# Patient Record
Sex: Female | Born: 1955
Health system: Southern US, Community
[De-identification: ages and names within clinical notes are randomized; demographics above are authoritative.]

## PROBLEM LIST (undated history)

## (undated) DIAGNOSIS — T4145XA Adverse effect of unspecified anesthetic, initial encounter: Secondary | ICD-10-CM

## (undated) DIAGNOSIS — M549 Dorsalgia, unspecified: Secondary | ICD-10-CM

## (undated) DIAGNOSIS — E669 Obesity, unspecified: Secondary | ICD-10-CM

## (undated) DIAGNOSIS — J40 Bronchitis, not specified as acute or chronic: Secondary | ICD-10-CM

## (undated) DIAGNOSIS — G629 Polyneuropathy, unspecified: Secondary | ICD-10-CM

## (undated) DIAGNOSIS — I10 Essential (primary) hypertension: Secondary | ICD-10-CM

## (undated) DIAGNOSIS — Z8739 Personal history of other diseases of the musculoskeletal system and connective tissue: Secondary | ICD-10-CM

## (undated) DIAGNOSIS — G4733 Obstructive sleep apnea (adult) (pediatric): Secondary | ICD-10-CM

## (undated) DIAGNOSIS — E785 Hyperlipidemia, unspecified: Secondary | ICD-10-CM

## (undated) DIAGNOSIS — F419 Anxiety disorder, unspecified: Secondary | ICD-10-CM

## (undated) DIAGNOSIS — R42 Dizziness and giddiness: Secondary | ICD-10-CM

## (undated) DIAGNOSIS — R011 Cardiac murmur, unspecified: Secondary | ICD-10-CM

## (undated) HISTORY — PX: ABDOMINAL HYSTERECTOMY: SHX81

## (undated) HISTORY — DX: Dizziness and giddiness: R42

## (undated) HISTORY — DX: Essential (primary) hypertension: I10

## (undated) HISTORY — PX: TONSILLECTOMY: SUR1361

## (undated) HISTORY — DX: Obesity, unspecified: E66.9

## (undated) HISTORY — DX: Obstructive sleep apnea (adult) (pediatric): G47.33

## (undated) HISTORY — PX: DILATION AND CURETTAGE OF UTERUS: SHX78

## (undated) HISTORY — DX: Hyperlipidemia, unspecified: E78.5

## (undated) HISTORY — PX: ADENOIDECTOMY: SUR15

## (undated) HISTORY — PX: CATARACT EXTRACTION: SUR2

## (undated) HISTORY — PX: ENDOMETRIAL ABLATION: SHX621

## (undated) HISTORY — DX: Dorsalgia, unspecified: M54.9

---

## 2000-09-14 ENCOUNTER — Encounter: Payer: Self-pay | Admitting: Obstetrics and Gynecology

## 2000-09-14 ENCOUNTER — Ambulatory Visit (HOSPITAL_COMMUNITY): Admission: RE | Admit: 2000-09-14 | Discharge: 2000-09-14 | Payer: Self-pay | Admitting: Obstetrics and Gynecology

## 2000-09-21 ENCOUNTER — Other Ambulatory Visit: Admission: RE | Admit: 2000-09-21 | Discharge: 2000-09-21 | Payer: Self-pay | Admitting: Obstetrics and Gynecology

## 2001-03-02 ENCOUNTER — Emergency Department (HOSPITAL_COMMUNITY): Admission: EM | Admit: 2001-03-02 | Discharge: 2001-03-03 | Payer: Self-pay | Admitting: Internal Medicine

## 2001-05-30 ENCOUNTER — Ambulatory Visit (HOSPITAL_BASED_OUTPATIENT_CLINIC_OR_DEPARTMENT_OTHER): Admission: RE | Admit: 2001-05-30 | Discharge: 2001-05-31 | Payer: Self-pay | Admitting: Otolaryngology

## 2001-07-19 ENCOUNTER — Encounter: Payer: Self-pay | Admitting: Otolaryngology

## 2001-07-21 ENCOUNTER — Ambulatory Visit (HOSPITAL_COMMUNITY): Admission: RE | Admit: 2001-07-21 | Discharge: 2001-07-22 | Payer: Self-pay | Admitting: Otolaryngology

## 2001-07-21 ENCOUNTER — Encounter (INDEPENDENT_AMBULATORY_CARE_PROVIDER_SITE_OTHER): Payer: Self-pay | Admitting: *Deleted

## 2001-10-07 ENCOUNTER — Ambulatory Visit (HOSPITAL_COMMUNITY): Admission: RE | Admit: 2001-10-07 | Discharge: 2001-10-07 | Payer: Self-pay | Admitting: Obstetrics and Gynecology

## 2001-10-07 ENCOUNTER — Encounter: Payer: Self-pay | Admitting: Obstetrics and Gynecology

## 2001-12-13 ENCOUNTER — Encounter: Admission: RE | Admit: 2001-12-13 | Discharge: 2002-03-13 | Payer: Self-pay | Admitting: Obstetrics and Gynecology

## 2002-11-24 ENCOUNTER — Encounter: Payer: Self-pay | Admitting: Obstetrics and Gynecology

## 2002-11-24 ENCOUNTER — Ambulatory Visit (HOSPITAL_COMMUNITY): Admission: RE | Admit: 2002-11-24 | Discharge: 2002-11-24 | Payer: Self-pay | Admitting: Obstetrics and Gynecology

## 2003-12-19 ENCOUNTER — Ambulatory Visit (HOSPITAL_COMMUNITY): Admission: RE | Admit: 2003-12-19 | Discharge: 2003-12-19 | Payer: Self-pay | Admitting: Obstetrics and Gynecology

## 2004-12-08 ENCOUNTER — Ambulatory Visit: Payer: Self-pay | Admitting: Family Medicine

## 2004-12-09 ENCOUNTER — Ambulatory Visit (HOSPITAL_COMMUNITY): Admission: RE | Admit: 2004-12-09 | Discharge: 2004-12-09 | Payer: Self-pay | Admitting: Family Medicine

## 2004-12-09 IMAGING — CR DG CHEST 2V
2 series · 2 of 2 positions shown · non-contrast
Comparison: none

HISTORY: Chronic cough, congestion, recent diagnosis hypertension

CHEST 2 VIEWS:
No prior exam available for comparison
Normal heart size, mediastinal contours, and vascularity.
Lungs clear.
Bones unremarkable.
No pleural effusion or pneumothorax.

[view not recorded (1 of 2)]
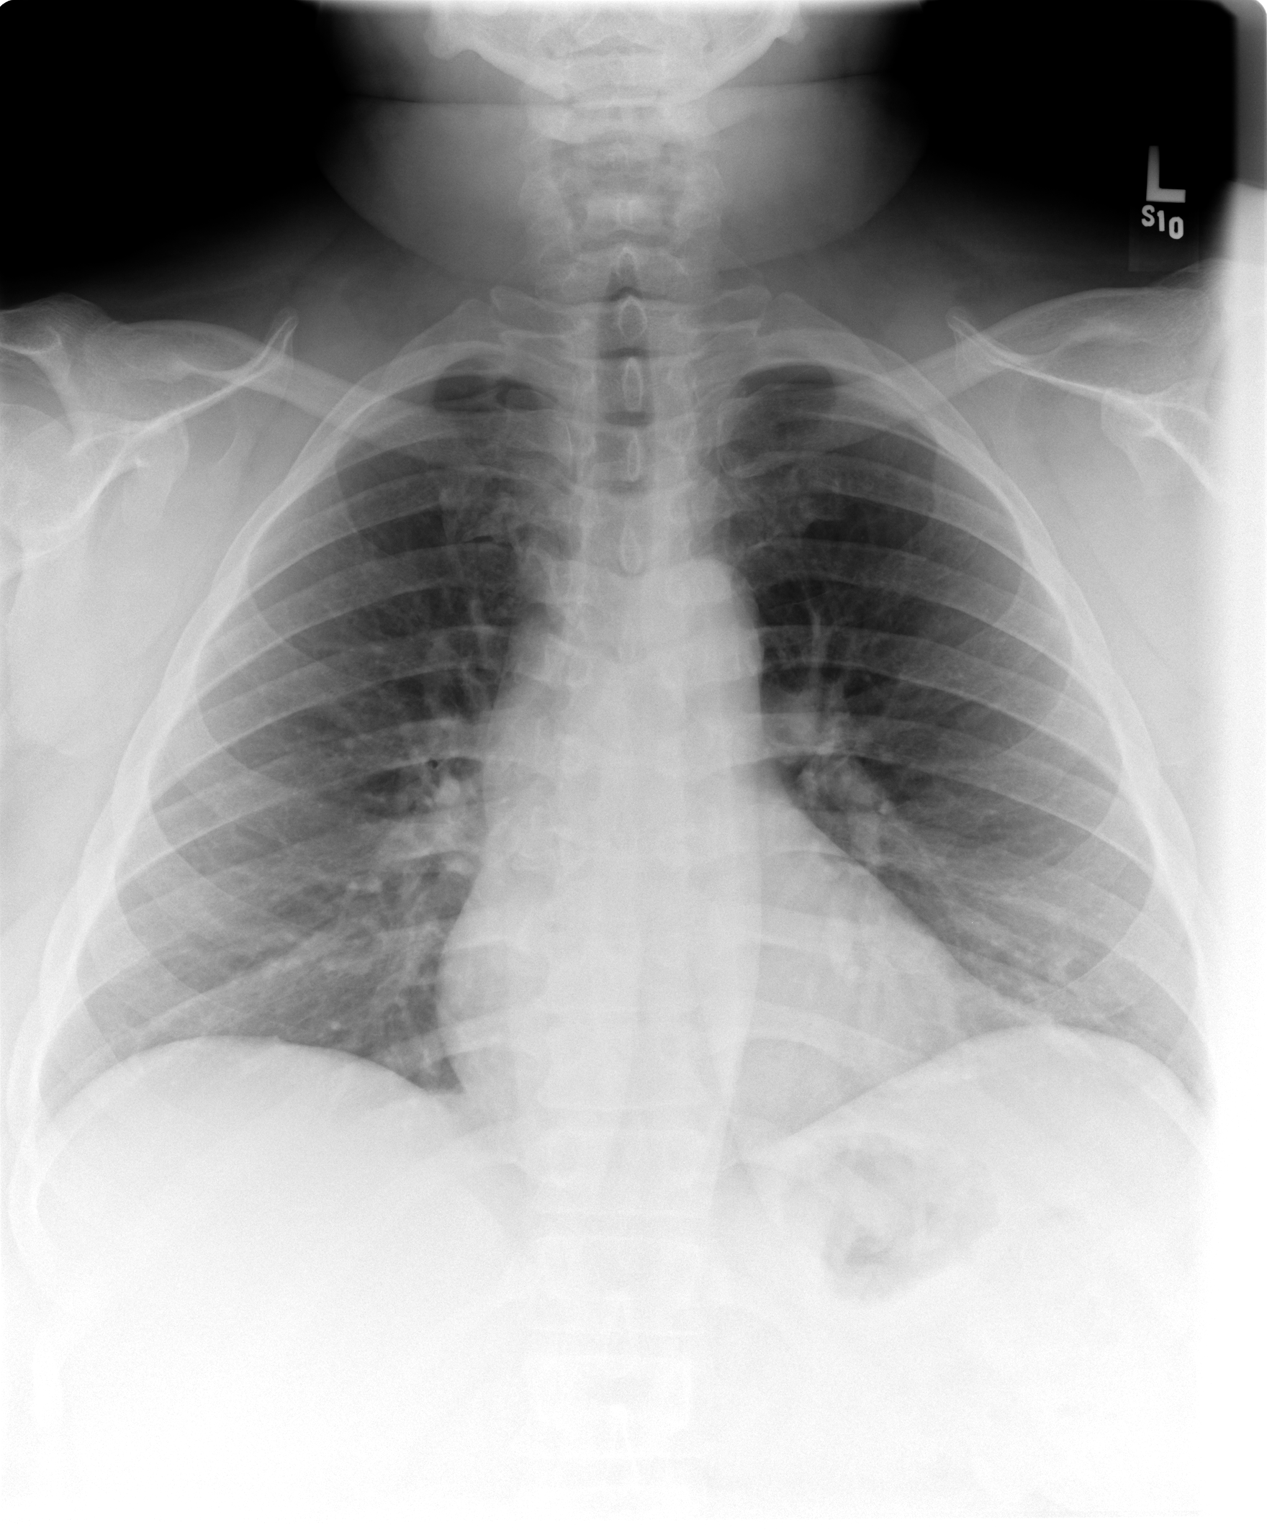

[view not recorded (2 of 2)]
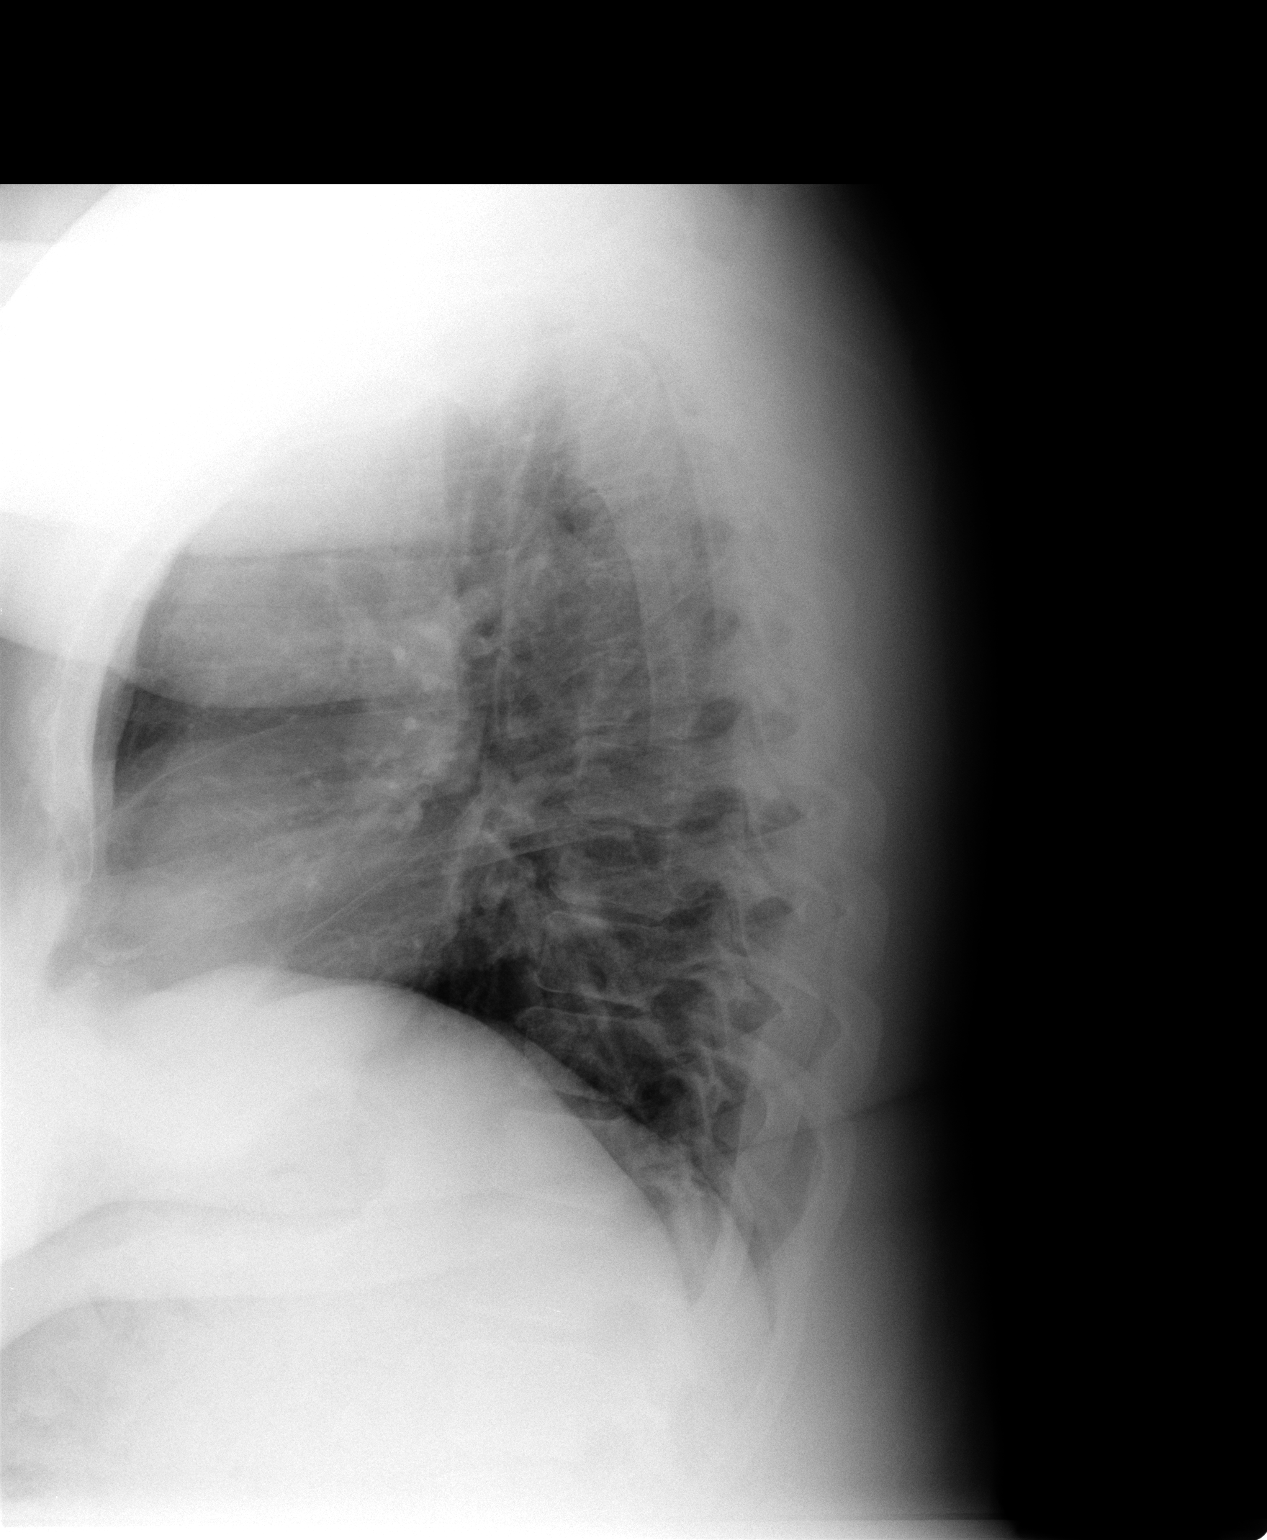

[2 of 2 positions shown; findings below may reference images not displayed]

IMPRESSION: No acute abnormalities.

## 2004-12-17 ENCOUNTER — Ambulatory Visit: Payer: Self-pay | Admitting: *Deleted

## 2004-12-19 ENCOUNTER — Encounter (HOSPITAL_COMMUNITY): Admission: RE | Admit: 2004-12-19 | Discharge: 2005-01-18 | Payer: Self-pay | Admitting: *Deleted

## 2004-12-19 ENCOUNTER — Ambulatory Visit: Payer: Self-pay | Admitting: Cardiology

## 2004-12-19 IMAGING — NM NM MYOCAR PERF WALL MOTION
1 series · 6 of 6 positions shown · non-contrast
Comparison: none

CLINICAL DATA: 49-year-old woman with chest pain, EKG abnormalities, and multiple cardiovascular risk factors. 
 STRESS MYOVIEW STUDY:
 RADIONUCLIDE DATA:  Two day rest/stress protocol performed with 20.0/30.0 mCi [75] Myoview. 
 STRESS DATA:  Treadmill exercise to a workload of 10 mets and a heart rate of 159, 94% of age - predicated maximum.  Exercise discontinued due to dyspnea and fatigue; palpitations and lightheadedness also described; no chest pain.  Blood pressure increased from a resting value of 145/80 to 190/80 during exercise and 220/90 in recovery, a mildly hypertensive response.  No important arrhythmias - few PVCs. 
 EKG:  Normal sinus rhythm; leftward axis; delayed R-wave progression - cannot exclude prior septal myocardial infarction; nonspecific T-wave abnormality.  
 STRESS EKG:  Interpretation somewhat impaired by artifact; no significant ST segment depression identified.  
 SCINTIGRAPHIC DATA:  Acquisition notable for moderate breast attenuation.  Left ventricular size was normal.  On tomographic images reconstructed in standard planes, there was a small to at most moderate size area of mildly to moderately decreased tracer uptake in the anteroseptal region.  Comparison to the resting portion of the exam indicated no reversibility.  The gated reconstruction demonstrated normal regional and global LV systolic function as well as normal systolic accentuation of activity throughout.  Estimated ejection fraction exceeded .65.

[Series 1: cr cardiac tc low dose · 6.52mm/px · 6 of 64 frames shown]
[frame 6/64]
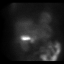
[frame 16/64]
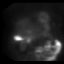
[frame 27/64]
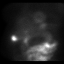
[frame 38/64]
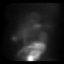
[frame 48/64]
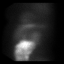
[frame 59/64]
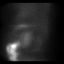

[6 of 6 positions shown; findings below may reference images not displayed]

IMPRESSION: Probably negative stress Myoview study revealing adequate exercise tolerance, no stress - induced angina, no significant stress - induced EKG abnormalities, normal left ventricular size, and normal left ventricular systolic function.  By scintigraphic imaging, there was pronounced breast attenuation, but no convincing evidence for myocardial ischemia or infarction.  Other findings as noted.

## 2004-12-22 ENCOUNTER — Ambulatory Visit: Payer: Self-pay | Admitting: Cardiology

## 2004-12-22 IMAGING — NM NM MYOCAR MULTI W/ SPECT
1 series · 6 of 6 positions shown · non-contrast
Comparison: none

CLINICAL DATA: 49-year-old woman with chest pain, EKG abnormalities, and multiple cardiovascular risk factors. 
 STRESS MYOVIEW STUDY:
 RADIONUCLIDE DATA:  Two day rest/stress protocol performed with 20.0/30.0 mCi [75] Myoview. 
 STRESS DATA:  Treadmill exercise to a workload of 10 mets and a heart rate of 159, 94% of age - predicated maximum.  Exercise discontinued due to dyspnea and fatigue; palpitations and lightheadedness also described; no chest pain.  Blood pressure increased from a resting value of 145/80 to 190/80 during exercise and 220/90 in recovery, a mildly hypertensive response.  No important arrhythmias - few PVCs. 
 EKG:  Normal sinus rhythm; leftward axis; delayed R-wave progression - cannot exclude prior septal myocardial infarction; nonspecific T-wave abnormality.  
 STRESS EKG:  Interpretation somewhat impaired by artifact; no significant ST segment depression identified.  
 SCINTIGRAPHIC DATA:  Acquisition notable for moderate breast attenuation.  Left ventricular size was normal.  On tomographic images reconstructed in standard planes, there was a small to at most moderate size area of mildly to moderately decreased tracer uptake in the anteroseptal region.  Comparison to the resting portion of the exam indicated no reversibility.  The gated reconstruction demonstrated normal regional and global LV systolic function as well as normal systolic accentuation of activity throughout.  Estimated ejection fraction exceeded .65.

[Series 1: cs cardiac tc hi dose · 6.52mm/px · 6 of 497 frames shown]
[frame 42/497  full-range]
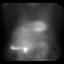
[frame 124/497  full-range]
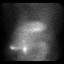
[frame 207/497  full-range]
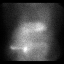
[frame 290/497  full-range]
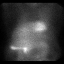
[frame 373/497  full-range]
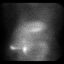
[frame 456/497  full-range]
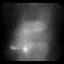

[6 of 6 positions shown; findings below may reference images not displayed]

IMPRESSION: Probably negative stress Myoview study revealing adequate exercise tolerance, no stress - induced angina, no significant stress - induced EKG abnormalities, normal left ventricular size, and normal left ventricular systolic function.  By scintigraphic imaging, there was pronounced breast attenuation, but no convincing evidence for myocardial ischemia or infarction.  Other findings as noted.

## 2004-12-23 ENCOUNTER — Ambulatory Visit: Payer: Self-pay | Admitting: Family Medicine

## 2004-12-23 ENCOUNTER — Ambulatory Visit: Payer: Self-pay | Admitting: *Deleted

## 2004-12-24 ENCOUNTER — Ambulatory Visit (HOSPITAL_COMMUNITY): Admission: RE | Admit: 2004-12-24 | Discharge: 2004-12-24 | Payer: Self-pay | Admitting: Obstetrics and Gynecology

## 2005-01-12 ENCOUNTER — Ambulatory Visit (HOSPITAL_BASED_OUTPATIENT_CLINIC_OR_DEPARTMENT_OTHER): Admission: RE | Admit: 2005-01-12 | Discharge: 2005-01-12 | Payer: Self-pay | Admitting: Internal Medicine

## 2005-01-12 ENCOUNTER — Ambulatory Visit: Payer: Self-pay | Admitting: Internal Medicine

## 2005-01-14 ENCOUNTER — Ambulatory Visit: Payer: Self-pay | Admitting: Family Medicine

## 2005-01-18 ENCOUNTER — Ambulatory Visit: Payer: Self-pay | Admitting: Internal Medicine

## 2005-02-03 ENCOUNTER — Ambulatory Visit: Payer: Self-pay | Admitting: Internal Medicine

## 2005-05-08 ENCOUNTER — Ambulatory Visit: Payer: Self-pay | Admitting: Family Medicine

## 2005-08-05 ENCOUNTER — Ambulatory Visit: Payer: Self-pay | Admitting: Family Medicine

## 2005-09-09 ENCOUNTER — Ambulatory Visit: Payer: Self-pay | Admitting: Family Medicine

## 2005-09-22 ENCOUNTER — Ambulatory Visit: Payer: Self-pay | Admitting: Family Medicine

## 2005-10-26 ENCOUNTER — Ambulatory Visit: Payer: Self-pay | Admitting: Family Medicine

## 2005-12-24 ENCOUNTER — Ambulatory Visit: Payer: Self-pay | Admitting: Family Medicine

## 2005-12-25 ENCOUNTER — Ambulatory Visit (HOSPITAL_COMMUNITY): Admission: RE | Admit: 2005-12-25 | Discharge: 2005-12-25 | Payer: Self-pay | Admitting: Obstetrics and Gynecology

## 2006-02-09 HISTORY — PX: COLONOSCOPY: SHX174

## 2006-03-16 ENCOUNTER — Encounter: Payer: Self-pay | Admitting: Family Medicine

## 2006-03-16 LAB — CONVERTED CEMR LAB
ALT: 11 units/L (ref 0–35)
AST: 14 units/L (ref 0–37)
Albumin: 4.1 g/dL (ref 3.5–5.2)
Alkaline Phosphatase: 71 units/L (ref 39–117)
BUN: 13 mg/dL (ref 6–23)
Bilirubin, Direct: <0.1 mg/dL (ref 0.0–0.3)
CO2: 25 meq/L (ref 19–32)
Calcium: 9.7 mg/dL (ref 8.4–10.5)
Chloride: 104 meq/L (ref 96–112)
Cholesterol: 136 mg/dL (ref 0–200)
Creatinine, Ser: 0.82 mg/dL (ref 0.40–1.20)
Glucose, Bld: 86 mg/dL (ref 70–99)
HDL: 49 mg/dL (ref >39)
Hgb A1c MFr Bld: 6.4 % — ABNORMAL HIGH (ref 4.6–6.1)
LDL Cholesterol: 68 mg/dL (ref 0–99)
Potassium: 4.2 meq/L (ref 3.5–5.3)
Sodium: 139 meq/L (ref 135–145)
Total Bilirubin: 0.4 mg/dL (ref 0.3–1.2)
Total CHOL/HDL Ratio: 2.8
Total Protein: 7.6 g/dL (ref 6.0–8.3)
Triglycerides: 93 mg/dL (ref <150)
VLDL: 19 mg/dL (ref 0–40)

## 2006-03-26 ENCOUNTER — Ambulatory Visit: Payer: Self-pay | Admitting: Family Medicine

## 2006-03-26 LAB — CONVERTED CEMR LAB: Pap Smear: NORMAL

## 2006-05-07 ENCOUNTER — Ambulatory Visit: Payer: Self-pay | Admitting: Family Medicine

## 2006-05-08 ENCOUNTER — Encounter: Payer: Self-pay | Admitting: Family Medicine

## 2006-05-08 LAB — CONVERTED CEMR LAB: Microalb, Ur: 0.79 mg/dL (ref 0.00–1.89)

## 2006-06-28 ENCOUNTER — Ambulatory Visit: Payer: Self-pay | Admitting: Family Medicine

## 2006-09-27 ENCOUNTER — Ambulatory Visit (HOSPITAL_COMMUNITY): Admission: RE | Admit: 2006-09-27 | Discharge: 2006-09-27 | Payer: Self-pay | Admitting: Internal Medicine

## 2006-09-27 ENCOUNTER — Encounter: Payer: Self-pay | Admitting: Internal Medicine

## 2006-09-27 ENCOUNTER — Ambulatory Visit: Payer: Self-pay | Admitting: Internal Medicine

## 2006-10-07 ENCOUNTER — Encounter: Payer: Self-pay | Admitting: Family Medicine

## 2006-10-07 LAB — CONVERTED CEMR LAB
ALT: 16 units/L (ref 0–35)
AST: 16 units/L (ref 0–37)
Albumin: 4.2 g/dL (ref 3.5–5.2)
Alkaline Phosphatase: 75 units/L (ref 39–117)
BUN: 12 mg/dL (ref 6–23)
Basophils Absolute: 0.1 10*3/uL (ref 0.0–0.1)
Basophils Relative: 1 % (ref 0–1)
Bilirubin, Direct: <0.1 mg/dL (ref 0.0–0.3)
CO2: 26 meq/L (ref 19–32)
Calcium: 10.4 mg/dL (ref 8.4–10.5)
Chloride: 104 meq/L (ref 96–112)
Cholesterol: 177 mg/dL (ref 0–200)
Creatinine, Ser: 0.87 mg/dL (ref 0.40–1.20)
Eosinophils Absolute: 0.2 10*3/uL (ref 0.0–0.7)
Eosinophils Relative: 2 % (ref 0–5)
Glucose, Bld: 93 mg/dL (ref 70–99)
HCT: 40.1 % (ref 36.0–46.0)
HDL: 51 mg/dL (ref >39)
Hemoglobin: 12.4 g/dL (ref 12.0–15.0)
LDL Cholesterol: 108 mg/dL — ABNORMAL HIGH (ref 0–99)
Lymphocytes Relative: 36 % (ref 12–46)
Lymphs Abs: 3.9 10*3/uL — ABNORMAL HIGH (ref 0.7–3.3)
MCHC: 30.9 g/dL (ref 30.0–36.0)
MCV: 67.9 fL — ABNORMAL LOW (ref 78.0–100.0)
Monocytes Absolute: 0.8 10*3/uL — ABNORMAL HIGH (ref 0.2–0.7)
Monocytes Relative: 7 % (ref 3–11)
Neutro Abs: 5.8 10*3/uL (ref 1.7–7.7)
Neutrophils Relative %: 54 % (ref 43–77)
Platelets: 347 10*3/uL (ref 150–400)
Potassium: 4.4 meq/L (ref 3.5–5.3)
RBC: 5.91 M/uL — ABNORMAL HIGH (ref 3.87–5.11)
RDW: 17.3 % — ABNORMAL HIGH (ref 11.5–14.0)
Sodium: 141 meq/L (ref 135–145)
Total Bilirubin: 0.3 mg/dL (ref 0.3–1.2)
Total CHOL/HDL Ratio: 3.5
Total Protein: 7.8 g/dL (ref 6.0–8.3)
Triglycerides: 10 mg/dL (ref <150)
VLDL: 18 mg/dL (ref 0–40)
WBC: 10.8 10*3/uL — ABNORMAL HIGH (ref 4.0–10.5)

## 2006-10-12 ENCOUNTER — Ambulatory Visit: Payer: Self-pay | Admitting: Family Medicine

## 2006-11-18 ENCOUNTER — Ambulatory Visit: Payer: Self-pay | Admitting: Family Medicine

## 2006-12-27 ENCOUNTER — Ambulatory Visit (HOSPITAL_COMMUNITY): Admission: RE | Admit: 2006-12-27 | Discharge: 2006-12-27 | Payer: Self-pay | Admitting: Obstetrics and Gynecology

## 2007-01-12 ENCOUNTER — Emergency Department (HOSPITAL_COMMUNITY): Admission: EM | Admit: 2007-01-12 | Discharge: 2007-01-12 | Payer: Self-pay | Admitting: Emergency Medicine
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
## 2007-01-18 ENCOUNTER — Ambulatory Visit (HOSPITAL_COMMUNITY): Admission: RE | Admit: 2007-01-18 | Discharge: 2007-01-18 | Payer: Self-pay | Admitting: Family Medicine

## 2007-01-18 ENCOUNTER — Ambulatory Visit: Payer: Self-pay | Admitting: Family Medicine
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
## 2007-02-07 ENCOUNTER — Other Ambulatory Visit: Admission: RE | Admit: 2007-02-07 | Discharge: 2007-02-07 | Payer: Self-pay | Admitting: Obstetrics and Gynecology

## 2007-02-07 ENCOUNTER — Encounter: Payer: Self-pay | Admitting: Family Medicine

## 2007-02-07 LAB — CONVERTED CEMR LAB: Pap Smear: NORMAL

## 2007-02-10 ENCOUNTER — Encounter: Payer: Self-pay | Admitting: Family Medicine

## 2007-02-22 ENCOUNTER — Ambulatory Visit: Payer: Self-pay | Admitting: Family Medicine

## 2007-04-20 DIAGNOSIS — G4733 Obstructive sleep apnea (adult) (pediatric): Secondary | ICD-10-CM | POA: Insufficient documentation

## 2007-04-21 ENCOUNTER — Ambulatory Visit: Payer: Self-pay | Admitting: Internal Medicine

## 2007-05-04 ENCOUNTER — Telehealth (INDEPENDENT_AMBULATORY_CARE_PROVIDER_SITE_OTHER): Payer: Self-pay | Admitting: *Deleted

## 2007-05-20 ENCOUNTER — Encounter: Payer: Self-pay | Admitting: Family Medicine

## 2007-05-20 LAB — CONVERTED CEMR LAB
ALT: 15 units/L (ref 0–35)
AST: 15 units/L (ref 0–37)
Albumin: 4.3 g/dL (ref 3.5–5.2)
Alkaline Phosphatase: 70 units/L (ref 39–117)
BUN: 16 mg/dL (ref 6–23)
Basophils Absolute: 0 10*3/uL (ref 0.0–0.1)
Basophils Relative: 0 % (ref 0–1)
Bilirubin, Direct: 0.1 mg/dL (ref 0.0–0.3)
CO2: 26 meq/L (ref 19–32)
Calcium: 10.3 mg/dL (ref 8.4–10.5)
Chloride: 103 meq/L (ref 96–112)
Cholesterol: 161 mg/dL (ref 0–200)
Creatinine, Ser: 0.94 mg/dL (ref 0.40–1.20)
Eosinophils Absolute: 0.2 10*3/uL (ref 0.0–0.7)
Eosinophils Relative: 2 % (ref 0–5)
Glucose, Bld: 92 mg/dL (ref 70–99)
HCT: 36.7 % (ref 36.0–46.0)
HDL: 52 mg/dL (ref >39)
Hemoglobin: 11.5 g/dL — ABNORMAL LOW (ref 12.0–15.0)
Indirect Bilirubin: 0.4 mg/dL (ref 0.0–0.9)
LDL Cholesterol: 95 mg/dL (ref 0–99)
Lymphocytes Relative: 31 % (ref 12–46)
Lymphs Abs: 2.9 10*3/uL (ref 0.7–4.0)
MCHC: 31.3 g/dL (ref 30.0–36.0)
MCV: 69.6 fL — ABNORMAL LOW (ref 78.0–100.0)
Monocytes Absolute: 0.8 10*3/uL (ref 0.1–1.0)
Monocytes Relative: 8 % (ref 3–12)
Neutro Abs: 5.6 10*3/uL (ref 1.7–7.7)
Neutrophils Relative %: 59 % (ref 43–77)
Platelets: 326 10*3/uL (ref 150–400)
Potassium: 4.6 meq/L (ref 3.5–5.3)
RBC: 5.27 M/uL — ABNORMAL HIGH (ref 3.87–5.11)
RDW: 17.4 % — ABNORMAL HIGH (ref 11.5–15.5)
Sodium: 141 meq/L (ref 135–145)
TSH: 1.158 microintl units/mL (ref 0.350–5.50)
Total Bilirubin: 0.5 mg/dL (ref 0.3–1.2)
Total CHOL/HDL Ratio: 3.1
Total Protein: 8.1 g/dL (ref 6.0–8.3)
Triglycerides: 68 mg/dL (ref <150)
VLDL: 14 mg/dL (ref 0–40)
WBC: 9.5 10*3/uL (ref 4.0–10.5)

## 2007-05-26 ENCOUNTER — Ambulatory Visit: Payer: Self-pay | Admitting: Family Medicine

## 2007-05-27 ENCOUNTER — Encounter: Payer: Self-pay | Admitting: Family Medicine

## 2007-05-27 ENCOUNTER — Encounter: Payer: Self-pay | Admitting: Internal Medicine

## 2007-05-27 LAB — CONVERTED CEMR LAB: Microalb, Ur: 0.33 mg/dL (ref 0.00–1.89)

## 2007-05-30 ENCOUNTER — Encounter: Payer: Self-pay | Admitting: Internal Medicine

## 2007-06-03 ENCOUNTER — Ambulatory Visit: Payer: Self-pay | Admitting: Internal Medicine

## 2007-07-18 ENCOUNTER — Encounter: Payer: Self-pay | Admitting: Internal Medicine

## 2007-08-26 ENCOUNTER — Encounter: Payer: Self-pay | Admitting: Family Medicine

## 2007-08-26 DIAGNOSIS — E785 Hyperlipidemia, unspecified: Secondary | ICD-10-CM | POA: Insufficient documentation

## 2007-08-26 DIAGNOSIS — I10 Essential (primary) hypertension: Secondary | ICD-10-CM | POA: Insufficient documentation

## 2007-09-22 ENCOUNTER — Encounter: Payer: Self-pay | Admitting: Family Medicine

## 2007-09-22 LAB — CONVERTED CEMR LAB: Retic Ct Pct: 1.2 % (ref 0.4–3.1)

## 2007-09-26 ENCOUNTER — Ambulatory Visit: Payer: Self-pay | Admitting: Family Medicine

## 2007-09-26 LAB — CONVERTED CEMR LAB
Glucose, Bld: 109 mg/dL
Hgb A1c MFr Bld: 6.4 %

## 2007-09-27 ENCOUNTER — Telehealth: Payer: Self-pay | Admitting: Family Medicine

## 2007-10-04 ENCOUNTER — Encounter: Payer: Self-pay | Admitting: Family Medicine

## 2007-11-08 ENCOUNTER — Telehealth: Payer: Self-pay | Admitting: Family Medicine

## 2007-11-09 ENCOUNTER — Ambulatory Visit: Payer: Self-pay | Admitting: Family Medicine

## 2007-11-09 DIAGNOSIS — IMO0002 Reserved for concepts with insufficient information to code with codable children: Secondary | ICD-10-CM | POA: Insufficient documentation

## 2007-11-09 LAB — CONVERTED CEMR LAB: Blood Glucose, Fasting: 132 mg/dL

## 2008-01-04 ENCOUNTER — Ambulatory Visit (HOSPITAL_COMMUNITY): Admission: RE | Admit: 2008-01-04 | Discharge: 2008-01-04 | Payer: Self-pay | Admitting: Obstetrics and Gynecology

## 2008-01-25 ENCOUNTER — Ambulatory Visit: Payer: Self-pay | Admitting: Family Medicine

## 2008-01-25 DIAGNOSIS — E119 Type 2 diabetes mellitus without complications: Secondary | ICD-10-CM | POA: Insufficient documentation

## 2008-01-25 LAB — CONVERTED CEMR LAB
ALT: 17 units/L (ref 0–35)
AST: 15 units/L (ref 0–37)
Albumin: 4.5 g/dL (ref 3.5–5.2)
Alkaline Phosphatase: 79 units/L (ref 39–117)
BUN: 16 mg/dL (ref 6–23)
Bilirubin, Direct: 0.1 mg/dL (ref 0.0–0.3)
Blood Glucose, Fasting: 114 mg/dL
CO2: 24 meq/L (ref 19–32)
Calcium: 10.3 mg/dL (ref 8.4–10.5)
Chloride: 101 meq/L (ref 96–112)
Cholesterol: 177 mg/dL (ref 0–200)
Creatinine, Ser: 0.93 mg/dL (ref 0.40–1.20)
Glucose, Bld: 103 mg/dL — ABNORMAL HIGH (ref 70–99)
HDL: 49 mg/dL (ref >39)
Indirect Bilirubin: 0.2 mg/dL (ref 0.0–0.9)
LDL Cholesterol: 103 mg/dL — ABNORMAL HIGH (ref 0–99)
Potassium: 4.5 meq/L (ref 3.5–5.3)
Sodium: 140 meq/L (ref 135–145)
Total Bilirubin: 0.3 mg/dL (ref 0.3–1.2)
Total CHOL/HDL Ratio: 3.6
Total Protein: 8 g/dL (ref 6.0–8.3)
Triglycerides: 125 mg/dL (ref <150)
VLDL: 25 mg/dL (ref 0–40)

## 2008-01-26 ENCOUNTER — Ambulatory Visit (HOSPITAL_COMMUNITY): Admission: RE | Admit: 2008-01-26 | Discharge: 2008-01-26 | Payer: Self-pay | Admitting: Family Medicine

## 2008-01-26 ENCOUNTER — Encounter: Payer: Self-pay | Admitting: Family Medicine

## 2008-02-06 ENCOUNTER — Encounter: Payer: Self-pay | Admitting: Family Medicine

## 2008-05-14 ENCOUNTER — Other Ambulatory Visit: Admission: RE | Admit: 2008-05-14 | Discharge: 2008-05-14 | Payer: Self-pay | Admitting: Obstetrics and Gynecology

## 2008-05-14 ENCOUNTER — Encounter: Payer: Self-pay | Admitting: Family Medicine

## 2008-06-11 ENCOUNTER — Ambulatory Visit: Payer: Self-pay | Admitting: Family Medicine

## 2008-06-11 LAB — CONVERTED CEMR LAB
Glucose, Bld: 167 mg/dL
Hgb A1c MFr Bld: 6.5 %

## 2008-06-12 ENCOUNTER — Encounter: Payer: Self-pay | Admitting: Family Medicine

## 2008-06-12 LAB — CONVERTED CEMR LAB
Creatinine, Urine: 184.6 mg/dL
Microalb Creat Ratio: 4.6 mg/g (ref 0.0–30.0)
Microalb, Ur: 0.84 mg/dL (ref 0.00–1.89)

## 2008-08-09 ENCOUNTER — Telehealth: Payer: Self-pay | Admitting: Family Medicine

## 2008-08-10 ENCOUNTER — Telehealth: Payer: Self-pay | Admitting: Family Medicine

## 2008-08-14 ENCOUNTER — Encounter: Payer: Self-pay | Admitting: Family Medicine

## 2008-08-24 ENCOUNTER — Ambulatory Visit: Payer: Self-pay | Admitting: Family Medicine

## 2008-09-13 ENCOUNTER — Telehealth: Payer: Self-pay | Admitting: Family Medicine

## 2008-10-22 ENCOUNTER — Encounter: Payer: Self-pay | Admitting: Family Medicine

## 2008-10-23 ENCOUNTER — Ambulatory Visit: Payer: Self-pay | Admitting: Family Medicine

## 2008-10-23 ENCOUNTER — Ambulatory Visit (HOSPITAL_COMMUNITY): Admission: RE | Admit: 2008-10-23 | Discharge: 2008-10-23 | Payer: Self-pay | Admitting: Family Medicine

## 2008-10-23 LAB — CONVERTED CEMR LAB
ALT: 14 units/L (ref 0–35)
AST: 13 units/L (ref 0–37)
Albumin: 4.2 g/dL (ref 3.5–5.2)
Alkaline Phosphatase: 74 units/L (ref 39–117)
BUN: 22 mg/dL (ref 6–23)
Bilirubin, Direct: 0.1 mg/dL (ref 0.0–0.3)
Blood Glucose, Fasting: 111 mg/dL
CO2: 25 meq/L (ref 19–32)
Calcium: 10.2 mg/dL (ref 8.4–10.5)
Chloride: 102 meq/L (ref 96–112)
Cholesterol: 234 mg/dL — ABNORMAL HIGH (ref 0–200)
Creatinine, Ser: 0.95 mg/dL (ref 0.40–1.20)
Glucose, Bld: 99 mg/dL (ref 70–99)
HDL: 51 mg/dL (ref >39)
Indirect Bilirubin: 0.2 mg/dL (ref 0.0–0.9)
LDL Cholesterol: 147 mg/dL — ABNORMAL HIGH (ref 0–99)
Potassium: 4.5 meq/L (ref 3.5–5.3)
Sodium: 140 meq/L (ref 135–145)
Total Bilirubin: 0.3 mg/dL (ref 0.3–1.2)
Total CHOL/HDL Ratio: 4.6
Total Protein: 7.8 g/dL (ref 6.0–8.3)
Triglycerides: 181 mg/dL — ABNORMAL HIGH (ref <150)
VLDL: 36 mg/dL (ref 0–40)

## 2008-10-23 IMAGING — CR DG CHEST 2V
2 series · 2 of 2 positions shown · non-contrast
Comparison: [DATE]

CLINICAL DATA: Chronic bronchitis

CHEST - 2 VIEW

[view not recorded (1 of 2)]
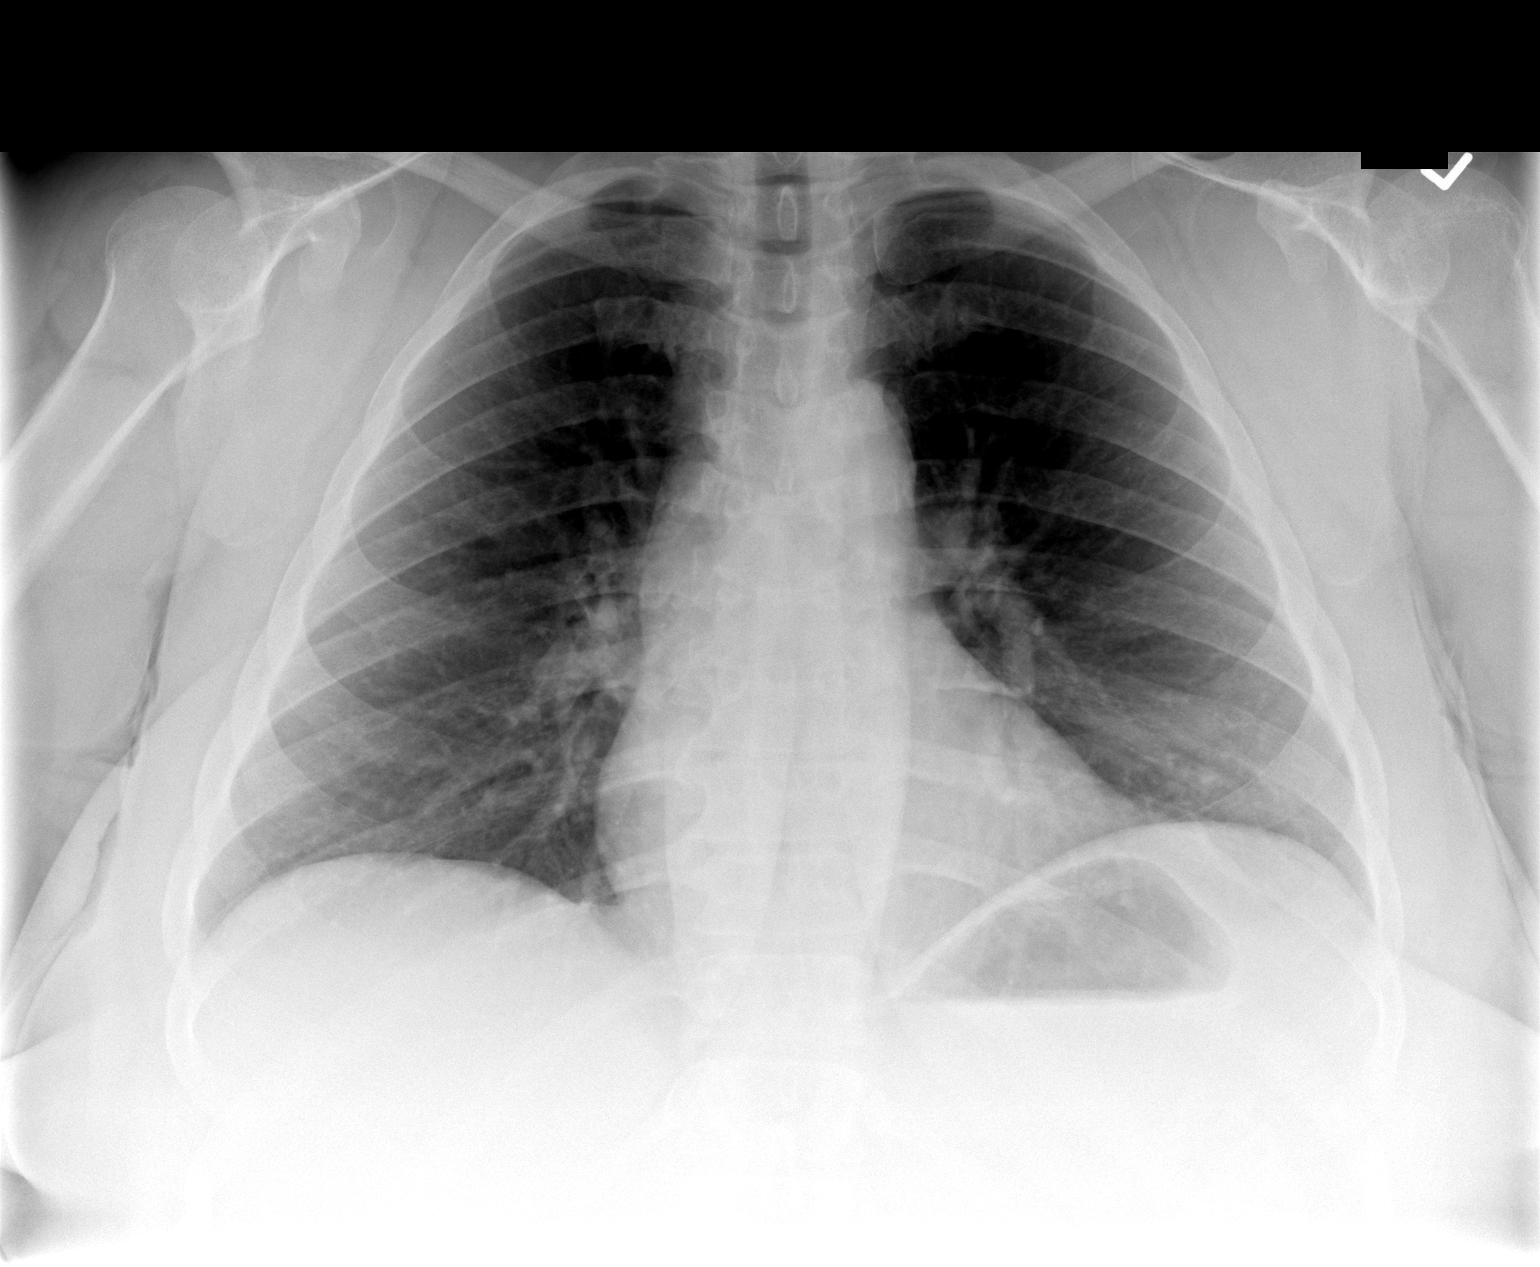

[view not recorded (2 of 2)]
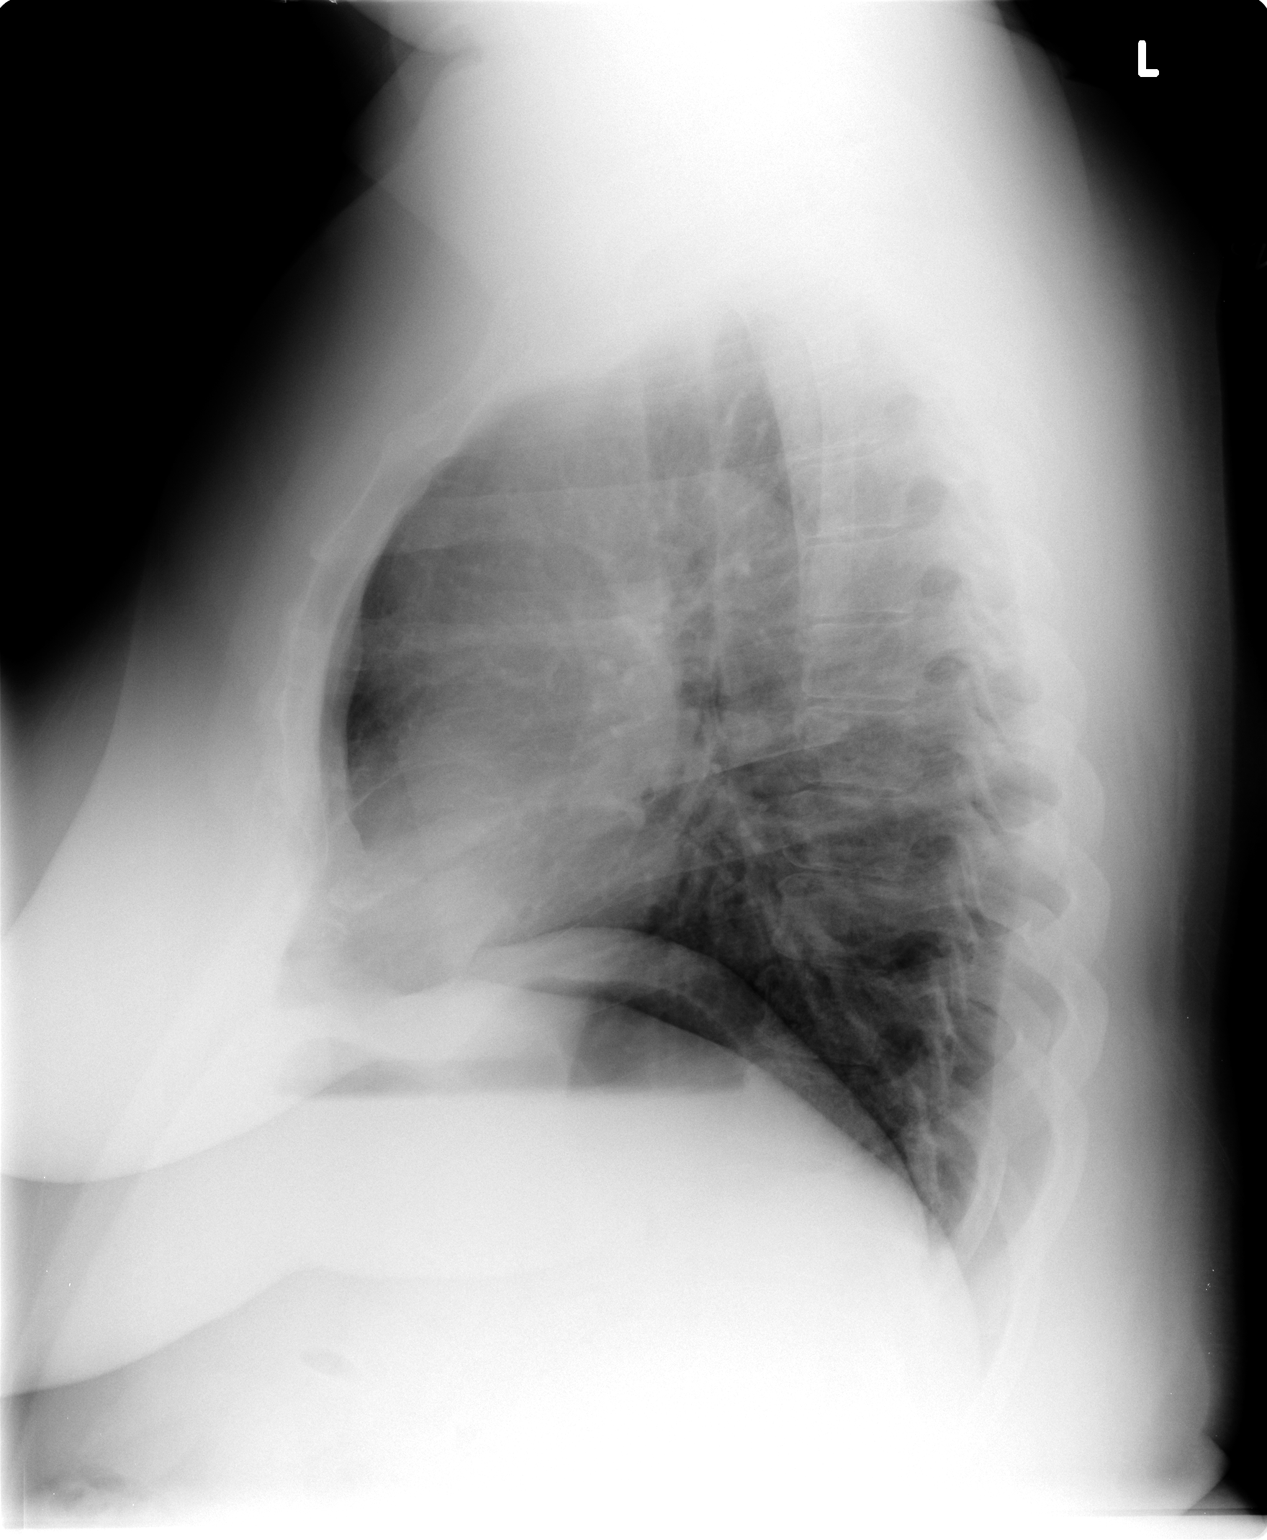

[2 of 2 positions shown; findings below may reference images not displayed]

FINDINGS: Normal heart size and clear lungs.
IMPRESSION: No active cardiopulmonary disease.

## 2008-10-25 ENCOUNTER — Encounter: Payer: Self-pay | Admitting: Family Medicine

## 2008-10-26 ENCOUNTER — Ambulatory Visit (HOSPITAL_COMMUNITY): Admission: RE | Admit: 2008-10-26 | Discharge: 2008-10-26 | Payer: Self-pay | Admitting: Family Medicine
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
## 2008-10-30 ENCOUNTER — Ambulatory Visit (HOSPITAL_COMMUNITY): Admission: RE | Admit: 2008-10-30 | Discharge: 2008-10-30 | Payer: Self-pay | Admitting: Family Medicine

## 2008-10-30 ENCOUNTER — Encounter: Payer: Self-pay | Admitting: Family Medicine

## 2008-11-05 ENCOUNTER — Telehealth: Payer: Self-pay | Admitting: Family Medicine

## 2008-11-22 ENCOUNTER — Ambulatory Visit: Payer: Self-pay | Admitting: Family Medicine

## 2008-11-22 DIAGNOSIS — R5381 Other malaise: Secondary | ICD-10-CM | POA: Insufficient documentation

## 2008-11-22 DIAGNOSIS — R5383 Other fatigue: Secondary | ICD-10-CM

## 2008-11-22 LAB — CONVERTED CEMR LAB
Glucose, Bld: 104 mg/dL
Hgb A1c MFr Bld: 6.6 %

## 2009-01-07 ENCOUNTER — Telehealth: Payer: Self-pay | Admitting: Family Medicine

## 2009-01-25 ENCOUNTER — Ambulatory Visit (HOSPITAL_COMMUNITY): Admission: RE | Admit: 2009-01-25 | Discharge: 2009-01-25 | Payer: Self-pay | Admitting: Family Medicine

## 2009-01-25 IMAGING — MG MM DIGITAL SCREENING
6 series · 6 of 6 positions shown · non-contrast
Comparison: none

DG SCREEN MAMMOGRAM BILATERAL
Bilateral CC and MLO view(s) were taken.
Technologist: [REDACTED]

DIGITAL SCREENING MAMMOGRAM WITH CAD:
There are scattered fibroglandular densities.  No masses or malignant type calcifications are 
identified.  Compared with prior studies.
Images were processed with CAD.

[L CC (1 of 2)]
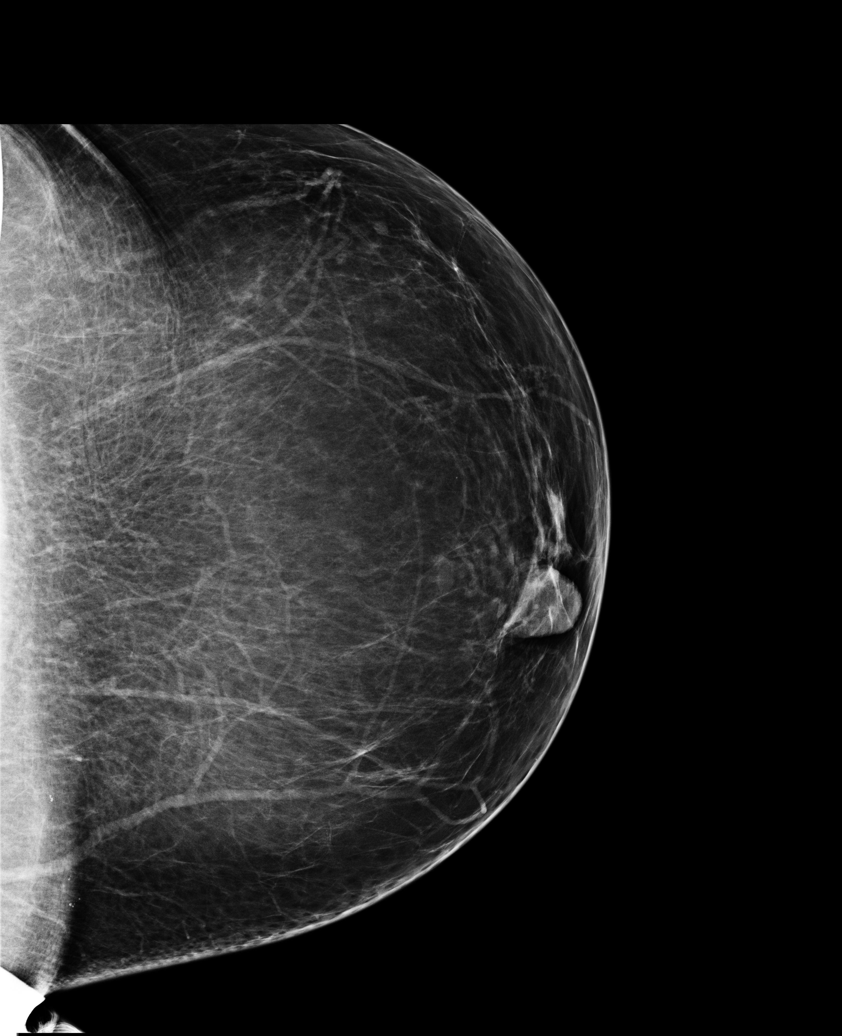

[L MLO (1 of 2)]
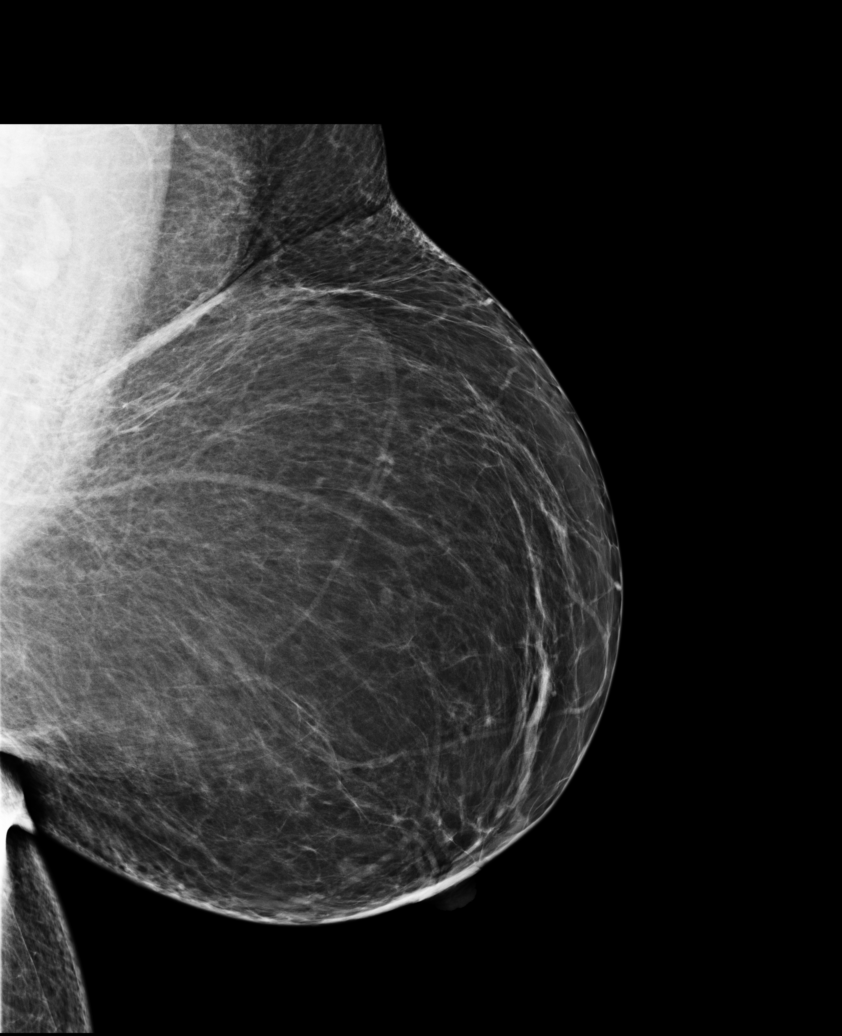

[R CC]
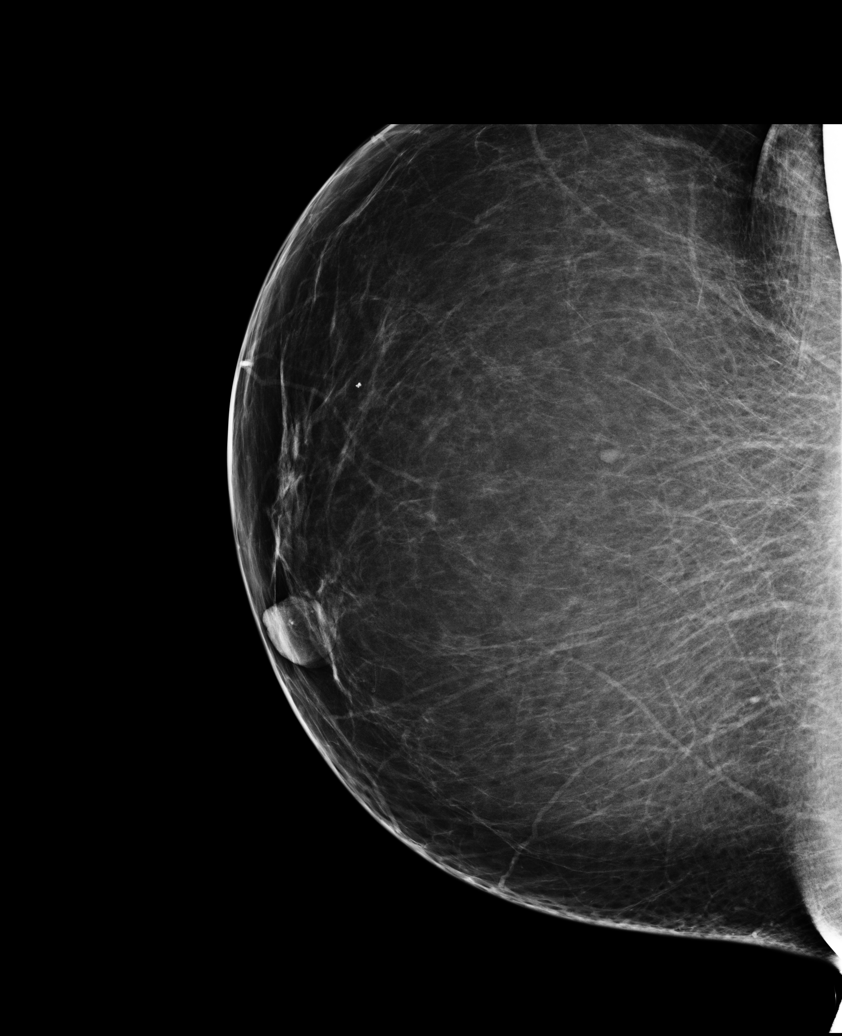

[R MLO]
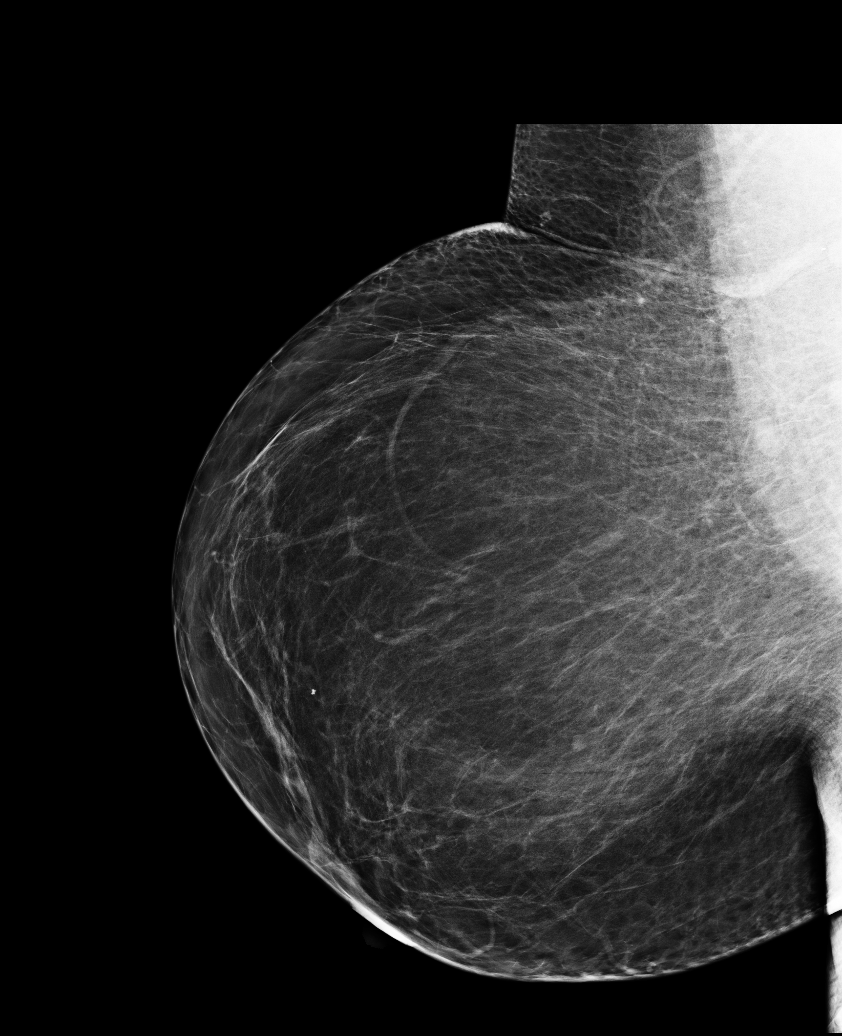

[L CC (2 of 2)]
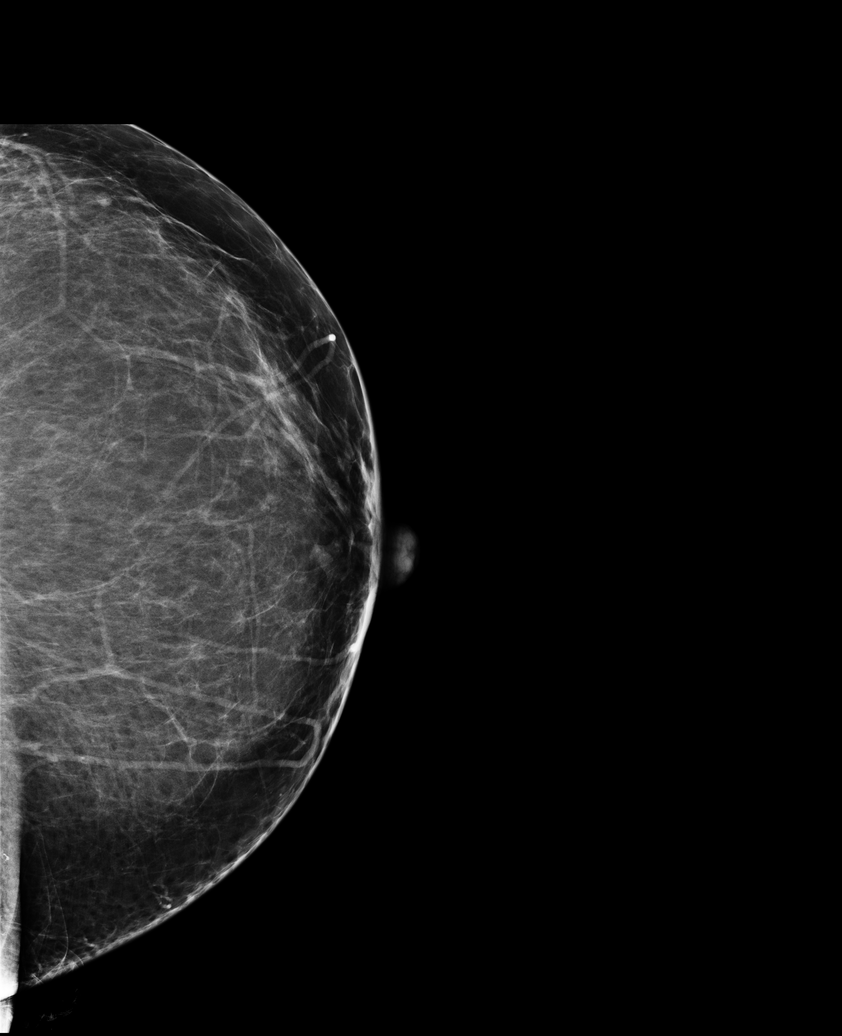

[L MLO (2 of 2)]
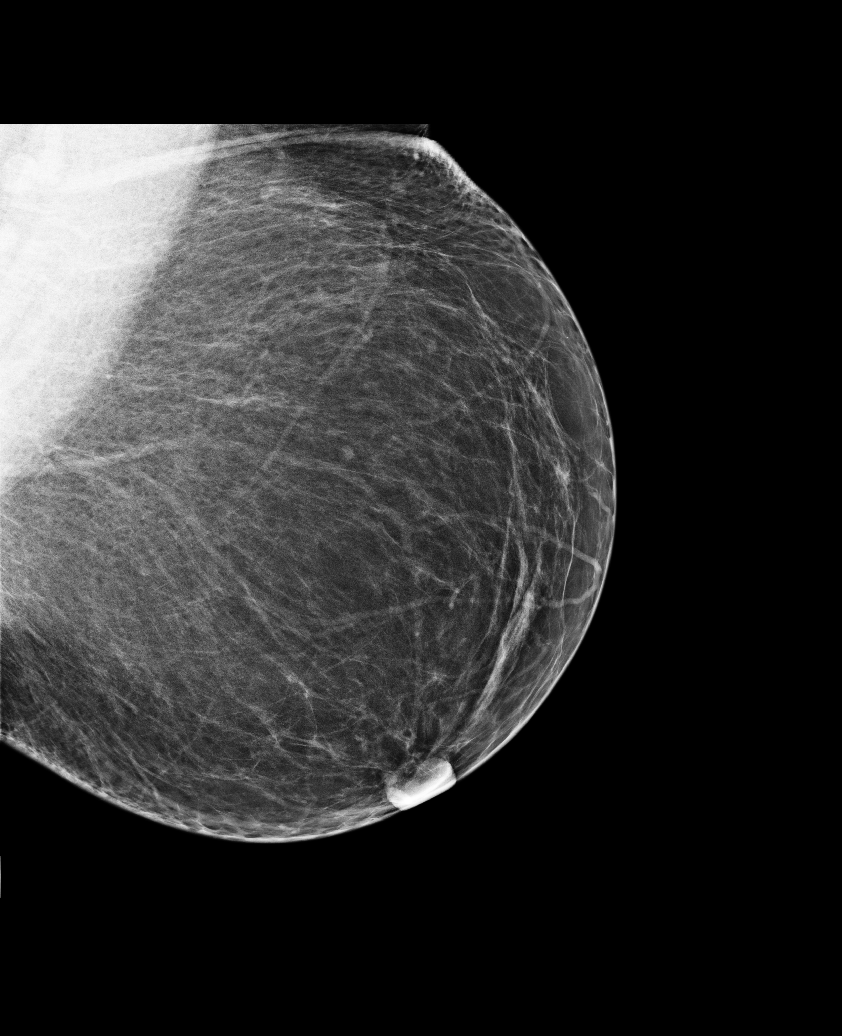

[6 of 6 positions shown; findings below may reference images not displayed]

IMPRESSION: No specific mammographic evidence of malignancy.  Next screening mammogram is recommended in one 
year.

A result letter of this screening mammogram will be mailed directly to the patient.

ASSESSMENT: Negative - BI-RADS 1

Screening mammogram in 1 year.
,

## 2009-02-22 ENCOUNTER — Ambulatory Visit: Payer: Self-pay | Admitting: Family Medicine

## 2009-02-22 DIAGNOSIS — B351 Tinea unguium: Secondary | ICD-10-CM | POA: Insufficient documentation

## 2009-02-22 DIAGNOSIS — D239 Other benign neoplasm of skin, unspecified: Secondary | ICD-10-CM | POA: Insufficient documentation

## 2009-02-22 LAB — CONVERTED CEMR LAB
Glucose, Bld: 116 mg/dL
Hgb A1c MFr Bld: 6.5 %

## 2009-02-25 ENCOUNTER — Telehealth: Payer: Self-pay | Admitting: Family Medicine

## 2009-03-08 LAB — CONVERTED CEMR LAB
BUN: 16 mg/dL (ref 6–23)
CO2: 24 meq/L (ref 19–32)
Calcium: 10.5 mg/dL (ref 8.4–10.5)
Chloride: 102 meq/L (ref 96–112)
Cholesterol: 169 mg/dL (ref 0–200)
Creatinine, Ser: 1.01 mg/dL (ref 0.40–1.20)
EBV NA IgG: 2.74 — ABNORMAL HIGH
EBV VCA IgG: 7.43 — ABNORMAL HIGH
EBV VCA IgM: 0.08
Glucose, Bld: 96 mg/dL (ref 70–99)
HCT: 35.7 % — ABNORMAL LOW (ref 36.0–46.0)
HDL: 46 mg/dL (ref >39)
Hemoglobin: 11.5 g/dL — ABNORMAL LOW (ref 12.0–15.0)
LDL Cholesterol: 97 mg/dL (ref 0–99)
MCHC: 32.2 g/dL (ref 30.0–36.0)
MCV: 67 fL — ABNORMAL LOW (ref 78.0–100.0)
Platelets: 358 10*3/uL (ref 150–400)
Potassium: 4.7 meq/L (ref 3.5–5.3)
RBC: 5.33 M/uL — ABNORMAL HIGH (ref 3.87–5.11)
RDW: 16.6 % — ABNORMAL HIGH (ref 11.5–15.5)
Sodium: 139 meq/L (ref 135–145)
TSH: 2.081 microintl units/mL (ref 0.350–4.500)
Total CHOL/HDL Ratio: 3.7
Triglycerides: 131 mg/dL (ref <150)
VLDL: 26 mg/dL (ref 0–40)
WBC: 10.9 10*3/uL — ABNORMAL HIGH (ref 4.0–10.5)

## 2009-07-02 ENCOUNTER — Ambulatory Visit: Payer: Self-pay | Admitting: Family Medicine

## 2009-07-10 ENCOUNTER — Encounter: Payer: Self-pay | Admitting: Family Medicine

## 2009-07-12 ENCOUNTER — Telehealth: Payer: Self-pay | Admitting: Physician Assistant

## 2009-07-24 ENCOUNTER — Telehealth: Payer: Self-pay | Admitting: Family Medicine

## 2009-07-24 LAB — CONVERTED CEMR LAB
ALT: 13 units/L (ref 0–35)
AST: 15 units/L (ref 0–37)
Albumin: 4.2 g/dL (ref 3.5–5.2)
Alkaline Phosphatase: 78 units/L (ref 39–117)
BUN: 17 mg/dL (ref 6–23)
Bilirubin, Direct: 0.1 mg/dL (ref 0.0–0.3)
CO2: 26 meq/L (ref 19–32)
Calcium: 10.5 mg/dL (ref 8.4–10.5)
Chloride: 103 meq/L (ref 96–112)
Cholesterol: 190 mg/dL (ref 0–200)
Creatinine, Ser: 0.99 mg/dL (ref 0.40–1.20)
Glucose, Bld: 93 mg/dL (ref 70–99)
HDL: 47 mg/dL (ref >39)
Hgb A1c MFr Bld: 6.9 % — ABNORMAL HIGH (ref <5.7)
Indirect Bilirubin: 0.3 mg/dL (ref 0.0–0.9)
LDL Cholesterol: 115 mg/dL — ABNORMAL HIGH (ref 0–99)
Potassium: 4.5 meq/L (ref 3.5–5.3)
Sodium: 140 meq/L (ref 135–145)
Total Bilirubin: 0.4 mg/dL (ref 0.3–1.2)
Total CHOL/HDL Ratio: 4
Total Protein: 7.7 g/dL (ref 6.0–8.3)
Triglycerides: 140 mg/dL (ref <150)
VLDL: 28 mg/dL (ref 0–40)
Vit D, 25-Hydroxy: 23 ng/mL — ABNORMAL LOW (ref 30–89)

## 2009-09-17 ENCOUNTER — Encounter: Payer: Self-pay | Admitting: Family Medicine

## 2009-09-17 ENCOUNTER — Other Ambulatory Visit
Admission: RE | Admit: 2009-09-17 | Discharge: 2009-09-17 | Payer: Self-pay | Source: Home / Self Care | Admitting: Obstetrics and Gynecology

## 2009-10-23 ENCOUNTER — Telehealth: Payer: Self-pay | Admitting: Family Medicine

## 2009-11-15 ENCOUNTER — Ambulatory Visit: Payer: Self-pay | Admitting: Family Medicine

## 2009-11-19 DIAGNOSIS — J209 Acute bronchitis, unspecified: Secondary | ICD-10-CM | POA: Insufficient documentation

## 2009-11-20 LAB — CONVERTED CEMR LAB
ALT: 16 units/L (ref 0–35)
AST: 17 units/L (ref 0–37)
Albumin: 4.4 g/dL (ref 3.5–5.2)
Alkaline Phosphatase: 79 units/L (ref 39–117)
BUN: 22 mg/dL (ref 6–23)
Bilirubin, Direct: 0.1 mg/dL (ref 0.0–0.3)
CO2: 26 meq/L (ref 19–32)
Calcium: 10.6 mg/dL — ABNORMAL HIGH (ref 8.4–10.5)
Chloride: 103 meq/L (ref 96–112)
Cholesterol: 174 mg/dL (ref 0–200)
Creatinine, Ser: 1.19 mg/dL (ref 0.40–1.20)
Glucose, Bld: 106 mg/dL — ABNORMAL HIGH (ref 70–99)
HDL: 42 mg/dL (ref >39)
Hgb A1c MFr Bld: 6.8 % — ABNORMAL HIGH (ref <5.7)
Indirect Bilirubin: 0.2 mg/dL (ref 0.0–0.9)
LDL Cholesterol: 89 mg/dL (ref 0–99)
Potassium: 4.7 meq/L (ref 3.5–5.3)
Sodium: 139 meq/L (ref 135–145)
Total Bilirubin: 0.3 mg/dL (ref 0.3–1.2)
Total CHOL/HDL Ratio: 4.1
Total Protein: 7.8 g/dL (ref 6.0–8.3)
Triglycerides: 216 mg/dL — ABNORMAL HIGH (ref <150)
VLDL: 43 mg/dL — ABNORMAL HIGH (ref 0–40)

## 2009-12-19 ENCOUNTER — Telehealth (INDEPENDENT_AMBULATORY_CARE_PROVIDER_SITE_OTHER): Payer: Self-pay | Admitting: *Deleted

## 2009-12-20 ENCOUNTER — Telehealth: Payer: Self-pay | Admitting: Family Medicine

## 2010-02-24 ENCOUNTER — Ambulatory Visit (HOSPITAL_COMMUNITY)
Admission: RE | Admit: 2010-02-24 | Discharge: 2010-02-24 | Payer: Self-pay | Source: Home / Self Care | Attending: Family Medicine | Admitting: Family Medicine

## 2010-02-24 IMAGING — MG MM DIGITAL SCREENING
4 series · 4 of 4 positions shown · non-contrast
Comparison: none

DG SCREEN MAMMOGRAM BILATERAL
Bilateral CC and MLO view(s) were taken.

DIGITAL SCREENING MAMMOGRAM WITH CAD:
The breast tissue is almost entirely fatty.  No masses or malignant type calcifications are 
identified.  Compared with prior studies.
Images were processed with CAD.

[L CC]
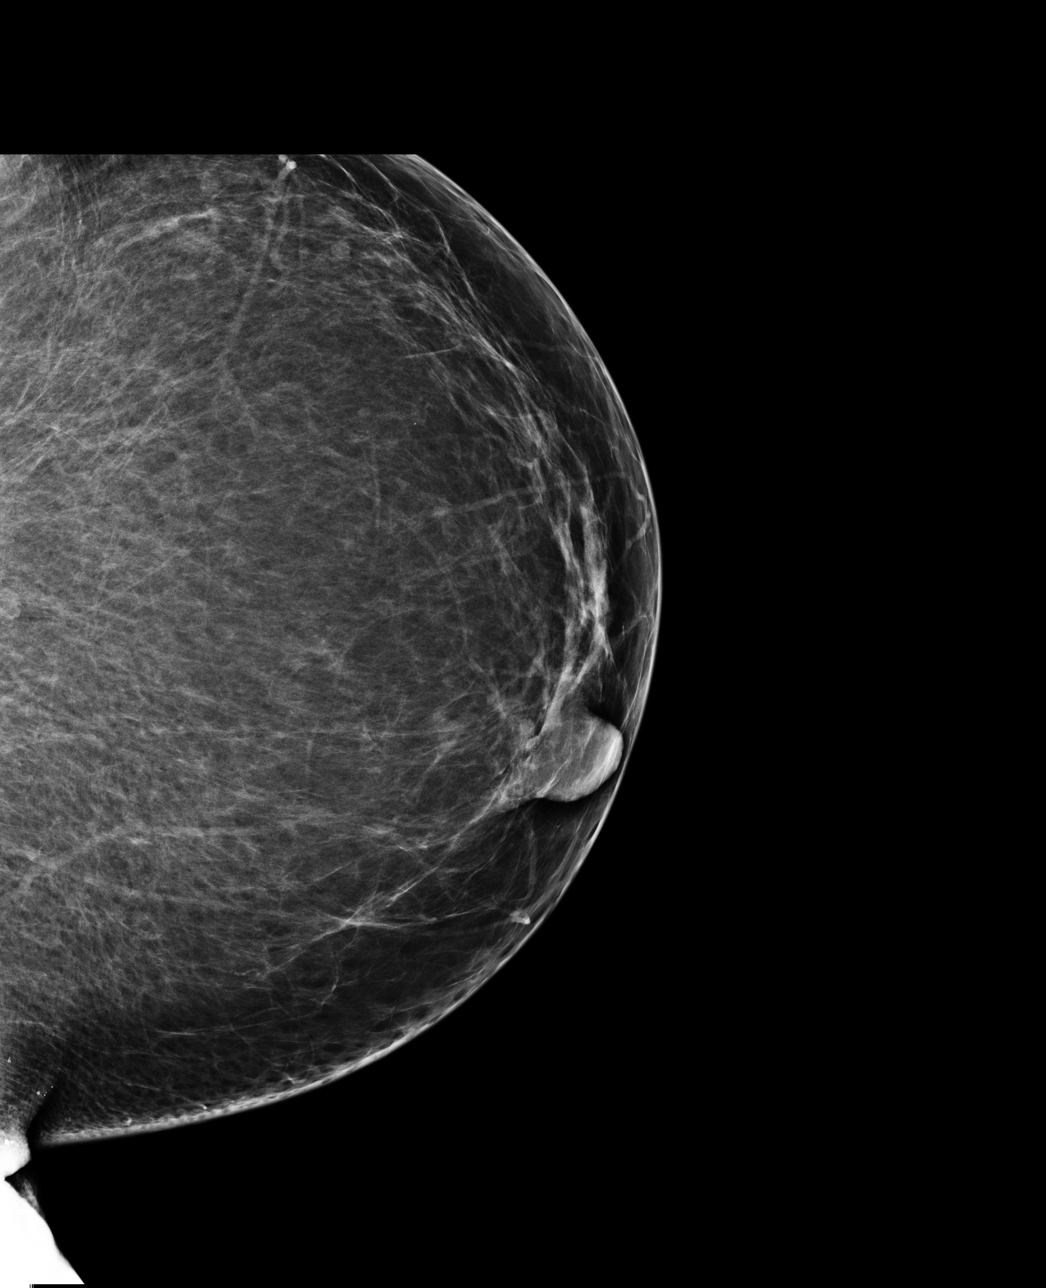

[L MLO]
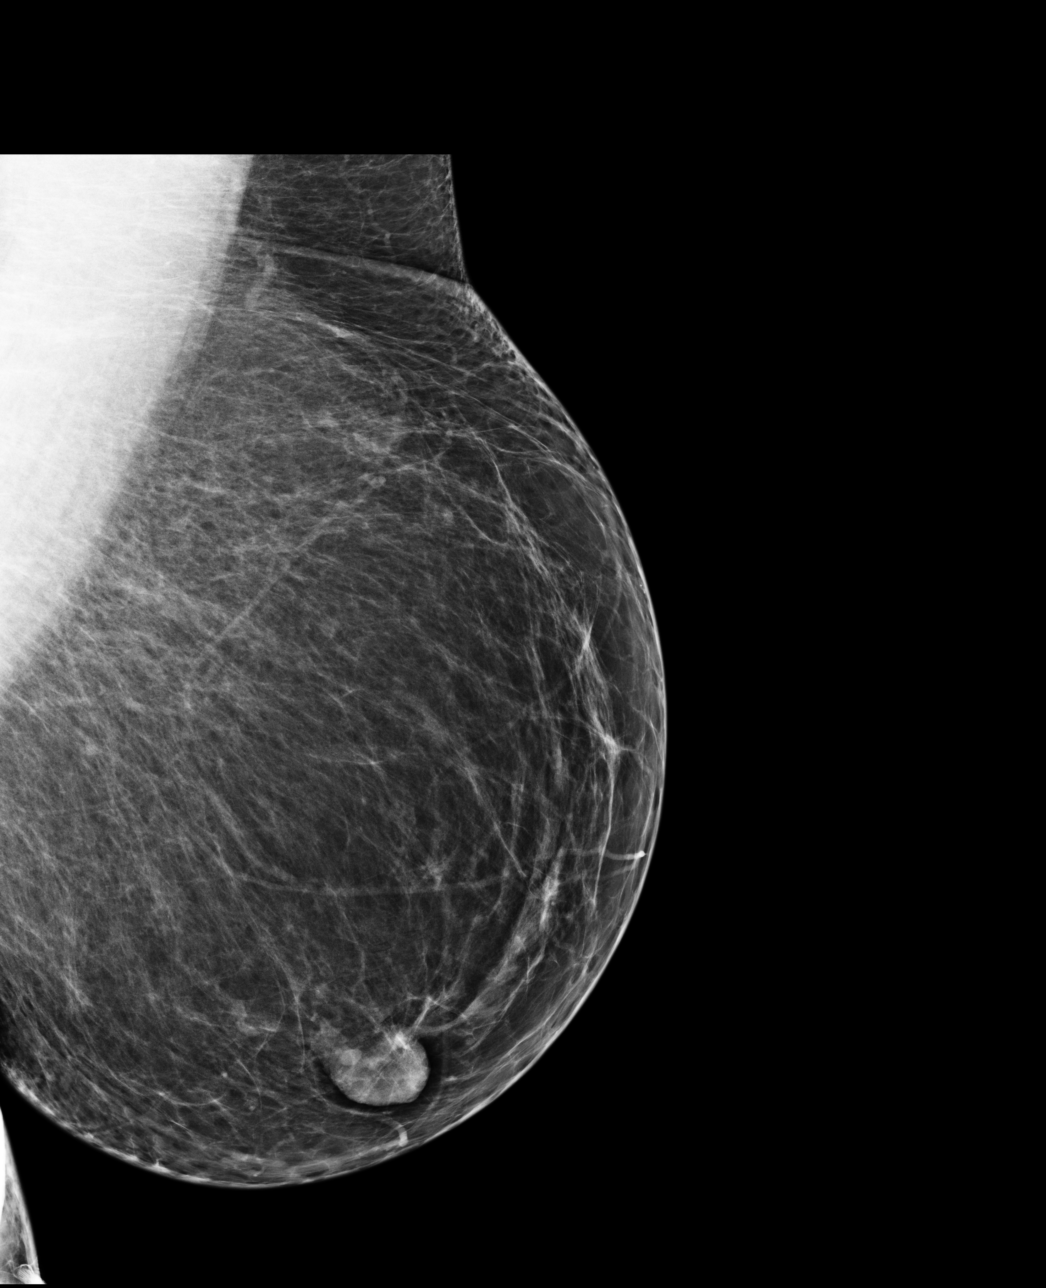

[R CC]
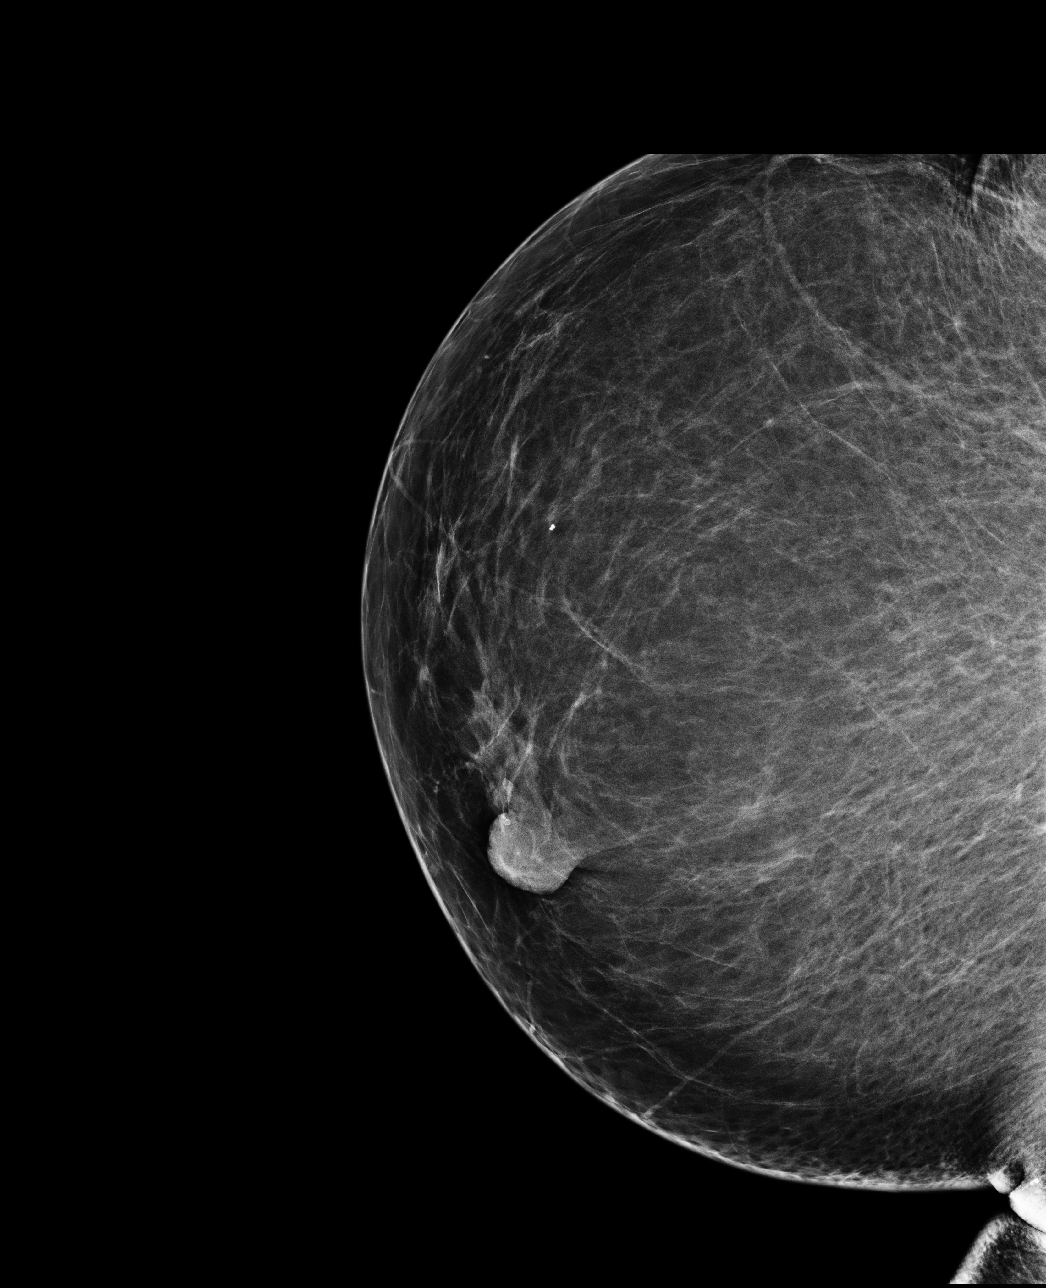

[R MLO]
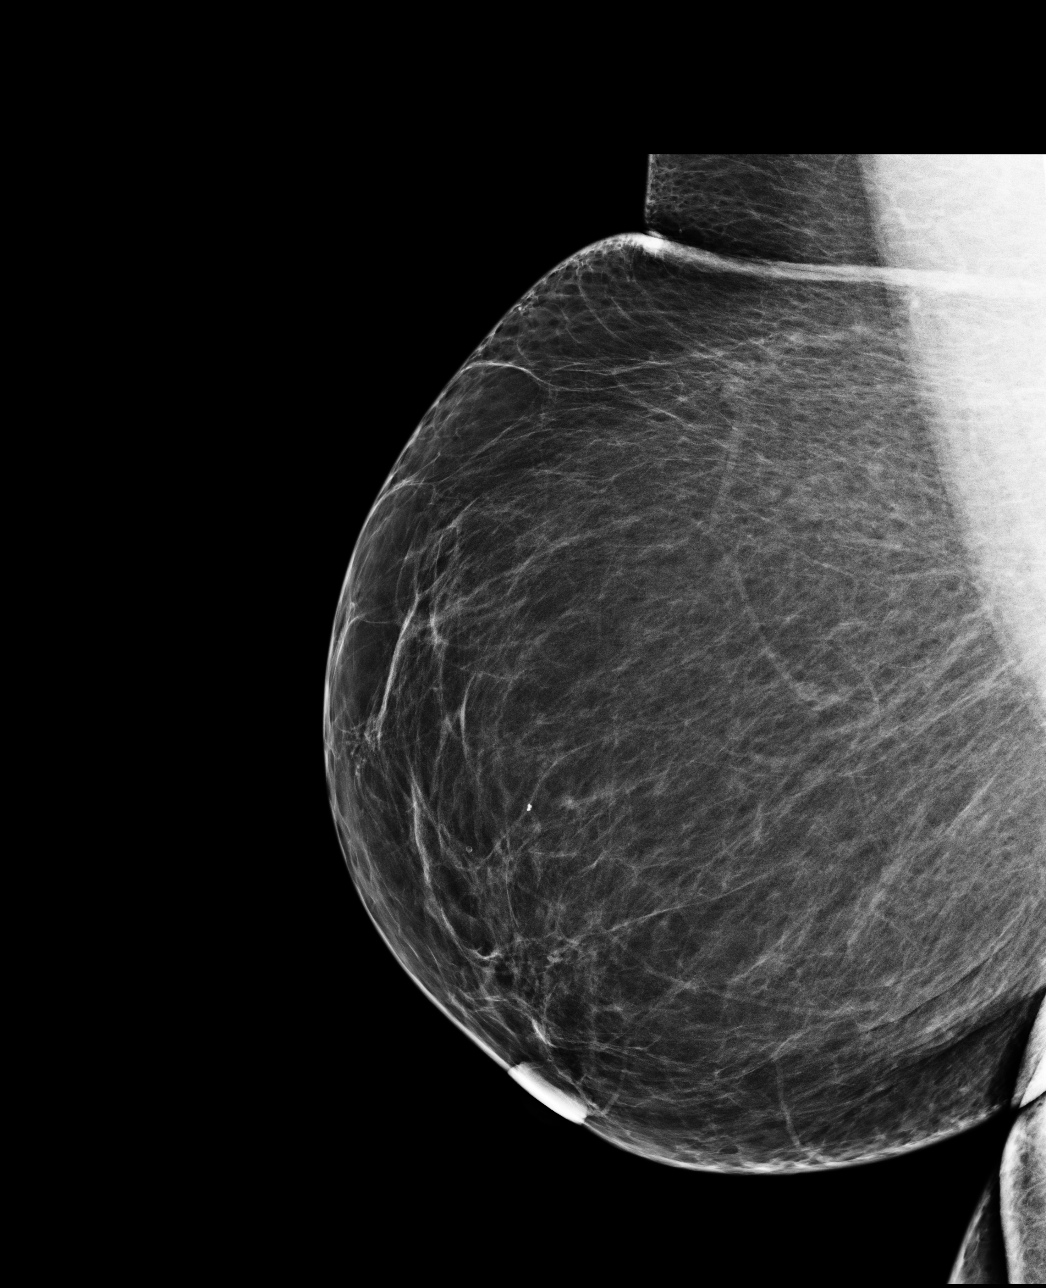

[4 of 4 positions shown; findings below may reference images not displayed]

IMPRESSION: No specific mammographic evidence of malignancy.  Next screening mammogram is recommended in one 
year.

A result letter of this screening mammogram will be mailed directly to the patient.

ASSESSMENT: Negative - BI-RADS 1

Screening mammogram in 1 year.
,

## 2010-02-28 ENCOUNTER — Ambulatory Visit: Admit: 2010-02-28 | Payer: Self-pay | Admitting: Family Medicine

## 2010-03-01 ENCOUNTER — Encounter: Payer: Self-pay | Admitting: Obstetrics and Gynecology

## 2010-03-11 NOTE — Progress Notes (Signed)
Summary: difulcan  Phone Note Call from Patient   Summary of Call: [facility] pharmacy, patient states she needs a diflucan called in she was recently on antibiotic, she will be in a funeral between 2-4 if you have questions. Initial call taken by: Curtis Sites,  December 19, 2009 10:47 AM  Follow-up for Phone Call        pls let her know med is sent to [facility] Follow-up by: Syliva Overman MD,  December 19, 2009 1:07 PM  Additional Follow-up for Phone Call Additional follow up Details #1::        left msg. Additional Follow-up by: Curtis Sites,  December 19, 2009 4:05 PM    New/Updated Medications: FLUCONAZOLE 150 MG TABS (FLUCONAZOLE) Take 1 tablet by mouth once a day , as needed for vaginal itch Prescriptions: FLUCONAZOLE 150 MG TABS (FLUCONAZOLE) Take 1 tablet by mouth once a day , as needed for vaginal itch  #3 x 0   Entered and Authorized by:   Syliva Overman MD   Signed by:   Syliva Overman MD on 12/19/2009   Method used:   Electronically to        [facility] [phone]* (retail)       [address]       [place], [place]  [address]       Ph: [phone] or [phone]       Fax: [phone]   RxID:   [id number]

## 2010-03-11 NOTE — Progress Notes (Signed)
Summary: [facility]   Imported By: Lind Guest 09/20/2009 15:50:42  _____________________________________________________________________  External Attachment:    Type:   Image     Comment:   External Document

## 2010-03-11 NOTE — Letter (Signed)
Summary: phone notes  phone notes   Imported By: Curtis Sites 07/31/2009 12:03:58  _____________________________________________________________________  External Attachment:    Type:   Image     Comment:   External Document

## 2010-03-11 NOTE — Progress Notes (Signed)
  Phone Note Call from Patient   Caller: Patient Summary of Call: states she has taken all meds and still feels bad, congestion and cough advised otc mucinex and delsym Initial call taken by: Adella Hare LPN,  December 20, 2009 12:04 PM

## 2010-03-11 NOTE — Progress Notes (Signed)
Summary: nurse  Phone Note Call from Patient   Summary of Call: pt has brochitis and needs antibotic called in. [phone]  Initial call taken by: Rudene Anda,  October 23, 2009 11:51 AM  Follow-up for Phone Call        needs to schedule appt Follow-up by: Adella Hare LPN,  October 23, 2009 2:19 PM  Additional Follow-up for Phone Call Additional follow up Details #1::        left message for pt to make appt. Additional Follow-up by: Rudene Anda,  October 23, 2009 3:59 PM

## 2010-03-11 NOTE — Letter (Signed)
Summary: xray  xray   Imported By: Curtis Sites 07/31/2009 12:04:27  _____________________________________________________________________  External Attachment:    Type:   Image     Comment:   External Document

## 2010-03-11 NOTE — Assessment & Plan Note (Signed)
Summary: office visit   Vital Signs:  Patient profile:   55 year old female Menstrual status:  hysterectomy Height:      62.5 inches Weight:      225.75 pounds BMI:     40.78 O2 Sat:      97 % Pulse rate:   83 / minute Pulse rhythm:   regular Resp:     16 per minute BP sitting:   108 / 70  (left arm)  Vitals Entered By: Everitt Amber (February 22, 2009 11:17 AM)  Nutrition Counseling: Patient's BMI is greater than 25 and therefore counseled on weight management options. CC: Follow up chronic problems   Primary Care Provider:  Syliva Overman MD  CC:  Follow up chronic problems.  History of Present Illness: Reports  thatshe has been doing well. Denies recent fever or chills. Denies sinus pressure, nasal congestion , ear pain or sore throat. Denies chest congestion, or cough productive of sputum. Denies chest pain, palpitations, PND, orthopnea or leg swelling. Denies abdominal pain, nausea, vomitting, diarrhea or constipation. Denies change in bowel movements or bloody stool. Denies dysuria , frequency, incontinence or hesitancy. Denies  joint pain, swelling, or reduced mobility. Denies headaches, vertigo, seizures. Denies depression, anxiety or insomnia. Denies  rash,  or itch.She is concerned about thickening of the nails of her great toes and discoloration. she is also concerned about the moles on her face and wants to be referred to have this addressed. Tests blood sugars fasting daily, and they are seldom over 115. Still not exercising as planned, but plans to start when the weather improves.     Current Medications (verified): 1)  Adult Aspirin Low Strength 81 Mg  Tbdp (Aspirin) .... Take 1 By Mouth Once Daily 2)  Multivitamins   Tabs (Multiple Vitamin) .... Take 1 By Mouth Once Daily 3)  Metformin Hcl 1000 Mg Tabs (Metformin Hcl) .... One Tab By Mouth Bid 4)  Accu-Chek Aviva  Strp (Glucose Blood) .... Uad 5)  Simvastatin 80 Mg Tabs (Simvastatin) .... Take 1 Tab By  Mouth At Bedtime 6)  Maxzide-25 37.5-25 Mg Tabs (Triamterene-Hctz) .... Take 1 Tablet By Mouth Once A Day 7)  Oscal 500/200 D-3 500-200 Mg-Unit Tabs (Calcium-Vitamin D) .... One Tab By Mouth Tid 8)  Accu-Chek Softclix Lancets  Misc (Lancets) .... Uad  Allergies (verified): No Known Drug Allergies  Review of Systems      See HPI Eyes:  Denies blurring and discharge; recently had eye exam which showed no diabetic reinopathy. Neuro:  Denies headaches, poor balance, and seizures. Endo:  Denies cold intolerance, excessive hunger, excessive thirst, excessive urination, heat intolerance, polyuria, and weight change. Heme:  Denies abnormal bruising and bleeding. Allergy:  Denies hives or rash and sneezing.  Physical Exam  General:  Well-developed,obese,in no acute distress; alert,appropriate and cooperative throughout examination HEENT: No facial asymmetry,  EOMI, No sinus tenderness, TM's Clear, oropharynx  pink and moist.   Chest: Clear to auscultation bilaterally.  CVS: S1, S2, No murmurs, No S3.   Abd: Soft, Nontender.  MS: Adequate ROM spine, hips, shoulders and knees.  Ext: No edema.   CNS: CN 2-12 intact, power tone and sensation normal throughout.   Skin: Intact, no visible lesions or rashes.Positive onychomycosis  Psych: Good eye contact, normal affect.  Memory intact, not anxious or depressed appearing.   Diabetes Management Exam:    Foot Exam (with socks and/or shoes not present):       Sensory-Monofilament:  Left foot: normal          Right foot: normal       Inspection:          Left foot: abnormal             Comments: large bunion          Right foot: normal       Nails:          Left foot: fungal infection          Right foot: fungal infection    Eye Exam:       Eye Exam done elsewhere          Results: normal          Done by: doctor's vision   Impression & Recommendations:  Problem # 1:  DIABETES MELLITUS, TYPE II (ICD-250.00) Assessment  Unchanged  Her updated medication list for this problem includes:    Adult Aspirin Low Strength 81 Mg Tbdp (Aspirin) .Marland Kitchen... Take 1 by mouth once daily    Metformin Hcl 1000 Mg Tabs (Metformin hcl) ..... One tab by mouth bid  Orders: T- Hemoglobin A1C ([id number]) Glucose, (CBG) [phone])  Labs Reviewed: Creat: 0.95 (10/22/2008)    Reviewed HgBA1c results: 6.5 (02/22/2009)  6.6 (11/22/2008)  Problem # 2:  OBESITY (ICD-278.00) Assessment: Improved  Ht: 62.5 (02/22/2009)   Wt: 225.75 (02/22/2009)   BMI: 40.78 (02/22/2009)  Problem # 3:  HYPERTENSION (ICD-401.9) Assessment: Unchanged  Her updated medication list for this problem includes:    Maxzide-25 37.5-25 Mg Tabs (Triamterene-hctz) .Marland Kitchen... Take 1 tablet by mouth once a day  Prior BP: 100/62 (11/22/2008) Current BP: 108/70 Labs Reviewed: K+: 4.5 (10/22/2008) Creat: : 0.95 (10/22/2008)   Chol: 234 (10/22/2008)   HDL: 51 (10/22/2008)   LDL: 147 (10/22/2008)   TG: 181 (10/22/2008)  Problem # 4:  OBSTRUCTIVE SLEEP APNEA (ICD-327.23) Assessment: Improved pt using cPAP and feels better  Problem # 5:  HYPERLIPIDEMIA (ICD-272.4) Assessment: Comment Only  Her updated medication list for this problem includes:    Simvastatin 80 Mg Tabs (Simvastatin) .Marland Kitchen... Take 1 tab by mouth at bedtime  Orders: T-Hepatic Function [phone])  Labs Reviewed: SGOT: 13 (10/22/2008)   SGPT: 14 (10/22/2008)   HDL:51 (10/22/2008), 49 (01/25/2008)  LDL:147 (10/22/2008), 103 (01/25/2008)  Chol:234 (10/22/2008), 177 (01/25/2008)  Trig:181 (10/22/2008), 125 (01/25/2008)  Problem # 6:  NEVI, MULTIPLE (ICD-216.9) Assessment: Deteriorated  Orders: Surgical Referral (Surgery)  Problem # 7:  ONYCHOMYCOSIS, TOENAILS (ICD-110.1) Assessment: Deteriorated  Her updated medication list for this problem includes:    Terbinafine Hcl 250 Mg Tabs (Terbinafine hcl) .Marland Kitchen... Take 1 tablet by mouth once a day  Complete Medication List: 1)  Adult Aspirin Low  Strength 81 Mg Tbdp (Aspirin) .... Take 1 by mouth once daily 2)  Multivitamins Tabs (Multiple vitamin) .... Take 1 by mouth once daily 3)  Metformin Hcl 1000 Mg Tabs (Metformin hcl) .... One tab by mouth bid 4)  Accu-chek Aviva Strp (Glucose blood) .... Uad 5)  Simvastatin 80 Mg Tabs (Simvastatin) .... Take 1 tab by mouth at bedtime 6)  Maxzide-25 37.5-25 Mg Tabs (Triamterene-hctz) .... Take 1 tablet by mouth once a day 7)  Oscal 500/200 D-3 500-200 Mg-unit Tabs (Calcium-vitamin d) .... One tab by mouth tid 8)  Accu-chek Softclix Lancets Misc (Lancets) .... Uad 9)  Terbinafine Hcl 250 Mg Tabs (Terbinafine hcl) .... Take 1 tablet by mouth once a day 10)  Vitamin C 500 Mg Tabs (Ascorbic acid) .Marland KitchenMarland KitchenMarland Kitchen  Take 1 tablet by mouth once a day  Patient Instructions: 1)  Please schedule a follow-up appointment in 4 months. 2)  It is important that you exercise regularly at least 20 minutes 5 times a week. If you develop chest pain, have severe difficulty breathing, or feel very tired , stop exercising immediately and seek medical attention. 3)  You need to lose weight. Consider a lower calorie diet and regular exercise.  4)  your bP and labs are great, no med changes Prescriptions: VITAMIN C 500 MG TABS (ASCORBIC ACID) Take 1 tablet by mouth once a day  #90 x 3   Entered by:   Everitt Amber   Authorized by:   Syliva Overman MD   Signed by:   Syliva Overman MD on 02/22/2009   Method used:   Handwritten   RxID:   [id number] MULTIVITAMINS   TABS (MULTIPLE VITAMIN) take 1 by mouth once daily  #90 x 3   Entered by:   Everitt Amber   Authorized by:   Syliva Overman MD   Signed by:   Syliva Overman MD on 02/22/2009   Method used:   Handwritten   RxID:   [id number] TERBINAFINE HCL 250 MG TABS (TERBINAFINE HCL) Take 1 tablet by mouth once a day  #30 x 1   Entered and Authorized by:   Syliva Overman MD   Signed by:   Syliva Overman MD on 02/22/2009   Method used:   Electronically to         [facility] [phone]* (retail)       [address]       [place], [place]  [address]       Ph: [phone] or [phone]       Fax: [phone]   RxID:   [id number]   Laboratory Results   Blood Tests   Date/Time Received: February 22, 2009  Date/Time Reported: February 22, 2009   Glucose (random): 116 mg/dL   (Normal Range: 54-627) HGBA1C: 6.5%   (Normal Range: Non-Diabetic - 3-6%   Control Diabetic - 6-8%)

## 2010-03-11 NOTE — Progress Notes (Signed)
Summary: LAB RESULTS  Phone Note Call from Patient   Summary of Call: WANTS RESULTS ON LAB Initial call taken by: Lind Guest,  July 24, 2009 4:02 PM  Follow-up for Phone Call        returned call, left message Follow-up by: Adella Hare LPN,  July 24, 2009 4:25 PM  Additional Follow-up for Phone Call Additional follow up Details #1::        patient aware Additional Follow-up by: Adella Hare LPN,  July 24, 2009 4:28 PM

## 2010-03-11 NOTE — Letter (Signed)
Summary: Letter  Letter   Imported By: Lind Guest 07/10/2009 15:30:55  _____________________________________________________________________  External Attachment:    Type:   Image     Comment:   External Document

## 2010-03-11 NOTE — Letter (Signed)
Summary: misc  misc   Imported By: Curtis Sites 07/31/2009 12:03:42  _____________________________________________________________________  External Attachment:    Type:   Image     Comment:   External Document

## 2010-03-11 NOTE — Progress Notes (Signed)
  Phone Note From Pharmacy   Caller: [facility]. [phone]* Summary of Call: terbinafine 250mg  requires pa would you like to substitute with somthing else? Initial call taken by: Worthy Keeler LPN,  February 25, 2009 2:11 PM  Follow-up for Phone Call        this is on the $4 med list, advise ptto fill without using her ins card, all the oral will need pa if no fungal scraping s done Follow-up by: Syliva Overman MD,  February 25, 2009 5:18 PM  Additional Follow-up for Phone Call Additional follow up Details #1::        called patient, left message Additional Follow-up by: Worthy Keeler LPN,  February 26, 2009 11:06 AM    Additional Follow-up for Phone Call Additional follow up Details #2::    patient aware Follow-up by: Worthy Keeler LPN,  February 26, 2009 1:13 PM

## 2010-03-11 NOTE — Assessment & Plan Note (Signed)
Summary: office visit   Vital Signs:  Patient profile:   55 year old female Menstrual status:  hysterectomy Height:      62.5 inches Weight:      223 pounds BMI:     40.28 O2 Sat:      93 % on Room air Pulse rate:   72 / minute Pulse rhythm:   regular Resp:     16 per minute BP sitting:   126 / 76  (left arm)  Vitals Entered By: Adella Hare LPN (November 15, 2009 11:27 AM)  Nutrition Counseling: Patient's BMI is greater than 25 and therefore counseled on weight management options.  O2 Flow:  Room air CC: FOLLOW UP Is Patient Diabetic? Yes Did you bring your meter with you today? No Pain Assessment Patient in pain? no      Comments DID NOT BRING MEDS TO OV   Primary Care Loretha Ure:  Syliva Overman MD  CC:  FOLLOW UP.  History of Present Illness: 5 week h/o chest congestion and cough sometimes productive of thick grey spiutum, she has had no fever or chills. she denies depresssion or anxiety. she denies polyuria,polydypsia or blurrred vision, tests daily blood sugars, which are seldom over 120. she reporrts a healthy apetite , wioth no change inbowel movements or visible rectal bleeding. he is not commited to regular exercise and has lost no weight.  Allergies (verified): No Known Drug Allergies  Review of Systems      See HPI General:  Complains of chills and fatigue; denies fever. Eyes:  Denies blurring and discharge. ENT:  Denies earache, hoarseness, nasal congestion, sinus pressure, and sore throat. CV:  Denies chest pain or discomfort, palpitations, and swelling of feet. Resp:  Complains of cough and sputum productive. GI:  Denies abdominal pain, constipation, dark tarry stools, nausea, and vomiting. GU:  Denies dysuria and urinary frequency. MS:  Denies joint pain and stiffness. Derm:  Complains of lesion(s); denies itching and rash; multiple moles on face, toenail infection improved. Neuro:  Denies headaches, poor balance, seizures, and sensation of room  spinning. Psych:  Denies anxiety and depression; stress which is job related, handling this well. Endo:  Denies excessive thirst and excessive urination. Heme:  Denies abnormal bruising and bleeding. Allergy:  Complains of seasonal allergies.  Physical Exam  General:  Well-developed,obese,in no acute distress; alert,appropriate and cooperative throughout examination HEENT: No facial asymmetry,  EOMI, No sinus tenderness, TM's Clear, oropharynx  pink and moist.   Chest: decreaed air entry, bilateral crackles, no wheezes.  CVS: S1, S2, No murmurs, No S3.   Abd: Soft, Nontender.  MS: Adequate ROM spine, hips, shoulders and knees.  Ext: No edema.   CNS: CN 2-12 intact, power tone and sensation normal throughout.   Skin: Intact, multi nevi on face, onychomycosis of toenails iomproved  Psych: Good eye contact, normal affect.  Memory intact, not anxious or depressed appearing.   Diabetes Management Exam:    Foot Exam (with socks and/or shoes not present):       Sensory-Monofilament:          Left foot: normal          Right foot: normal       Inspection:          Left foot: normal          Right foot: normal       Nails:          Left foot: fungal infection  Right foot: fungal infection   Impression & Recommendations:  Problem # 1:  ONYCHOMYCOSIS, TOENAILS (ICD-110.1) Assessment Improved  The following medications were removed from the medication list:    Terbinafine Hcl 250 Mg Tabs (Terbinafine hcl) .Marland Kitchen... Take 1 tablet by mouth once a day  Problem # 2:  NEVI, MULTIPLE (ICD-216.9) Assessment: Unchanged  Problem # 3:  ACUTE BRONCHITIS (ICD-466.0) Assessment: Comment Only  Her updated medication list for this problem includes:    Penicillin V Potassium 500 Mg Tabs (Penicillin v potassium) .Marland Kitchen... Take 1 tablet by mouth three times a day    Tussionex Pennkinetic Er 10-8 Mg/20ml Lqcr (Hydrocod polst-chlorphen polst) ..... One teaspoon twice daily as needed  Problem # 4:   DIABETES MELLITUS, TYPE II (ICD-250.00) Assessment: Comment Only  Her updated medication list for this problem includes:    Adult Aspirin Low Strength 81 Mg Tbdp (Aspirin) .Marland Kitchen... Take 1 by mouth once daily    Metformin Hcl 1000 Mg Tabs (Metformin hcl) ..... One tab by mouth bid  Orders: T- Hemoglobin A1C ([id number]) T-Urine Microalbumin w/creat. ratio [phone]) Patient advised to reduce carbs and sweets, commit to regular physical activity, take meds as prescribed, test blood sugars as directed, and attempt to lose weight , to improve blood sugar control.  Labs Reviewed: Creat: 0.99 (07/02/2009)    Reviewed HgBA1c results: 6.9 (07/02/2009)  6.5 (02/22/2009)  Problem # 5:  HYPERLIPIDEMIA (ICD-272.4) Assessment: Comment Only  Labs Reviewed: SGOT: 15 (07/02/2009)   SGPT: 13 (07/02/2009)   HDL:47 (07/02/2009), 46 (02/20/2009)  LDL:115 (07/02/2009), 97 (78/93/8101)  Chol:190 (07/02/2009), 169 (02/20/2009)  Trig:140 (07/02/2009), 131 (02/20/2009) Low fat dietdiscussed and encouraged new is simvastatin 40mg  Take 1 tab by mouth at bedtime (dose reduction)  Problem # 6:  OBESITY (ICD-278.00) Assessment: Unchanged  Ht: 62.5 (11/15/2009)   Wt: 223 (11/15/2009)   BMI: 40.28 (11/15/2009) therapeutic lifestyle change discussed and encouraged  Complete Medication List: 1)  Adult Aspirin Low Strength 81 Mg Tbdp (Aspirin) .... Take 1 by mouth once daily 2)  Multivitamins Tabs (Multiple vitamin) .... Take 1 by mouth once daily 3)  Metformin Hcl 1000 Mg Tabs (Metformin hcl) .... One tab by mouth bid 4)  Accu-chek Aviva Strp (Glucose blood) .... Uad 5)  Maxzide-25 37.5-25 Mg Tabs (Triamterene-hctz) .... Take 1 tablet by mouth once a day 6)  Oscal 500/200 D-3 500-200 Mg-unit Tabs (Calcium-vitamin d) .... One tab by mouth tid 7)  Accu-chek Softclix Lancets Misc (Lancets) .... Uad 8)  Vitamin C 500 Mg Tabs (Ascorbic acid) .... Take 1 tablet by mouth once a day 9)  Simvastatin 40 Mg Tabs  (Simvastatin) .... Take 1 tab by mouth at bedtime 10)  Penicillin V Potassium 500 Mg Tabs (Penicillin v potassium) .... Take 1 tablet by mouth three times a day 11)  Tussionex Pennkinetic Er 10-8 Mg/55ml Lqcr (Hydrocod polst-chlorphen polst) .... One teaspoon twice daily as needed  Other Orders: T-Basic Metabolic Panel [phone]) T-Lipid Profile [id number]) T-Hepatic Function [phone])  Patient Instructions: 1)  Please schedule a follow-up appointment in 3 months. 2)  Return in 3 weeks for flu vac. 3)    You are being treated for bronchitis 4)  Samples of delsyn 1 tsp 3 times daily are being given for 1 week 5)  Meds a re sent in. 6)  Fasting labs asap. 7)  Urine Microalbumin prior to visit, ICD-9:  today from office. 8)  Stop simvastatin 80mg  , new is 40mg   Prescriptions: TUSSIONEX PENNKINETIC ER 10-8 MG/5ML LQCR (HYDROCOD  POLST-CHLORPHEN POLST) one teaspoon twice daily as needed  #300cc x 1   Entered and Authorized by:   Syliva Overman MD   Signed by:   Syliva Overman MD on 11/15/2009   Method used:   Printed then faxed to ...       [address] [phone]* (retail)       [address]       [place], [place]  [address]       Ph: [phone] or [phone]       Fax: [phone]   RxID:   [id number] PENICILLIN V POTASSIUM 500 MG TABS (PENICILLIN V POTASSIUM) Take 1 tablet by mouth three times a day  #30 x 0   Entered and Authorized by:   Syliva Overman MD   Signed by:   Syliva Overman MD on 11/15/2009   Method used:   Electronically to        [facility] [phone]* (retail)       [address]       [place], [place]  [address]       Ph: [phone] or [phone]       Fax: [phone]   RxID:   [id number] SIMVASTATIN 40 MG TABS (SIMVASTATIN) Take 1 tab by mouth at bedtime  #90 x 1   Entered and Authorized by:   Syliva Overman MD   Signed by:   Syliva Overman MD on 11/15/2009   Method used:   Printed then faxed  to ...       [address] [phone]* (retail)       [address]       [place], [place]  [address]       Ph: [phone] or [phone]       Fax: [phone]   RxID:   [id number]

## 2010-03-11 NOTE — Progress Notes (Signed)
Summary: bp meds  Phone Note Call from Patient   Summary of Call: pt needs to talk about her blood pressure medicine. [phone] cell [phone] Initial call taken by: Rudene Anda,  August 09, 2008 1:35 PM  Follow-up for Phone Call        msg left for her to lv a detailed msg with the nurse in the qm, it can be left on nurse's machine before 12md, i will be able to respond , if later, no response before next week likely Follow-up by: Syliva Overman MD,  August 09, 2008 6:09 PM  Additional Follow-up for Phone Call Additional follow up Details #1::        SHE HAS CALLED AND SENT HER TO [facility] Additional Follow-up by: Lind Guest,  August 10, 2008 10:22 AM

## 2010-03-11 NOTE — Letter (Signed)
Summary: lab  lab   Imported By: Curtis Sites 07/31/2009 12:03:25  _____________________________________________________________________  External Attachment:    Type:   Image     Comment:   External Document

## 2010-03-11 NOTE — Letter (Signed)
Summary: consult  consult   Imported By: Curtis Sites 07/31/2009 12:02:31  _____________________________________________________________________  External Attachment:    Type:   Image     Comment:   External Document

## 2010-03-11 NOTE — Letter (Signed)
Summary: history and physical  history and physical   Imported By: Curtis Sites 07/31/2009 12:03:08  _____________________________________________________________________  External Attachment:    Type:   Image     Comment:   External Document

## 2010-03-11 NOTE — Letter (Signed)
Summary: progress notes  progress notes   Imported By: Curtis Sites 07/31/2009 12:04:13  _____________________________________________________________________  External Attachment:    Type:   Image     Comment:   External Document

## 2010-03-11 NOTE — Letter (Signed)
Summary: demographic  demographic   Imported By: Curtis Sites 07/31/2009 12:02:50  _____________________________________________________________________  External Attachment:    Type:   Image     Comment:   External Document

## 2010-03-11 NOTE — Progress Notes (Signed)
Summary: pill  Phone Note Call from Patient   Summary of Call: needs a diflucan called into [facility]. [phone] Initial call taken by: Rudene Anda,  July 12, 2009 8:52 AM  Follow-up for Phone Call        Rx Called In Follow-up by: Esperanza Sheets PA,  July 12, 2009 11:39 AM    New/Updated Medications: FLUCONAZOLE 150 MG TABS (FLUCONAZOLE) take 1 by mouth Prescriptions: FLUCONAZOLE 150 MG TABS (FLUCONAZOLE) take 1 by mouth  #1 x 0   Entered and Authorized by:   Esperanza Sheets PA   Signed by:   Esperanza Sheets PA on 07/12/2009   Method used:   Electronically to        [facility] [phone]* (retail)       [address]       [place], [place]  [address]       Ph: [phone] or [phone]       Fax: [phone]   RxID:   [id number]

## 2010-03-11 NOTE — Assessment & Plan Note (Signed)
Summary: office visit   Vital Signs:  Patient profile:   55 year old female Menstrual status:  hysterectomy Height:      62.5 inches Weight:      223 pounds BMI:     40.28 O2 Sat:      98 % Pulse rate:   83 / minute Pulse rhythm:   regular Resp:     16 per minute BP sitting:   122 / 78  (left arm) Cuff size:   large  Vitals Entered By: Everitt Amber LPN (Jul 02, 2009 11:21 AM)  Nutrition Counseling: Patient's BMI is greater than 25 and therefore counseled on weight management options. CC: wants Vytorin and last BP med because she has side effects with the maxzide and simvastatin. Also wants a fluconazole for a yeast infection   Primary Care Provider:  Syliva Overman MD  CC:  wants Vytorin and last BP med because she has side effects with the maxzide and simvastatin. Also wants a fluconazole for a yeast infection.  History of Present Illness: Reports  that she has been  doing well.Her onlyconcern is regarding changes in skin color particularly along her hairline, which her hairdresser has told her is due to medication, she wants to change from simvastatin which medically makes little sense and will liokely noy be covered , she will look into itfurther. Denies recent fever or chills. Denies sinus pressure, nasal congestion , ear pain or sore throat. Denies chest congestion, or cough productive of sputum. Denies chest pain, palpitations, PND, orthopnea or leg swelling. Denies abdominal pain, nausea, vomitting, diarrhea or constipation. Denies change in bowel movements or bloody stool. Denies dysuria , frequency, incontinence or hesitancy. Denies  joint pain, swelling, or reduced mobility. Denies headaches, vertigo, seizures. Denies depression, anxiety or insomnia. Denies  rash, lesions, or itch.     Allergies: No Known Drug Allergies  Review of Systems      See HPI Eyes:  Denies blurring, discharge, eye pain, and red eye. Endo:  Denies cold intolerance, excessive hunger,  excessive thirst, excessive urination, heat intolerance, polyuria, and weight change; tswests once daily, fastings range from 80 to 110. Heme:  Denies abnormal bruising and bleeding. Allergy:  Denies hives or rash and itching eyes.  Physical Exam  General:  Well-developed,obese,in no acute distress; alert,appropriate and cooperative throughout examination HEENT: No facial asymmetry,  EOMI, No sinus tenderness, TM's Clear, oropharynx  pink and moist.   Chest: Clear to auscultation bilaterally.  CVS: S1, S2, No murmurs, No S3.   Abd: Soft, Nontender.  MS: Adequate ROM spine, hips, shoulders and knees.  Ext: No edema.   CNS: CN 2-12 intact, power tone and sensation normal throughout.   Skin: Intact, no visible lesions or rashes.Positive onychomycosis  Psych: Good eye contact, normal affect.  Memory intact, not anxious or depressed appearing.   Diabetes Management Exam:    Foot Exam (with socks and/or shoes not present):       Sensory-Monofilament:          Left foot: normal          Right foot: normal       Inspection:          Left foot: normal          Right foot: normal       Nails:          Left foot: fungal infection          Right foot: fungal infection   Impression &  Recommendations:  Problem # 1:  ONYCHOMYCOSIS, TOENAILS (ICD-110.1) Assessment Unchanged  Her updated medication list for this problem includes:    Terbinafine Hcl 250 Mg Tabs (Terbinafine hcl) .Marland Kitchen... Take 1 tablet by mouth once a day, pt has not filled script  Problem # 2:  DIABETES MELLITUS, TYPE II (ICD-250.00) Assessment: Comment Only  Her updated medication list for this problem includes:    Adult Aspirin Low Strength 81 Mg Tbdp (Aspirin) .Marland Kitchen... Take 1 by mouth once daily    Metformin Hcl 1000 Mg Tabs (Metformin hcl) ..... One tab by mouth bid  Orders: T- Hemoglobin A1C ([id number])  Labs Reviewed: Creat: 1.01 (02/20/2009)    Reviewed HgBA1c results: 6.5 (02/22/2009)  6.6 (11/22/2008)  Her  updated medication list for this problem includes:    Adult Aspirin Low Strength 81 Mg Tbdp (Aspirin) .Marland Kitchen... Take 1 by mouth once daily    Metformin Hcl 1000 Mg Tabs (Metformin hcl) ..... One tab by mouth bid  Problem # 3:  HYPERTENSION (ICD-401.9) Assessment: Unchanged  Her updated medication list for this problem includes:    Maxzide-25 37.5-25 Mg Tabs (Triamterene-hctz) .Marland Kitchen... Take 1 tablet by mouth once a day  Orders: T-Basic Metabolic Panel [phone])  BP today: 122/78 Prior BP: 108/70 (02/22/2009)  Labs Reviewed: K+: 4.7 (02/20/2009) Creat: : 1.01 (02/20/2009)   Chol: 169 (02/20/2009)   HDL: 46 (02/20/2009)   LDL: 97 (02/20/2009)   TG: 131 (02/20/2009)  Her updated medication list for this problem includes:    Maxzide-25 37.5-25 Mg Tabs (Triamterene-hctz) .Marland Kitchen... Take 1 tablet by mouth once a day  Problem # 4:  HYPERLIPIDEMIA (ICD-272.4) Assessment: Comment Only  Her updated medication list for this problem includes:    Simvastatin 80 Mg Tabs (Simvastatin) .Marland Kitchen... Take 1 tab by mouth at bedtime  Orders: T-Hepatic Function [id number]) T-Lipid Profile [phone])  Labs Reviewed: SGOT: 13 (10/22/2008)   SGPT: 14 (10/22/2008)   HDL:46 (02/20/2009), 51 (10/22/2008)  LDL:97 (02/20/2009), 147 (28/41/3244)  Chol:169 (02/20/2009), 234 (10/22/2008)  Trig:131 (02/20/2009), 181 (10/22/2008)  Problem # 5:  OBESITY (ICD-278.00) Assessment: Unchanged  Ht: 62.5 (07/02/2009)   Wt: 223 (07/02/2009)   BMI: 40.28 (07/02/2009)  Complete Medication List: 1)  Adult Aspirin Low Strength 81 Mg Tbdp (Aspirin) .... Take 1 by mouth once daily 2)  Multivitamins Tabs (Multiple vitamin) .... Take 1 by mouth once daily 3)  Metformin Hcl 1000 Mg Tabs (Metformin hcl) .... One tab by mouth bid 4)  Accu-chek Aviva Strp (Glucose blood) .... Uad 5)  Simvastatin 80 Mg Tabs (Simvastatin) .... Take 1 tab by mouth at bedtime 6)  Maxzide-25 37.5-25 Mg Tabs (Triamterene-hctz) .... Take 1 tablet by mouth  once a day 7)  Oscal 500/200 D-3 500-200 Mg-unit Tabs (Calcium-vitamin d) .... One tab by mouth tid 8)  Accu-chek Softclix Lancets Misc (Lancets) .... Uad 9)  Terbinafine Hcl 250 Mg Tabs (Terbinafine hcl) .... Take 1 tablet by mouth once a day 10)  Vitamin C 500 Mg Tabs (Ascorbic acid) .... Take 1 tablet by mouth once a day  Other Orders: T-Vitamin D (25-Hydroxy) ([id number])  Patient Instructions: 1)  Please schedule a follow-up appointment in 4 months. 2)  It is important that you exercise regularly at least 20 minutes 5 times a week. If you develop chest pain, have severe difficulty breathing, or feel very tired , stop exercising immediately and seek medical attention. 3)  You need to lose weight. Consider a lower calorie diet and regular exercise.  4)  Check your blood  sugars regularly. If your readings are usually above : or below 70 you should contact our office. 5)  BMP prior to visit, ICD-9: 6)  Hepatic Panel prior to visit, ICD-9: 7)  Lipid Panel prior to visit, ICD-9: 8)  HbgA1C prior to visit, ICD-9:   fasting today 9)  Vit d 10)  Urine Microalbumin prior to visit, ICD-9:

## 2010-03-31 ENCOUNTER — Ambulatory Visit: Payer: Self-pay | Admitting: Family Medicine

## 2010-04-24 ENCOUNTER — Ambulatory Visit: Payer: Self-pay | Admitting: Family Medicine

## 2010-05-16 LAB — BLOOD GAS, ARTERIAL
Bicarbonate: 25.8 mEq/L — ABNORMAL HIGH (ref 20.0–24.0)
pCO2 arterial: 43.5 mmHg (ref 35.0–45.0)
pH, Arterial: 7.391 (ref 7.350–7.400)
pO2, Arterial: 71.6 mmHg — ABNORMAL LOW (ref 80.0–100.0)

## 2010-06-24 NOTE — Op Note (Signed)
Pamela Stein, Pamela Stein            ACCOUNT NO.:  [id number]   MEDICAL RECORD NO.:  [id number]          PATIENT TYPE:  AMB   LOCATION:  DAY                           FACILITY:  [facility]   PHYSICIAN:  R. Roetta Sessions, M.D. DATE OF BIRTH:  April 01, 1955   DATE OF PROCEDURE:  09/27/2006  DATE OF DISCHARGE:  09/27/2006                               OPERATIVE REPORT   INDICATIONS FOR PROCEDURE:  This patient is a 55 year old lady sent over  by courtesy of [facility] for colorectal cancer screening.  She  has no lower GI tract symptoms.  She has never had lower GI tract  imaged.  There is no family history colorectal neoplasia.  Colonoscopy  is now being done.  This approach has been discussed with the patient at  length.  The potential risks, benefits and alternative have been  reviewed, questions answered.  Please see the documentation in the  medical record.   PROCEDURE NOTE:  O2 saturation, blood pressure, pulse and respirations  were monitored throughout the entire procedure.  Conscious sedation was  with Versed 4 mg IV, Demerol 50 mg IV in divided doses.  Instrumentation  was Pentax video chip system.   FINDINGS:  Digital rectal exam revealed no abnormalities.  The prep was  adequate.  COLON:  Colonic mucosa was surveyed from the rectosigmoid junction  through to the left transverse right colon.  The appendiceal orifice,  ileocecal valve and cecum appeared.  These structures were well seen and  photographed for the record.  From this level the scope was slowly  withdrawn.  All previous mentioned mucosal surfaces were again seen.  The colonic mucosa appeared normal.  The scope was pulled down into the  rectum.  Thorough examination of the rectal mucosa in retroflexion of  the anal verge demonstrated a tiny, diminutive polyp; this was cold  biopsied.  The remainder of the rectal mucosa appeared normal.  The  patient tolerated the procedure well and was reacted in endoscopy.   IMPRESSION:  Diminutive rectal polyp, status post removal as described  above.  Otherwise, normal rectum and , normal colon.   RECOMMENDATIONS:  1. Follow-up on pathology.  2. Further recommendations to follow.      Jonathon Bellows, M.D.  Electronically Signed    RMR/MEDQ  D:  10/01/2006  T:  10/03/2006  Job:  [id number]   cc:   [facility]. Lodema Hong, M.D.  Fax: [phone]

## 2010-06-27 ENCOUNTER — Other Ambulatory Visit: Payer: Self-pay | Admitting: Family Medicine

## 2010-06-27 NOTE — H&P (Signed)
[place]. [facility]  Patient:    Pamela Stein, Pamela Stein Visit Number: [id number] MRN: [id number]          Service Type: DSU Location: 3300 [phone] Attending Physician:  Waldon Merl Dictated by:   Keturah Barre, M.D. Admit Date:  07/21/2001 Discharge Date: 07/22/2001                           History and Physical  HISTORY OF PRESENT ILLNESS:  This patient is a 55 year old female who has had sleep apnea problems.  She has been on CPAP since 1992.  She has done reasonably well with this but she just is having difficulties with it because she has a septal deviation and she just feels that it is not working very well for her.  She wishes to see if she can get the problem solved so she can avoid CPAP.  She got a sleep study done.  She is 5 feet 2 inches and weighs 243 and her sleep study showed a stage 3 and 4 and REM in sleep were only at 13% of the time.  She was also awake 31 times, awakening more than 2 minutes 9 times, and she had an respiratory disturbance index of 45.  Her lowest O2 was 81% saturation.  She had no cardiac arrhythmias and she had considerable apnea and snoring.  She now enters for a septal reconstruction, turbinate reduction, palatopharyngoplasty and tonsillectomy.  PAST MEDICAL HISTORY:  Her past history is one where she has had Menieres and has been on Hygroton and a potassium.  She has also had a partial hysterectomy in 1998, endometrial ablation in 1992 and D&Cs in 1975, 1977, 1978 and 1981. She has had an irregular heart beat in the past and as stated, she had a history of murmur, but I heard none today.  She has had a history of bronchitis and of course the sleep apnea.  She has had a carpal tunnel syndrome history and has had migraine history in the past.  She has been in the emergency room for vertigo at times and I think this is when she did not take her diuretic and was given meclizine.  A CAT scan was obtained  which showed no intracranial abnormalities; she did have a bony change in the frontal sinus, left.  PHYSICAL EXAMINATION:  GENERAL:  She is a well-nourished, 243-pounds, 5-foot 2-inch female with a blood pressure of 130/90 and a pulse of 80.  HEENT:  Her ears are clear.  Tympanic membranes are clear of any abnormalities.  Her nose shows a septal deviation, primarily high ethmoid, and she has got turbinate hypertrophy of a considerable amount blocking the nose. Her oral cavity is quite small.  Her tonsils are large and obstructive in nature and her palate is also redundant, as well as the uvula is approximately three times the normal size.  NECK:  Her neck is full and free of any thyromegaly, cervical adenopathy or mass.  CHEST:  Clear.  No rales, rhonchi or wheezes.  CARDIOVASCULAR:  No opening snaps, murmurs or gallops.  ABDOMEN:  Obese.  EXTREMITIES:  Within normal limits.  INITIAL DIAGNOSES: 1. Sleep apnea, septal deviation, turbinate hypertrophy, tonsillar hypertrophy    with a small oral cavity, 10-year history of continuous positive airway    pressure with partial success. 2. History of partial hysterectomy. 3. History of dilatations and curettages x4 and endometrial ablation, maybe  two. 4. History of bronchitis. 5. Irregular heart beat. 6. Carpal tunnel. 7. History of Menieres. Dictated by:   Keturah Barre, M.D. Attending Physician:  Waldon Merl DD:  07/21/01 TD:  07/23/01 Job: [id number]

## 2010-06-27 NOTE — Procedures (Signed)
NAMEDUCHESS, ARMENDAREZ            ACCOUNT NO.:  [id number]   MEDICAL RECORD NO.:  [id number]          PATIENT TYPE:  REC   LOCATION:  RAD                           FACILITY:  [facility]   PHYSICIAN:  Meade Bing, M.D. [facility] OF BIRTH:  [birth year]   DATE OF PROCEDURE:  12/19/2004  DATE OF DISCHARGE:                                  ECHOCARDIOGRAM   CLINICAL DATA:  A 55 year old woman with chest pain and EKG abnormalities.   M-MODE TRACINGS:  Aorta 2.6, left atrium 4.6, septum 1.6, posterior wall  1.3, LV diastole 4.1, LV systole 2.1.   FINDINGS:  1.  Technically adequate echocardiographic study.  2.  Left atrial size at the upper limit of normal to slightly increased;      normal right atrium and right ventricle.  3.  Mild to moderate aortic sclerosis of a trileaflet valve; mild      calcification of the annulus.  4.  Normal mitral valve; mild annular calcification.  5.  Normal tricuspid valve.  6.  Pulmonic valve and proximal pulmonary artery not ideally imaged, appear      normal.  7.  Normal left ventricular size; mild to moderate hypertrophy with      disproportionate thickening of the upper septum.  Normal regional and      global function.  8.  Mild dilatation of the inferior vena cava.      [place] Bing, M.D. [facility]  Electronically Signed     RR/MEDQ  D:  12/19/2004  T:  12/19/2004  Job:  [id number]

## 2010-06-27 NOTE — Op Note (Signed)
[place]. [facility]  Patient:    Pamela, Stein Visit Number: [id number] MRN: [id number]          Service Type: DSU Location: 3300 [phone] Attending Physician:  Waldon Merl Dictated by:   Keturah Barre, M.D. Proc. Date: 07/21/01 Admit Date:  07/21/2001 Discharge Date: 07/22/2001   CC:         [facility]   Operative Report  PREOPERATIVE DIAGNOSIS:  Sleep apnea with septal deviation, turbinate hypertrophy, with failure of CPAP, history of CPAP use for 10 years.  POSTOPERATIVE DIAGNOSIS:  Sleep apnea with septal deviation, turbinate hypertrophy, with failure of CPAP, history of CPAP use for 10 years.  OPERATION:  Septal reconstruction, turbinate reduction, palatopharyngoplasty, uvulopalatopharyngoplasty, and tonsillectomy.  SURGEON:  Keturah Barre, M.D.  ANESTHESIA:  General orotracheal with Guadalupe Maple, M.D.  DESCRIPTION OF PROCEDURE:  The patient was placed in the supine position and under general orotracheal anesthesia, the patient was prepped and draped in the appropriate using Hibiclens x3 and the usual head drape.  The nose was first approached and after 1% Xylocaine with epinephrine, 3-4 cc were used, and topical cocaine 200 mg was used.  The inferior turbinates were addressed with the outfracture.  No mucous membrane was removed.  The bony structure was lateralized considerably and we were able to achieve that quite well.  A hemitransfixion incision was made on the right side of the septum and carried back along the quadrilateral cartilage where the more posterior aspect of a strip of cartilage was taken as was an ethmoid deviation, and the ethmoid was also corrected using the open and closed Jansen-Middletons to bring the septum back to the midline.  The vomerine septum was not as severe.  There was a small area we removed on the inferior aspect of the high ethmoid which was the problem area.  It was  responsible for failure of the CPAP.  Once this was completed, then closure was with 5-0 plain catgut and through-and-through simple sutures of 4-0 plain catgut as a hemostatic suture. Then, Telfa was placed in the nose, and attention was then turned to the tonsillar pharyngeal region.  The tonsillar gag was placed.  The oral cavity was really quite small as well as the oropharynx and the tonsils were protuberant.  The tonsils were removed using Bovie electrocoagulation blunt resection.  All hemostasis was established with Bovie electrocoagulation and the tonsils gained removal which also gained [place] considerable space.  The uvula and palate was then trimmed appropriately, and the uvula was approximately three times its normal size, and this was trimmed through a much more amount of size.  Closure of that mucous membrane, anterior to posterior was with 5-0 plain catgut.  Once this was achieved and all hemostasis was established, the stomach was suctioned.  The Telfa was then removed from the nose and anesthesia trumpets were placed and the patients airway was stable.  The patient was awakened without difficulty and did very well during and postoperatively in his recovery room.  Followup will be in the intensive step-down unit at 3300 and then it will be in five days, then ten days, two weeks, six weeks, six months, and then a year. Dictated by:   Keturah Barre, M.D. Attending Physician:  Waldon Merl DD:  07/21/01 TD:  07/23/01 Job: [id number]

## 2010-06-27 NOTE — Procedures (Signed)
Pamela Stein, Pamela Stein            ACCOUNT NO.:  [id number]   MEDICAL RECORD NO.:  [id number]          PATIENT TYPE:  OUT   LOCATION:  SLEEP CENTER                 FACILITY:  [facility]   PHYSICIAN:  Clinton D. Maple Hudson, M.D. DATE OF BIRTH:  [birth year]   DATE OF STUDY:  01/12/2005                              NOCTURNAL POLYSOMNOGRAM   REFERRING PHYSICIAN:  Dr. Jetty Duhamel.   DATE OF STUDY:  January 12, 2005.   INDICATION FOR STUDY:  Hypersomnia with sleep apnea. Epworth sleepiness  score 13/24, BMI 43, weight 247 pounds.   SLEEP ARCHITECTURE:  Total sleep time 411 minutes with sleep efficiency 92%.  Stage I was 2%, stage II 85%, stages III and IV 10%, REM 2% of total sleep  time. Sleep latency 10 minutes, REM latency 35 minutes, awake after sleep  onset 26 minutes, arousal index 5.7. Bedtime medications included  hydrochlorothiazide and baby aspirin.   RESPIRATORY DATA:  Apnea hypopnea index (AHI, RDI) 12.8 obstructive events  per hour indicating mild obstructive sleep apnea/hypopnea syndrome. There  were 6 obstructive apneas and 82 hypopneas. Events were equally frequent  supine and on left side. REM AHI 73 per hour. There were insufficient early  events to permit CPAP titration by split protocol on this study night.   OXYGEN DATA:  Loud snoring with oxygen desaturation to a nadir of 80%. Mean  oxygen saturation through the study was 93% on room air.   CARDIAC DATA:  Normal sinus rhythm.   MOVEMENT/PARASOMNIA:  Occasional leg jerk with little effect on sleep.   IMPRESSION/RECOMMENDATION:  1.  Mild obstructive sleep apnea/hypopnea syndrome, apnea hypopnea index      12.8 per hour. Events were not positional but more common in rapid eye      movement. There was loud snoring with oxygen desaturation to 80%.  2.  If more conservative therapies including weight loss and treatment for      any nasal congestion are insufficient and there is evidence of daytime      sleepiness,  hypertension or other disruptive effects of sleep apnea      common      then a trial of continuous positive airway pressure would be reasonable      and the patient can return for continuous positive airway pressure      titration if appropriate.      Clinton D. Maple Hudson, M.D.  Diplomate, Biomedical engineer of Sleep Medicine  Electronically Signed     CDY/MEDQ  D:  01/18/2005 09:57:23  T:  01/18/2005 22:30:20  Job:  [id number]

## 2010-08-25 ENCOUNTER — Encounter: Payer: Self-pay | Admitting: Family Medicine

## 2010-08-26 ENCOUNTER — Other Ambulatory Visit: Payer: Self-pay | Admitting: Family Medicine

## 2010-09-01 ENCOUNTER — Encounter: Payer: Self-pay | Admitting: Family Medicine

## 2010-09-01 ENCOUNTER — Ambulatory Visit (INDEPENDENT_AMBULATORY_CARE_PROVIDER_SITE_OTHER): Payer: BC Managed Care – PPO | Admitting: Family Medicine

## 2010-09-01 VITALS — BP 120/80 | HR 78 | Resp 16 | Ht 62.25 in | Wt 221.0 lb

## 2010-09-01 DIAGNOSIS — B351 Tinea unguium: Secondary | ICD-10-CM

## 2010-09-01 DIAGNOSIS — R5383 Other fatigue: Secondary | ICD-10-CM

## 2010-09-01 DIAGNOSIS — E669 Obesity, unspecified: Secondary | ICD-10-CM

## 2010-09-01 DIAGNOSIS — R5381 Other malaise: Secondary | ICD-10-CM

## 2010-09-01 DIAGNOSIS — E119 Type 2 diabetes mellitus without complications: Secondary | ICD-10-CM

## 2010-09-01 DIAGNOSIS — I10 Essential (primary) hypertension: Secondary | ICD-10-CM

## 2010-09-01 DIAGNOSIS — Z13820 Encounter for screening for osteoporosis: Secondary | ICD-10-CM

## 2010-09-01 DIAGNOSIS — E785 Hyperlipidemia, unspecified: Secondary | ICD-10-CM

## 2010-09-01 NOTE — Progress Notes (Signed)
  Subjective:    Patient ID: Pamela Stein, female    DOB: Jul 05, 1955, 55 y.o.   MRN: [id number]  HPI Left trigger thumb x 1 year, declines ortho evaL at this time HYPERTENSION  Disease Monitoring  Blood pressure range-120 to 140 /80 Chest pain- no      Dyspnea- no  Compliance- good Lightheadedness- no   Edema- no  DIABETES  Disease Monitoring  Blood Sugar ranges-90 to 120 fasting Polyuria- no New Visual problems- no  Compliance- good Hypoglycemic symptoms- no  Last eye exam: past due       Podiatry visit: no  HYPERLIPIDEMIA  Compliance- good RUQ pain- no  Muscle aches- no   REGULAR EXERCISE-no       Review of Systems See HPI Denies recent fever or chills. Denies sinus pressure, nasal congestion, ear pain or sore throat. Denies chest congestion, productive cough or wheezing. Denies chest pains, palpitations and leg swelling Denies abdominal pain, nausea, vomiting,diarrhea or constipation.   Denies dysuria, frequency, hesitancy or incontinence. Denies joint pain, swelling and limitation in mobility. Denies headaches, seizures, numbness, or tingling. Denies depression, anxiety or insomnia. Denies skin break down or rash.        Objective:   Physical Exam Patient alert and oriented and in no cardiopulmonary distress.  HEENT: No facial asymmetry, EOMI, no sinus tenderness,  oropharynx pink and moist.  Neck supple no adenopathy.  Chest: Clear to auscultation bilaterally.  CVS: S1, S2 no murmurs, no S3.  ABD: Soft non tender. Bowel sounds normal.  Ext: No edema  MS: Adequate ROM spine, shoulders, hips and knees.  Skin: Intact, no ulcerations or rash noted.  Psych: Good eye contact, normal affect. Memory intact not anxious or depressed appearing.  CNS: CN 2-12 intact, power, tone and sensation normal throughout. Diabetic Foot Check:  Appearance - no ulcers,  Calluses present Skin - no unusual pallor or redness Sensation - grossly intact to  light touch Monofilament testing -  Right - Great toe, medial, central, lateral ball and posterior foot diminished Left - Great toe, medial, central, lateral ball and posterior foot diminished Pulses Left - Dorsalis Pedis and Posterior Tibia normal Right - Dorsalis Pedis and Posterior Tibia normal        Assessment & Plan:

## 2010-09-01 NOTE — Patient Instructions (Addendum)
F/u in 4 months.  CBC fasting labs this week  microalbumin from thee office today    hBA1c  Non fasting  pls sched eye exam  It is important that you exercise regularly at least 30 minutes 5 times a week. If you develop chest pain, have severe difficulty breathing, or feel very tired, stop exercising immediately and seek medical attention    A healthy diet is rich in fruit, vegetables and whole grains. Poultry fish, nuts and beans are a healthy choice for protein rather then red meat. A low sodium diet and drinking 64 ounces of water daily is generally recommended. Oils and sweet should be limited. Carbohydrates especially for those who are diabetic or overweight, should be limited to 34-45 gram per meal. It is important to eat on a regular schedule, at least 3 times daily. Snacks should be primarily fruits, vegetables or nuts.

## 2010-09-02 ENCOUNTER — Other Ambulatory Visit: Payer: Self-pay | Admitting: Family Medicine

## 2010-09-03 LAB — MICROALBUMIN / CREATININE URINE RATIO
Creatinine, Urine: 155.2 mg/dL
Microalb Creat Ratio: 3.9 mg/g (ref 0.0–30.0)

## 2010-09-06 LAB — CBC WITH DIFFERENTIAL/PLATELET
Basophils Relative: 1 % (ref 0–1)
Eosinophils Absolute: 0.2 10*3/uL (ref 0.0–0.7)
HCT: 37.3 % (ref 36.0–46.0)
Hemoglobin: 11.6 g/dL — ABNORMAL LOW (ref 12.0–15.0)
MCH: 21.2 pg — ABNORMAL LOW (ref 26.0–34.0)
MCHC: 31.1 g/dL (ref 30.0–36.0)
Monocytes Absolute: 0.7 10*3/uL (ref 0.1–1.0)
Monocytes Relative: 6 % (ref 3–12)
Neutrophils Relative %: 58 % (ref 43–77)
RDW: 17.6 % — ABNORMAL HIGH (ref 11.5–15.5)

## 2010-09-06 LAB — LIPID PANEL: Total CHOL/HDL Ratio: 4.1 Ratio

## 2010-09-07 LAB — COMPLETE METABOLIC PANEL WITH GFR
Albumin: 4.3 g/dL (ref 3.5–5.2)
Alkaline Phosphatase: 78 U/L (ref 39–117)
CO2: 25 mEq/L (ref 19–32)
GFR, Est Non African American: 55 mL/min — ABNORMAL LOW (ref >60)
Glucose, Bld: 93 mg/dL (ref 70–99)
Potassium: 4 mEq/L (ref 3.5–5.3)
Sodium: 139 mEq/L (ref 135–145)
Total Protein: 7.8 g/dL (ref 6.0–8.3)

## 2010-09-07 NOTE — Assessment & Plan Note (Signed)
Controlled, no change in medication  

## 2010-09-07 NOTE — Assessment & Plan Note (Signed)
Unchanged, needs to work on this

## 2010-09-07 NOTE — Assessment & Plan Note (Signed)
Improved , encourage daily vicks topically

## 2010-09-07 NOTE — Assessment & Plan Note (Signed)
rept labs needed, pt to current medication aND encouraged to follow low fat diet

## 2010-09-10 ENCOUNTER — Telehealth: Payer: Self-pay | Admitting: Family Medicine

## 2010-09-10 LAB — VITAMIN D 1,25 DIHYDROXY: Vitamin D3 1, 25 (OH)2: 38 pg/mL

## 2010-09-10 NOTE — Telephone Encounter (Signed)
Spoke with patient and gave lab results 

## 2010-09-10 NOTE — Progress Notes (Signed)
Addended by: Adella Hare B on: 09/10/2010 10:30 AM   Modules accepted: Orders

## 2010-09-16 ENCOUNTER — Telehealth: Payer: Self-pay | Admitting: Family Medicine

## 2010-09-16 MED ORDER — GLUCOSE BLOOD VI STRP: ORAL_STRIP | Status: DC

## 2010-09-16 NOTE — Telephone Encounter (Signed)
Sent to the pharmacy

## 2010-09-23 ENCOUNTER — Other Ambulatory Visit (HOSPITAL_COMMUNITY)
Admission: RE | Admit: 2010-09-23 | Discharge: 2010-09-23 | Disposition: A | Discharge status: Home / Self Care | Payer: BC Managed Care – PPO | Source: Ambulatory Visit | Attending: Obstetrics and Gynecology | Admitting: Obstetrics and Gynecology

## 2010-09-23 ENCOUNTER — Other Ambulatory Visit: Payer: Self-pay | Admitting: Adult Health

## 2010-09-23 DIAGNOSIS — Z01419 Encounter for gynecological examination (general) (routine) without abnormal findings: Secondary | ICD-10-CM | POA: Insufficient documentation

## 2010-12-01 ENCOUNTER — Other Ambulatory Visit: Payer: Self-pay | Admitting: Family Medicine

## 2010-12-01 ENCOUNTER — Telehealth: Payer: Self-pay | Admitting: Family Medicine

## 2010-12-01 DIAGNOSIS — M79643 Pain in unspecified hand: Secondary | ICD-10-CM

## 2010-12-01 NOTE — Telephone Encounter (Signed)
pls refer pt to hand specialist in [place] asdap, eg dr Merlyn Lot, gramig , check names in directory, let her know

## 2010-12-02 NOTE — Telephone Encounter (Signed)
Pt has appt with dr. Amanda Pea for 01/19/2011 9:30. Pt is aware

## 2010-12-05 ENCOUNTER — Other Ambulatory Visit: Payer: Self-pay | Admitting: Family Medicine

## 2010-12-08 ENCOUNTER — Encounter: Payer: Self-pay | Admitting: Family Medicine

## 2010-12-08 ENCOUNTER — Emergency Department (HOSPITAL_COMMUNITY): Payer: Federal, State, Local not specified - PPO

## 2010-12-08 ENCOUNTER — Emergency Department (HOSPITAL_COMMUNITY)
Admission: EM | Admit: 2010-12-08 | Discharge: 2010-12-08 | Disposition: A | Discharge status: Home / Self Care | Payer: Federal, State, Local not specified - PPO | Attending: Emergency Medicine | Admitting: Emergency Medicine

## 2010-12-08 ENCOUNTER — Encounter (HOSPITAL_COMMUNITY): Payer: Self-pay | Admitting: *Deleted

## 2010-12-08 ENCOUNTER — Ambulatory Visit (INDEPENDENT_AMBULATORY_CARE_PROVIDER_SITE_OTHER): Payer: Federal, State, Local not specified - PPO | Admitting: Family Medicine

## 2010-12-08 DIAGNOSIS — E785 Hyperlipidemia, unspecified: Secondary | ICD-10-CM | POA: Insufficient documentation

## 2010-12-08 DIAGNOSIS — Z6839 Body mass index (BMI) 39.0-39.9, adult: Secondary | ICD-10-CM | POA: Insufficient documentation

## 2010-12-08 DIAGNOSIS — E669 Obesity, unspecified: Secondary | ICD-10-CM | POA: Insufficient documentation

## 2010-12-08 DIAGNOSIS — M79609 Pain in unspecified limb: Secondary | ICD-10-CM

## 2010-12-08 DIAGNOSIS — X500XXA Overexertion from strenuous movement or load, initial encounter: Secondary | ICD-10-CM | POA: Insufficient documentation

## 2010-12-08 DIAGNOSIS — R05 Cough: Secondary | ICD-10-CM

## 2010-12-08 DIAGNOSIS — R059 Cough, unspecified: Secondary | ICD-10-CM

## 2010-12-08 DIAGNOSIS — M79671 Pain in right foot: Secondary | ICD-10-CM

## 2010-12-08 DIAGNOSIS — I10 Essential (primary) hypertension: Secondary | ICD-10-CM | POA: Insufficient documentation

## 2010-12-08 DIAGNOSIS — E119 Type 2 diabetes mellitus without complications: Secondary | ICD-10-CM | POA: Insufficient documentation

## 2010-12-08 DIAGNOSIS — G4733 Obstructive sleep apnea (adult) (pediatric): Secondary | ICD-10-CM | POA: Insufficient documentation

## 2010-12-08 DIAGNOSIS — Z7982 Long term (current) use of aspirin: Secondary | ICD-10-CM | POA: Insufficient documentation

## 2010-12-08 IMAGING — CR DG FOOT COMPLETE 3+V*R*
3 series · 3 of 3 positions shown · non-contrast
Comparison: None.

CLINICAL DATA: Pain after twisting foot

RIGHT FOOT COMPLETE - 3+ VIEW

[view not recorded (1 of 3)]
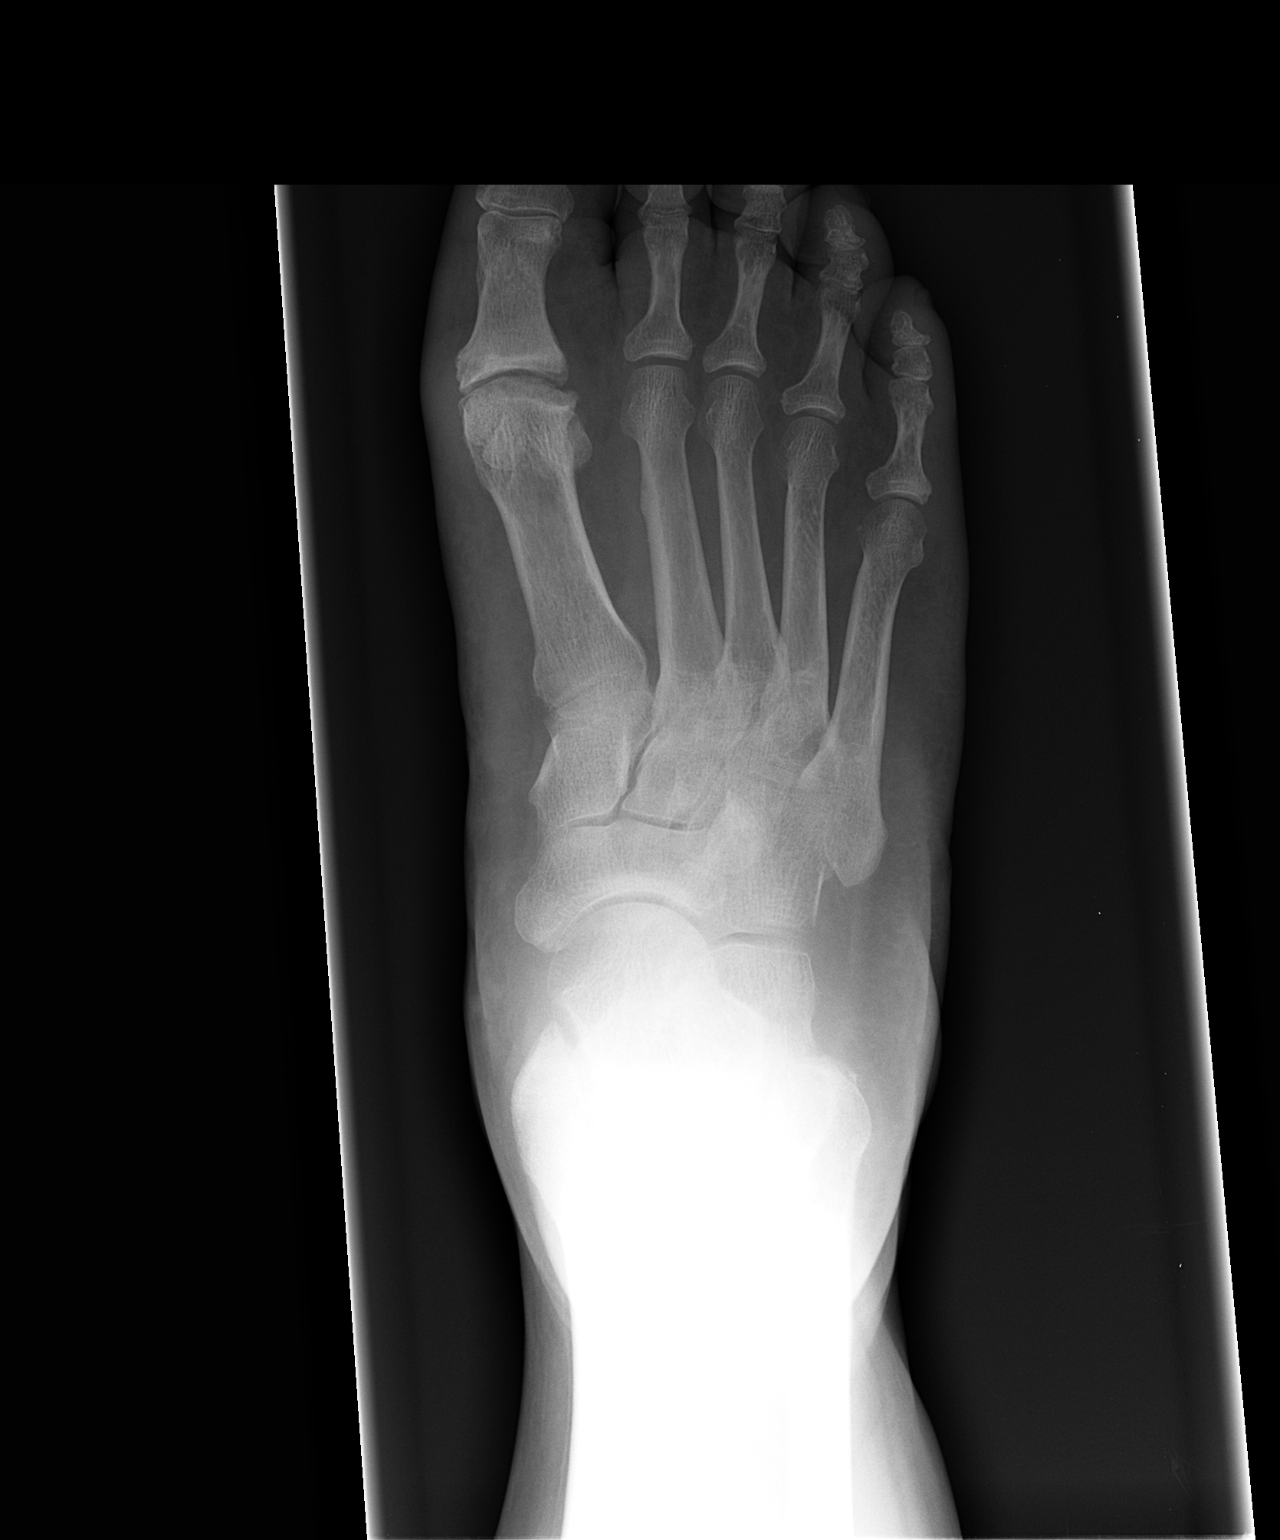

[view not recorded (2 of 3)]
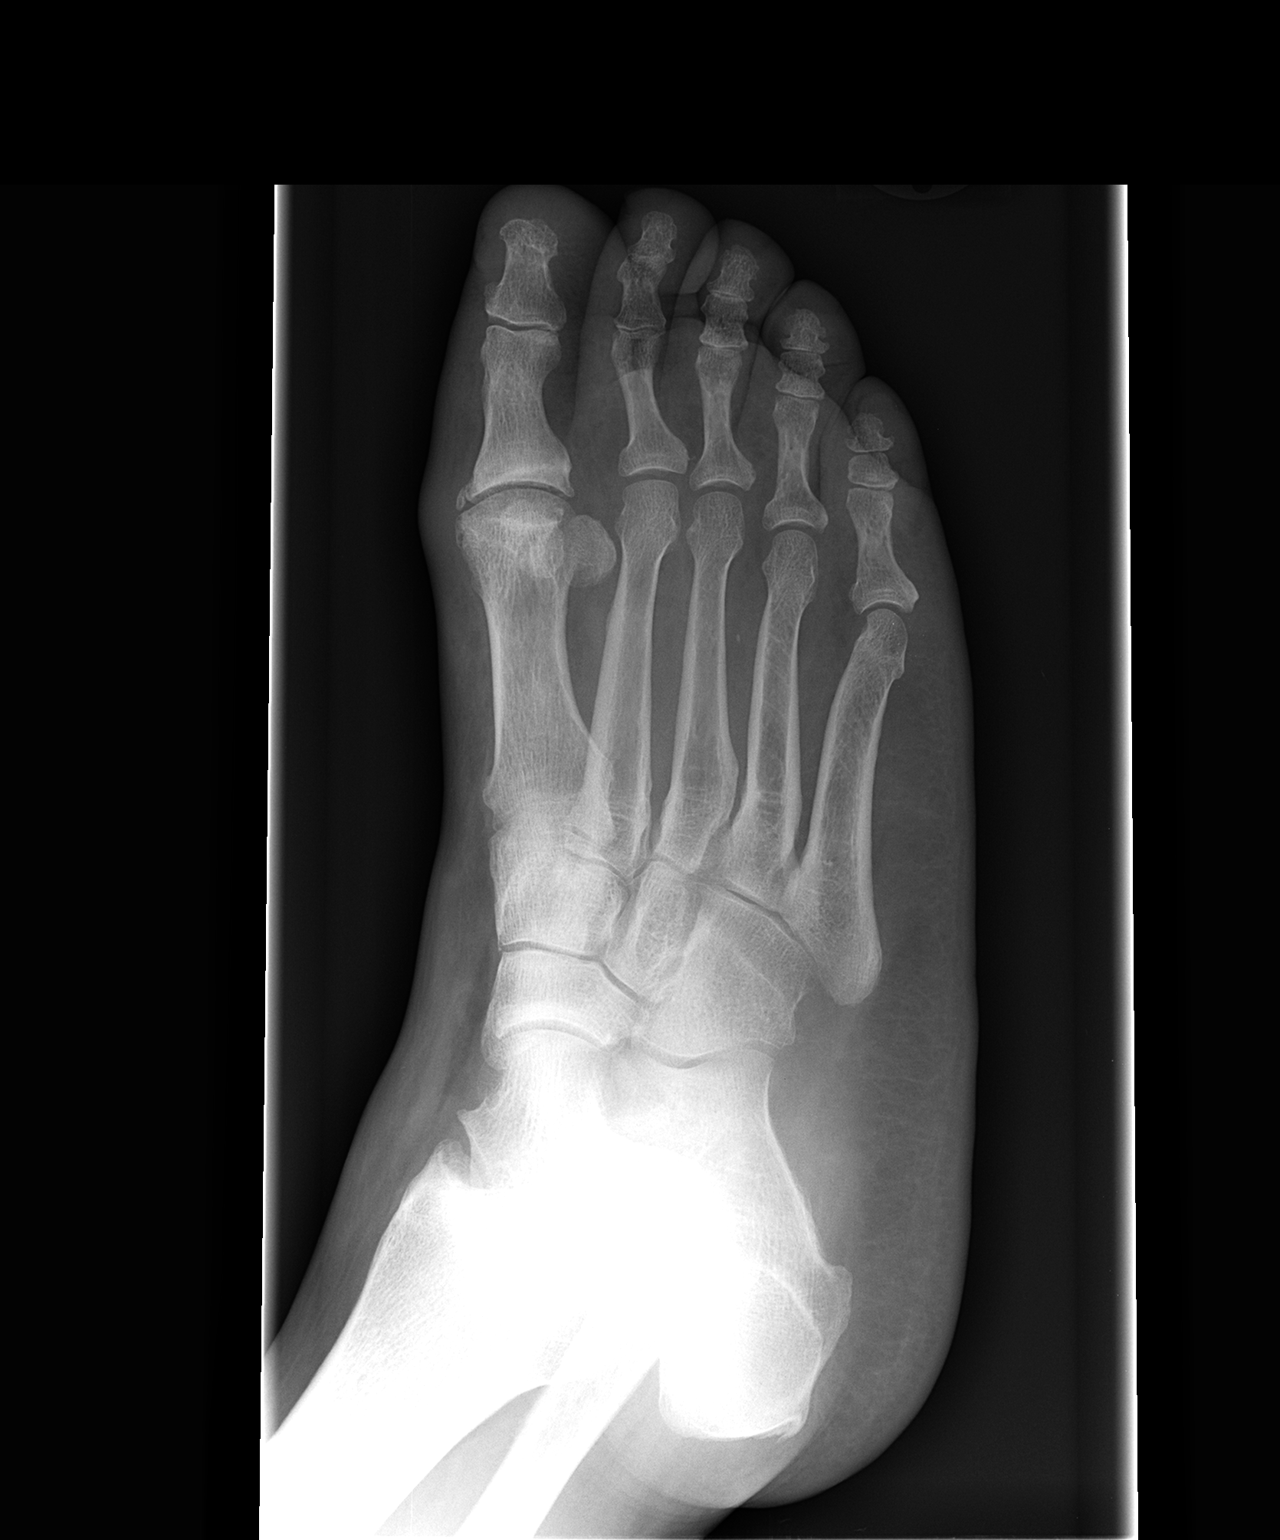

[view not recorded (3 of 3)]
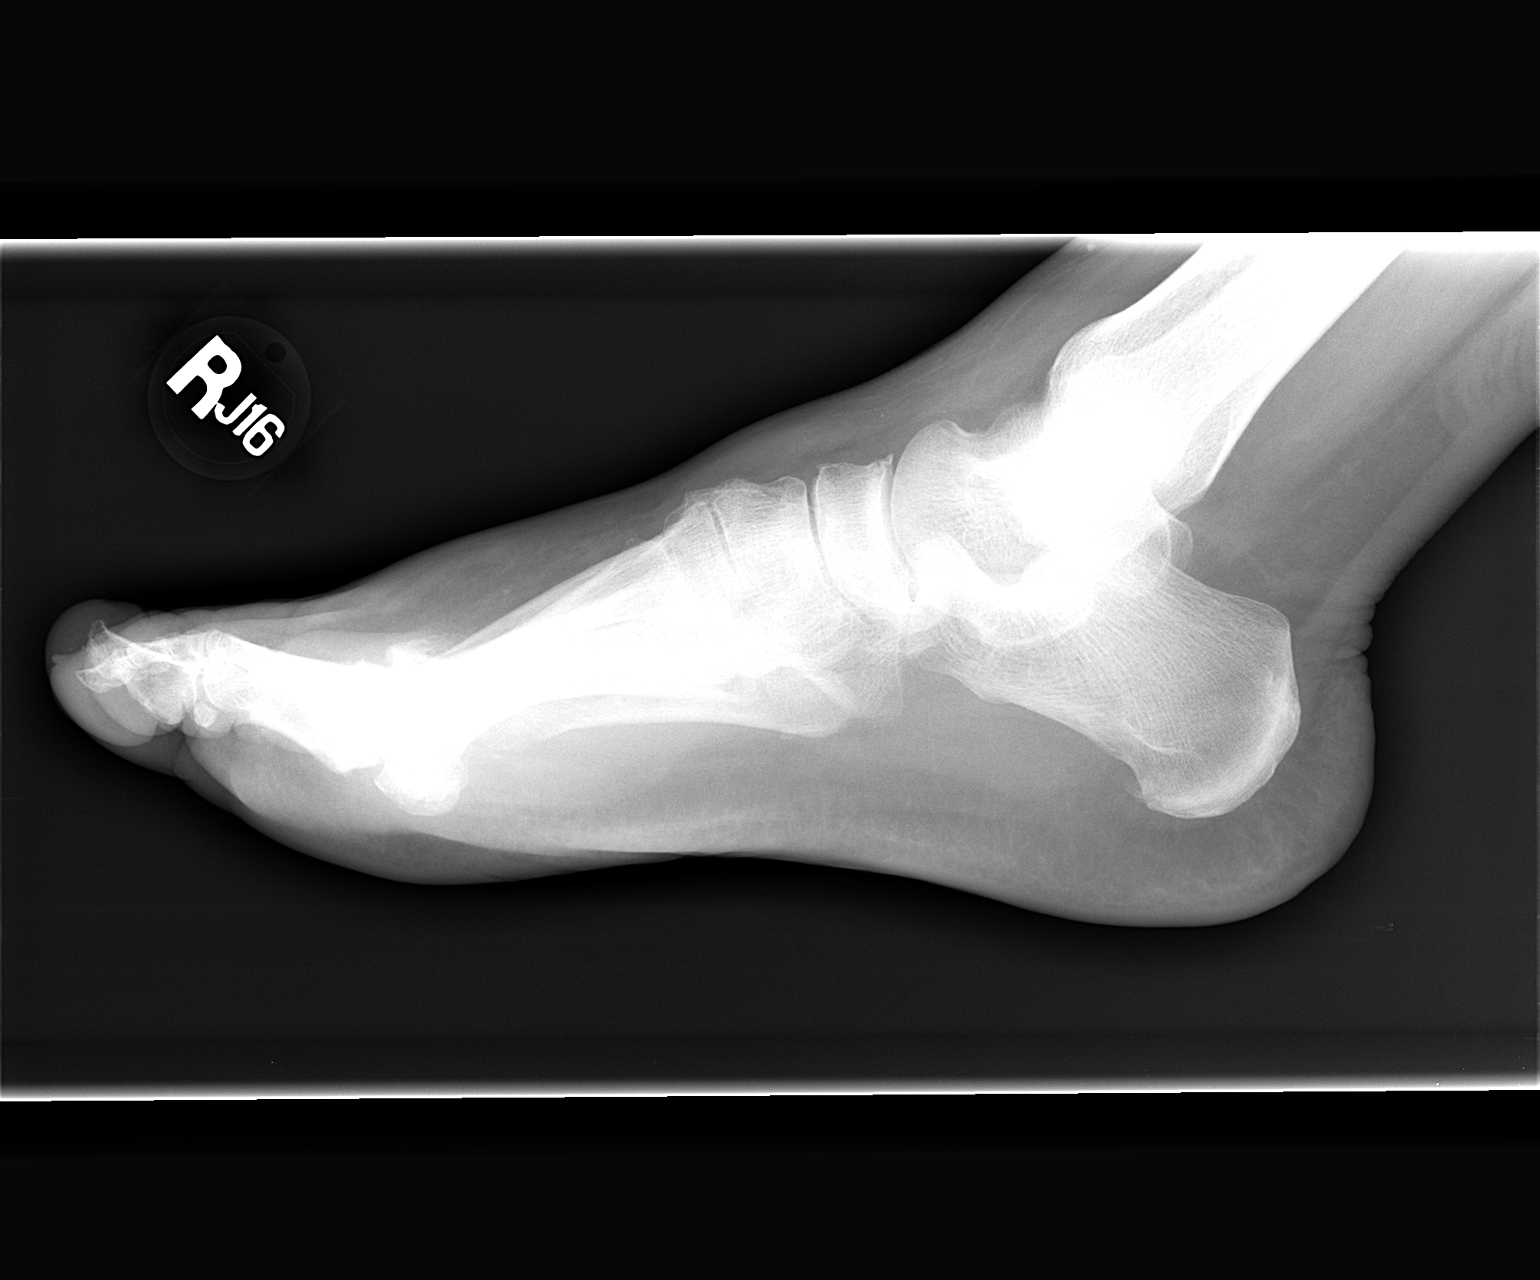

[3 of 3 positions shown; findings below may reference images not displayed]

FINDINGS: No evidence of fracture or dislocation of the right foot.
There is some swelling over the forefoot.  No joint effusion.
IMPRESSION: No evidence of fracture.

## 2010-12-08 MED ORDER — OXYCODONE-ACETAMINOPHEN 5-325 MG PO TABS: 1.0000 | ORAL_TABLET | Freq: Four times a day (QID) | ORAL | Status: AC | PRN

## 2010-12-08 MED ORDER — IBUPROFEN 400 MG PO TABS
600.0000 mg | ORAL_TABLET | Freq: Once | ORAL | Status: AC
  Administered 2010-12-08: 08:00:00 via ORAL
  Filled 2010-12-08: qty 2

## 2010-12-08 MED ORDER — IBUPROFEN 800 MG PO TABS: 800.0000 mg | ORAL_TABLET | Freq: Three times a day (TID) | ORAL | Status: AC | PRN

## 2010-12-08 MED ORDER — KETOROLAC TROMETHAMINE 60 MG/2ML IM SOLN
60.0000 mg | Freq: Once | INTRAMUSCULAR | Status: AC
  Administered 2010-12-08: 60 mg via INTRAMUSCULAR

## 2010-12-08 NOTE — Progress Notes (Signed)
  Subjective:    Patient ID: Pamela Stein, female    DOB: [birth year], 55 y.o.   MRN: [id number]  HPI 5 days ago pt reports twisting the ball of her foot espescialy under the right great toe. She has worked since then, which involves a lot of walking, however, today states that the pain and swelling under her right great toe and the ball of her foot across the metatarsals was so severe, she had to be seen in the Ed and is  Now being seen here at their recommendation.\Tearful in pain, states the ibuprofen administered in the ED helped she has a script for percocet. C/o pain radiating anteriorly up the foot to just below knee, feels tight in the foot and notes veins look swollen/. Prior to this reports she had been doing well. Fasting blood sugars are reportedly between 115 to 130   Review of Systems See HPI Denies recent fever or chills. Denies sinus pressure, nasal congestion, ear pain or sore throat. Denies chest congestion, productive cough or wheezing. Denies chest pains, palpitations and leg swelling Denies abdominal pain, nausea, vomiting,diarrhea or constipation.   Denies dysuria, frequency, hesitancy or incontinence.  Denies headaches, seizures, numbness, or tingling. Denies depression, anxiety or insomnia. Denies skin break down or rash.        Objective:   Physical Exam  Patient alert and oriented and in no cardiopulmonary distress.tearful  HEENT: No facial asymmetry, EOMI, no sinus tenderness,  oropharynx pink and moist.  Neck supple no adenopathy.  Chest: Clear to auscultation bilaterally.  CVS: S1, S2 no murmurs, no S3.  ABD: Soft non tender. Bowel sounds normal.  Ext: No edema  MS: Adequate ROM spine, shoulders, hips and knees.Decreased ROM wit swelling and tenderness of right foot, no bruising, erythema or warmth noted  Skin: Intact, no ulcerations or rash noted.  Psych: Good eye contact, normal affect. Memory intact anxious and  depressed  appearing.  CNS: CN 2-12 intact, power, tone and sensation normal throughout.      Assessment & Plan:

## 2010-12-08 NOTE — ED Notes (Signed)
Pt c/o pain to her right foot since Thursday. States that she stepped on it wrong and twisted it. Pt has swelling to right foot. States that it hurts to put pressure on it.

## 2010-12-08 NOTE — ED Notes (Signed)
Placed a large post op shoe on pt's rt foot.

## 2010-12-08 NOTE — Patient Instructions (Addendum)
F/u in 5 weeks.  LABWORK  NEEDS TO BE DONE BETWEEN 3 TO 7 DAYS BEFORE YOUR NEXT SCEDULED  VISIT.  THIS WILL IMPROVE THE QUALITY OF YOUR CARE.   Fasting lipid, cmp and eGFR and hBA1C in 5 weeks.  You will get toradol injection in the office for the pain.  Take only one (1) additional ibuprofen tablet tonight around 10pm. A script is being sent in for ibuprofen. You are referred to orthopedics for f/u of the injury  Work excuse today until evaluated by ortho in am

## 2010-12-08 NOTE — ED Provider Notes (Signed)
History   This chart was scribed for Ethelda Chick, MD by Clarita Crane. The patient was seen in room APA03/APA03 and the patient's care was started at 7:41PM.   CSN: [id number] Arrival date & time: 12/08/2010  7:30 AM   First MD Initiated Contact with Patient 12/08/10 [phone]      Chief Complaint  Patient presents with  . Foot Injury   HPI Pamela Stein is a 55 y.o. female who presents to the Emergency Department complaining of constant moderate to severe right foot pain which patient localizes to dorsal aspect of distal right foot radiating to right midfoot onset 4 days ago and persistent since with associated mild swelling of right foot. Patient reports she twisted her right foot while walking 4 days ago with onset of pain described as burning following the incident but denies right ankle pain. Patient notes pain is aggravated by walking and placing pressure on right foot and relieved by application of a cold compress. Denies fever, chills, numbness, tingling.   Past Medical History  Diagnosis Date  . Diabetes mellitus   . Hyperlipidemia   . Hypertension   . Obesity   . Obstructive sleep apnea   . Back pain     Past Surgical History  Procedure Date  . Abdominal hysterectomy   . Dilation and curettage of uterus   . Endometrial ablation   . Tonsillectomy     Family History  Problem Relation Age of Onset  . Cancer Father     throat    History  Substance Use Topics  . Smoking status: Never Smoker   . Smokeless tobacco: Not on file  . Alcohol Use: No    OB History    Grav Para Term Preterm Abortions TAB SAB Ect Mult Living                  Review of Systems 10 Systems reviewed and are negative for acute change except as noted in the HPI.  Allergies  Review of patient's allergies indicates no known allergies.  Home Medications   Current Outpatient Rx  Name Route Sig Dispense Refill  . ASPIRIN 81 MG PO TABS Oral Take 81 mg by mouth daily.      Marland Kitchen GLUCOSE  BLOOD VI STRP  Use as directed for twice daily testing 100 each 10  . METFORMIN HCL 1000 MG PO TABS  TAKE ONE TABLET BY MOUTH TWICE DAILY WITH FOOD FOR BLOOD SUGAR 60 tablet 2  . SIMVASTATIN 40 MG PO TABS  TAKE ONE TABLET BY MOUTH AT BEDTIME 90 tablet 0  . TRIAMTERENE-HCTZ 37.5-25 MG PO TABS Oral Take 1 tablet by mouth daily.      Marland Kitchen CALCIUM CARBONATE-VITAMIN D 500-200 MG-UNIT PO TABS Oral Take 1 tablet by mouth 3 (three) times daily.      . MULTIVITAMINS PO TABS Oral Take 1 tablet by mouth daily.      . OXYCODONE-ACETAMINOPHEN 5-325 MG PO TABS Oral Take 1-2 tablets by mouth every 6 (six) hours as needed for pain. 15 tablet 0    BP 165/85  Pulse 74  Temp(Src) 98.5 F (36.9 C) (Oral)  Resp 20  Ht 5' 2.5" (1.588 m)  Wt 220 lb (99.791 kg)  BMI 39.60 kg/m2  SpO2 100%  Physical Exam  Nursing note and vitals reviewed. Constitutional: She is oriented to person, place, and time. She appears well-developed and well-nourished. No distress.       Uncomfortable appearing.   HENT:  Head:  Normocephalic and atraumatic.  Eyes: EOM are normal. Pupils are equal, round, and reactive to light.  Neck: Neck supple. No tracheal deviation present.  Cardiovascular: Normal rate and regular rhythm.   No murmur heard. Pulmonary/Chest: Effort normal and breath sounds normal. No respiratory distress. She has no wheezes.  Abdominal: She exhibits no distension.  Musculoskeletal: Normal range of motion.       Tender to palpation over dorsum of right foot most notably over 1st metatarsal. Pain with ROM of toes of right foot. No tenderness to palpation about right ankle.  2+ DP pulse within right foot.   Neurological: She is alert and oriented to person, place, and time. No sensory deficit.       Sensation intact within right foot.  Skin: Skin is warm and dry.  Psychiatric: She has a normal mood and affect. Her behavior is normal.    ED Course  Procedures (including critical care time)  DIAGNOSTIC  STUDIES: Oxygen Saturation is 100% on room air, normal by my interpretation.    COORDINATION OF CARE:    Labs Reviewed - No data to display Dg Foot Complete Right  12/08/2010  *RADIOLOGY REPORT*  Clinical Data: Pain after twisting foot  RIGHT FOOT COMPLETE - 3+ VIEW  Comparison: None.  Findings: No evidence of fracture or dislocation of the right foot. There is some swelling over the forefoot.  No joint effusion.  IMPRESSION: No evidence of fracture.  Original Report Authenticated By: Genevive Bi, M.D.     1. Foot pain, right       MDM  Pt with pain over top of her right foot and 1st metatarsal- states pain began after twisting her foot while walking several days ago.  Has been using ice packs which seem to help.  No pain or ttp of ankle or knee.  Xray reveals no acute findings.  Will give postop shoe for comfort and pain control.  Pt encouraged to follow up with her PMD, and given ortho contact information as well if pain continues.  Discharged with strict return precautions.  Pt agreeable with plan.      I personally performed the services described in this documentation, which was scribed in my presence. The recorded information has been reviewed and considered.    Ethelda Chick, MD 12/08/10 [phone]

## 2010-12-11 ENCOUNTER — Telehealth: Payer: Self-pay | Admitting: Family Medicine

## 2010-12-11 NOTE — Telephone Encounter (Signed)
Needles were sent this morning, can she have refill on the Tussinex?

## 2010-12-11 NOTE — Telephone Encounter (Signed)
Refill tussinex x 2 please

## 2010-12-12 DIAGNOSIS — R059 Cough, unspecified: Secondary | ICD-10-CM | POA: Insufficient documentation

## 2010-12-12 DIAGNOSIS — R05 Cough: Secondary | ICD-10-CM | POA: Insufficient documentation

## 2010-12-12 MED ORDER — CHLORPHENIRAMINE-HYDROCODONE 8-10 MG/5ML PO LQCR: 5.0000 mL | Freq: Two times a day (BID) | ORAL | Status: DC | PRN

## 2010-12-12 NOTE — Telephone Encounter (Signed)
Not on med list past or present, need directions

## 2010-12-12 NOTE — Telephone Encounter (Signed)
Med sent.

## 2010-12-12 NOTE — Telephone Encounter (Signed)
I entered hitorically, pls fax and let her know, cannot be e scribed

## 2010-12-14 NOTE — Assessment & Plan Note (Signed)
Controlled , no med change °

## 2010-12-14 NOTE — Assessment & Plan Note (Signed)
Controlled, no change in medication  

## 2010-12-14 NOTE — Assessment & Plan Note (Signed)
Acute pain and swelling following recent non contact trauma, twisted ankle, likely a sprain. Will refer to ortho for further management, er ability to work is compromised

## 2010-12-14 NOTE — Assessment & Plan Note (Signed)
LDL elevated , ;low fat diet discussed and encouraged, no med change at this time

## 2010-12-16 ENCOUNTER — Telehealth: Payer: Self-pay | Admitting: Family Medicine

## 2010-12-17 MED ORDER — CHLORPHENIRAMINE-HYDROCODONE 8-10 MG/5ML PO LQCR: 5.0000 mL | Freq: Two times a day (BID) | ORAL | Status: DC | PRN

## 2010-12-17 NOTE — Telephone Encounter (Signed)
Resent in the tussionex

## 2010-12-29 ENCOUNTER — Encounter: Payer: Self-pay | Admitting: Family Medicine

## 2010-12-31 ENCOUNTER — Other Ambulatory Visit: Payer: Self-pay | Admitting: Family Medicine

## 2010-12-31 DIAGNOSIS — Z139 Encounter for screening, unspecified: Secondary | ICD-10-CM

## 2011-01-01 LAB — COMPLETE METABOLIC PANEL WITH GFR
ALT: 16 U/L (ref 0–35)
AST: 21 U/L (ref 0–37)
Albumin: 4.6 g/dL (ref 3.5–5.2)
Calcium: 11.1 mg/dL — ABNORMAL HIGH (ref 8.4–10.5)
Chloride: 99 mEq/L (ref 96–112)
Potassium: 4.4 mEq/L (ref 3.5–5.3)

## 2011-01-01 LAB — LIPID PANEL
Cholesterol: 226 mg/dL — ABNORMAL HIGH (ref 0–200)
Total CHOL/HDL Ratio: 5.4 Ratio
VLDL: 40 mg/dL (ref 0–40)

## 2011-01-01 LAB — HEMOGLOBIN A1C
Hgb A1c MFr Bld: 6.7 % — ABNORMAL HIGH (ref <5.7)
Mean Plasma Glucose: 146 mg/dL — ABNORMAL HIGH (ref <117)

## 2011-01-05 ENCOUNTER — Encounter: Payer: Self-pay | Admitting: Family Medicine

## 2011-01-05 ENCOUNTER — Ambulatory Visit (INDEPENDENT_AMBULATORY_CARE_PROVIDER_SITE_OTHER): Payer: Federal, State, Local not specified - PPO | Admitting: Family Medicine

## 2011-01-05 ENCOUNTER — Telehealth: Payer: Self-pay | Admitting: Family Medicine

## 2011-01-05 VITALS — BP 130/80 | HR 72 | Resp 14 | Ht 62.0 in | Wt 223.8 lb

## 2011-01-05 DIAGNOSIS — E669 Obesity, unspecified: Secondary | ICD-10-CM

## 2011-01-05 DIAGNOSIS — I10 Essential (primary) hypertension: Secondary | ICD-10-CM

## 2011-01-05 DIAGNOSIS — M79671 Pain in right foot: Secondary | ICD-10-CM

## 2011-01-05 DIAGNOSIS — E119 Type 2 diabetes mellitus without complications: Secondary | ICD-10-CM

## 2011-01-05 DIAGNOSIS — E785 Hyperlipidemia, unspecified: Secondary | ICD-10-CM

## 2011-01-05 DIAGNOSIS — M79609 Pain in unspecified limb: Secondary | ICD-10-CM

## 2011-01-05 DIAGNOSIS — G4733 Obstructive sleep apnea (adult) (pediatric): Secondary | ICD-10-CM

## 2011-01-05 MED ORDER — TRIAMTERENE-HCTZ 37.5-25 MG PO TABS: ORAL_TABLET | ORAL | Status: DC

## 2011-01-05 MED ORDER — SIMVASTATIN 40 MG PO TABS: ORAL_TABLET | ORAL | Status: DC

## 2011-01-05 NOTE — Patient Instructions (Addendum)
F/U in 4  months  Your cholesterol and blood sugars  Are elevated and uncontrolled. Please change your eating , cut back on fried and fatty foods and sweets, stop using food as comfort. Cultivate hobbies and plan occasional fun times with friends  Weight loss is needed.    pls call and attend group session on eating habits. Please commit to 30 mins of exercise daily.   Stop ibuprofen, instead use tylenol /tylenol arthritis one daily as needed for right foot discomfort  Weight loss goal of 2 to 3 pounds per month  Fasting lipid, cmp, eGFR  hBA1C in 4 month

## 2011-01-05 NOTE — Progress Notes (Signed)
  Subjective:    Patient ID: Pamela Stein, female    DOB: 29-Jan-1956, 55 y.o.   MRN: [id number]  HPI The PT is here for follow up and re-evaluation of chronic medical conditions, medication management and review of any available recent lab and radiology data.  Preventive health is updated, specifically  Cancer screening and Immunization.   Questions or concerns regarding consultations or procedures which the PT has had in the interim are  addressed. The PT denies any adverse reactions to current medications since the last visit.  Still c/o right foot discomfort, not comited to regular exercise, and her stress level on the job remains high.Not much fun social interaction. Not testing sugars regularly    Review of Systems See HPI Denies recent fever or chills. Denies sinus pressure, nasal congestion, ear pain or sore throat. Denies chest congestion, productive cough or wheezing. Denies chest pains, palpitations and leg swelling Denies abdominal pain, nausea, vomiting,diarrhea or constipation.   Denies dysuria, frequency, hesitancy or incontinence.  Denies headaches, seizures, numbness, or tingling.  Denies skin break down or rash.        Objective:   Physical Exam Patient alert and oriented and in no cardiopulmonary distress.  HEENT: No facial asymmetry, EOMI, no sinus tenderness,  oropharynx pink and moist.  Neck supple no adenopathy.  Chest: Clear to auscultation bilaterally.  CVS: S1, S2 no murmurs, no S3.  ABD: Soft non tender. Bowel sounds normal.  Ext: No edema  MS: Adequate ROM spine, shoulders, hips and knees.  Skin: Intact, no ulcerations or rash noted.  Psych: Good eye contact, normal affect. Memory intact not anxious , mildly  depressed appearing.  CNS: CN 2-12 intact, power, tone and sensation normal throughout.        Assessment & Plan:

## 2011-01-06 ENCOUNTER — Telehealth: Payer: Self-pay | Admitting: Family Medicine

## 2011-01-06 NOTE — Assessment & Plan Note (Signed)
Deteriorated. Patient re-educated about  the importance of commitment to a  minimum of 150 minutes of exercise per week. The importance of healthy food choices with portion control discussed. Encouraged to start a food diary, count calories and to consider  joining a support group. Sample diet sheets offered. Goals set by the patient for the next several months.    

## 2011-01-06 NOTE — Assessment & Plan Note (Signed)
Controlled, no change in medication  

## 2011-01-06 NOTE — Assessment & Plan Note (Signed)
Uncontrolled and deteriorated, dietary change, no med change

## 2011-01-06 NOTE — Assessment & Plan Note (Signed)
Pt encouraged to use CPAP

## 2011-01-06 NOTE — Assessment & Plan Note (Signed)
Uncontrolled, closer attention to diet and increased physical activity, no med change. Pt to attend diabetic class

## 2011-01-07 ENCOUNTER — Other Ambulatory Visit: Payer: Self-pay | Admitting: Family Medicine

## 2011-01-07 DIAGNOSIS — M79671 Pain in right foot: Secondary | ICD-10-CM

## 2011-01-07 NOTE — Telephone Encounter (Signed)
Left message for patient

## 2011-01-07 NOTE — Telephone Encounter (Signed)
pls Forrestine Him this is fine and I will enter referral, pls do

## 2011-01-07 NOTE — Telephone Encounter (Signed)
pls let her know since I did not do a specific foot exam in past 6 months, when she sees the podiatrist ask him to document her need for special diabetic shoes and send me the info, I will sign for the shoes at that time.

## 2011-01-09 ENCOUNTER — Encounter: Payer: Self-pay | Admitting: Family Medicine

## 2011-02-27 ENCOUNTER — Ambulatory Visit (HOSPITAL_COMMUNITY): Payer: Federal, State, Local not specified - PPO

## 2011-02-27 ENCOUNTER — Ambulatory Visit (HOSPITAL_COMMUNITY)
Admission: RE | Admit: 2011-02-27 | Discharge: 2011-02-27 | Disposition: A | Discharge status: Home / Self Care | Payer: Federal, State, Local not specified - PPO | Source: Ambulatory Visit | Attending: Family Medicine | Admitting: Family Medicine

## 2011-02-27 DIAGNOSIS — Z139 Encounter for screening, unspecified: Secondary | ICD-10-CM

## 2011-02-27 DIAGNOSIS — Z1231 Encounter for screening mammogram for malignant neoplasm of breast: Secondary | ICD-10-CM | POA: Insufficient documentation

## 2011-02-27 IMAGING — MG MM DIGITAL SCREENING BILAT
5 series · 5 of 5 positions shown · non-contrast
Comparison: Prior studies.

DG SCREEN MAMMOGRAM BILATERAL
Bilateral CC and MLO view(s) were taken.
Technologist: [REDACTED]

DIGITAL SCREENING MAMMOGRAM WITH CAD:

[L CC (1 of 2)]
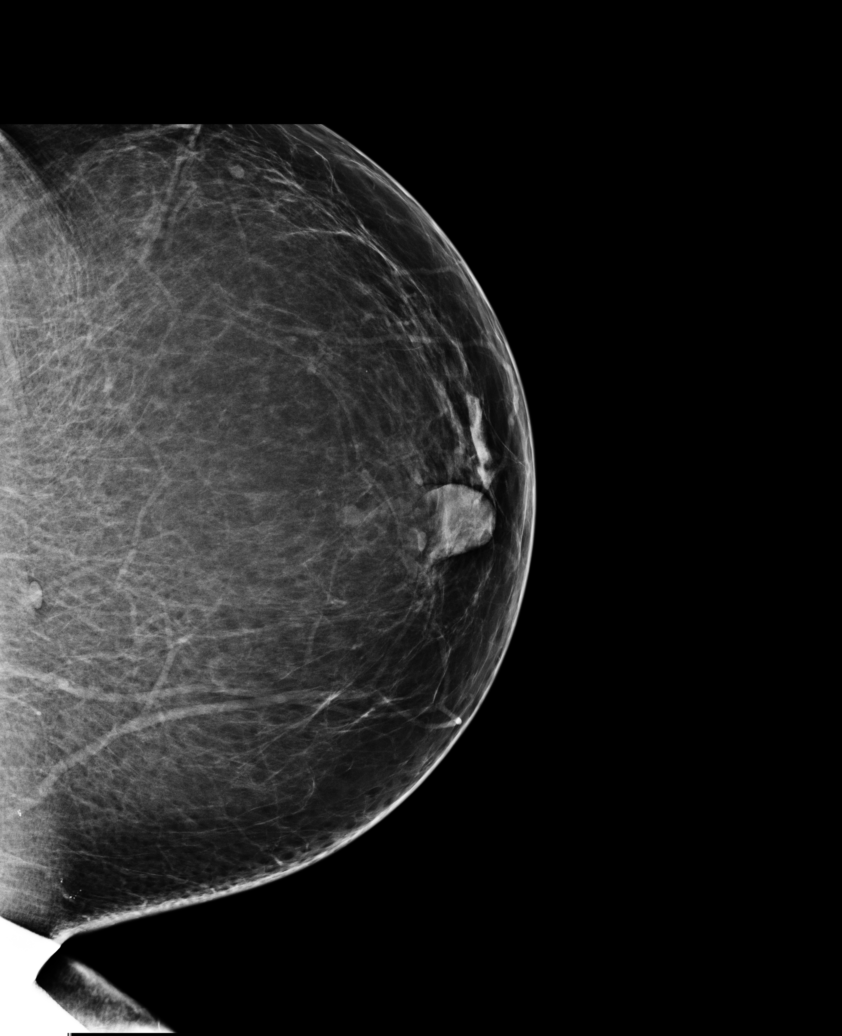

[L MLO]
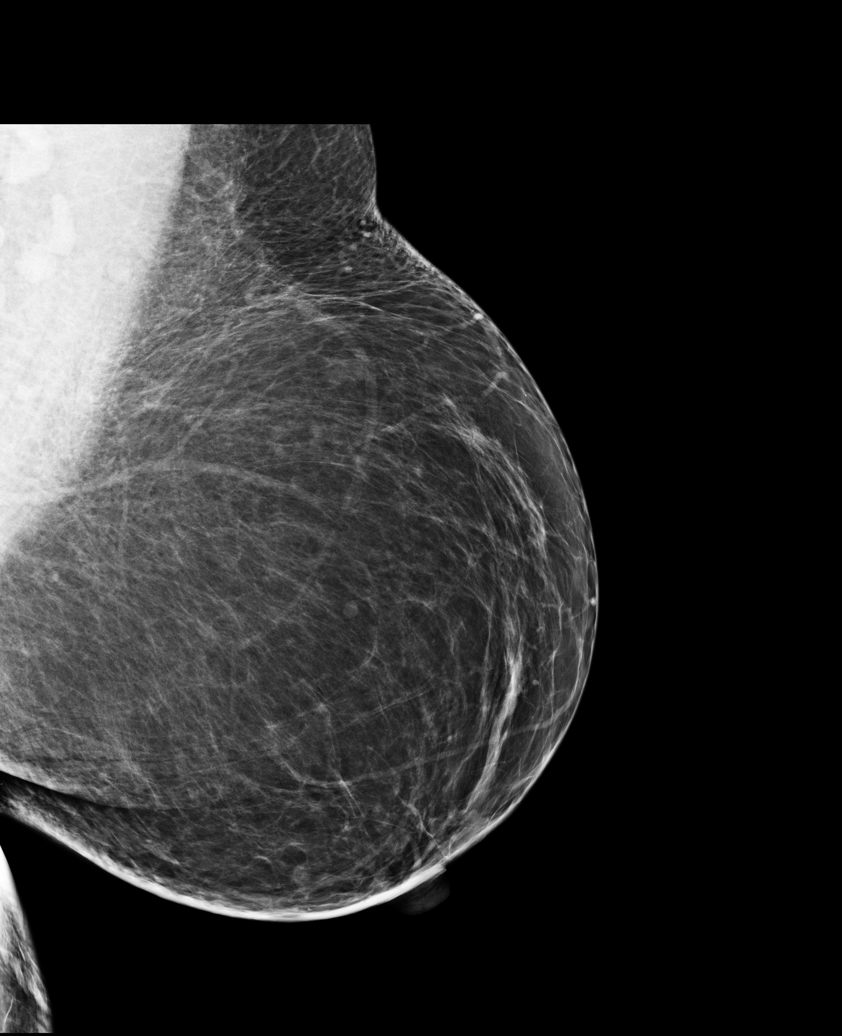

[R CC]
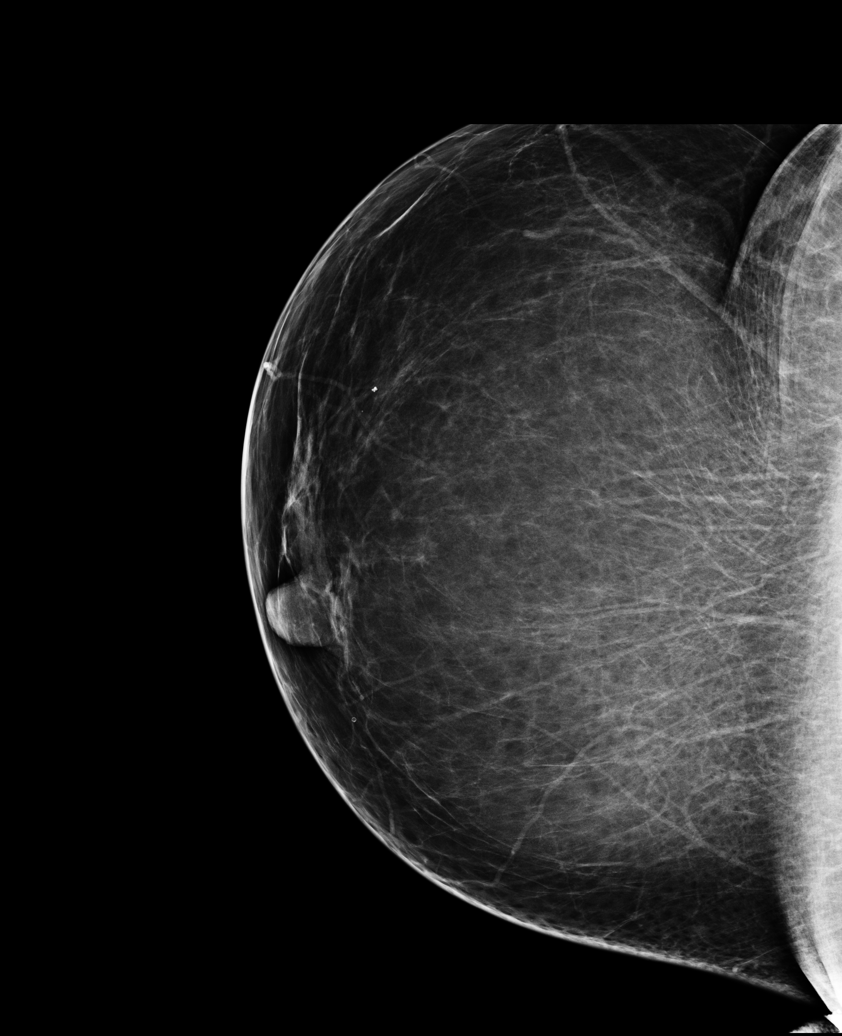

[R MLO]
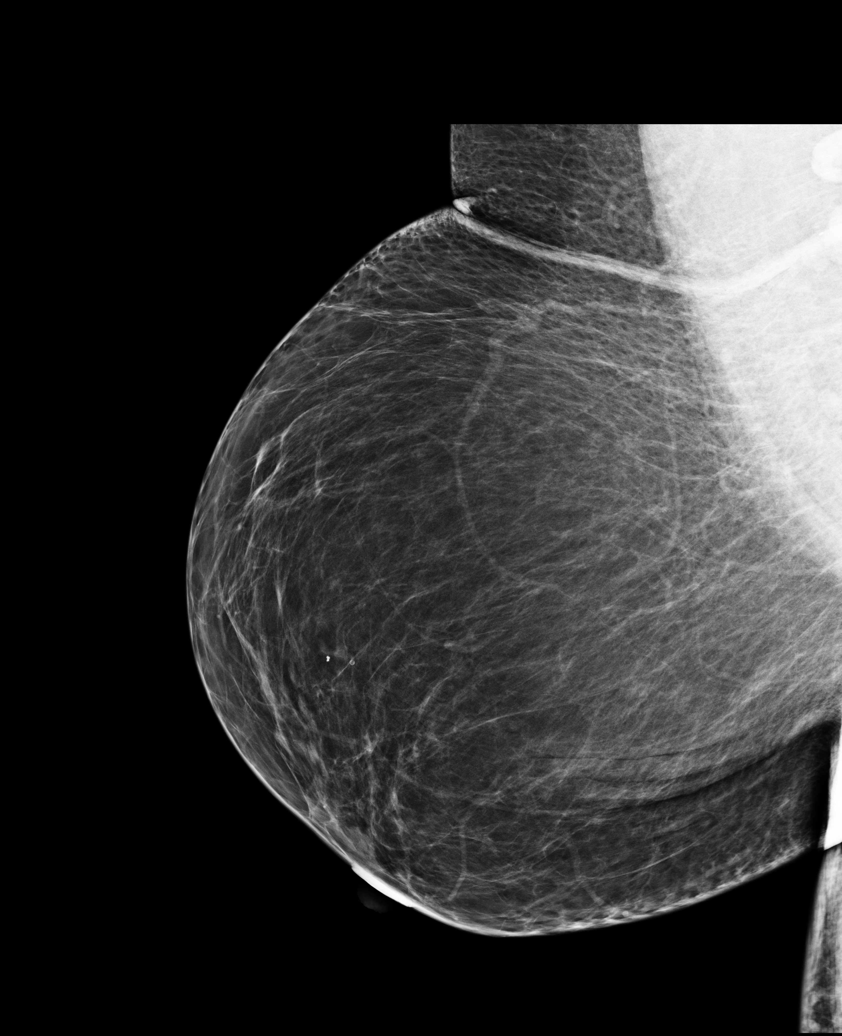

[L CC (2 of 2)]
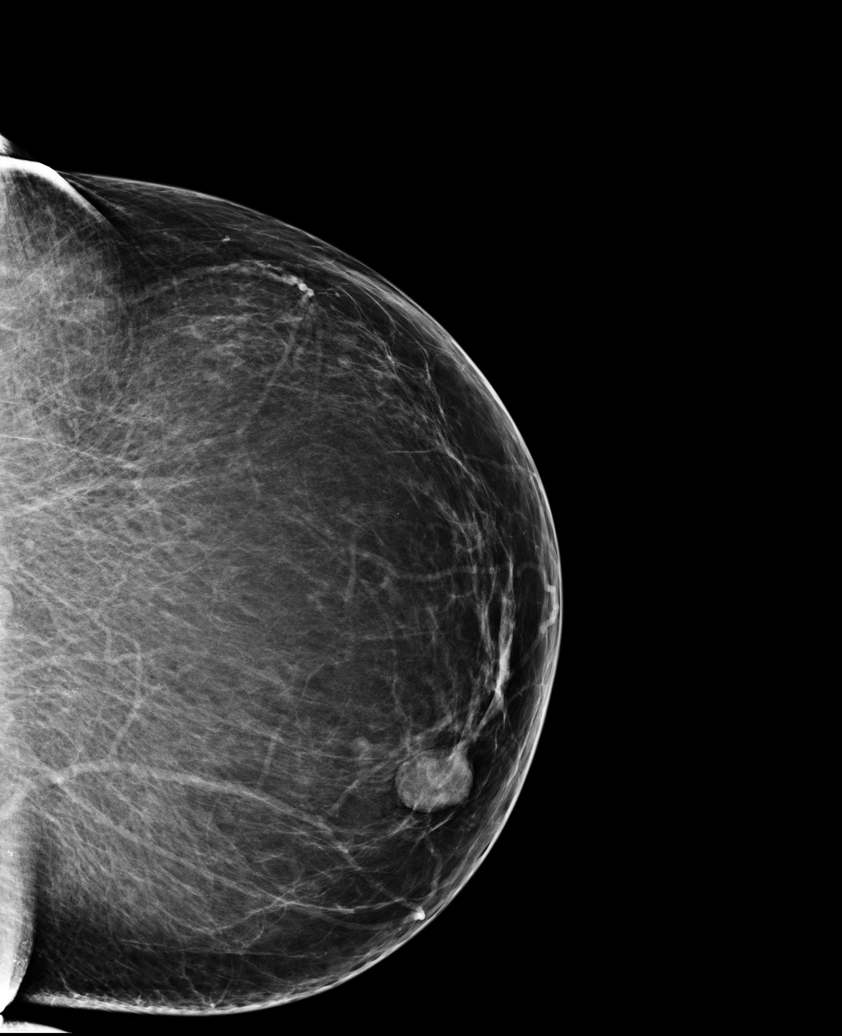

[5 of 5 positions shown; findings below may reference images not displayed]

The breast tissue is almost entirely fatty.  There is no dominant mass, architectural distortion or
calcification to suggest malignancy.

Images were processed with CAD.
IMPRESSION: No mammographic evidence of malignancy.  Suggest yearly screening mammography.

A result letter of this screening mammogram will be mailed directly to the patient.

ASSESSMENT: Negative - BI-RADS 1

Screening mammogram in 1 year.
,

## 2011-04-08 ENCOUNTER — Other Ambulatory Visit: Payer: Self-pay | Admitting: Family Medicine

## 2011-05-05 ENCOUNTER — Ambulatory Visit: Payer: Federal, State, Local not specified - PPO | Admitting: Family Medicine

## 2011-05-14 ENCOUNTER — Encounter: Payer: Self-pay | Admitting: Family Medicine

## 2011-05-14 ENCOUNTER — Ambulatory Visit (INDEPENDENT_AMBULATORY_CARE_PROVIDER_SITE_OTHER): Payer: Federal, State, Local not specified - PPO | Admitting: Family Medicine

## 2011-05-14 VITALS — BP 120/72 | HR 75 | Resp 18 | Ht 62.0 in | Wt 214.1 lb

## 2011-05-14 DIAGNOSIS — I10 Essential (primary) hypertension: Secondary | ICD-10-CM

## 2011-05-14 DIAGNOSIS — R5381 Other malaise: Secondary | ICD-10-CM

## 2011-05-14 DIAGNOSIS — E669 Obesity, unspecified: Secondary | ICD-10-CM

## 2011-05-14 DIAGNOSIS — R5383 Other fatigue: Secondary | ICD-10-CM

## 2011-05-14 DIAGNOSIS — E785 Hyperlipidemia, unspecified: Secondary | ICD-10-CM

## 2011-05-14 DIAGNOSIS — E119 Type 2 diabetes mellitus without complications: Secondary | ICD-10-CM

## 2011-05-14 MED ORDER — RAMIPRIL 2.5 MG PO CAPS: 2.5000 mg | ORAL_CAPSULE | Freq: Every day | ORAL | Status: DC

## 2011-05-14 NOTE — Progress Notes (Signed)
  Subjective:    Patient ID: Pamela Stein, female    DOB: 1955/04/15, 57 y.o.   MRN: [id number]  HPI The PT is here for follow up and re-evaluation of chronic medical conditions, medication management and review of any available recent lab and radiology data.  Preventive health is updated, specifically  Cancer screening and Immunization.   Questions or concerns regarding consultations or procedures which the PT has had in the interim are  addressed. The PT denies any adverse reactions to current medications since the last visit.  There are no new concerns.  There are no specific complaints  Fasting blood sugars are between 90 to 110 generally     Review of Systems See HPI Denies recent fever or chills. Denies sinus pressure, nasal congestion, ear pain or sore throat. Denies chest congestion, productive cough or wheezing. Denies chest pains, palpitations and leg swelling Denies abdominal pain, nausea, vomiting,diarrhea or constipation.   Denies dysuria, frequency, hesitancy or incontinence. Denies joint pain, swelling and limitation in mobility. Denies headaches, seizures, numbness, or tingling. Denies depression, anxiety or insomnia. Denies skin break down or rash.        Objective:   Physical Exam Patient alert and oriented and in no cardiopulmonary distress.  HEENT: No facial asymmetry, EOMI, no sinus tenderness,  oropharynx pink and moist.  Neck supple no adenopathy.  Chest: Clear to auscultation bilaterally.  CVS: S1, S2 no murmurs, no S3.  ABD: Soft non tender. Bowel sounds normal.  Ext: No edema  MS: Adequate ROM spine, shoulders, hips and knees.  Skin: Intact, no ulcerations or rash noted.  Psych: Good eye contact, normal affect. Memory intact not anxious or depressed appearing.  CNS: CN 2-12 intact, power, tone and sensation normal throughout.\ Diabetic Foot Check:  Appearance - no lesions, ulcers or calluses Skin - no unusual pallor or  redness Sensation - grossly intact to light touch Monofilament testing -  Right - Great toe, medial, central, lateral ball and posterior foot diminished Left - Great toe, medial, central, lateral ball and posterior foot diminished Pulses Left - Dorsalis Pedis and Posterior Tibia normal Right - Dorsalis Pedis and Posterior Tibia normal       Assessment & Plan:

## 2011-05-14 NOTE — Patient Instructions (Addendum)
F/u in 4 month  Please call if you need me before  CONGRATULATIONS on weight loss, keep it up.  Please commit to 30 minutes of exercise at least 5 days per week   New additional medication for kidney protection.  Fasting lipid, cmp and EGFR this week please  CBC, tSH, HBA1C and chem 7 non fasting in 4 months and microalb

## 2011-05-15 MED ORDER — METFORMIN HCL 1000 MG PO TABS: 500.0000 mg | ORAL_TABLET | Freq: Two times a day (BID) | ORAL | Status: DC

## 2011-05-16 LAB — COMPLETE METABOLIC PANEL WITH GFR
ALT: 14 U/L (ref 0–35)
Albumin: 4.2 g/dL (ref 3.5–5.2)
CO2: 28 mEq/L (ref 19–32)
Chloride: 101 mEq/L (ref 96–112)
GFR, Est African American: 56 mL/min — ABNORMAL LOW
Potassium: 4.3 mEq/L (ref 3.5–5.3)
Sodium: 138 mEq/L (ref 135–145)
Total Bilirubin: 0.4 mg/dL (ref 0.3–1.2)
Total Protein: 7.6 g/dL (ref 6.0–8.3)

## 2011-05-16 LAB — LIPID PANEL: LDL Cholesterol: 122 mg/dL — ABNORMAL HIGH (ref 0–99)

## 2011-05-18 NOTE — Assessment & Plan Note (Signed)
Hyperlipidemia:Low fat diet discussed and encouraged.  Improved though still not at goal, no med change at this time

## 2011-05-18 NOTE — Assessment & Plan Note (Signed)
Controlled, no change in medication  

## 2011-05-18 NOTE — Assessment & Plan Note (Signed)
Improved. Pt applauded on succesful weight loss through lifestyle change, and encouraged to continue same. Weight loss goal set for the next several months.  

## 2011-05-19 NOTE — Progress Notes (Signed)
Addended by: Abner Greenspan on: 05/19/2011 01:07 PM   Modules accepted: Orders

## 2011-08-11 ENCOUNTER — Ambulatory Visit: Payer: Federal, State, Local not specified - PPO | Admitting: Family Medicine

## 2011-09-10 ENCOUNTER — Telehealth: Payer: Self-pay | Admitting: Family Medicine

## 2011-09-10 ENCOUNTER — Ambulatory Visit (INDEPENDENT_AMBULATORY_CARE_PROVIDER_SITE_OTHER): Payer: Federal, State, Local not specified - PPO | Admitting: Family Medicine

## 2011-09-10 ENCOUNTER — Encounter: Payer: Self-pay | Admitting: Family Medicine

## 2011-09-10 VITALS — BP 124/80 | HR 73 | Resp 16 | Ht 62.0 in | Wt 209.0 lb

## 2011-09-10 DIAGNOSIS — E669 Obesity, unspecified: Secondary | ICD-10-CM

## 2011-09-10 DIAGNOSIS — J42 Unspecified chronic bronchitis: Secondary | ICD-10-CM

## 2011-09-10 DIAGNOSIS — R5381 Other malaise: Secondary | ICD-10-CM

## 2011-09-10 DIAGNOSIS — M949 Disorder of cartilage, unspecified: Secondary | ICD-10-CM

## 2011-09-10 DIAGNOSIS — E785 Hyperlipidemia, unspecified: Secondary | ICD-10-CM

## 2011-09-10 DIAGNOSIS — I10 Essential (primary) hypertension: Secondary | ICD-10-CM

## 2011-09-10 DIAGNOSIS — M899 Disorder of bone, unspecified: Secondary | ICD-10-CM

## 2011-09-10 DIAGNOSIS — E119 Type 2 diabetes mellitus without complications: Secondary | ICD-10-CM

## 2011-09-10 MED ORDER — BENZONATATE 100 MG PO CAPS: 100.0000 mg | ORAL_CAPSULE | Freq: Four times a day (QID) | ORAL | Status: DC | PRN

## 2011-09-10 MED ORDER — SIMVASTATIN 40 MG PO TABS: ORAL_TABLET | ORAL | Status: DC

## 2011-09-10 MED ORDER — ALBUTEROL SULFATE (2.5 MG/3ML) 0.083% IN NEBU
2.5000 mg | INHALATION_SOLUTION | Freq: Once | RESPIRATORY_TRACT | Status: AC
  Administered 2011-09-10: 2.5 mg via RESPIRATORY_TRACT

## 2011-09-10 MED ORDER — CEFTRIAXONE SODIUM 500 MG IJ SOLR
500.0000 mg | Freq: Once | INTRAMUSCULAR | Status: AC
  Administered 2011-09-10: 500 mg via INTRAMUSCULAR

## 2011-09-10 MED ORDER — TRIAMTERENE-HCTZ 37.5-25 MG PO TABS: ORAL_TABLET | ORAL | Status: DC

## 2011-09-10 MED ORDER — SULFAMETHOXAZOLE-TRIMETHOPRIM 800-160 MG PO TABS: 1.0000 | ORAL_TABLET | Freq: Two times a day (BID) | ORAL | Status: AC

## 2011-09-10 MED ORDER — HYDROCOD POLST-CHLORPHEN POLST 10-8 MG/5ML PO LQCR: 5.0000 mL | Freq: Two times a day (BID) | ORAL | Status: DC | PRN

## 2011-09-10 MED ORDER — IPRATROPIUM BROMIDE 0.02 % IN SOLN
0.5000 mg | Freq: Once | RESPIRATORY_TRACT | Status: AC
  Administered 2011-09-10: 0.5 mg via RESPIRATORY_TRACT

## 2011-09-10 MED ORDER — METHYLPREDNISOLONE ACETATE 80 MG/ML IJ SUSP
80.0000 mg | Freq: Once | INTRAMUSCULAR | Status: AC
  Administered 2011-09-10: 80 mg via INTRAMUSCULAR

## 2011-09-10 NOTE — Patient Instructions (Addendum)
F/U in 4 month  PLEASE call if you need me before  Labs today.Microalb today  Fasting lipid, cmp and EGFR, HBA1C in 4 months  You will get breathing treatment, depo medrol 80mg  and Rocephin 500mg  iM in office today for chronic bronchitis.  Medication is also sent to your pharmacy.  Refills will be sent   I hope you feel better soon, call if not.

## 2011-09-11 LAB — MICROALBUMIN / CREATININE URINE RATIO: Microalb, Ur: 0.58 mg/dL (ref 0.00–1.89)

## 2011-09-12 LAB — BASIC METABOLIC PANEL
Calcium: 10.8 mg/dL — ABNORMAL HIGH (ref 8.4–10.5)
Creat: 1.38 mg/dL — ABNORMAL HIGH (ref 0.50–1.10)
Glucose, Bld: 90 mg/dL (ref 70–99)
Sodium: 137 mEq/L (ref 135–145)

## 2011-09-12 LAB — TSH: TSH: 2.35 u[IU]/mL (ref 0.350–4.500)

## 2011-09-13 LAB — CBC WITH DIFFERENTIAL/PLATELET
Basophils Relative: 0 % (ref 0–1)
Eosinophils Absolute: 0.2 10*3/uL (ref 0.0–0.7)
Hemoglobin: 12.1 g/dL (ref 12.0–15.0)
MCH: 21.3 pg — ABNORMAL LOW (ref 26.0–34.0)
MCHC: 33.2 g/dL (ref 30.0–36.0)
Monocytes Absolute: 0.8 10*3/uL (ref 0.1–1.0)
Monocytes Relative: 8 % (ref 3–12)
Neutrophils Relative %: 53 % (ref 43–77)
RDW: 16.7 % — ABNORMAL HIGH (ref 11.5–15.5)

## 2011-09-13 NOTE — Progress Notes (Signed)
  Subjective:    Patient ID: Pamela Stein, female    DOB: 04-01-1955, 56 y.o.   MRN: [id number]  HPI The PT is here for follow up and re-evaluation of chronic medical conditions, medication management and review of any available recent lab and radiology data.  Preventive health is updated, specifically  Cancer screening and Immunization.   Questions or concerns regarding consultations or procedures which the PT has had in the interim are  addressed. The PT denies any adverse reactions to current medications since the last visit.  3 week h/o cough and chest congestion, disturbing her sleep , and productive of yellow sputum, denies sinus pressure or post nasal drainage     Review of Systems See HPI Denies recent fever or chills. Denies sinus pressure, nasal congestion, ear pain or sore throat.  Denies chest pains, palpitations and leg swelling Denies abdominal pain, nausea, vomiting,diarrhea or constipation.   Denies dysuria, frequency, hesitancy or incontinence. Denies joint pain, swelling and limitation in mobility. Denies headaches, seizures, numbness, or tingling. Denies depression or  anxiety . Increased difficulty with sleep due to excessive cough amd also recent change to night shift Denies skin break down or rash.        Objective:   Physical Exam Patient alert and oriented and in no cardiopulmonary distress.  HEENT: No facial asymmetry, EOMI, no sinus tenderness,  oropharynx pink and moist.  Neck supple no adenopathy.  Chest: decreased air entry, scattered crackles, few wheezes  CVS: S1, S2 no murmurs, no S3.  ABD: Soft non tender. Bowel sounds normal.  Ext: No edema  MS: Adequate ROM spine, shoulders, hips and knees.  Skin: Intact, no ulcerations or rash noted.  Psych: Good eye contact, normal affect. Memory intact not anxious or depressed appearing.  CNS: CN 2-12 intact, power, tone and sensation normal throughout.  Diabetic Foot Check:  Appearance -  no lesions, ulcers or calluses Skin - no unusual pallor or redness Sensation - grossly intact to light touch Monofilament testing -  Right - Great toe, medial, central, lateral ball and posterior foot  decreased Left - Great toe, medial, central, lateral ball and posterior foot decreased Pulses Left - Dorsalis Pedis and Posterior Tibia normal Right - Dorsalis Pedis and Posterior Tibia normal       Assessment & Plan:

## 2011-09-13 NOTE — Assessment & Plan Note (Signed)
3 week h/o worsening symptoms. Neb treatment administered, Antibiotic administered as well as steroid injection

## 2011-09-13 NOTE — Assessment & Plan Note (Signed)
Controlled, no change in medication  

## 2011-09-13 NOTE — Assessment & Plan Note (Signed)
Improved. Pt applauded on succesful weight loss through lifestyle change, and encouraged to continue same. Weight loss goal set for the next several months.  

## 2011-09-13 NOTE — Assessment & Plan Note (Signed)
Uncontrolled,  Hyperlipidemia:Low fat diet discussed and encouraged.  No med changes at this time

## 2011-09-14 ENCOUNTER — Other Ambulatory Visit: Payer: Self-pay

## 2011-09-14 ENCOUNTER — Other Ambulatory Visit: Payer: Self-pay | Admitting: Family Medicine

## 2011-09-14 LAB — VITAMIN D 25 HYDROXY (VIT D DEFICIENCY, FRACTURES): Vit D, 25-Hydroxy: 25 ng/mL — ABNORMAL LOW (ref 30–89)

## 2011-09-14 NOTE — Telephone Encounter (Signed)
States it was already taken care of

## 2011-09-15 LAB — PARATHYROID HORMONE, INTACT (NO CA): PTH: 137.3 pg/mL — ABNORMAL HIGH (ref 14.0–72.0)

## 2011-09-16 ENCOUNTER — Other Ambulatory Visit: Payer: Self-pay | Admitting: Family Medicine

## 2011-09-16 DIAGNOSIS — R7989 Other specified abnormal findings of blood chemistry: Secondary | ICD-10-CM

## 2011-09-17 ENCOUNTER — Telehealth: Payer: Self-pay | Admitting: Family Medicine

## 2011-09-18 NOTE — Telephone Encounter (Signed)
Called and left message for pt to return call.  

## 2011-09-28 ENCOUNTER — Ambulatory Visit: Payer: Federal, State, Local not specified - PPO | Admitting: Family Medicine

## 2011-09-29 ENCOUNTER — Other Ambulatory Visit: Payer: Self-pay | Admitting: Adult Health

## 2011-09-29 ENCOUNTER — Other Ambulatory Visit (HOSPITAL_COMMUNITY)
Admission: RE | Admit: 2011-09-29 | Discharge: 2011-09-29 | Disposition: A | Discharge status: Home / Self Care | Payer: Federal, State, Local not specified - PPO | Source: Ambulatory Visit | Attending: Obstetrics and Gynecology | Admitting: Obstetrics and Gynecology

## 2011-09-29 DIAGNOSIS — Z01419 Encounter for gynecological examination (general) (routine) without abnormal findings: Secondary | ICD-10-CM | POA: Insufficient documentation

## 2011-09-29 DIAGNOSIS — Z1151 Encounter for screening for human papillomavirus (HPV): Secondary | ICD-10-CM | POA: Insufficient documentation

## 2011-10-27 ENCOUNTER — Other Ambulatory Visit: Payer: Self-pay | Admitting: Family Medicine

## 2011-11-02 ENCOUNTER — Encounter: Payer: Self-pay | Admitting: Family Medicine

## 2011-11-02 ENCOUNTER — Other Ambulatory Visit: Payer: Self-pay

## 2011-11-02 ENCOUNTER — Other Ambulatory Visit: Payer: Self-pay | Admitting: Family Medicine

## 2011-11-02 ENCOUNTER — Ambulatory Visit (HOSPITAL_COMMUNITY)
Admission: RE | Admit: 2011-11-02 | Discharge: 2011-11-02 | Disposition: A | Discharge status: Home / Self Care | Payer: Federal, State, Local not specified - PPO | Source: Ambulatory Visit | Attending: Family Medicine | Admitting: Family Medicine

## 2011-11-02 ENCOUNTER — Ambulatory Visit (INDEPENDENT_AMBULATORY_CARE_PROVIDER_SITE_OTHER): Payer: Federal, State, Local not specified - PPO | Admitting: Family Medicine

## 2011-11-02 VITALS — BP 138/84 | HR 73 | Resp 16 | Ht 62.0 in | Wt 214.0 lb

## 2011-11-02 DIAGNOSIS — R5381 Other malaise: Secondary | ICD-10-CM

## 2011-11-02 DIAGNOSIS — E785 Hyperlipidemia, unspecified: Secondary | ICD-10-CM

## 2011-11-02 DIAGNOSIS — E669 Obesity, unspecified: Secondary | ICD-10-CM

## 2011-11-02 DIAGNOSIS — I10 Essential (primary) hypertension: Secondary | ICD-10-CM

## 2011-11-02 DIAGNOSIS — R937 Abnormal findings on diagnostic imaging of other parts of musculoskeletal system: Secondary | ICD-10-CM | POA: Insufficient documentation

## 2011-11-02 DIAGNOSIS — M79609 Pain in unspecified limb: Secondary | ICD-10-CM | POA: Insufficient documentation

## 2011-11-02 DIAGNOSIS — E119 Type 2 diabetes mellitus without complications: Secondary | ICD-10-CM

## 2011-11-02 DIAGNOSIS — M7989 Other specified soft tissue disorders: Secondary | ICD-10-CM

## 2011-11-02 LAB — BASIC METABOLIC PANEL
BUN: 15 mg/dL (ref 6–23)
CO2: 27 mEq/L (ref 19–32)
Calcium: 10.3 mg/dL (ref 8.4–10.5)
Glucose, Bld: 92 mg/dL (ref 70–99)

## 2011-11-02 LAB — URIC ACID: Uric Acid, Serum: 6.8 mg/dL (ref 2.4–7.0)

## 2011-11-02 LAB — CBC WITH DIFFERENTIAL/PLATELET
Basophils Relative: 1 % (ref 0–1)
Eosinophils Absolute: 0.2 10*3/uL (ref 0.0–0.7)
HCT: 33.9 % — ABNORMAL LOW (ref 36.0–46.0)
Hemoglobin: 11.4 g/dL — ABNORMAL LOW (ref 12.0–15.0)
Lymphs Abs: 3 10*3/uL (ref 0.7–4.0)
MCH: 22 pg — ABNORMAL LOW (ref 26.0–34.0)
MCHC: 33.6 g/dL (ref 30.0–36.0)
MCV: 65.4 fL — ABNORMAL LOW (ref 78.0–100.0)
Monocytes Absolute: 0.9 10*3/uL (ref 0.1–1.0)
Monocytes Relative: 9 % (ref 3–12)

## 2011-11-02 IMAGING — CR DG TOE GREAT 2+V*L*
3 series · 3 of 3 positions shown · non-contrast
Comparison: None.

CLINICAL DATA: Pain and swelling

LEFT GREAT TOE

[view not recorded (1 of 3)]
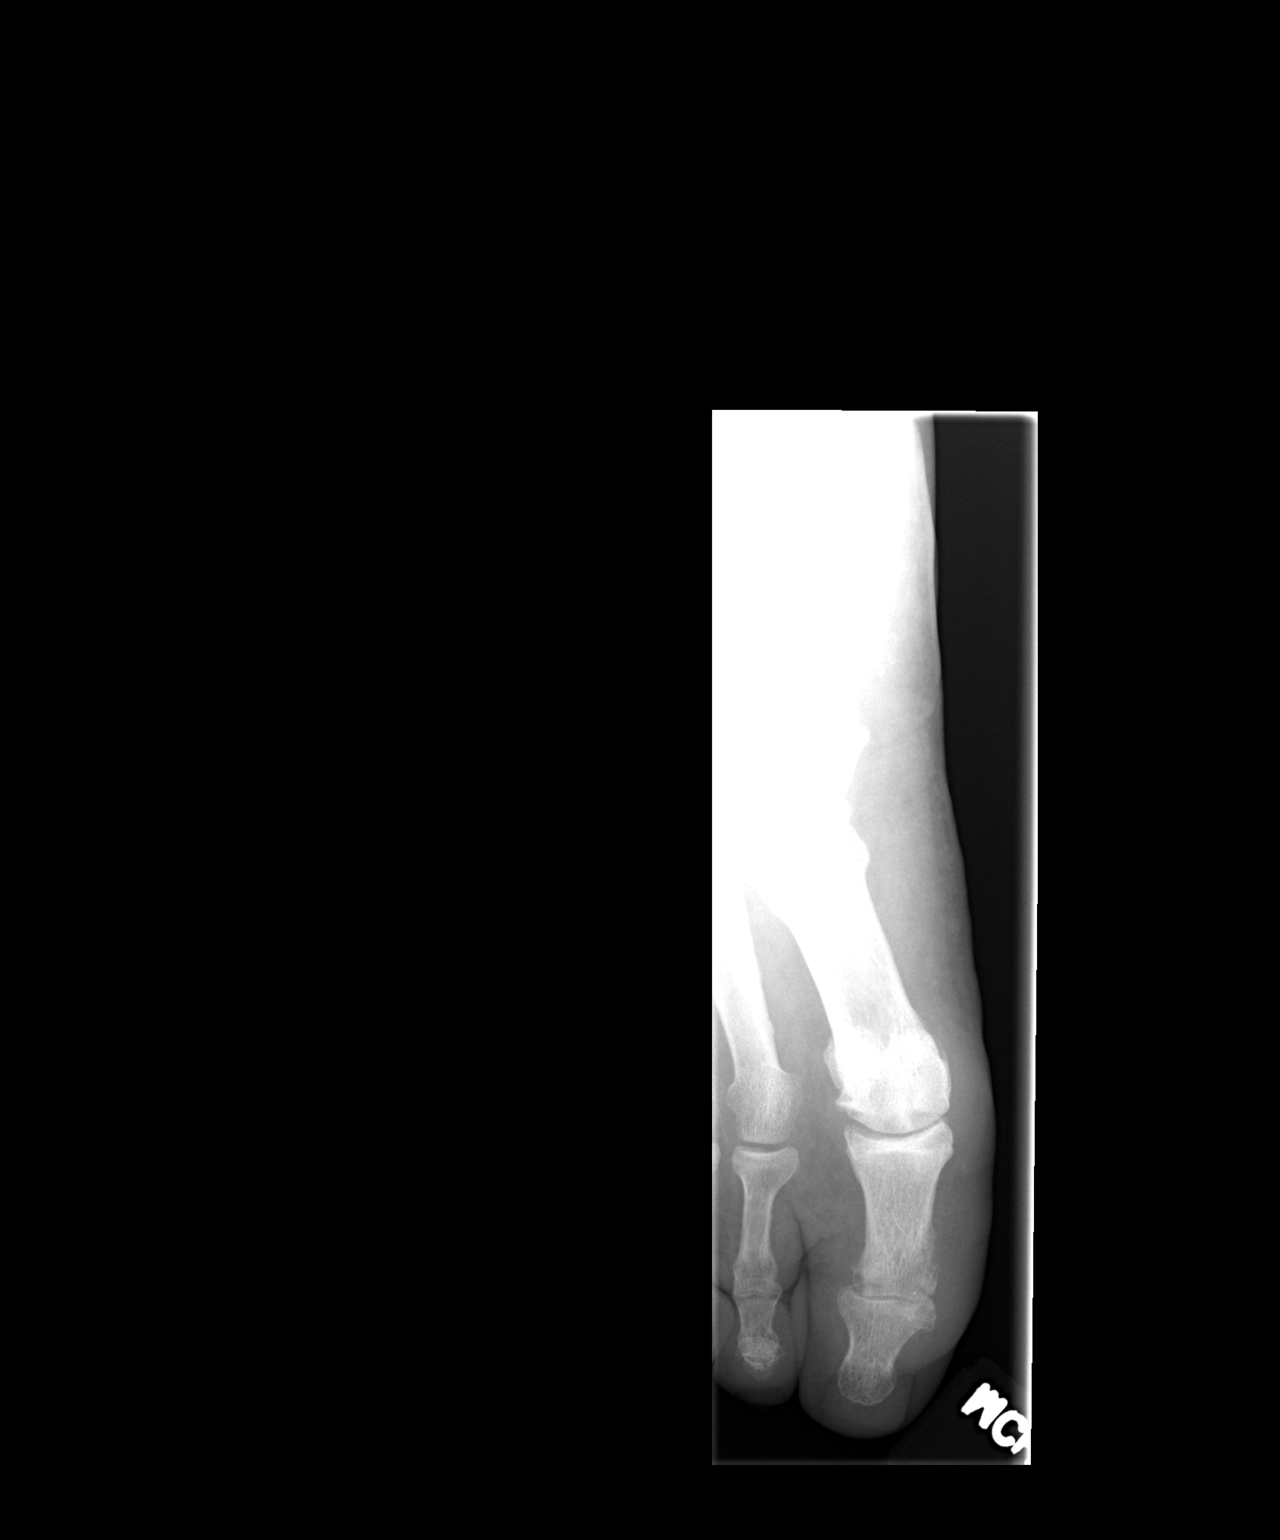

[view not recorded (2 of 3)]
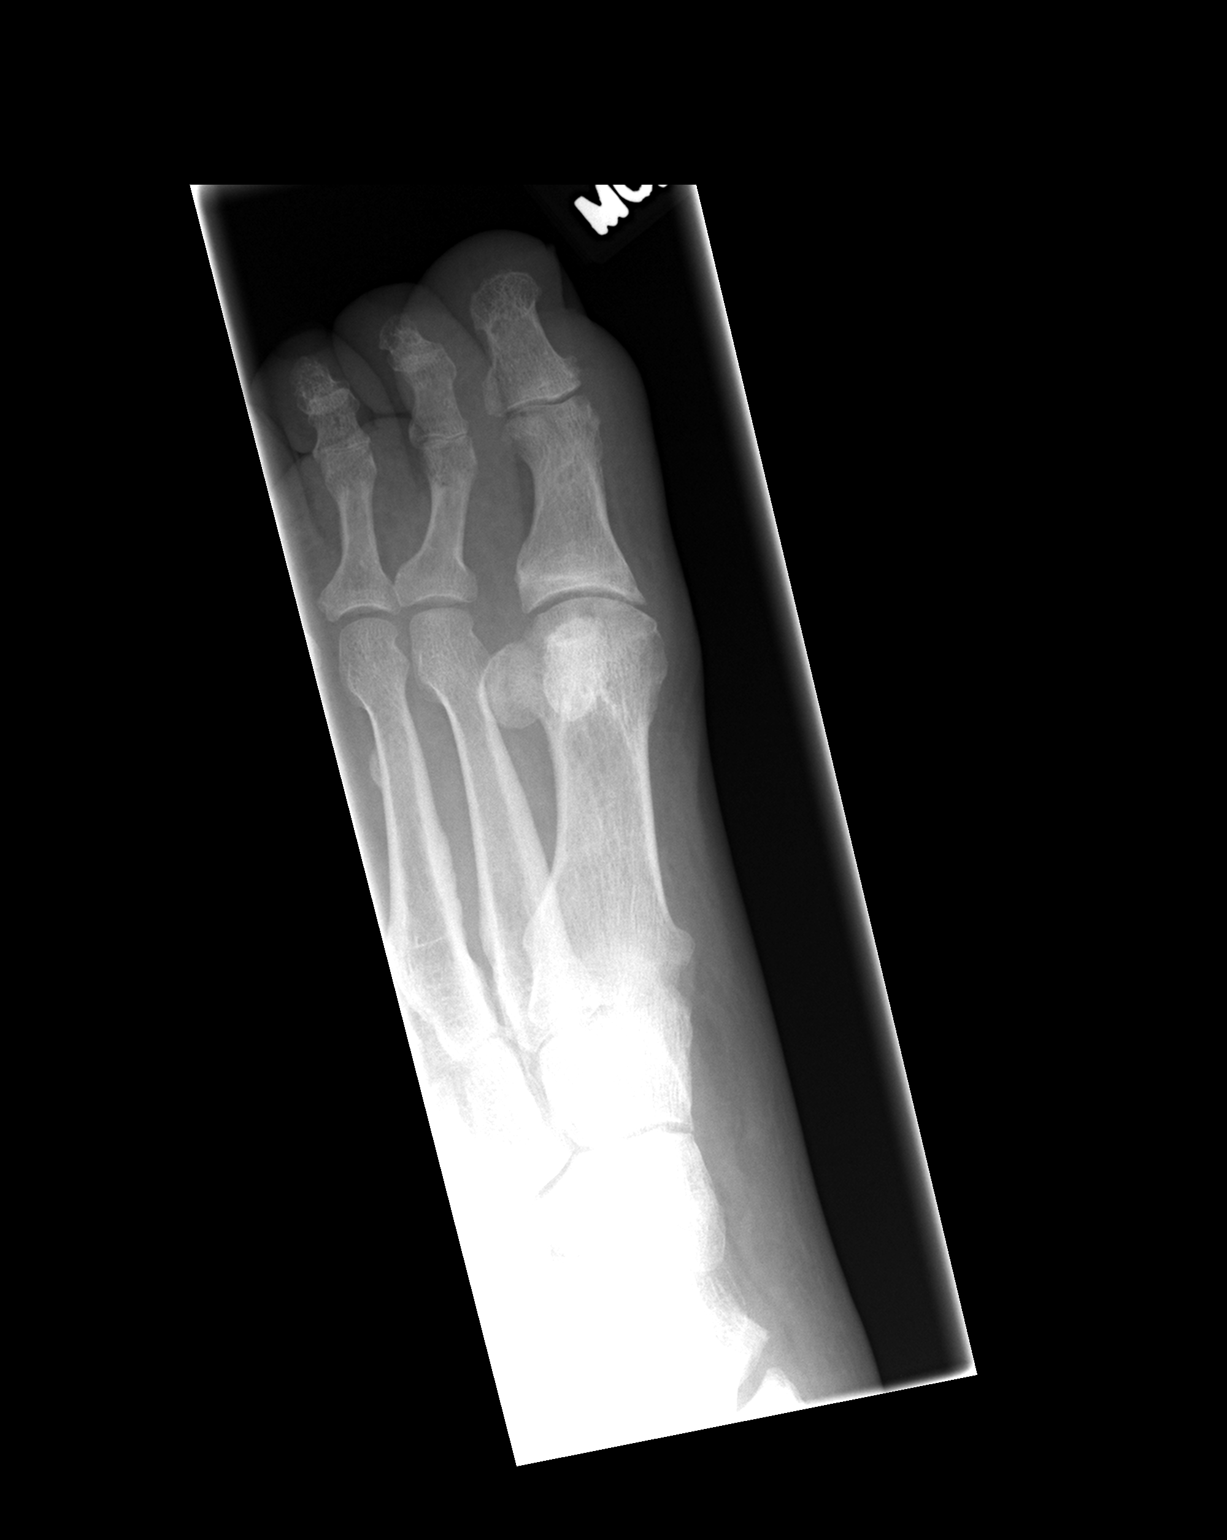

[view not recorded (3 of 3)]
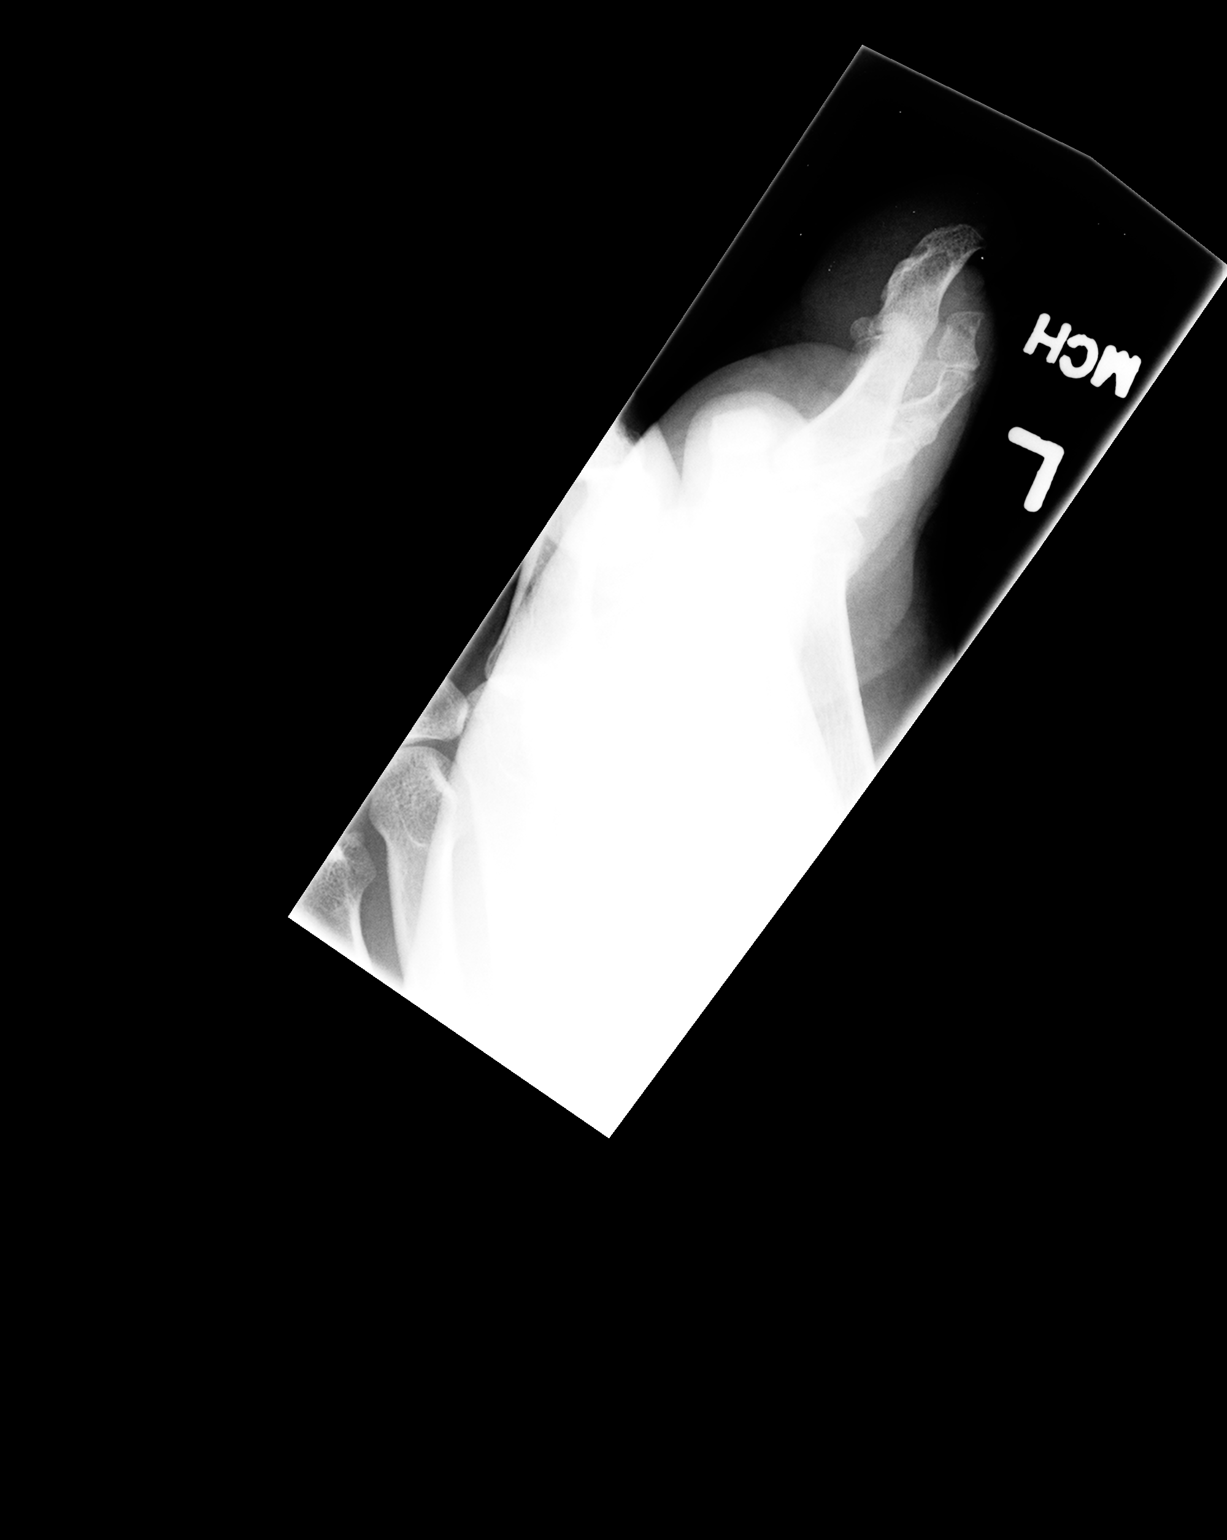

[3 of 3 positions shown; findings below may reference images not displayed]

FINDINGS: No fracture or dislocation is seen.

Degenerative changes at the first IP and MTP joints.

Possible marginal erosions along the medial and lateral aspects of
the IP joint.

Cortical irregularity along the medial aspect of the proximal
phalanx.

Associated soft tissue swelling.
IMPRESSION: Degenerative changes with marginal erosions at the IP joint,
possibly reflecting inflammatory arthropathy.

Additional cortical irregularity along the medial aspect of the
proximal phalanx, with overlying soft tissue swelling.  Correlate
clinically to exclude osteomyelitis/septic joint.

These results will be called to the ordering clinician or
representative by the Radiologist Assistant, and communication
documented in the PACS Dashboard.

## 2011-11-02 MED ORDER — ESOMEPRAZOLE MAGNESIUM 20 MG PO CPDR: 20.0000 mg | DELAYED_RELEASE_CAPSULE | Freq: Every day | ORAL | Status: DC

## 2011-11-02 MED ORDER — TRIAMTERENE-HCTZ 37.5-25 MG PO TABS: ORAL_TABLET | ORAL | Status: DC

## 2011-11-02 MED ORDER — PREDNISONE (PAK) 5 MG PO TABS: 5.0000 mg | ORAL_TABLET | ORAL | Status: DC

## 2011-11-02 MED ORDER — INDOMETHACIN 25 MG PO CAPS: ORAL_CAPSULE | ORAL | Status: DC

## 2011-11-02 NOTE — Patient Instructions (Addendum)
F/u as before.  Please call in 2 days if no better.  Toradol 60mg  and depo medrol 80mg  Im in office today, and idocin and prednisone to be started today also  Nexium for 7 days to protect stomach  Xray of toe today, and cbc, chem 7 and uric acid level today also

## 2011-11-02 NOTE — Assessment & Plan Note (Signed)
Hyperlipidemia:Low fat diet discussed and encouraged.  Elevated LDL, no med change, dietary management only

## 2011-11-02 NOTE — Assessment & Plan Note (Signed)
Sub optimal control, no change in medication at this time  DASH diet and commitment to daily physical activity for a minimum of 30 minutes discussed and encouraged, as a part of hypertension management. The importance of attaining a healthy weight is also discussed.

## 2011-11-02 NOTE — Assessment & Plan Note (Signed)
Acute red swollen great toe, likely gout. No sign of bacterial infection. Anti inflammatories, lab and xray today, call in 2 days if no better

## 2011-11-02 NOTE — Progress Notes (Signed)
  Subjective:    Patient ID: Pamela Stein, female    DOB: December 20, 1955, 56 y.o.   MRN: [id number]  HPI Last Wednesday pt developed acute redness, and swelling of left great toe, no known trauma, first episode. Has had  a change in bP medication, off ACE and no longer coughing Upcoming re eval by endo for elevated PTH Blood sugars fasting are seldom over 110, and eye exam is up to date   Review of Systems See HPI Denies recent fever or chills. Denies sinus pressure, nasal congestion, ear pain or sore throat. Denies chest congestion, productive cough or wheezing. Denies chest pains, palpitations and leg swelling Denies abdominal pain, nausea, vomiting,diarrhea or constipation.   Denies dysuria, frequency, hesitancy or incontinence. Denies headaches, seizures, numbness, or tingling. Denies depression, anxiety or insomnia. Denies skin break down or rash.        Objective:   Physical Exam  Patient alert and oriented and in no cardiopulmonary distress.  HEENT: No facial asymmetry, EOMI, no sinus tenderness,  oropharynx pink and moist.  Neck supple no adenopathy.  Chest: Clear to auscultation bilaterally.  CVS: S1, S2 no murmurs, no S3.  ABD: Soft non tender. Bowel sounds normal.  Ext: No edema  MS: Adequate ROM spine, shoulders, hips and knees.swollen red left great toe, no purulent drainage or excessive warmth  Skin: Intact, no ulcerations or rash noted.  Psych: Good eye contact, normal affect. Memory intact not anxious or depressed appearing.  CNS: CN 2-12 intact, power, tone and sensation normal throughout.       Assessment & Plan:

## 2011-11-02 NOTE — Assessment & Plan Note (Signed)
Controlled, no change in medication Patient advised to reduce carb and sweets, commit to regular physical activity, take meds as prescribed, test blood as directed, and attempt to lose weight, to improve blood sugar control.  

## 2011-11-03 ENCOUNTER — Telehealth: Payer: Self-pay | Admitting: Family Medicine

## 2011-11-03 MED ORDER — METHYLPREDNISOLONE ACETATE 80 MG/ML IJ SUSP
80.0000 mg | Freq: Once | INTRAMUSCULAR | Status: AC
  Administered 2011-11-03: 80 mg via INTRAMUSCULAR

## 2011-11-03 MED ORDER — KETOROLAC TROMETHAMINE 60 MG/2ML IM SOLN
60.0000 mg | Freq: Once | INTRAMUSCULAR | Status: AC
  Administered 2011-11-03: 60 mg via INTRAMUSCULAR

## 2011-11-03 NOTE — Addendum Note (Signed)
Addended by: Abner Greenspan on: 11/03/2011 09:07 AM   Modules accepted: Orders

## 2011-11-03 NOTE — Telephone Encounter (Signed)
Spoke with pt. States toe is much improved, so i will continue current course. She is to "stop in" not for formal visit early Thursday morning so I can see the toe, just put her to the back

## 2011-11-04 LAB — ANEMIA PANEL
Ferritin: 63 ng/mL (ref 10–291)
Folate: 16.7 ng/mL
RBC.: 5.22 MIL/uL — ABNORMAL HIGH (ref 3.87–5.11)
Retic Ct Pct: 1.5 % (ref 0.4–2.3)
Vitamin B-12: 364 pg/mL (ref 211–911)

## 2011-11-11 ENCOUNTER — Other Ambulatory Visit: Payer: Self-pay

## 2011-11-11 MED ORDER — FERROUS SULFATE 325 (65 FE) MG PO TABS: 325.0000 mg | ORAL_TABLET | Freq: Every day | ORAL | Status: DC

## 2011-11-29 ENCOUNTER — Other Ambulatory Visit: Payer: Self-pay | Admitting: Family Medicine

## 2011-12-29 ENCOUNTER — Other Ambulatory Visit: Payer: Self-pay | Admitting: Family Medicine

## 2011-12-29 ENCOUNTER — Telehealth: Payer: Self-pay | Admitting: Family Medicine

## 2011-12-29 DIAGNOSIS — E119 Type 2 diabetes mellitus without complications: Secondary | ICD-10-CM

## 2011-12-29 MED ORDER — ALPRAZOLAM 0.25 MG PO TABS: ORAL_TABLET | ORAL | Status: DC

## 2011-12-29 NOTE — Telephone Encounter (Signed)
Pt aware.

## 2011-12-29 NOTE — Telephone Encounter (Signed)
I entered 20 xanax , let her know I am sorry to hear of the anxiety, she needs to make an appt for early Dec with me her hBa1C is due at that time also, pls send rx after you speak with her

## 2011-12-29 NOTE — Telephone Encounter (Signed)
Pt has been under a lot of stress and really needs something for her nerves. Can something be sent in or does she need OV first?

## 2012-01-09 LAB — HEMOGLOBIN A1C: Mean Plasma Glucose: 137 mg/dL — ABNORMAL HIGH (ref <117)

## 2012-01-12 ENCOUNTER — Ambulatory Visit (INDEPENDENT_AMBULATORY_CARE_PROVIDER_SITE_OTHER): Payer: Federal, State, Local not specified - PPO | Admitting: Family Medicine

## 2012-01-12 ENCOUNTER — Encounter: Payer: Self-pay | Admitting: Family Medicine

## 2012-01-12 VITALS — BP 130/82 | HR 70 | Resp 15 | Ht 62.0 in | Wt 214.0 lb

## 2012-01-12 DIAGNOSIS — R059 Cough, unspecified: Secondary | ICD-10-CM

## 2012-01-12 DIAGNOSIS — E785 Hyperlipidemia, unspecified: Secondary | ICD-10-CM

## 2012-01-12 DIAGNOSIS — R058 Other specified cough: Secondary | ICD-10-CM | POA: Insufficient documentation

## 2012-01-12 DIAGNOSIS — Z23 Encounter for immunization: Secondary | ICD-10-CM

## 2012-01-12 DIAGNOSIS — E669 Obesity, unspecified: Secondary | ICD-10-CM

## 2012-01-12 DIAGNOSIS — B351 Tinea unguium: Secondary | ICD-10-CM

## 2012-01-12 DIAGNOSIS — E119 Type 2 diabetes mellitus without complications: Secondary | ICD-10-CM

## 2012-01-12 DIAGNOSIS — I10 Essential (primary) hypertension: Secondary | ICD-10-CM

## 2012-01-12 DIAGNOSIS — R05 Cough: Secondary | ICD-10-CM

## 2012-01-12 DIAGNOSIS — G4733 Obstructive sleep apnea (adult) (pediatric): Secondary | ICD-10-CM

## 2012-01-12 MED ORDER — ALBUTEROL SULFATE HFA 108 (90 BASE) MCG/ACT IN AERS: 2.0000 | INHALATION_SPRAY | Freq: Four times a day (QID) | RESPIRATORY_TRACT | Status: DC | PRN

## 2012-01-12 MED ORDER — IPRATROPIUM BROMIDE 0.02 % IN SOLN
0.5000 mg | Freq: Once | RESPIRATORY_TRACT | Status: AC
  Administered 2012-01-12: 0.5 mg via RESPIRATORY_TRACT

## 2012-01-12 MED ORDER — ALBUTEROL SULFATE (2.5 MG/3ML) 0.083% IN NEBU
2.5000 mg | INHALATION_SOLUTION | Freq: Once | RESPIRATORY_TRACT | Status: AC
  Administered 2012-01-12: 2.5 mg via RESPIRATORY_TRACT

## 2012-01-12 MED ORDER — TERBINAFINE HCL 250 MG PO TABS: 250.0000 mg | ORAL_TABLET | Freq: Every day | ORAL | Status: DC

## 2012-01-12 MED ORDER — PROMETHAZINE-DM 6.25-15 MG/5ML PO SYRP: ORAL_SOLUTION | ORAL | Status: DC

## 2012-01-12 MED ORDER — PRAVASTATIN SODIUM 40 MG PO TABS: 40.0000 mg | ORAL_TABLET | Freq: Every evening | ORAL | Status: DC

## 2012-01-12 NOTE — Assessment & Plan Note (Addendum)
Fasting lipids when next visit  Hyperlipidemia:Low fat diet discussed and encouraged.  Controlled when last checked

## 2012-01-12 NOTE — Assessment & Plan Note (Signed)
Increased allergic cough neb treatment administered in office

## 2012-01-12 NOTE — Assessment & Plan Note (Signed)
Pt to start medication orally for toenail infection

## 2012-01-12 NOTE — Assessment & Plan Note (Signed)
Deteriorated. Patient re-educated about  the importance of commitment to a  minimum of 150 minutes of exercise per week. The importance of healthy food choices with portion control discussed. Encouraged to start a food diary, count calories and to consider  joining a support group. Sample diet sheets offered. Goals set by the patient for the next several months.    

## 2012-01-12 NOTE — Patient Instructions (Addendum)
F/U in 4 month  You are getting a breathing treatment for allergic cough with wheezing, and an albuterol inhaler is prescribed for use at bedtime, then as needed. Cough suppressant syrup is also prescribed  Your sugar has improved, congrats. Blood pressure is good.  Stop simvastatin, when next due , start pravastatin in it's place for your cholesterol.  New medication prescribed for funga toenail infection. Use foot drying powder,between toes, like DrScholl's to keep skin dry and reduce fungal growth  Fasting lipid, cmp and EGFR, hBa1C  In 4 month  It is important that you exercise regularly at least 30 minutes 5 times a week. If you develop chest pain, have severe difficulty breathing, or feel very tired, stop exercising immediately and seek medical attention   Weight loss goal of 6 to 8 pounds please

## 2012-01-12 NOTE — Assessment & Plan Note (Signed)
Controlled, no change in medication DASH diet and commitment to daily physical activity for a minimum of 30 minutes discussed and encouraged, as a part of hypertension management. The importance of attaining a healthy weight is also discussed.  

## 2012-01-12 NOTE — Assessment & Plan Note (Signed)
Compliant with use of CPAP

## 2012-01-12 NOTE — Progress Notes (Signed)
  Subjective:    Patient ID: Pamela Stein, female    DOB: January 05, 1956, 56 y.o.   MRN: [id number]  HPI The PT is here for follow up and re-evaluation of chronic medical conditions, medication management and review of any available recent lab and radiology data.  Preventive health is updated, specifically  Cancer screening and Immunization.   Questions or concerns regarding consultations or procedures which the PT has had in the interim are  addressed. The PT denies any adverse reactions to current medications since the last visit.  There are no new concerns.  There are no specific complaints  C/o increased cough and wheeze in the past 2 weeks, denies fever , chills or sputum, c/o increased allergy symptoms Recently under increased stress on the job, has been using xanax sparingly    Review of Systems See HPI Denies recent fever or chills. Denies sinus pressure, nasal congestion, ear pain or sore throat.  Denies chest pains, palpitations and leg swelling Denies abdominal pain, nausea, vomiting,diarrhea or constipation.   Denies dysuria, frequency, hesitancy or incontinence. Denies joint pain, swelling and limitation in mobility. Denies headaches, seizures, numbness, or tingling. Denies depression, anxiety or insomnia. Denies skin break down or rash.        Objective:   Physical Exam Patient alert and oriented and in no cardiopulmonary distress.  HEENT: No facial asymmetry, EOMI, no sinus tenderness,  oropharynx pink and moist.  Neck supple no adenopathy.  Chest: Clear to auscultation bilaterally decreased air entry bilateral wheezes, no crackles  CVS: S1, S2 no murmurs, no S3.  ABD: Soft non tender. Bowel sounds normal.  Ext: No edema  MS: Adequate ROM spine, shoulders, hips and knees.  Skin: Intact, no ulcerations or rash noted.  Psych: Good eye contact, normal affect. Memory intact not anxious or depressed appearing.  CNS: CN 2-12 intact, power, tone and  sensation normal throughout.  Diabetic Foot Check:  Appearance - no lesions, ulcers or calluses Skin -onychomycosis right great toenail and tinea pedis between right 4th and 5th toes Sensation - grossly intact to light touch Monofilament testing -   Pulses Left - Dorsalis Pedis and Posterior Tibia normal Right - Dorsalis Pedis and Posterior Tibia normal       Assessment & Plan:

## 2012-01-12 NOTE — Assessment & Plan Note (Signed)
Improved Patient educated about the importance of limiting  Carbohydrate intake , the need to commit to daily physical activity for a minimum of 30 minutes , and to commit weight loss. The fact that changes in all these areas will reduce or eliminate all together the development of diabetes is stressed.    

## 2012-01-21 ENCOUNTER — Telehealth: Payer: Self-pay | Admitting: Family Medicine

## 2012-01-25 ENCOUNTER — Other Ambulatory Visit: Payer: Self-pay | Admitting: Obstetrics and Gynecology

## 2012-01-25 DIAGNOSIS — Z139 Encounter for screening, unspecified: Secondary | ICD-10-CM

## 2012-02-12 NOTE — Telephone Encounter (Signed)
Called and left message for patient to return call.  

## 2012-02-20 ENCOUNTER — Other Ambulatory Visit: Payer: Self-pay | Admitting: Family Medicine

## 2012-03-03 ENCOUNTER — Ambulatory Visit (HOSPITAL_COMMUNITY): Payer: Federal, State, Local not specified - PPO

## 2012-03-17 ENCOUNTER — Ambulatory Visit (HOSPITAL_COMMUNITY)
Admission: RE | Admit: 2012-03-17 | Discharge: 2012-03-17 | Disposition: A | Discharge status: Home / Self Care | Payer: Federal, State, Local not specified - PPO | Source: Ambulatory Visit | Attending: Obstetrics and Gynecology | Admitting: Obstetrics and Gynecology

## 2012-03-17 DIAGNOSIS — Z1231 Encounter for screening mammogram for malignant neoplasm of breast: Secondary | ICD-10-CM | POA: Insufficient documentation

## 2012-03-17 DIAGNOSIS — Z139 Encounter for screening, unspecified: Secondary | ICD-10-CM

## 2012-03-17 IMAGING — MG MM DIGITAL SCREENING
4 series · 4 of 4 positions shown · non-contrast
Comparison: Previous exams.

CLINICAL DATA: Screening.

DIGITAL BILATERAL SCREENING MAMMOGRAM WITH CAD

[L CC]
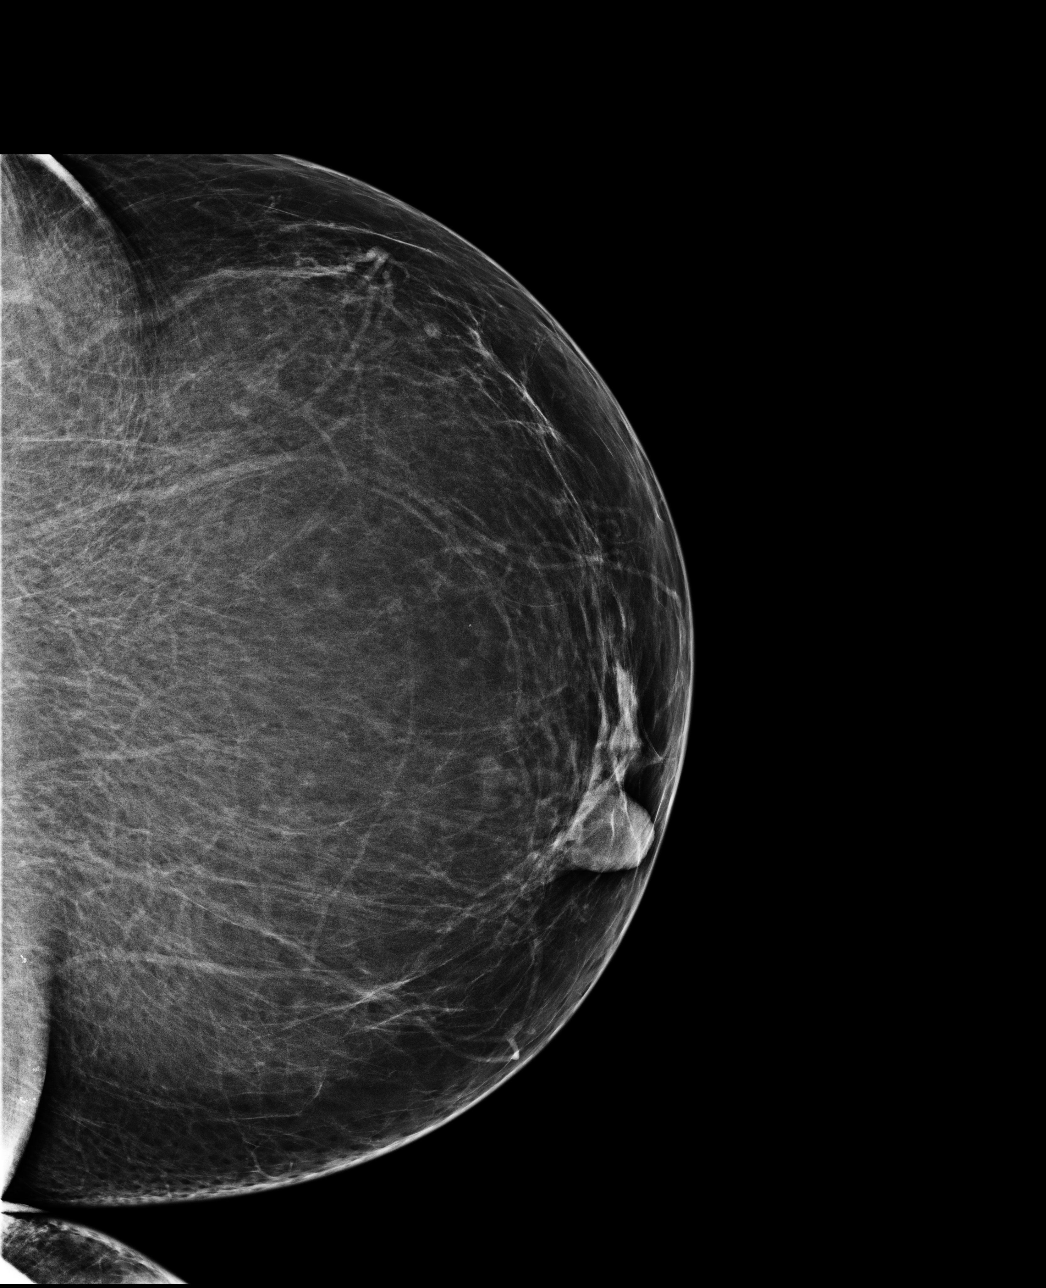

[L MLO]
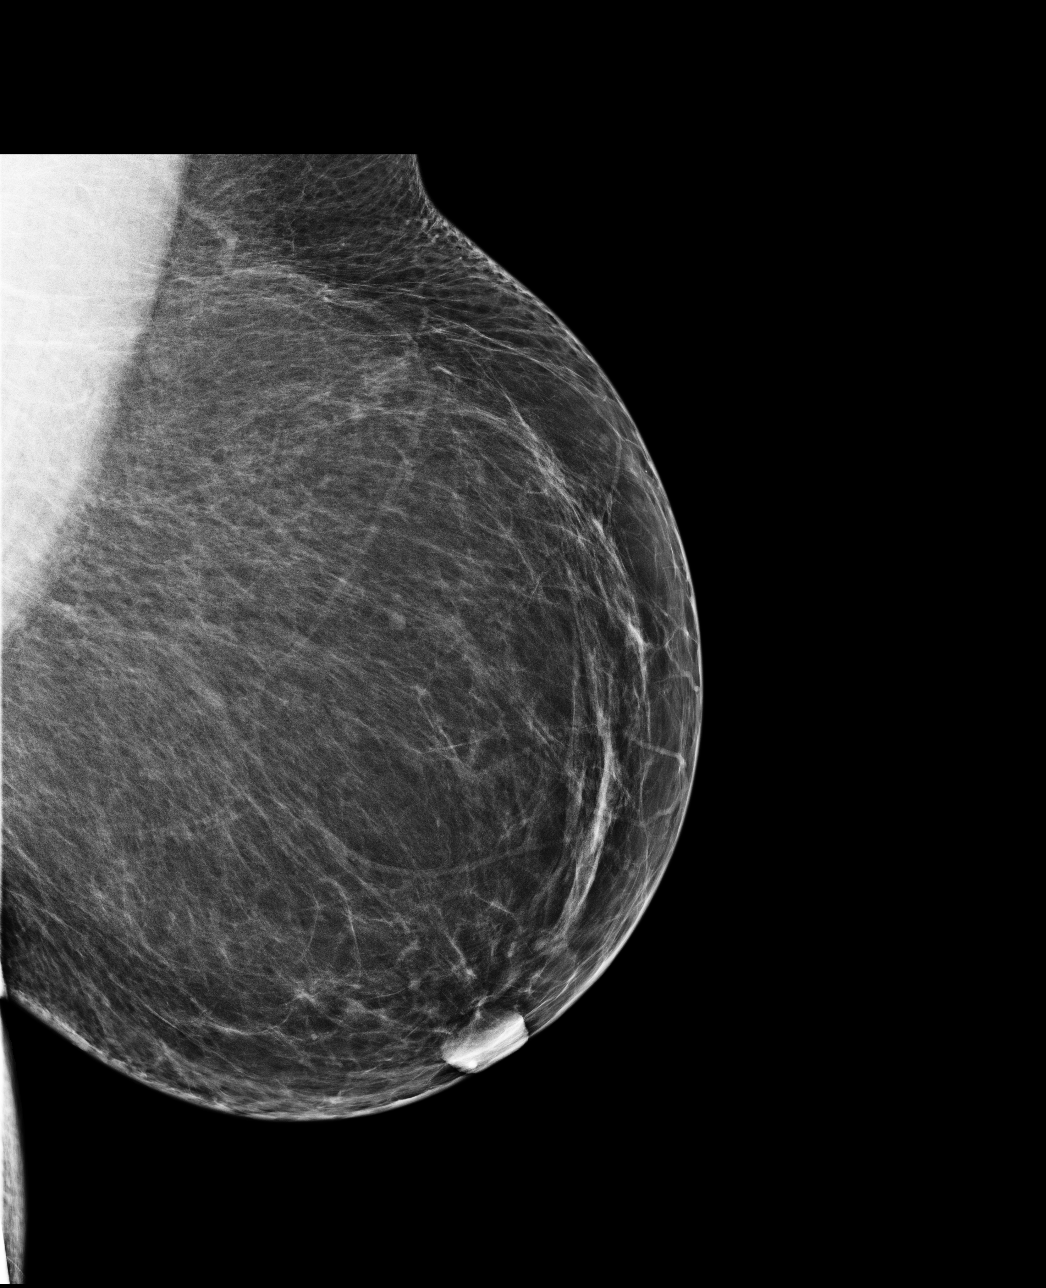

[R CC]
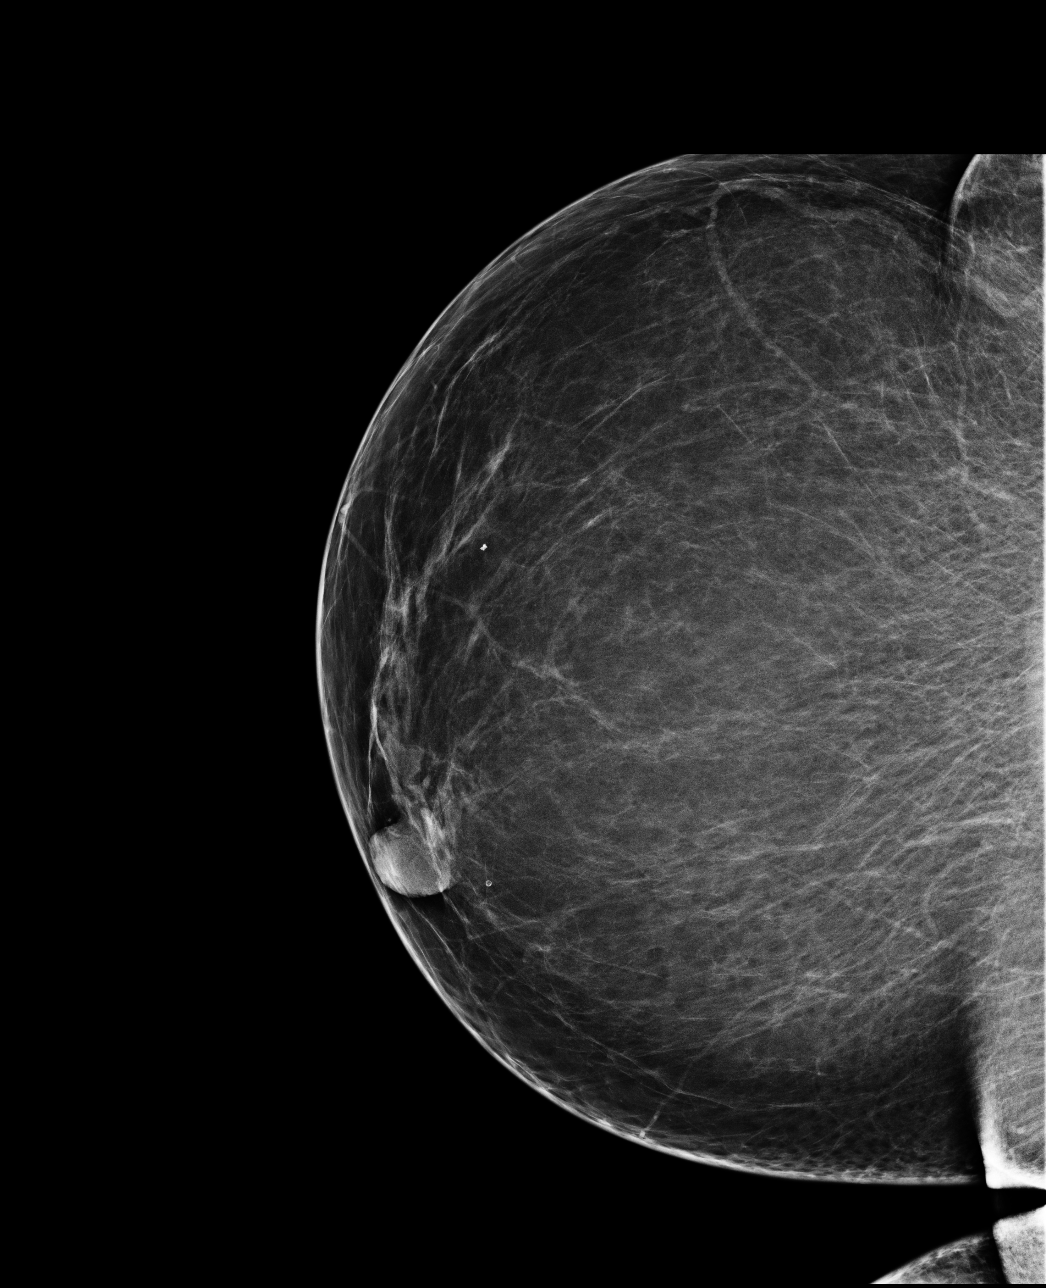

[R MLO]
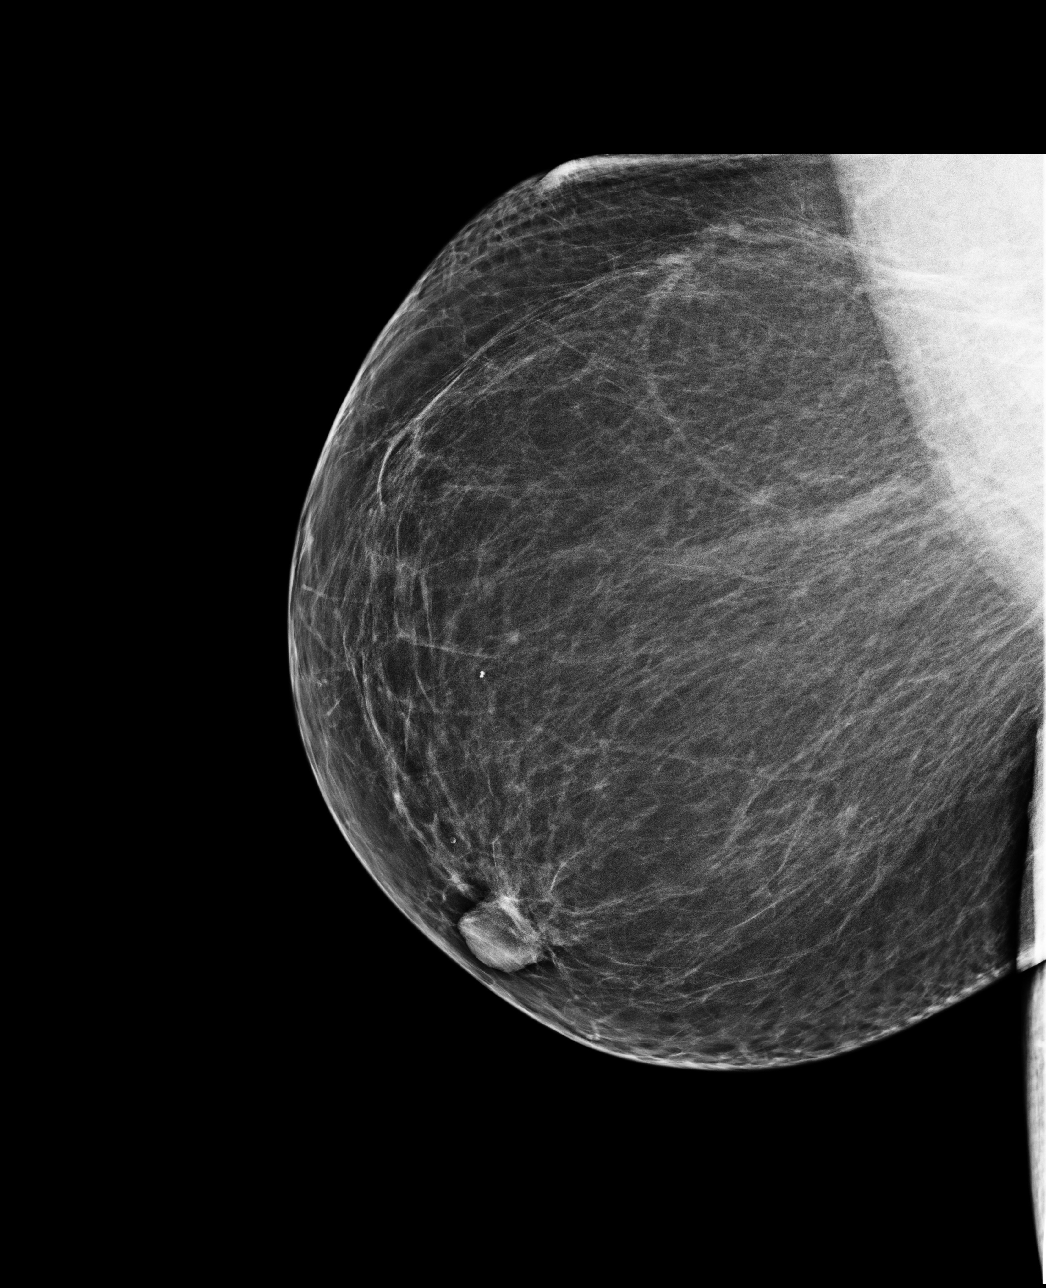

[4 of 4 positions shown; findings below may reference images not displayed]

FINDINGS: ACR Breast Density Category 2: There is a scattered fibroglandular
pattern.

No suspicious masses, architectural distortion, or calcifications
are present.

Images were processed with CAD.
IMPRESSION: No mammographic evidence of malignancy.

A result letter of this screening mammogram will be mailed directly
to the patient.

RECOMMENDATION:
Screening mammogram in one year. (Code:[FI])

BI-RADS CATEGORY 1:  Negative.

## 2012-05-10 ENCOUNTER — Telehealth: Payer: Self-pay | Admitting: Family Medicine

## 2012-05-10 NOTE — Telephone Encounter (Signed)
Left message that order was faxed to lab

## 2012-05-12 ENCOUNTER — Ambulatory Visit: Payer: Federal, State, Local not specified - PPO | Admitting: Family Medicine

## 2012-05-14 LAB — LIPID PANEL
LDL Cholesterol: 168 mg/dL — ABNORMAL HIGH (ref 0–99)
Total CHOL/HDL Ratio: 5.4 Ratio
VLDL: 24 mg/dL (ref 0–40)

## 2012-05-14 LAB — COMPLETE METABOLIC PANEL WITH GFR
ALT: 11 U/L (ref 0–35)
AST: 14 U/L (ref 0–37)
Albumin: 4 g/dL (ref 3.5–5.2)
Alkaline Phosphatase: 90 U/L (ref 39–117)
Calcium: 10.2 mg/dL (ref 8.4–10.5)
Chloride: 107 mEq/L (ref 96–112)
Potassium: 4.8 mEq/L (ref 3.5–5.3)

## 2012-05-14 LAB — HEMOGLOBIN A1C
Hgb A1c MFr Bld: 6.5 % — ABNORMAL HIGH (ref <5.7)
Mean Plasma Glucose: 140 mg/dL — ABNORMAL HIGH (ref <117)

## 2012-05-19 ENCOUNTER — Ambulatory Visit (INDEPENDENT_AMBULATORY_CARE_PROVIDER_SITE_OTHER): Payer: Federal, State, Local not specified - PPO | Admitting: Family Medicine

## 2012-05-19 ENCOUNTER — Encounter: Payer: Self-pay | Admitting: Family Medicine

## 2012-05-19 VITALS — BP 140/82 | HR 74 | Resp 16 | Ht 62.0 in | Wt 214.4 lb

## 2012-05-19 DIAGNOSIS — R058 Other specified cough: Secondary | ICD-10-CM

## 2012-05-19 DIAGNOSIS — E669 Obesity, unspecified: Secondary | ICD-10-CM

## 2012-05-19 DIAGNOSIS — I10 Essential (primary) hypertension: Secondary | ICD-10-CM

## 2012-05-19 DIAGNOSIS — R5381 Other malaise: Secondary | ICD-10-CM

## 2012-05-19 DIAGNOSIS — B351 Tinea unguium: Secondary | ICD-10-CM

## 2012-05-19 DIAGNOSIS — R05 Cough: Secondary | ICD-10-CM

## 2012-05-19 DIAGNOSIS — E785 Hyperlipidemia, unspecified: Secondary | ICD-10-CM

## 2012-05-19 DIAGNOSIS — E119 Type 2 diabetes mellitus without complications: Secondary | ICD-10-CM

## 2012-05-19 DIAGNOSIS — G4733 Obstructive sleep apnea (adult) (pediatric): Secondary | ICD-10-CM

## 2012-05-19 DIAGNOSIS — R059 Cough, unspecified: Secondary | ICD-10-CM

## 2012-05-19 MED ORDER — PRAVASTATIN SODIUM 80 MG PO TABS: 80.0000 mg | ORAL_TABLET | Freq: Every evening | ORAL | Status: DC

## 2012-05-19 MED ORDER — ALBUTEROL SULFATE HFA 108 (90 BASE) MCG/ACT IN AERS: 2.0000 | INHALATION_SPRAY | Freq: Four times a day (QID) | RESPIRATORY_TRACT | Status: DC | PRN

## 2012-05-19 MED ORDER — TRIAMTERENE-HCTZ 37.5-25 MG PO TABS: ORAL_TABLET | ORAL | Status: DC

## 2012-05-19 NOTE — Assessment & Plan Note (Signed)
Controlled, no change in medication Patient advised to reduce carb and sweets, commit to regular physical activity, take meds as prescribed, test blood as directed, and attempt to lose weight, to improve blood sugar control.  

## 2012-05-19 NOTE — Assessment & Plan Note (Signed)
Systolic elevated at visit. DASH diet and commitment to daily physical activity for a minimum of 30 minutes discussed and encouraged, as a part of hypertension management. The importance of attaining a healthy weight is also discussed.

## 2012-05-19 NOTE — Patient Instructions (Addendum)
F/u in 4.5 month  Your cholesterol is still too high, dose increase ion the pravachol take TWO 40mg  tabs till done, new dose is 80mg  daily.  You will be contacted re iron and vit D  Levels when we get the results so you know if you need to continue them   It is important that you exercise regularly at least 30 minutes 5 times a week. If you develop chest pain, have severe difficulty breathing, or feel very tired, stop exercising immediately and seek medical attention   A healthy diet is rich in fruit, vegetables and whole grains. Poultry fish, nuts and beans are a healthy choice for protein rather then red meat. A low sodium diet and drinking 64 ounces of water daily is generally recommended. Oils and sweet should be limited. Carbohydrates especially for those who are diabetic or overweight, should be limited to 30-45 gram per meal. It is important to eat on a regular schedule, at least 3 times daily. Snacks should be primarily fruits, vegetables or nuts.  Please sched eye exam  Pls plan on 8 pound weight loss or else class  Fasting lipid, cmp and rGFR and HBa1C, cbc, microalb in 4.5 month,

## 2012-05-19 NOTE — Assessment & Plan Note (Signed)
Unchanged. Patient re-educated about  the importance of commitment to a  minimum of 150 minutes of exercise per week. The importance of healthy food choices with portion control discussed. Encouraged to start a food diary, count calories and to consider  joining a support group. Sample diet sheets offered. Goals set by the patient for the next several months.    

## 2012-05-19 NOTE — Assessment & Plan Note (Signed)
Currently satble will flare in the Spring and Fall

## 2012-05-19 NOTE — Assessment & Plan Note (Signed)
Uncontrolled, dose increase in pravachol, and dietary change

## 2012-05-19 NOTE — Assessment & Plan Note (Signed)
Persists, has bee on lamisil

## 2012-05-19 NOTE — Progress Notes (Signed)
  Subjective:    Patient ID: Pamela Stein, female    DOB: 23-Sep-1955, 57 y.o.   MRN: [id number]  HPI The PT is here for follow up and re-evaluation of chronic medical conditions, medication management and review of any available recent lab and radiology data.  Preventive health is updated, specifically  Cancer screening and Immunization.   Questions or concerns regarding consultations or procedures which the PT has had in the interim are  addressed. The PT denies any adverse reactions to current medications since the last visit.  There are no new concerns.  There are no specific complaints       Review of Systems See HPI Denies recent fever or chills. Denies sinus pressure, nasal congestion, ear pain or sore throat. Denies chest congestion, productive cough or wheezing. Denies chest pains, palpitations and leg swelling Denies abdominal pain, nausea, vomiting,diarrhea or constipation.   Denies dysuria, frequency, hesitancy or incontinence. Denies joint pain, swelling and limitation in mobility. Denies headaches, seizures, numbness, or tingling. Denies depression, anxiety or insomnia. Denies skin break down or rash.        Objective:   Physical Exam  Patient alert and oriented and in no cardiopulmonary distress.  HEENT: No facial asymmetry, EOMI, no sinus tenderness,  oropharynx pink and moist.  Neck supple no adenopathy.  Chest: Clear to auscultation bilaterally.  CVS: S1, S2 no murmurs, no S3.  ABD: Soft non tender. Bowel sounds normal.  Ext: No edema  MS: Adequate ROM spine, shoulders, hips and knees.  Skin: Intact, no ulcerations or rash noted.  Psych: Good eye contact, normal affect. Memory intact not anxious or depressed appearing.  CNS: CN 2-12 intact, power, tone and sensation normal throughout.       Assessment & Plan:

## 2012-05-19 NOTE — Assessment & Plan Note (Signed)
Importance of regular CPAP use is stressed

## 2012-06-09 ENCOUNTER — Telehealth: Payer: Self-pay | Admitting: Family Medicine

## 2012-06-09 NOTE — Telephone Encounter (Signed)
Script written

## 2012-06-10 NOTE — Telephone Encounter (Signed)
Faxed to [facility] as requested.

## 2012-06-18 ENCOUNTER — Other Ambulatory Visit: Payer: Self-pay | Admitting: Family Medicine

## 2012-08-18 ENCOUNTER — Other Ambulatory Visit: Payer: Self-pay

## 2012-09-27 ENCOUNTER — Ambulatory Visit: Payer: Federal, State, Local not specified - PPO | Admitting: Family Medicine

## 2012-10-26 ENCOUNTER — Other Ambulatory Visit: Payer: Self-pay | Admitting: Family Medicine

## 2012-10-26 LAB — CBC WITH DIFFERENTIAL/PLATELET
Hemoglobin: 12.2 g/dL (ref 12.0–15.0)
Lymphocytes Relative: 33 % (ref 12–46)
Lymphs Abs: 3.5 10*3/uL (ref 0.7–4.0)
Neutro Abs: 6.1 10*3/uL (ref 1.7–7.7)
Neutrophils Relative %: 58 % (ref 43–77)
Platelets: 368 10*3/uL (ref 150–400)
RBC: 5.83 MIL/uL — ABNORMAL HIGH (ref 3.87–5.11)
WBC: 10.5 10*3/uL (ref 4.0–10.5)

## 2012-10-26 LAB — LIPID PANEL
Cholesterol: 219 mg/dL — ABNORMAL HIGH (ref 0–200)
HDL: 50 mg/dL (ref >39)
Total CHOL/HDL Ratio: 4.4 Ratio
Triglycerides: 139 mg/dL (ref <150)

## 2012-10-26 LAB — COMPLETE METABOLIC PANEL WITH GFR
ALT: 16 U/L (ref 0–35)
AST: 15 U/L (ref 0–37)
Alkaline Phosphatase: 97 U/L (ref 39–117)
BUN: 19 mg/dL (ref 6–23)
Creat: 1.06 mg/dL (ref 0.50–1.10)
Potassium: 5.1 mEq/L (ref 3.5–5.3)

## 2012-10-27 ENCOUNTER — Encounter: Payer: Self-pay | Admitting: Family Medicine

## 2012-10-27 ENCOUNTER — Encounter: Payer: Self-pay | Admitting: Adult Health

## 2012-10-27 ENCOUNTER — Ambulatory Visit (INDEPENDENT_AMBULATORY_CARE_PROVIDER_SITE_OTHER): Payer: Federal, State, Local not specified - PPO | Admitting: Adult Health

## 2012-10-27 ENCOUNTER — Ambulatory Visit (INDEPENDENT_AMBULATORY_CARE_PROVIDER_SITE_OTHER): Payer: Federal, State, Local not specified - PPO | Admitting: Family Medicine

## 2012-10-27 VITALS — BP 122/70 | HR 74 | Ht 62.5 in | Wt 214.0 lb

## 2012-10-27 VITALS — BP 126/80 | HR 86 | Resp 18 | Ht 62.0 in | Wt 213.1 lb

## 2012-10-27 DIAGNOSIS — F329 Major depressive disorder, single episode, unspecified: Secondary | ICD-10-CM

## 2012-10-27 DIAGNOSIS — E119 Type 2 diabetes mellitus without complications: Secondary | ICD-10-CM

## 2012-10-27 DIAGNOSIS — Z1212 Encounter for screening for malignant neoplasm of rectum: Secondary | ICD-10-CM

## 2012-10-27 DIAGNOSIS — Z01419 Encounter for gynecological examination (general) (routine) without abnormal findings: Secondary | ICD-10-CM

## 2012-10-27 DIAGNOSIS — E785 Hyperlipidemia, unspecified: Secondary | ICD-10-CM

## 2012-10-27 DIAGNOSIS — F32A Depression, unspecified: Secondary | ICD-10-CM

## 2012-10-27 DIAGNOSIS — E669 Obesity, unspecified: Secondary | ICD-10-CM

## 2012-10-27 DIAGNOSIS — Z23 Encounter for immunization: Secondary | ICD-10-CM

## 2012-10-27 DIAGNOSIS — G4733 Obstructive sleep apnea (adult) (pediatric): Secondary | ICD-10-CM

## 2012-10-27 DIAGNOSIS — I10 Essential (primary) hypertension: Secondary | ICD-10-CM

## 2012-10-27 LAB — HEMOGLOBIN A1C: Mean Plasma Glucose: 148 mg/dL — ABNORMAL HIGH (ref <117)

## 2012-10-27 LAB — MICROALBUMIN / CREATININE URINE RATIO
Creatinine, Urine: 102.1 mg/dL
Microalb, Ur: 0.77 mg/dL (ref 0.00–1.89)

## 2012-10-27 LAB — HEMOCCULT GUIAC POC 1CARD (OFFICE)

## 2012-10-27 MED ORDER — TEMAZEPAM 15 MG PO CAPS: 15.0000 mg | ORAL_CAPSULE | Freq: Every evening | ORAL | Status: DC | PRN

## 2012-10-27 MED ORDER — FLUOXETINE HCL 10 MG PO CAPS: 10.0000 mg | ORAL_CAPSULE | Freq: Every day | ORAL | Status: DC

## 2012-10-27 NOTE — Progress Notes (Signed)
Patient ID: Pamela Stein, female   DOB: Jan 02, 1956, 57 y.o.   MRN: [id number] History of Present Illness: Pamela Stein is a 57 year old black female single in for a physical.Has appt with Dr Lodema Hong today.  Current Medications, Allergies, Past Medical History, Past Surgical History, Family History and Social History were reviewed in [name].     Review of Systems: Patient denies any  blurred vision, shortness of breath, chest pain, abdominal pain, problems with bowel movements. Has some SUI, not having sex.Has been having headaches(had eye exam) and is stressed at work, works 3rd shift at [employer].No joint swelling noted.    Physical Exam:BP 122/70  Pulse 74  Ht 5' 2.5" (1.588 m)  Wt 214 lb (97.07 kg)  BMI 38.49 kg/m2 General:  Well developed, well nourished, no acute distress Skin:  Warm and dry Neck:  Midline trachea, normal thyroid Lungs; Clear to auscultation bilaterally Breast:  No dominant palpable mass, retraction, or nipple discharge Cardiovascular: Regular rate and rhythm Abdomen:  Soft, non tender, no hepatosplenomegaly Pelvic:  External genitalia is normal in appearance.  The vagina is normal in appearance for age has some relaxation. Cervix and uterus are absent. No  adnexal masses or tenderness noted. Rectal: Good sphincter tone, no polyps, or hemorrhoids felt.  Hemoccult negative. Extremities:  No swelling or varicosities noted Psych:  Alert and cooperative    Impression: Yearly gyn exam no pap History of diabetes,hypertension,obesity and OSA    Plan: Physical in 1 year Mammogram yearly Colonoscopy 2018 Labs with PCP Tell Dr Lodema Hong about headaches and stress today

## 2012-10-27 NOTE — Patient Instructions (Addendum)
Physical in 1 year Mammogram yearly Colonoscopy 2018 Labs with PCP

## 2012-10-27 NOTE — Patient Instructions (Addendum)
F/u in 6.5 weeks, call if you need me before  Flu vaccine today  You are starting fluoxetine for depression and are referred to a therapist. PLEASE do keep the appointment, this will be very beneficial, also check into and start water exercise   New for sleep is restoril take at bedtime  Xanax is discontinued    Please reduce fried and fatty foods, cholesterol is improved, but is still high  Please call about your CPAP machine, if you need to see Dr Maple Hudson again and need a referral let me know

## 2012-10-29 NOTE — Assessment & Plan Note (Signed)
Controlled, no change in medication DASH diet and commitment to daily physical activity for a minimum of 30 minutes discussed and encouraged, as a part of hypertension management. The importance of attaining a healthy weight is also discussed.  

## 2012-10-29 NOTE — Assessment & Plan Note (Signed)
Unchanged. Patient re-educated about  the importance of commitment to a  minimum of 150 minutes of exercise per week. The importance of healthy food choices with portion control discussed. Encouraged to start a food diary, count calories and to consider  joining a support group. Sample diet sheets offered. Goals set by the patient for the next several months.    

## 2012-10-29 NOTE — Assessment & Plan Note (Signed)
Uncontrolled and severe, not suicidal or homicidal, no hallucinations Med started and referred to therapy

## 2012-10-29 NOTE — Progress Notes (Signed)
  Subjective:    Patient ID: Pamela Stein, female    DOB: Sep 16, 1955, 57 y.o.   MRN: [id number]  HPI The PT is here for follow up and re-evaluation of chronic medical conditions, medication management and review of any available recent lab and radiology data.  Preventive health is updated, specifically  Cancer screening and Immunization.   Questions or concerns regarding consultations or procedures which the PT has had in the interim are  Addressed.Recently had gyne exam no problems noted The PT denies any adverse reactions to current medications since the last visit.  C/o increased job stress which has her severely depressed, not suicidal, homicidal or hallucinating    Review of Systems See HPI Denies recent fever or chills. Denies sinus pressure, nasal congestion, ear pain or sore throat. Denies chest congestion, productive cough or wheezing. Denies chest pains, palpitations and leg swelling.Chronic foot pain Denies abdominal pain, nausea, vomiting,diarrhea or constipation.   Denies dysuria, frequency, hesitancy or incontinence. Denies joint pain, swelling and limitation in mobility.Chronic bilateral foot pain Denies headaches, seizures, numbness  Denies skin break down or rash.        Objective:   Physical Exam  Patient alert and oriented and in no cardiopulmonary distress.Poor eye contact  HEENT: No facial asymmetry, EOMI, no sinus tenderness,  oropharynx pink and moist.  Neck supple no adenopathy.  Chest: Clear to auscultation bilaterally.  CVS: S1, S2 no murmurs, no S3.  ABD: Soft non tender. Bowel sounds normal.  Ext: No edema  MS: Adequate ROM spine, shoulders, hips and knees.  Skin: Intact, no ulcerations or rash noted.  Psych: Flat  affect. Memory intact not anxious but depressed appearing.Tearful at times  CNS: CN 2-12 intact, power, tone and sensation normal throughout.       Assessment & Plan:

## 2012-10-29 NOTE — Assessment & Plan Note (Signed)
Improved but still uncontrolled Hyperlipidemia:Low fat diet discussed and encouraged.  Pt to take med daily as prescribed, reports unintentional non compliance

## 2012-10-29 NOTE — Assessment & Plan Note (Signed)
sttaes needs new CPAP machine wll obtain through her inscompany

## 2012-10-29 NOTE — Assessment & Plan Note (Signed)
Less well, but adequate control. No med change Patient advised to reduce carb and sweets, commit to regular physical activity, take meds as prescribed, test blood as directed, and attempt to lose weight, to improve blood sugar control.

## 2012-11-03 ENCOUNTER — Telehealth: Payer: Self-pay | Admitting: Family Medicine

## 2012-11-03 NOTE — Telephone Encounter (Signed)
I will send a script  for the CPAP machine, as far as managing the pressure etc I do not sign off on that pls again ensure she understands his , I discussed at OV

## 2012-11-03 NOTE — Telephone Encounter (Signed)
Called patient and left message for them to return call at the office   

## 2012-11-03 NOTE — Telephone Encounter (Signed)
Please advise 

## 2012-11-04 NOTE — Telephone Encounter (Signed)
Patient aware.

## 2012-11-21 ENCOUNTER — Ambulatory Visit (INDEPENDENT_AMBULATORY_CARE_PROVIDER_SITE_OTHER): Payer: Federal, State, Local not specified - PPO | Admitting: Psychiatry

## 2012-11-21 DIAGNOSIS — F4323 Adjustment disorder with mixed anxiety and depressed mood: Secondary | ICD-10-CM

## 2012-11-22 ENCOUNTER — Encounter (HOSPITAL_COMMUNITY): Payer: Self-pay | Admitting: Psychiatry

## 2012-11-22 NOTE — Progress Notes (Signed)
Patient:   Pamela Stein   DOB:   10/29/1955  MR Number:  [id number]  Location:  [address], [place], [place] [address]  Date of Service:   Monday 11/21/2012  Start Time:   3;20 PM End Time:   4:10 PM  Provider/Observer:  Florencia Reasons, MSW, LCSW   Billing Code/Service:  [phone]  Chief Complaint:     Chief Complaint  Patient presents with  . Depression  . Anxiety   Reason for Service:  Patient is referred for services by primary care physician Dr. Syliva Overman due to patient experiencing symptoms of depression. Per patient's report , she is experiencing injustice and prejudice on her job with the [employer] where she has been employed for 20 years. Patient began applying for supervisory jobs in 2008 but has never gotten a promotion. Per patient's report, she has been overlooked for several positions but has been  the one to train several supervisors and fill in for managers in her department.  About 3 or 4 months ago,  another supervisory position was filled. Patient did not apply for the job as she states she knew she would not get it and Conservation officer, nature hired a friend for the position. Since that time, patient states not having any joy in her job, not wanting to go to work, and not being happy. Patient states she normally likes to interact with people and that she normally smiles. Patient states she now feels like she just isn't good enough. Patient has filed an EEO complaint.   Current Status:   Patient reports depressed mood, anxiety, insomnia, memory difficulty, low energy, and poor motivation.   Reliability of Information:  Reliable   Behavioral Observation: SHASHANA FULLINGTON  presents as a 57 y.o.-year-old Right- handed African American Female who appeared younger than her stated age. Her dress was appropriate and she was casual.  Her manners were appropriate to the situation.  There were not any physical disabilities noted.  She displayed an appropriate level of  cooperation and motivation.    Interactions:    Active   Attention:   within normal limits  Memory:   Impaired immediate - recalled 2/3 words  Visuo-spatial:   within normal limits  Speech (Volume):  normal  Speech:   normal pitch and normal volume  Thought Process:  Coherent and Relevant  Though Content:  WNL  Orientation:   person, place, time/date, situation, day of week, month of year and year  Judgment:   Good  Planning:   Good  Affect:    Depressed  Mood:    Anxious and Depressed  Insight:   Good  Intelligence:   normal  Marital Status/Living:  Patient was born and reared in [place]. She is the youngest of 2 siblings. Her parents were never married. Patient's mother died when she was 3 years old. Patient along with her younger sister was reared by her paternal great aunt. She reports she did not find out who her biological father wasn't and she was 5 years old but really never had a relationship with father. She describes her childhood as good as her aunt was very loving. Patient has been married 3 times. Her first marriage ended after 5 years due to to husband's infidelity and drug use. She left her second marriage after 3 years due to to husband's infidelity. She left her third husband after 3 years as husband was not committed to the relationship but just wanted patient's life insurance per  her report. Patient resides alone in [place]. Her sister resides in [place]. Patient spends her recreational time by attending church, singing with her church choir, and taking care of her dogs. She states sometimes being with friends.  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  Past Employment:   Patient reports working at the [employer] office for 11 years.  Substance Use:  No concerns of substance abuse are reported.   Education:   Patient has a two-year degree in Land from [employer].   Medical History:   Past Medical History  Diagnosis Date  . Diabetes mellitus   . Hyperlipidemia   . Hypertension   . Obesity   . Obstructive sleep apnea   . Back pain     Sexual History:   History  Sexual Activity  . Sexual Activity: Not Currently  . 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  Psychiatric History:  Patient denies any psychiatric hospitalizations and reports no psychotropic medications. She participated briefly in marital counseling during her third marriage.  Family Med/Psych History:  Family History  Problem Relation Age of Onset  . Cancer Father     throat  . Diabetes Maternal Grandmother   . Hypertension Maternal Grandfather   . Hypertension Paternal Grandmother   . Diabetes Paternal Grandfather    paternal aunt-depression  Risk of Suicide/Violence: Patient reports no past or current suicidal ideations or homicidal ideations. She reports no self-injurious behaviors, violence, or aggression to   Impression/DX:   The patient presents with symptoms of anxiety and depression that began about 2 or 3 months ago after a supervisory position was filled in patient's department. Patient did not apply for the position but has applied for supervisory positions since 2008 and has not been promoted. Patient reports feeling like a victim of injustice and prejudice as her manager has chosen males, friends, and relatives to fill the positions per her report. Her current symptoms include depressed mood, anxiety, insomnia, memory difficulty, low energy, and poor motivation. Patient reports no previous episodes of depression or mania. Diagnosis: Adjustment disorder with mixed anxiety and depressed mood.  Disposition/Plan:   the patient attends the assessment appointment today. Confidentiality and limits are discussed. The  patient agrees to return for an appointment in one to 2 weeks for continuing assessment and treatment planning. She also agrees to call this practice, call 911, or have someone take her to the emergency room should symptoms worsen.  Diagnosis:    Axis I:  Adjustment disorder with mixed anxiety and depressed mood      Axis II: Deferred       Axis III:  see medical history       Axis IV:  occupational problems          Axis V:  51-60 moderate symptoms

## 2012-11-22 NOTE — Patient Instructions (Signed)
Discussed orally 

## 2012-11-28 ENCOUNTER — Ambulatory Visit (INDEPENDENT_AMBULATORY_CARE_PROVIDER_SITE_OTHER): Payer: Federal, State, Local not specified - PPO | Admitting: Psychiatry

## 2012-11-28 DIAGNOSIS — F4323 Adjustment disorder with mixed anxiety and depressed mood: Secondary | ICD-10-CM

## 2012-11-28 NOTE — Progress Notes (Addendum)
Patient:  Pamela Stein   DOB: Dec 12, 1955  MR Number: [id number]  Location: [facility]:  [address] [place],  [place], [address]  Start: Monday 11/28/2012 11:05 AM End: Monday 10/20.2014 11:50 AM  Provider/Observer:     Florencia Reasons, MSW, LCSW   Chief Complaint:      Chief Complaint  Patient presents with  . Stress  . Depression    Reason For Service:     Patient is referred for services by primary care physician Dr. Syliva Overman due to patient experiencing symptoms of depression. Per patient's report , she is experiencing injustice and prejudice on her job with the [employer] where she has been employed for 20 years. Patient began applying for supervisory jobs in 2008 but has never gotten a promotion. 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 About 3 or 4 months ago, another supervisory position was filled. Patient did not apply for the job as she states she knew she would not get it and Conservation officer, nature hired a friend for the position. Since that time, patient states not having any joy in her job, not wanting to go to work, and not being happy. Patient states she normally likes to interact with people and that she normally smiles. Patient states she now feels like she just isn't good enough. Patient has filed an EEO complaint. Patient is seen for follow up appointment.   Interventions Strategy:  Supportive therapy, psychoeducation  Participation Level:   Active  Participation Quality:  Appropriate      Behavioral Observation:  Casual, Alert, and Appropriate.   Current Psychosocial Factors: Patient's friend was hospitalized yesterday. Patient reports continued job stress.  Content of Session:   Establishing rapport, identifying strengths and interests, identifying support system, identifying ways to improve self-care, practicing  relaxation technique  Current Status:   Patient reports depressed mood, anxiety, memory difficulty, low energy, and poor motivation. She reports some improvement in sleep pattern last night  Patient Progress:   Patient reports little to no change in symptoms since initial session. She still expresses hopelessness regarding her job but does say  she can look forward to retiring in 3-1/2 years. Patient continues to experience poor motivation regarding her job but has been motivated to continue her involvement in singing. Patient has a strong interest in music and singing and has been singing since she was 57 years old. She currently sings in 3-4 choirs. She discusses her spirituality today and is able to identify ways to use her interest in singing and her faith as coping tools. Therapist works with patient to identify ways to improve self-care. Therapist also works with patient to practice a relaxation technique using diaphragmatic breathing to reduce anxiety and stress.  Target Goals:   Establishing therapeutic alliance, reducing anxiety  Last Reviewed:     Goals Addressed Today:    Establishing therapeutic alliance, reducing anxiety  Impression/Diagnosis:   The patient presents with symptoms of anxiety and depression that began about 2 or 3 months ago after a supervisory position was filled in patient's department. Patient did not apply for the position but has applied for supervisory positions since 2008 and has not been promoted. Patient reports feeling like a victim of injustice and prejudice as her manager has chosen males, friends, and relatives to fill the positions per her report. Her current symptoms include depressed mood,  anxiety, insomnia, memory difficulty, low energy, and poor motivation. Patient reports no previous episodes of depression or mania. Diagnosis: Adjustment disorder with mixed anxiety and depressed mood   Diagnosis:  Axis I: Adjustment disorder with mixed anxiety and  depressed mood          Axis II: Deferred

## 2012-11-28 NOTE — Patient Instructions (Signed)
Discussed orally 

## 2012-12-08 ENCOUNTER — Ambulatory Visit (HOSPITAL_COMMUNITY): Payer: Self-pay | Admitting: Psychiatry

## 2012-12-08 ENCOUNTER — Ambulatory Visit: Payer: Federal, State, Local not specified - PPO | Admitting: Family Medicine

## 2012-12-16 ENCOUNTER — Ambulatory Visit (INDEPENDENT_AMBULATORY_CARE_PROVIDER_SITE_OTHER): Payer: Federal, State, Local not specified - PPO | Admitting: Psychiatry

## 2012-12-16 DIAGNOSIS — F4323 Adjustment disorder with mixed anxiety and depressed mood: Secondary | ICD-10-CM

## 2012-12-16 NOTE — Patient Instructions (Signed)
Discussed orally 

## 2012-12-16 NOTE — Progress Notes (Signed)
Patient:  Pamela Stein   DOB: 1955-05-15  MR Number: [id number]  Location: [facility]:  [address] [place],  [place], [address]  Start: Friday 12/16/2012 11:10 AM End: Friday 12/16/2012 11:55 AM  Provider/Observer:     Florencia Reasons, MSW, LCSW   Chief Complaint:      Chief Complaint  Patient presents with  . Anxiety  . Depression    Reason For Service:     Patient is referred for services by primary care physician Dr. Syliva Overman due to patient experiencing symptoms of depression. Per patient's report , she is experiencing injustice and prejudice on her job with the [employer] where she has been employed for 20 years. Patient began applying for supervisory jobs in 2008 but has never gotten a promotion. 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 About 3 or 4 months ago, another supervisory position was filled. Patient did not apply for the job as she states she knew she would not get it and Conservation officer, nature hired a friend for the position. Since that time, patient states not having any joy in her job, not wanting to go to work, and not being happy. Patient states she normally likes to interact with people and that she normally smiles. Patient states she now feels like she just isn't good enough. Patient has filed an EEO complaint. Patient is seen for follow up appointment.   Interventions Strategy:  Supportive therapy, psychoeducation  Participation Level:   Active  Participation Quality:  Appropriate      Behavioral Observation:  Casual, Alert, and Appropriate.   Current Psychosocial Factors: Patient's reports recent conflict with man she has been dating intermittently for 30 years.  Content of Session:   Reviewing symptoms, processing feelings, reinforcing patient's efforts to improve self-care, developing treatment  plan  Current Status:   Patient reports less depressed mood, decreased anxiety, improved sleep pattern, and increased motivation.  Patient Progress:   Patient reports feeling better since last session. She is experiencing improved sleep pattern and reports increased motivation and energy. Patient has improved self-care regarding nutrition and plans to go to the [facility] tomorrow to develop exercise plan. She reports being more assertive at work and taking the initiative to request from her immediate supervisor a temporary change in her job duties. She is feeling better about her job and states deciding  to do her job to the best of her ability. She reports focusing on spirituality, trusting God, and praying as part of coping techniques. She continues to struggle with self acceptance and shares more information about her patterns in relationships. She currently is in a relationship with a man she has been dating intermittently for 30 years and expresses frustration regarding recent conflict. She reports pattern of blaming self and saying something is wrong with her when her relationships ,including her 3 marriages, have failed. She continues to have trust issues.  Therapist works with patient to process feelings and develop treatment plan.  Target Goals:   1. Improve self-care: 1:1 psychotherapy one time every one to 4 weeks (supportive, CBT)    2. Increase self acceptance: 1:1 psychotherapy one time every one to 4 weeks (supportive, CBT)  Last Reviewed:   12/16/2012  Goals Addressed Today:    1  Impression/Diagnosis:   The patient presents with symptoms of anxiety and depression that began about 2 or  3 months ago after a supervisory position was filled in patient's department. Patient did not apply for the position but has applied for supervisory positions since 2008 and has not been promoted. Patient reports feeling like a victim of injustice and prejudice as her manager has chosen males, friends, and  relatives to fill the positions per her report. Her current symptoms include depressed mood, anxiety, insomnia, memory difficulty, low energy, and poor motivation. Patient reports no previous episodes of depression or mania. Diagnosis: Adjustment disorder with mixed anxiety and depressed mood   Diagnosis:  Axis I: Adjustment disorder with mixed anxiety and depressed mood          Axis II: Deferred

## 2012-12-26 ENCOUNTER — Ambulatory Visit (INDEPENDENT_AMBULATORY_CARE_PROVIDER_SITE_OTHER): Payer: Federal, State, Local not specified - PPO | Admitting: Family Medicine

## 2012-12-26 ENCOUNTER — Encounter (INDEPENDENT_AMBULATORY_CARE_PROVIDER_SITE_OTHER): Payer: Self-pay

## 2012-12-26 ENCOUNTER — Encounter: Payer: Self-pay | Admitting: Family Medicine

## 2012-12-26 VITALS — BP 128/80 | HR 72 | Resp 18 | Ht 62.0 in | Wt 214.0 lb

## 2012-12-26 DIAGNOSIS — E785 Hyperlipidemia, unspecified: Secondary | ICD-10-CM

## 2012-12-26 DIAGNOSIS — E119 Type 2 diabetes mellitus without complications: Secondary | ICD-10-CM

## 2012-12-26 DIAGNOSIS — I10 Essential (primary) hypertension: Secondary | ICD-10-CM

## 2012-12-26 DIAGNOSIS — F32A Depression, unspecified: Secondary | ICD-10-CM

## 2012-12-26 DIAGNOSIS — E669 Obesity, unspecified: Secondary | ICD-10-CM

## 2012-12-26 DIAGNOSIS — F329 Major depressive disorder, single episode, unspecified: Secondary | ICD-10-CM

## 2012-12-26 DIAGNOSIS — Z139 Encounter for screening, unspecified: Secondary | ICD-10-CM

## 2012-12-26 NOTE — Patient Instructions (Addendum)
F/u in 2 month, call if you need me before  I am thankful that you are doing better , and are  benefiting from therapy, please continue, no need for medication based on today's score  Fasting lipid, cmp and EGFr, calcium, PTH , HBA1C, vit D in Feb before visit  You need to reduce cheese and fried foods. Bad cholesterol is high, and you are on maximum dose of current cholesterol  Please try to get into regualr exercise  Please cut back portion size to help with weight loss.  Based on today's exam you do not need a handicap sticker thankfully

## 2012-12-26 NOTE — Progress Notes (Signed)
  Subjective:    Patient ID: Pamela Stein, female    DOB: 1955-09-10, 57 y.o.   MRN: [id number]  HPI  The PT is here for follow up and re-evaluation of chronic medical conditions, medication management and review of any available recent lab and radiology data.  Preventive health is updated, specifically  Cancer screening and Immunization.   Questions or concerns regarding consultations or procedures which the PT has had in the interim are  Addressed.Reports great benefit form psychotherapy, and shje intends to continue to follow through on this The PT denies any adverse reactions to current medications since the last visit.  There are no new concerns.  There are no specific complaints       Review of Systems See HPI Denies recent fever or chills. Denies sinus pressure, nasal congestion, ear pain or sore throat. Denies chest congestion, productive cough or wheezing. Denies chest pains, palpitations and leg swelling Denies abdominal pain, nausea, vomiting,diarrhea or constipation.   Denies dysuria, frequency, hesitancy or incontinence. Denies joint pain, swelling and limitation in mobility. Denies headaches, seizures, numbness, or tingling. Denies depression, anxiety or insomnia. Denies skin break down or rash.         Objective:   Physical Exam Patient alert and oriented and in no cardiopulmonary distress.  HEENT: No facial asymmetry, EOMI, no sinus tenderness,  oropharynx pink and moist.  Neck supple no adenopathy.  Chest: Clear to auscultation bilaterally.  CVS: S1, S2 no murmurs, no S3.  ABD: Soft non tender. Bowel sounds normal.  Ext: No edema  MS: Adequate ROM spine, shoulders, hips and knees.  Skin: Intact, no ulcerations or rash noted.  Psych: Good eye contact, normal affect. Memory intact not anxious or depressed appearing.  CNS: CN 2-12 intact, power, tone and sensation normal throughout.        Assessment & Plan:

## 2012-12-28 ENCOUNTER — Ambulatory Visit (INDEPENDENT_AMBULATORY_CARE_PROVIDER_SITE_OTHER): Payer: Federal, State, Local not specified - PPO | Admitting: Psychiatry

## 2012-12-28 DIAGNOSIS — F4323 Adjustment disorder with mixed anxiety and depressed mood: Secondary | ICD-10-CM

## 2012-12-28 NOTE — Progress Notes (Signed)
Patient:  Pamela Stein   DOB: 08/21/1955  MR Number: [id number]  [facility]  [address] [place] [place],  [place], [address]  Start: Wednesday 12/28/2012 10:15 AM End: Wednesday 12/28/2012 10:50 AM  Provider/Observer:     Florencia Reasons, MSW, LCSW   Chief Complaint:      Chief Complaint  Patient presents with  . Anxiety  . Depression    Reason For Service:     Patient is referred for services by primary care physician Dr. Syliva Overman due to patient experiencing symptoms of depression. Per patient's report , she is experiencing injustice and prejudice on her job with the [employer] where she has been employed for 20 years. Patient began applying for supervisory jobs in 2008 but has never gotten a promotion. 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 About 3 or 4 months ago, another supervisory position was filled. Patient did not apply for the job as she states she knew she would not get it and Conservation officer, nature hired a friend for the position. Since that time, patient states not having any joy in her job, not wanting to go to work, and not being happy. Patient states she normally likes to interact with people and that she normally smiles. Patient states she now feels like she just isn't good enough. Patient has filed an EEO complaint. Patient is seen for follow up appointment.   Interventions Strategy:  Supportive therapy, psychoeducation  Participation Level:   Active  Participation Quality:  Appropriate      Behavioral Observation:  Casual, Alert, and Appropriate, pleasant  Current Psychosocial Factors: Patient's reports aunt recently fell and broke her hip  Content of Session:   Reviewing symptoms, processing feelings, reinforcing patient's efforts to set and maintain boundaries  Current Status:   Patient reports improved   mood, improved sleep pattern, and increased motivation. She has experienced worry regarding her aunt.  Patient Progress:   Patient reports her 60 year old aunt who lives in [place] fell and broke her hip. Patient reports she had become worried about her aunt after not being able to contact her and states driving to [place] to her home. She was not able to get into the house when she arrived but called EMS. A paramedic found patient's aunt who had been laying on the floor for over 14 hours unable to get up. Patient is relieved that and has had surgery and is recovering well. She reports having some difficulty with one of her cousins who has power of attorney regarding aunt's pocketbook but was able to be assertive and waited to release pocketbook to aunt as aunt had a previous experience where she was taken advantage of financially. Patient reports she has resumed contact with the man she has been dating intermittently for 30 years but has redefined the way she approaches the relationship. She also recently went on a date with another man and reports enjoying this. Patient verbalizes statements in session today that reflect improved self acceptance and self-worth. Therapist reinforces patient's efforts and use of affirmations.    Target Goals:   1. Improve self-care: 1:1 psychotherapy one time every one to 4 weeks (supportive, CBT)    2. Increase self acceptance: 1:1 psychotherapy one time every one to 4 weeks (supportive, CBT)  Last Reviewed:   12/16/2012  Goals Addressed Today:    1,2  Impression/Diagnosis:  The patient presents with symptoms of anxiety and depression that began about 2 or 3 months ago after a supervisory position was filled in patient's department. Patient did not apply for the position but has applied for supervisory positions since 2008 and has not been promoted. Patient reports feeling like a victim of injustice and prejudice as her manager has chosen males, friends, and  relatives to fill the positions per her report. Her current symptoms include depressed mood, anxiety, insomnia, memory difficulty, low energy, and poor motivation. Patient reports no previous episodes of depression or mania. Diagnosis: Adjustment disorder with mixed anxiety and depressed mood   Diagnosis:  Axis I: Adjustment disorder with mixed anxiety and depressed mood          Axis II: Deferred

## 2012-12-28 NOTE — Patient Instructions (Signed)
Discussed orally 

## 2013-01-01 NOTE — Assessment & Plan Note (Signed)
Mrked improvement , continue current DOSE OF  med and also therpay

## 2013-01-01 NOTE — Assessment & Plan Note (Signed)
Controlled, no change in medication Patient advised to reduce carb and sweets, commit to regular physical activity, take meds as prescribed, test blood as directed, and attempt to lose weight, to improve blood sugar control.  

## 2013-01-01 NOTE — Assessment & Plan Note (Signed)
Controlled, no change in medication DASH diet and commitment to daily physical activity for a minimum of 30 minutes discussed and encouraged, as a part of hypertension management. The importance of attaining a healthy weight is also discussed.  

## 2013-01-01 NOTE — Assessment & Plan Note (Signed)
Uncontrolled. Hyperlipidemia:Low fat diet discussed and encouraged.  Updated lab next visit 

## 2013-01-01 NOTE — Assessment & Plan Note (Signed)
Improved. Pt applauded on succesful weight loss through lifestyle change, and encouraged to continue same. Weight loss goal set for the next several months.  

## 2013-01-11 ENCOUNTER — Ambulatory Visit: Payer: Self-pay | Admitting: Family Medicine

## 2013-01-19 ENCOUNTER — Ambulatory Visit (HOSPITAL_COMMUNITY): Payer: Self-pay | Admitting: Psychiatry

## 2013-01-24 ENCOUNTER — Ambulatory Visit (HOSPITAL_COMMUNITY): Payer: Self-pay | Admitting: Psychiatry

## 2013-01-31 ENCOUNTER — Ambulatory Visit (INDEPENDENT_AMBULATORY_CARE_PROVIDER_SITE_OTHER): Payer: Federal, State, Local not specified - PPO | Admitting: Psychiatry

## 2013-01-31 DIAGNOSIS — F4323 Adjustment disorder with mixed anxiety and depressed mood: Secondary | ICD-10-CM

## 2013-01-31 NOTE — Progress Notes (Signed)
Patient:  Pamela Stein   DOB: Sep 27, 1955  MR Number: [id number]  [facility]:  [address] [place],  [place], [address]  Start: Tuesday 01/31/2013 2:05 PM End: Tuesday 01/31/2013 2:55 PM  Provider/Observer:     Florencia Reasons, MSW, LCSW   Chief Complaint:      Chief Complaint  Patient presents with  . Stress    Reason For Service:     Patient is referred for services by primary care physician Dr. Syliva Overman due to patient experiencing symptoms of depression. Per patient's report , she is experiencing injustice and prejudice on her job with the [employer] where she has been employed for 20 years. Patient began applying for supervisory jobs in 2008 but has never gotten a promotion. 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 About 3 or 4 months ago, another supervisory position was filled. Patient did not apply for the job as she states she knew she would not get it and Conservation officer, nature hired a friend for the position. Since that time, patient states not having any joy in her job, not wanting to go to work, and not being happy. Patient states she normally likes to interact with people and that she normally smiles. Patient states she now feels like she just isn't good enough. Patient has filed an EEO complaint. Patient is seen for follow up appointment.   Interventions Strategy:  Supportive therapy, psychoeducation  Participation Level:   Active  Participation Quality:  Appropriate      Behavioral Observation:  Casual, Alert, and Appropriate, pleasant  Current Psychosocial Factors:   Content of Session:   Reviewing symptoms, discussing progress, termination  Current Status:   Patient reports positive mood, improved sleep pattern, and increased motivation.   Patient Progress:   Patient reports doing well since last session.  She is very pleasant and talkative today. She has been doing well at work and being in supportive to her coworkers. Patient states no longer worrying about her job in the way she has been treated. She states feeling like her self. She has improved efforts regarding self-care. She has resumed normal interest in activities. She continues to sing in 203 musical groups including her church choir. She also reports recently enjoying sponsoring  a family for Christmas. Patient states feeling better about self. Her statements in session reflect increased self acceptance. Patient and therapist agreed to terminate services at this time as patient has accomplished her goals. She is encouraged to call this practice should she need services in the future.   Target Goals:   1. Improve self-care: 1:1 psychotherapy one time every one to 4 weeks (supportive, CBT)    2. Increase self acceptance: 1:1 psychotherapy one time every one to 4 weeks (supportive, CBT)  Last Reviewed:   01/31/2013  Goals Addressed Today:    1,2  Impression/Diagnosis:   The patient presents with symptoms of anxiety and depression that began about 2 or 3 months ago after a supervisory position was filled in patient's department. Patient did not apply for the position but has applied for supervisory positions since 2008 and has not been promoted. Patient reports feeling like a victim of injustice and prejudice as her manager has chosen males, friends, and relatives to fill the positions per her report. Her current symptoms include depressed mood, anxiety, insomnia, memory difficulty, low energy, and  poor motivation. Patient reports no previous episodes of depression or mania. Diagnosis: Adjustment disorder with mixed anxiety and depressed mood   Diagnosis:  Axis I: Adjustment disorder with mixed anxiety and depressed mood          Axis II: Deferred                 Outpatient Therapist Discharge Summary  BERNARDINA CACHO     14-Dec-1955    Admission Date: 11/21/2012  Discharge Date: 01/31/2013  Reason for Discharge:  Treatment completed  Diagnosis:  Axis I:  Adjustment disorder with mixed anxiety and depressed mood    Axis V:  81-90   Comments:    Renesmee Raine LCSW

## 2013-01-31 NOTE — Patient Instructions (Signed)
Discussed orally 

## 2013-03-28 ENCOUNTER — Ambulatory Visit: Payer: Federal, State, Local not specified - PPO | Admitting: Family Medicine

## 2013-04-08 ENCOUNTER — Other Ambulatory Visit: Payer: Self-pay | Admitting: Family Medicine

## 2013-04-12 ENCOUNTER — Ambulatory Visit: Payer: Federal, State, Local not specified - PPO | Admitting: Family Medicine

## 2013-05-05 ENCOUNTER — Other Ambulatory Visit: Payer: Self-pay | Admitting: Family Medicine

## 2013-05-05 LAB — HEMOGLOBIN A1C
Hgb A1c MFr Bld: 6.7 % — ABNORMAL HIGH (ref <5.7)
Mean Plasma Glucose: 146 mg/dL — ABNORMAL HIGH (ref <117)

## 2013-05-06 LAB — COMPLETE METABOLIC PANEL WITH GFR
ALT: 13 U/L (ref 0–35)
AST: 15 U/L (ref 0–37)
Albumin: 4 g/dL (ref 3.5–5.2)
Alkaline Phosphatase: 77 U/L (ref 39–117)
BUN: 17 mg/dL (ref 6–23)
CO2: 29 mEq/L (ref 19–32)
Calcium: 10.4 mg/dL (ref 8.4–10.5)
Chloride: 103 mEq/L (ref 96–112)
Creat: 1.05 mg/dL (ref 0.50–1.10)
GFR, Est African American: 67 mL/min
GFR, Est Non African American: 58 mL/min — ABNORMAL LOW
Glucose, Bld: 94 mg/dL (ref 70–99)
Potassium: 4.4 mEq/L (ref 3.5–5.3)
Sodium: 140 mEq/L (ref 135–145)
Total Bilirubin: 0.3 mg/dL (ref 0.2–1.2)
Total Protein: 7.5 g/dL (ref 6.0–8.3)

## 2013-05-06 LAB — LIPID PANEL
Cholesterol: 182 mg/dL (ref 0–200)
HDL: 40 mg/dL (ref >39)
LDL Cholesterol: 119 mg/dL — ABNORMAL HIGH (ref 0–99)
Total CHOL/HDL Ratio: 4.6 Ratio
Triglycerides: 116 mg/dL (ref <150)
VLDL: 23 mg/dL (ref 0–40)

## 2013-05-06 LAB — VITAMIN D 25 HYDROXY (VIT D DEFICIENCY, FRACTURES): Vit D, 25-Hydroxy: 29 ng/mL — ABNORMAL LOW (ref 30–89)

## 2013-05-08 ENCOUNTER — Encounter: Payer: Self-pay | Admitting: Family Medicine

## 2013-05-08 ENCOUNTER — Ambulatory Visit (INDEPENDENT_AMBULATORY_CARE_PROVIDER_SITE_OTHER): Payer: Federal, State, Local not specified - PPO | Admitting: Family Medicine

## 2013-05-08 ENCOUNTER — Ambulatory Visit: Payer: Federal, State, Local not specified - PPO | Admitting: Family Medicine

## 2013-05-08 ENCOUNTER — Encounter (INDEPENDENT_AMBULATORY_CARE_PROVIDER_SITE_OTHER): Payer: Self-pay

## 2013-05-08 VITALS — BP 134/82 | HR 86 | Resp 18 | Ht 62.0 in | Wt 216.0 lb

## 2013-05-08 DIAGNOSIS — M79609 Pain in unspecified limb: Secondary | ICD-10-CM

## 2013-05-08 DIAGNOSIS — R7989 Other specified abnormal findings of blood chemistry: Secondary | ICD-10-CM

## 2013-05-08 DIAGNOSIS — E790 Hyperuricemia without signs of inflammatory arthritis and tophaceous disease: Secondary | ICD-10-CM | POA: Insufficient documentation

## 2013-05-08 DIAGNOSIS — M79674 Pain in right toe(s): Secondary | ICD-10-CM

## 2013-05-08 DIAGNOSIS — E669 Obesity, unspecified: Secondary | ICD-10-CM

## 2013-05-08 DIAGNOSIS — I10 Essential (primary) hypertension: Secondary | ICD-10-CM

## 2013-05-08 DIAGNOSIS — G4733 Obstructive sleep apnea (adult) (pediatric): Secondary | ICD-10-CM

## 2013-05-08 DIAGNOSIS — E119 Type 2 diabetes mellitus without complications: Secondary | ICD-10-CM

## 2013-05-08 LAB — PTH, INTACT AND CALCIUM
Calcium: 10.4 mg/dL (ref 8.4–10.5)
PTH: 188.7 pg/mL — ABNORMAL HIGH (ref 14.0–72.0)

## 2013-05-08 LAB — URIC ACID: Uric Acid, Serum: 10.4 mg/dL — ABNORMAL HIGH (ref 2.4–7.0)

## 2013-05-08 MED ORDER — INDOMETHACIN 50 MG PO CAPS: ORAL_CAPSULE | ORAL | Status: DC

## 2013-05-08 MED ORDER — PREDNISONE (PAK) 5 MG PO TABS
5.0000 mg | ORAL_TABLET | ORAL | Status: DC
Start: 2013-05-08 — End: 2013-07-25

## 2013-05-08 MED ORDER — KETOROLAC TROMETHAMINE 60 MG/2ML IM SOLN: 60.0000 mg | Freq: Once | INTRAMUSCULAR | Status: DC

## 2013-05-08 MED ORDER — METFORMIN HCL 500 MG PO TABS: 500.0000 mg | ORAL_TABLET | Freq: Two times a day (BID) | ORAL | Status: DC

## 2013-05-08 MED ORDER — METHYLPREDNISOLONE ACETATE 80 MG/ML IJ SUSP: 80.0000 mg | Freq: Once | INTRAMUSCULAR | Status: DC

## 2013-05-08 NOTE — Assessment & Plan Note (Signed)
Pt reminded of the need to use her CPAP machine on a daily basis for maximum benefit

## 2013-05-08 NOTE — Patient Instructions (Addendum)
F/u in 4 month, call if you nedd me befopre  Blood pressure is excellent, blood sugar improved , also cholesterol.  You STILL need to reduce fried and fatty foods, butter, cheeses and egg yolk to get "bad cholesterol " under 100, no med change at this time  Script changed for metformin 568m one twice daily, so you do not have to break the 10020mtablet in half as you have been doing  Injections in the office for toe pain and medication also sent in. PLEASE call next week Monday or Tuesday if persists for referral to podiatrist of your choice. Start the indomethacin and prednisone tomorrow  Non fasting hBA1C, chem 7 and EGFR  Before next visit

## 2013-05-08 NOTE — Assessment & Plan Note (Signed)
Acute pain and swelling recurrent affecting right great toe, previous xray shows arthritis. Toradol and depo medrol i office , followed by short course of indomethacin and prednisone. Uric acid checked and is elevated, she is to start allopurinol next week. May need to d/c maxzide , needs info on dit to reduce gout flares also

## 2013-05-08 NOTE — Assessment & Plan Note (Signed)
Allopurinol to be started in 1 week, rept level i 3 to 4 monht, consider change in BP meds

## 2013-05-08 NOTE — Assessment & Plan Note (Signed)
Improved without medication, therapy very beneficial

## 2013-05-08 NOTE — Assessment & Plan Note (Signed)
Deteriorated. Patient re-educated about  the importance of commitment to a  minimum of 150 minutes of exercise per week. The importance of healthy food choices with portion control discussed. Encouraged to start a food diary, count calories and to consider  joining a support group. Sample diet sheets offered. Goals set by the patient for the next several months.    

## 2013-05-08 NOTE — Progress Notes (Signed)
Subjective:    Patient ID: Pamela Stein, female    DOB: 07/14/1955, 58 y.o.   MRN: [id number]  HPI The PT is here for follow up and re-evaluation of chronic medical conditions, medication management and review of any available recent lab and radiology data.  Preventive health is updated, specifically  Cancer screening and Immunization.    The PT denies any adverse reactions to current medications since the last visit.  2 week h/o acute pain and swelling of right great toe, which she states started after a change in foot wear. No h/o any other direct foot trauma, had similar episodes of acute pain and swelling same joint approx 6 months ago, at that time uric acid level was normal Depression resolved with counseling Denies polyuria, polydipsia or blurred vision      Review of Systems See HPI Denies recent fever or chills. Denies sinus pressure, nasal congestion, ear pain or sore throat. Denies chest congestion, productive cough or wheezing. Denies chest pains, palpitations and leg swelling Denies abdominal pain, nausea, vomiting,diarrhea or constipation.   Denies dysuria, frequency, hesitancy or incontinence. Denies headaches, seizures, numbness, or tingling.  Denies skin break down or rash.        Objective:   Physical Exam  BP 134/82  Pulse 86  Resp 18  Ht 5\' 2"  (1.575 m)  Wt 216 lb (97.977 kg)  BMI 39.50 kg/m2  SpO2 99% Patient alert and oriented and in no cardiopulmonary distress.  HEENT: No facial asymmetry, EOMI, no sinus tenderness,  oropharynx pink and moist.  Neck supple no adenopathy.  Chest: Clear to auscultation bilaterally.  CVS: S1, S2 no murmurs, no S3.  ABD: Soft non tender. Bowel sounds normal.  Ext: No edema  MS: Adequate ROM spine, shoulders, hips and knees.DSwrelling erythema , warmth and tenderness of MP joint of right great toe  Skin: Intact, no ulcerations or rash noted.  Psych: Good eye contact, normal affect. Memory intact not  anxious or depressed appearing.  CNS: CN 2-12 intact, power, tone and sensation normal throughout.       Assessment & Plan:  Depression Improved without medication, therapy very beneficial  Toe pain, right Acute pain and swelling recurrent affecting right great toe, previous xray shows arthritis. Toradol and depo medrol i office , followed by short course of indomethacin and prednisone. Uric acid checked and is elevated, she is to start allopurinol next week. May need to d/c maxzide , needs info on dit to reduce gout flares also  Hyperuricemia Allopurinol to be started in 1 week, rept level i 3 to 4 monht, consider change in BP meds  Diabetes mellitus Improved, pt congratulated on this  Patient advised to reduce carb and sweets, commit to regular physical activity, take meds as prescribed, test blood as directed, and attempt to lose weight, to improve blood sugar control.   HYPERTENSION Controlled, may need to change from maxzide due to hyperuricemia. Increased vegetable and fruit intake and reduced sodium intake encouraged also daily exercise and weight loss  OBSTRUCTIVE SLEEP APNEA Pt reminded of the need to use her CPAP machine on a daily basis for maximum benefit  OBESITY Deteriorated. Patient re-educated about  the importance of commitment to a  minimum of 150 minutes of exercise per week. The importance of healthy food choices with portion control discussed. Encouraged to start a food diary, count calories and to consider  joining a support group. Sample diet sheets offered. Goals set by the patient for the next  several months.

## 2013-05-08 NOTE — Assessment & Plan Note (Signed)
Controlled, may need to change from maxzide due to hyperuricemia. Increased vegetable and fruit intake and reduced sodium intake encouraged also daily exercise and weight loss

## 2013-05-08 NOTE — Assessment & Plan Note (Signed)
Improved, pt congratulated on this  Patient advised to reduce carb and sweets, commit to regular physical activity, take meds as prescribed, test blood as directed, and attempt to lose weight, to improve blood sugar control.

## 2013-05-10 ENCOUNTER — Other Ambulatory Visit: Payer: Self-pay

## 2013-05-10 DIAGNOSIS — E790 Hyperuricemia without signs of inflammatory arthritis and tophaceous disease: Secondary | ICD-10-CM

## 2013-05-10 MED ORDER — ALLOPURINOL 300 MG PO TABS: 300.0000 mg | ORAL_TABLET | Freq: Every day | ORAL | Status: DC

## 2013-05-18 MED ORDER — KETOROLAC TROMETHAMINE 60 MG/2ML IM SOLN
60.0000 mg | Freq: Once | INTRAMUSCULAR | Status: AC
  Administered 2013-05-18: 60 mg via INTRAMUSCULAR

## 2013-05-18 MED ORDER — METHYLPREDNISOLONE ACETATE 80 MG/ML IJ SUSP
80.0000 mg | Freq: Once | INTRAMUSCULAR | Status: AC
  Administered 2013-05-18: 80 mg via INTRAMUSCULAR

## 2013-05-18 NOTE — Addendum Note (Signed)
Addended by: Denman George B on: 05/18/2013 03:53 PM   Modules accepted: Orders

## 2013-06-13 ENCOUNTER — Telehealth: Payer: Self-pay | Admitting: Family Medicine

## 2013-06-13 DIAGNOSIS — M79673 Pain in unspecified foot: Secondary | ICD-10-CM

## 2013-06-14 NOTE — Telephone Encounter (Signed)
Yes, but pls ensure you know what the complaint is when referring, so referral entered acurately, she is a diabetic also

## 2013-06-14 NOTE — Telephone Encounter (Signed)
Ok to refer to Dr Doran Durand in [place]? States you told her to call back for referral.

## 2013-06-16 NOTE — Addendum Note (Signed)
Addended by: Eual Fines on: 06/16/2013 11:33 AM   Modules accepted: Orders

## 2013-07-10 LAB — HM DIABETES EYE EXAM

## 2013-07-11 ENCOUNTER — Other Ambulatory Visit: Payer: Self-pay | Admitting: Family Medicine

## 2013-07-11 DIAGNOSIS — Z1231 Encounter for screening mammogram for malignant neoplasm of breast: Secondary | ICD-10-CM

## 2013-07-13 ENCOUNTER — Ambulatory Visit (HOSPITAL_COMMUNITY)
Admission: RE | Admit: 2013-07-13 | Discharge: 2013-07-13 | Disposition: A | Discharge status: Home / Self Care | Payer: Federal, State, Local not specified - PPO | Source: Ambulatory Visit | Attending: Family Medicine | Admitting: Family Medicine

## 2013-07-13 DIAGNOSIS — Z1231 Encounter for screening mammogram for malignant neoplasm of breast: Secondary | ICD-10-CM

## 2013-07-13 IMAGING — MG MM DIGITAL SCREENING
4 series · 4 of 4 positions shown · non-contrast
Comparison: Previous exam(s)

ACR Breast Density Category a: The breast tissue is almost entirely
fatty.

CLINICAL DATA: Screening.

EXAM:
DIGITAL SCREENING BILATERAL MAMMOGRAM WITH CAD

[L CC]
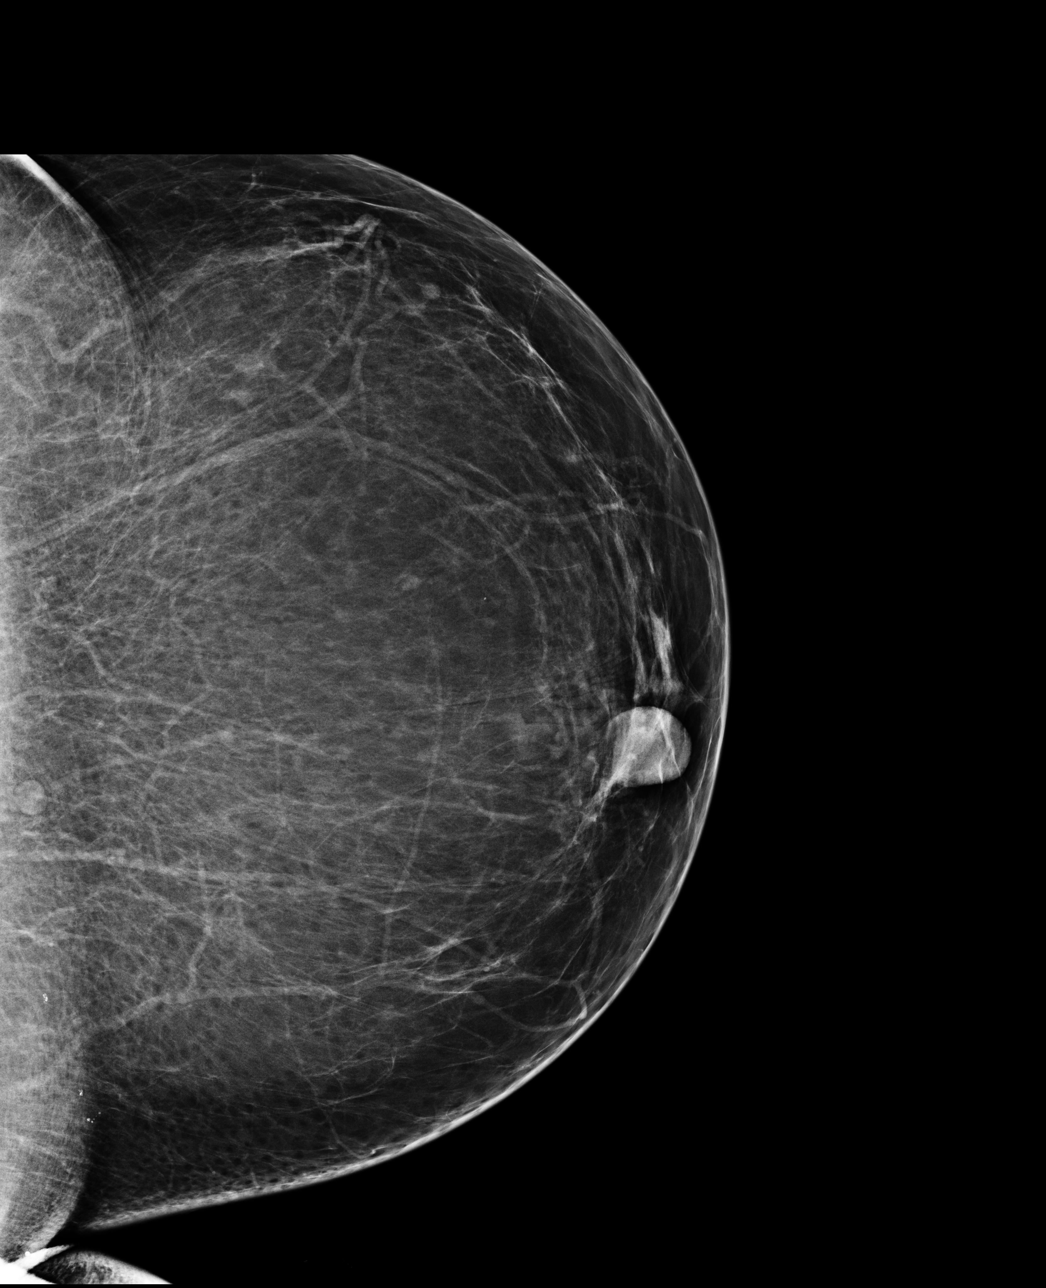

[L MLO]
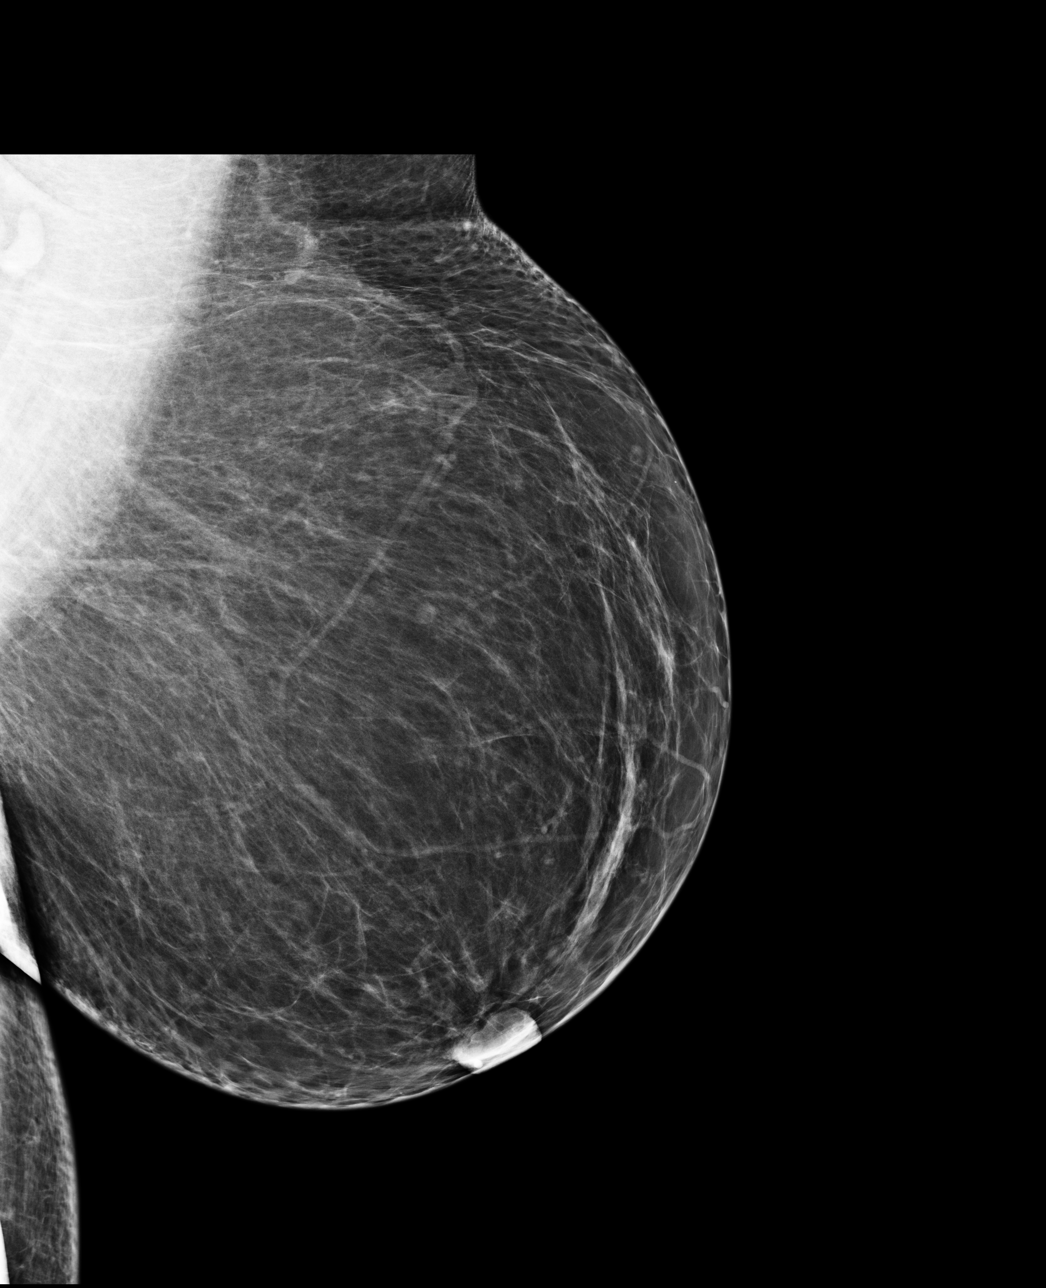

[R CC]
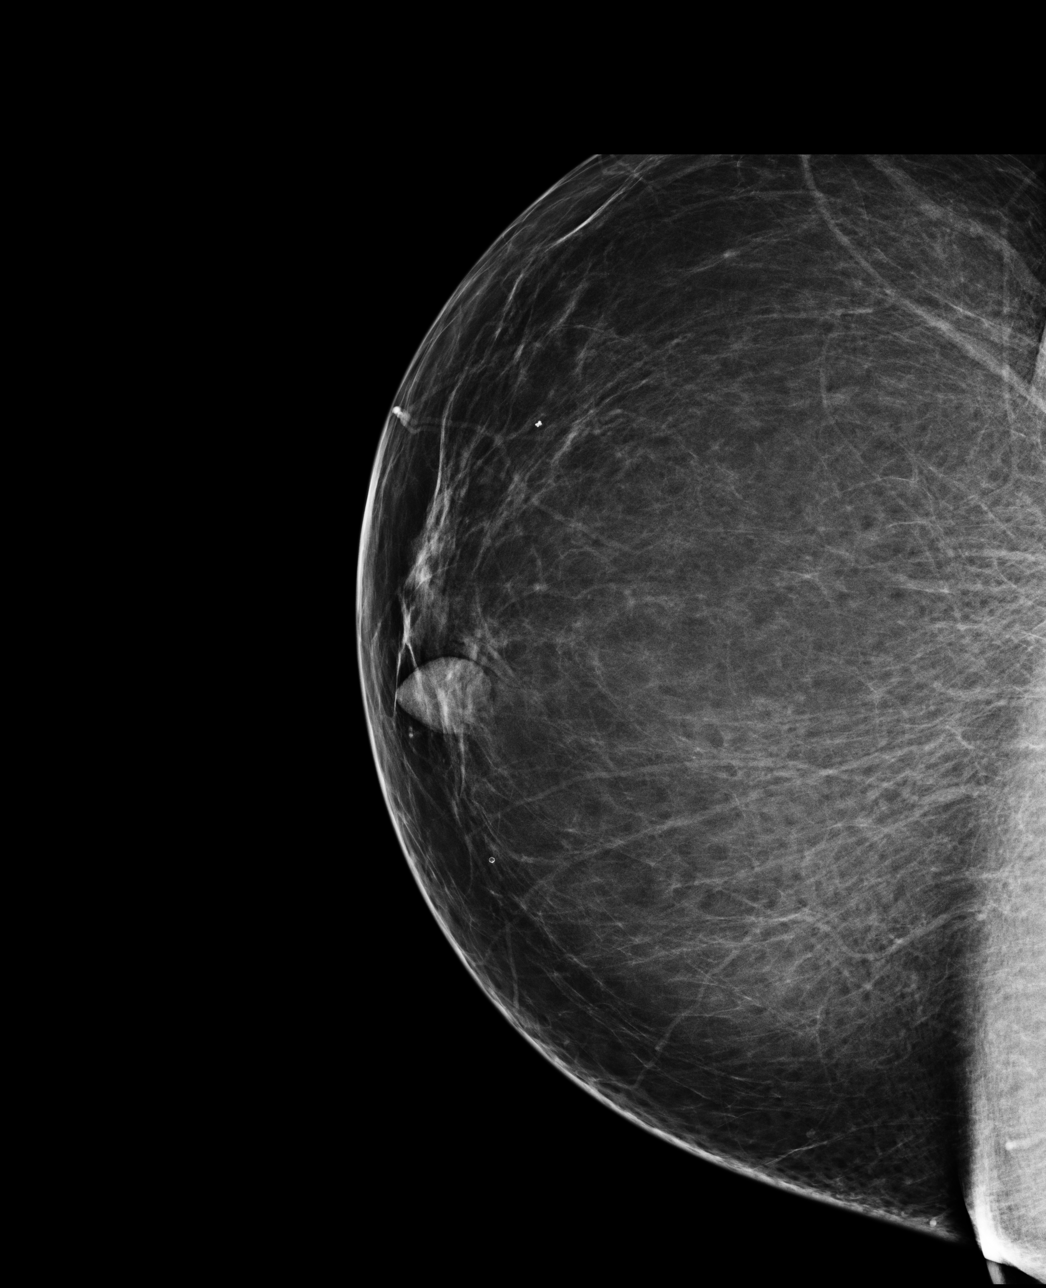

[R MLO]
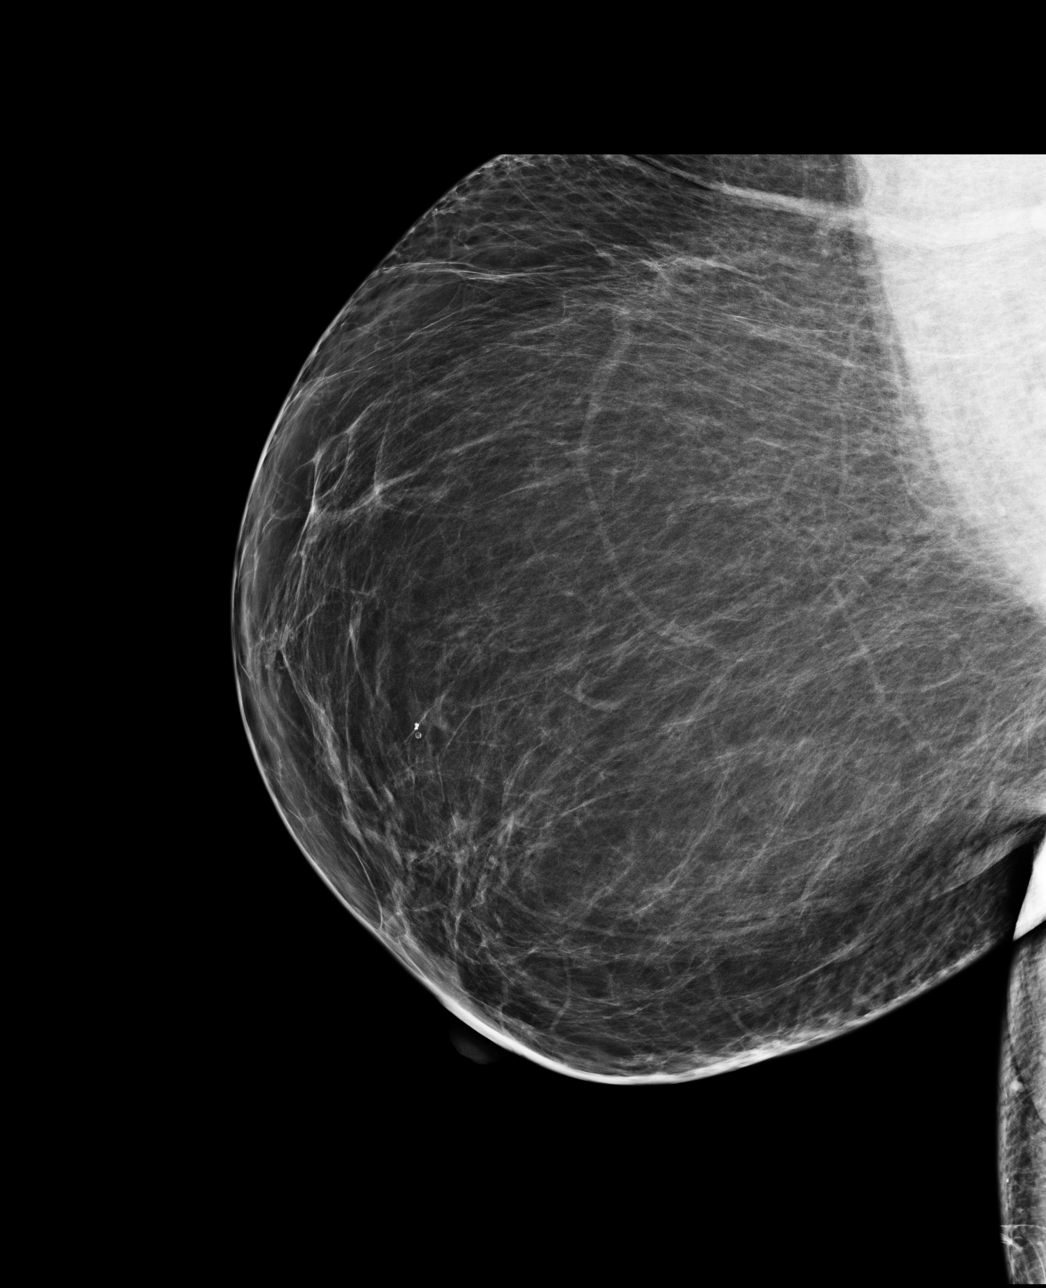

[4 of 4 positions shown; findings below may reference images not displayed]

FINDINGS: There are no findings suspicious for malignancy. Images were
processed with CAD.
IMPRESSION: No mammographic evidence of malignancy. A result letter of this
screening mammogram will be mailed directly to the patient.

RECOMMENDATION:
Screening mammogram in one year. (Code:[0Q])

BI-RADS CATEGORY  1: Negative.

## 2013-07-24 ENCOUNTER — Telehealth: Payer: Self-pay | Admitting: Family Medicine

## 2013-07-24 NOTE — Telephone Encounter (Signed)
Has been coughing x 1.5 weeks. Thick yellow mucus. Cough deep and forceful- she sometimes wets herself. States she gets bronchitis every year

## 2013-07-24 NOTE — Telephone Encounter (Signed)
Pt scheduled for appt tomorrow.  

## 2013-07-25 ENCOUNTER — Ambulatory Visit (INDEPENDENT_AMBULATORY_CARE_PROVIDER_SITE_OTHER): Payer: Federal, State, Local not specified - PPO | Admitting: Family Medicine

## 2013-07-25 ENCOUNTER — Ambulatory Visit (HOSPITAL_COMMUNITY)
Admission: RE | Admit: 2013-07-25 | Discharge: 2013-07-25 | Disposition: A | Discharge status: Home / Self Care | Payer: Federal, State, Local not specified - PPO | Source: Ambulatory Visit | Attending: Family Medicine | Admitting: Family Medicine

## 2013-07-25 ENCOUNTER — Encounter: Payer: Self-pay | Admitting: Family Medicine

## 2013-07-25 VITALS — BP 136/80 | HR 96 | Temp 98.9°F | Resp 20 | Wt 221.0 lb

## 2013-07-25 DIAGNOSIS — J209 Acute bronchitis, unspecified: Secondary | ICD-10-CM

## 2013-07-25 DIAGNOSIS — R509 Fever, unspecified: Secondary | ICD-10-CM | POA: Insufficient documentation

## 2013-07-25 DIAGNOSIS — R05 Cough: Secondary | ICD-10-CM | POA: Insufficient documentation

## 2013-07-25 DIAGNOSIS — E119 Type 2 diabetes mellitus without complications: Secondary | ICD-10-CM

## 2013-07-25 DIAGNOSIS — J0100 Acute maxillary sinusitis, unspecified: Secondary | ICD-10-CM

## 2013-07-25 DIAGNOSIS — R0989 Other specified symptoms and signs involving the circulatory and respiratory systems: Secondary | ICD-10-CM | POA: Insufficient documentation

## 2013-07-25 DIAGNOSIS — R059 Cough, unspecified: Secondary | ICD-10-CM | POA: Insufficient documentation

## 2013-07-25 DIAGNOSIS — I10 Essential (primary) hypertension: Secondary | ICD-10-CM

## 2013-07-25 IMAGING — CR DG CHEST 2V
2 series · 2 of 2 positions shown · non-contrast
Comparison: [DATE]

CLINICAL DATA: Cough and congestion.  Fever for 2 weeks.

EXAM:
CHEST  2 VIEW

[view not recorded (1 of 2)]
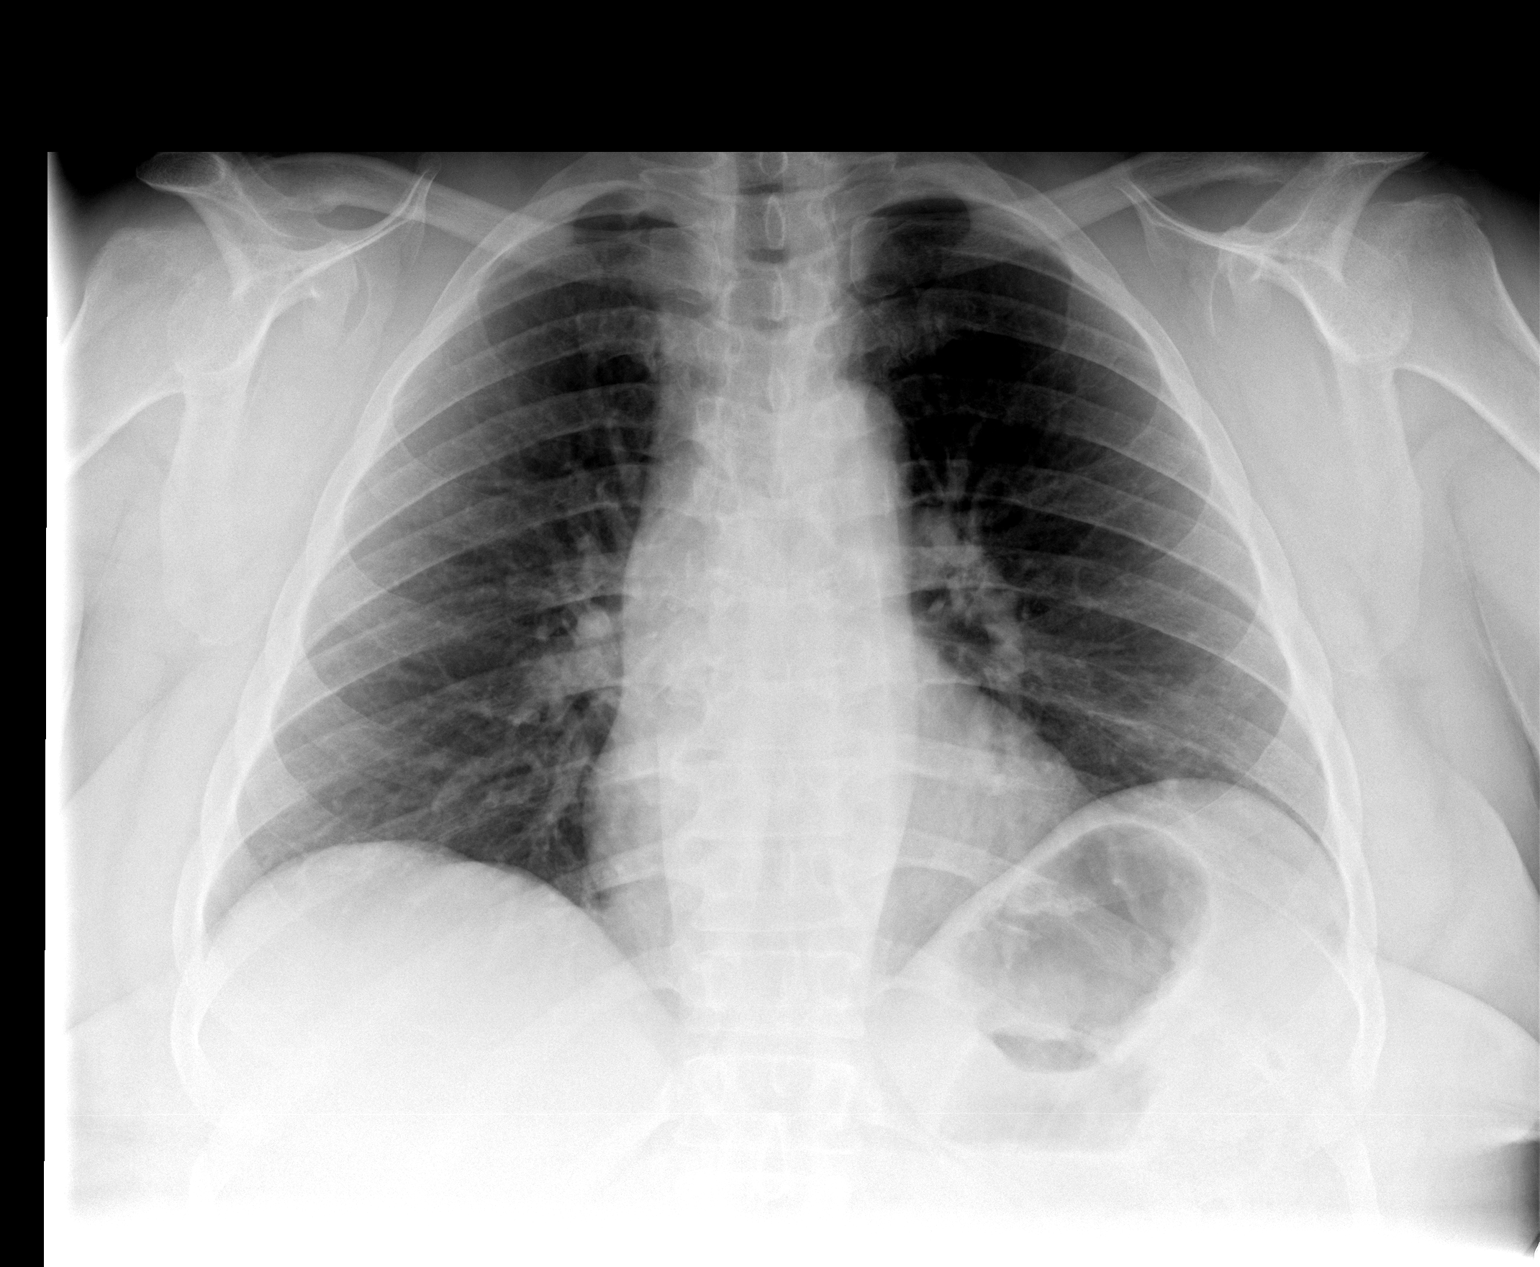

[view not recorded (2 of 2)]
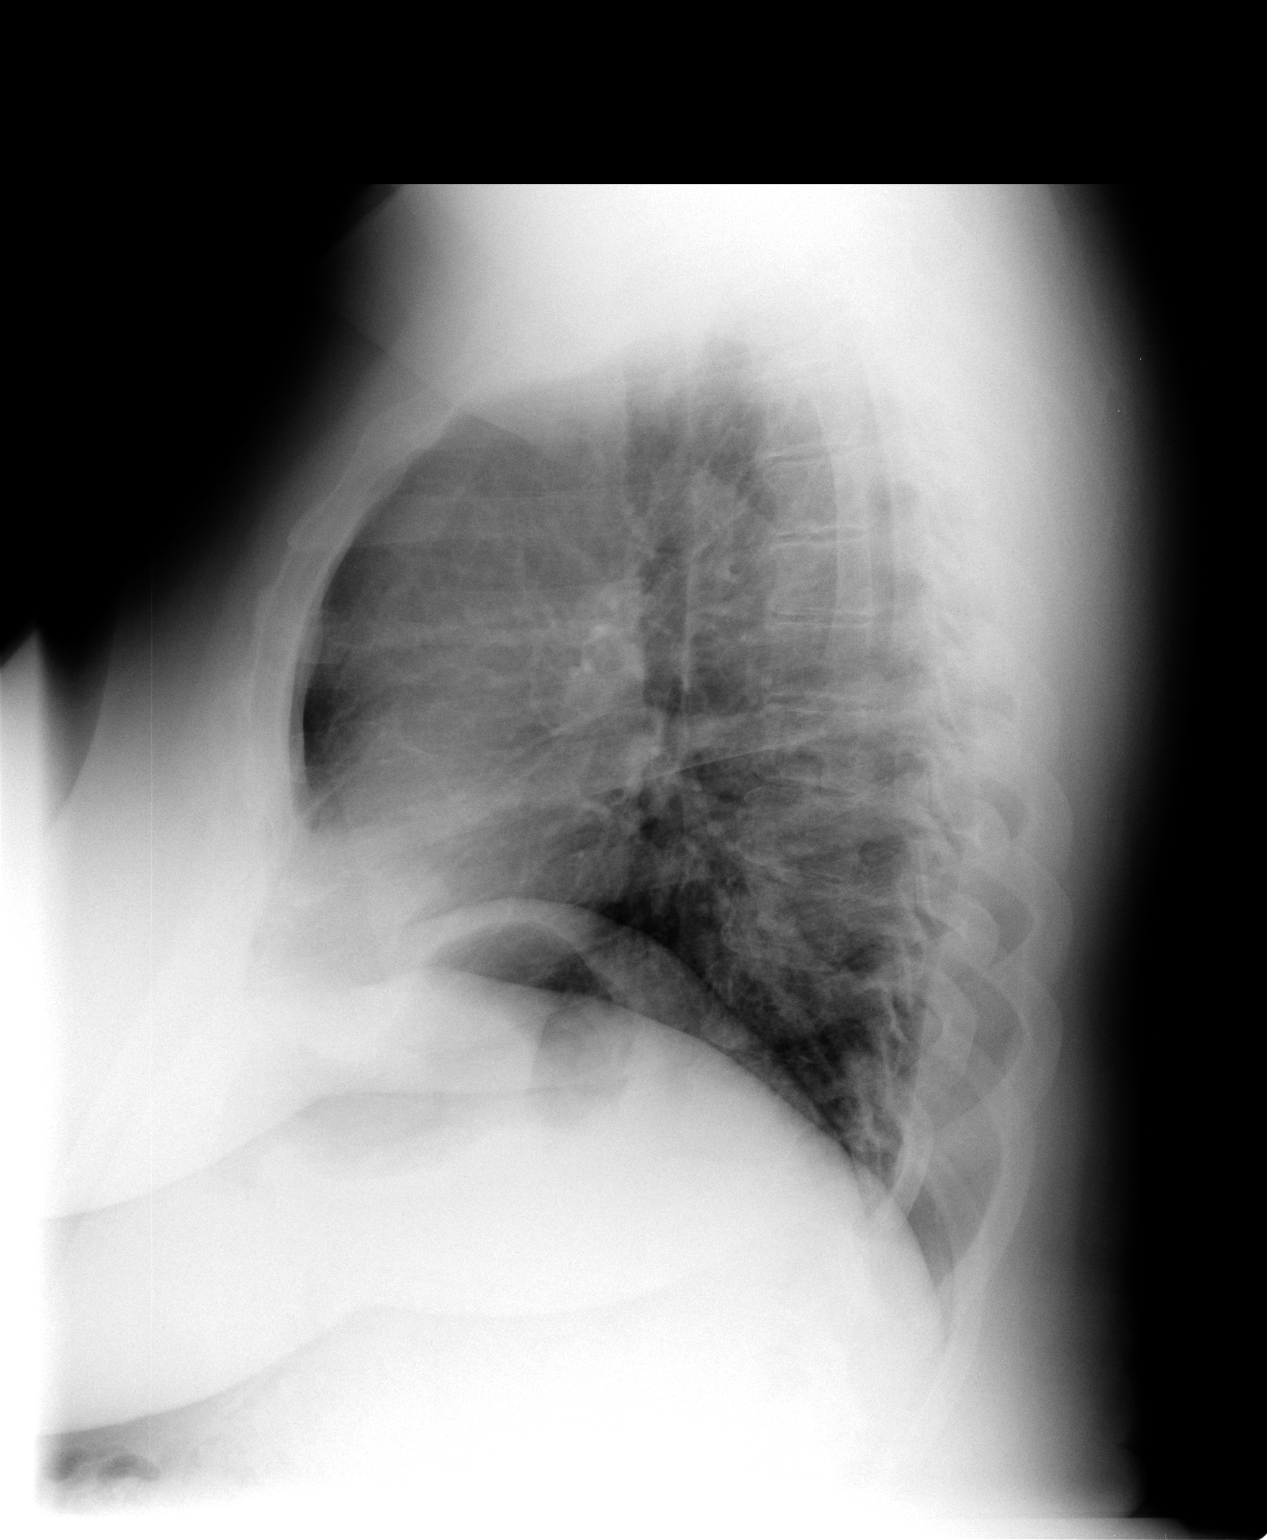

[2 of 2 positions shown; findings below may reference images not displayed]

FINDINGS: The heart size and mediastinal contours are within normal limits.
Both lungs are clear. The visualized skeletal structures are
unremarkable.
IMPRESSION: No active cardiopulmonary disease.

## 2013-07-25 MED ORDER — BENZONATATE 100 MG PO CAPS: 100.0000 mg | ORAL_CAPSULE | Freq: Three times a day (TID) | ORAL | Status: DC | PRN

## 2013-07-25 MED ORDER — IPRATROPIUM BROMIDE 0.02 % IN SOLN
0.5000 mg | Freq: Once | RESPIRATORY_TRACT | Status: AC
  Administered 2013-07-25: 0.5 mg via RESPIRATORY_TRACT

## 2013-07-25 MED ORDER — PREDNISONE 5 MG PO KIT
PACK | ORAL | Status: DC
Start: 2013-07-25 — End: 2013-09-07

## 2013-07-25 MED ORDER — ALBUTEROL SULFATE (2.5 MG/3ML) 0.083% IN NEBU
2.5000 mg | INHALATION_SOLUTION | Freq: Once | RESPIRATORY_TRACT | Status: AC
  Administered 2013-07-25: 2.5 mg via RESPIRATORY_TRACT

## 2013-07-25 MED ORDER — LEVOFLOXACIN 500 MG PO TABS: 500.0000 mg | ORAL_TABLET | Freq: Every day | ORAL | Status: DC

## 2013-07-25 MED ORDER — PROMETHAZINE-DM 6.25-15 MG/5ML PO SYRP: 5.0000 mL | ORAL_SOLUTION | Freq: Every evening | ORAL | Status: DC | PRN

## 2013-07-25 MED ORDER — ALBUTEROL SULFATE HFA 108 (90 BASE) MCG/ACT IN AERS: 2.0000 | INHALATION_SPRAY | Freq: Four times a day (QID) | RESPIRATORY_TRACT | Status: DC | PRN

## 2013-07-25 MED ORDER — CEFTRIAXONE SODIUM 1 G IJ SOLR
500.0000 mg | Freq: Once | INTRAMUSCULAR | Status: AC
  Administered 2013-07-25: 500 mg via INTRAMUSCULAR

## 2013-07-25 NOTE — Patient Instructions (Addendum)
F/u appointment as before  CXR today, I will contact you later if CXR is very abnormal and shows pneumonoia so that you will need to stay out of work then. I do not think this will be the case , so return to work in the morning unless you hear from me or you feel worse  You are treated for acute sinusitis and bronchitis  Rocephin ,  in office and breathing treatment  Medications  (5) are sent to your pharmacy today

## 2013-07-25 NOTE — Progress Notes (Signed)
   Subjective:    Patient ID: Pamela Stein, female    DOB: 1955-09-15, 58 y.o.   MRN: [id number]  HPI  10 day h/o worsening head and chest congestion, associated with fever and chills intermittently. Nasal drainage has thickened , and is yellowish green, and at times bloody. Sputum is thick and yellow. C/o bilateral ear pressure, denies hearing loss and sore throat. Increasing fatigue , poor appetitie and sleep disturbed by cough. No improvement with OTC medication.    Review of Systems See HPI Denies chest pains, palpitations and leg swelling Denies abdominal pain, nausea, vomiting,diarrhea or constipation.   Denies dysuria, frequency, hesitancy or incontinence. Denies joint pain, swelling and limitation in mobility. Denies  seizures, numbness, or tingling.Has headache due to excessive cough Denies depression, anxiety or insomnia. Denies skin break down or rash.        Objective:   Physical Exam BP 136/80  Pulse 96  Temp(Src) 98.9 F (37.2 C)  Resp 20  Wt 221 lb (100.245 kg)  SpO2 96% Patient alert and oriented and in no cardiopulmonary distress.  HEENT: No facial asymmetry, EOMI,   oropharynx pink and moist.  Neck supple no JVD, no mass. Maxillary sinus tenderness, bilateral anterior cervical adenopathy.TM clear bilaterally Chest: decreased though adequate  air entry, bilateral crackles and wheezes  CVS: S1, S2 no murmurs, no S3.  ABD: Soft non tender.   Ext: No edema  MS: Adequate ROM spine, shoulders, hips and knees.  Skin: Intact, no ulcerations or rash noted.  Psych: Good eye contact, normal affect. Memory intact not anxious or depressed appearing.  CNS: CN 2-12 intact, power,  normal throughout.no focal deficits noted.        Assessment & Plan:  Acute bronchitis Neb treatment and Rocephin  in office followed by course of antibiotics, decongestant and cough suppressant also proventil MDI  CXR today  Acute maxillary sinusitis Rocephin  administered to be followed by antibiotic course  HYPERTENSION Controlled, no change in medication   Diabetes mellitus Controlled, no change in medication Updated lab needed at/ before next visit.

## 2013-07-26 ENCOUNTER — Telehealth: Payer: Self-pay

## 2013-07-26 DIAGNOSIS — J0100 Acute maxillary sinusitis, unspecified: Secondary | ICD-10-CM | POA: Insufficient documentation

## 2013-07-26 NOTE — Telephone Encounter (Signed)
Patient aware.

## 2013-07-26 NOTE — Telephone Encounter (Signed)
Patient aware of results.  Work excuse typed and ready for signature.

## 2013-07-26 NOTE — Assessment & Plan Note (Signed)
Controlled, no change in medication  

## 2013-07-26 NOTE — Telephone Encounter (Signed)
Patient requesting that letter be faxed to [facility] [phone]

## 2013-07-26 NOTE — Telephone Encounter (Signed)
That is fine, pls let her know she does not have pneumonia but take all meds as prescribed I will sign note

## 2013-07-26 NOTE — Assessment & Plan Note (Addendum)
Neb treatment and Rocephin  in office followed by course of antibiotics, decongestant and cough suppressant also proventil MDI  CXR today

## 2013-07-26 NOTE — Assessment & Plan Note (Signed)
Rocephin administered to be followed by antibiotic course

## 2013-07-26 NOTE — Assessment & Plan Note (Signed)
Controlled, no change in medication Updated lab needed at/ before next visit.  

## 2013-09-07 ENCOUNTER — Encounter: Payer: Self-pay | Admitting: Family Medicine

## 2013-09-07 ENCOUNTER — Encounter (INDEPENDENT_AMBULATORY_CARE_PROVIDER_SITE_OTHER): Payer: Self-pay

## 2013-09-07 ENCOUNTER — Ambulatory Visit (INDEPENDENT_AMBULATORY_CARE_PROVIDER_SITE_OTHER): Payer: Federal, State, Local not specified - PPO | Admitting: Family Medicine

## 2013-09-07 VITALS — BP 158/90 | HR 90 | Resp 16 | Ht 62.0 in | Wt 229.1 lb

## 2013-09-07 DIAGNOSIS — I10 Essential (primary) hypertension: Secondary | ICD-10-CM

## 2013-09-07 DIAGNOSIS — E119 Type 2 diabetes mellitus without complications: Secondary | ICD-10-CM

## 2013-09-07 DIAGNOSIS — R7989 Other specified abnormal findings of blood chemistry: Secondary | ICD-10-CM

## 2013-09-07 DIAGNOSIS — E790 Hyperuricemia without signs of inflammatory arthritis and tophaceous disease: Secondary | ICD-10-CM

## 2013-09-07 DIAGNOSIS — R05 Cough: Secondary | ICD-10-CM

## 2013-09-07 DIAGNOSIS — R079 Chest pain, unspecified: Secondary | ICD-10-CM

## 2013-09-07 DIAGNOSIS — E785 Hyperlipidemia, unspecified: Secondary | ICD-10-CM

## 2013-09-07 DIAGNOSIS — R059 Cough, unspecified: Secondary | ICD-10-CM

## 2013-09-07 DIAGNOSIS — E669 Obesity, unspecified: Secondary | ICD-10-CM

## 2013-09-07 DIAGNOSIS — R058 Other specified cough: Secondary | ICD-10-CM

## 2013-09-07 MED ORDER — TRIAMTERENE-HCTZ 75-50 MG PO TABS: 1.0000 | ORAL_TABLET | Freq: Every day | ORAL | Status: DC

## 2013-09-07 NOTE — Assessment & Plan Note (Addendum)
Uncontrolled inc maxzide to 50mg  daily DASH diet and commitment to daily physical activity for a minimum of 30 minutes discussed and encouraged, as a part of hypertension management. The importance of attaining a healthy weight is also discussed.

## 2013-09-07 NOTE — Patient Instructions (Addendum)
F/u in 5 weeks, call if you need me befoore  You are referred to cardiology because of new chest heaviness with walking   Tylenol 2 tablets in office today for headache  Commit to daily Claritin and saline flushes of nostrils once or twice daily, this will address the cough, call for sputum cup for testing for infection if not better, pls also seriously consider use of nasal spray to manage allergies daily  Fasting lipid, cmp and egfr and hba1c and uric acid and tsh this week pls

## 2013-09-07 NOTE — Assessment & Plan Note (Addendum)
10 day h/o exertional chest pain / heaviness, relieved with rest, multipl cardiac risk factors. EKG in office and card eval due to multiple cardiac risk factors and new complaint EKG today is negative for acute ischemia, first degree block seen which is new

## 2013-09-10 NOTE — Assessment & Plan Note (Signed)
Uncontrolled Updated lab needed , past due Hyperlipidemia:Low fat diet discussed and encouraged.

## 2013-09-10 NOTE — Assessment & Plan Note (Signed)
Unchnaged. Patient re-educated about  the importance of commitment to a  minimum of 150 minutes of exercise per week. The importance of healthy food choices with portion control discussed. Encouraged to start a food diary, count calories and to consider  joining a support group. Sample diet sheets offered. Goals set by the patient for the next several months.    

## 2013-09-10 NOTE — Assessment & Plan Note (Signed)
Persistent cough due nt uncontrolled sinus allergies.Pt resistant to steroid nasal spray, will continue daily tablet and start saline nasal flushes Sputum, if seems infected to be submitted

## 2013-09-10 NOTE — Assessment & Plan Note (Signed)
Controlled, no change in medication Patient advised to reduce carb and sweets, commit to regular physical activity, take meds as prescribed, test blood as directed, and attempt to lose weight, to improve blood sugar control. Updated lab needed  Foot exam today

## 2013-09-10 NOTE — Progress Notes (Signed)
Subjective:    Patient ID: Pamela Stein, female    DOB: [birth year], 58 y.o.   MRN: [id number]  HPI The PT is here for follow up and re-evaluation of chronic medical conditions, medication management and review of any available recent lab and radiology data.  Preventive health is updated, specifically  Cancer screening and Immunization.    The PT denies any adverse reactions to current medications since the last visit.  1 week h/o new exertional chest pain which limits her activity, and is relieved by rest. Pain is non radiating , there is no accompanying nausea, light headedness or diaphoresis  Still c/o chronic cough, no fever or chills, scant sputum, chronic post nasal drainage  is present, uses tablet only for allergy control   Review of Systems See HPI Denies recent fever or chills. Denies sinus pressure, nasal congestion, ear pain or sore throat. Denies chest congestion, productive cough or wheezing. Denies PND, orthopnea, palpitations and leg swelling Denies abdominal pain, nausea, vomiting,diarrhea or constipation.   Denies dysuria, frequency, hesitancy or incontinence. Chronic joint pain and, swelling and no limitation in mobility. Denies headaches, seizures, numbness, or tingling. Denies depression, anxiety or insomnia. Denies skin break down or rash.        Objective:   Physical Exam  BP 158/90  Pulse 90  Resp 16  Ht 5\' 2"  (1.575 m)  Wt 229 lb 1.9 oz (103.928 kg)  BMI 41.90 kg/m2  SpO2 97% Patient alert and oriented and in no cardiopulmonary distress.  HEENT: No facial asymmetry, EOMI,   oropharynx pink and moist.  Neck supple no JVD, no mass. TM clear, nasal mucosa erythematous and edematous, TM clear Bilaterally Chest: Clear to auscultation bilaterally.no reproducible chest wall pain  CVS: S1, S2 no murmurs, no S3.Regular rate. EKG: Sinus ryhtm with first degree block, no ischemia noted ABD: Soft non tender.   Ext: No edema  MS: Adequate ROM  spine, shoulders, hips and knees.  Skin: Intact, no ulcerations or rash noted.  Psych: Good eye contact, normal affect. Memory intact not anxious or depressed appearing.  CNS: CN 2-12 intact, power,  normal throughout.no focal deficits noted.       Assessment & Plan:  HYPERTENSION Uncontrolled inc maxzide to 50mg  daily DASH diet and commitment to daily physical activity for a minimum of 30 minutes discussed and encouraged, as a part of hypertension management. The importance of attaining a healthy weight is also discussed.   Chest pain on exertion 10 day h/o exertional chest pain / heaviness, relieved with rest, multipl cardiac risk factors. EKG in office and card eval due to multiple cardiac risk factors and new complaint EKG today is negative for acute ischemia, first degree block seen which is new  HYPERLIPIDEMIA Uncontrolled Updated lab needed , past due Hyperlipidemia:Low fat diet discussed and encouraged.    Diabetes mellitus Controlled, no change in medication Patient advised to reduce carb and sweets, commit to regular physical activity, take meds as prescribed, test blood as directed, and attempt to lose weight, to improve blood sugar control. Updated lab needed  Foot exam today   Allergic cough Persistent cough due nt uncontrolled sinus allergies.Pt resistant to steroid nasal spray, will continue daily tablet and start saline nasal flushes Sputum, if seems infected to be submitted  OBESITY Unchnaged Patient re-educated about  the importance of commitment to a  minimum of 150 minutes of exercise per week. The importance of healthy food choices with portion control discussed. Encouraged to start a  food diary, count calories and to consider  joining a support group. Sample diet sheets offered. Goals set by the patient for the next several months.

## 2013-09-22 ENCOUNTER — Encounter: Payer: Self-pay | Admitting: Family Medicine

## 2013-09-23 LAB — COMPLETE METABOLIC PANEL WITH GFR
ALT: 17 U/L (ref 0–35)
AST: 18 U/L (ref 0–37)
Albumin: 4.3 g/dL (ref 3.5–5.2)
Alkaline Phosphatase: 78 U/L (ref 39–117)
BUN: 14 mg/dL (ref 6–23)
CALCIUM: 10.4 mg/dL (ref 8.4–10.5)
CHLORIDE: 104 meq/L (ref 96–112)
CO2: 26 meq/L (ref 19–32)
Creat: 0.99 mg/dL (ref 0.50–1.10)
GFR, EST NON AFRICAN AMERICAN: 62 mL/min
GFR, Est African American: 72 mL/min
Glucose, Bld: 99 mg/dL (ref 70–99)
Potassium: 4.6 mEq/L (ref 3.5–5.3)
SODIUM: 139 meq/L (ref 135–145)
TOTAL PROTEIN: 7.9 g/dL (ref 6.0–8.3)
Total Bilirubin: 0.4 mg/dL (ref 0.2–1.2)

## 2013-09-23 LAB — HEMOGLOBIN A1C
HEMOGLOBIN A1C: 6.7 % — AB (ref <5.7)
Mean Plasma Glucose: 146 mg/dL — ABNORMAL HIGH (ref <117)

## 2013-09-23 LAB — URIC ACID: Uric Acid, Serum: 6.8 mg/dL (ref 2.4–7.0)

## 2013-09-23 LAB — LIPID PANEL
CHOLESTEROL: 265 mg/dL — AB (ref 0–200)
HDL: 45 mg/dL (ref >39)
LDL Cholesterol: 191 mg/dL — ABNORMAL HIGH (ref 0–99)
Total CHOL/HDL Ratio: 5.9 Ratio
Triglycerides: 144 mg/dL (ref <150)
VLDL: 29 mg/dL (ref 0–40)

## 2013-09-23 LAB — TSH: TSH: 2.44 u[IU]/mL (ref 0.350–4.500)

## 2013-10-02 ENCOUNTER — Telehealth: Payer: Self-pay | Admitting: Family Medicine

## 2013-10-02 DIAGNOSIS — E785 Hyperlipidemia, unspecified: Secondary | ICD-10-CM

## 2013-10-02 MED ORDER — PRAVASTATIN SODIUM 80 MG PO TABS: 80.0000 mg | ORAL_TABLET | Freq: Every evening | ORAL | Status: DC

## 2013-10-02 NOTE — Telephone Encounter (Signed)
Med sent.

## 2013-10-06 ENCOUNTER — Encounter: Payer: Self-pay | Admitting: Cardiovascular Disease

## 2013-10-11 ENCOUNTER — Encounter: Payer: Self-pay | Admitting: Cardiovascular Disease

## 2013-10-11 ENCOUNTER — Ambulatory Visit (INDEPENDENT_AMBULATORY_CARE_PROVIDER_SITE_OTHER): Payer: Federal, State, Local not specified - PPO | Admitting: Cardiovascular Disease

## 2013-10-11 VITALS — HR 95 | Ht 62.0 in | Wt 224.0 lb

## 2013-10-11 DIAGNOSIS — G4733 Obstructive sleep apnea (adult) (pediatric): Secondary | ICD-10-CM

## 2013-10-11 DIAGNOSIS — I10 Essential (primary) hypertension: Secondary | ICD-10-CM

## 2013-10-11 DIAGNOSIS — R079 Chest pain, unspecified: Secondary | ICD-10-CM

## 2013-10-11 DIAGNOSIS — E1142 Type 2 diabetes mellitus with diabetic polyneuropathy: Secondary | ICD-10-CM

## 2013-10-11 DIAGNOSIS — E785 Hyperlipidemia, unspecified: Secondary | ICD-10-CM

## 2013-10-11 DIAGNOSIS — R9431 Abnormal electrocardiogram [ECG] [EKG]: Secondary | ICD-10-CM

## 2013-10-11 DIAGNOSIS — E1149 Type 2 diabetes mellitus with other diabetic neurological complication: Secondary | ICD-10-CM

## 2013-10-11 NOTE — Assessment & Plan Note (Signed)
Discussed low carb diet.  Target hemoglobin A1c is 6.5 or less.  Continue current medications.  

## 2013-10-11 NOTE — Progress Notes (Signed)
Patient ID: Pamela Stein, female   DOB: 1955-04-16, 58 y.o.   MRN: [id number]   58 yo obese diabetic with HTN and elevated lipids Referred by Dr Moshe Cipro for chest pain End of July felt like elephant sitting on chest of a day.  Initially with exertion but then lingered.  No associated diaphoresis, dyspnea or palpitations. Pain has not returned. She started working out at [employer] and feels better No further chest pain Has lost 5 lbs.  She is compliant with her meds.  She seems to understand a low carb diet.  Has never seen heart doctor despite being diabetic since mid 64's  No stress test in last 10 years.  Works 3rd shift at [employer].  Pain was central in chest no associated indigestion or GI symptoms     ROS: Denies fever, malais, weight loss, blurry vision, decreased visual acuity, cough, sputum, SOB, hemoptysis, pleuritic pain, palpitaitons, heartburn, abdominal pain, melena, lower extremity edema, claudication, or rash.  All other systems reviewed and negative   General: Affect appropriate Obese black female  HEENT: normal Neck supple with no adenopathy JVP normal no bruits no thyromegaly Lungs clear with no wheezing and good diaphragmatic motion Heart:  S1/S2 no murmur,rub, gallop or click PMI normal Abdomen: benighn, BS positve, no tenderness, no AAA no bruit.  No HSM or HJR Distal pulses intact with no bruits No edema Neuro non-focal Skin warm and dry No muscular weakness  Medications Current Outpatient Prescriptions  Medication Sig Dispense Refill  . ACCU-CHEK AVIVA PLUS test strip USE AS DIRECTED TO TEST TWICE DAILY  100 each  0  . ACCU-CHEK SOFTCLIX LANCETS lancets USE TO DRAW BLOOD FOR GLUCOSE TEST  100 each  4  . albuterol (PROVENTIL HFA;VENTOLIN HFA) 108 (90 BASE) MCG/ACT inhaler Inhale 2 puffs into the lungs every 6 (six) hours as needed for wheezing.  1 Inhaler  2  . albuterol (PROVENTIL HFA;VENTOLIN HFA) 108 (90 BASE) MCG/ACT inhaler Inhale 2 puffs into the lungs  every 6 (six) hours as needed for wheezing or shortness of breath.  2 Inhaler  0  . allopurinol (ZYLOPRIM) 300 MG tablet Take 1 tablet (300 mg total) by mouth daily.  30 tablet  6  . aspirin 81 MG tablet Take 81 mg by mouth daily.        . benzonatate (TESSALON PERLES) 100 MG capsule Take 1 capsule (100 mg total) by mouth 3 (three) times daily as needed for cough.  30 capsule  0  . calcium-vitamin D (OSCAL WITH D) 500-200 MG-UNIT per tablet Take 1 tablet by mouth 3 (three) times daily.        Marland Kitchen gabapentin (NEURONTIN) 300 MG capsule Take 1 capsule by mouth daily.       . metFORMIN (GLUCOPHAGE) 500 MG tablet Take 1 tablet (500 mg total) by mouth 2 (two) times daily with a meal.  180 tablet  3  . multivitamin (THERAGRAN) per tablet Take 1 tablet by mouth daily.        . pravastatin (PRAVACHOL) 80 MG tablet Take 1 tablet (80 mg total) by mouth every evening.  30 tablet  5  . promethazine-dextromethorphan (PROMETHAZINE-DM) 6.25-15 MG/5ML syrup Take 5 mLs by mouth at bedtime as needed for cough.  118 mL  0  . triamterene-hydrochlorothiazide (MAXZIDE) 75-50 MG per tablet Take 1 tablet by mouth daily.  30 tablet  3   No current facility-administered medications for this visit.    Allergies Ace inhibitors  Family History: Family  History  Problem Relation Age of Onset  . Cancer Father     throat  . Diabetes Maternal Grandmother   . Hypertension Maternal Grandfather   . Hypertension Paternal Grandmother   . Diabetes Paternal Grandfather     Social History: History   Social History  . Marital Status: Single    Spouse Name: N/A    Number of Children: N/A  . Years of Education: N/A   Occupational History  . Not on file.   Social History Main Topics  . Smoking status: Never Smoker   . Smokeless tobacco: Never Used  . Alcohol Use: No  . Drug Use: No  . Sexual Activity: Not Currently    Birth Control/ Protection: Surgical   Other Topics Concern  . Not on file   Social History  Narrative  . No narrative on file    Electrocardiogram:  SR rate 95  LAFB LVH   Assessment and Plan

## 2013-10-11 NOTE — Assessment & Plan Note (Signed)
Well controlled.  Continue current medications and low sodium Dash type diet.    

## 2013-10-11 NOTE — Patient Instructions (Signed)
Your physician recommends that you schedule a follow-up appointment in: as needed   Your physician recommends that you continue on your current medications as directed. Please refer to the Current Medication list given to you today. Your physician has requested that you have en exercise stress myoview. For further information please visit [email or web address]. Please follow instruction sheet, as given.

## 2013-10-11 NOTE — Assessment & Plan Note (Signed)
Discussed relationship with weight Very compliant with CPAP  FU with Dr Annamaria Boots

## 2013-10-11 NOTE — Assessment & Plan Note (Signed)
Given abnormal ECG and longstanding DM will order stress myovue

## 2013-10-11 NOTE — Assessment & Plan Note (Signed)
Should switch to crestor or vytorin  Will defer management to Dr Moshe Cipro . Lab Results  Component Value Date   LDLCALC 191* 09/23/2013

## 2013-10-12 ENCOUNTER — Encounter: Payer: Self-pay | Admitting: Family Medicine

## 2013-10-12 ENCOUNTER — Ambulatory Visit (INDEPENDENT_AMBULATORY_CARE_PROVIDER_SITE_OTHER): Payer: Federal, State, Local not specified - PPO | Admitting: Family Medicine

## 2013-10-12 VITALS — BP 120/70 | HR 82 | Resp 16 | Ht 62.0 in | Wt 226.4 lb

## 2013-10-12 DIAGNOSIS — R7989 Other specified abnormal findings of blood chemistry: Secondary | ICD-10-CM

## 2013-10-12 DIAGNOSIS — E790 Hyperuricemia without signs of inflammatory arthritis and tophaceous disease: Secondary | ICD-10-CM

## 2013-10-12 DIAGNOSIS — E785 Hyperlipidemia, unspecified: Secondary | ICD-10-CM

## 2013-10-12 DIAGNOSIS — Z23 Encounter for immunization: Secondary | ICD-10-CM

## 2013-10-12 DIAGNOSIS — I10 Essential (primary) hypertension: Secondary | ICD-10-CM

## 2013-10-12 DIAGNOSIS — G4733 Obstructive sleep apnea (adult) (pediatric): Secondary | ICD-10-CM

## 2013-10-12 DIAGNOSIS — E669 Obesity, unspecified: Secondary | ICD-10-CM

## 2013-10-12 DIAGNOSIS — E119 Type 2 diabetes mellitus without complications: Secondary | ICD-10-CM

## 2013-10-12 NOTE — Patient Instructions (Addendum)
F/u  In 3. month, call if you need me before  Zostavax at that visit   Blood pressure and blood sugar are excellent, continue medication you currently take  Cholesterol is VERY high, -pls stop butter, cheese and fatty foods  Take pravachol every day as prescribed (bedtime)   Fasting lipid, cmp and EGFR, hBA1c in 3 month  CONGRATS on excellent success at [facility], keep doing this.  All the best with  Upcoming stress test  Flu vaccine today

## 2013-10-14 DIAGNOSIS — Z23 Encounter for immunization: Secondary | ICD-10-CM | POA: Insufficient documentation

## 2013-10-14 NOTE — Progress Notes (Signed)
   Subjective:    Patient ID: Pamela Stein, female    DOB: 09-17-1955, 58 y.o.   MRN: [id number]  HPI The PT is here for follow up and re-evaluation of chronic medical conditions, medication management and review of any available recent lab and radiology data.  Preventive health is updated, specifically  Cancer screening and Immunization.   Has recenetly seen gyne. The PT denies any adverse reactions to current medications since the last visit.  There are no new concerns.  There are no specific complaints  Enjoying [facility] and has a report to show improvement in health       Review of Systems See HPI Denies recent fever or chills. Denies sinus pressure, nasal congestion, ear pain or sore throat. Denies chest congestion, productive cough or wheezing. Denies chest pains, palpitations and leg swelling Denies abdominal pain, nausea, vomiting,diarrhea or constipation.   Denies dysuria, frequency, hesitancy or incontinence. Denies joint pain, swelling and limitation in mobility. Denies headaches, seizures, numbness, or tingling. Denies depression, anxiety or insomnia. Denies skin break down or rash.        Objective:   Physical Exam BP 120/70  Pulse 82  Resp 16  Ht 5\' 2"  (1.575 m)  Wt 226 lb 6.4 oz (102.694 kg)  BMI 41.40 kg/m2  SpO2 97% Patient alert and oriented and in no cardiopulmonary distress.  HEENT: No facial asymmetry, EOMI,   oropharynx pink and moist.  Neck supple no JVD, no mass.  Chest: Clear to auscultation bilaterally.  CVS: S1, S2 no murmurs, no S3.Regular rate.  ABD: Soft non tender.   Ext: No edema  MS: Adequate ROM spine, shoulders, hips and knees.  Skin: Intact, no ulcerations or rash noted.  Psych: Good eye contact, normal affect. Memory intact not anxious or depressed appearing.  CNS: CN 2-12 intact, power,  normal throughout.no focal deficits noted.        Assessment & Plan:  HYPERTENSION Controlled, no change in  medication DASH diet and commitment to daily physical activity for a minimum of 30 minutes discussed and encouraged, as a part of hypertension management. The importance of attaining a healthy weight is also discussed.   Diabetes mellitus Controlled, no change in medication Patient advised to reduce carb and sweets, commit to regular physical activity, take meds as prescribed, test blood as directed, and attempt to lose weight, to improve blood sugar control.   HYPERLIPIDEMIA Uncontrolled Dose in pravachol instituted prior to visit Hyperlipidemia:Low fat diet discussed and encouraged.  Updated lab needed at/ before next visit.   OBESITY Improved. Pt applauded on succesful weight loss through lifestyle change, and encouraged to continue same. Weight loss goal set for the next several months.   Hyperuricemia Improved, continue allopurinol  OBSTRUCTIVE SLEEP APNEA Reports consistent use of CPAP with improved wellbeing  Need for prophylactic vaccination and inoculation against influenza Vaccine administered at visit.

## 2013-10-14 NOTE — Assessment & Plan Note (Signed)
Vaccine administered at visit.  

## 2013-10-14 NOTE — Assessment & Plan Note (Signed)
Controlled, no change in medication DASH diet and commitment to daily physical activity for a minimum of 30 minutes discussed and encouraged, as a part of hypertension management. The importance of attaining a healthy weight is also discussed.  

## 2013-10-14 NOTE — Assessment & Plan Note (Signed)
Controlled, no change in medication Patient advised to reduce carb and sweets, commit to regular physical activity, take meds as prescribed, test blood as directed, and attempt to lose weight, to improve blood sugar control.  

## 2013-10-14 NOTE — Assessment & Plan Note (Signed)
Improved. Pt applauded on succesful weight loss through lifestyle change, and encouraged to continue same. Weight loss goal set for the next several months.  

## 2013-10-14 NOTE — Assessment & Plan Note (Signed)
Improved, continue allopurinol

## 2013-10-14 NOTE — Assessment & Plan Note (Signed)
Uncontrolled Dose in pravachol instituted prior to visit Hyperlipidemia:Low fat diet discussed and encouraged.  Updated lab needed at/ before next visit.

## 2013-10-14 NOTE — Assessment & Plan Note (Signed)
Reports consistent use of CPAP with improved wellbeing

## 2013-10-23 ENCOUNTER — Ambulatory Visit (HOSPITAL_COMMUNITY): Payer: Federal, State, Local not specified - PPO | Attending: Cardiovascular Disease | Admitting: Radiology

## 2013-10-23 VITALS — BP 143/94 | Ht 62.5 in | Wt 222.0 lb

## 2013-10-23 DIAGNOSIS — R079 Chest pain, unspecified: Secondary | ICD-10-CM | POA: Diagnosis not present

## 2013-10-23 DIAGNOSIS — I10 Essential (primary) hypertension: Secondary | ICD-10-CM | POA: Diagnosis not present

## 2013-10-23 DIAGNOSIS — I4949 Other premature depolarization: Secondary | ICD-10-CM

## 2013-10-23 DIAGNOSIS — E119 Type 2 diabetes mellitus without complications: Secondary | ICD-10-CM | POA: Insufficient documentation

## 2013-10-23 DIAGNOSIS — R002 Palpitations: Secondary | ICD-10-CM | POA: Insufficient documentation

## 2013-10-23 DIAGNOSIS — I491 Atrial premature depolarization: Secondary | ICD-10-CM

## 2013-10-23 DIAGNOSIS — R9431 Abnormal electrocardiogram [ECG] [EKG]: Secondary | ICD-10-CM

## 2013-10-23 MED ORDER — TECHNETIUM TC 99M SESTAMIBI GENERIC - CARDIOLITE
30.0000 | Freq: Once | INTRAVENOUS | Status: AC | PRN
  Administered 2013-10-23: 30 via INTRAVENOUS

## 2013-10-23 NOTE — Progress Notes (Signed)
[facility] [place], [place] [address] [phone]    Cardiology Nuclear Med Study  Pamela Stein is a 58 y.o. female     MRN : [id number]     DOB: 1955-04-22  Procedure Date: 10/24/2013  Nuclear Med Background Indication for Stress Test:  Evaluation for Ischemia and Abnormal EKG History:  '06 MPI: NL EF: 65% [place] Cardiac Risk Factors: Hypertension and NIDDM  Symptoms:  Chest Pain and Palpitations   Nuclear Pre-Procedure Caffeine/Decaff Intake:  None> 12 hrs NPO After: 10:30am   Lungs:  clear O2 Sat: 96% on room air. IV 0.9% NS with Angio Cath:  22g  IV Site: R Hand x 1, tolerated well IV Started by:  Pamela Baltimore, RN  Chest Size (in):  44 Cup Size: D  Height: 5' 2.5" (1.588 m)  Weight:  222 lb (100.699 kg)  BMI:  Body mass index is 39.93 kg/(m^2). Tech Comments:  Patient took Metformin this am with breakfast. Pamela Baltimore, RN.    Nuclear Med Study 1 or 2 day study: 2 day  Stress Test Type:  Stress  Reading MD: N/A  Order Authorizing Provider:  Jenkins Rouge, MD  Resting Radionuclide: Technetium 47m Sestamibi  Resting Radionuclide Dose: 33.0 mCi on 10/25/13   Stress Radionuclide:  Technetium 49m Sestamibi  Stress Radionuclide Dose: 33.0 mCi on 10/23/13           Stress Protocol Rest HR: 96 Stress HR: 148  Rest BP: 143/94 Stress BP: 216/75  Exercise Time (min): 7:00 METS: 8.50   Predicted Max HR: 160 bpm % Max HR: 92.5 bpm Rate Pressure Product: 31968   Dose of Adenosine (mg):  n/a Dose of Lexiscan: n/a mg  Dose of Atropine (mg): n/a Dose of Dobutamine: n/a mcg/kg/min (at max HR)  Stress Test Technologist: Perrin Maltese, EMT-P  Nuclear Technologist:  Annye Rusk, CNMT     Rest Procedure:  Myocardial perfusion imaging was performed at rest 45 minutes following the intravenous administration of Technetium 34m Sestamibi. Rest ECG: NSR - Normal EKG  Stress Procedure:  The patient exercised on the treadmill  utilizing the Bruce Protocol for 7:00 minutes. The patient stopped due to fatigue, sob, and denied any chest pain.  Technetium 74m Sestamibi was injected at peak exercise and myocardial perfusion imaging was performed after a brief delay. Stress ECG: No significant change from baseline ECG  QPS Raw Data Images:  Normal; no motion artifact; normal heart/lung ratio. Stress Images:  Normal homogeneous uptake in all areas of the myocardium. Rest Images:  Normal homogeneous uptake in all areas of the myocardium. Subtraction (SDS):  There is no evidence of scar or ischemia. Transient Ischemic Dilatation (Normal <1.22):  1.04 Lung/Heart Ratio (Normal <0.45):  0.27  Quantitative Gated Spect Images QGS EDV:  51 ml QGS ESV:  18 ml  Impression Exercise Capacity:  Fair exercise capacity. BP Response:  Hypertensive blood pressure response. Clinical Symptoms:  Dyspnea, fatigue.  ECG Impression:  No significant ST segment change suggestive of ischemia. Comparison with Prior Nuclear Study: No images to compare  Overall Impression:  Normal stress nuclear study.  LV Ejection Fraction: 64%.  LV Wall Motion:  NL LV Function; NL Wall Motion  Loralie Champagne 10/25/2013

## 2013-10-25 ENCOUNTER — Ambulatory Visit (HOSPITAL_COMMUNITY): Payer: Federal, State, Local not specified - PPO | Attending: Cardiovascular Disease

## 2013-10-25 DIAGNOSIS — R0989 Other specified symptoms and signs involving the circulatory and respiratory systems: Secondary | ICD-10-CM

## 2013-10-25 MED ORDER — TECHNETIUM TC 99M SESTAMIBI GENERIC - CARDIOLITE
33.0000 | Freq: Once | INTRAVENOUS | Status: AC | PRN
Start: 2013-10-25 — End: 2013-10-25
  Administered 2013-10-25: 33 via INTRAVENOUS

## 2013-10-30 ENCOUNTER — Telehealth: Payer: Self-pay | Admitting: Cardiovascular Disease

## 2013-10-30 NOTE — Telephone Encounter (Signed)
PT AWARE OF MYOVIEW RESULTS./CY 

## 2013-10-30 NOTE — Telephone Encounter (Signed)
New problem   Pt returning a call concerning test results for stress test.

## 2013-11-20 ENCOUNTER — Encounter: Payer: Self-pay | Admitting: Internal Medicine

## 2013-12-04 HISTORY — PX: EYE SURGERY: SHX253

## 2013-12-11 ENCOUNTER — Encounter: Payer: Self-pay | Admitting: Family Medicine

## 2014-01-05 ENCOUNTER — Other Ambulatory Visit: Payer: Self-pay | Admitting: Family Medicine

## 2014-01-10 ENCOUNTER — Ambulatory Visit (INDEPENDENT_AMBULATORY_CARE_PROVIDER_SITE_OTHER): Payer: Federal, State, Local not specified - PPO | Admitting: Adult Health

## 2014-01-10 ENCOUNTER — Encounter: Payer: Self-pay | Admitting: Adult Health

## 2014-01-10 VITALS — BP 128/78 | HR 80 | Ht 62.5 in | Wt 223.5 lb

## 2014-01-10 DIAGNOSIS — Z01419 Encounter for gynecological examination (general) (routine) without abnormal findings: Secondary | ICD-10-CM

## 2014-01-10 DIAGNOSIS — Z1212 Encounter for screening for malignant neoplasm of rectum: Secondary | ICD-10-CM

## 2014-01-10 LAB — HEMOCCULT GUIAC POC 1CARD (OFFICE): Fecal Occult Blood, POC: NEGATIVE

## 2014-01-10 NOTE — Patient Instructions (Signed)
Physical in 1 year Mammogram yearly Labs PCP Colonoscopy 2018

## 2014-01-10 NOTE — Progress Notes (Signed)
Patient ID: Pamela Stein, female   DOB: [birth year], 58 y.o.   MRN: [id number] History of Present Illness: Pamela Stein is a 58 year old black female,single in for gyn exam.Complains of voiding often.Had cataract surgery in October.   Current Medications, Allergies, Past Medical History, Past Surgical History, Family History and Social History were reviewed in [employer] record.     Review of Systems:  Patient denies any headaches, blurred vision, shortness of breath, chest pain, abdominal pain, problems with bowel movements, urination, or intercourse. Not having sex, no joint pian or mood swings.   Physical Exam:BP 128/78 mmHg  Pulse 80  Ht 5' 2.5" (1.588 m)  Wt 223 lb 8 oz (101.379 kg)  BMI 40.20 kg/m2 General:  Well developed, well nourished, no acute distress Skin:  Warm and dry Neck:  Midline trachea, normal thyroid Lungs; Clear to auscultation bilaterally Breast:  No dominant palpable mass, retraction, or nipple discharge Cardiovascular: Regular rate and rhythm Abdomen:  Soft, non tender, no hepatosplenomegaly Pelvic:  External genitalia is normal in appearance for age, no lesions.  The vagina is normal in appearance. The cervix and uterus are absent. No  adnexal masses or tenderness noted. Rectal: Good sphincter tone, no polyps, or hemorrhoids felt.  Hemoccult negative. Extremities:  No swelling or varicosities noted Psych:  No mood changes,alert and cooperative,seems happy Declines meds for OAB.   Impression: Well woman gyn exam no pap    Plan: Physical in 1 year Mammogram yearly Colonoscopy 2018  Labs with PCP

## 2014-01-18 ENCOUNTER — Ambulatory Visit (INDEPENDENT_AMBULATORY_CARE_PROVIDER_SITE_OTHER): Payer: Federal, State, Local not specified - PPO | Admitting: Family Medicine

## 2014-01-18 ENCOUNTER — Encounter: Payer: Self-pay | Admitting: Family Medicine

## 2014-01-18 VITALS — BP 148/84 | HR 83 | Resp 16 | Ht 63.0 in | Wt 225.0 lb

## 2014-01-18 DIAGNOSIS — I10 Essential (primary) hypertension: Secondary | ICD-10-CM

## 2014-01-18 DIAGNOSIS — E119 Type 2 diabetes mellitus without complications: Secondary | ICD-10-CM

## 2014-01-18 DIAGNOSIS — IMO0001 Reserved for inherently not codable concepts without codable children: Secondary | ICD-10-CM

## 2014-01-18 DIAGNOSIS — E785 Hyperlipidemia, unspecified: Secondary | ICD-10-CM

## 2014-01-18 NOTE — Patient Instructions (Signed)
F/u in 3.5 month, call if you need me before  Blood pressure slightly high today, if it is high the next time , then I will need to increase medication   Fasting lipid, cmp and eGFr, hBA1C and CBC this week please, ensure you drink water the day you are getting blood drawn  Microalb from office today  Zostavax next visit pls as promised  All the best for 2016!  ,It is important that you exercise regularly at least 30 minutes 5 times a week. If you develop chest pain, have severe difficulty breathing, or feel very tired, stop exercising immediately and seek medical attention   Change metformin to one tablet once daily, instead of taking half tablet twice daily

## 2014-01-19 LAB — MICROALBUMIN / CREATININE URINE RATIO
Creatinine, Urine: 97.1 mg/dL
MICROALB/CREAT RATIO: 11.3 mg/g (ref 0.0–30.0)
Microalb, Ur: 1.1 mg/dL (ref <2.0)

## 2014-01-19 NOTE — Assessment & Plan Note (Signed)
Patient advised to reduce carb and sweets, commit to regular physical activity, take meds as prescribed, test blood as directed, and attempt to lose weight, to improve blood sugar control. Updated lab needed

## 2014-01-19 NOTE — Progress Notes (Signed)
   Subjective:    Patient ID: Pamela Stein, female    DOB: 03-10-1955, 58 y.o.   MRN: [id number]  HPI The PT is here for follow up and re-evaluation of chronic medical conditions, medication management and review of any available recent lab and radiology data.  Preventive health is updated, specifically  Cancer screening and Immunization.  Elects to defer needed immunizations today, but states she will get them Questions or concerns regarding consultations or procedures which the PT has had in the interim are  Addressed.Has had gyne eval, no issues The PT denies any adverse reactions to current medications since the last visit.  There are no new concerns.  There are no specific complaints        Review of Systems See HPI Denies recent fever or chills. Denies sinus pressure, nasal congestion, ear pain or sore throat. Denies chest congestion, productive cough or wheezing. Denies chest pains, palpitations and leg swelling Denies abdominal pain, nausea, vomiting,diarrhea or constipation.   Denies dysuria, frequency, hesitancy or incontinence. Denies joint pain, swelling and limitation in mobility. Denies headaches, seizures, numbness, or tingling. Denies depression, anxiety or insomnia. Denies skin break down or rash.        Objective:   Physical Exam  BP 148/84 mmHg  Pulse 83  Resp 16  Ht 5\' 3"  (1.6 m)  Wt 225 lb (102.059 kg)  BMI 39.87 kg/m2  SpO2 96% Patient alert and oriented and in no cardiopulmonary distress.  HEENT: No facial asymmetry, EOMI,   oropharynx pink and moist.  Neck supple no JVD, no mass.  Chest: Clear to auscultation bilaterally.  CVS: S1, S2 no murmurs, no S3.Regular rate.  ABD: Soft non tender.   Ext: No edema  MS: Adequate ROM spine, shoulders, hips and knees.  Skin: Intact, no ulcerations or rash noted.  Psych: Good eye contact, normal affect. Memory intact not anxious or depressed appearing.  CNS: CN 2-12 intact, power,  normal  throughout.no focal deficits noted.       Assessment & Plan:  Essential hypertension Elevated at this visit. Pt sites fatigue as the reason DASH diet and commitment to daily physical activity for a minimum of 30 minutes discussed and encouraged, as a part of hypertension management. The importance of attaining a healthy weight is also discussed. If elevated at next visit , then she will need increase in med dose  Diabetes mellitus Patient advised to reduce carb and sweets, commit to regular physical activity, take meds as prescribed, test blood as directed, and attempt to lose weight, to improve blood sugar control. Updated lab needed    Hyperlipidemia LDL goal <100 Uncontrolled Hyperlipidemia:Low fat diet discussed and encouraged.  Updated lab needed    Obesity, Class II, BMI 35-39.9, with comorbidity Unchanged. Patient re-educated about  the importance of commitment to a  minimum of 150 minutes of exercise per week. The importance of healthy food choices with portion control discussed. Encouraged to start a food diary, count calories and to consider  joining a support group. Sample diet sheets offered. Goals set by the patient for the next several months.

## 2014-01-19 NOTE — Assessment & Plan Note (Signed)
Elevated at this visit. Pt sites fatigue as the reason DASH diet and commitment to daily physical activity for a minimum of 30 minutes discussed and encouraged, as a part of hypertension management. The importance of attaining a healthy weight is also discussed. If elevated at next visit , then she will need increase in med dose

## 2014-01-19 NOTE — Assessment & Plan Note (Signed)
Unchanged. Patient re-educated about  the importance of commitment to a  minimum of 150 minutes of exercise per week. The importance of healthy food choices with portion control discussed. Encouraged to start a food diary, count calories and to consider  joining a support group. Sample diet sheets offered. Goals set by the patient for the next several months.    

## 2014-01-19 NOTE — Assessment & Plan Note (Signed)
Uncontrolled Hyperlipidemia:Low fat diet discussed and encouraged.  Updated lab needed

## 2014-01-24 ENCOUNTER — Other Ambulatory Visit: Payer: Self-pay | Admitting: Family Medicine

## 2014-01-25 LAB — COMPLETE METABOLIC PANEL WITH GFR
ALBUMIN: 4.2 g/dL (ref 3.5–5.2)
ALK PHOS: 79 U/L (ref 39–117)
ALT: 15 U/L (ref 0–35)
AST: 15 U/L (ref 0–37)
BUN: 15 mg/dL (ref 6–23)
CALCIUM: 10.6 mg/dL — AB (ref 8.4–10.5)
CO2: 27 mEq/L (ref 19–32)
Chloride: 103 mEq/L (ref 96–112)
Creat: 1.04 mg/dL (ref 0.50–1.10)
GFR, Est African American: 67 mL/min
GFR, Est Non African American: 59 mL/min — ABNORMAL LOW
Glucose, Bld: 94 mg/dL (ref 70–99)
POTASSIUM: 4.3 meq/L (ref 3.5–5.3)
SODIUM: 137 meq/L (ref 135–145)
TOTAL PROTEIN: 7.7 g/dL (ref 6.0–8.3)
Total Bilirubin: 0.5 mg/dL (ref 0.2–1.2)

## 2014-01-25 LAB — LIPID PANEL
CHOL/HDL RATIO: 4.6 ratio
CHOLESTEROL: 216 mg/dL — AB (ref 0–200)
HDL: 47 mg/dL (ref >39)
LDL Cholesterol: 147 mg/dL — ABNORMAL HIGH (ref 0–99)
Triglycerides: 108 mg/dL (ref <150)
VLDL: 22 mg/dL (ref 0–40)

## 2014-01-25 LAB — CBC
HEMATOCRIT: 36.7 % (ref 36.0–46.0)
HEMOGLOBIN: 11.4 g/dL — AB (ref 12.0–15.0)
MCH: 20.8 pg — ABNORMAL LOW (ref 26.0–34.0)
MCHC: 31.1 g/dL (ref 30.0–36.0)
MCV: 67 fL — AB (ref 78.0–100.0)
Platelets: 368 10*3/uL (ref 150–400)
RBC: 5.48 MIL/uL — AB (ref 3.87–5.11)
RDW: 19.4 % — ABNORMAL HIGH (ref 11.5–15.5)
WBC: 9.8 10*3/uL (ref 4.0–10.5)

## 2014-01-25 LAB — FERRITIN: FERRITIN: 57 ng/mL (ref 10–291)

## 2014-01-25 LAB — HEMOGLOBIN A1C
HEMOGLOBIN A1C: 6.8 % — AB (ref <5.7)
Mean Plasma Glucose: 148 mg/dL — ABNORMAL HIGH (ref <117)

## 2014-01-25 LAB — IRON: Iron: 47 ug/dL (ref 42–145)

## 2014-03-27 ENCOUNTER — Other Ambulatory Visit: Payer: Self-pay | Admitting: Family Medicine

## 2014-04-02 ENCOUNTER — Telehealth: Payer: Self-pay

## 2014-04-02 DIAGNOSIS — G4733 Obstructive sleep apnea (adult) (pediatric): Secondary | ICD-10-CM

## 2014-04-02 NOTE — Telephone Encounter (Signed)
Patient states she broke her CPAP mask and tubing and [facility] told her to get her PCP to write the rx on pad and send it in. Ok to send script? Fax [phone]

## 2014-04-02 NOTE — Telephone Encounter (Signed)
Patient aware and referred

## 2014-04-02 NOTE — Addendum Note (Signed)
Addended by: Eual Fines on: 04/02/2014 03:38 PM   Modules accepted: Orders

## 2014-04-02 NOTE — Telephone Encounter (Signed)
She has not seen the treating MD [facility] since 2009. Needs to be re evaluated by him and he will order the supplies explain to pt and [facility] , enter referral pls I will sign  Pressure setting etc may have changed by now

## 2014-05-02 ENCOUNTER — Other Ambulatory Visit: Payer: Self-pay

## 2014-05-02 DIAGNOSIS — E119 Type 2 diabetes mellitus without complications: Secondary | ICD-10-CM

## 2014-05-02 MED ORDER — METFORMIN HCL 500 MG PO TABS: 500.0000 mg | ORAL_TABLET | Freq: Two times a day (BID) | ORAL | Status: DC

## 2014-05-02 MED ORDER — METFORMIN HCL 500 MG PO TABS
500.0000 mg | ORAL_TABLET | Freq: Two times a day (BID) | ORAL | Status: DC
Start: 2014-05-02 — End: 2015-10-30

## 2014-05-08 ENCOUNTER — Other Ambulatory Visit: Payer: Self-pay | Admitting: Family Medicine

## 2014-05-11 ENCOUNTER — Telehealth: Payer: Self-pay | Admitting: Family Medicine

## 2014-05-11 DIAGNOSIS — E790 Hyperuricemia without signs of inflammatory arthritis and tophaceous disease: Secondary | ICD-10-CM

## 2014-05-11 NOTE — Telephone Encounter (Signed)
Ok to add

## 2014-05-11 NOTE — Telephone Encounter (Signed)
Pt aware order faxed.

## 2014-05-11 NOTE — Telephone Encounter (Signed)
pls add uric acid level dx is  hyperuicemia

## 2014-05-11 NOTE — Addendum Note (Signed)
Addended by: Eual Fines on: 05/11/2014 03:18 PM   Modules accepted: Orders

## 2014-05-15 ENCOUNTER — Other Ambulatory Visit: Payer: Self-pay

## 2014-05-15 DIAGNOSIS — E785 Hyperlipidemia, unspecified: Secondary | ICD-10-CM

## 2014-05-15 DIAGNOSIS — E119 Type 2 diabetes mellitus without complications: Secondary | ICD-10-CM

## 2014-05-16 LAB — COMPREHENSIVE METABOLIC PANEL
ALT: 13 U/L (ref 0–35)
AST: 15 U/L (ref 0–37)
Albumin: 4.2 g/dL (ref 3.5–5.2)
Alkaline Phosphatase: 84 U/L (ref 39–117)
BILIRUBIN TOTAL: 0.5 mg/dL (ref 0.2–1.2)
BUN: 29 mg/dL — ABNORMAL HIGH (ref 6–23)
CO2: 26 mEq/L (ref 19–32)
Calcium: 10.5 mg/dL (ref 8.4–10.5)
Chloride: 100 mEq/L (ref 96–112)
Creat: 1.42 mg/dL — ABNORMAL HIGH (ref 0.50–1.10)
GLUCOSE: 97 mg/dL (ref 70–99)
Potassium: 4.4 mEq/L (ref 3.5–5.3)
SODIUM: 136 meq/L (ref 135–145)
TOTAL PROTEIN: 7.8 g/dL (ref 6.0–8.3)

## 2014-05-16 LAB — HEMOGLOBIN A1C
Hgb A1c MFr Bld: 7.2 % — ABNORMAL HIGH (ref <5.7)
MEAN PLASMA GLUCOSE: 160 mg/dL — AB (ref <117)

## 2014-05-16 LAB — LIPID PANEL
CHOLESTEROL: 184 mg/dL (ref 0–200)
HDL: 34 mg/dL — ABNORMAL LOW (ref >=46)
LDL Cholesterol: 122 mg/dL — ABNORMAL HIGH (ref 0–99)
Total CHOL/HDL Ratio: 5.4 Ratio
Triglycerides: 141 mg/dL (ref <150)
VLDL: 28 mg/dL (ref 0–40)

## 2014-05-16 LAB — URIC ACID: Uric Acid, Serum: 14.2 mg/dL — ABNORMAL HIGH (ref 2.4–7.0)

## 2014-05-16 MED ORDER — AMLODIPINE BESYLATE 10 MG PO TABS: 10.0000 mg | ORAL_TABLET | Freq: Every day | ORAL | Status: DC

## 2014-05-16 MED ORDER — ALLOPURINOL 300 MG PO TABS: 300.0000 mg | ORAL_TABLET | Freq: Every day | ORAL | Status: DC

## 2014-05-16 NOTE — Addendum Note (Signed)
Addended by: Denman George B on: 05/16/2014 04:41 PM   Modules accepted: Orders

## 2014-05-21 LAB — BASIC METABOLIC PANEL
BUN: 23 mg/dL (ref 6–23)
CHLORIDE: 103 meq/L (ref 96–112)
CO2: 28 meq/L (ref 19–32)
Calcium: 10.4 mg/dL (ref 8.4–10.5)
Creat: 1.32 mg/dL — ABNORMAL HIGH (ref 0.50–1.10)
Glucose, Bld: 96 mg/dL (ref 70–99)
Potassium: 3.7 mEq/L (ref 3.5–5.3)
SODIUM: 139 meq/L (ref 135–145)

## 2014-05-23 ENCOUNTER — Ambulatory Visit (INDEPENDENT_AMBULATORY_CARE_PROVIDER_SITE_OTHER): Payer: Federal, State, Local not specified - PPO | Admitting: Family Medicine

## 2014-05-23 ENCOUNTER — Encounter: Payer: Self-pay | Admitting: Family Medicine

## 2014-05-23 VITALS — BP 120/80 | HR 66 | Resp 16 | Ht 63.0 in | Wt 218.1 lb

## 2014-05-23 DIAGNOSIS — E119 Type 2 diabetes mellitus without complications: Secondary | ICD-10-CM | POA: Diagnosis not present

## 2014-05-23 DIAGNOSIS — E785 Hyperlipidemia, unspecified: Secondary | ICD-10-CM

## 2014-05-23 DIAGNOSIS — E790 Hyperuricemia without signs of inflammatory arthritis and tophaceous disease: Secondary | ICD-10-CM | POA: Diagnosis not present

## 2014-05-23 DIAGNOSIS — L309 Dermatitis, unspecified: Secondary | ICD-10-CM

## 2014-05-23 DIAGNOSIS — I10 Essential (primary) hypertension: Secondary | ICD-10-CM

## 2014-05-23 MED ORDER — HYDROXYZINE HCL 25 MG PO TABS: ORAL_TABLET | ORAL | Status: DC

## 2014-05-23 MED ORDER — PREDNISONE (PAK) 5 MG PO TABS: 5.0000 mg | ORAL_TABLET | ORAL | Status: DC

## 2014-05-23 MED ORDER — METHYLPREDNISOLONE ACETATE 80 MG/ML IJ SUSP
80.0000 mg | Freq: Once | INTRAMUSCULAR | Status: AC
  Administered 2014-05-23: 80 mg via INTRAMUSCULAR

## 2014-05-23 NOTE — Patient Instructions (Signed)
F/u with foot exam in 4 month, call if you need me before  PLEASE call next week if the rash on your hands is not much better  Please stop neosporin Injection today and prednisone for 6 days, also pill at night for itching , hydroxyzine Sleep with hands covered, whether gloves or socks so that you don't scratch skin while asleep   BP excellent  Increase vegetable  intake reduce starch, I look forward to you getting back to exercise again soon  HBA1C, fasting lipid, cmp and EGFR, URIC acid level in 4 months  Blood sugar is up a bit and cholesterol is improving

## 2014-05-23 NOTE — Assessment & Plan Note (Signed)
2 week h/o itchy rash on both hands, no fever, redness or purulent drainage Depo medrol in office then steroid dose pack, benadryl at night

## 2014-06-01 ENCOUNTER — Institutional Professional Consult (permissible substitution): Payer: Self-pay | Admitting: Internal Medicine

## 2014-06-08 ENCOUNTER — Telehealth: Payer: Self-pay

## 2014-06-08 ENCOUNTER — Other Ambulatory Visit: Payer: Self-pay | Admitting: Family Medicine

## 2014-06-08 DIAGNOSIS — E119 Type 2 diabetes mellitus without complications: Secondary | ICD-10-CM

## 2014-06-08 DIAGNOSIS — E785 Hyperlipidemia, unspecified: Secondary | ICD-10-CM

## 2014-06-08 DIAGNOSIS — I10 Essential (primary) hypertension: Secondary | ICD-10-CM

## 2014-06-08 DIAGNOSIS — E790 Hyperuricemia without signs of inflammatory arthritis and tophaceous disease: Secondary | ICD-10-CM

## 2014-06-08 LAB — BASIC METABOLIC PANEL
BUN: 16 mg/dL (ref 6–23)
CO2: 23 mEq/L (ref 19–32)
CREATININE: 1.03 mg/dL (ref 0.50–1.10)
Calcium: 10.4 mg/dL (ref 8.4–10.5)
Chloride: 103 mEq/L (ref 96–112)
GLUCOSE: 96 mg/dL (ref 70–99)
POTASSIUM: 4.4 meq/L (ref 3.5–5.3)
Sodium: 138 mEq/L (ref 135–145)

## 2014-06-08 LAB — URIC ACID: URIC ACID, SERUM: 3.6 mg/dL (ref 2.4–7.0)

## 2014-06-08 MED ORDER — INDOMETHACIN 50 MG PO CAPS: ORAL_CAPSULE | ORAL | Status: DC

## 2014-06-08 MED ORDER — PREDNISONE 5 MG (21) PO TBPK: 5.0000 mg | ORAL_TABLET | ORAL | Status: DC

## 2014-06-08 NOTE — Addendum Note (Signed)
Addended by: Eual Fines on: 06/08/2014 09:50 AM   Modules accepted: Orders

## 2014-06-08 NOTE — Telephone Encounter (Signed)
I spoke directly with pt, states she worked last week, but niot this week as she experienced increased pain and swelling of the foot, has been on allopurinol also. I have advised her to stop the allopurioinol She is to get labs today, chem 7 and uric acid level stat this am  I have sent in another prednisone dose pack which she is to take and also indomethacin short course. Pls order labs and fax over and also call her with appt info

## 2014-06-08 NOTE — Telephone Encounter (Signed)
States she still has swelling in her left foot and its very tender. Hasn't been able to work and wants something stronger than the allopurinol. It hasn't gotten better and its been hurting so bad. Please advise [phone]

## 2014-06-11 ENCOUNTER — Ambulatory Visit (INDEPENDENT_AMBULATORY_CARE_PROVIDER_SITE_OTHER): Payer: Federal, State, Local not specified - PPO | Admitting: Family Medicine

## 2014-06-11 ENCOUNTER — Encounter: Payer: Self-pay | Admitting: Family Medicine

## 2014-06-11 VITALS — BP 124/76 | HR 68 | Resp 18 | Ht 63.0 in | Wt 216.0 lb

## 2014-06-11 DIAGNOSIS — I10 Essential (primary) hypertension: Secondary | ICD-10-CM

## 2014-06-11 DIAGNOSIS — M10072 Idiopathic gout, left ankle and foot: Secondary | ICD-10-CM | POA: Diagnosis not present

## 2014-06-11 DIAGNOSIS — E790 Hyperuricemia without signs of inflammatory arthritis and tophaceous disease: Secondary | ICD-10-CM | POA: Diagnosis not present

## 2014-06-11 DIAGNOSIS — E785 Hyperlipidemia, unspecified: Secondary | ICD-10-CM

## 2014-06-11 DIAGNOSIS — M109 Gout, unspecified: Secondary | ICD-10-CM | POA: Insufficient documentation

## 2014-06-11 DIAGNOSIS — E119 Type 2 diabetes mellitus without complications: Secondary | ICD-10-CM

## 2014-06-11 NOTE — Patient Instructions (Signed)
F/U in 3 month with foot exam  Labs are [facility] BETTER, excellent  KEEP up new eating habits you have lost  9 pounds since Christmas  Mammogram due June 5 or after, please schedule  It is important that you exercise regularly at least 30 minutes 5 times a week. If you develop chest pain, have severe difficulty breathing, or feel very tired, stop exercising immediately and seek medical attention     Fasting lipid, cmp and EGFR, hBA1c, uric acid  in 3 month   Resume allopurinol once you finish the indomethacin and prednisone you are now taking  Excellent blood pressure!  Thanks for choosing [facility], we consider it a privelige to serve you.

## 2014-06-11 NOTE — Progress Notes (Signed)
Subjective:    Patient ID: Pamela Stein, female    DOB: 04-23-1955, 59 y.o.   MRN: [id number]  HPI C/o acute pain and swelling of left foot, no trauma, has established diagnosis of gout. Denies fever , chills or generalized aches   Review of Systems See HPI Denies recent fever or chills. Denies sinus pressure, nasal congestion, ear pain or sore throat. Denies chest congestion, productive cough or wheezing. Denies chest pains, palpitations and leg swelling Denies abdominal pain, nausea, vomiting,diarrhDenies joint pain, swelling and limitation in mobility. Denies headaches, seizures, numbness, or tingling. Denies depression, anxiety or insomnia. Denies skin break down or rash.        Objective:   Physical Exam  BP 124/76 mmHg  Pulse 68  Resp 18  Ht 5\' 3"  (1.6 m)  Wt 216 lb (97.977 kg)  BMI 38.27 kg/m2  SpO2 98% Pt in pain, difficulty with weight bearing and ambulation Patient alert and oriented and in no cardiopulmonary distress.  HEENT: No facial asymmetry, EOMI,   oropharynx pink and moist.  Neck supple no JVD, no mass.  Chest: Clear to auscultation bilaterally.  CVS: S1, S2 no murmurs, no S3.Regular rate.  ABD: Soft non tender.   Ext: No edema  MS: Adequate ROM spine, shoulders, hips and knees.Left foot swollen, warm and tender  Skin: Intact, no ulcerations or rash noted.  Psych: Good eye contact, normal affect. Memory intact not anxious or depressed appearing.  CNS: CN 2-12 intact, power,  normal throughout.no focal deficits noted.       Assessment & Plan:  Essential hypertension Controlled, no change in medication DASH diet and commitment to daily physical activity for a minimum of 30 minutes discussed and encouraged, as a part of hypertension management. The importance of attaining a healthy weight is also discussed.  BP/Weight 06/11/2014 05/23/2014 01/18/2014 01/10/2014 10/23/2013 03/18/7822 03/15/5359  Systolic BP 443 154 008 676 195 093 -    Diastolic BP 76 80 84 78 94 70 -  Wt. (Lbs) 216 218.12 225 223.5 222 226.4 224  BMI 38.27 38.65 39.87 40.2 39.93 41.4 40.96         Diabetes mellitus Improved Patient educated about the importance of limiting  Carbohydrate intake , the need to commit to daily physical activity for a minimum of 30 minutes , and to commit weight loss. The fact that changes in all these areas will reduce complications from diabetes is stressed.   Diabetic Labs Latest Ref Rng 06/08/2014 05/21/2014 05/15/2014 01/24/2014 01/18/2014  HbA1c <5.7 % - - 7.2(H) 6.8(H) -  Microalbumin <2.0 mg/dL - - - - 1.1  Micro/Creat Ratio 0.0 - 30.0 mg/g - - - - 11.3  Chol 0 - 200 mg/dL - - 184 216(H) -  HDL >=46 mg/dL - - 34(L) 47 -  Calc LDL 0 - 99 mg/dL - - 122(H) 147(H) -  Triglycerides <150 mg/dL - - 141 108 -  Creatinine 0.50 - 1.10 mg/dL 1.03 1.32(H) 1.42(H) 1.04 -   BP/Weight 06/11/2014 05/23/2014 01/18/2014 01/10/2014 10/23/2013 03/17/7122 06/16/996  Systolic BP 338 250 539 767 341 937 -  Diastolic BP 76 80 84 78 94 70 -  Wt. (Lbs) 216 218.12 225 223.5 222 226.4 224  BMI 38.27 38.65 39.87 40.2 39.93 41.4 40.96   Foot/eye exam completion dates Latest Ref Rng 09/07/2013 07/10/2013  Eye Exam No Retinopathy - No Retinopathy  Foot Form Completion - Done -        Morbid obesity Unchanged Patient re-educated  about  the importance of commitment to a  minimum of 150 minutes of exercise per week.  The importance of healthy food choices with portion control discussed. Encouraged to start a food diary, count calories and to consider  joining a support group. Sample diet sheets offered. Goals set by the patient for the next several months.   Weight /BMI 06/11/2014 05/23/2014 01/18/2014  WEIGHT 216 lb 218 lb 1.9 oz 225 lb  HEIGHT 5\' 3"  5\' 3"  5\' 3"   BMI 38.27 kg/m2 38.65 kg/m2 39.87 kg/m2    Current exercise per week 60 minutes.Currently reduced due to acute goout    Hyperuricemia Corrected,pt to start daily allopurinol  once acute flare completely corrected   Acute gout Acute flare, short copurse of indomethacin, along with prednisone

## 2014-06-14 ENCOUNTER — Other Ambulatory Visit: Payer: Self-pay | Admitting: Family Medicine

## 2014-06-14 DIAGNOSIS — Z1231 Encounter for screening mammogram for malignant neoplasm of breast: Secondary | ICD-10-CM

## 2014-06-29 NOTE — Assessment & Plan Note (Signed)
Controlled, no change in medication DASH diet and commitment to daily physical activity for a minimum of 30 minutes discussed and encouraged, as a part of hypertension management. The importance of attaining a healthy weight is also discussed.  BP/Weight 06/11/2014 05/23/2014 01/18/2014 01/10/2014 10/23/2013 8/0/2233 07/11/2242  Systolic BP 975 300 511 021 117 356 -  Diastolic BP 76 80 84 78 94 70 -  Wt. (Lbs) 216 218.12 225 223.5 222 226.4 224  BMI 38.27 38.65 39.87 40.2 39.93 41.4 40.96

## 2014-06-29 NOTE — Assessment & Plan Note (Signed)
Acute flare, short copurse of indomethacin, along with prednisone

## 2014-06-29 NOTE — Assessment & Plan Note (Signed)
Marked hyperuricemia, pt to resume allopurinol

## 2014-06-29 NOTE — Assessment & Plan Note (Signed)
Controlled, no change in medication DASH diet and commitment to daily physical activity for a minimum of 30 minutes discussed and encouraged, as a part of hypertension management. The importance of attaining a healthy weight is also discussed.  BP/Weight 06/11/2014 05/23/2014 01/18/2014 01/10/2014 10/23/2013 03/20/6379 08/16/1163  Systolic BP 790 383 338 329 191 660 -  Diastolic BP 76 80 84 78 94 70 -  Wt. (Lbs) 216 218.12 225 223.5 222 226.4 224  BMI 38.27 38.65 39.87 40.2 39.93 41.4 40.96

## 2014-06-29 NOTE — Assessment & Plan Note (Signed)
Updated lab needed at/ before next visit. Patient educated about the importance of limiting  Carbohydrate intake , the need to commit to daily physical activity for a minimum of 30 minutes , and to commit weight loss. T Diabetic Labs Latest Ref Rng 06/08/2014 05/21/2014 05/15/2014 01/24/2014 01/18/2014  HbA1c <5.7 % - - 7.2(H) 6.8(H) -  Microalbumin <2.0 mg/dL - - - - 1.1  Micro/Creat Ratio 0.0 - 30.0 mg/g - - - - 11.3  Chol 0 - 200 mg/dL - - 184 216(H) -  HDL >=46 mg/dL - - 34(L) 47 -  Calc LDL 0 - 99 mg/dL - - 122(H) 147(H) -  Triglycerides <150 mg/dL - - 141 108 -  Creatinine 0.50 - 1.10 mg/dL 1.03 1.32(H) 1.42(H) 1.04 -   BP/Weight 06/11/2014 05/23/2014 01/18/2014 01/10/2014 10/23/2013 03/20/209 02/13/5206  Systolic BP 022 336 122 449 753 005 -  Diastolic BP 76 80 84 78 94 70 -  Wt. (Lbs) 216 218.12 225 223.5 222 226.4 224  BMI 38.27 38.65 39.87 40.2 39.93 41.4 40.96   Foot/eye exam completion dates Latest Ref Rng 09/07/2013 07/10/2013  Eye Exam No Retinopathy - No Retinopathy  Foot Form Completion - Done -

## 2014-06-29 NOTE — Assessment & Plan Note (Signed)
Corrected,pt to start daily allopurinol once acute flare completely corrected

## 2014-06-29 NOTE — Assessment & Plan Note (Signed)
Unchanged Patient re-educated about  the importance of commitment to a  minimum of 150 minutes of exercise per week.  The importance of healthy food choices with portion control discussed. Encouraged to start a food diary, count calories and to consider  joining a support group. Sample diet sheets offered. Goals set by the patient for the next several months.   Weight /BMI 06/11/2014 05/23/2014 01/18/2014  WEIGHT 216 lb 218 lb 1.9 oz 225 lb  HEIGHT 5\' 3"  5\' 3"  5\' 3"   BMI 38.27 kg/m2 38.65 kg/m2 39.87 kg/m2    Current exercise per week 60 minutes.Currently reduced due to acute goout

## 2014-06-29 NOTE — Progress Notes (Signed)
Subjective:    Patient ID: Pamela Stein, female    DOB: 1955/08/01, 59 y.o.   MRN: [id number]  HPI 2 week h/o itchy rash on both hands, no known new products, and no known exposure to allergens Has been using neosporin  With no improvement Denies polyuria, polydipsia, blurred vision , or hypoglycemic episodes. Otherwise has  been well   Review of Systems See HPI Denies recent fever or chills. Denies sinus pressure, nasal congestion, ear pain or sore throat. Denies chest congestion, productive cough or wheezing. Denies chest pains, palpitations and leg swelling Denies abdominal pain, nausea, vomiting,diarrhea or constipation.   Denies dysuria, frequency, hesitancy or incontinence. Denies joint pain, swelling and limitation in mobility. Denies headaches, seizures, numbness, or tingling. Denies depression, anxiety or insomnia.        Objective:   Physical Exam  BP 120/80 mmHg  Pulse 66  Resp 16  Ht 5\' 3"  (1.6 m)  Wt 218 lb 1.9 oz (98.939 kg)  BMI 38.65 kg/m2  SpO2 100% Patient alert and oriented and in no cardiopulmonary distress.  HEENT: No facial asymmetry, EOMI,   oropharynx pink and moist.  Neck supple no JVD, no mass.  Chest: Clear to auscultation bilaterally.  CVS: S1, S2 no murmurs, no S3.Regular rate.  ABD: Soft non tender.   Ext: No edema  MS: Adequate ROM spine, shoulders, hips and knees.  Skin: erythema and scaling rash on both hands Psych: Good eye contact, normal affect. Memory intact not anxious or depressed appearing.  CNS: CN 2-12 intact, power,  normal throughout.no focal deficits noted.       Assessment & Plan:  Dermatitis due to unknown cause 2 week h/o itchy rash on both hands, no fever, redness or purulent drainage Depo medrol in office then steroid dose pack, benadryl at night   Essential hypertension Controlled, no change in medication DASH diet and commitment to daily physical activity for a minimum of 30 minutes  discussed and encouraged, as a part of hypertension management. The importance of attaining a healthy weight is also discussed.  BP/Weight 06/11/2014 05/23/2014 01/18/2014 01/10/2014 10/23/2013 4/0/9811 10/10/4780  Systolic BP 956 213 086 578 469 629 -  Diastolic BP 76 80 84 78 94 70 -  Wt. (Lbs) 216 218.12 225 223.5 222 226.4 224  BMI 38.27 38.65 39.87 40.2 39.93 41.4 40.96         Diabetes mellitus Updated lab needed at/ before next visit. Patient educated about the importance of limiting  Carbohydrate intake , the need to commit to daily physical activity for a minimum of 30 minutes , and to commit weight loss. T Diabetic Labs Latest Ref Rng 06/08/2014 05/21/2014 05/15/2014 01/24/2014 01/18/2014  HbA1c <5.7 % - - 7.2(H) 6.8(H) -  Microalbumin <2.0 mg/dL - - - - 1.1  Micro/Creat Ratio 0.0 - 30.0 mg/g - - - - 11.3  Chol 0 - 200 mg/dL - - 184 216(H) -  HDL >=46 mg/dL - - 34(L) 47 -  Calc LDL 0 - 99 mg/dL - - 122(H) 147(H) -  Triglycerides <150 mg/dL - - 141 108 -  Creatinine 0.50 - 1.10 mg/dL 1.03 1.32(H) 1.42(H) 1.04 -   BP/Weight 06/11/2014 05/23/2014 01/18/2014 01/10/2014 10/23/2013 06/11/8411 03/15/4008  Systolic BP 272 536 644 034 742 595 -  Diastolic BP 76 80 84 78 94 70 -  Wt. (Lbs) 216 218.12 225 223.5 222 226.4 224  BMI 38.27 38.65 39.87 40.2 39.93 41.4 40.96   Foot/eye exam completion dates  Latest Ref Rng 09/07/2013 07/10/2013  Eye Exam No Retinopathy - No Retinopathy  Foot Form Completion - Done -        Hyperuricemia Marked hyperuricemia, pt to resume allopurinol

## 2014-06-29 NOTE — Assessment & Plan Note (Addendum)
Improved Patient educated about the importance of limiting  Carbohydrate intake , the need to commit to daily physical activity for a minimum of 30 minutes , and to commit weight loss. The fact that changes in all these areas will reduce complications from diabetes is stressed.   Diabetic Labs Latest Ref Rng 06/08/2014 05/21/2014 05/15/2014 01/24/2014 01/18/2014  HbA1c <5.7 % - - 7.2(H) 6.8(H) -  Microalbumin <2.0 mg/dL - - - - 1.1  Micro/Creat Ratio 0.0 - 30.0 mg/g - - - - 11.3  Chol 0 - 200 mg/dL - - 184 216(H) -  HDL >=46 mg/dL - - 34(L) 47 -  Calc LDL 0 - 99 mg/dL - - 122(H) 147(H) -  Triglycerides <150 mg/dL - - 141 108 -  Creatinine 0.50 - 1.10 mg/dL 1.03 1.32(H) 1.42(H) 1.04 -   BP/Weight 06/11/2014 05/23/2014 01/18/2014 01/10/2014 10/23/2013 08/17/381 04/12/8327  Systolic BP 191 660 600 459 977 414 -  Diastolic BP 76 80 84 78 94 70 -  Wt. (Lbs) 216 218.12 225 223.5 222 226.4 224  BMI 38.27 38.65 39.87 40.2 39.93 41.4 40.96   Foot/eye exam completion dates Latest Ref Rng 09/07/2013 07/10/2013  Eye Exam No Retinopathy - No Retinopathy  Foot Form Completion - Done -

## 2014-07-03 ENCOUNTER — Other Ambulatory Visit: Payer: Self-pay

## 2014-07-03 MED ORDER — PRAVASTATIN SODIUM 80 MG PO TABS: 80.0000 mg | ORAL_TABLET | Freq: Every evening | ORAL | Status: DC

## 2014-07-16 ENCOUNTER — Ambulatory Visit (HOSPITAL_COMMUNITY)
Admission: RE | Admit: 2014-07-16 | Discharge: 2014-07-16 | Disposition: A | Discharge status: Home / Self Care | Payer: Federal, State, Local not specified - PPO | Source: Ambulatory Visit | Attending: Family Medicine | Admitting: Family Medicine

## 2014-07-16 DIAGNOSIS — Z1231 Encounter for screening mammogram for malignant neoplasm of breast: Secondary | ICD-10-CM | POA: Diagnosis present

## 2014-07-16 IMAGING — MG MM DIGITAL SCREENING BILAT W/ CAD
4 series · 4 of 4 positions shown · non-contrast
Comparison: Previous exam(s).

ACR Breast Density Category a: The breast tissue is almost entirely
fatty.

CLINICAL DATA: Screening.

EXAM:
DIGITAL SCREENING BILATERAL MAMMOGRAM WITH CAD

[R CC]
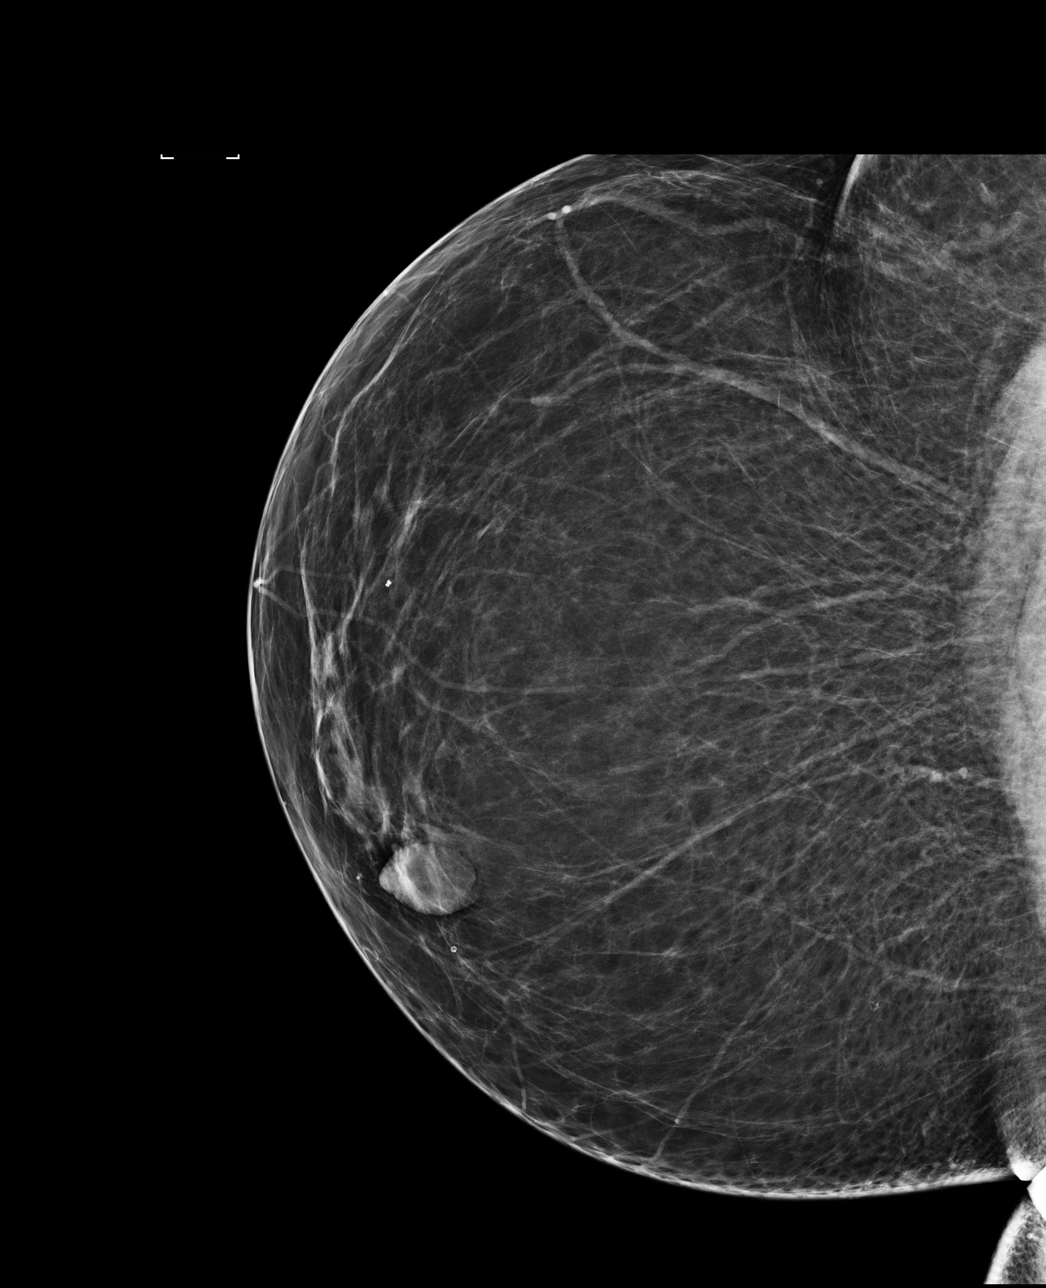

[L MLO]
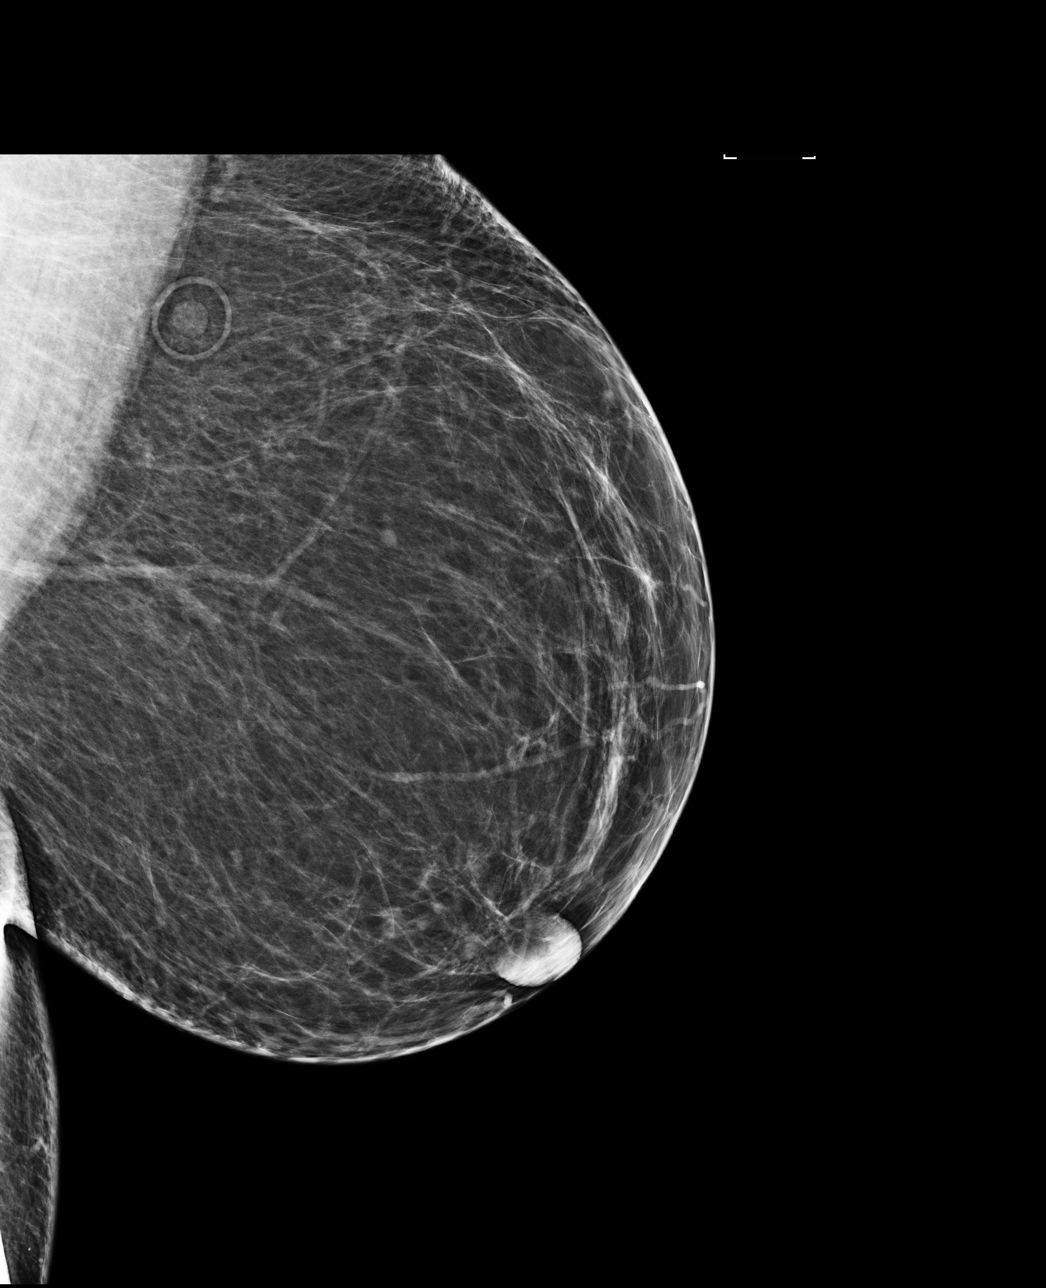

[R MLO]
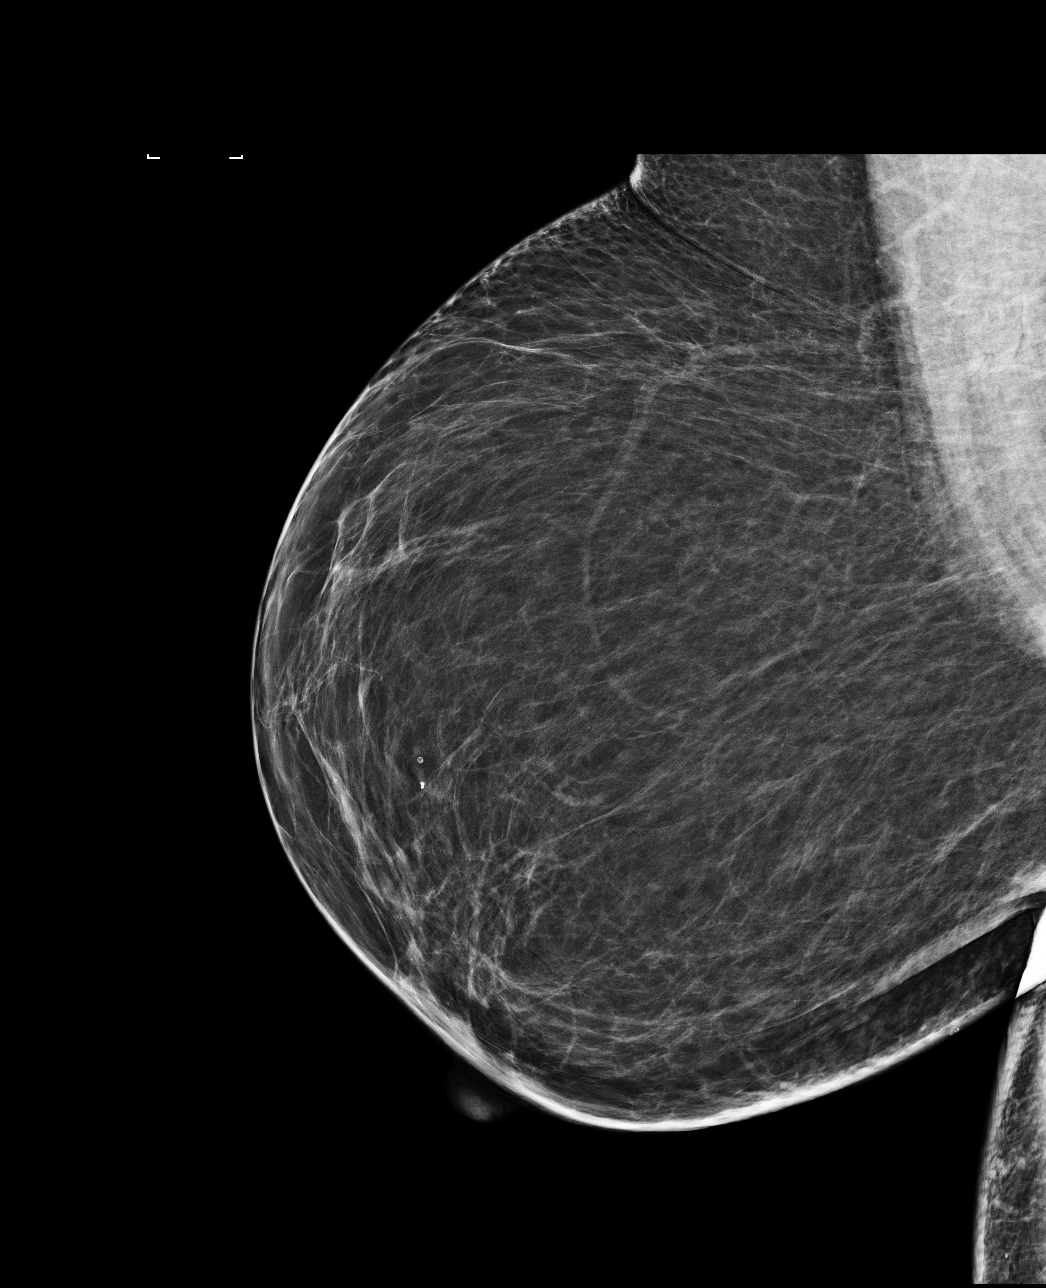

[L CC]
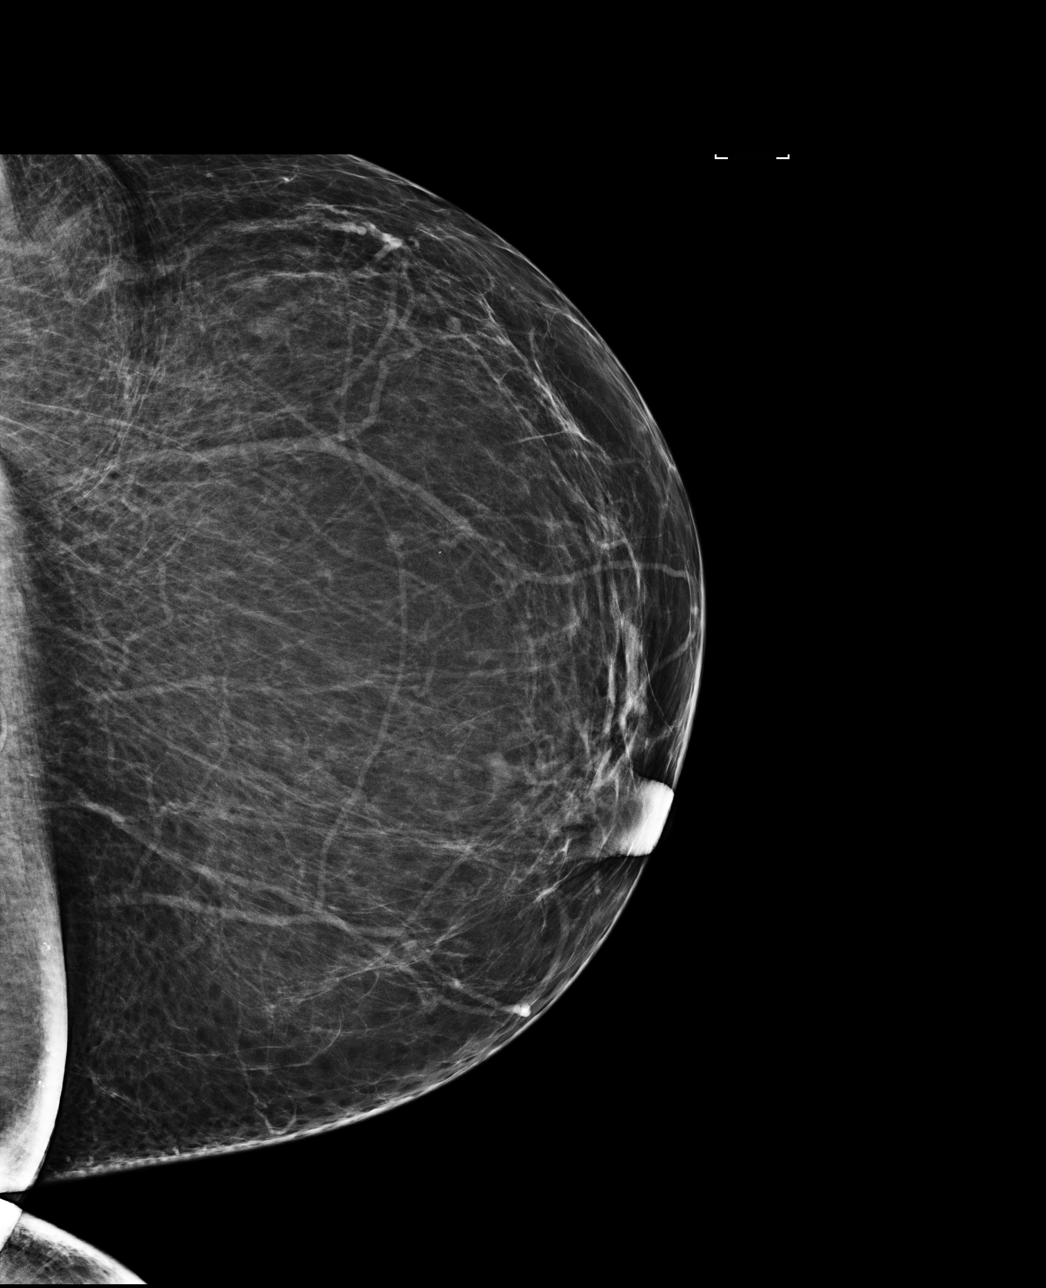

[4 of 4 positions shown; findings below may reference images not displayed]

FINDINGS: There are no findings suspicious for malignancy. Images were
processed with CAD.
IMPRESSION: No mammographic evidence of malignancy. A result letter of this
screening mammogram will be mailed directly to the patient.

RECOMMENDATION:
Screening mammogram in one year. (Code:[0V])

BI-RADS CATEGORY  1: Negative.

## 2014-07-31 ENCOUNTER — Ambulatory Visit (INDEPENDENT_AMBULATORY_CARE_PROVIDER_SITE_OTHER): Payer: Federal, State, Local not specified - PPO | Admitting: Internal Medicine

## 2014-07-31 ENCOUNTER — Encounter: Payer: Self-pay | Admitting: Internal Medicine

## 2014-07-31 VITALS — BP 122/72 | HR 74 | Ht 62.0 in | Wt 208.0 lb

## 2014-07-31 DIAGNOSIS — G4733 Obstructive sleep apnea (adult) (pediatric): Secondary | ICD-10-CM | POA: Diagnosis not present

## 2014-07-31 NOTE — Assessment & Plan Note (Signed)
She had done well with CPAP until she broke her mask. She has also managed to lose weight in the last few years. She is comfortable continuing with CPAP but we will need to reassess pressures. Plan-[facility] replace CPAP, updating equipment, auto titration to start.

## 2014-07-31 NOTE — Progress Notes (Signed)
07/31/14- 59 yo F never smoker Follows For: pt here to discuss getting a new mask for CPAP.  EPworth score: 7 Last here 2009. Had tried UPPP/ Septoplasty, then went back to CPAP.  PCP Dr Jerilynn Mages.Simpson NPSG 01/12/05- mild OSA, AHI 12.8/ hr, weight 247 lbs PFT 10/30/08-  She broke her CPAP mask one year ago and [facility] told her she needs to be seen to update documentation for replacement. Was sleeping well with CPAP and not told of snoring, lives alone.. She has lost weight and is dieting, trying to lose more, down from around 220- 208. Denies heart or lung disease but treated for hypertension, diabetes.  Prior to Admission medications   Medication Sig Start Date End Date Taking? Authorizing Provider  ACCU-CHEK AVIVA PLUS test strip USE AS DIRECTED TO TEST TWICE DAILY   Yes Fayrene Helper, MD  ACCU-CHEK SOFTCLIX LANCETS lancets USE TO DRAW BLOOD FOR GLUCOSE TEST 12/05/10  Yes Fayrene Helper, MD  amLODipine (NORVASC) 10 MG tablet Take 1 tablet (10 mg total) by mouth daily. 05/16/14  Yes Fayrene Helper, MD  aspirin 81 MG tablet Take 81 mg by mouth daily.     Yes Historical Provider, MD  b complex vitamins tablet Take 1 tablet by mouth daily.   Yes Historical Provider, MD  hydrOXYzine (ATARAX/VISTARIL) 25 MG tablet One tablet at bedtime for itching 05/23/14  Yes Fayrene Helper, MD  metFORMIN (GLUCOPHAGE) 500 MG tablet Take 1 tablet (500 mg total) by mouth 2 (two) times daily with a meal. 05/02/14  Yes Fayrene Helper, MD  multivitamin [facility]) per tablet Take 1 tablet by mouth daily.     Yes Historical Provider, MD  pravastatin (PRAVACHOL) 80 MG tablet Take 1 tablet (80 mg total) by mouth every evening. 07/03/14  Yes Fayrene Helper, MD  allopurinol (ZYLOPRIM) 300 MG tablet Take 1 tablet (300 mg total) by mouth daily. Patient not taking: Reported on 07/31/2014 05/16/14   Fayrene Helper, MD   Past Medical History  Diagnosis Date  . Diabetes mellitus   . Hyperlipidemia   . Hypertension    . Obesity   . Obstructive sleep apnea   . Back pain    Past Surgical History  Procedure Laterality Date  . Abdominal hysterectomy    . Dilation and curettage of uterus    . Endometrial ablation    . Tonsillectomy    . Cataract extraction Left   . Eye surgery Left 12/04/2013    cataract   Family History  Problem Relation Age of Onset  . Cancer Father     throat  . Diabetes Maternal Grandmother   . Hypertension Maternal Grandfather   . Hypertension Paternal Grandmother   . Diabetes Paternal Grandfather    History   Social History  . Marital Status: Single    Spouse Name: N/A  . Number of Children: N/A  . Years of Education: N/A   Occupational History  . Parts clerk    Social History Main Topics  . Smoking status: Never Smoker   . Smokeless tobacco: Never Used  . Alcohol Use: No  . Drug Use: No  . Sexual Activity: Not Currently    Birth Control/ Protection: Surgical   Other Topics Concern  . Not on file   Social History Narrative   ROS-see HPI   Negative unless "+" Constitutional:    + Diet weight loss, night sweats, fevers, chills, fatigue, lassitude. HEENT:    headaches, difficulty swallowing, tooth/dental problems,  sore throat,       sneezing, itching, ear ache, nasal congestion, post nasal drip, snoring CV:    chest pain, orthopnea, PND, swelling in lower extremities, anasarca,                                  dizziness, palpitations Resp:   shortness of breath with exertion or at rest.                productive cough,   non-productive cough, coughing up of blood.              change in color of mucus.  wheezing.   Skin:    +rash or lesions. GI:  No-   heartburn, indigestion, abdominal pain, nausea, vomiting, diarrhea,                 change in bowel habits, loss of appetite GU: dysuria, change in color of urine, no urgency or frequency.   flank pain. MS:   joint pain, stiffness, decreased range of motion, back pain. Neuro-     nothing unusual Psych:   change in mood or affect.  depression or anxiety.   memory loss.  OBJ- Physical Exam General- Alert, Oriented, Affect-appropriate, Distress- none acute,                   + overweight Skin- rash-none, lesions- none, excoriation- none Lymphadenopathy- none Head- atraumatic            Eyes- Gross vision intact, PERRLA, conjunctivae and secretions clear            Ears- Hearing, canals-normal            Nose- Clear, no-Septal dev, mucus, polyps, erosion, perforation             Throat- Mallampati III-UPPP , mucosa clear , drainage- none, tonsils- atrophic Neck- flexible , trachea midline, no stridor , thyroid nl, carotid no bruit Chest - symmetrical excursion , unlabored           Heart/CV- RRR , no murmur , no gallop  , no rub, nl s1 s2                           - JVD- none , edema- none, stasis changes- none, varices- none           Lung- clear to P&A, wheeze- none, cough- none , dullness-none, rub- none           Chest wall-  Abd-  Br/ Gen/ Rectal- Not done, not indicated Extrem- cyanosis- none, clubbing, none, atrophy- none, strength- nl Neuro- grossly intact to observation

## 2014-07-31 NOTE — Patient Instructions (Signed)
Order- DME [facility]- replacement for old CPAP autotitration 5-15,  machine, mask of choice, humidifier, supplies, AirView if available, download for pressure compliance Dx  OSA

## 2014-09-12 ENCOUNTER — Other Ambulatory Visit: Payer: Self-pay | Admitting: Family Medicine

## 2014-09-12 ENCOUNTER — Ambulatory Visit (INDEPENDENT_AMBULATORY_CARE_PROVIDER_SITE_OTHER): Payer: Federal, State, Local not specified - PPO | Admitting: Family Medicine

## 2014-09-12 ENCOUNTER — Encounter: Payer: Self-pay | Admitting: Family Medicine

## 2014-09-12 VITALS — BP 120/80 | HR 74 | Resp 18 | Ht 63.0 in | Wt 210.0 lb

## 2014-09-12 DIAGNOSIS — E119 Type 2 diabetes mellitus without complications: Secondary | ICD-10-CM

## 2014-09-12 DIAGNOSIS — Z23 Encounter for immunization: Secondary | ICD-10-CM | POA: Diagnosis not present

## 2014-09-12 DIAGNOSIS — E790 Hyperuricemia without signs of inflammatory arthritis and tophaceous disease: Secondary | ICD-10-CM | POA: Diagnosis not present

## 2014-09-12 DIAGNOSIS — E669 Obesity, unspecified: Secondary | ICD-10-CM

## 2014-09-12 DIAGNOSIS — I10 Essential (primary) hypertension: Secondary | ICD-10-CM | POA: Diagnosis not present

## 2014-09-12 DIAGNOSIS — G4733 Obstructive sleep apnea (adult) (pediatric): Secondary | ICD-10-CM

## 2014-09-12 DIAGNOSIS — E1169 Type 2 diabetes mellitus with other specified complication: Secondary | ICD-10-CM

## 2014-09-12 DIAGNOSIS — E785 Hyperlipidemia, unspecified: Secondary | ICD-10-CM | POA: Diagnosis not present

## 2014-09-12 DIAGNOSIS — E662 Morbid (severe) obesity with alveolar hypoventilation: Secondary | ICD-10-CM

## 2014-09-12 LAB — COMPLETE METABOLIC PANEL WITH GFR
ALBUMIN: 4 g/dL (ref 3.6–5.1)
ALT: 13 U/L (ref 6–29)
AST: 14 U/L (ref 10–35)
Alkaline Phosphatase: 78 U/L (ref 33–130)
BUN: 22 mg/dL (ref 7–25)
CALCIUM: 10.5 mg/dL — AB (ref 8.6–10.4)
CO2: 26 mmol/L (ref 20–31)
Chloride: 101 mmol/L (ref 98–110)
Creat: 1.17 mg/dL — ABNORMAL HIGH (ref 0.50–0.99)
GFR, EST NON AFRICAN AMERICAN: 51 mL/min — AB (ref >=60)
GFR, Est African American: 59 mL/min — ABNORMAL LOW (ref >=60)
Glucose, Bld: 100 mg/dL — ABNORMAL HIGH (ref 65–99)
Potassium: 4.2 mmol/L (ref 3.5–5.3)
Sodium: 138 mmol/L (ref 135–146)
Total Bilirubin: 0.5 mg/dL (ref 0.2–1.2)
Total Protein: 7.6 g/dL (ref 6.1–8.1)

## 2014-09-12 LAB — HEMOGLOBIN A1C
HEMOGLOBIN A1C: 6.9 % — AB (ref <5.7)
Mean Plasma Glucose: 151 mg/dL — ABNORMAL HIGH (ref <117)

## 2014-09-12 LAB — LIPID PANEL
CHOLESTEROL: 254 mg/dL — AB (ref 125–200)
HDL: 42 mg/dL — AB (ref >=46)
LDL Cholesterol: 178 mg/dL — ABNORMAL HIGH (ref <130)
Total CHOL/HDL Ratio: 6 Ratio — ABNORMAL HIGH (ref <=5.0)
Triglycerides: 170 mg/dL — ABNORMAL HIGH (ref <150)
VLDL: 34 mg/dL — ABNORMAL HIGH (ref <30)

## 2014-09-12 LAB — URIC ACID: Uric Acid, Serum: 6 mg/dL (ref 2.4–7.0)

## 2014-09-12 NOTE — Assessment & Plan Note (Signed)
Controlled, no change in medication DASH diet and commitment to daily physical activity for a minimum of 30 minutes discussed and encouraged, as a part of hypertension management. The importance of attaining a healthy weight is also discussed.  BP/Weight 09/12/2014 07/31/2014 06/11/2014 05/23/2014 01/18/2014 01/10/2014 3/34/3568  Systolic BP 616 837 290 211 155 208 022  Diastolic BP 80 72 76 80 84 78 94  Wt. (Lbs) 210.04 208 216 218.12 225 223.5 222  BMI 37.22 38.03 38.27 38.65 39.87 40.2 39.93

## 2014-09-12 NOTE — Patient Instructions (Signed)
F/u in 4 month, call if you need me before  Lipid, cmp and EGFR, HBA1C and uric acid level today   CONGRATS on excellent blood pressure and 10 pound weight loss  Foot exam today  Please work on good  health habits so that your health will improve. 1. Commitment to daily physical activity for 30 to 60  minutes, if you are able to do this.  2. Commitment to wise food choices. Aim for half of your  food intake to be vegetable and fruit, one quarter starchy foods, and one quarter protein. Try to eat on a regular schedule  3 meals per day, snacking between meals should be limited to vegetables or fruits or small portions of nuts. 64 ounces of water per day is generally recommended, unless you have specific health conditions, like heart failure or kidney failure where you will need to limit fluid intake.  3. Commitment to sufficient and a  good quality of physical and mental rest daily, generally between 6 to 8 hours per day.  WITH PERSISTANCE AND PERSEVERANCE, THE IMPOSSIBLE , BECOMES THE NORM! Thanks for choosing [facility], we consider it a privelige to serve you.

## 2014-09-14 LAB — HIV ANTIBODY (ROUTINE TESTING W REFLEX): HIV: NONREACTIVE

## 2014-10-31 ENCOUNTER — Ambulatory Visit: Payer: Self-pay | Admitting: Internal Medicine

## 2014-11-08 ENCOUNTER — Ambulatory Visit: Payer: Self-pay | Admitting: Internal Medicine

## 2014-11-11 NOTE — Assessment & Plan Note (Signed)
Pt reminded of the importance of strict compliance with treatment for improved overall health

## 2014-11-11 NOTE — Progress Notes (Signed)
Pamela Stein     MRN: [id number]      DOB: [birth year]   HPI Pamela Stein is here for follow up and re-evaluation of chronic medical conditions, medication management and review of any available recent lab and radiology data.  Preventive health is updated, specifically  Cancer screening and Immunization.   Questions or concerns regarding consultations or procedures which the PT has had in the interim are  addressed. The PT denies any adverse reactions to current medications since the last visit.  There are no new concerns.  There are no specific complaints   ROS Denies recent fever or chills. Denies sinus pressure, nasal congestion, ear pain or sore throat. Denies chest congestion, productive cough or wheezing. Denies chest pains, palpitations and leg swelling Denies abdominal pain, nausea, vomiting,diarrhea or constipation.   Denies dysuria, frequency, hesitancy or incontinence. Denies joint pain, swelling and limitation in mobility. Denies headaches, seizures, numbness, or tingling. Denies depression, anxiety or insomnia. Denies skin break down or rash.   PE  BP 120/80 mmHg  Pulse 74  Resp 18  Ht 5\' 3"  (1.6 m)  Wt 210 lb 0.6 oz (95.274 kg)  BMI 37.22 kg/m2  SpO2 96%  Patient alert and oriented and in no cardiopulmonary distress.  HEENT: No facial asymmetry, EOMI,   oropharynx pink and moist.  Neck supple no JVD, no mass.  Chest: Clear to auscultation bilaterally.  CVS: S1, S2 no murmurs, no S3.Regular rate.  ABD: Soft non tender.   Ext: No edema  MS: Adequate ROM spine, shoulders, hips and knees.  Skin: Intact, no ulcerations or rash noted.  Psych: Good eye contact, normal affect. Memory intact not anxious or depressed appearing.  CNS: CN 2-12 intact, power,  normal throughout.no focal deficits noted.   Assessment & Plan   Essential hypertension Controlled, no change in medication DASH diet and commitment to daily physical activity for a minimum of  30 minutes discussed and encouraged, as a part of hypertension management. The importance of attaining a healthy weight is also discussed.  BP/Weight 09/12/2014 07/31/2014 06/11/2014 05/23/2014 01/18/2014 01/10/2014 0/27/7412  Systolic BP 878 676 720 947 096 283 662  Diastolic BP 80 72 76 80 84 78 94  Wt. (Lbs) 210.04 208 216 218.12 225 223.5 222  BMI 37.22 38.03 38.27 38.65 39.87 40.2 39.93        Diabetes mellitus Controlled, no change in medication Pamela Stein is reminded of the importance of commitment to daily physical activity for 30 minutes or more, as able and the need to limit carbohydrate intake to 30 to 60 grams per meal to help with blood sugar control.   The need to take medication as prescribed, test blood sugar as directed, and to call between visits if there is a concern that blood sugar is uncontrolled is also discussed.   Pamela Stein is reminded of the importance of daily foot exam, annual eye examination, and good blood sugar, blood pressure and cholesterol control.  Diabetic Labs Latest Ref Rng 09/12/2014 06/08/2014 05/21/2014 05/15/2014 01/24/2014  HbA1c <5.7 % 6.9(H) - - 7.2(H) 6.8(H)  Microalbumin <2.0 mg/dL - - - - -  Micro/Creat Ratio 0.0 - 30.0 mg/g - - - - -  Chol 125 - 200 mg/dL 254(H) - - 184 216(H)  HDL >=46 mg/dL 42(L) - - 34(L) 47  Calc LDL <130 mg/dL 178(H) - - 122(H) 147(H)  Triglycerides <150 mg/dL 170(H) - - 141 108  Creatinine 0.50 - 0.99 mg/dL 1.17(H) 1.03 1.32(H)  1.42(H) 1.04   BP/Weight 09/12/2014 07/31/2014 06/11/2014 05/23/2014 01/18/2014 01/10/2014 9/62/9528  Systolic BP 413 244 010 272 536 644 034  Diastolic BP 80 72 76 80 84 78 94  Wt. (Lbs) 210.04 208 216 218.12 225 223.5 222  BMI 37.22 38.03 38.27 38.65 39.87 40.2 39.93   Foot/eye exam completion dates Latest Ref Rng 09/12/2014 09/07/2013  Eye Exam No Retinopathy - -  Foot Form Completion - Done Done         Morbid obesity Improved Patient re-educated about  the importance of commitment to a   minimum of 150 minutes of exercise per week.  The importance of healthy food choices with portion control discussed. Encouraged to start a food diary, count calories and to consider  joining a support group. Sample diet sheets offered. Goals set by the patient for the next several months.   Weight /BMI 09/12/2014 07/31/2014 06/11/2014  WEIGHT 210 lb 0.6 oz 208 lb 216 lb  HEIGHT 5\' 3"  5\' 2"  5\' 3"   BMI 37.22 kg/m2 38.03 kg/m2 38.27 kg/m2    Current exercise per week 90 minutes.   Hyperlipidemia LDL goal <100 Hyperlipidemia:Low fat diet discussed and encouraged.   Lipid Panel  Lab Results  Component Value Date   CHOL 254* 09/12/2014   HDL 42* 09/12/2014   LDLCALC 178* 09/12/2014   TRIG 170* 09/12/2014   CHOLHDL 6.0* 09/12/2014    Uncontrolled due to non compliance, pt to resume meds and lower fat intake  \  Obstructive sleep apnea Pt reminded of the importance of strict compliance with treatment for improved overall health

## 2014-11-11 NOTE — Assessment & Plan Note (Signed)
Hyperlipidemia:Low fat diet discussed and encouraged.   Lipid Panel  Lab Results  Component Value Date   CHOL 254* 09/12/2014   HDL 42* 09/12/2014   LDLCALC 178* 09/12/2014   TRIG 170* 09/12/2014   CHOLHDL 6.0* 09/12/2014    Uncontrolled due to non compliance, pt to resume meds and lower fat intake  \

## 2014-11-11 NOTE — Assessment & Plan Note (Signed)
Controlled, no change in medication Pamela Stein is reminded of the importance of commitment to daily physical activity for 30 minutes or more, as able and the need to limit carbohydrate intake to 30 to 60 grams per meal to help with blood sugar control.   The need to take medication as prescribed, test blood sugar as directed, and to call between visits if there is a concern that blood sugar is uncontrolled is also discussed.   Pamela Stein is reminded of the importance of daily foot exam, annual eye examination, and good blood sugar, blood pressure and cholesterol control.  Diabetic Labs Latest Ref Rng 09/12/2014 06/08/2014 05/21/2014 05/15/2014 01/24/2014  HbA1c <5.7 % 6.9(H) - - 7.2(H) 6.8(H)  Microalbumin <2.0 mg/dL - - - - -  Micro/Creat Ratio 0.0 - 30.0 mg/g - - - - -  Chol 125 - 200 mg/dL 254(H) - - 184 216(H)  HDL >=46 mg/dL 42(L) - - 34(L) 47  Calc LDL <130 mg/dL 178(H) - - 122(H) 147(H)  Triglycerides <150 mg/dL 170(H) - - 141 108  Creatinine 0.50 - 0.99 mg/dL 1.17(H) 1.03 1.32(H) 1.42(H) 1.04   BP/Weight 09/12/2014 07/31/2014 06/11/2014 05/23/2014 01/18/2014 01/10/2014 5/36/1443  Systolic BP 154 008 676 195 093 267 124  Diastolic BP 80 72 76 80 84 78 94  Wt. (Lbs) 210.04 208 216 218.12 225 223.5 222  BMI 37.22 38.03 38.27 38.65 39.87 40.2 39.93   Foot/eye exam completion dates Latest Ref Rng 09/12/2014 09/07/2013  Eye Exam No Retinopathy - -  Foot Form Completion - Done Done

## 2014-11-11 NOTE — Assessment & Plan Note (Signed)
Improved Patient re-educated about  the importance of commitment to a  minimum of 150 minutes of exercise per week.  The importance of healthy food choices with portion control discussed. Encouraged to start a food diary, count calories and to consider  joining a support group. Sample diet sheets offered. Goals set by the patient for the next several months.   Weight /BMI 09/12/2014 07/31/2014 06/11/2014  WEIGHT 210 lb 0.6 oz 208 lb 216 lb  HEIGHT 5\' 3"  5\' 2"  5\' 3"   BMI 37.22 kg/m2 38.03 kg/m2 38.27 kg/m2    Current exercise per week 90 minutes.

## 2015-01-15 ENCOUNTER — Ambulatory Visit (INDEPENDENT_AMBULATORY_CARE_PROVIDER_SITE_OTHER): Payer: Federal, State, Local not specified - PPO | Admitting: Family Medicine

## 2015-01-15 ENCOUNTER — Encounter: Payer: Self-pay | Admitting: Family Medicine

## 2015-01-15 VITALS — BP 154/80 | HR 82 | Resp 18 | Ht 63.0 in | Wt 221.0 lb

## 2015-01-15 DIAGNOSIS — F329 Major depressive disorder, single episode, unspecified: Secondary | ICD-10-CM

## 2015-01-15 DIAGNOSIS — Z1159 Encounter for screening for other viral diseases: Secondary | ICD-10-CM

## 2015-01-15 DIAGNOSIS — E559 Vitamin D deficiency, unspecified: Secondary | ICD-10-CM

## 2015-01-15 DIAGNOSIS — E785 Hyperlipidemia, unspecified: Secondary | ICD-10-CM | POA: Diagnosis not present

## 2015-01-15 DIAGNOSIS — E790 Hyperuricemia without signs of inflammatory arthritis and tophaceous disease: Secondary | ICD-10-CM

## 2015-01-15 DIAGNOSIS — I10 Essential (primary) hypertension: Secondary | ICD-10-CM

## 2015-01-15 DIAGNOSIS — E1169 Type 2 diabetes mellitus with other specified complication: Secondary | ICD-10-CM | POA: Diagnosis not present

## 2015-01-15 DIAGNOSIS — Z23 Encounter for immunization: Secondary | ICD-10-CM | POA: Diagnosis not present

## 2015-01-15 DIAGNOSIS — F32A Depression, unspecified: Secondary | ICD-10-CM | POA: Insufficient documentation

## 2015-01-15 MED ORDER — FLUOXETINE HCL (PMDD) 10 MG PO TABS: ORAL_TABLET | ORAL | Status: DC

## 2015-01-15 NOTE — Patient Instructions (Addendum)
F/u in 7 weeks, call if you need me before  Labs fasting are past due pls get as soon as possible  New for depression is fluoxetine one daily  Blood pressure is high today, no changes are being made in medication doses at this time  PLEASE STOP EATING ICE CREAM AS COMFORT FOOD  All the best  [facility] you start feeling better soon

## 2015-01-15 NOTE — Progress Notes (Signed)
Subjective:    Patient ID: Pamela Stein, female    DOB: Oct 07, 1955, 59 y.o.   MRN: [id number]  HPI   Pamela Stein     MRN: [id number]      DOB: 1956/01/17   HPI Pamela Stein is here for follow up and re-evaluation of chronic medical conditions, medication management and review of any available recent lab and radiology data.  Preventive health is updated, specifically  Cancer screening and Immunization.   Questions or concerns regarding consultations or procedures which the PT has had in the interim are  addressed. The PT denies any adverse reactions to current medications since the last visit.  Overeating ice cream as a comfort food, with weight gain and deterioration in blood sugar control  ROS Denies recent fever or chills. Denies sinus pressure, nasal congestion, ear pain or sore throat. Denies chest congestion, productive cough or wheezing. Denies chest pains, palpitations and leg swelling Denies abdominal pain, nausea, vomiting,diarrhea or constipation.   Denies dysuria, frequency, hesitancy or incontinence. Denies joint pain, swelling and limitation in mobility. Denies headaches, seizures, numbness, or tingling. C/o increased depression, anxiety and some insomnia.Relates increased stress on the job negatively affecting her mental health Denies skin break down or rash.   PE  BP 154/80 mmHg  Pulse 82  Resp 18  Ht 5\' 3"  (1.6 m)  Wt 221 lb (100.245 kg)  BMI 39.16 kg/m2  SpO2 98%  Patient alert and oriented and in no cardiopulmonary distress.  HEENT: No facial asymmetry, EOMI,   oropharynx pink and moist.  Neck supple no JVD, no mass.  Chest: Clear to auscultation bilaterally.  CVS: S1, S2 no murmurs, no S3.Regular rate.  ABD: Soft non tender.   Ext: No edema  MS: Adequate ROM spine, shoulders, hips and knees.  Skin: Intact, no ulcerations or rash noted.  Psych: Good eye contact,tearful  affect. Memory intact both anxious and  depressed  appearing.  CNS: CN 2-12 intact, power,  normal throughout.no focal deficits noted.   Assessment & Plan  Essential hypertension Uncontrolled at visit. No med change, will hope that change in lifestyle and weight loss will address this, pt wishes to pursue this option first DASH diet and commitment to daily physical activity for a minimum of 30 minutes discussed and encouraged, as a part of hypertension management. The importance of attaining a healthy weight is also discussed.  BP/Weight 01/15/2015 09/12/2014 07/31/2014 06/11/2014 05/23/2014 01/18/2014 99991111  Systolic BP 123456 123456 [id number] 123456 123456 0000000  Diastolic BP 80 80 72 76 80 84 78  Wt. (Lbs) 221 210.04 208 216 218.12 225 223.5  BMI 39.16 37.22 38.03 38.27 38.65 39.87 40.2        Diabetes mellitus Updated lab needed at/ before next visit. Pamela Stein is reminded of the importance of commitment to daily physical activity for 30 minutes or more, as able and the need to limit carbohydrate intake to 30 to 60 grams per meal to help with blood sugar control.   The need to take medication as prescribed, test blood sugar as directed, and to call between visits if there is a concern that blood sugar is uncontrolled is also discussed.   Pamela Stein is reminded of the importance of daily foot exam, annual eye examination, and good blood sugar, blood pressure and cholesterol control. Likley deteriorated as pt has  Gained weight and is eating an excessive number of carbs admittedly  Diabetic Labs Latest Ref Rng 09/12/2014 06/08/2014 05/21/2014 05/15/2014 01/24/2014  HbA1c <5.7 % 6.9(H) - - 7.2(H) 6.8(H)  Microalbumin <2.0 mg/dL - - - - -  Micro/Creat Ratio 0.0 - 30.0 mg/g - - - - -  Chol 125 - 200 mg/dL 254(H) - - 184 216(H)  HDL >=46 mg/dL 42(L) - - 34(L) 47  Calc LDL <130 mg/dL 178(H) - - 122(H) 147(H)  Triglycerides <150 mg/dL 170(H) - - 141 108  Creatinine 0.50 - 0.99 mg/dL 1.17(H) 1.03 1.32(H) 1.42(H) 1.04   BP/Weight 01/15/2015 09/12/2014  07/31/2014 06/11/2014 05/23/2014 01/18/2014 [id number]  Systolic BP 123456 123456 [id number] 123456 123456 0000000  Diastolic BP 80 80 72 76 80 84 78  Wt. (Lbs) 221 210.04 208 216 218.12 225 223.5  BMI 39.16 37.22 38.03 38.27 38.65 39.87 40.2   Foot/eye exam completion dates Latest Ref Rng 09/12/2014 09/07/2013  Eye Exam No Retinopathy - -  Foot Form Completion - Done Done         Depression Not suicidal or homicidal but positive depression screen with tearful affect Start SSRI, , counseling also recommended however she declines at this time Stress is primarily work related, but she also reports concern about an elderly Aunt  Hyperlipidemia LDL goal <100 Uncontrolled Updated lab needed at/ before next visit. Hyperlipidemia:Low fat diet discussed and encouraged.   Lipid Panel  Lab Results  Component Value Date   CHOL 254* 09/12/2014   HDL 42* 09/12/2014   LDLCALC 178* 09/12/2014   TRIG 170* 09/12/2014   CHOLHDL 6.0* 09/12/2014        Morbid obesity Deteriorated. Patient re-educated about  the importance of commitment to a  minimum of 150 minutes of exercise per week.  The importance of healthy food choices with portion control discussed. Encouraged to start a food diary, count calories and to consider  joining a support group. Sample diet sheets offered. Goals set by the patient for the next several months.   Weight /BMI 01/15/2015 09/12/2014 07/31/2014  WEIGHT 221 lb 210 lb 0.6 oz 208 lb  HEIGHT 5\' 3"  5\' 3"  5\' 2"   BMI 39.16 kg/m2 37.22 kg/m2 38.03 kg/m2    Current exercise per week 60 minutes.       Review of Systems     Objective:   Physical Exam        Assessment & Plan:

## 2015-01-16 ENCOUNTER — Ambulatory Visit: Payer: Self-pay | Admitting: Family Medicine

## 2015-02-11 NOTE — Assessment & Plan Note (Signed)
Uncontrolled at visit. No med change, will hope that change in lifestyle and weight loss will address this, pt wishes to pursue this option first DASH diet and commitment to daily physical activity for a minimum of 30 minutes discussed and encouraged, as a part of hypertension management. The importance of attaining a healthy weight is also discussed.  BP/Weight 01/15/2015 09/12/2014 07/31/2014 06/11/2014 05/23/2014 01/18/2014 [id number]  Systolic BP 123456 123456 [id number] 123456 123456 0000000  Diastolic BP 80 80 72 76 80 84 78  Wt. (Lbs) 221 210.04 208 216 218.12 225 223.5  BMI 39.16 37.22 38.03 38.27 38.65 39.87 40.2

## 2015-02-11 NOTE — Assessment & Plan Note (Signed)
Uncontrolled Updated lab needed at/ before next visit. Hyperlipidemia:Low fat diet discussed and encouraged.   Lipid Panel  Lab Results  Component Value Date   CHOL 254* 09/12/2014   HDL 42* 09/12/2014   LDLCALC 178* 09/12/2014   TRIG 170* 09/12/2014   CHOLHDL 6.0* 09/12/2014

## 2015-02-11 NOTE — Assessment & Plan Note (Signed)
Updated lab needed at/ before next visit. Pamela Stein is reminded of the importance of commitment to daily physical activity for 30 minutes or more, as able and the need to limit carbohydrate intake to 30 to 60 grams per meal to help with blood sugar control.   The need to take medication as prescribed, test blood sugar as directed, and to call between visits if there is a concern that blood sugar is uncontrolled is also discussed.   Pamela Stein is reminded of the importance of daily foot exam, annual eye examination, and good blood sugar, blood pressure and cholesterol control. Likley deteriorated as pt has  Gained weight and is eating an excessive number of carbs admittedly  Diabetic Labs Latest Ref Rng 09/12/2014 06/08/2014 05/21/2014 05/15/2014 01/24/2014  HbA1c <5.7 % 6.9(H) - - 7.2(H) 6.8(H)  Microalbumin <2.0 mg/dL - - - - -  Micro/Creat Ratio 0.0 - 30.0 mg/g - - - - -  Chol 125 - 200 mg/dL 254(H) - - 184 216(H)  HDL >=46 mg/dL 42(L) - - 34(L) 47  Calc LDL <130 mg/dL 178(H) - - 122(H) 147(H)  Triglycerides <150 mg/dL 170(H) - - 141 108  Creatinine 0.50 - 0.99 mg/dL 1.17(H) 1.03 1.32(H) 1.42(H) 1.04   BP/Weight 01/15/2015 09/12/2014 07/31/2014 06/11/2014 05/23/2014 01/18/2014 [id number]  Systolic BP 123456 123456 [id number] 123456 123456 0000000  Diastolic BP 80 80 72 76 80 84 78  Wt. (Lbs) 221 210.04 208 216 218.12 225 223.5  BMI 39.16 37.22 38.03 38.27 38.65 39.87 40.2   Foot/eye exam completion dates Latest Ref Rng 09/12/2014 09/07/2013  Eye Exam No Retinopathy - -  Foot Form Completion - Done Done

## 2015-02-11 NOTE — Assessment & Plan Note (Signed)
Deteriorated. Patient re-educated about  the importance of commitment to a  minimum of 150 minutes of exercise per week.  The importance of healthy food choices with portion control discussed. Encouraged to start a food diary, count calories and to consider  joining a support group. Sample diet sheets offered. Goals set by the patient for the next several months.   Weight /BMI 01/15/2015 09/12/2014 07/31/2014  WEIGHT 221 lb 210 lb 0.6 oz 208 lb  HEIGHT 5\' 3"  5\' 3"  5\' 2"   BMI 39.16 kg/m2 37.22 kg/m2 38.03 kg/m2    Current exercise per week 60 minutes.

## 2015-02-11 NOTE — Assessment & Plan Note (Signed)
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

## 2015-03-02 ENCOUNTER — Other Ambulatory Visit: Payer: Self-pay | Admitting: Family Medicine

## 2015-03-14 ENCOUNTER — Ambulatory Visit: Payer: Self-pay | Admitting: Family Medicine

## 2015-03-14 ENCOUNTER — Encounter: Payer: Self-pay | Admitting: Family Medicine

## 2015-03-25 ENCOUNTER — Other Ambulatory Visit: Payer: Self-pay | Admitting: Family Medicine

## 2015-04-28 ENCOUNTER — Other Ambulatory Visit: Payer: Self-pay | Admitting: Family Medicine

## 2015-05-11 ENCOUNTER — Emergency Department (HOSPITAL_COMMUNITY)
Admission: EM | Admit: 2015-05-11 | Discharge: 2015-05-11 | Disposition: A | Discharge status: Home / Self Care | Payer: Federal, State, Local not specified - PPO | Source: Home / Self Care | Attending: Emergency Medicine | Admitting: Emergency Medicine

## 2015-05-11 ENCOUNTER — Encounter (HOSPITAL_COMMUNITY): Payer: Self-pay | Admitting: Emergency Medicine

## 2015-05-11 DIAGNOSIS — J40 Bronchitis, not specified as acute or chronic: Secondary | ICD-10-CM

## 2015-05-11 MED ORDER — PREDNISONE 10 MG PO TABS: 20.0000 mg | ORAL_TABLET | Freq: Every day | ORAL | Status: AC

## 2015-05-11 MED ORDER — ALBUTEROL SULFATE HFA 108 (90 BASE) MCG/ACT IN AERS: 2.0000 | INHALATION_SPRAY | RESPIRATORY_TRACT | Status: DC | PRN

## 2015-05-11 MED ORDER — IPRATROPIUM-ALBUTEROL 0.5-2.5 (3) MG/3ML IN SOLN
3.0000 mL | Freq: Once | RESPIRATORY_TRACT | Status: AC
  Administered 2015-05-11: 3 mL via RESPIRATORY_TRACT

## 2015-05-11 MED ORDER — IPRATROPIUM-ALBUTEROL 0.5-2.5 (3) MG/3ML IN SOLN
RESPIRATORY_TRACT | Status: AC
  Filled 2015-05-11: qty 3

## 2015-05-11 MED ORDER — AZITHROMYCIN 250 MG PO TABS: 250.0000 mg | ORAL_TABLET | Freq: Every day | ORAL | Status: DC

## 2015-05-11 NOTE — ED Notes (Signed)
Patient c/o Sob and congestion x 2-3 weeks. Patient reports frequent hx of bronchitis about once a year. Patient is in NAD.

## 2015-05-11 NOTE — ED Provider Notes (Signed)
CSN: [id number]     Arrival date & time 05/11/15  1514 History   First MD Initiated Contact with Patient 05/11/15 1645     Chief Complaint  Patient presents with  . URI   (Consider location/radiation/quality/duration/timing/severity/associated sxs/prior Treatment) HPI Pt states that for about 2 weeks she has had a cough with clear sinus drainage. Usually treated by PCP with antibx.  Tried to sing today and found that she was not able to get through the song.  No change in normal activities.  No cp, swelling of feet and ankles. No history or cardiac problems  Past Medical History  Diagnosis Date  . Diabetes mellitus   . Hyperlipidemia   . Hypertension   . Obesity   . Obstructive sleep apnea   . Back pain    Past Surgical History  Procedure Laterality Date  . Abdominal hysterectomy    . Dilation and curettage of uterus    . Endometrial ablation    . Tonsillectomy    . Cataract extraction Left   . Eye surgery Left 12/04/2013    cataract   Family History  Problem Relation Age of Onset  . Cancer Father     throat  . Diabetes Maternal Grandmother   . Hypertension Maternal Grandfather   . Hypertension Paternal Grandmother   . Diabetes Paternal Grandfather    Social History  Substance Use Topics  . Smoking status: Never Smoker   . Smokeless tobacco: Never Used  . Alcohol Use: No   OB History    Gravida Para Term Preterm AB TAB SAB Ectopic Multiple Living   1    1  1         Review of Systems Cough, wheezing Allergies  Ace inhibitors  Home Medications   Prior to Admission medications   Medication Sig Start Date End Date Taking? Authorizing Provider  ACCU-CHEK AVIVA PLUS test strip USE AS DIRECTED TO TEST TWICE DAILY   Yes Fayrene Helper, MD  ACCU-CHEK SOFTCLIX LANCETS lancets USE TO DRAW BLOOD FOR GLUCOSE TEST 12/05/10  Yes Fayrene Helper, MD  allopurinol (ZYLOPRIM) 300 MG tablet Take 1 tablet (300 mg total) by mouth daily. 05/16/14  Yes Fayrene Helper, MD   amLODipine (NORVASC) 10 MG tablet TAKE 1 TABLET BY MOUTH DAILY 03/26/15  Yes Fayrene Helper, MD  aspirin 81 MG tablet Take 81 mg by mouth daily.     Yes Historical Provider, MD  b complex vitamins tablet Take 1 tablet by mouth daily.   Yes Historical Provider, MD  Fluoxetine HCl, PMDD, 10 MG TABS One tablet once daily 01/15/15  Yes Fayrene Helper, MD  metFORMIN (GLUCOPHAGE) 500 MG tablet Take 1 tablet (500 mg total) by mouth 2 (two) times daily with a meal. 05/02/14  Yes Fayrene Helper, MD  multivitamin [facility]) per tablet Take 1 tablet by mouth daily.     Yes Historical Provider, MD  pravastatin (PRAVACHOL) 80 MG tablet TAKE 1 TABLET(80 MG) BY MOUTH EVERY EVENING 04/29/15  Yes Fayrene Helper, MD   Meds Ordered and Administered this Visit   Medications  ipratropium-albuterol (DUONEB) 0.5-2.5 (3) MG/3ML nebulizer solution 3 mL (not administered)    BP 145/85 mmHg  Pulse 85  Temp(Src) 99.7 F (37.6 C) (Oral)  SpO2 97% No data found.   Physical Exam NURSES NOTES AND VITAL SIGNS REVIEWED. CONSTITUTIONAL: Well developed, well nourished, no acute distress HEENT: normocephalic, atraumatic EYES: Conjunctiva normal NECK:normal ROM, supple, no adenopathy PULMONARY:No respiratory distress, normal  effort, some wheezing noted without consolidation or rales MUSCULOSKELETAL: Normal ROM of all extremities,  SKIN: warm and dry without rash PSYCHIATRIC: Mood and affect, behavior are normal  ED Course  Procedures (including critical care time)  Labs Review Labs Reviewed - No data to display  Imaging Review No results found.   Visual Acuity Review  Right Eye Distance:   Left Eye Distance:   Bilateral Distance:    Right Eye Near:   Left Eye Near:    Bilateral Near:      Pt is able to sing after neb treatment RX sent to pharmacy   MDM   1. Bronchitis     Patient is reassured that there are no issues that require transfer to higher level of care at this time or  additional tests. Patient is advised to continue home symptomatic treatment. Patient is advised that if there are new or worsening symptoms to attend the emergency department, contact primary care provider, or return to UC. Instructions of care provided discharged home in stable condition.    THIS NOTE WAS GENERATED USING A VOICE RECOGNITION SOFTWARE PROGRAM. ALL REASONABLE EFFORTS  WERE MADE TO PROOFREAD THIS DOCUMENT FOR ACCURACY.  I have verbally reviewed the discharge instructions with the patient. A printed AVS was given to the patient.  All questions were answered prior to discharge.      Konrad Felix, [place] 05/11/15 [phone]

## 2015-05-11 NOTE — Discharge Instructions (Signed)

## 2015-05-17 ENCOUNTER — Telehealth: Payer: Self-pay | Admitting: Family Medicine

## 2015-05-17 ENCOUNTER — Other Ambulatory Visit: Payer: Self-pay

## 2015-05-17 DIAGNOSIS — E1169 Type 2 diabetes mellitus with other specified complication: Secondary | ICD-10-CM

## 2015-05-17 NOTE — Telephone Encounter (Signed)
Pls fax to lab an order for this pt , she is going tomorrow , fasting labs last ordered are good BUT needs hep C and microalb added, thanks, she is aware, just fax over lab order

## 2015-05-17 NOTE — Telephone Encounter (Signed)
Patients lab order was sent in

## 2015-05-19 LAB — LIPID PANEL
CHOL/HDL RATIO: 4.1 ratio (ref <=5.0)
CHOLESTEROL: 183 mg/dL (ref 125–200)
HDL: 45 mg/dL — ABNORMAL LOW (ref >=46)
LDL Cholesterol: 108 mg/dL (ref <130)
TRIGLYCERIDES: 150 mg/dL — AB (ref <150)
VLDL: 30 mg/dL (ref <30)

## 2015-05-19 LAB — COMPLETE METABOLIC PANEL WITH GFR
ALT: 12 U/L (ref 6–29)
AST: 11 U/L (ref 10–35)
Albumin: 3.9 g/dL (ref 3.6–5.1)
Alkaline Phosphatase: 92 U/L (ref 33–130)
BUN: 17 mg/dL (ref 7–25)
CALCIUM: 10.2 mg/dL (ref 8.6–10.4)
CHLORIDE: 104 mmol/L (ref 98–110)
CO2: 28 mmol/L (ref 20–31)
CREATININE: 1.07 mg/dL — AB (ref 0.50–0.99)
GFR, Est African American: 65 mL/min (ref >=60)
GFR, Est Non African American: 56 mL/min — ABNORMAL LOW (ref >=60)
Glucose, Bld: 94 mg/dL (ref 65–99)
POTASSIUM: 4.9 mmol/L (ref 3.5–5.3)
Sodium: 140 mmol/L (ref 135–146)
Total Bilirubin: 0.4 mg/dL (ref 0.2–1.2)
Total Protein: 7.3 g/dL (ref 6.1–8.1)

## 2015-05-19 LAB — CBC
HEMATOCRIT: 42.9 % (ref 35.0–45.0)
Hemoglobin: 12.7 g/dL (ref 11.7–15.5)
MCH: 21.4 pg — AB (ref 27.0–33.0)
MCHC: 29.6 g/dL — ABNORMAL LOW (ref 32.0–36.0)
MCV: 72.2 fL — AB (ref 80.0–100.0)
PLATELETS: 365 10*3/uL (ref 140–400)
RBC: 5.94 MIL/uL — AB (ref 3.80–5.10)
RDW: 18.4 % — AB (ref 11.0–15.0)
WBC: 11.6 10*3/uL — AB (ref 3.8–10.8)

## 2015-05-19 LAB — TSH: TSH: 2.84 mIU/L

## 2015-05-19 LAB — URIC ACID: Uric Acid, Serum: 7.2 mg/dL — ABNORMAL HIGH (ref 2.4–7.0)

## 2015-05-19 LAB — HEMOGLOBIN A1C
HEMOGLOBIN A1C: 6.9 % — AB (ref <5.7)
MEAN PLASMA GLUCOSE: 151 mg/dL

## 2015-05-19 LAB — HEPATITIS C ANTIBODY: HCV Ab: NEGATIVE

## 2015-05-20 LAB — VITAMIN D 25 HYDROXY (VIT D DEFICIENCY, FRACTURES): VIT D 25 HYDROXY: 11 ng/mL — AB (ref 30–100)

## 2015-05-21 LAB — MICROALBUMIN / CREATININE URINE RATIO
CREATININE, URINE: 179 mg/dL (ref 20–320)
MICROALB UR: 15.4 mg/dL
MICROALB/CREAT RATIO: 86 ug/mg{creat} — AB (ref <30)

## 2015-05-22 ENCOUNTER — Encounter: Payer: Self-pay | Admitting: Family Medicine

## 2015-05-22 ENCOUNTER — Other Ambulatory Visit: Payer: Self-pay | Admitting: Family Medicine

## 2015-05-22 ENCOUNTER — Ambulatory Visit (INDEPENDENT_AMBULATORY_CARE_PROVIDER_SITE_OTHER): Payer: Federal, State, Local not specified - PPO | Admitting: Family Medicine

## 2015-05-22 VITALS — BP 130/72 | HR 80 | Resp 18 | Ht 63.0 in | Wt 225.0 lb

## 2015-05-22 DIAGNOSIS — Z23 Encounter for immunization: Secondary | ICD-10-CM | POA: Diagnosis not present

## 2015-05-22 DIAGNOSIS — E785 Hyperlipidemia, unspecified: Secondary | ICD-10-CM | POA: Diagnosis not present

## 2015-05-22 DIAGNOSIS — E559 Vitamin D deficiency, unspecified: Secondary | ICD-10-CM

## 2015-05-22 DIAGNOSIS — E1169 Type 2 diabetes mellitus with other specified complication: Secondary | ICD-10-CM

## 2015-05-22 DIAGNOSIS — M7989 Other specified soft tissue disorders: Secondary | ICD-10-CM | POA: Diagnosis not present

## 2015-05-22 DIAGNOSIS — I10 Essential (primary) hypertension: Secondary | ICD-10-CM

## 2015-05-22 DIAGNOSIS — E790 Hyperuricemia without signs of inflammatory arthritis and tophaceous disease: Secondary | ICD-10-CM

## 2015-05-22 MED ORDER — FUROSEMIDE 20 MG PO TABS: ORAL_TABLET | ORAL | Status: DC

## 2015-05-22 MED ORDER — ALLOPURINOL 300 MG PO TABS: 300.0000 mg | ORAL_TABLET | Freq: Every day | ORAL | Status: DC

## 2015-05-22 MED ORDER — POTASSIUM CHLORIDE CRYS ER 20 MEQ PO TBCR: EXTENDED_RELEASE_TABLET | ORAL | Status: DC

## 2015-05-22 MED ORDER — ERGOCALCIFEROL 1.25 MG (50000 UT) PO CAPS: 50000.0000 [IU] | ORAL_CAPSULE | ORAL | Status: DC

## 2015-05-22 NOTE — Patient Instructions (Signed)
F/u in 4.5 month, call if you need me before  Resume allopurinol daily and new are lasix and potassium as needed  Congrats on improvement  TdAp today  HBa1C, uric acid, vit D and fasting lipid. cmp and EGFR in 4.5 month  Thank you  for choosing [facility]. We consider it a privelige to serve you.  Delivering excellent health care in a caring and  compassionate way is our goal.  Partnering with you,  so that together we can achieve this goal is our strategy.

## 2015-05-22 NOTE — Progress Notes (Signed)
Subjective:    Patient ID: Pamela Stein, female    DOB: 01/24/1956, 60 y.o.   MRN: [id number]  HPI   SHADAE SENSING     MRN: [id number]      DOB: 07/01/1955   HPI Ms. Raymon is here for follow up and re-evaluation of chronic medical conditions, medication management and review of any available recent lab and radiology data.  Preventive health is updated, specifically  Cancer screening and Immunization.   Questions or concerns regarding consultations or procedures which the PT has had in the interim are  addressed. The PT denies any adverse reactions to current medications since the last visit.  There are no new concerns.  There are no specific complaints   ROS Denies recent fever or chills. Denies sinus pressure, nasal congestion, ear pain or sore throat. Denies chest congestion, productive cough or wheezing. Denies chest pains, palpitations , PND or orthopnea, c/o intermittent  leg swelling Denies abdominal pain, nausea, vomiting,diarrhea or constipation.   Denies dysuria, frequency, hesitancy or incontinence. Denies joint pain, swelling and limitation in mobility. Denies headaches, seizures, numbness, or tingling. Denies depression, anxiety or insomnia. Denies skin break down or rash.   PE  BP 130/72 mmHg  Pulse 80  Resp 18  Ht 5\' 3"  (1.6 m)  Wt 225 lb (102.059 kg)  BMI 39.87 kg/m2  SpO2 97%  Patient alert and oriented and in no cardiopulmonary distress.  HEENT: No facial asymmetry, EOMI,   oropharynx pink and moist.  Neck supple no JVD, no mass.  Chest: Clear to auscultation bilaterally.  CVS: S1, S2 no murmurs, no S3.Regular rate.  ABD: Soft non tender.   Ext: Trace  edema  MS: Adequate ROM spine, shoulders, hips and knees.  Skin: Intact, no ulcerations or rash noted.  Psych: Good eye contact, normal affect. Memory intact not anxious or depressed appearing.  CNS: CN 2-12 intact, power,  normal throughout.no focal deficits noted.   Assessment  & Plan   Essential hypertension Controlled, no change in medication DASH diet and commitment to daily physical activity for a minimum of 30 minutes discussed and encouraged, as a part of hypertension management. The importance of attaining a healthy weight is also discussed.  BP/Weight 05/22/2015 05/11/2015 01/15/2015 09/12/2014 07/31/2014 06/11/2014 [id number]  Systolic BP [id number] 123456 123456 [id number]  Diastolic BP 72 85 80 80 72 76 80  Wt. (Lbs) 225 - 221 210.04 208 216 218.12  BMI 39.87 - 39.16 37.22 38.03 38.27 38.65        Diabetes mellitus Controlled, no change in medication Ms. Avey is reminded of the importance of commitment to daily physical activity for 30 minutes or more, as able and the need to limit carbohydrate intake to 30 to 60 grams per meal to help with blood sugar control.   The need to take medication as prescribed, test blood sugar as directed, and to call between visits if there is a concern that blood sugar is uncontrolled is also discussed.   Ms. Inghram is reminded of the importance of daily foot exam, annual eye examination, and good blood sugar, blood pressure and cholesterol control.  Diabetic Labs Latest Ref Rng 05/20/2015 01/15/2015 09/12/2014 06/08/2014 05/21/2014  HbA1c <5.7 % - 6.9(H) 6.9(H) - -  Microalbumin Not estab mg/dL 15.4 - - - -  Micro/Creat Ratio <30 mcg/mg creat 86(H) - - - -  Chol 125 - 200 mg/dL - 183 254(H) - -  HDL >=46 mg/dL - 45(L) 42(L) - -  Calc LDL <130 mg/dL - 108 178(H) - -  Triglycerides <150 mg/dL - 150(H) 170(H) - -  Creatinine 0.50 - 0.99 mg/dL - 1.07(H) 1.17(H) 1.03 1.32(H)   BP/Weight 05/22/2015 05/11/2015 01/15/2015 09/12/2014 07/31/2014 06/11/2014 [id number]  Systolic BP [id number] [id number] 123456 123456 [id number]  Diastolic BP 72 85 80 80 72 76 80  Wt. (Lbs) 225 - 221 210.04 208 216 218.12  BMI 39.87 - 39.16 37.22 38.03 38.27 38.65   Foot/eye exam completion dates Latest Ref Rng 09/12/2014 09/07/2013  Eye Exam No Retinopathy - -  Foot Form Completion -  Done Done         Morbid obesity Deteriorated. Patient re-educated about  the importance of commitment to a  minimum of 150 minutes of exercise per week.  The importance of healthy food choices with portion control discussed. Encouraged to start a food diary, count calories and to consider  joining a support group. Sample diet sheets offered. Goals set by the patient for the next several months.   Weight /BMI 05/22/2015 01/15/2015 09/12/2014  WEIGHT 225 lb 221 lb 210 lb 0.6 oz  HEIGHT 5\' 3"  5\' 3"  5\' 3"   BMI 39.87 kg/m2 39.16 kg/m2 37.22 kg/m2    Current exercise per week 60 minutes.   Hyperlipidemia LDL goal <100 Hyperlipidemia:Low fat diet discussed and encouraged.   Lipid Panel  Lab Results  Component Value Date   CHOL 183 01/15/2015   HDL 45* 01/15/2015   LDLCALC 108 01/15/2015   TRIG 150* 01/15/2015   CHOLHDL 4.1 01/15/2015    Improved and near goal, needs to increase exercise and lower fat intake   Leg swelling Intermittent mild edema, lasix and potassium use max of 3 times weekly Leg elevation and sodium reduction discussed  Hyperuricemia Updated lab needed at/ before next visit.       Review of Systems     Objective:   Physical Exam        Assessment & Plan:

## 2015-05-24 DIAGNOSIS — Z23 Encounter for immunization: Secondary | ICD-10-CM | POA: Insufficient documentation

## 2015-05-24 NOTE — Assessment & Plan Note (Signed)
Hyperlipidemia:Low fat diet discussed and encouraged.   Lipid Panel  Lab Results  Component Value Date   CHOL 183 01/15/2015   HDL 45* 01/15/2015   LDLCALC 108 01/15/2015   TRIG 150* 01/15/2015   CHOLHDL 4.1 01/15/2015    Improved and near goal, needs to increase exercise and lower fat intake

## 2015-05-24 NOTE — Assessment & Plan Note (Signed)
Updated lab needed at/ before next visit.   

## 2015-05-24 NOTE — Assessment & Plan Note (Signed)
Controlled, no change in medication DASH diet and commitment to daily physical activity for a minimum of 30 minutes discussed and encouraged, as a part of hypertension management. The importance of attaining a healthy weight is also discussed.  BP/Weight 05/22/2015 05/11/2015 01/15/2015 09/12/2014 07/31/2014 06/11/2014 [id number]  Systolic BP [id number] Q000111Q 123456 123456 [id number] 123456  Diastolic BP 72 85 80 80 72 76 80  Wt. (Lbs) 225 - 221 210.04 208 216 218.12  BMI 39.87 - 39.16 37.22 38.03 38.27 38.65

## 2015-05-24 NOTE — Assessment & Plan Note (Signed)
After obtaining informed consent, the vaccine is  administered by LPN.  

## 2015-05-24 NOTE — Assessment & Plan Note (Signed)
Controlled, no change in medication Pamela Stein is reminded of the importance of commitment to daily physical activity for 30 minutes or more, as able and the need to limit carbohydrate intake to 30 to 60 grams per meal to help with blood sugar control.   The need to take medication as prescribed, test blood sugar as directed, and to call between visits if there is a concern that blood sugar is uncontrolled is also discussed.   Pamela Stein is reminded of the importance of daily foot exam, annual eye examination, and good blood sugar, blood pressure and cholesterol control.  Diabetic Labs Latest Ref Rng 05/20/2015 01/15/2015 09/12/2014 06/08/2014 05/21/2014  HbA1c <5.7 % - 6.9(H) 6.9(H) - -  Microalbumin Not estab mg/dL 15.4 - - - -  Micro/Creat Ratio <30 mcg/mg creat 86(H) - - - -  Chol 125 - 200 mg/dL - 183 254(H) - -  HDL >=46 mg/dL - 45(L) 42(L) - -  Calc LDL <130 mg/dL - 108 178(H) - -  Triglycerides <150 mg/dL - 150(H) 170(H) - -  Creatinine 0.50 - 0.99 mg/dL - 1.07(H) 1.17(H) 1.03 1.32(H)   BP/Weight 05/22/2015 05/11/2015 01/15/2015 09/12/2014 07/31/2014 06/11/2014 [id number]  Systolic BP [id number] Q000111Q 123456 123456 [id number] 123456  Diastolic BP 72 85 80 80 72 76 80  Wt. (Lbs) 225 - 221 210.04 208 216 218.12  BMI 39.87 - 39.16 37.22 38.03 38.27 38.65   Foot/eye exam completion dates Latest Ref Rng 09/12/2014 09/07/2013  Eye Exam No Retinopathy - -  Foot Form Completion - Done Done

## 2015-05-24 NOTE — Assessment & Plan Note (Signed)
Deteriorated. Patient re-educated about  the importance of commitment to a  minimum of 150 minutes of exercise per week.  The importance of healthy food choices with portion control discussed. Encouraged to start a food diary, count calories and to consider  joining a support group. Sample diet sheets offered. Goals set by the patient for the next several months.   Weight /BMI 05/22/2015 01/15/2015 09/12/2014  WEIGHT 225 lb 221 lb 210 lb 0.6 oz  HEIGHT 5\' 3"  5\' 3"  5\' 3"   BMI 39.87 kg/m2 39.16 kg/m2 37.22 kg/m2    Current exercise per week 60 minutes.

## 2015-05-24 NOTE — Assessment & Plan Note (Signed)
Intermittent mild edema, lasix and potassium use max of 3 times weekly Leg elevation and sodium reduction discussed

## 2015-06-12 ENCOUNTER — Other Ambulatory Visit: Payer: Self-pay

## 2015-06-12 ENCOUNTER — Telehealth: Payer: Self-pay

## 2015-06-12 MED ORDER — ALBUTEROL SULFATE HFA 108 (90 BASE) MCG/ACT IN AERS: 2.0000 | INHALATION_SPRAY | RESPIRATORY_TRACT | Status: DC | PRN

## 2015-06-12 NOTE — Telephone Encounter (Signed)
pls refill x 1 and let her knwo use IF NEEDED

## 2015-06-12 NOTE — Telephone Encounter (Signed)
Called and left voicemail notifying patient.  Refill sent to pharmacy.

## 2015-06-19 ENCOUNTER — Telehealth: Payer: Self-pay | Admitting: Family Medicine

## 2015-06-19 DIAGNOSIS — E1169 Type 2 diabetes mellitus with other specified complication: Secondary | ICD-10-CM

## 2015-06-19 DIAGNOSIS — I10 Essential (primary) hypertension: Secondary | ICD-10-CM

## 2015-06-19 DIAGNOSIS — E785 Hyperlipidemia, unspecified: Secondary | ICD-10-CM

## 2015-06-19 NOTE — Telephone Encounter (Signed)
Patient aware and would like rx for compression hose.  Will have labs as ordered.

## 2015-06-19 NOTE — Telephone Encounter (Addendum)
Pt spoke with me on 5/7 expressed concern about leg swelling.( minimal when I saw it on that day outside of the office) Pls let her knwomI recviewed her record. She has ahd complete heart eval with stress test 10/2013 which was normal I had told her iI would refer her to cardiology but do not think that is needed now. Explain that amlodipine has slight s/e of leg swelling. I recommned the compression hose and offfer a script if she has none  Her kidney function was checked out normal in Dec, she doe need fasting lipid, cmp and EGFR and HBa1C in next 1 to 2 weeks pls advise and order

## 2015-06-19 NOTE — Telephone Encounter (Signed)
Pamela Stein is calling asking for a returned call from [place], please advise?

## 2015-06-19 NOTE — Telephone Encounter (Signed)
Called and left message for patient to return call.  

## 2015-06-19 NOTE — Telephone Encounter (Signed)
Written pls send

## 2015-06-19 NOTE — Addendum Note (Signed)
Addended by: Denman George B on: 06/19/2015 12:03 PM   Modules accepted: Orders

## 2015-06-19 NOTE — Telephone Encounter (Signed)
Message completed in prior msg

## 2015-06-19 NOTE — Telephone Encounter (Signed)
Spoke with patient and she is aware of advice.

## 2015-06-20 NOTE — Telephone Encounter (Signed)
RX faxed

## 2015-07-18 ENCOUNTER — Other Ambulatory Visit: Payer: Self-pay

## 2015-07-18 MED ORDER — ALBUTEROL SULFATE HFA 108 (90 BASE) MCG/ACT IN AERS: 2.0000 | INHALATION_SPRAY | RESPIRATORY_TRACT | Status: DC | PRN

## 2015-08-17 IMAGING — US US SOFT TISSUE HEAD/NECK
1 series · 13 of 25 positions shown · non-contrast
Comparison: [DATE]

CLINICAL DATA: Hyperparathyroidism, hypercalcemia

EXAM:
THYROID ULTRASOUND
TECHNIQUE: Ultrasound examination of the thyroid gland and adjacent soft
tissues was performed.

[Series 1: us soft tissue head/neck · 0.06mm/px · 13 of 50 slices shown]
[im 1/50]
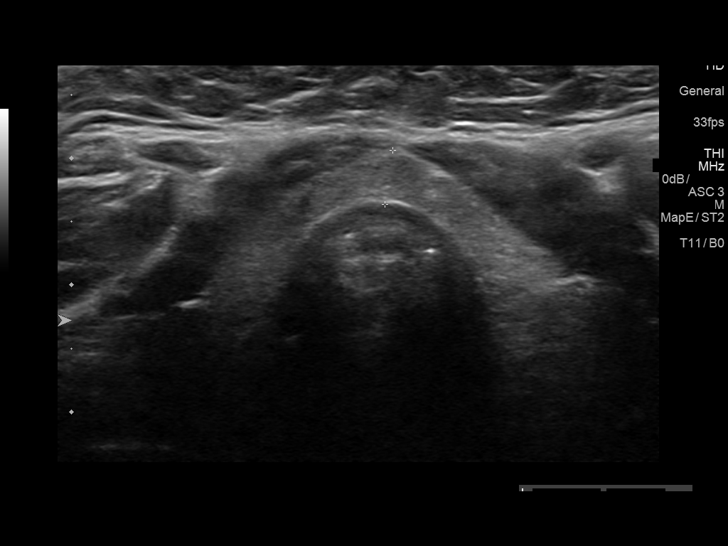
[im 5/50]
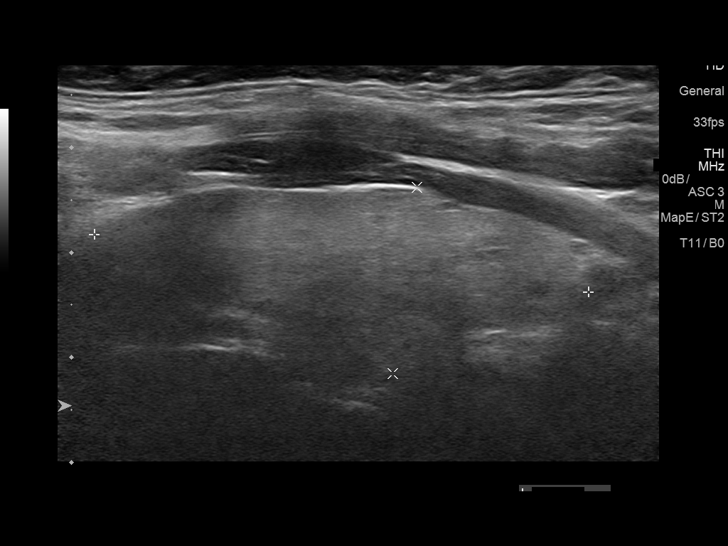
[im 9/50]
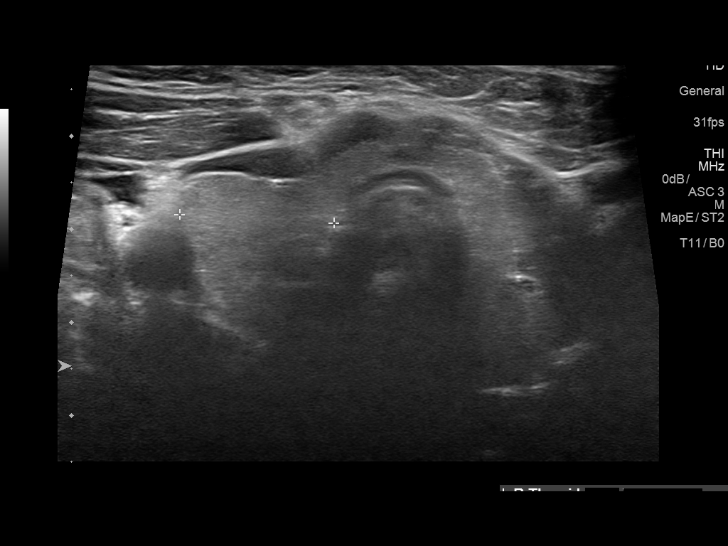
[im 13/50]
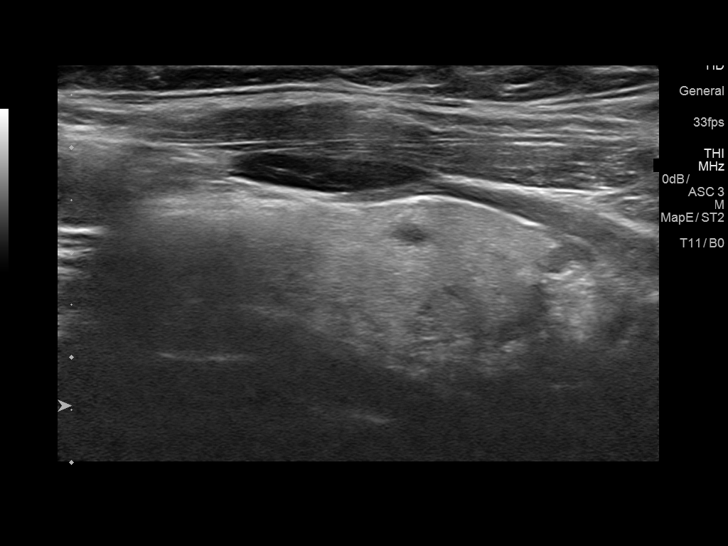
[im 17/50]
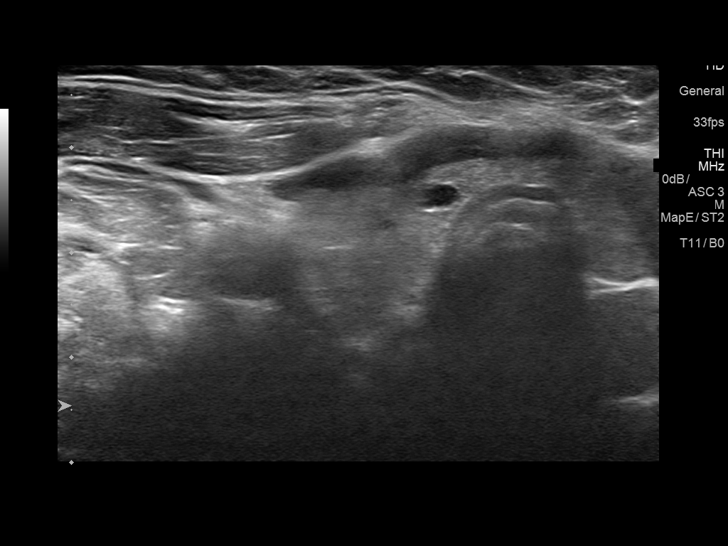
[im 21/50]
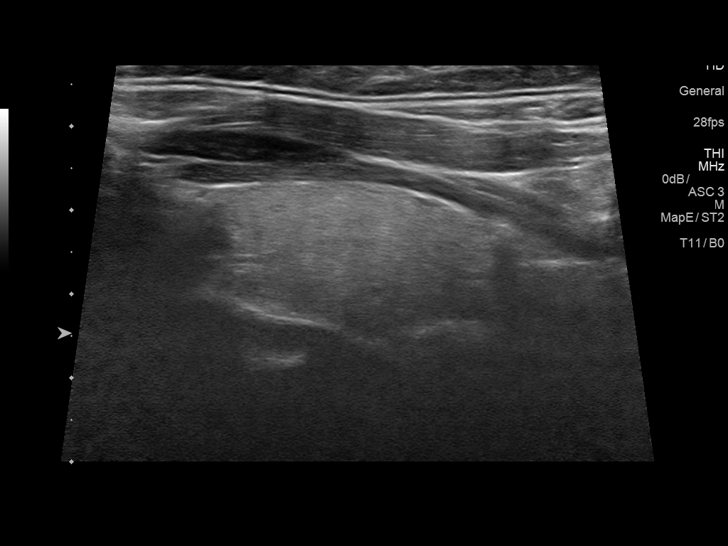
[im 25/50]
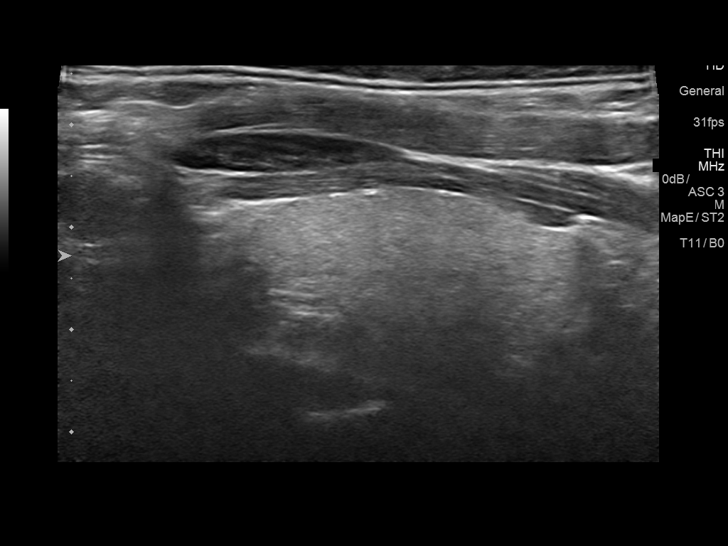
[im 29/50]
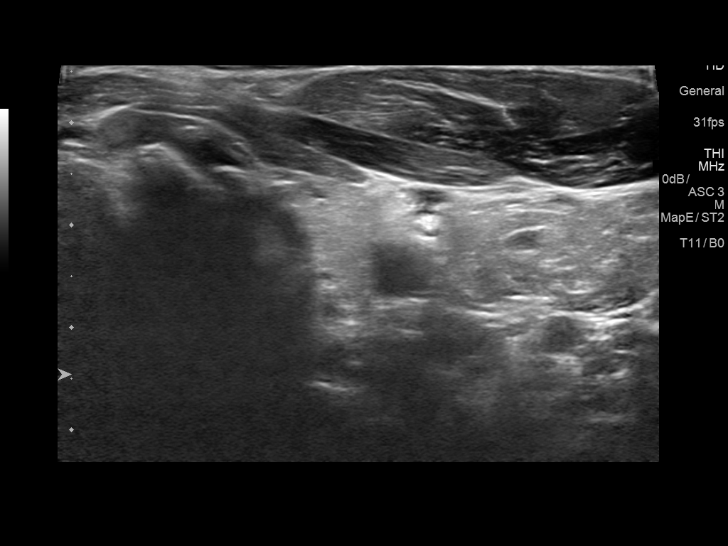
[im 33/50]
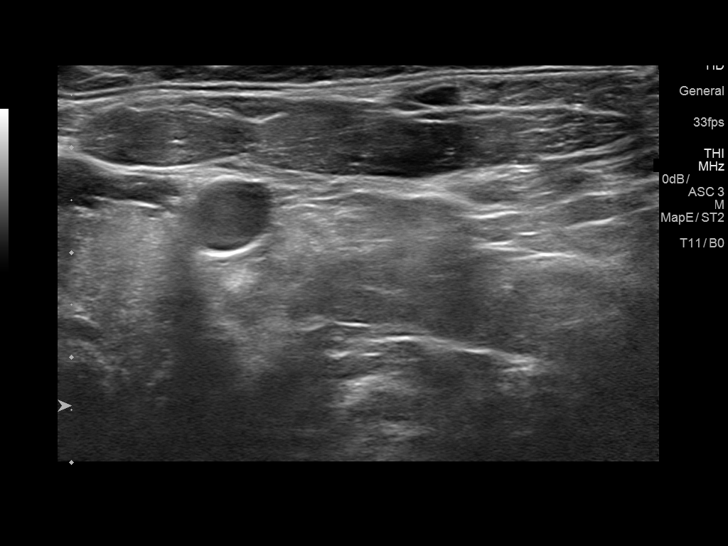
[im 37/50]
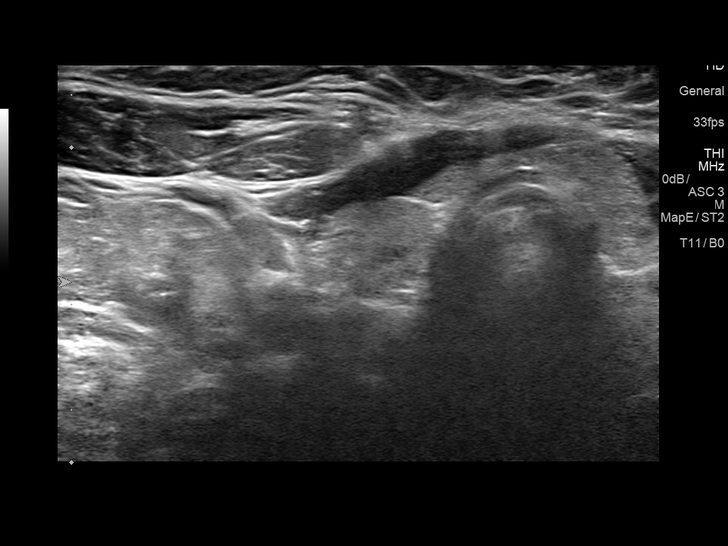
[im 41/50]
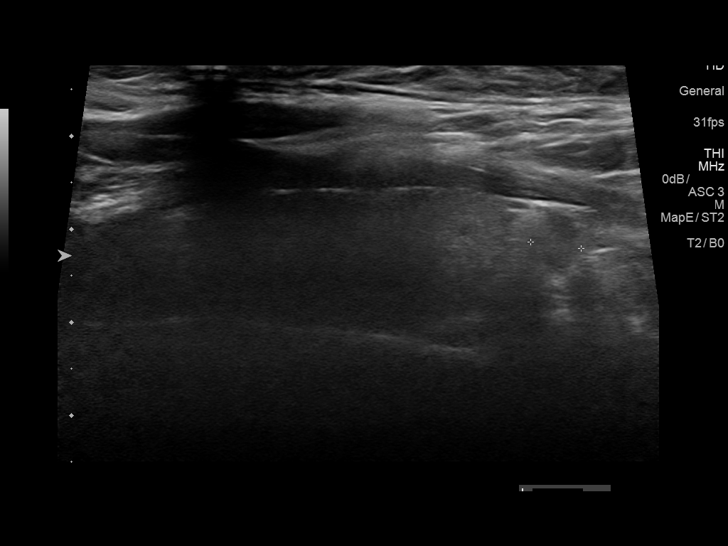
[im 45/50]
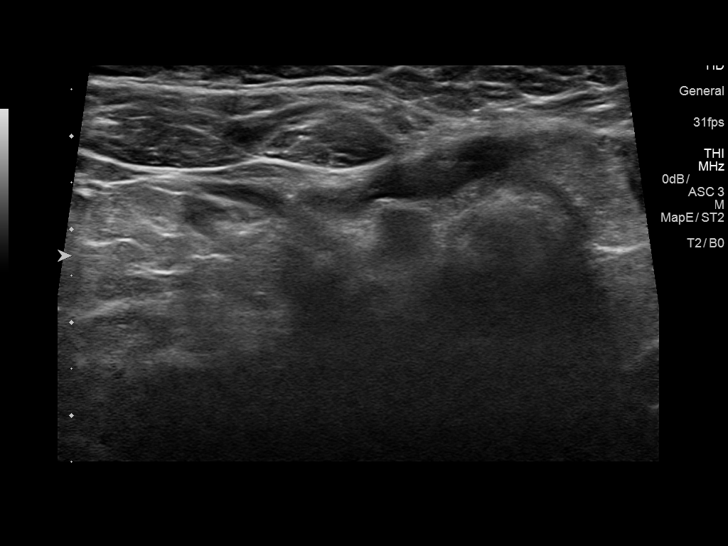
[im 50/50]
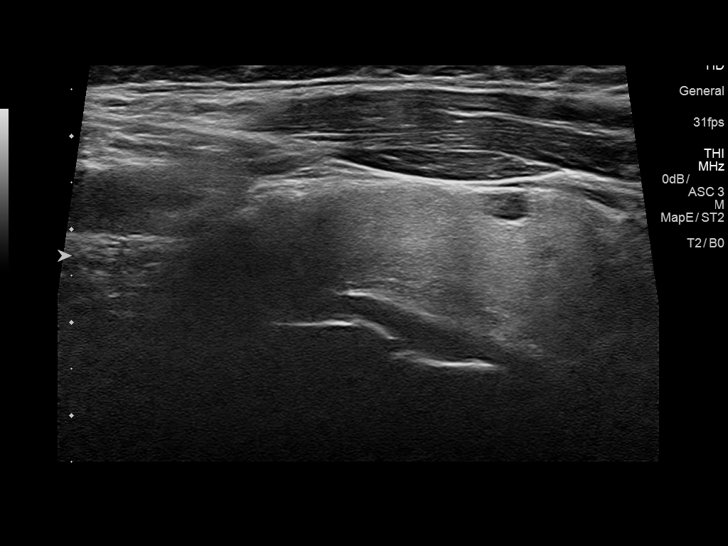

[13 of 25 positions shown; findings below may reference images not displayed]

FINDINGS: Parenchymal Echotexture: Mildly heterogenous

Isthmus: 4 mm

Right lobe: 4.7 x 1.81.7 cm

Left lobe: 4.0 x 1.9 x 1.9 cm

_________________________________________________________

Estimated total number of nodules >/= 1 cm: 0

Number of spongiform nodules >/=  2 cm not described below (TR1): 0

Number of mixed cystic and solid nodules >/= 1.5 cm not described
below (TR2): 0

_________________________________________________________

Small incidental subcentimeter right thyroid cysts measure 4 mm or
less.

Nodule # 1:

Location: Right; Inferior

Maximum size: 0.6 cm; Other 2 dimensions: 0.5 x 0.6 cm

Composition: solid/almost completely solid (2)

Echogenicity: hypoechoic (2)

Shape: not taller-than-wide (0)

Margins: ill-defined (0)

Echogenic foci: none (0)

ACR TI-RADS total points: 4.

ACR TI-RADS risk category: TR4 (4-6 points).

ACR TI-RADS recommendations:

Given size (<0.9 cm) and appearance, this nodule does NOT meet
TI-RADS criteria for biopsy or dedicated follow-up.

_________________________________________________________
IMPRESSION: 0.6 cm right inferior hypoechoic solid TR 4 nodule. This nodule does
not meet criteria for biopsy or additional follow-up.

Scattered incidental subcentimeter thyroid cysts.

No adenopathy or other significant soft tissue abnormality

The above is in keeping with the ACR TI-RADS recommendations - [HOSPITAL] [2Z];[DATE].

## 2015-10-10 ENCOUNTER — Ambulatory Visit: Payer: Self-pay | Admitting: Family Medicine

## 2015-10-29 ENCOUNTER — Ambulatory Visit (INDEPENDENT_AMBULATORY_CARE_PROVIDER_SITE_OTHER): Payer: Federal, State, Local not specified - PPO | Admitting: Family Medicine

## 2015-10-29 ENCOUNTER — Encounter: Payer: Self-pay | Admitting: Family Medicine

## 2015-10-29 VITALS — BP 154/84 | HR 83 | Resp 16 | Ht 63.0 in | Wt 225.0 lb

## 2015-10-29 DIAGNOSIS — E790 Hyperuricemia without signs of inflammatory arthritis and tophaceous disease: Secondary | ICD-10-CM

## 2015-10-29 DIAGNOSIS — E1169 Type 2 diabetes mellitus with other specified complication: Secondary | ICD-10-CM

## 2015-10-29 DIAGNOSIS — I10 Essential (primary) hypertension: Secondary | ICD-10-CM | POA: Diagnosis not present

## 2015-10-29 DIAGNOSIS — Z23 Encounter for immunization: Secondary | ICD-10-CM | POA: Diagnosis not present

## 2015-10-29 DIAGNOSIS — E785 Hyperlipidemia, unspecified: Secondary | ICD-10-CM

## 2015-10-29 DIAGNOSIS — E559 Vitamin D deficiency, unspecified: Secondary | ICD-10-CM

## 2015-10-29 LAB — LIPID PANEL
CHOLESTEROL: 229 mg/dL — AB (ref 125–200)
HDL: 42 mg/dL — ABNORMAL LOW (ref >=46)
LDL Cholesterol: 158 mg/dL — ABNORMAL HIGH (ref <130)
Total CHOL/HDL Ratio: 5.5 Ratio — ABNORMAL HIGH (ref <=5.0)
Triglycerides: 146 mg/dL (ref <150)
VLDL: 29 mg/dL (ref <30)

## 2015-10-29 LAB — COMPLETE METABOLIC PANEL WITH GFR
ALBUMIN: 4 g/dL (ref 3.6–5.1)
ALT: 13 U/L (ref 6–29)
AST: 14 U/L (ref 10–35)
Alkaline Phosphatase: 89 U/L (ref 33–130)
BILIRUBIN TOTAL: 0.3 mg/dL (ref 0.2–1.2)
BUN: 14 mg/dL (ref 7–25)
CALCIUM: 10.2 mg/dL (ref 8.6–10.4)
CHLORIDE: 105 mmol/L (ref 98–110)
CO2: 26 mmol/L (ref 20–31)
CREATININE: 1.03 mg/dL — AB (ref 0.50–0.99)
GFR, Est African American: 67 mL/min (ref >=60)
GFR, Est Non African American: 58 mL/min — ABNORMAL LOW (ref >=60)
Glucose, Bld: 90 mg/dL (ref 65–99)
Potassium: 4.9 mmol/L (ref 3.5–5.3)
Sodium: 139 mmol/L (ref 135–146)
Total Protein: 7.4 g/dL (ref 6.1–8.1)

## 2015-10-29 LAB — URIC ACID: Uric Acid, Serum: 4.2 mg/dL (ref 2.5–7.0)

## 2015-10-29 MED ORDER — GABAPENTIN 300 MG PO CAPS
300.0000 mg | ORAL_CAPSULE | Freq: Three times a day (TID) | ORAL | 3 refills | Status: AC
Start: 2015-10-29 — End: ?

## 2015-10-29 MED ORDER — SPIRONOLACTONE 25 MG PO TABS: 25.0000 mg | ORAL_TABLET | Freq: Every day | ORAL | 3 refills | Status: DC

## 2015-10-29 NOTE — Assessment & Plan Note (Signed)
Uncontrolled , add spoironolactone DASH diet and commitment to daily physical activity for a minimum of 30 minutes discussed and encouraged, as a part of hypertension management. The importance of attaining a healthy weight is also discussed.  BP/Weight 10/29/2015 05/22/2015 05/11/2015 01/15/2015 09/12/2014 [id number]  Systolic BP 123456 [id number] Q000111Q 123456 123456 [id number]  Diastolic BP 84 72 85 80 80 72 76  Wt. (Lbs) 225 225 - 221 210.04 208 216  BMI 39.86 39.87 - 39.16 37.22 38.03 38.27

## 2015-10-29 NOTE — Patient Instructions (Addendum)
F/u in 6 weeks, call if you need me sooner  Labs today  New additional medication for blood pressure today, spironolactone 25 mg one daily  Vit D3 1000 IU once daily is good oTC  Supplement  Flu vaccine and Pneumonia 23 vaccines today\  Please work on good  health habits so that your health will improve. 1. Commitment to daily physical activity for 30 to 60  minutes, if you are able to do this.  2. Commitment to wise food choices. Aim for half of your  food intake to be vegetable and fruit, one quarter starchy foods, and one quarter protein. Try to eat on a regular schedule  3 meals per day, snacking between meals should be limited to vegetables or fruits or small portions of nuts. 64 ounces of water per day is generally recommended, unless you have specific health conditions, like heart failure or kidney failure where you will need to limit fluid intake.  3. Commitment to sufficient and a  good quality of physical and mental rest daily, generally between 6 to 8 hours per day.  WITH PERSISTANCE AND PERSEVERANCE, THE IMPOSSIBLE , BECOMES THE NORM! Thank you  for choosing [facility]. We consider it a privelige to serve you.  Delivering excellent health care in a caring and  compassionate way is our goal.  Partnering with you,  so that together we can achieve this goal is our strategy.

## 2015-10-30 ENCOUNTER — Other Ambulatory Visit: Payer: Self-pay | Admitting: Family Medicine

## 2015-10-30 ENCOUNTER — Ambulatory Visit: Payer: Self-pay | Admitting: Family Medicine

## 2015-10-30 DIAGNOSIS — E119 Type 2 diabetes mellitus without complications: Secondary | ICD-10-CM

## 2015-10-30 LAB — HEMOGLOBIN A1C
Hgb A1c MFr Bld: 6.4 % — ABNORMAL HIGH (ref <5.7)
Mean Plasma Glucose: 137 mg/dL

## 2015-10-30 LAB — VITAMIN D 25 HYDROXY (VIT D DEFICIENCY, FRACTURES): VIT D 25 HYDROXY: 20 ng/mL — AB (ref 30–100)

## 2015-10-31 DIAGNOSIS — E559 Vitamin D deficiency, unspecified: Secondary | ICD-10-CM | POA: Insufficient documentation

## 2015-10-31 NOTE — Progress Notes (Signed)
Pamela Stein     MRN: [id number]      DOB: 08/05/1955   HPI Pamela Stein is here for follow up and re-evaluation of chronic medical conditions, medication management and review of any available recent lab and radiology data.  Preventive health is updated, specifically  Cancer screening and Immunization.   Questions or concerns regarding consultations or procedures which the PT has had in the interim are  addressed. The PT denies any adverse reactions to current medications since the last visit.  There are no new concerns.  There are no specific complaints  Denies polyuria, polydipsia, blurred vision , or hypoglycemic episodes.   ROS Denies recent fever or chills. Denies sinus pressure, nasal congestion, ear pain or sore throat. Denies chest congestion, productive cough or wheezing. Denies chest pains, palpitations and leg swelling Denies abdominal pain, nausea, vomiting,diarrhea or constipation.   Denies dysuria, frequency, hesitancy or incontinence. Denies joint pain, swelling and limitation in mobility. Denies headaches, seizures, numbness, or tingling. Denies depression, anxiety or insomnia. Denies skin break down or rash.   PE  BP (!) 154/84   Pulse 83   Resp 16   Ht 5\' 3"  (1.6 m)   Wt 225 lb (102.1 kg)   SpO2 98%   BMI 39.86 kg/m   Patient alert and oriented and in no cardiopulmonary distress.  HEENT: No facial asymmetry, EOMI,   oropharynx pink and moist.  Neck supple no JVD, no mass.  Chest: Clear to auscultation bilaterally.  CVS: S1, S2 no murmurs, no S3.Regular rate.  ABD: Soft non tender.   Ext: No edema  MS: Adequate ROM spine, shoulders, hips and knees.  Skin: Intact, no ulcerations or rash noted.  Psych: Good eye contact, normal affect. Memory intact not anxious or depressed appearing.  CNS: CN 2-12 intact, power,  normal throughout.no focal deficits noted.   Assessment & Plan  Essential hypertension Uncontrolled , add  spoironolactone DASH diet and commitment to daily physical activity for a minimum of 30 minutes discussed and encouraged, as a part of hypertension management. The importance of attaining a healthy weight is also discussed.  BP/Weight 10/29/2015 05/22/2015 05/11/2015 01/15/2015 09/12/2014 [id number]  Systolic BP 123456 [id number] Q000111Q 123456 123456 [id number]  Diastolic BP 84 72 85 80 80 72 76  Wt. (Lbs) 225 225 - 221 210.04 208 216  BMI 39.86 39.87 - 39.16 37.22 38.03 38.27       Diabetes mellitus Pamela Stein is reminded of the importance of commitment to daily physical activity for 30 minutes or more, as able and the need to limit carbohydrate intake to 30 to 60 grams per meal to help with blood sugar control.   The need to take medication as prescribed, test blood sugar as directed, and to call between visits if there is a concern that blood sugar is uncontrolled is also discussed.   Pamela Stein is reminded of the importance of daily foot exam, annual eye examination, and good blood sugar, blood pressure and cholesterol control. Controlled, no change in medication   Diabetic Labs Latest Ref Rng & Units 10/29/2015 05/20/2015 01/15/2015 09/12/2014 06/08/2014  HbA1c <5.7 % 6.4(H) - 6.9(H) 6.9(H) -  Microalbumin Not estab mg/dL - 15.4 - - -  Micro/Creat Ratio <30 mcg/mg creat - 86(H) - - -  Chol 125 - 200 mg/dL 229(H) - 183 254(H) -  HDL >=46 mg/dL 42(L) - 45(L) 42(L) -  Calc LDL <130 mg/dL 158(H) - 108 178(H) -  Triglycerides <150  mg/dL 146 - 150(H) 170(H) -  Creatinine 0.50 - 0.99 mg/dL 1.03(H) - 1.07(H) 1.17(H) 1.03   BP/Weight 10/29/2015 05/22/2015 05/11/2015 01/15/2015 09/12/2014 [id number]  Systolic BP 123456 [id number] Q000111Q 123456 123456 [id number]  Diastolic BP 84 72 85 80 80 72 76  Wt. (Lbs) 225 225 - 221 210.04 208 216  BMI 39.86 39.87 - 39.16 37.22 38.03 38.27   Foot/eye exam completion dates Latest Ref Rng & Units 09/12/2014 09/07/2013  Eye Exam No Retinopathy - -  Foot Form Completion - Done Done         Morbid obesity Unchanged. Patient re-educated about  the importance of commitment to a  minimum of 150 minutes of exercise per week.  The importance of healthy food choices with portion control discussed. Encouraged to start a food diary, count calories and to consider  joining a support group. Sample diet sheets offered. Goals set by the patient for the next several months.   Weight /BMI 10/29/2015 05/22/2015 01/15/2015  WEIGHT 225 lb 225 lb 221 lb  HEIGHT 5\' 3"  5\' 3"  5\' 3"   BMI 39.86 kg/m2 39.87 kg/m2 39.16 kg/m2      Hyperlipidemia LDL goal <100 Hyperlipidemia:Low fat diet discussed and encouraged.   Lipid Panel  Lab Results  Component Value Date   CHOL 229 (H) 10/29/2015   HDL 42 (L) 10/29/2015   LDLCALC 158 (H) 10/29/2015   TRIG 146 10/29/2015   CHOLHDL 5.5 (H) 10/29/2015   Uncontrolled, needs statin therapy and to change diet    Need for prophylactic vaccination and inoculation against influenza After obtaining informed consent, the vaccine is  administered by LPN.   Need for 23-polyvalent pneumococcal polysaccharide vaccine After obtaining informed consent, the vaccine is  administered by LPN.   Hyperuricemia Controlled, no change in medication   Vitamin D deficiency Uncorrected , recommend supplementing , will be called after visit, lab unable at time of  visit

## 2015-10-31 NOTE — Assessment & Plan Note (Signed)
Hyperlipidemia:Low fat diet discussed and encouraged.   Lipid Panel  Lab Results  Component Value Date   CHOL 229 (H) 10/29/2015   HDL 42 (L) 10/29/2015   LDLCALC 158 (H) 10/29/2015   TRIG 146 10/29/2015   CHOLHDL 5.5 (H) 10/29/2015   Uncontrolled, needs statin therapy and to change diet

## 2015-10-31 NOTE — Assessment & Plan Note (Signed)
After obtaining informed consent, the vaccine is  administered by LPN.  

## 2015-10-31 NOTE — Assessment & Plan Note (Signed)
Uncorrected , recommend supplementing , will be called after visit, lab unable at time of  visit

## 2015-10-31 NOTE — Assessment & Plan Note (Signed)
Controlled, no change in medication  

## 2015-10-31 NOTE — Assessment & Plan Note (Signed)
Unchanged. Patient re-educated about  the importance of commitment to a  minimum of 150 minutes of exercise per week.  The importance of healthy food choices with portion control discussed. Encouraged to start a food diary, count calories and to consider  joining a support group. Sample diet sheets offered. Goals set by the patient for the next several months.   Weight /BMI 10/29/2015 05/22/2015 01/15/2015  WEIGHT 225 lb 225 lb 221 lb  HEIGHT 5\' 3"  5\' 3"  5\' 3"   BMI 39.86 kg/m2 39.87 kg/m2 39.16 kg/m2

## 2015-10-31 NOTE — Assessment & Plan Note (Signed)
Pamela Stein is reminded of the importance of commitment to daily physical activity for 30 minutes or more, as able and the need to limit carbohydrate intake to 30 to 60 grams per meal to help with blood sugar control.   The need to take medication as prescribed, test blood sugar as directed, and to call between visits if there is a concern that blood sugar is uncontrolled is also discussed.   Pamela Stein is reminded of the importance of daily foot exam, annual eye examination, and good blood sugar, blood pressure and cholesterol control. Controlled, no change in medication   Diabetic Labs Latest Ref Rng & Units 10/29/2015 05/20/2015 01/15/2015 09/12/2014 06/08/2014  HbA1c <5.7 % 6.4(H) - 6.9(H) 6.9(H) -  Microalbumin Not estab mg/dL - 15.4 - - -  Micro/Creat Ratio <30 mcg/mg creat - 86(H) - - -  Chol 125 - 200 mg/dL 229(H) - 183 254(H) -  HDL >=46 mg/dL 42(L) - 45(L) 42(L) -  Calc LDL <130 mg/dL 158(H) - 108 178(H) -  Triglycerides <150 mg/dL 146 - 150(H) 170(H) -  Creatinine 0.50 - 0.99 mg/dL 1.03(H) - 1.07(H) 1.17(H) 1.03   BP/Weight 10/29/2015 05/22/2015 05/11/2015 01/15/2015 09/12/2014 [id number]  Systolic BP 123456 [id number] Q000111Q 123456 123456 [id number]  Diastolic BP 84 72 85 80 80 72 76  Wt. (Lbs) 225 225 - 221 210.04 208 216  BMI 39.86 39.87 - 39.16 37.22 38.03 38.27   Foot/eye exam completion dates Latest Ref Rng & Units 09/12/2014 09/07/2013  Eye Exam No Retinopathy - -  Foot Form Completion - Done Done

## 2015-11-01 ENCOUNTER — Telehealth: Payer: Self-pay

## 2015-11-01 DIAGNOSIS — E559 Vitamin D deficiency, unspecified: Secondary | ICD-10-CM

## 2015-11-01 DIAGNOSIS — I10 Essential (primary) hypertension: Secondary | ICD-10-CM

## 2015-11-01 DIAGNOSIS — E785 Hyperlipidemia, unspecified: Secondary | ICD-10-CM

## 2015-11-01 DIAGNOSIS — E1169 Type 2 diabetes mellitus with other specified complication: Secondary | ICD-10-CM

## 2015-11-01 NOTE — Telephone Encounter (Signed)
-----  Message from Fayrene Helper, MD sent at 10/30/2015  4:32 PM EDT ----- pls let her know urica acid, blood sugar, kidney and liver are excellent Cholesterol is high and vit D low. Commit to OTC once daily vit D3 1000 iU If really taking Pravachol 80 mg, change to lipitor 20 mg and lower fat in diet. If was not taking the Pravachol 80 mg every night , she needs to  Needs fasting lipid, cmp and eGFR , vit D and HBA1c in 4 month?? pls ask

## 2015-11-27 DIAGNOSIS — H5340 Unspecified visual field defects: Secondary | ICD-10-CM | POA: Diagnosis not present

## 2015-12-01 ENCOUNTER — Other Ambulatory Visit: Payer: Self-pay | Admitting: Family Medicine

## 2015-12-10 ENCOUNTER — Ambulatory Visit: Payer: Self-pay | Admitting: Family Medicine

## 2015-12-10 DIAGNOSIS — K080 Exfoliation of teeth due to systemic causes: Secondary | ICD-10-CM | POA: Diagnosis not present

## 2015-12-18 ENCOUNTER — Ambulatory Visit (INDEPENDENT_AMBULATORY_CARE_PROVIDER_SITE_OTHER): Payer: Federal, State, Local not specified - PPO | Admitting: Family Medicine

## 2015-12-18 ENCOUNTER — Encounter: Payer: Self-pay | Admitting: Family Medicine

## 2015-12-18 VITALS — BP 150/82 | HR 87 | Resp 16 | Ht 62.5 in | Wt 223.0 lb

## 2015-12-18 DIAGNOSIS — E790 Hyperuricemia without signs of inflammatory arthritis and tophaceous disease: Secondary | ICD-10-CM | POA: Diagnosis not present

## 2015-12-18 DIAGNOSIS — E785 Hyperlipidemia, unspecified: Secondary | ICD-10-CM | POA: Diagnosis not present

## 2015-12-18 DIAGNOSIS — I10 Essential (primary) hypertension: Secondary | ICD-10-CM | POA: Diagnosis not present

## 2015-12-18 DIAGNOSIS — G4733 Obstructive sleep apnea (adult) (pediatric): Secondary | ICD-10-CM

## 2015-12-18 DIAGNOSIS — E1169 Type 2 diabetes mellitus with other specified complication: Secondary | ICD-10-CM | POA: Diagnosis not present

## 2015-12-18 DIAGNOSIS — E559 Vitamin D deficiency, unspecified: Secondary | ICD-10-CM

## 2015-12-18 MED ORDER — LOVASTATIN 20 MG PO TABS
20.0000 mg | ORAL_TABLET | Freq: Every day | ORAL | 0 refills | Status: DC
Start: 2015-12-18 — End: 2016-02-23

## 2015-12-18 NOTE — Patient Instructions (Addendum)
F/u in 2 months, call if you need me before   Please work on weight loss and take time for yourself away from work, need this  Need mammogram Lovastatin for 1 month only, $4 at [facility] without insurance then resume pravstatin, as before  PLEASE DO NOT lose medications  Need gyne exam  Fasting lipid. cmp and EGFR, HBa1C, uric acid, tSH and cBC in 2 months  Please work on good  health habits so that your health will improve. 1. Commitment to daily physical activity for 30 to 60  minutes, if you are able to do this.  2. Commitment to wise food choices. Aim for half of your  food intake to be vegetable and fruit, one quarter starchy foods, and one quarter protein. Try to eat on a regular schedule  3 meals per day, snacking between meals should be limited to vegetables or fruits or small portions of nuts. 64 ounces of water per day is generally recommended, unless you have specific health conditions, like heart failure or kidney failure where you will need to limit fluid intake.  3. Commitment to sufficient and a  good quality of physical and mental rest daily, generally between 6 to 8 hours per day.  WITH PERSISTANCE AND PERSEVERANCE, THE IMPOSSIBLE , BECOMES THE NORM! Thank you  for choosing [facility]. We consider it a privelige to serve you.  Delivering excellent health care in a caring and  compassionate way is our goal.  Partnering with you,  so that together we can achieve this goal is our strategy.

## 2015-12-22 ENCOUNTER — Encounter: Payer: Self-pay | Admitting: Family Medicine

## 2015-12-22 NOTE — Assessment & Plan Note (Signed)
Compliance with CPAP stressed

## 2015-12-22 NOTE — Assessment & Plan Note (Signed)
Uncpontrolled, no med change  DASH diet and commitment to daily physical activity for a minimum of 30 minutes discussed and encouraged, as a part of hypertension management. The importance of attaining a healthy weight is also discussed.  BP/Weight 12/18/2015 10/29/2015 05/22/2015 05/11/2015 01/15/2015 09/12/2014 123XX123  Systolic BP Q000111Q 123456 [id number] Q000111Q 123456 123456 [id number]  Diastolic BP 82 84 72 85 80 80 72  Wt. (Lbs) 223 225 225 - 221 210.04 208  BMI 40.14 39.86 39.87 - 39.16 37.22 38.03

## 2015-12-22 NOTE — Assessment & Plan Note (Signed)
Controlled, no change in medication Ms. Pompeo is reminded of the importance of commitment to daily physical activity for 30 minutes or more, as able and the need to limit carbohydrate intake to 30 to 60 grams per meal to help with blood sugar control.   The need to take medication as prescribed, test blood sugar as directed, and to call between visits if there is a concern that blood sugar is uncontrolled is also discussed.   Ms. Macmahon is reminded of the importance of daily foot exam, annual eye examination, and good blood sugar, blood pressure and cholesterol control.  Diabetic Labs Latest Ref Rng & Units 10/29/2015 05/20/2015 01/15/2015 09/12/2014 06/08/2014  HbA1c <5.7 % 6.4(H) - 6.9(H) 6.9(H) -  Microalbumin Not estab mg/dL - 15.4 - - -  Micro/Creat Ratio <30 mcg/mg creat - 86(H) - - -  Chol 125 - 200 mg/dL 229(H) - 183 254(H) -  HDL >=46 mg/dL 42(L) - 45(L) 42(L) -  Calc LDL <130 mg/dL 158(H) - 108 178(H) -  Triglycerides <150 mg/dL 146 - 150(H) 170(H) -  Creatinine 0.50 - 0.99 mg/dL 1.03(H) - 1.07(H) 1.17(H) 1.03   BP/Weight 12/18/2015 10/29/2015 05/22/2015 05/11/2015 01/15/2015 09/12/2014 [id number]  Systolic BP Q000111Q 123456 [id number] Q000111Q 123456 123456 [id number]  Diastolic BP 82 84 72 85 80 80 72  Wt. (Lbs) 223 225 225 - 221 210.04 208  BMI 40.14 39.86 39.87 - 39.16 37.22 38.03   Foot/eye exam completion dates Latest Ref Rng & Units 12/18/2015 09/12/2014  Eye Exam No Retinopathy - -  Foot Form Completion - Done Done

## 2015-12-22 NOTE — Assessment & Plan Note (Signed)
Deteriorated. Patient re-educated about  the importance of commitment to a  minimum of 150 minutes of exercise per week.  The importance of healthy food choices with portion control discussed. Encouraged to start a food diary, count calories and to consider  joining a support group. Sample diet sheets offered. Goals set by the patient for the next several months.   Weight /BMI 12/18/2015 10/29/2015 05/22/2015  WEIGHT 223 lb 225 lb 225 lb  HEIGHT 5' 2.5" 5\' 3"  5\' 3"   BMI 40.14 kg/m2 39.86 kg/m2 39.87 kg/m2

## 2015-12-22 NOTE — Progress Notes (Signed)
Pamela Stein     MRN: [id number]      DOB: Jan 24, 1956   HPI [name] is here for follow up and re-evaluation of chronic medical conditions, medication management and review of any available recent lab and radiology data.  Preventive health is updated, specifically  Cancer screening and Immunization.  Mammogram and gyne exam both past due Questions or concerns regarding consultations or procedures which the PT has had in the interim are  addressed. The PT denies any adverse reactions to current medications since the last visit.  There are no new concerns.  There are no specific complaints  Denies polyuria, polydipsia, blurred vision , or hypoglycemic episodes.   ROS Denies recent fever or chills. Denies sinus pressure, nasal congestion, ear pain or sore throat. Denies chest congestion, productive cough or wheezing. Denies chest pains, palpitations and leg swelling Denies abdominal pain, nausea, vomiting,diarrhea or constipation.   Denies dysuria, frequency, hesitancy or incontinence. Denies joint pain, swelling and limitation in mobility. Denies headaches, seizures, numbness, or tingling. Denies depression, anxiety or insomnia. Denies skin break down or rash.   PE  BP (!) 150/82   Pulse 87   Resp 16   Ht 5' 2.5" (1.588 m)   Wt 223 lb (101.2 kg)   SpO2 93%   BMI 40.14 kg/m   Patient alert and oriented and in no cardiopulmonary distress.  HEENT: No facial asymmetry, EOMI,   oropharynx pink and moist.  Neck supple no JVD, no mass.  Chest: Clear to auscultation bilaterally.  CVS: S1, S2 no murmurs, no S3.Regular rate.  ABD: Soft non tender.   Ext: No edema  MS: Adequate ROM spine, shoulders, hips and knees.  Skin: Intact, no ulcerations or rash noted.  Psych: Good eye contact, normal affect. Memory intact not anxious or depressed appearing.  CNS: CN 2-12 intact, power,  normal throughout.no focal deficits noted.   Assessment & Plan  Essential  hypertension Uncpontrolled, no med change  DASH diet and commitment to daily physical activity for a minimum of 30 minutes discussed and encouraged, as a part of hypertension management. The importance of attaining a healthy weight is also discussed.  BP/Weight 12/18/2015 10/29/2015 05/22/2015 05/11/2015 01/15/2015 09/12/2014 [id number]  Systolic BP Q000111Q 123456 [id number] Q000111Q 123456 123456 [id number]  Diastolic BP 82 84 72 85 80 80 72  Wt. (Lbs) 223 225 225 - 221 210.04 208  BMI 40.14 39.86 39.87 - 39.16 37.22 38.03       Diabetes mellitus Controlled, no change in medication [name] is reminded of the importance of commitment to daily physical activity for 30 minutes or more, as able and the need to limit carbohydrate intake to 30 to 60 grams per meal to help with blood sugar control.   The need to take medication as prescribed, test blood sugar as directed, and to call between visits if there is a concern that blood sugar is uncontrolled is also discussed.   [name] is reminded of the importance of daily foot exam, annual eye examination, and good blood sugar, blood pressure and cholesterol control.  Diabetic Labs Latest Ref Rng & Units 10/29/2015 05/20/2015 01/15/2015 09/12/2014 06/08/2014  HbA1c <5.7 % 6.4(H) - 6.9(H) 6.9(H) -  Microalbumin Not estab mg/dL - 15.4 - - -  Micro/Creat Ratio <30 mcg/mg creat - 86(H) - - -  Chol 125 - 200 mg/dL 229(H) - 183 254(H) -  HDL >=46 mg/dL 42(L) - 45(L) 42(L) -  Calc LDL <130 mg/dL 158(H) -  108 178(H) -  Triglycerides <150 mg/dL 146 - 150(H) 170(H) -  Creatinine 0.50 - 0.99 mg/dL 1.03(H) - 1.07(H) 1.17(H) 1.03   BP/Weight 12/18/2015 10/29/2015 05/22/2015 05/11/2015 01/15/2015 09/12/2014 [id number]  Systolic BP Q000111Q 123456 [id number] Q000111Q 123456 123456 [id number]  Diastolic BP 82 84 72 85 80 80 72  Wt. (Lbs) 223 225 225 - 221 210.04 208  BMI 40.14 39.86 39.87 - 39.16 37.22 38.03   Foot/eye exam completion dates Latest Ref Rng & Units 12/18/2015 09/12/2014  Eye Exam No Retinopathy - -  Foot Form Completion  - Done Done        Morbid obesity Deteriorated. Patient re-educated about  the importance of commitment to a  minimum of 150 minutes of exercise per week.  The importance of healthy food choices with portion control discussed. Encouraged to start a food diary, count calories and to consider  joining a support group. Sample diet sheets offered. Goals set by the patient for the next several months.   Weight /BMI 12/18/2015 10/29/2015 05/22/2015  WEIGHT 223 lb 225 lb 225 lb  HEIGHT 5' 2.5" 5\' 3"  5\' 3"   BMI 40.14 kg/m2 39.86 kg/m2 39.87 kg/m2      Hyperlipidemia LDL goal <100 Hyperlipidemia:Low fat diet discussed and encouraged.   Lipid Panel  Lab Results  Component Value Date   CHOL 229 (H) 10/29/2015   HDL 42 (L) 10/29/2015   LDLCALC 158 (H) 10/29/2015   TRIG 146 10/29/2015   CHOLHDL 5.5 (H) 10/29/2015   Uncontrolled Updated lab needed at/ before next visit.     Obstructive sleep apnea Compliance with CPAP stressed  Vitamin D deficiency Updated lab needed at/ before next visit.

## 2015-12-22 NOTE — Assessment & Plan Note (Signed)
Hyperlipidemia:Low fat diet discussed and encouraged.   Lipid Panel  Lab Results  Component Value Date   CHOL 229 (H) 10/29/2015   HDL 42 (L) 10/29/2015   LDLCALC 158 (H) 10/29/2015   TRIG 146 10/29/2015   CHOLHDL 5.5 (H) 10/29/2015   Uncontrolled Updated lab needed at/ before next visit.

## 2015-12-22 NOTE — Assessment & Plan Note (Signed)
Updated lab needed at/ before next visit.   

## 2016-02-19 ENCOUNTER — Encounter: Payer: Self-pay | Admitting: Family Medicine

## 2016-02-19 ENCOUNTER — Ambulatory Visit (INDEPENDENT_AMBULATORY_CARE_PROVIDER_SITE_OTHER): Payer: Federal, State, Local not specified - PPO | Admitting: Family Medicine

## 2016-02-19 VITALS — BP 162/84 | HR 87 | Resp 16 | Ht 63.0 in | Wt 220.0 lb

## 2016-02-19 DIAGNOSIS — I10 Essential (primary) hypertension: Secondary | ICD-10-CM

## 2016-02-19 DIAGNOSIS — G4733 Obstructive sleep apnea (adult) (pediatric): Secondary | ICD-10-CM

## 2016-02-19 DIAGNOSIS — E785 Hyperlipidemia, unspecified: Secondary | ICD-10-CM

## 2016-02-19 MED ORDER — SPIRONOLACTONE 50 MG PO TABS: 50.0000 mg | ORAL_TABLET | Freq: Every day | ORAL | 4 refills | Status: DC

## 2016-02-19 NOTE — Patient Instructions (Addendum)
Nurse BP check in 4 weeks  Non fast chem 7 and egFR in 4 weeks   MD follow up in 3.5 months  INCREASE in dose of spironolactone 50 mg one daily, OK to take TWO 32 mg capsules once daily till done  Please commit to spending more time and energy taking care of yourself, you're worth it!  It is important that you exercise regularly at least 30 minutes 5 times a week. If you develop chest pain, have severe difficulty breathing, or feel very tired, stop exercising immediately and seek medical attention   Please continue to work on weight loss  Thank you  for choosing [facility]. We consider it a privelige to serve you.  Delivering excellent health care in a caring and  compassionate way is our goal.  Partnering with you,  so that together we can achieve this goal is our strategy.

## 2016-02-23 ENCOUNTER — Encounter: Payer: Self-pay | Admitting: Family Medicine

## 2016-02-23 NOTE — Assessment & Plan Note (Signed)
Uncontrolled, stressed, noin compliant with treatment . Recheck in 3 weeks prior to any med adjustment DASH diet and commitment to daily physical activity for a minimum of 30 minutes discussed and encouraged, as a part of hypertension management. The importance of attaining a healthy weight is also discussed.  BP/Weight 02/19/2016 12/18/2015 10/29/2015 05/22/2015 05/11/2015 [id number]  Systolic BP 0000000 Q000111Q 123456 [id number] Q000111Q 123456 123456  Diastolic BP 84 82 84 72 85 80 80  Wt. (Lbs) 220 223 225 225 - 221 210.04  BMI 38.97 40.14 39.86 39.87 - 39.16 37.22

## 2016-02-23 NOTE — Assessment & Plan Note (Signed)
Improved. Pt applauded on succesful weight loss through lifestyle change, and encouraged to continue same. Weight loss goal set for the next several months.  

## 2016-02-23 NOTE — Assessment & Plan Note (Signed)
Hyperlipidemia:Low fat diet discussed and encouraged.   Lipid Panel  Lab Results  Component Value Date   CHOL 229 (H) 10/29/2015   HDL 42 (L) 10/29/2015   LDLCALC 158 (H) 10/29/2015   TRIG 146 10/29/2015   CHOLHDL 5.5 (H) 10/29/2015  uncontrolled  Updated lab needed at/ before next visit. Med adherence stressed

## 2016-02-23 NOTE — Assessment & Plan Note (Signed)
Importance of compliance stressed to improve health and lower BP also

## 2016-02-23 NOTE — Progress Notes (Signed)
   Pamela Stein     MRN: [id number]      DOB: [birth year]   HPI Pamela Stein is here for follow up and re-evaluation of chronic medical conditions, medication management and review of any available recent lab and radiology data.  Preventive health is updated, specifically  Cancer screening and Immunization.   Questions or concerns regarding consultations or procedures which the PT has had in the interim are  addressed. The PT denies any adverse reactions to current medications since the last visit.  Still stressed caring for everyone but herself, no regular exercise, will change this Denies polyuria, polydipsia, blurred vision , or hypoglycemic episodes.   ROS Denies recent fever or chills. Denies sinus pressure, nasal congestion, ear pain or sore throat. Denies chest congestion, productive cough or wheezing. Denies chest pains, palpitations and leg swelling Denies abdominal pain, nausea, vomiting,diarrhea or constipation.   Denies dysuria, frequency, hesitancy or incontinence. Denies joint pain, swelling and limitation in mobility. Denies headaches, seizures, numbness, or tingling. Denies depression, anxiety or insomnia. Denies skin break down or rash.   PE  BP (!) 162/84   Pulse 87   Resp 16   Ht 5\' 3"  (1.6 m)   Wt 220 lb (99.8 kg)   SpO2 96%   BMI 38.97 kg/m   Patient alert and oriented and in no cardiopulmonary distress.  HEENT: No facial asymmetry, EOMI,   oropharynx pink and moist.  Neck supple no JVD, no mass.  Chest: Clear to auscultation bilaterally.  CVS: S1, S2 no murmurs, no S3.Regular rate.  ABD: Soft non tender.   Ext: No edema  MS: Adequate ROM spine, shoulders, hips and knees.  Skin: Intact, no ulcerations or rash noted.  Psych: Good eye contact, normal affect. Memory intact not anxious or depressed appearing.  CNS: CN 2-12 intact, power,  normal throughout.no focal deficits noted.   Assessment & Plan  Essential hypertension Uncontrolled,  stressed, noin compliant with treatment . Recheck in 3 weeks prior to any med adjustment DASH diet and commitment to daily physical activity for a minimum of 30 minutes discussed and encouraged, as a part of hypertension management. The importance of attaining a healthy weight is also discussed.  BP/Weight 02/19/2016 12/18/2015 10/29/2015 05/22/2015 05/11/2015 [id number]  Systolic BP 0000000 Q000111Q 123456 [id number] Q000111Q 123456 123456  Diastolic BP 84 82 84 72 85 80 80  Wt. (Lbs) 220 223 225 225 - 221 210.04  BMI 38.97 40.14 39.86 39.87 - 39.16 37.22       Morbid obesity Improved. Pt applauded on succesful weight loss through lifestyle change, and encouraged to continue same. Weight loss goal set for the next several months.   Obstructive sleep apnea Importance of compliance stressed to improve health and lower BP also  Hyperlipidemia LDL goal <100 Hyperlipidemia:Low fat diet discussed and encouraged.   Lipid Panel  Lab Results  Component Value Date   CHOL 229 (H) 10/29/2015   HDL 42 (L) 10/29/2015   LDLCALC 158 (H) 10/29/2015   TRIG 146 10/29/2015   CHOLHDL 5.5 (H) 10/29/2015  uncontrolled  Updated lab needed at/ before next visit. Med adherence stressed

## 2016-03-06 ENCOUNTER — Other Ambulatory Visit: Payer: Self-pay | Admitting: Family Medicine

## 2016-03-09 DIAGNOSIS — I10 Essential (primary) hypertension: Secondary | ICD-10-CM | POA: Diagnosis not present

## 2016-03-10 LAB — BASIC METABOLIC PANEL WITH GFR
BUN: 15 mg/dL (ref 7–25)
CHLORIDE: 106 mmol/L (ref 98–110)
CO2: 28 mmol/L (ref 20–31)
CREATININE: 1.15 mg/dL — AB (ref 0.50–0.99)
Calcium: 10.6 mg/dL — ABNORMAL HIGH (ref 8.6–10.4)
GFR, EST NON AFRICAN AMERICAN: 51 mL/min — AB (ref >=60)
GFR, Est African American: 59 mL/min — ABNORMAL LOW (ref >=60)
GLUCOSE: 95 mg/dL (ref 65–99)
Potassium: 4.4 mmol/L (ref 3.5–5.3)
SODIUM: 139 mmol/L (ref 135–146)

## 2016-03-23 ENCOUNTER — Ambulatory Visit: Payer: Federal, State, Local not specified - PPO

## 2016-03-23 VITALS — BP 124/80

## 2016-03-23 DIAGNOSIS — I10 Essential (primary) hypertension: Secondary | ICD-10-CM

## 2016-03-23 NOTE — Progress Notes (Signed)
Patient advised to continue the same medication and keep her next follow up appt

## 2016-04-14 ENCOUNTER — Other Ambulatory Visit: Payer: Self-pay | Admitting: Family Medicine

## 2016-04-14 DIAGNOSIS — Z1231 Encounter for screening mammogram for malignant neoplasm of breast: Secondary | ICD-10-CM

## 2016-04-24 ENCOUNTER — Ambulatory Visit (HOSPITAL_COMMUNITY): Payer: Self-pay

## 2016-04-28 ENCOUNTER — Other Ambulatory Visit: Payer: Federal, State, Local not specified - PPO | Admitting: Adult Health

## 2016-04-29 ENCOUNTER — Ambulatory Visit (HOSPITAL_COMMUNITY)
Admission: RE | Admit: 2016-04-29 | Discharge: 2016-04-29 | Disposition: A | Discharge status: Home / Self Care | Payer: Federal, State, Local not specified - PPO | Source: Ambulatory Visit | Attending: Family Medicine | Admitting: Family Medicine

## 2016-04-29 DIAGNOSIS — Z1231 Encounter for screening mammogram for malignant neoplasm of breast: Secondary | ICD-10-CM

## 2016-04-29 IMAGING — MG DIGITAL SCREENING BILATERAL MAMMOGRAM WITH CAD
5 series · 5 of 5 positions shown · non-contrast
Comparison: Previous exam(s).

CLINICAL DATA: Screening.

EXAM:
DIGITAL SCREENING BILATERAL MAMMOGRAM WITH CAD

[L MLO]
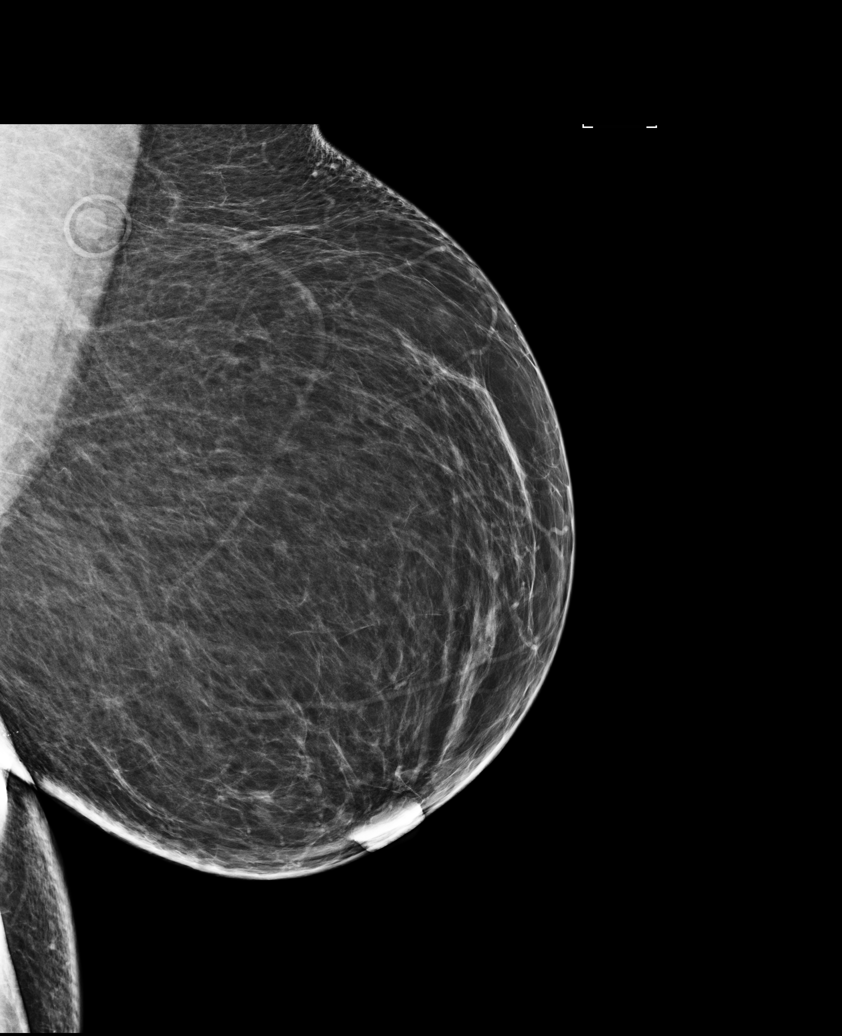

[R CC (1 of 2)]
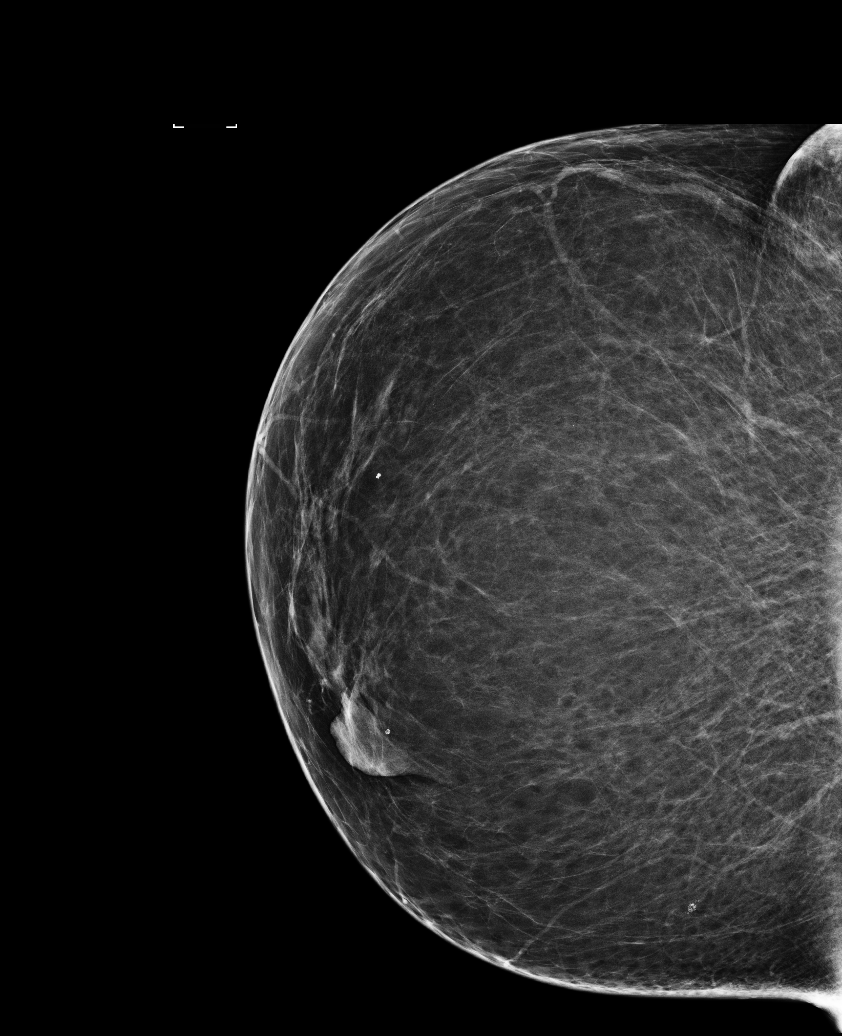

[R MLO]
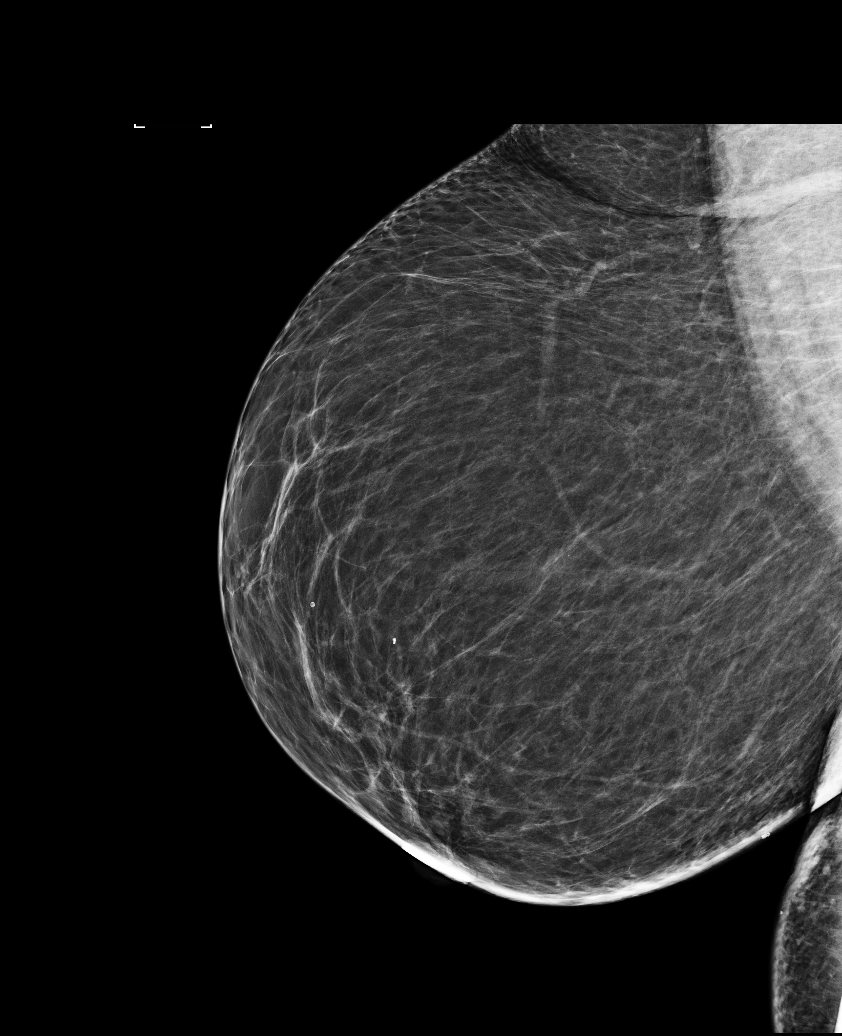

[R CC (2 of 2)]
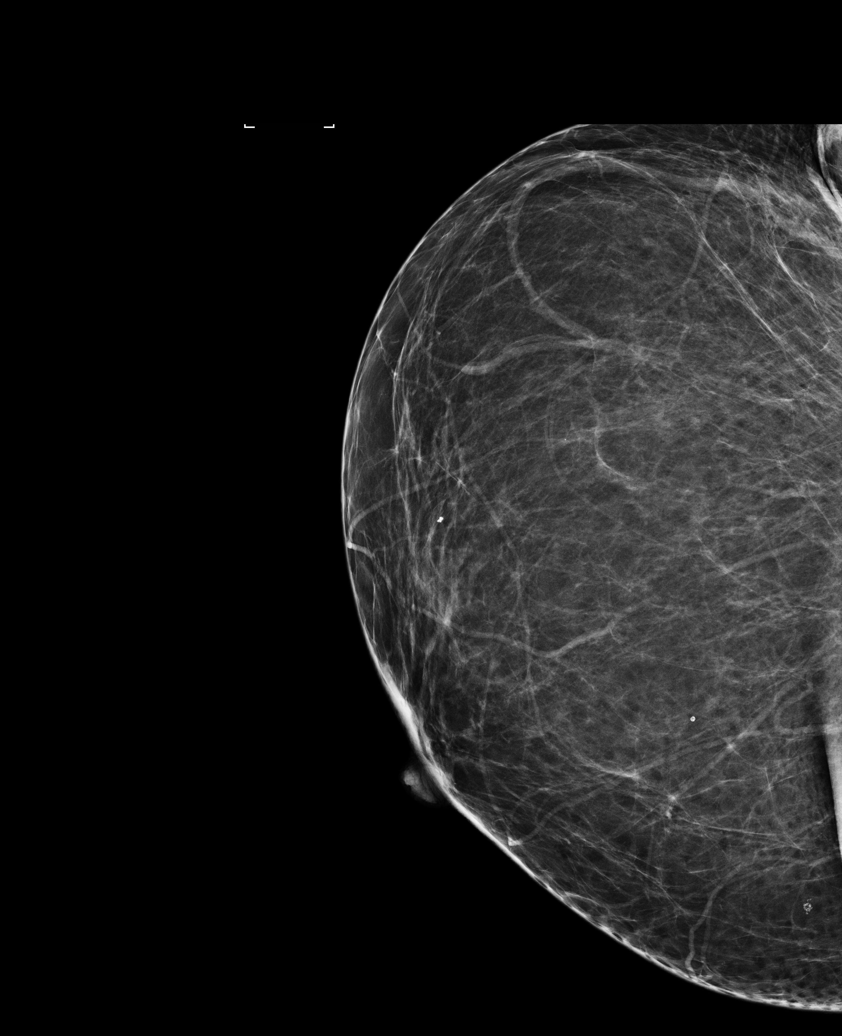

[L CC]
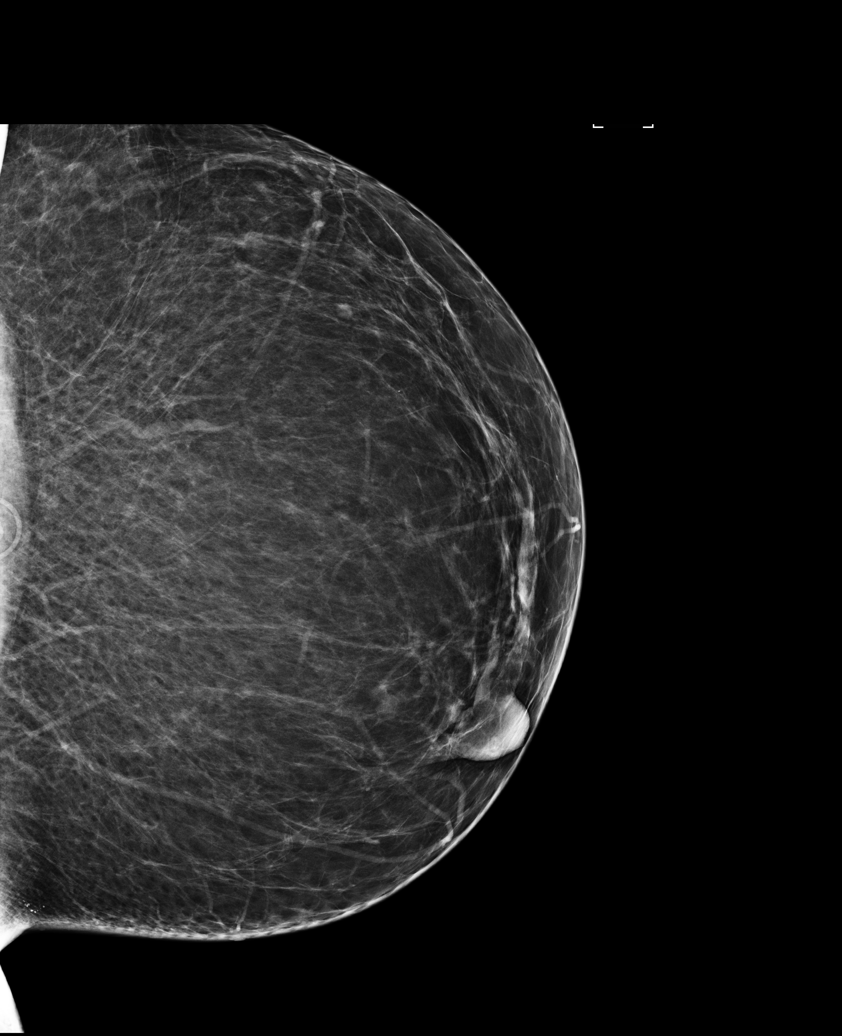

[5 of 5 positions shown; findings below may reference images not displayed]

ACR Breast Density Category b: There are scattered areas of
fibroglandular density.
FINDINGS: There are no findings suspicious for malignancy. Images were
processed with CAD.
IMPRESSION: No mammographic evidence of malignancy. A result letter of this
screening mammogram will be mailed directly to the patient.

RECOMMENDATION:
Screening mammogram in one year. (Code:[US])

BI-RADS CATEGORY  1: Negative.

## 2016-05-06 ENCOUNTER — Other Ambulatory Visit (HOSPITAL_COMMUNITY)
Admission: RE | Admit: 2016-05-06 | Discharge: 2016-05-06 | Disposition: A | Discharge status: Home / Self Care | Payer: Federal, State, Local not specified - PPO | Source: Ambulatory Visit | Attending: Adult Health | Admitting: Adult Health

## 2016-05-06 ENCOUNTER — Encounter: Payer: Self-pay | Admitting: Adult Health

## 2016-05-06 ENCOUNTER — Ambulatory Visit (INDEPENDENT_AMBULATORY_CARE_PROVIDER_SITE_OTHER): Payer: Federal, State, Local not specified - PPO | Admitting: Adult Health

## 2016-05-06 VITALS — BP 160/100 | HR 91 | Ht 61.5 in | Wt 220.0 lb

## 2016-05-06 DIAGNOSIS — Z1211 Encounter for screening for malignant neoplasm of colon: Secondary | ICD-10-CM | POA: Diagnosis not present

## 2016-05-06 DIAGNOSIS — F329 Major depressive disorder, single episode, unspecified: Secondary | ICD-10-CM | POA: Diagnosis not present

## 2016-05-06 DIAGNOSIS — F32A Depression, unspecified: Secondary | ICD-10-CM

## 2016-05-06 DIAGNOSIS — Z01419 Encounter for gynecological examination (general) (routine) without abnormal findings: Secondary | ICD-10-CM | POA: Diagnosis not present

## 2016-05-06 DIAGNOSIS — Z1212 Encounter for screening for malignant neoplasm of rectum: Secondary | ICD-10-CM | POA: Diagnosis not present

## 2016-05-06 DIAGNOSIS — Z1151 Encounter for screening for human papillomavirus (HPV): Secondary | ICD-10-CM | POA: Diagnosis not present

## 2016-05-06 LAB — HEMOCCULT GUIAC POC 1CARD (OFFICE): FECAL OCCULT BLD: NEGATIVE

## 2016-05-06 MED ORDER — ESCITALOPRAM OXALATE 10 MG PO TABS: 10.0000 mg | ORAL_TABLET | Freq: Every day | ORAL | 6 refills | Status: DC

## 2016-05-06 NOTE — Progress Notes (Addendum)
Patient ID: Pamela Stein, female   DOB: 1955-05-17, 61 y.o.   MRN: [id number] History of Present Illness: Pamela Stein is a 61 year old black female in for well woman gyn exam and pap, she is sp supracervical hysterectomy.She says she has not taken care of her self due to looking after 63 yo relative. PCP is Dr Moshe Cipro.    Current Medications, Allergies, Past Medical History, Past Surgical History, Family History and Social History were reviewed in [employer] record.     Review of Systems: Patient denies any headaches, hearing loss, fatigue, blurred vision, shortness of breath, chest pain, abdominal pain, problems with bowel movements, urination, or intercourse(not having sex). No joint pain or mood swings. Stressed over having to care for 63 yo relative, and she works third shift at [employer].   Physical Exam:BP (!) 160/100 (BP Location: Left Arm, Cuff Size: Normal)   Pulse 91   Ht 5' 1.5" (1.562 m)   Wt 220 lb (99.8 kg)   BMI 40.90 kg/m She has not taken BP meds today yet. General:  Well developed, well nourished, no acute distress Skin:  Warm and dry Neck:  Midline trachea, normal thyroid, good ROM, no lymphadenopathy,no carotid bruits heard Lungs; Clear to auscultation bilaterally Breast:  No dominant palpable mass, retraction, or nipple discharge Cardiovascular: Regular rate and rhythm Abdomen:  Soft, non tender, no hepatosplenomegaly Pelvic:  External genitalia is normal in appearance, no lesions.  The vagina is normal in appearance. Urethra has no lesions or masses. The cervix is smooth, pap with HPV performed.  Uterus is absent.  No adnexal masses or tenderness noted.Bladder is non tender, no masses felt. Rectal: Good sphincter tone, no polyps, or hemorrhoids felt.  Hemoccult negative. Extremities/musculoskeletal:  No swelling or varicosities noted, no clubbing or cyanosis Psych:  No mood changes, alert and cooperative,seems happy PHQ 9 score 7, she is  depressed, but denies being suicidal, discussed meds and counseling for care givers and she wants to try both.  Impression: 1. Encounter for gynecological examination with Papanicolaou smear of cervix   2. Screening for colorectal cancer   3. Depression, unspecified depression type       Plan: Rx lexapro 10 mg #30 take 1 daily with 6 refills Counseling encouraged Return in 6 weeks for F/U on lexapro, if good can cancel with me and just F/U with Dr Moshe Cipro Physical in 1 year Pap in 3 if normal Mammogram yearly Labs with Dr Moshe Cipro F/U with Dr Moshe Cipro on BP

## 2016-05-08 LAB — CYTOLOGY - PAP
ADEQUACY: ABSENT
DIAGNOSIS: NEGATIVE
HPV (WINDOPATH): NOT DETECTED

## 2016-05-20 ENCOUNTER — Other Ambulatory Visit: Payer: Self-pay | Admitting: Family Medicine

## 2016-05-20 ENCOUNTER — Ambulatory Visit (HOSPITAL_COMMUNITY)
Admission: EM | Admit: 2016-05-20 | Discharge: 2016-05-20 | Disposition: A | Discharge status: Home / Self Care | Payer: Federal, State, Local not specified - PPO | Attending: Family Medicine | Admitting: Family Medicine

## 2016-05-20 ENCOUNTER — Encounter (HOSPITAL_COMMUNITY): Payer: Self-pay | Admitting: Emergency Medicine

## 2016-05-20 ENCOUNTER — Telehealth: Payer: Self-pay | Admitting: Family Medicine

## 2016-05-20 DIAGNOSIS — B9789 Other viral agents as the cause of diseases classified elsewhere: Secondary | ICD-10-CM

## 2016-05-20 DIAGNOSIS — J069 Acute upper respiratory infection, unspecified: Secondary | ICD-10-CM

## 2016-05-20 DIAGNOSIS — J40 Bronchitis, not specified as acute or chronic: Secondary | ICD-10-CM | POA: Diagnosis not present

## 2016-05-20 MED ORDER — ALBUTEROL SULFATE HFA 108 (90 BASE) MCG/ACT IN AERS: 1.0000 | INHALATION_SPRAY | Freq: Four times a day (QID) | RESPIRATORY_TRACT | 0 refills | Status: DC | PRN

## 2016-05-20 MED ORDER — PREDNISONE 10 MG (21) PO TBPK: ORAL_TABLET | Freq: Every day | ORAL | 0 refills | Status: DC

## 2016-05-20 NOTE — ED Provider Notes (Signed)
CSN: [id number]     Arrival date & time 05/20/16  1856 History   First MD Initiated Contact with Patient 05/20/16 1927     Chief Complaint  Patient presents with  . Cough   (Consider location/radiation/quality/duration/timing/severity/associated sxs/prior Treatment) Pt in for cough since yesterday after being [place] [employer]. Pt states that this is the time every year that he sinuses cause her problems. Denies any sob. Nonproductive phlem with cough. No fever. Has not taken anything pta.        Past Medical History:  Diagnosis Date  . Back pain   . Diabetes mellitus   . Hyperlipidemia   . Hypertension   . Obesity   . Obstructive sleep apnea    Past Surgical History:  Procedure Laterality Date  . ABDOMINAL HYSTERECTOMY    . CATARACT EXTRACTION Left   . DILATION AND CURETTAGE OF UTERUS    . ENDOMETRIAL ABLATION    . EYE SURGERY Left 12/04/2013   cataract  . TONSILLECTOMY     Family History  Problem Relation Age of Onset  . Cancer Father     throat  . Diabetes Maternal Grandmother   . Hypertension Maternal Grandfather   . Hypertension Paternal Grandmother   . Diabetes Paternal Grandfather    Social History  Substance Use Topics  . Smoking status: Never Smoker  . Smokeless tobacco: Never Used  . Alcohol use No   OB History    Gravida Para Term Preterm AB Living   1       1     SAB TAB Ectopic Multiple Live Births   1             Review of Systems  Constitutional: Negative.   HENT: Positive for rhinorrhea.   Eyes: Negative.   Respiratory: Positive for cough.   Cardiovascular: Negative.   Gastrointestinal: Negative.   Genitourinary: Negative.   Musculoskeletal: Negative.   Skin: Negative.   Neurological: Negative.     Allergies  Ace inhibitors  Home Medications   Prior to Admission medications   Medication Sig Start Date End Date Taking? Authorizing Provider  ACCU-CHEK AVIVA PLUS test strip USE AS DIRECTED TO TEST TWICE DAILY    Fayrene Helper, MD  ACCU-CHEK SOFTCLIX LANCETS lancets USE TO DRAW BLOOD FOR GLUCOSE TEST 12/05/10   Fayrene Helper, MD  albuterol (PROVENTIL HFA;VENTOLIN HFA) 108 (90 Base) MCG/ACT inhaler Inhale 1-2 puffs into the lungs every 6 (six) hours as needed for wheezing or shortness of breath. 05/20/16   Melanee Left, NP  allopurinol (ZYLOPRIM) 300 MG tablet TAKE 1 TABLET(300 MG) BY MOUTH DAILY 11/01/15   Fayrene Helper, MD  amLODipine (NORVASC) 10 MG tablet TAKE 1 TABLET BY MOUTH DAILY 11/01/15   Fayrene Helper, MD  aspirin 81 MG tablet Take 81 mg by mouth daily.      Historical Provider, MD  b complex vitamins tablet Take 1 tablet by mouth daily.    Historical Provider, MD  ergocalciferol (VITAMIN D2) 50000 units capsule Take 1 capsule (50,000 Units total) by mouth once a week. One capsule once weekly 05/22/15   Fayrene Helper, MD  escitalopram (LEXAPRO) 10 MG tablet Take 1 tablet (10 mg total) by mouth daily. 05/06/16   Estill Dooms, NP  gabapentin (NEURONTIN) 300 MG capsule Take 1 capsule (300 mg total) by mouth 3 (three) times daily. 10/29/15   Fayrene Helper, MD  metFORMIN (GLUCOPHAGE) 500 MG tablet TAKE 1 TABLET  BY MOUTH TWICE DAILY WITH A MEAL Patient taking differently: daily 11/01/15   Fayrene Helper, MD  multivitamin [facility]) per tablet Take 1 tablet by mouth daily.      Historical Provider, MD  pravastatin (PRAVACHOL) 80 MG tablet TAKE 1 TABLET(80 MG) BY MOUTH EVERY EVENING 12/04/15   Fayrene Helper, MD  predniSONE (STERAPRED UNI-PAK 21 TAB) 10 MG (21) TBPK tablet Take by mouth daily. Take 6 tabs by mouth daily  for 2 days, then 5 tabs for 2 days, then 4 tabs for 2 days, then 3 tabs for 2 days, 2 tabs for 2 days, then 1 tab by mouth daily for 2 days 05/20/16   Melanee Left, NP  spironolactone (ALDACTONE) 50 MG tablet Take 1 tablet (50 mg total) by mouth daily. 02/19/16   Fayrene Helper, MD   Meds Ordered and Administered this Visit  Medications - No data  to display  BP (!) 153/81 (BP Location: Left Arm) Comment (BP Location): large cuff  Pulse 84   Temp 98.2 F (36.8 C) (Oral)   Resp 18   SpO2 96%  No data found.   Physical Exam  Constitutional: She appears well-developed.  HENT:  Head: Normocephalic.  Eyes: Pupils are equal, round, and reactive to light.  Neck: Normal range of motion.  Cardiovascular: Normal rate.   Pulmonary/Chest: Effort normal and breath sounds normal.  Non productive cough   Abdominal: Soft. Bowel sounds are normal.  Musculoskeletal: Normal range of motion.  Neurological: She is alert.  Skin: Skin is warm.    Urgent Care Course     Procedures (including critical care time)  Labs Review Labs Reviewed - No data to display  Imaging Review No results found.           MDM   1. Viral URI with cough   2. Bronchitis    Monitor your sugars the steroid medication can cause elevation If you begin to have sob go to the ER Follow up with your pcp this week Discussed reasons for cause Expressed if a fever appears to go to the er     Melanee Left, NP 05/20/16 1939

## 2016-05-20 NOTE — Discharge Instructions (Signed)
Monitor your sugars the steroid medication can cause elevation If you begin to have sob go to the ER Follow up with your pcp this week

## 2016-05-20 NOTE — Telephone Encounter (Signed)
Noted, aware of the encounter and as pt demnded records and refused appointment given her wish is being respectted

## 2016-05-20 NOTE — Telephone Encounter (Signed)
Pamela Stein called c/o bronchitis and she is and allergies and she asked if Dr. Moshe Cipro would call her in a refill on her inhaler and an antibiotic and I explained that the Doctors no longer call in antibiotics without seeing the patient in the office first and I offered her an appointment with Dr. Meda Coffee on Thursday April 12th and she denied that appointment stated she wanted to see Dr Moshe Cipro only and I explained to her that it would be the 1st week of May before Dr. Moshe Cipro had an opening and she got very mad and raising her voice demanding medical records and I got her to sign a Medical Record Release for [facility] and she signed and that was then given to [place] [facility]) for completion.

## 2016-05-20 NOTE — ED Triage Notes (Signed)
Head and chest congestion.  Denies fever.  Patient has a runny nose.  Onset of symptoms late Saturday night

## 2016-06-02 ENCOUNTER — Ambulatory Visit: Payer: Self-pay | Admitting: Family Medicine

## 2016-06-04 ENCOUNTER — Other Ambulatory Visit: Payer: Self-pay | Admitting: Family Medicine

## 2016-06-30 DIAGNOSIS — I10 Essential (primary) hypertension: Secondary | ICD-10-CM | POA: Diagnosis not present

## 2016-06-30 DIAGNOSIS — J42 Unspecified chronic bronchitis: Secondary | ICD-10-CM | POA: Diagnosis not present

## 2016-06-30 DIAGNOSIS — E119 Type 2 diabetes mellitus without complications: Secondary | ICD-10-CM | POA: Diagnosis not present

## 2016-07-01 DIAGNOSIS — K080 Exfoliation of teeth due to systemic causes: Secondary | ICD-10-CM | POA: Diagnosis not present

## 2016-07-14 DIAGNOSIS — I10 Essential (primary) hypertension: Secondary | ICD-10-CM | POA: Diagnosis not present

## 2016-07-14 DIAGNOSIS — E119 Type 2 diabetes mellitus without complications: Secondary | ICD-10-CM | POA: Diagnosis not present

## 2016-07-14 DIAGNOSIS — E784 Other hyperlipidemia: Secondary | ICD-10-CM | POA: Diagnosis not present

## 2016-07-20 DIAGNOSIS — E119 Type 2 diabetes mellitus without complications: Secondary | ICD-10-CM | POA: Diagnosis not present

## 2016-07-20 DIAGNOSIS — E784 Other hyperlipidemia: Secondary | ICD-10-CM | POA: Diagnosis not present

## 2016-07-20 DIAGNOSIS — I10 Essential (primary) hypertension: Secondary | ICD-10-CM | POA: Diagnosis not present

## 2016-08-05 ENCOUNTER — Other Ambulatory Visit: Payer: Self-pay | Admitting: Family Medicine

## 2016-08-09 DIAGNOSIS — J22 Unspecified acute lower respiratory infection: Secondary | ICD-10-CM | POA: Diagnosis not present

## 2016-08-09 DIAGNOSIS — J019 Acute sinusitis, unspecified: Secondary | ICD-10-CM | POA: Diagnosis not present

## 2016-08-09 DIAGNOSIS — R0602 Shortness of breath: Secondary | ICD-10-CM | POA: Diagnosis not present

## 2016-08-09 DIAGNOSIS — B9689 Other specified bacterial agents as the cause of diseases classified elsewhere: Secondary | ICD-10-CM | POA: Diagnosis not present

## 2016-08-09 DIAGNOSIS — E119 Type 2 diabetes mellitus without complications: Secondary | ICD-10-CM | POA: Diagnosis not present

## 2016-08-13 ENCOUNTER — Telehealth: Payer: Self-pay | Admitting: Internal Medicine

## 2016-08-13 NOTE — Telephone Encounter (Signed)
Letter mailed to pt.  

## 2016-08-13 NOTE — Telephone Encounter (Signed)
RECALL FOR TCS °

## 2016-08-17 DIAGNOSIS — J019 Acute sinusitis, unspecified: Secondary | ICD-10-CM | POA: Diagnosis not present

## 2016-08-17 DIAGNOSIS — J22 Unspecified acute lower respiratory infection: Secondary | ICD-10-CM | POA: Diagnosis not present

## 2016-08-17 DIAGNOSIS — B9689 Other specified bacterial agents as the cause of diseases classified elsewhere: Secondary | ICD-10-CM | POA: Diagnosis not present

## 2016-08-17 DIAGNOSIS — E119 Type 2 diabetes mellitus without complications: Secondary | ICD-10-CM | POA: Diagnosis not present

## 2016-08-18 DIAGNOSIS — E210 Primary hyperparathyroidism: Secondary | ICD-10-CM | POA: Diagnosis not present

## 2016-08-19 ENCOUNTER — Other Ambulatory Visit (HOSPITAL_COMMUNITY): Payer: Self-pay | Admitting: General Surgery

## 2016-08-19 DIAGNOSIS — E210 Primary hyperparathyroidism: Secondary | ICD-10-CM

## 2016-08-31 DIAGNOSIS — H5340 Unspecified visual field defects: Secondary | ICD-10-CM | POA: Diagnosis not present

## 2016-08-31 DIAGNOSIS — H35363 Drusen (degenerative) of macula, bilateral: Secondary | ICD-10-CM | POA: Diagnosis not present

## 2016-09-02 ENCOUNTER — Encounter (HOSPITAL_COMMUNITY)
Admission: RE | Admit: 2016-09-02 | Discharge: 2016-09-02 | Disposition: A | Discharge status: Home / Self Care | Payer: Federal, State, Local not specified - PPO | Source: Ambulatory Visit | Attending: General Surgery | Admitting: General Surgery

## 2016-09-02 ENCOUNTER — Encounter (HOSPITAL_COMMUNITY): Payer: Federal, State, Local not specified - PPO

## 2016-09-02 DIAGNOSIS — E213 Hyperparathyroidism, unspecified: Secondary | ICD-10-CM | POA: Diagnosis not present

## 2016-09-02 DIAGNOSIS — E210 Primary hyperparathyroidism: Secondary | ICD-10-CM | POA: Insufficient documentation

## 2016-09-02 IMAGING — NM NM PARATHYROID W/ SPECT
7 series · 22 of 22 positions shown · non-contrast
Comparison: None

CLINICAL DATA: Hypercalcemia, primary hyperparathyroidism, diabetes
mellitus, hypertension

EXAM:
NM PARATHYROID SCINTIGRAPHY AND SPECT IMAGING
TECHNIQUE: Following intravenous administration of radiopharmaceutical, early
and 2-hour delayed planar images were obtained in the anterior
projection. Delayed triplanar SPECT images were also obtained at 2
hours.
RADIOPHARMACEUTICALS:  20.8 mCi [4O] Sestamibi IV

[Series 1: spect - (id)_(id)_cor · 8.3mm · 8.28mm/px · 6 of 64 frames shown]
[frame 6/64]
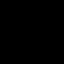
[frame 16/64]
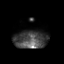
[frame 27/64]
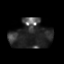
[frame 38/64]
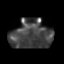
[frame 48/64]
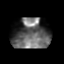
[frame 59/64]
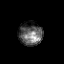

[Series 1: wbr_bone_60 15 min ant · 4.14mm/px · 1 of 1 slices shown]
[im 1/1]
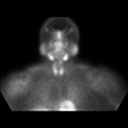

[Series 1: spect - (id)_(id)_tra · 8.3mm · 8.28mm/px · 6 of 64 frames shown]
[frame 6/64]
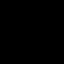
[frame 16/64]
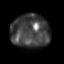
[frame 27/64]
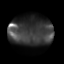
[frame 38/64]
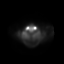
[frame 48/64]
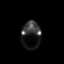
[frame 59/64]
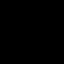

[Series 1: 15 min ant · 4.14mm/px · 1 of 1 slices shown]
[im 1/1]
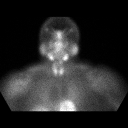

[Series 2: wbr_bone_60 2 hr ant · 4.14mm/px · 1 of 1 slices shown]
[im 1/1]
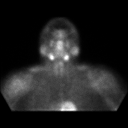

[Series 2: 2 hr ant · 4.14mm/px · 1 of 1 slices shown]
[im 1/1]
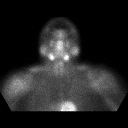

[Series 3: spect parathyroid · 8.28mm/px · 6 of 64 frames shown]
[frame 6/64]
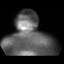
[frame 16/64]
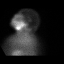
[frame 27/64]
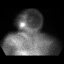
[frame 38/64]
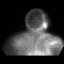
[frame 48/64]
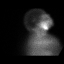
[frame 59/64]
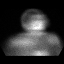

[22 of 22 positions shown; findings below may reference images not displayed]

FINDINGS: Planar imaging: Initial anterior distribution of sestamibi in the
thyroid lobes. Normal washout of tracer from the thyroid lobes
without abnormal sestamibi retention to suggest parathyroid adenoma.

SPECT imaging: No definite abnormal sestamibi retention at the
thyroid lobes to suggest parathyroid adenoma. No ectopic sestamibi
localization in the mediastinum.
IMPRESSION: Negative parathyroid scan.

## 2016-09-02 MED ORDER — TECHNETIUM TC 99M SESTAMIBI GENERIC - CARDIOLITE
20.0000 | Freq: Once | INTRAVENOUS | Status: AC | PRN
  Administered 2016-09-02: 20 via INTRAVENOUS

## 2016-09-17 ENCOUNTER — Other Ambulatory Visit: Payer: Self-pay | Admitting: General Surgery

## 2016-09-17 DIAGNOSIS — E210 Primary hyperparathyroidism: Secondary | ICD-10-CM

## 2016-09-23 ENCOUNTER — Other Ambulatory Visit: Payer: Self-pay

## 2016-09-24 ENCOUNTER — Telehealth: Payer: Self-pay

## 2016-09-24 ENCOUNTER — Other Ambulatory Visit: Payer: Self-pay

## 2016-09-24 NOTE — Telephone Encounter (Signed)
PT was on Recall. Last colonoscopy was 09/27/2006 by Dr. Gala Romney. LMOM for a return call.

## 2016-09-24 NOTE — Telephone Encounter (Signed)
Pt is calling to set up TCS. I told her to call back between 1:30-4:30 pm for Doris.

## 2016-09-28 ENCOUNTER — Ambulatory Visit
Admission: RE | Admit: 2016-09-28 | Discharge: 2016-09-28 | Disposition: A | Discharge status: Home / Self Care | Payer: Federal, State, Local not specified - PPO | Source: Ambulatory Visit | Attending: General Surgery | Admitting: General Surgery

## 2016-09-28 DIAGNOSIS — E213 Hyperparathyroidism, unspecified: Secondary | ICD-10-CM | POA: Diagnosis not present

## 2016-09-28 DIAGNOSIS — E210 Primary hyperparathyroidism: Secondary | ICD-10-CM

## 2016-09-29 NOTE — Telephone Encounter (Signed)
LMOM to call.

## 2016-10-03 DIAGNOSIS — E210 Primary hyperparathyroidism: Secondary | ICD-10-CM | POA: Diagnosis not present

## 2016-10-06 ENCOUNTER — Other Ambulatory Visit: Payer: Self-pay | Admitting: General Surgery

## 2016-10-06 DIAGNOSIS — E210 Primary hyperparathyroidism: Secondary | ICD-10-CM

## 2016-10-06 NOTE — Telephone Encounter (Signed)
Letter mailed for pt to call.  

## 2016-10-09 ENCOUNTER — Ambulatory Visit
Admission: RE | Admit: 2016-10-09 | Discharge: 2016-10-09 | Disposition: A | Discharge status: Home / Self Care | Payer: Federal, State, Local not specified - PPO | Source: Ambulatory Visit | Attending: General Surgery | Admitting: General Surgery

## 2016-10-09 DIAGNOSIS — E210 Primary hyperparathyroidism: Secondary | ICD-10-CM

## 2016-10-09 DIAGNOSIS — E213 Hyperparathyroidism, unspecified: Secondary | ICD-10-CM | POA: Diagnosis not present

## 2016-10-09 IMAGING — CT CT NECK SOFT TISSUE WO/W CM
3 of 10 series · 9 of 27 positions shown, 10 images · IV contrast (75CC ISOVUE 370)
Comparison: Nuclear medicine sestamibi parathyroid scan [DATE].

CLINICAL DATA: 61-year-old female with hyperparathyroidism.
Negative sestamibi parathyroid scan in [REDACTED].

Creatinine was obtained on site at [HOSPITAL] at [REDACTED].Results: Creatinine 1.1 mg/dL.
EXAM:
CT NECK WITH AND WITHOUT CONTRAST
TECHNIQUE: Multidetector CT imaging of the neck was performed without and with
intravenous contrast.
CONTRAST:  75 mL Isovue 370

[Series 4: arterial thin · axial · arterial · 0.52mm/px · z∈[-28,+55]mm · 2 of 401 slices shown, 3 images]
[im 134/401  soft-tissue]
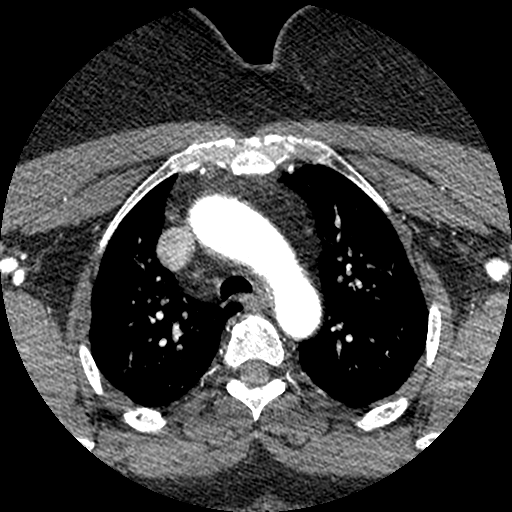
[im 134/401  bone]
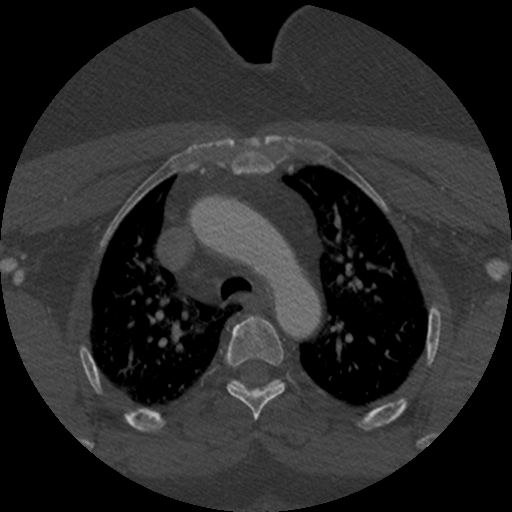
[im 267/401  bone]
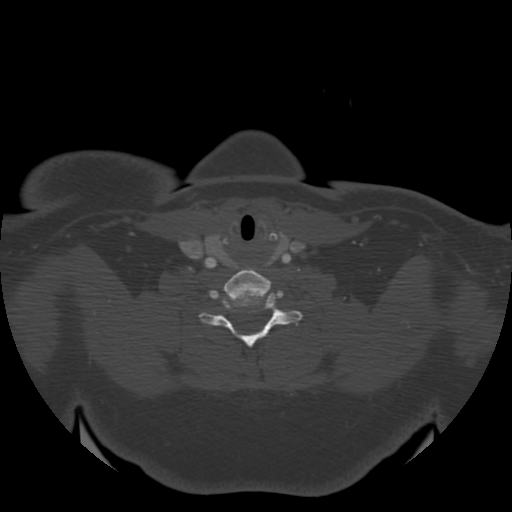

[Series 5: venous thin · axial · portal-venous · 0.52mm/px · z∈[-28,+55]mm · 2 of 401 slices shown]
[im 134/401  bone]
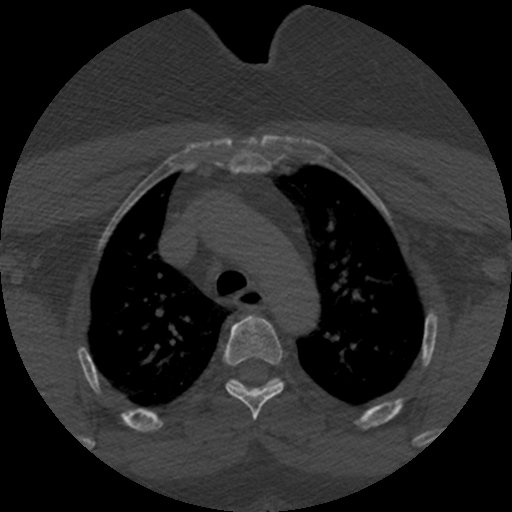
[im 267/401  bone]
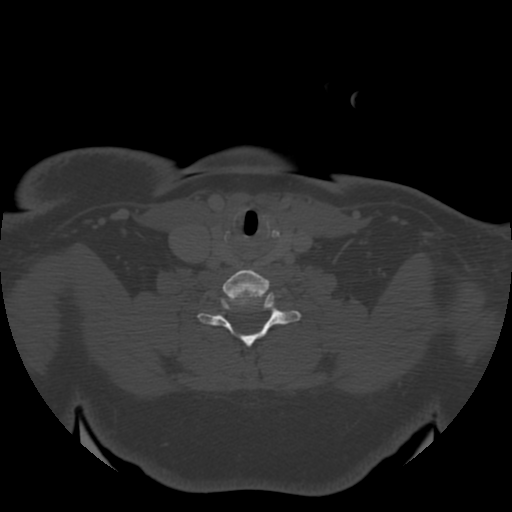

[Series 601: cor soft tissue neck · coronal · 0.52mm/px · 5 of 135 slices shown]
[im 23/135  bone]
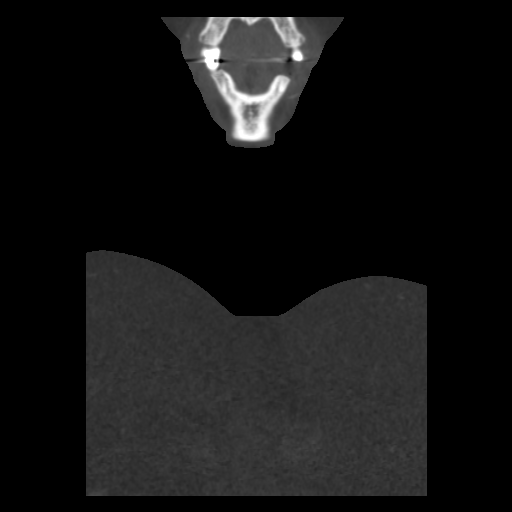
[im 45/135  bone]
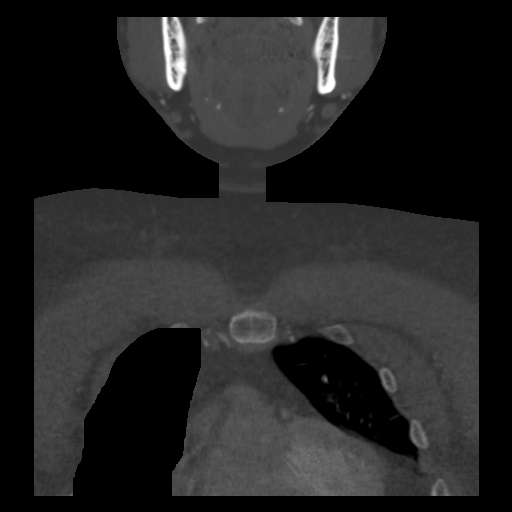
[im 68/135  bone]
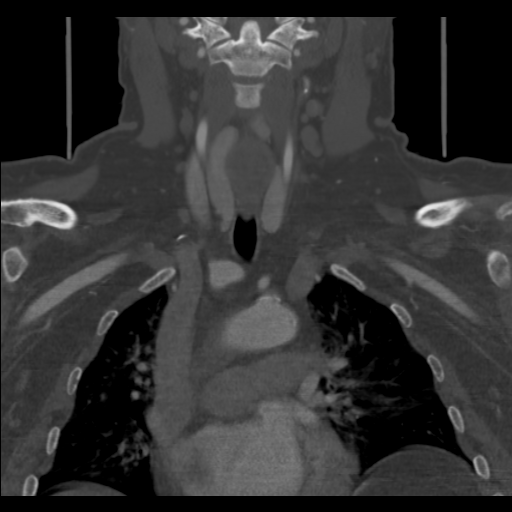
[im 90/135  bone]
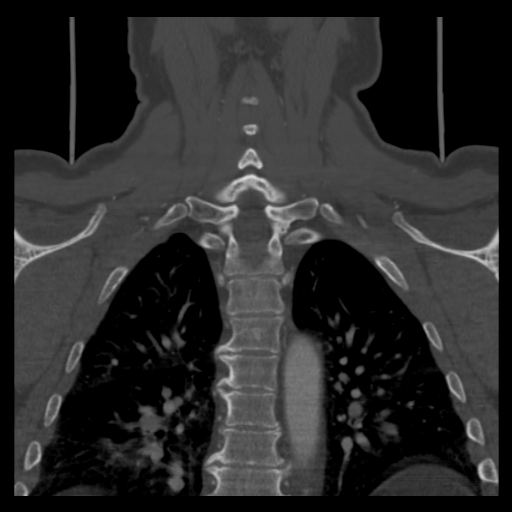
[im 112/135  bone]
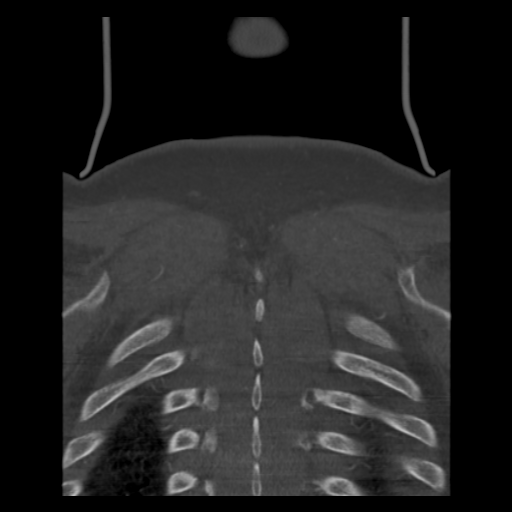

[9 of 27 positions shown; findings below may reference images not displayed]

FINDINGS: Pharynx and larynx: Laryngeal and pharyngeal soft tissue contours
appear normal on the post-contrast images. Negative parapharyngeal
spaces.

hyperenhancing soft tissue nodule located to the left of midline
which has inferior polar vessels. This is located at the level of
the hyoid bone, just cephalad to the superior extent of the right
thyroid lobe, and about a cm above the superior left thyroid lobe.
See series 3, image 11, series 6 image 23, series 601, image 67 and
series 602, image 70. In retrospect this is subtle but evident on
the sestamibi study. There is mild associated contour asymmetry of
the posterior wall of the pharynx here.

Salivary glands: Negative sublingual space, submandibular glands and
visible parotid glands.

Thyroid: Negative. See the left retropharyngeal space finding above.

Lymph nodes: Negative.  No lymphadenopathy.

Vascular: Major arterial structures from the aortic arch to the
visible skullbase are patent. There is mild to moderate left ICA
origin atherosclerosis. Major venous structures in the neck appear
patent.

Skeleton: Probable benign vertebral body hemangioma in the anterior
C7 vertebra. Heterogeneous hypodensity in the sternum and manubrium.
No destructive osseous lesion identified.

Upper chest: Negative visualized mediastinum aside from mild
lipomatosis. No superior mediastinal mass or lymphadenopathy.
Partially visible probable scarring in the lower lingula. Otherwise
negative lung parenchyma. No axillary lymphadenopathy.
IMPRESSION: 1. Positive for a 12 x 16 mm hyperenhancing soft tissue nodule in
the left retropharyngeal space with morphology of a parathyroid
adenoma. This is located at the level of the hyoid, about 1 cm above
the superior pole of the left thyroid lobe.
2. Heterogeneous demineralization in the sternum and manubrium might
be the sequelae of hyperparathyroidism. Probable benign hemangioma
in the anterior C7 vertebral body.

## 2016-10-09 MED ORDER — IOPAMIDOL (ISOVUE-370) INJECTION 76%
75.0000 mL | Freq: Once | INTRAVENOUS | Status: AC | PRN
  Administered 2016-10-09: 75 mL via INTRAVENOUS

## 2016-10-30 ENCOUNTER — Ambulatory Visit: Payer: Self-pay | Admitting: General Surgery

## 2016-10-30 DIAGNOSIS — E210 Primary hyperparathyroidism: Secondary | ICD-10-CM | POA: Diagnosis not present

## 2016-10-30 NOTE — H&P (Signed)
Pamela Stein 10/30/2016 10:28 AM Location: [facility] Patient #: [id number] DOB: December 30, 1955 Single / Language: Cleophus Molt / Race: Black or African American Female  History of Present Illness Odis Hollingshead MD; 10/30/2016 10:49 AM) The patient is a 61 year old female.   Note:She returns for follow-up of her primary hyperparathyroidism. Sestamibi scan was negative for enlarged parathyroid gland. Neck ultrasound was negative for an enlarged parathyroid gland. 4-dimensional CT scan demonstrated findings as below:  1. Positive for a 12 x 16 mm hyperenhancing soft tissue nodule in the left retropharyngeal space with morphology of a parathyroid adenoma. This is located at the level of the hyoid, about 1 cm above the superior pole of the left thyroid lobe.  Of note is that she likes to sing a lot and is aware that there is a chance of permanent hoarseness after the surgery. 24-hour urine calcium level was normal. She was sent for a vitamin D level but I do not have that back yet. Last vitamin D level was 1 year ago and it was 20 with reference range being 30-100.  Allergies (Tanisha A. Owens Shark, [place]; 10/30/2016 10:29 AM) ACE Inhibitors Allergies Reconciled  Medication History (Tanisha A. Owens Shark, RMA; 10/30/2016 10:29 AM) Pravastatin Sodium (80MG  Tablet, Oral) Active. Gabapentin (300MG  Capsule, Oral) Active. Spironolactone (50MG  Tablet, Oral) Active. AmLODIPine Besylate (Oral) Specific strength unknown - Active. MetFORMIN HCl (Oral) Specific strength unknown - Active. Aspirin (81MG  Tablet, Oral) Active. Multivitamin Adult (Oral) Active. Biotin (Oral) Specific strength unknown - Active. Medications Reconciled    Vitals (Tanisha A. Brown RMA; 10/30/2016 10:29 AM) 10/30/2016 10:28 AM Weight: 217.4 lb Height: 62in Body Surface Area: 1.98 m Body Mass Index: 39.76 kg/m  Temp.: 97.10F  Pulse: 102 (Regular)  P.OX: 99% (Room air) BP: 142/84 (Sitting,  Left Arm, Standard)      Physical Exam Odis Hollingshead MD; 10/30/2016 11:20 AM)  The physical exam findings are as follows: Note:GENERAL APPEARANCE: Obese female in NAD. Pleasant and cooperative.  EARS, NOSE, MOUTH THROAT: [place]/AT external ears: no lesions or deformities external nose: no lesions or deformities hearing: grossly normal lips: moist, no deformities EYES external: conjunctiva, lids, sclerae normal pupils: equal, round glasses: no  NECK: Supple, no obvious mass or thyroid mass/enlargement, no trachea deviation  CV ascultation: RRR, no murmur extremity edema: no  RESP/CHEST auscultation: breath sounds equal and clear respiratory effort: normal  MUSCULOSKELETAL station and gait: normal deformities: none instability: none  LYMPHATIC: No palpable cervical, supraclavicular adenopathy.  NEUROLOGIC speech: normal  PSYCHIATRIC alertness and orientation: normal mood/affect/behavior: normal judgement and insight: normal    Assessment & Plan Odis Hollingshead MD; 10/30/2016 11:20 AM)  PRIMARY HYPERPARATHYROIDISM (E21.0) Impression: CT demonstrated findings consistent with an enlarged parathyroid gland on the left side.  Plan: We'll send her for her vitamin D level. The blood was drawn but apparently the test was not run. However, everything points toward primary hyperparathyroidism from a left-sided parathyroid adenoma. I recommended neck exploration and parathyroidectomy. We also talked about medical therapy but this would only be temporary and she understands that. The procedure and risks of thyroid surgery have been discussed. Risks include but not limited to bleeding, infection, wound healing problems, presence of scar, anesthesia, injury to recurrent laryngeal or superior laryngeal nerve and permanent hoarseness, voice changes, dysphagia. She seems to understand and would like to proceed.  Jackolyn Confer, M.D.

## 2016-11-02 DIAGNOSIS — M65312 Trigger thumb, left thumb: Secondary | ICD-10-CM | POA: Diagnosis not present

## 2016-11-02 DIAGNOSIS — E118 Type 2 diabetes mellitus with unspecified complications: Secondary | ICD-10-CM | POA: Diagnosis not present

## 2016-11-02 DIAGNOSIS — M79645 Pain in left finger(s): Secondary | ICD-10-CM | POA: Diagnosis not present

## 2016-11-05 ENCOUNTER — Other Ambulatory Visit: Payer: Self-pay | Admitting: Family Medicine

## 2016-11-16 DIAGNOSIS — E119 Type 2 diabetes mellitus without complications: Secondary | ICD-10-CM | POA: Diagnosis not present

## 2016-11-16 DIAGNOSIS — I10 Essential (primary) hypertension: Secondary | ICD-10-CM | POA: Diagnosis not present

## 2016-11-16 DIAGNOSIS — E210 Primary hyperparathyroidism: Secondary | ICD-10-CM | POA: Diagnosis not present

## 2016-11-16 DIAGNOSIS — Z23 Encounter for immunization: Secondary | ICD-10-CM | POA: Diagnosis not present

## 2016-12-15 NOTE — Patient Instructions (Addendum)
ALESSANDRIA HENKEN  12/15/2016   Your procedure is scheduled on:  Tuesday, Nov. 13, 2018   Report to [facility] Main  Entrance    Take [place]  elevators to 3rd floor to  [place] at 5:30 AM.    Call this number if you have problems the morning of surgery [phone]    Remember: ONLY 1 PERSON MAY GO WITH YOU TO SHORT STAY TO GET  READY MORNING OF [place].   Do not eat food or drink liquids :After Midnight.    Take these medicines the morning of surgery with A SIP OF WATER: None   DO NOT TAKE ANY DIABETIC MEDICATIONS DAY OF YOUR SURGERY   Bring Asthma inhaler day of surgery   Bring CPAP mask and tubing only day of surgery                               You may not have any metal on your body including hair pins, jewelry, and body  piercings             Do not wear  make-up, lotions, powders or perfumes, deodorant             Do not wear nail polish.  Do not shave  48 hours prior to surgery.                Do not bring valuables to the hospital. [place].   Contacts, dentures or bridgework may not be worn into surgery.   Leave suitcase in the car. After surgery it may be brought to your room.              Please read over the following fact sheets you were given: _____________________________________________________________________    [facility] - Preparing for Surgery Before surgery, you can play an important role.  Because skin is not sterile, your skin needs to be as free of germs as possible.  You can reduce the number of germs on your skin by washing with CHG (chlorahexidine gluconate) soap before surgery.  CHG is an antiseptic cleaner which kills germs and bonds with the skin to continue killing germs even after washing. Please DO NOT use if you have an allergy to CHG or antibacterial soaps.  If your skin becomes reddened/irritated stop using the CHG and inform your nurse when you  arrive at Short Stay. Do not shave (including legs and underarms) for at least 48 hours prior to the first CHG shower.  You may shave your face/neck.  Please follow these instructions carefully:  1.  Shower with CHG Soap the night before surgery and the  morning of Surgery.  2.  If you choose to wash your hair, wash your hair first as usual with your  normal  shampoo.  3.  After you shampoo, rinse your hair and body thoroughly to remove the  shampoo.                             4.  Use CHG as you would any other liquid soap.  You can apply chg directly  to the skin and wash  Gently with a scrungie or clean washcloth.  5.  Apply the CHG Soap to your body ONLY FROM THE NECK DOWN.   Do not use on face/ open                           Wound or open sores. Avoid contact with eyes, ears mouth and genitals (private parts).                       Wash face,  Genitals (private parts) with your normal soap.             6.  Wash thoroughly, paying special attention to the area where your surgery  will be performed.  7.  Thoroughly rinse your body with warm water from the neck down.  8.  DO NOT shower/wash with your normal soap after using and rinsing off  the CHG Soap.                9.  Pat yourself dry with a clean towel.            10.  Wear clean pajamas.            11.  Place clean sheets on your bed the night of your first shower and do not  sleep with pets. Day of Surgery : Do not apply any lotions/deodorants the morning of surgery.  Please wear clean clothes to the hospital/surgery center.  FAILURE TO FOLLOW THESE INSTRUCTIONS MAY RESULT IN THE CANCELLATION OF YOUR SURGERY  PATIENT SIGNATURE_________________________________  NURSE SIGNATURE__________________________________  ________________________________________________________________________

## 2016-12-15 NOTE — Pre-Procedure Instructions (Signed)
The following are in epic: Imaging of parathyroid 8/20,28,31/18

## 2016-12-16 ENCOUNTER — Other Ambulatory Visit: Payer: Self-pay

## 2016-12-16 ENCOUNTER — Encounter (HOSPITAL_COMMUNITY)
Admission: RE | Admit: 2016-12-16 | Discharge: 2016-12-16 | Disposition: A | Discharge status: Home / Self Care | Payer: Federal, State, Local not specified - PPO | Source: Ambulatory Visit | Attending: General Surgery | Admitting: General Surgery

## 2016-12-16 ENCOUNTER — Encounter (HOSPITAL_COMMUNITY): Payer: Self-pay

## 2016-12-16 DIAGNOSIS — E210 Primary hyperparathyroidism: Secondary | ICD-10-CM | POA: Diagnosis not present

## 2016-12-16 DIAGNOSIS — I517 Cardiomegaly: Secondary | ICD-10-CM | POA: Insufficient documentation

## 2016-12-16 DIAGNOSIS — Z01810 Encounter for preprocedural cardiovascular examination: Secondary | ICD-10-CM | POA: Insufficient documentation

## 2016-12-16 DIAGNOSIS — I444 Left anterior fascicular block: Secondary | ICD-10-CM | POA: Insufficient documentation

## 2016-12-16 DIAGNOSIS — Z01818 Encounter for other preprocedural examination: Secondary | ICD-10-CM | POA: Diagnosis not present

## 2016-12-16 HISTORY — DX: Polyneuropathy, unspecified: G62.9

## 2016-12-16 HISTORY — DX: Adverse effect of unspecified anesthetic, initial encounter: T41.45XA

## 2016-12-16 HISTORY — DX: Bronchitis, not specified as acute or chronic: J40

## 2016-12-16 HISTORY — DX: Cardiac murmur, unspecified: R01.1

## 2016-12-16 LAB — CBC WITH DIFFERENTIAL/PLATELET
BASOS ABS: 0.1 10*3/uL (ref 0.0–0.1)
BASOS PCT: 1 %
EOS ABS: 0.2 10*3/uL (ref 0.0–0.7)
Eosinophils Relative: 2 %
HCT: 37.2 % (ref 36.0–46.0)
HEMOGLOBIN: 11.9 g/dL — AB (ref 12.0–15.0)
Lymphocytes Relative: 35 %
Lymphs Abs: 3.5 10*3/uL (ref 0.7–4.0)
MCH: 22.7 pg — AB (ref 26.0–34.0)
MCHC: 32 g/dL (ref 30.0–36.0)
MCV: 71 fL — ABNORMAL LOW (ref 78.0–100.0)
Monocytes Absolute: 0.9 10*3/uL (ref 0.1–1.0)
Monocytes Relative: 9 %
NEUTROS PCT: 54 %
Neutro Abs: 5.5 10*3/uL (ref 1.7–7.7)
Platelets: 259 10*3/uL (ref 150–400)
RBC: 5.24 MIL/uL — AB (ref 3.87–5.11)
RDW: 16.5 % — ABNORMAL HIGH (ref 11.5–15.5)
WBC: 10.1 10*3/uL (ref 4.0–10.5)

## 2016-12-16 LAB — COMPREHENSIVE METABOLIC PANEL
ALBUMIN: 3.9 g/dL (ref 3.5–5.0)
ALK PHOS: 86 U/L (ref 38–126)
ALT: 20 U/L (ref 14–54)
AST: 21 U/L (ref 15–41)
Anion gap: 9 (ref 5–15)
BUN: 17 mg/dL (ref 6–20)
CHLORIDE: 107 mmol/L (ref 101–111)
CO2: 26 mmol/L (ref 22–32)
CREATININE: 1.05 mg/dL — AB (ref 0.44–1.00)
Calcium: 10.4 mg/dL — ABNORMAL HIGH (ref 8.9–10.3)
GFR calc Af Amer: >60 mL/min (ref >60)
GFR calc non Af Amer: 55 mL/min — ABNORMAL LOW (ref >60)
GLUCOSE: 113 mg/dL — AB (ref 65–99)
Potassium: 4 mmol/L (ref 3.5–5.1)
SODIUM: 142 mmol/L (ref 135–145)
Total Bilirubin: 0.4 mg/dL (ref 0.3–1.2)
Total Protein: 7.4 g/dL (ref 6.5–8.1)

## 2016-12-16 LAB — HEMOGLOBIN A1C
Hgb A1c MFr Bld: 6.9 % — ABNORMAL HIGH (ref 4.8–5.6)
Mean Plasma Glucose: 151.33 mg/dL

## 2016-12-16 LAB — GLUCOSE, CAPILLARY: Glucose-Capillary: 127 mg/dL — ABNORMAL HIGH (ref 65–99)

## 2016-12-16 NOTE — Pre-Procedure Instructions (Signed)
CBC with diff/CMP/Hgb A 1 C results 12/16/16 faxed to Dr. Zella Richer via epic.

## 2016-12-16 NOTE — Pre-Procedure Instructions (Signed)
No changes on EKG 12/16/16 from EKG 10/11/13 both in epic.

## 2016-12-22 ENCOUNTER — Other Ambulatory Visit: Payer: Self-pay

## 2016-12-22 ENCOUNTER — Ambulatory Visit (HOSPITAL_COMMUNITY): Payer: Federal, State, Local not specified - PPO | Admitting: Certified Registered Nurse Anesthetist

## 2016-12-22 ENCOUNTER — Encounter (HOSPITAL_COMMUNITY)
Admission: RE | Disposition: A | Discharge status: Home / Self Care | Payer: Self-pay | Source: Ambulatory Visit | Attending: General Surgery

## 2016-12-22 ENCOUNTER — Encounter (HOSPITAL_COMMUNITY): Payer: Self-pay | Admitting: Emergency Medicine

## 2016-12-22 ENCOUNTER — Ambulatory Visit (HOSPITAL_COMMUNITY)
Admission: RE | Admit: 2016-12-22 | Discharge: 2016-12-23 | Disposition: A | Discharge status: Home / Self Care | Payer: Federal, State, Local not specified - PPO | Source: Ambulatory Visit | Attending: General Surgery | Admitting: General Surgery

## 2016-12-22 DIAGNOSIS — G4733 Obstructive sleep apnea (adult) (pediatric): Secondary | ICD-10-CM | POA: Diagnosis not present

## 2016-12-22 DIAGNOSIS — Z6841 Body Mass Index (BMI) 40.0 and over, adult: Secondary | ICD-10-CM | POA: Diagnosis not present

## 2016-12-22 DIAGNOSIS — Z7982 Long term (current) use of aspirin: Secondary | ICD-10-CM | POA: Insufficient documentation

## 2016-12-22 DIAGNOSIS — E119 Type 2 diabetes mellitus without complications: Secondary | ICD-10-CM | POA: Diagnosis not present

## 2016-12-22 DIAGNOSIS — E1140 Type 2 diabetes mellitus with diabetic neuropathy, unspecified: Secondary | ICD-10-CM | POA: Insufficient documentation

## 2016-12-22 DIAGNOSIS — M549 Dorsalgia, unspecified: Secondary | ICD-10-CM | POA: Insufficient documentation

## 2016-12-22 DIAGNOSIS — R011 Cardiac murmur, unspecified: Secondary | ICD-10-CM | POA: Insufficient documentation

## 2016-12-22 DIAGNOSIS — Z79899 Other long term (current) drug therapy: Secondary | ICD-10-CM | POA: Insufficient documentation

## 2016-12-22 DIAGNOSIS — D497 Neoplasm of unspecified behavior of endocrine glands and other parts of nervous system: Secondary | ICD-10-CM | POA: Diagnosis not present

## 2016-12-22 DIAGNOSIS — I10 Essential (primary) hypertension: Secondary | ICD-10-CM | POA: Diagnosis not present

## 2016-12-22 DIAGNOSIS — E785 Hyperlipidemia, unspecified: Secondary | ICD-10-CM | POA: Diagnosis not present

## 2016-12-22 DIAGNOSIS — D351 Benign neoplasm of parathyroid gland: Secondary | ICD-10-CM | POA: Insufficient documentation

## 2016-12-22 DIAGNOSIS — E210 Primary hyperparathyroidism: Secondary | ICD-10-CM | POA: Insufficient documentation

## 2016-12-22 DIAGNOSIS — Z7984 Long term (current) use of oral hypoglycemic drugs: Secondary | ICD-10-CM | POA: Insufficient documentation

## 2016-12-22 HISTORY — PX: PARATHYROIDECTOMY: SHX19

## 2016-12-22 LAB — GLUCOSE, CAPILLARY
Glucose-Capillary: 119 mg/dL — ABNORMAL HIGH (ref 65–99)
Glucose-Capillary: 142 mg/dL — ABNORMAL HIGH (ref 65–99)
Glucose-Capillary: 147 mg/dL — ABNORMAL HIGH (ref 65–99)
Glucose-Capillary: 186 mg/dL — ABNORMAL HIGH (ref 65–99)
Glucose-Capillary: 198 mg/dL — ABNORMAL HIGH (ref 65–99)

## 2016-12-22 SURGERY — PARATHYROIDECTOMY
Anesthesia: General | Site: Neck

## 2016-12-22 MED ORDER — MEPERIDINE HCL 50 MG/ML IJ SOLN: 6.2500 mg | INTRAMUSCULAR | Status: DC | PRN

## 2016-12-22 MED ORDER — LACTATED RINGERS IV SOLN
INTRAVENOUS | Status: DC | PRN
  Administered 2016-12-22: 1000 mL
  Administered 2016-12-22: 07:00:00 via INTRAVENOUS

## 2016-12-22 MED ORDER — MIDAZOLAM HCL 5 MG/5ML IJ SOLN
INTRAMUSCULAR | Status: DC | PRN
  Administered 2016-12-22: 2 mg via INTRAVENOUS

## 2016-12-22 MED ORDER — CHLORHEXIDINE GLUCONATE CLOTH 2 % EX PADS: 6.0000 | MEDICATED_PAD | Freq: Once | CUTANEOUS | Status: DC

## 2016-12-22 MED ORDER — DEXAMETHASONE SODIUM PHOSPHATE 10 MG/ML IJ SOLN
INTRAMUSCULAR | Status: DC | PRN
  Administered 2016-12-22: 5 mg via INTRAVENOUS

## 2016-12-22 MED ORDER — ALLOPURINOL 300 MG PO TABS
300.0000 mg | ORAL_TABLET | Freq: Every day | ORAL | Status: DC
  Administered 2016-12-22: 300 mg via ORAL
  Filled 2016-12-22: qty 1

## 2016-12-22 MED ORDER — HYDROMORPHONE HCL 1 MG/ML IJ SOLN
INTRAMUSCULAR | Status: AC
  Filled 2016-12-22: qty 1

## 2016-12-22 MED ORDER — HYDROCODONE-ACETAMINOPHEN 5-325 MG PO TABS: 1.0000 | ORAL_TABLET | ORAL | Status: DC | PRN

## 2016-12-22 MED ORDER — EPHEDRINE SULFATE 50 MG/ML IJ SOLN
INTRAMUSCULAR | Status: DC | PRN
  Administered 2016-12-22: 10 mg via INTRAVENOUS
  Administered 2016-12-22: 5 mg via INTRAVENOUS
  Administered 2016-12-22 (×2): 10 mg via INTRAVENOUS
  Administered 2016-12-22: 5 mg via INTRAVENOUS

## 2016-12-22 MED ORDER — HYDROMORPHONE HCL 1 MG/ML IJ SOLN
0.2500 mg | INTRAMUSCULAR | Status: DC | PRN
  Administered 2016-12-22 (×2): 0.5 mg via INTRAVENOUS

## 2016-12-22 MED ORDER — AMLODIPINE BESYLATE 10 MG PO TABS
10.0000 mg | ORAL_TABLET | Freq: Every day | ORAL | Status: DC
  Administered 2016-12-22: 10 mg via ORAL
  Filled 2016-12-22: qty 1

## 2016-12-22 MED ORDER — ONDANSETRON HCL 4 MG/2ML IJ SOLN
INTRAMUSCULAR | Status: AC
  Filled 2016-12-22: qty 2

## 2016-12-22 MED ORDER — LIDOCAINE HCL 4 % MT SOLN
OROMUCOSAL | Status: DC | PRN
  Administered 2016-12-22: 4 mL via TOPICAL

## 2016-12-22 MED ORDER — ONDANSETRON HCL 4 MG/2ML IJ SOLN: 4.0000 mg | INTRAMUSCULAR | Status: DC | PRN

## 2016-12-22 MED ORDER — PHENYLEPHRINE HCL 10 MG/ML IJ SOLN
INTRAMUSCULAR | Status: DC | PRN
  Administered 2016-12-22 (×4): 80 ug via INTRAVENOUS

## 2016-12-22 MED ORDER — MORPHINE SULFATE (PF) 2 MG/ML IV SOLN: 2.0000 mg | INTRAVENOUS | Status: DC | PRN

## 2016-12-22 MED ORDER — LIDOCAINE 2% (20 MG/ML) 5 ML SYRINGE
INTRAMUSCULAR | Status: AC
  Filled 2016-12-22: qty 5

## 2016-12-22 MED ORDER — SUGAMMADEX SODIUM 500 MG/5ML IV SOLN
INTRAVENOUS | Status: AC
  Filled 2016-12-22: qty 5

## 2016-12-22 MED ORDER — CEFAZOLIN SODIUM-DEXTROSE 2-4 GM/100ML-% IV SOLN
2.0000 g | INTRAVENOUS | Status: AC
  Administered 2016-12-22: 2 g via INTRAVENOUS

## 2016-12-22 MED ORDER — DEXAMETHASONE SODIUM PHOSPHATE 10 MG/ML IJ SOLN
INTRAMUSCULAR | Status: AC
  Filled 2016-12-22: qty 1

## 2016-12-22 MED ORDER — LIP MEDEX EX OINT
TOPICAL_OINTMENT | CUTANEOUS | Status: AC
  Filled 2016-12-22: qty 7

## 2016-12-22 MED ORDER — MIDAZOLAM HCL 2 MG/2ML IJ SOLN
INTRAMUSCULAR | Status: AC
  Filled 2016-12-22: qty 2

## 2016-12-22 MED ORDER — PROPOFOL 10 MG/ML IV BOLUS
INTRAVENOUS | Status: AC
  Filled 2016-12-22: qty 20

## 2016-12-22 MED ORDER — GABAPENTIN 300 MG PO CAPS
300.0000 mg | ORAL_CAPSULE | Freq: Every day | ORAL | Status: DC
  Administered 2016-12-22: 300 mg via ORAL
  Filled 2016-12-22: qty 1

## 2016-12-22 MED ORDER — PHENYLEPHRINE 40 MCG/ML (10ML) SYRINGE FOR IV PUSH (FOR BLOOD PRESSURE SUPPORT)
PREFILLED_SYRINGE | INTRAVENOUS | Status: AC
  Filled 2016-12-22: qty 10

## 2016-12-22 MED ORDER — SUCCINYLCHOLINE CHLORIDE 200 MG/10ML IV SOSY
PREFILLED_SYRINGE | INTRAVENOUS | Status: AC
  Filled 2016-12-22: qty 10

## 2016-12-22 MED ORDER — FENTANYL CITRATE (PF) 250 MCG/5ML IJ SOLN
INTRAMUSCULAR | Status: AC
  Filled 2016-12-22: qty 5

## 2016-12-22 MED ORDER — ROCURONIUM BROMIDE 50 MG/5ML IV SOSY
PREFILLED_SYRINGE | INTRAVENOUS | Status: DC | PRN
  Administered 2016-12-22: 50 mg via INTRAVENOUS

## 2016-12-22 MED ORDER — FENTANYL CITRATE (PF) 100 MCG/2ML IJ SOLN
INTRAMUSCULAR | Status: DC | PRN
  Administered 2016-12-22: 50 ug via INTRAVENOUS
  Administered 2016-12-22: 100 ug via INTRAVENOUS
  Administered 2016-12-22: 50 ug via INTRAVENOUS

## 2016-12-22 MED ORDER — EPHEDRINE 5 MG/ML INJ
INTRAVENOUS | Status: AC
  Filled 2016-12-22: qty 10

## 2016-12-22 MED ORDER — CEFAZOLIN SODIUM-DEXTROSE 2-4 GM/100ML-% IV SOLN
INTRAVENOUS | Status: AC
Start: 2016-12-22 — End: 2016-12-22
  Filled 2016-12-22: qty 100

## 2016-12-22 MED ORDER — INSULIN ASPART 100 UNIT/ML ~~LOC~~ SOLN
0.0000 [IU] | Freq: Three times a day (TID) | SUBCUTANEOUS | Status: DC
  Administered 2016-12-22: 2 [IU] via SUBCUTANEOUS
  Administered 2016-12-22: 3 [IU] via SUBCUTANEOUS

## 2016-12-22 MED ORDER — ALBUTEROL SULFATE (2.5 MG/3ML) 0.083% IN NEBU: 3.0000 mL | INHALATION_SOLUTION | Freq: Four times a day (QID) | RESPIRATORY_TRACT | Status: DC | PRN

## 2016-12-22 MED ORDER — 0.9 % SODIUM CHLORIDE (POUR BTL) OPTIME
TOPICAL | Status: DC | PRN
  Administered 2016-12-22: 1000 mL

## 2016-12-22 MED ORDER — ROCURONIUM BROMIDE 50 MG/5ML IV SOSY
PREFILLED_SYRINGE | INTRAVENOUS | Status: AC
  Filled 2016-12-22: qty 5

## 2016-12-22 MED ORDER — KCL IN DEXTROSE-NACL 20-5-0.45 MEQ/L-%-% IV SOLN
INTRAVENOUS | Status: DC
  Administered 2016-12-22: 11:00:00 via INTRAVENOUS
  Filled 2016-12-22 (×3): qty 1000

## 2016-12-22 MED ORDER — ONDANSETRON 4 MG PO TBDP: 4.0000 mg | ORAL_TABLET | Freq: Four times a day (QID) | ORAL | Status: DC | PRN

## 2016-12-22 MED ORDER — SUGAMMADEX SODIUM 200 MG/2ML IV SOLN
INTRAVENOUS | Status: DC | PRN
  Administered 2016-12-22: 250 mg via INTRAVENOUS

## 2016-12-22 MED ORDER — PROPOFOL 10 MG/ML IV BOLUS
INTRAVENOUS | Status: DC | PRN
  Administered 2016-12-22: 200 mg via INTRAVENOUS

## 2016-12-22 MED ORDER — ONDANSETRON HCL 4 MG/2ML IJ SOLN
INTRAMUSCULAR | Status: DC | PRN
  Administered 2016-12-22: 4 mg via INTRAVENOUS

## 2016-12-22 MED ORDER — ONDANSETRON HCL 4 MG/2ML IJ SOLN: 4.0000 mg | Freq: Once | INTRAMUSCULAR | Status: DC | PRN

## 2016-12-22 SURGICAL SUPPLY — 43 items
APL SKNCLS STERI-STRIP NONHPOA (GAUZE/BANDAGES/DRESSINGS) ×1
ATTRACTOMAT 16X20 MAGNETIC DRP (DRAPES) ×2 IMPLANT
BENZOIN TINCTURE PRP APPL 2/3 (GAUZE/BANDAGES/DRESSINGS) ×2 IMPLANT
BLADE HEX COATED 2.75 (ELECTRODE) IMPLANT
BLADE SURG 15 STRL LF DISP TIS (BLADE) ×1 IMPLANT
BLADE SURG 15 STRL SS (BLADE) ×1
CHLORAPREP W/TINT 26ML (MISCELLANEOUS) ×2 IMPLANT
CLIP VESOCCLUDE MED 6/CT (CLIP) ×4 IMPLANT
CLIP VESOCCLUDE SM WIDE 6/CT (CLIP) ×4 IMPLANT
DISSECTOR ROUND CHERRY 3/8 STR (MISCELLANEOUS) ×2 IMPLANT
DRAIN PENROSE 18X1/4 LTX STRL (WOUND CARE) IMPLANT
DRAPE LAPAROTOMY T 98X78 PEDS (DRAPES) ×2 IMPLANT
ELECT PENCIL ROCKER SW 15FT (MISCELLANEOUS) ×2 IMPLANT
ELECT REM PT RETURN 15FT ADLT (MISCELLANEOUS) ×2 IMPLANT
GAUZE SPONGE 4X4 12PLY STRL (GAUZE/BANDAGES/DRESSINGS) ×2 IMPLANT
GAUZE SPONGE 4X4 16PLY XRAY LF (GAUZE/BANDAGES/DRESSINGS) ×2 IMPLANT
GLOVE BIOGEL PI IND STRL 7.0 (GLOVE) ×1 IMPLANT
GLOVE BIOGEL PI INDICATOR 7.0 (GLOVE) ×1
GLOVE ECLIPSE 8.0 STRL XLNG CF (GLOVE) ×2 IMPLANT
GLOVE INDICATOR 8.0 STRL GRN (GLOVE) ×4 IMPLANT
GOWN STRL REUS W/TWL LRG LVL3 (GOWN DISPOSABLE) ×2 IMPLANT
GOWN STRL REUS W/TWL XL LVL3 (GOWN DISPOSABLE) ×6 IMPLANT
HEMOSTAT SURGICEL 2X14 (HEMOSTASIS) IMPLANT
HEMOSTAT SURGICEL 2X4 FIBR (HEMOSTASIS) ×2 IMPLANT
ILLUMINATOR WAVEGUIDE N/F (MISCELLANEOUS) ×2 IMPLANT
KIT BASIN OR (CUSTOM PROCEDURE TRAY) ×2 IMPLANT
NS IRRIG 1000ML POUR BTL (IV SOLUTION) ×2 IMPLANT
PACK BASIC VI WITH GOWN DISP (CUSTOM PROCEDURE TRAY) ×2 IMPLANT
SHEARS HARMONIC 9CM CVD (BLADE) ×2 IMPLANT
STAPLER VISISTAT 35W (STAPLE) IMPLANT
STRIP CLOSURE SKIN 1/2X4 (GAUZE/BANDAGES/DRESSINGS) ×2 IMPLANT
SUT MNCRL AB 4-0 PS2 18 (SUTURE) ×2 IMPLANT
SUT SILK 2 0 (SUTURE) ×2
SUT SILK 2-0 18XBRD TIE 12 (SUTURE) ×1 IMPLANT
SUT SILK 3 0 (SUTURE)
SUT SILK 3-0 18XBRD TIE 12 (SUTURE) IMPLANT
SUT VIC AB 3-0 SH 18 (SUTURE) ×6 IMPLANT
SUT VICRYL 3 0 BR 18  UND (SUTURE)
SUT VICRYL 3 0 BR 18 UND (SUTURE) IMPLANT
SYR BULB IRRIGATION 50ML (SYRINGE) ×2 IMPLANT
TOWEL OR 17X26 10 PK STRL BLUE (TOWEL DISPOSABLE) ×2 IMPLANT
TOWEL OR NON WOVEN STRL DISP B (DISPOSABLE) ×2 IMPLANT
YANKAUER SUCT BULB TIP 10FT TU (MISCELLANEOUS) ×2 IMPLANT

## 2016-12-22 NOTE — Op Note (Signed)
OPERATIVE REPORT - PARATHYROIDECTOMY  Preoperative diagnosis: Primary hyperparathyroidism  Postop diagnosis: Same  Procedure: Neck exploration and left superior parathyroidectomy  Surgeon:  Jackolyn Confer, M.D.  Asst:  Armandina Gemma, M.D.  Anesthesia: General  Estimated blood loss: Less than 50 ml  Indications: This is a 61 year old female with primary hyperparathyroidism. I don't enlarged parathyroid gland has been localized by 4-dimensional CT to be in the left superior area. She now presents for elective neck exploration and parathyroidectomy.  Procedure: She was seen in the holding area. She was brought to operating room and placed in a supine position on the operating room table. Following administration of general anesthesia, she was positioned and then prepped and draped in the usual strict aseptic fashion. A timeout was performed.  After ascertaining that an adequate level of anesthesia been achieved, a transverse lneck incision was made sharply, beginning at the midline and extending to the left.. Dissection was carried through subcutaneous tissues and platysma. Hemostasis was obtained with the electrocautery. Skin flaps were developed circumferentially and a Weitlander retractor was placed for exposure.  Left strap muscles were incised in the midline. Strap muscles were reflected exposing the left thyroid lobe. With gentle blunt dissection the superior aspect of the left thyroid lobe was mobilized.  Dissection was carried through tissue on the thyroid capsule and an enlarged parathyroid gland was identified posteriorly that was concordant with the findings on the CT scan. It was gently mobilized. Vascular structures to the gland were divided between small ligaclips. Care was taken to avoid the recurrent laryngeal nerve and the esophagus. The parathyroid gland was completely excised. It was submitted to pathology where frozen section confirmed parathyroid tissue consistent with  adenoma.  The neck was irrigated with warm saline and good hemostasis was noted. Fibrillar was placed in the operative field. Strap muscles were reapproximated in the midline with interrupted 3-0 Vicryl sutures. Platysma was closed with interrupted 3-0 Vicryl sutures. Skin was closed with a running 4-0 Monocryl subcuticular suture. The wound was washed and dried and benzoin and Steri-Strips were applied. Sterile gauze dressings were applied. She  patient tolerated the procedure well without any apparent complications and was taken to the PACU in satisfactory condition.  Jackolyn Confer, M.D.

## 2016-12-22 NOTE — Transfer of Care (Signed)
Immediate Anesthesia Transfer of Care Note  Patient: Pamela Stein  Procedure(s) Performed: PARATHYROIDECTOMY, NECK EXPLORATION (N/A Neck)  Patient Location: PACU  Anesthesia Type:General  Level of Consciousness: awake, alert  and oriented  Airway & Oxygen Therapy: Patient Spontanous Breathing and Patient connected to face mask oxygen  Post-op Assessment: Report given to RN and Post -op Vital signs reviewed and stable  Post vital signs: Reviewed and stable  Last Vitals:  Vitals:   12/22/16 0542 12/22/16 0900  BP: (!) 152/85 (!) 160/78  Pulse: 92 93  Resp: 18 19  Temp: 37.1 C (!) 36.4 C  SpO2: 98% 96%    Last Pain:  Vitals:   12/22/16 0605  TempSrc:   PainSc: 2       Patients Stated Pain Goal: 4 (41/03/01 3143)  Complications: No apparent anesthesia complications

## 2016-12-22 NOTE — H&P (Signed)
Pamela Stein is an 61 y.o. female.   Chief Complaint:  Here for elective surgery HPI: She has primary hyperparathyroidism. Sestamibi scan was negative for enlarged parathyroid gland. Neck ultrasound was negative for an enlarged parathyroid gland. 4-dimensional CT scan demonstrated findings as below:  1. Positive for a 12 x 16 mm hyperenhancing soft tissue nodule in the left retropharyngeal space with morphology of a parathyroid adenoma. This is located at the level of the hyoid, about 1 cm above the superior pole of the left thyroid lobe.  Of note is that she likes to sing a lot and is aware that there is a chance of permanent hoarseness after the surgery. 24-hour urine calcium level was normal.    Past Medical History:  Diagnosis Date  . Back pain   . Bronchitis   . Complication of anesthesia    stopped breathing after endometrial ablation procedure prior to her being diagn. with OSA  . Diabetes mellitus   . Heart murmur   . Hyperlipidemia   . Hypertension   . Neuropathy   . Obesity   . Obstructive sleep apnea     Past Surgical History:  Procedure Laterality Date  . ABDOMINAL HYSTERECTOMY    . ADENOIDECTOMY    . CATARACT EXTRACTION Left   . COLONOSCOPY    . DILATION AND CURETTAGE OF UTERUS    . ENDOMETRIAL ABLATION    . EYE SURGERY Left 12/04/2013   cataract  . TONSILLECTOMY      Family History  Problem Relation Age of Onset  . Cancer Father        throat  . Diabetes Maternal Grandmother   . Hypertension Maternal Grandfather   . Hypertension Paternal Grandmother   . Diabetes Paternal Grandfather    Social History:  reports that  has never smoked. she has never used smokeless tobacco. She reports that she does not drink alcohol or use drugs.  Allergies:  Allergies  Allergen Reactions  . Ace Inhibitors Cough    Medications Prior to Admission  Medication Sig Dispense Refill  . ACCU-CHEK AVIVA PLUS test strip USE AS DIRECTED TO TEST TWICE DAILY 100  each 0  . ACCU-CHEK SOFTCLIX LANCETS lancets USE TO DRAW BLOOD FOR GLUCOSE TEST 100 each 4  . allopurinol (ZYLOPRIM) 300 MG tablet TAKE 1 TABLET BY MOUTH DAILY 90 tablet 1  . amLODipine (NORVASC) 10 MG tablet TAKE 1 TABLET BY MOUTH DAILY 30 tablet 4  . aspirin 81 MG tablet Take 81 mg by mouth daily.      Marland Kitchen BIOTIN PO Take 1 tablet by mouth daily.    Marland Kitchen gabapentin (NEURONTIN) 300 MG capsule Take 1 capsule (300 mg total) by mouth 3 (three) times daily. (Patient taking differently: Take 300 mg by mouth at bedtime. ) 90 capsule 3  . metFORMIN (GLUCOPHAGE) 500 MG tablet TAKE 1 TABLET BY MOUTH TWICE DAILY WITH A MEAL (Patient taking differently: TAKE 1 TABLET BY MOUTH DAILY) 180 tablet 1  . pravastatin (PRAVACHOL) 80 MG tablet TAKE 1 TABLET(80 MG) BY MOUTH EVERY EVENING 90 tablet 0  . albuterol (PROVENTIL HFA;VENTOLIN HFA) 108 (90 Base) MCG/ACT inhaler Inhale 1-2 puffs into the lungs every 6 (six) hours as needed for wheezing or shortness of breath. 1 Inhaler 0  . ergocalciferol (VITAMIN D2) 50000 units capsule Take 1 capsule (50,000 Units total) by mouth once a week. One capsule once weekly (Patient not taking: Reported on 12/09/2016) 12 capsule 1  . escitalopram (LEXAPRO) 10 MG tablet Take 1  tablet (10 mg total) by mouth daily. (Patient not taking: Reported on 12/09/2016) 30 tablet 6  . predniSONE (STERAPRED UNI-PAK 21 TAB) 10 MG (21) TBPK tablet Take by mouth daily. Take 6 tabs by mouth daily  for 2 days, then 5 tabs for 2 days, then 4 tabs for 2 days, then 3 tabs for 2 days, 2 tabs for 2 days, then 1 tab by mouth daily for 2 days (Patient not taking: Reported on 12/09/2016) 42 tablet 0  . spironolactone (ALDACTONE) 50 MG tablet Take 1 tablet (50 mg total) by mouth daily. (Patient not taking: Reported on 12/09/2016) 30 tablet 4    Results for orders placed or performed during the hospital encounter of 12/22/16 (from the past 48 hour(s))  Glucose, capillary     Status: Abnormal   Collection Time:  12/22/16  5:39 AM  Result Value Ref Range   Glucose-Capillary 119 (H) 65 - 99 mg/dL   No results found.  Review of Systems  Constitutional: Negative for chills and fever.  Gastrointestinal: Negative for diarrhea, nausea and vomiting.    Blood pressure (!) 152/85, pulse 92, temperature 98.7 F (37.1 C), temperature source Oral, resp. rate 18, height 5' 2.5" (1.588 m), weight 102.5 kg (226 lb), SpO2 98 %. Physical Exam  Constitutional:  Obese female  HENT:  Head: Normocephalic and atraumatic.  Eyes: EOM are normal. No scleral icterus.  Cardiovascular: Normal rate and regular rhythm.  Respiratory: Effort normal and breath sounds normal.  GI: Soft. There is no tenderness.  Neurological: She is alert.  Skin: Skin is warm and dry.  Psychiatric: She has a normal mood and affect. Her behavior is normal.     Assessment/Plan Primary hyperparathyroidism- 4D CT suggests a left superior adenoma  Plan:  Neck exploration and parathyroidectomy.  Procedure and risks have been discussed with her previously.  Odis Hollingshead, MD 12/22/2016, 7:20 AM

## 2016-12-22 NOTE — Discharge Instructions (Addendum)
[facility], [place] [phone]  THYROID/ PARATHYROID SURGERY: POST OP INSTRUCTIONS  Always review your discharge instruction sheet given to you by the facility where your surgery was performed.  IF YOU HAVE DISABILITY OR FAMILY LEAVE FORMS, YOU MUST BRING THEM TO THE OFFICE FOR PROCESSING.  PLEASE DO NOT GIVE THEM TO YOUR DOCTOR.  1. A prescription for pain medication may be given to you upon discharge.  Take your pain medication as prescribed, if needed.  If narcotic pain medicine is not needed, then you may take acetaminophen (Tylenol) or ibuprofen (Advil) as needed. 2. Take your usually prescribed medications unless otherwise directed. 3. If you need a refill on your pain medication, please contact your pharmacy. They will contact our office to request authorization.  Prescriptions will not be filled after 5pm or on week-ends. 4. You should follow a light diet the first 24 hours after arrival home, such as soup and crackers, etc.  Be sure to include lots of fluids daily.  Resume your normal diet the day after surgery. 5. Most patients will experience some swelling and bruising on the chest and neck area.  Ice packs will help.  Swelling and bruising can take several days to resolve.  6. It is common to experience some constipation if taking pain medication after surgery.  Increasing fluid intake and taking a stool softener will usually help or prevent this problem from occurring.  A mild laxative (Milk of Magnesia or Miralax) should be taken according to package directions if there are no bowel movements after 48 hours. 7. Unless discharge instructions indicate otherwise, you may remove your bandages 48 hours after surgery, and you may shower at that time.  You may have steri-strips (small skin tapes) in place directly over the incision.  These strips should be left on the skin for 14 days.  If your surgeon used skin glue on the incision, you may shower in 24 hours.  The glue  will flake off over the next 2-3 weeks.  Any sutures or staples will be removed at the office during your follow-up visit. 8. ACTIVITIES:  You may resume regular (light) daily activities beginning the next day--such as daily self-care, walking, climbing stairs--gradually increasing activities as tolerated.  You may have sexual intercourse when it is comfortable.  Refrain from any heavy lifting or straining for one week. a. You may drive when you no longer are taking prescription pain medication, you can comfortably wear a seatbelt, and you can safely maneuver your car and apply brakes b. RETURN TO WORK:  __________________________________________________________ 9. You should see your doctor in the office for a follow-up appointment 01/06/17 at 9:15 am. Please arrive 15 minutes early.  10. OTHER INSTRUCTIONS: _Restart Aspirin 11/16/18__________________________________________________________________________ __Please have blood drawn on 01/04/17.  Office will call you with instructions for the blood draw._______________________________________________________________________________________________________________ _________________________________________________________________________________________________________________   WHEN TO CALL YOUR DOCTOR: 1. Fever over 101.0 2. Inability to urinate 3. Nausea and/or vomiting 4. Extreme swelling or bruising 5. Continued bleeding from incision. 6. Increased pain, redness, or drainage from the incision. 7. Difficulty swallowing or breathing 8. Muscle cramping or spasms. 9. Numbness or tingling in hands or feet or around lips.  The clinic staff is available to answer your questions during regular business hours.  Please dont hesitate to call and ask to speak to one of the nurses if you have concerns.  For further questions, please visit [email or web address]

## 2016-12-22 NOTE — Anesthesia Procedure Notes (Signed)
Procedure Name: Intubation Date/Time: 12/22/2016 7:40 AM Performed by: Maxwell Caul, CRNA Pre-anesthesia Checklist: Patient identified, Emergency Drugs available, Suction available and Patient being monitored Patient Re-evaluated:Patient Re-evaluated prior to induction Oxygen Delivery Method: Circle system utilized Preoxygenation: Pre-oxygenation with 100% oxygen Induction Type: IV induction Ventilation: Mask ventilation without difficulty and Oral airway inserted - appropriate to patient size Laryngoscope Size: Mac and 4 Grade View: Grade II Tube type: Oral Tube size: 7.5 mm Number of attempts: 1 Airway Equipment and Method: Stylet,  Oral airway and LTA kit utilized Placement Confirmation: ETT inserted through vocal cords under direct vision,  positive ETCO2 and breath sounds checked- equal and bilateral Secured at: 21 cm Tube secured with: Tape Dental Injury: Teeth and Oropharynx as per pre-operative assessment

## 2016-12-22 NOTE — Anesthesia Preprocedure Evaluation (Addendum)
Anesthesia Evaluation  Patient identified by MRN, date of birth, ID band Patient awake    Reviewed: Allergy & Precautions, NPO status , Patient's Chart, lab work & pertinent test results  Airway Mallampati: II  TM Distance: >3 FB Neck ROM: Full    Dental   Pulmonary sleep apnea ,    Pulmonary exam normal        Cardiovascular hypertension, Pt. on medications Normal cardiovascular exam     Neuro/Psych    GI/Hepatic   Endo/Other  diabetes, Type 2, Oral Hypoglycemic Agents  Renal/GU      Musculoskeletal   Abdominal   Peds  Hematology   Anesthesia Other Findings   Reproductive/Obstetrics                            Anesthesia Physical Anesthesia Plan  ASA: III  Anesthesia Plan: General   Post-op Pain Management:    Induction: Intravenous  PONV Risk Score and Plan: 3 and Midazolam, Dexamethasone, Ondansetron and Treatment may vary due to age or medical condition  Airway Management Planned: Oral ETT  Additional Equipment:   Intra-op Plan:   Post-operative Plan: Extubation in OR  Informed Consent: I have reviewed the patients History and Physical, chart, labs and discussed the procedure including the risks, benefits and alternatives for the proposed anesthesia with the patient or authorized representative who has indicated his/her understanding and acceptance.     Plan Discussed with: CRNA and Surgeon  Anesthesia Plan Comments:         Anesthesia Quick Evaluation

## 2016-12-22 NOTE — Interval H&P Note (Signed)
History and Physical Interval Note:  12/22/2016 7:26 AM  Pamela Stein  has presented today for surgery, with the diagnosis of primary hyperparathyroidism  The various methods of treatment have been discussed with the patient and family. After consideration of risks, benefits and other options for treatment, the patient has consented to  Procedure(s): PARATHYROIDECTOMY, NECK EXPLORATION (N/A) as a surgical intervention .  The patient's history has been reviewed, patient examined, no change in status, stable for surgery.  I have reviewed the patient's chart and labs.  Questions were answered to the patient's satisfaction.     Regenia Erck Lenna Sciara

## 2016-12-23 DIAGNOSIS — Z6841 Body Mass Index (BMI) 40.0 and over, adult: Secondary | ICD-10-CM | POA: Diagnosis not present

## 2016-12-23 DIAGNOSIS — E785 Hyperlipidemia, unspecified: Secondary | ICD-10-CM | POA: Diagnosis not present

## 2016-12-23 DIAGNOSIS — D351 Benign neoplasm of parathyroid gland: Secondary | ICD-10-CM | POA: Diagnosis not present

## 2016-12-23 DIAGNOSIS — Z79899 Other long term (current) drug therapy: Secondary | ICD-10-CM | POA: Diagnosis not present

## 2016-12-23 DIAGNOSIS — Z7984 Long term (current) use of oral hypoglycemic drugs: Secondary | ICD-10-CM | POA: Diagnosis not present

## 2016-12-23 DIAGNOSIS — E1140 Type 2 diabetes mellitus with diabetic neuropathy, unspecified: Secondary | ICD-10-CM | POA: Diagnosis not present

## 2016-12-23 DIAGNOSIS — E210 Primary hyperparathyroidism: Secondary | ICD-10-CM | POA: Diagnosis not present

## 2016-12-23 DIAGNOSIS — R011 Cardiac murmur, unspecified: Secondary | ICD-10-CM | POA: Diagnosis not present

## 2016-12-23 DIAGNOSIS — G4733 Obstructive sleep apnea (adult) (pediatric): Secondary | ICD-10-CM | POA: Diagnosis not present

## 2016-12-23 DIAGNOSIS — I10 Essential (primary) hypertension: Secondary | ICD-10-CM | POA: Diagnosis not present

## 2016-12-23 DIAGNOSIS — Z7982 Long term (current) use of aspirin: Secondary | ICD-10-CM | POA: Diagnosis not present

## 2016-12-23 DIAGNOSIS — M549 Dorsalgia, unspecified: Secondary | ICD-10-CM | POA: Diagnosis not present

## 2016-12-23 LAB — BASIC METABOLIC PANEL
ANION GAP: 5 (ref 5–15)
BUN: 16 mg/dL (ref 6–20)
CHLORIDE: 107 mmol/L (ref 101–111)
CO2: 26 mmol/L (ref 22–32)
Calcium: 10 mg/dL (ref 8.9–10.3)
Creatinine, Ser: 1 mg/dL (ref 0.44–1.00)
GFR calc non Af Amer: 59 mL/min — ABNORMAL LOW (ref >60)
GLUCOSE: 135 mg/dL — AB (ref 65–99)
POTASSIUM: 4.3 mmol/L (ref 3.5–5.1)
Sodium: 138 mmol/L (ref 135–145)

## 2016-12-23 LAB — GLUCOSE, CAPILLARY: Glucose-Capillary: 122 mg/dL — ABNORMAL HIGH (ref 65–99)

## 2016-12-23 MED ORDER — HYDROCODONE-ACETAMINOPHEN 5-325 MG PO TABS: 1.0000 | ORAL_TABLET | Freq: Four times a day (QID) | ORAL | 0 refills | Status: DC | PRN

## 2016-12-23 NOTE — Progress Notes (Signed)
Assessment Active Problems:   Hyperparathyroidism, primary ([place]) s/p neck exploration and left superior parathyroidectomy 12/22/16-doing well; Calcium done from 10.4 to 10.   Plan:  Discharge to home.  Instructions given to her.   LOS: 0 days     1 Day Post-Op  Chief Complaint/Subjective: Throat a little sore.  Has a headache.  Voice good. We discussed findings at operation.  Objective: Vital signs in last 24 hours: Temp:  [97.5 F (36.4 C)-99 F (37.2 C)] 98.2 F (36.8 C) (11/14 0530) Pulse Rate:  [69-96] 69 (11/14 0530) Resp:  [13-19] 18 (11/14 0530) BP: (113-166)/(51-85) 147/82 (11/14 0530) SpO2:  [91 %-100 %] 96 % (11/14 0530) Last BM Date: 12/21/16  Intake/Output from previous day: 11/13 0701 - 11/14 0700 In: 2818.3 [P.O.:840; I.V.:1878.3; IV Piggyback:100] Out: 560 [Urine:550; Blood:10] Intake/Output this shift: No intake/output data recorded.  PE: General- In NAD.  Awake and alert. Voice good. Neck incision is clean and intact with minimal swelling.  Lab Results:  No results for input(s): WBC, HGB, HCT, PLT in the last 72 hours. BMET Recent Labs    12/23/16 0542  NA 138  K 4.3  CL 107  CO2 26  GLUCOSE 135*  BUN 16  CREATININE 1.00  CALCIUM 10.0   PT/INR No results for input(s): LABPROT, INR in the last 72 hours. Comprehensive Metabolic Panel:    Component Value Date/Time   NA 138 12/23/2016 0542   NA 142 12/16/2016 0942   K 4.3 12/23/2016 0542   K 4.0 12/16/2016 0942   CL 107 12/23/2016 0542   CL 107 12/16/2016 0942   CO2 26 12/23/2016 0542   CO2 26 12/16/2016 0942   BUN 16 12/23/2016 0542   BUN 17 12/16/2016 0942   CREATININE 1.00 12/23/2016 0542   CREATININE 1.05 (H) 12/16/2016 0942   CREATININE 1.15 (H) 03/09/2016 1415   CREATININE 1.03 (H) 10/29/2015 1147   GLUCOSE 135 (H) 12/23/2016 0542   GLUCOSE 113 (H) 12/16/2016 0942   CALCIUM 10.0 12/23/2016 0542   CALCIUM 10.4 (H) 12/16/2016 0942   AST 21 12/16/2016 0942   AST 14 10/29/2015  1147   ALT 20 12/16/2016 0942   ALT 13 10/29/2015 1147   ALKPHOS 86 12/16/2016 0942   ALKPHOS 89 10/29/2015 1147   BILITOT 0.4 12/16/2016 0942   BILITOT 0.3 10/29/2015 1147   PROT 7.4 12/16/2016 0942   PROT 7.4 10/29/2015 1147   ALBUMIN 3.9 12/16/2016 0942   ALBUMIN 4.0 10/29/2015 1147     Studies/Results: No results found.  Anti-infectives: Anti-infectives (From admission, onward)   Start     Dose/Rate Route Frequency Ordered Stop   12/22/16 0658  ceFAZolin (ANCEF) 2-4 GM/100ML-% IVPB    Comments:  Virgia [place]   : cabinet override      12/22/16 0658 12/22/16 0745   12/22/16 0550  ceFAZolin (ANCEF) IVPB 2g/100 mL premix     2 g 200 mL/hr over 30 Minutes Intravenous On call to O.R. 12/22/16 0550 12/22/16 0805       Gordon Vandunk J 12/23/2016

## 2016-12-23 NOTE — Discharge Summary (Signed)
Physician Discharge Summary  Patient ID: Pamela Stein MRN: [id number] DOB/AGE: 63-Dec-1957 63 y.o.  Admit date: 12/22/2016 Discharge date: 12/23/2016  Admission Diagnoses:  Primary hyperparathyroidism  Discharge Diagnoses:  Active Problems:   Hyperparathyroidism, primary ([place])  DM Type II  OSA  Morbid Obesity  HTN  Discharged Condition: good  Hospital Course: She underwent parathyroidectomy 12/22/16 and tolerated it well.  Calcium decreased to 10 from 10.4 on postop day 1.  She was able to be discharged on postop day 1.  Instructions were given to her.   Treatments: Neck exploration, left superior parathyroidectomy 12/22/16  Discharge Exam: Blood pressure (!) 147/82, pulse 69, temperature 98.2 F (36.8 C), temperature source Oral, resp. rate 18, height 5' 2.5" (1.588 m), weight 102.5 kg (226 lb), SpO2 96 %.   Disposition: 01-Home or Self Care   Allergies as of 12/23/2016      Reactions   Ace Inhibitors Cough      Medication List    STOP taking these medications   aspirin 81 MG tablet   predniSONE 10 MG (21) Tbpk tablet Commonly known as:  STERAPRED UNI-PAK 21 TAB   spironolactone 50 MG tablet Commonly known as:  ALDACTONE     TAKE these medications   ACCU-CHEK AVIVA PLUS test strip Generic drug:  glucose blood USE AS DIRECTED TO TEST TWICE DAILY   ACCU-CHEK SOFTCLIX LANCETS lancets USE TO DRAW BLOOD FOR GLUCOSE TEST   albuterol 108 (90 Base) MCG/ACT inhaler Commonly known as:  PROVENTIL HFA;VENTOLIN HFA Inhale 1-2 puffs into the lungs every 6 (six) hours as needed for wheezing or shortness of breath.   allopurinol 300 MG tablet Commonly known as:  ZYLOPRIM TAKE 1 TABLET BY MOUTH DAILY   amLODipine 10 MG tablet Commonly known as:  NORVASC TAKE 1 TABLET BY MOUTH DAILY   BIOTIN PO Take 1 tablet by mouth daily.   ergocalciferol 50000 units capsule Commonly known as:  VITAMIN D2 Take 1 capsule (50,000 Units total) by mouth once a week. One  capsule once weekly   escitalopram 10 MG tablet Commonly known as:  LEXAPRO Take 1 tablet (10 mg total) by mouth daily.   gabapentin 300 MG capsule Commonly known as:  NEURONTIN Take 1 capsule (300 mg total) by mouth 3 (three) times daily. What changed:  when to take this   HYDROcodone-acetaminophen 5-325 MG tablet Commonly known as:  NORCO Take 1 tablet every 6 (six) hours as needed by mouth.   metFORMIN 500 MG tablet Commonly known as:  GLUCOPHAGE TAKE 1 TABLET BY MOUTH TWICE DAILY WITH A MEAL What changed:  See the new instructions.   pravastatin 80 MG tablet Commonly known as:  PRAVACHOL TAKE 1 TABLET(80 MG) BY MOUTH EVERY EVENING        Signed: Lilyanah Celestin J 12/23/2016, 7:31 AM

## 2016-12-23 NOTE — Anesthesia Postprocedure Evaluation (Signed)
Anesthesia Post Note  Patient: Pamela Stein  Procedure(s) Performed: PARATHYROIDECTOMY, NECK EXPLORATION (N/A Neck)     Patient location during evaluation: PACU Anesthesia Type: General Level of consciousness: awake and alert Pain management: pain level controlled Vital Signs Assessment: post-procedure vital signs reviewed and stable Respiratory status: spontaneous breathing, nonlabored ventilation, respiratory function stable and patient connected to nasal cannula oxygen Cardiovascular status: blood pressure returned to baseline and stable Postop Assessment: no apparent nausea or vomiting Anesthetic complications: no    Last Vitals:  Vitals:   12/23/16 0530 12/23/16 0928  BP: (!) 147/82 (!) 143/73  Pulse: 69 85  Resp: 18 17  Temp: 36.8 C 36.6 C  SpO2: 96% 96%    Last Pain:  Vitals:   12/23/16 0928  TempSrc: Oral  PainSc:                  [name] DAVID

## 2017-01-04 DIAGNOSIS — E210 Primary hyperparathyroidism: Secondary | ICD-10-CM | POA: Diagnosis not present

## 2017-02-11 ENCOUNTER — Other Ambulatory Visit: Payer: Self-pay | Admitting: Family Medicine

## 2017-02-17 DIAGNOSIS — I10 Essential (primary) hypertension: Secondary | ICD-10-CM | POA: Diagnosis not present

## 2017-02-17 DIAGNOSIS — E119 Type 2 diabetes mellitus without complications: Secondary | ICD-10-CM | POA: Diagnosis not present

## 2017-02-17 DIAGNOSIS — E210 Primary hyperparathyroidism: Secondary | ICD-10-CM | POA: Diagnosis not present

## 2017-02-18 DIAGNOSIS — I10 Essential (primary) hypertension: Secondary | ICD-10-CM | POA: Diagnosis not present

## 2017-02-18 DIAGNOSIS — J42 Unspecified chronic bronchitis: Secondary | ICD-10-CM | POA: Diagnosis not present

## 2017-02-18 DIAGNOSIS — E119 Type 2 diabetes mellitus without complications: Secondary | ICD-10-CM | POA: Diagnosis not present

## 2017-03-02 DIAGNOSIS — E210 Primary hyperparathyroidism: Secondary | ICD-10-CM | POA: Diagnosis not present

## 2017-03-18 DIAGNOSIS — J111 Influenza due to unidentified influenza virus with other respiratory manifestations: Secondary | ICD-10-CM | POA: Diagnosis not present

## 2017-03-18 DIAGNOSIS — R52 Pain, unspecified: Secondary | ICD-10-CM | POA: Diagnosis not present

## 2017-03-18 DIAGNOSIS — E7800 Pure hypercholesterolemia, unspecified: Secondary | ICD-10-CM | POA: Diagnosis not present

## 2017-03-18 DIAGNOSIS — E119 Type 2 diabetes mellitus without complications: Secondary | ICD-10-CM | POA: Diagnosis not present

## 2017-04-24 DIAGNOSIS — R05 Cough: Secondary | ICD-10-CM | POA: Diagnosis not present

## 2017-04-24 DIAGNOSIS — J988 Other specified respiratory disorders: Secondary | ICD-10-CM | POA: Diagnosis not present

## 2017-04-24 DIAGNOSIS — Z0000 Encounter for general adult medical examination without abnormal findings: Secondary | ICD-10-CM | POA: Diagnosis not present

## 2017-04-24 DIAGNOSIS — E119 Type 2 diabetes mellitus without complications: Secondary | ICD-10-CM | POA: Diagnosis not present

## 2017-04-24 DIAGNOSIS — R509 Fever, unspecified: Secondary | ICD-10-CM | POA: Diagnosis not present

## 2017-05-08 DIAGNOSIS — R7309 Other abnormal glucose: Secondary | ICD-10-CM | POA: Diagnosis not present

## 2017-05-08 DIAGNOSIS — J988 Other specified respiratory disorders: Secondary | ICD-10-CM | POA: Diagnosis not present

## 2017-05-08 DIAGNOSIS — R05 Cough: Secondary | ICD-10-CM | POA: Diagnosis not present

## 2017-05-24 DIAGNOSIS — E119 Type 2 diabetes mellitus without complications: Secondary | ICD-10-CM | POA: Diagnosis not present

## 2017-05-24 DIAGNOSIS — Z0001 Encounter for general adult medical examination with abnormal findings: Secondary | ICD-10-CM | POA: Diagnosis not present

## 2017-05-24 DIAGNOSIS — I10 Essential (primary) hypertension: Secondary | ICD-10-CM | POA: Diagnosis not present

## 2017-05-25 DIAGNOSIS — J42 Unspecified chronic bronchitis: Secondary | ICD-10-CM | POA: Diagnosis not present

## 2017-05-25 DIAGNOSIS — E119 Type 2 diabetes mellitus without complications: Secondary | ICD-10-CM | POA: Diagnosis not present

## 2017-05-25 DIAGNOSIS — I10 Essential (primary) hypertension: Secondary | ICD-10-CM | POA: Diagnosis not present

## 2017-05-26 ENCOUNTER — Encounter: Payer: Self-pay | Admitting: Internal Medicine

## 2017-08-25 ENCOUNTER — Encounter: Payer: Self-pay | Admitting: Gastroenterology

## 2017-08-25 ENCOUNTER — Ambulatory Visit: Payer: Federal, State, Local not specified - PPO | Admitting: Gastroenterology

## 2017-10-27 DIAGNOSIS — E119 Type 2 diabetes mellitus without complications: Secondary | ICD-10-CM | POA: Diagnosis not present

## 2017-11-18 DIAGNOSIS — I10 Essential (primary) hypertension: Secondary | ICD-10-CM | POA: Diagnosis not present

## 2017-11-18 DIAGNOSIS — H6983 Other specified disorders of Eustachian tube, bilateral: Secondary | ICD-10-CM | POA: Diagnosis not present

## 2017-11-18 DIAGNOSIS — E119 Type 2 diabetes mellitus without complications: Secondary | ICD-10-CM | POA: Diagnosis not present

## 2017-11-18 DIAGNOSIS — H6690 Otitis media, unspecified, unspecified ear: Secondary | ICD-10-CM | POA: Diagnosis not present

## 2017-12-13 ENCOUNTER — Other Ambulatory Visit: Payer: Self-pay

## 2017-12-13 ENCOUNTER — Encounter: Payer: Self-pay | Admitting: Gastroenterology

## 2017-12-13 ENCOUNTER — Ambulatory Visit: Payer: Federal, State, Local not specified - PPO | Admitting: Gastroenterology

## 2017-12-13 DIAGNOSIS — Z1211 Encounter for screening for malignant neoplasm of colon: Secondary | ICD-10-CM | POA: Insufficient documentation

## 2017-12-13 MED ORDER — PEG 3350-KCL-NA BICARB-NACL 420 G PO SOLR: 4000.0000 mL | ORAL | 0 refills | Status: DC

## 2017-12-13 NOTE — Progress Notes (Signed)
Primary Care Physician:  Rosita Fire, MD Primary Gastroenterologist:  Dr. Gala Romney   Chief Complaint  Patient presents with  . Colonoscopy    last tcs 2008    HPI:   Pamela Stein is a 62 y.o. female presenting today at the request of Dr. Legrand Rams for routine screening colonoscopy. Last in 2008 with diminutive rectal polyp (polypoid mucosa on path).   No abdominal pain, N/V. No changes in bowel habits. No rectal bleeding. No upper GI symptoms. No dysphagia. No unexplained weight loss or lack of appetite.    Past Medical History:  Diagnosis Date  . Back pain   . Bronchitis   . Complication of anesthesia    stopped breathing after endometrial ablation procedure prior to her being diagn. with OSA  . Diabetes mellitus   . Heart murmur   . Hyperlipidemia   . Hypertension   . Neuropathy   . Obesity   . Obstructive sleep apnea    CPAP    Past Surgical History:  Procedure Laterality Date  . ABDOMINAL HYSTERECTOMY    . ADENOIDECTOMY    . CATARACT EXTRACTION Left   . COLONOSCOPY  2008   diminutive rectal polyp, s/p removal. polypoid mucosa  . DILATION AND CURETTAGE OF UTERUS    . ENDOMETRIAL ABLATION    . EYE SURGERY Left 12/04/2013   cataract  . PARATHYROIDECTOMY N/A 12/22/2016   Procedure: PARATHYROIDECTOMY, NECK EXPLORATION;  Surgeon: Jackolyn Confer, MD;  Location: [facility];  Service: General;  Laterality: N/A;  . TONSILLECTOMY      Current Outpatient Medications  Medication Sig Dispense Refill  . ACCU-CHEK AVIVA PLUS test strip USE AS DIRECTED TO TEST TWICE DAILY 100 each 0  . ACCU-CHEK SOFTCLIX LANCETS lancets USE TO DRAW BLOOD FOR GLUCOSE TEST 100 each 4  . albuterol (PROVENTIL HFA;VENTOLIN HFA) 108 (90 Base) MCG/ACT inhaler Inhale 1-2 puffs into the lungs every 6 (six) hours as needed for wheezing or shortness of breath. 1 Inhaler 0  . allopurinol (ZYLOPRIM) 300 MG tablet TAKE 1 TABLET BY MOUTH DAILY 90 tablet 0  . amLODipine (NORVASC) 10 MG tablet TAKE 1  TABLET BY MOUTH DAILY 30 tablet 4  . aspirin EC 81 MG tablet Take 81 mg by mouth daily.    Marland Kitchen BIOTIN PO Take 1 tablet by mouth daily.    Marland Kitchen escitalopram (LEXAPRO) 10 MG tablet Take 1 tablet (10 mg total) by mouth daily. 30 tablet 6  . furosemide (LASIX) 20 MG tablet Take 1 tablet by mouth daily.  3  . gabapentin (NEURONTIN) 300 MG capsule Take 1 capsule (300 mg total) by mouth 3 (three) times daily. (Patient taking differently: Take 300 mg by mouth at bedtime. ) 90 capsule 3  . losartan (COZAAR) 25 MG tablet Take 1 tablet by mouth daily.  3  . metFORMIN (GLUCOPHAGE) 500 MG tablet TAKE 1 TABLET BY MOUTH TWICE DAILY WITH A MEAL (Patient taking differently: Take 500 mg by mouth daily. ) 180 tablet 1  . pravastatin (PRAVACHOL) 80 MG tablet TAKE 1 TABLET(80 MG) BY MOUTH EVERY EVENING 90 tablet 0  . spironolactone (ALDACTONE) 25 MG tablet Take 1 tablet by mouth daily.  3   No current facility-administered medications for this visit.     Allergies as of 12/13/2017 - Review Complete 12/13/2017  Allergen Reaction Noted  . Ace inhibitors Cough 10/27/2011    Family History  Problem Relation Age of Onset  . Cancer Father  throat  . Throat cancer Father   . Diabetes Maternal Grandmother   . Hypertension Maternal Grandfather   . Hypertension Paternal Grandmother   . Diabetes Paternal Grandfather   . Colon cancer Neg Hx   . Colon polyps Neg Hx     Social History   Socioeconomic History  . Marital status: Single    Spouse name: Not on file  . Number of children: Not on file  . Years of education: Not on file  . Highest education level: Not on file  Occupational History  . Occupation: Primary school teacher  Social Needs  . Financial resource strain: Not on file  . Food insecurity:    Worry: Not on file    Inability: Not on file  . Transportation needs:    Medical: Not on file    Non-medical: Not on file  Tobacco Use  . Smoking status: Never Smoker  . Smokeless tobacco: Never Used    Substance and Sexual Activity  . Alcohol use: No    Alcohol/week: 0.0 standard drinks  . Drug use: No  . Sexual activity: Not Currently    Birth control/protection: Surgical  Lifestyle  . Physical activity:    Days per week: Not on file    Minutes per session: Not on file  . Stress: Not on file  Relationships  . Social connections:    Talks on phone: Not on file    Gets together: Not on file    Attends religious service: Not on file    Active member of club or organization: Not on file    Attends meetings of clubs or organizations: Not on file    Relationship status: Not on file  . Intimate partner violence:    Fear of current or ex partner: Not on file    Emotionally abused: Not on file    Physically abused: Not on file    Forced sexual activity: Not on file  Other Topics Concern  . Not on file  Social History Narrative  . Not on file    Review of Systems: Gen: Denies any fever, chills, fatigue, weight loss, lack of appetite.  CV: Denies chest pain, heart palpitations, peripheral edema, syncope.  Resp: Denies shortness of breath at rest or with exertion. Denies wheezing or cough.  GI: see HPI  GU : Denies urinary burning, urinary frequency, urinary hesitancy MS: Denies joint pain, muscle weakness, cramps, or limitation of movement.  Derm: Denies rash, itching, dry skin Psych: Denies depression, anxiety, memory loss, and confusion Heme: Denies bruising, bleeding, and enlarged lymph nodes.  Physical Exam: BP 133/74   Pulse 98   Temp 98.8 F (37.1 C) (Oral)   Ht 5' 2.5" (1.588 m)   Wt 223 lb 3.2 oz (101.2 kg)   BMI 40.17 kg/m  General:   Alert and oriented. Pleasant and cooperative. Well-nourished and well-developed.  Head:  Normocephalic and atraumatic. Eyes:  Without icterus, sclera clear and conjunctiva pink.  Ears:  Normal auditory acuity. Nose:  No deformity, discharge,  or lesions. Mouth:  No deformity or lesions, oral mucosa pink.  Lungs:  Clear to  auscultation bilaterally. No wheezes, rales, or rhonchi. No distress.  Heart:  S1, S2 present with systolic murmur  Abdomen:  +BS, soft, non-tender and non-distended. No HSM noted. No guarding or rebound. No masses appreciated.  Rectal:  Deferred  Msk:  Symmetrical without gross deformities. Normal posture. Extremities:  Without edema. Neurologic:  Alert and  oriented x4 Psych:  Alert and  cooperative. Normal mood and affect.

## 2017-12-13 NOTE — Assessment & Plan Note (Signed)
62 year old female due for routine screening, last colonoscopy in 2008. No family history of colorectal cancer or polyps. No concerning upper or lower GI signs/symptoms.  Proceed with TCS with Dr. Gala Romney in near future: the risks, benefits, and alternatives have been discussed with the patient in detail. The patient states understanding and desires to proceed. Propofol due to polypharmacy Discussed following up with PCP due to incidental murmur heard on exam

## 2017-12-13 NOTE — H&P (View-Only) (Signed)
Primary Care Physician:  Rosita Fire, MD Primary Gastroenterologist:  Dr. Gala Romney   Chief Complaint  Patient presents with  . Colonoscopy    last tcs 2008    HPI:   Pamela Stein is a 62 y.o. female presenting today at the request of Dr. Legrand Rams for routine screening colonoscopy. Last in 2008 with diminutive rectal polyp (polypoid mucosa on path).   No abdominal pain, N/V. No changes in bowel habits. No rectal bleeding. No upper GI symptoms. No dysphagia. No unexplained weight loss or lack of appetite.    Past Medical History:  Diagnosis Date  . Back pain   . Bronchitis   . Complication of anesthesia    stopped breathing after endometrial ablation procedure prior to her being diagn. with OSA  . Diabetes mellitus   . Heart murmur   . Hyperlipidemia   . Hypertension   . Neuropathy   . Obesity   . Obstructive sleep apnea    CPAP    Past Surgical History:  Procedure Laterality Date  . ABDOMINAL HYSTERECTOMY    . ADENOIDECTOMY    . CATARACT EXTRACTION Left   . COLONOSCOPY  2008   diminutive rectal polyp, s/p removal. polypoid mucosa  . DILATION AND CURETTAGE OF UTERUS    . ENDOMETRIAL ABLATION    . EYE SURGERY Left 12/04/2013   cataract  . PARATHYROIDECTOMY N/A 12/22/2016   Procedure: PARATHYROIDECTOMY, NECK EXPLORATION;  Surgeon: Jackolyn Confer, MD;  Location: [facility];  Service: General;  Laterality: N/A;  . TONSILLECTOMY      Current Outpatient Medications  Medication Sig Dispense Refill  . ACCU-CHEK AVIVA PLUS test strip USE AS DIRECTED TO TEST TWICE DAILY 100 each 0  . ACCU-CHEK SOFTCLIX LANCETS lancets USE TO DRAW BLOOD FOR GLUCOSE TEST 100 each 4  . albuterol (PROVENTIL HFA;VENTOLIN HFA) 108 (90 Base) MCG/ACT inhaler Inhale 1-2 puffs into the lungs every 6 (six) hours as needed for wheezing or shortness of breath. 1 Inhaler 0  . allopurinol (ZYLOPRIM) 300 MG tablet TAKE 1 TABLET BY MOUTH DAILY 90 tablet 0  . amLODipine (NORVASC) 10 MG tablet TAKE 1  TABLET BY MOUTH DAILY 30 tablet 4  . aspirin EC 81 MG tablet Take 81 mg by mouth daily.    Marland Kitchen BIOTIN PO Take 1 tablet by mouth daily.    Marland Kitchen escitalopram (LEXAPRO) 10 MG tablet Take 1 tablet (10 mg total) by mouth daily. 30 tablet 6  . furosemide (LASIX) 20 MG tablet Take 1 tablet by mouth daily.  3  . gabapentin (NEURONTIN) 300 MG capsule Take 1 capsule (300 mg total) by mouth 3 (three) times daily. (Patient taking differently: Take 300 mg by mouth at bedtime. ) 90 capsule 3  . losartan (COZAAR) 25 MG tablet Take 1 tablet by mouth daily.  3  . metFORMIN (GLUCOPHAGE) 500 MG tablet TAKE 1 TABLET BY MOUTH TWICE DAILY WITH A MEAL (Patient taking differently: Take 500 mg by mouth daily. ) 180 tablet 1  . pravastatin (PRAVACHOL) 80 MG tablet TAKE 1 TABLET(80 MG) BY MOUTH EVERY EVENING 90 tablet 0  . spironolactone (ALDACTONE) 25 MG tablet Take 1 tablet by mouth daily.  3   No current facility-administered medications for this visit.     Allergies as of 12/13/2017 - Review Complete 12/13/2017  Allergen Reaction Noted  . Ace inhibitors Cough 10/27/2011    Family History  Problem Relation Age of Onset  . Cancer Father  throat  . Throat cancer Father   . Diabetes Maternal Grandmother   . Hypertension Maternal Grandfather   . Hypertension Paternal Grandmother   . Diabetes Paternal Grandfather   . Colon cancer Neg Hx   . Colon polyps Neg Hx     Social History   Socioeconomic History  . Marital status: Single    Spouse name: Not on file  . Number of children: Not on file  . Years of education: Not on file  . Highest education level: Not on file  Occupational History  . Occupation: Primary school teacher  Social Needs  . Financial resource strain: Not on file  . Food insecurity:    Worry: Not on file    Inability: Not on file  . Transportation needs:    Medical: Not on file    Non-medical: Not on file  Tobacco Use  . Smoking status: Never Smoker  . Smokeless tobacco: Never Used    Substance and Sexual Activity  . Alcohol use: No    Alcohol/week: 0.0 standard drinks  . Drug use: No  . Sexual activity: Not Currently    Birth control/protection: Surgical  Lifestyle  . Physical activity:    Days per week: Not on file    Minutes per session: Not on file  . Stress: Not on file  Relationships  . Social connections:    Talks on phone: Not on file    Gets together: Not on file    Attends religious service: Not on file    Active member of club or organization: Not on file    Attends meetings of clubs or organizations: Not on file    Relationship status: Not on file  . Intimate partner violence:    Fear of current or ex partner: Not on file    Emotionally abused: Not on file    Physically abused: Not on file    Forced sexual activity: Not on file  Other Topics Concern  . Not on file  Social History Narrative  . Not on file    Review of Systems: Gen: Denies any fever, chills, fatigue, weight loss, lack of appetite.  CV: Denies chest pain, heart palpitations, peripheral edema, syncope.  Resp: Denies shortness of breath at rest or with exertion. Denies wheezing or cough.  GI: see HPI  GU : Denies urinary burning, urinary frequency, urinary hesitancy MS: Denies joint pain, muscle weakness, cramps, or limitation of movement.  Derm: Denies rash, itching, dry skin Psych: Denies depression, anxiety, memory loss, and confusion Heme: Denies bruising, bleeding, and enlarged lymph nodes.  Physical Exam: BP 133/74   Pulse 98   Temp 98.8 F (37.1 C) (Oral)   Ht 5' 2.5" (1.588 m)   Wt 223 lb 3.2 oz (101.2 kg)   BMI 40.17 kg/m  General:   Alert and oriented. Pleasant and cooperative. Well-nourished and well-developed.  Head:  Normocephalic and atraumatic. Eyes:  Without icterus, sclera clear and conjunctiva pink.  Ears:  Normal auditory acuity. Nose:  No deformity, discharge,  or lesions. Mouth:  No deformity or lesions, oral mucosa pink.  Lungs:  Clear to  auscultation bilaterally. No wheezes, rales, or rhonchi. No distress.  Heart:  S1, S2 present with systolic murmur  Abdomen:  +BS, soft, non-tender and non-distended. No HSM noted. No guarding or rebound. No masses appreciated.  Rectal:  Deferred  Msk:  Symmetrical without gross deformities. Normal posture. Extremities:  Without edema. Neurologic:  Alert and  oriented x4 Psych:  Alert and  cooperative. Normal mood and affect.

## 2017-12-13 NOTE — Patient Instructions (Addendum)
We have scheduled you for a colonoscopy with Dr. Gala Romney in the near future.  Do not take metformin the day of the procedure.   Please talk to Dr. Legrand Rams when you see him at your next appointment regarding possible heart murmur appreciated on today's exam. He will likely order an echocardiogram.   It was a pleasure to see you today. I strive to create trusting relationships with patients to provide genuine, compassionate, and quality care. I value your feedback. If you receive a survey regarding your visit,  I greatly appreciate you taking time to fill this out.   Annitta Needs, PhD, ANP-BC [facility]

## 2017-12-14 ENCOUNTER — Telehealth: Payer: Self-pay

## 2017-12-14 NOTE — Telephone Encounter (Signed)
Tried to call pt to inform of pre-op appt 12/28/17 at 1:45pm. Rang once and stopped. Appt letter mailed.

## 2017-12-14 NOTE — Progress Notes (Signed)
cc'ed to pcp °

## 2017-12-20 DIAGNOSIS — H35363 Drusen (degenerative) of macula, bilateral: Secondary | ICD-10-CM | POA: Diagnosis not present

## 2017-12-24 NOTE — Patient Instructions (Signed)
Pamela Stein  12/24/2017     @PREFPERIOPPHARMACY @   Your procedure is scheduled on  01/03/2018 .  Report to [facility] at  [place]   A.M.  Call this number if you have problems the morning of surgery:  [phone]   Remember:  Follow the diet and prep instructions given to you by Dr Roseanne Kaufman office.                      Take these medicines the morning of surgery with A SIP OF WATER  Alloopurinol, amlodipine, lexapro, gabapentin, losartan. Use your inhaler before you come.    Do not wear jewelry, make-up or nail polish.  Do not wear lotions, powders, or perfumes, or deodorant.  Do not shave 48 hours prior to surgery.  Men may shave face and neck.  Do not bring valuables to the hospital.  [facility] is not responsible for any belongings or valuables.  Contacts, dentures or bridgework may not be worn into surgery.  Leave your suitcase in the car.  After surgery it may be brought to your room.  For patients admitted to the hospital, discharge time will be determined by your treatment team.  Patients discharged the day of surgery will not be allowed to drive home.   Name and phone number of your driver:   family Special instructions:  DO NOT take any medications for diabetes the morning of your procedure.  Please read over the following fact sheets that you were given. Anesthesia Post-op Instructions and Care and Recovery After Surgery       Colonoscopy, Adult A colonoscopy is an exam to look at the large intestine. It is done to check for problems, such as:  Lumps (tumors).  Growths (polyps).  Swelling (inflammation).  Bleeding.  What happens before the procedure? Eating and drinking Follow instructions from your doctor about eating and drinking. These instructions may include:  A few days before the procedure - follow a low-fiber diet. ? Avoid nuts. ? Avoid seeds. ? Avoid dried fruit. ? Avoid raw fruits. ? Avoid vegetables.  1-3 days  before the procedure - follow a clear liquid diet. Avoid liquids that have red or purple dye. Drink only clear liquids, such as: ? Clear broth or bouillon. ? Black coffee or tea. ? Clear juice. ? Clear soft drinks or sports drinks. ? Gelatin dessert. ? Popsicles.  On the day of the procedure - do not eat or drink anything during the 2 hours before the procedure.  Bowel prep If you were prescribed an oral bowel prep:  Take it as told by your doctor. Starting the day before your procedure, you will need to drink a lot of liquid. The liquid will cause you to poop (have bowel movements) until your poop is almost clear or light green.  If your skin or butt gets irritated from diarrhea, you may: ? Wipe the area with wipes that have medicine in them, such as adult wet wipes with aloe and vitamin E. ? Put something on your skin that soothes the area, such as petroleum jelly.  If you throw up (vomit) while drinking the bowel prep, take a break for up to 60 minutes. Then begin the bowel prep again. If you keep throwing up and you cannot take the bowel prep without throwing up, call your doctor.  General instructions  Ask your doctor about changing or stopping your normal medicines. This  is important if you take diabetes medicines or blood thinners.  Plan to have someone take you home from the hospital or clinic. What happens during the procedure?  An IV tube may be put into one of your veins.  You will be given medicine to help you relax (sedative).  To reduce your risk of infection: ? Your doctors will wash their hands. ? Your anal area will be washed with soap.  You will be asked to lie on your side with your knees bent.  Your doctor will get a long, thin, flexible tube ready. The tube will have a camera and a light on the end.  The tube will be put into your anus.  The tube will be gently put into your large intestine.  Air will be delivered into your large intestine to keep it  open. You may feel some pressure or cramping.  The camera will be used to take photos.  A small tissue sample may be removed from your body to be looked at under a microscope (biopsy). If any possible problems are found, the tissue will be sent to a lab for testing.  If small growths are found, your doctor may remove them and have them checked for cancer.  The tube that was put into your anus will be slowly removed. The procedure may vary among doctors and hospitals. What happens after the procedure?  Your doctor will check on you often until the medicines you were given have worn off.  Do not drive for 24 hours after the procedure.  You may have a small amount of blood in your poop.  You may pass gas.  You may have mild cramps or bloating in your belly (abdomen).  It is up to you to get the results of your procedure. Ask your doctor, or the department performing the procedure, when your results will be ready. This information is not intended to replace advice given to you by your health care provider. Make sure you discuss any questions you have with your health care provider. Document Released: 02/28/2010 Document Revised: 11/27/2015 Document Reviewed: 04/09/2015 Elsevier Interactive Patient Education  2017 [employer]  Colonoscopy, Adult, Care After This sheet gives you information about how to care for yourself after your procedure. Your health care provider may also give you more specific instructions. If you have problems or questions, contact your health care provider. What can I expect after the procedure? After the procedure, it is common to have:  A small amount of blood in your stool for 24 hours after the procedure.  Some gas.  Mild abdominal cramping or bloating.  Follow these instructions at home: General instructions   For the first 24 hours after the procedure: ? Do not drive or use machinery. ? Do not sign important documents. ? Do not drink  alcohol. ? Do your regular daily activities at a slower pace than normal. ? Eat soft, easy-to-digest foods. ? Rest often.  Take over-the-counter or prescription medicines only as told by your health care provider.  It is up to you to get the results of your procedure. Ask your health care provider, or the department performing the procedure, when your results will be ready. Relieving cramping and bloating  Try walking around when you have cramps or feel bloated.  Apply heat to your abdomen as told by your health care provider. Use a heat source that your health care provider recommends, such as a moist heat pack or a heating pad. ? Place  a towel between your skin and the heat source. ? Leave the heat on for 20-30 minutes. ? Remove the heat if your skin turns bright red. This is especially important if you are unable to feel pain, heat, or cold. You may have a greater risk of getting burned. Eating and drinking  Drink enough fluid to keep your urine clear or pale yellow.  Resume your normal diet as instructed by your health care provider. Avoid heavy or fried foods that are hard to digest.  Avoid drinking alcohol for as long as instructed by your health care provider. Contact a health care provider if:  You have blood in your stool 2-3 days after the procedure. Get help right away if:  You have more than a small spotting of blood in your stool.  You pass large blood clots in your stool.  Your abdomen is swollen.  You have nausea or vomiting.  You have a fever.  You have increasing abdominal pain that is not relieved with medicine. This information is not intended to replace advice given to you by your health care provider. Make sure you discuss any questions you have with your health care provider. Document Released: 09/10/2003 Document Revised: 10/21/2015 Document Reviewed: 04/09/2015 Elsevier Interactive Patient Education  2018 [place] Anesthesia is a term that refers to techniques, procedures, and medicines that help a person stay safe and comfortable during a medical procedure. Monitored anesthesia care, or sedation, is one type of anesthesia. Your anesthesia specialist may recommend sedation if you will be having a procedure that does not require you to be unconscious, such as:  Cataract surgery.  A dental procedure.  A biopsy.  A colonoscopy.  During the procedure, you may receive a medicine to help you relax (sedative). There are three levels of sedation:  Mild sedation. At this level, you may feel awake and relaxed. You will be able to follow directions.  Moderate sedation. At this level, you will be sleepy. You may not remember the procedure.  Deep sedation. At this level, you will be asleep. You will not remember the procedure.  The more medicine you are given, the deeper your level of sedation will be. Depending on how you respond to the procedure, the anesthesia specialist may change your level of sedation or the type of anesthesia to fit your needs. An anesthesia specialist will monitor you closely during the procedure. Let your health care provider know about:  Any allergies you have.  All medicines you are taking, including vitamins, herbs, eye drops, creams, and over-the-counter medicines.  Any use of steroids (by mouth or as a cream).  Any problems you or family members have had with sedatives and anesthetic medicines.  Any blood disorders you have.  Any surgeries you have had.  Any medical conditions you have, such as sleep apnea.  Whether you are pregnant or may be pregnant.  Any use of cigarettes, alcohol, or street drugs. What are the risks? Generally, this is a safe procedure. However, problems may occur, including:  Getting too much medicine (oversedation).  Nausea.  Allergic reaction to medicines.  Trouble breathing. If this happens, a breathing tube may be used to help  with breathing. It will be removed when you are awake and breathing on your own.  Heart trouble.  Lung trouble.  Before the procedure Staying hydrated Follow instructions from your health care provider about hydration, which may include:  Up to 2 hours before the procedure - you  may continue to drink clear liquids, such as water, clear fruit juice, black coffee, and plain tea.  Eating and drinking restrictions Follow instructions from your health care provider about eating and drinking, which may include:  8 hours before the procedure - stop eating heavy meals or foods such as meat, fried foods, or fatty foods.  6 hours before the procedure - stop eating light meals or foods, such as toast or cereal.  6 hours before the procedure - stop drinking milk or drinks that contain milk.  2 hours before the procedure - stop drinking clear liquids.  Medicines Ask your health care provider about:  Changing or stopping your regular medicines. This is especially important if you are taking diabetes medicines or blood thinners.  Taking medicines such as aspirin and ibuprofen. These medicines can thin your blood. Do not take these medicines before your procedure if your health care provider instructs you not to.  Tests and exams  You will have a physical exam.  You may have blood tests done to show: ? How well your kidneys and liver are working. ? How well your blood can clot.  General instructions  Plan to have someone take you home from the hospital or clinic.  If you will be going home right after the procedure, plan to have someone with you for 24 hours.  What happens during the procedure?  Your blood pressure, heart rate, breathing, level of pain and overall condition will be monitored.  An IV tube will be inserted into one of your veins.  Your anesthesia specialist will give you medicines as needed to keep you comfortable during the procedure. This may mean changing the level  of sedation.  The procedure will be performed. After the procedure  Your blood pressure, heart rate, breathing rate, and blood oxygen level will be monitored until the medicines you were given have worn off.  Do not drive for 24 hours if you received a sedative.  You may: ? Feel sleepy, clumsy, or nauseous. ? Feel forgetful about what happened after the procedure. ? Have a sore throat if you had a breathing tube during the procedure. ? Vomit. This information is not intended to replace advice given to you by your health care provider. Make sure you discuss any questions you have with your health care provider. Document Released: 10/22/2004 Document Revised: 07/05/2015 Document Reviewed: 05/19/2015 Elsevier Interactive Patient Education  2018 [place], Care After These instructions provide you with information about caring for yourself after your procedure. Your health care provider may also give you more specific instructions. Your treatment has been planned according to current medical practices, but problems sometimes occur. Call your health care provider if you have any problems or questions after your procedure. What can I expect after the procedure? After your procedure, it is common to:  Feel sleepy for several hours.  Feel clumsy and have poor balance for several hours.  Feel forgetful about what happened after the procedure.  Have poor judgment for several hours.  Feel nauseous or vomit.  Have a sore throat if you had a breathing tube during the procedure.  Follow these instructions at home: For at least 24 hours after the procedure:   Do not: ? Participate in activities in which you could fall or become injured. ? Drive. ? Use heavy machinery. ? Drink alcohol. ? Take sleeping pills or medicines that cause drowsiness. ? Make important decisions or sign legal documents. ? Take care of  children on your own.  Rest. Eating and  drinking  Follow the diet that is recommended by your health care provider.  If you vomit, drink water, juice, or soup when you can drink without vomiting.  Make sure you have little or no nausea before eating solid foods. General instructions  Have a responsible adult stay with you until you are awake and alert.  Take over-the-counter and prescription medicines only as told by your health care provider.  If you smoke, do not smoke without supervision.  Keep all follow-up visits as told by your health care provider. This is important. Contact a health care provider if:  You keep feeling nauseous or you keep vomiting.  You feel light-headed.  You develop a rash.  You have a fever. Get help right away if:  You have trouble breathing. This information is not intended to replace advice given to you by your health care provider. Make sure you discuss any questions you have with your health care provider. Document Released: 05/19/2015 Document Revised: 09/18/2015 Document Reviewed: 05/19/2015 Elsevier Interactive Patient Education  [employer].

## 2017-12-28 ENCOUNTER — Other Ambulatory Visit: Payer: Self-pay

## 2017-12-28 ENCOUNTER — Encounter (HOSPITAL_COMMUNITY): Payer: Self-pay

## 2017-12-28 ENCOUNTER — Encounter (HOSPITAL_COMMUNITY)
Admission: RE | Admit: 2017-12-28 | Discharge: 2017-12-28 | Disposition: A | Discharge status: Home / Self Care | Payer: Federal, State, Local not specified - PPO | Source: Ambulatory Visit | Attending: Internal Medicine | Admitting: Internal Medicine

## 2017-12-28 DIAGNOSIS — D509 Iron deficiency anemia, unspecified: Secondary | ICD-10-CM | POA: Insufficient documentation

## 2017-12-28 DIAGNOSIS — Z01818 Encounter for other preprocedural examination: Secondary | ICD-10-CM | POA: Diagnosis not present

## 2017-12-28 HISTORY — DX: Anxiety disorder, unspecified: F41.9

## 2017-12-28 HISTORY — DX: Personal history of other diseases of the musculoskeletal system and connective tissue: Z87.39

## 2017-12-28 LAB — CBC WITH DIFFERENTIAL/PLATELET
ABS IMMATURE GRANULOCYTES: 0.04 10*3/uL (ref 0.00–0.07)
BASOS ABS: 0.1 10*3/uL (ref 0.0–0.1)
BASOS PCT: 1 %
Eosinophils Absolute: 0.2 10*3/uL (ref 0.0–0.5)
Eosinophils Relative: 2 %
HCT: 40 % (ref 36.0–46.0)
Hemoglobin: 11.9 g/dL — ABNORMAL LOW (ref 12.0–15.0)
Immature Granulocytes: 0 %
Lymphocytes Relative: 34 %
Lymphs Abs: 3.6 10*3/uL (ref 0.7–4.0)
MCH: 21.2 pg — ABNORMAL LOW (ref 26.0–34.0)
MCHC: 29.8 g/dL — ABNORMAL LOW (ref 30.0–36.0)
MCV: 71.2 fL — AB (ref 80.0–100.0)
MONOS PCT: 8 %
Monocytes Absolute: 0.9 10*3/uL (ref 0.1–1.0)
NEUTROS ABS: 5.7 10*3/uL (ref 1.7–7.7)
Neutrophils Relative %: 55 %
PLATELETS: 349 10*3/uL (ref 150–400)
RBC: 5.62 MIL/uL — AB (ref 3.87–5.11)
RDW: 18.4 % — AB (ref 11.5–15.5)
WBC: 10.5 10*3/uL (ref 4.0–10.5)
nRBC: 0 % (ref 0.0–0.2)

## 2017-12-28 LAB — BASIC METABOLIC PANEL
ANION GAP: 7 (ref 5–15)
BUN: 19 mg/dL (ref 8–23)
CO2: 25 mmol/L (ref 22–32)
Calcium: 10.3 mg/dL (ref 8.9–10.3)
Chloride: 105 mmol/L (ref 98–111)
Creatinine, Ser: 1.1 mg/dL — ABNORMAL HIGH (ref 0.44–1.00)
GFR calc Af Amer: >60 mL/min (ref >60)
GFR, EST NON AFRICAN AMERICAN: 52 mL/min — AB (ref >60)
GLUCOSE: 114 mg/dL — AB (ref 70–99)
POTASSIUM: 3.8 mmol/L (ref 3.5–5.1)
SODIUM: 137 mmol/L (ref 135–145)

## 2017-12-29 ENCOUNTER — Other Ambulatory Visit: Payer: Self-pay

## 2017-12-29 DIAGNOSIS — D649 Anemia, unspecified: Secondary | ICD-10-CM

## 2017-12-29 NOTE — Progress Notes (Signed)
Microcytic anemia noted, chronic. Let's add iron, ferritin, TIBCs. History of chronic renal insufficiency. Creatinine stable.

## 2017-12-30 DIAGNOSIS — D649 Anemia, unspecified: Secondary | ICD-10-CM | POA: Diagnosis not present

## 2017-12-30 LAB — IRON,TIBC AND FERRITIN PANEL
%SAT: 16 % (calc) (ref 16–45)
Ferritin: 51 ng/mL (ref 16–288)
IRON: 61 ug/dL (ref 45–160)
TIBC: 392 mcg/dL (calc) (ref 250–450)

## 2017-12-30 NOTE — Progress Notes (Signed)
CC'D TO PCP °

## 2018-01-03 ENCOUNTER — Encounter (HOSPITAL_COMMUNITY)
Admission: RE | Disposition: A | Discharge status: Home / Self Care | Payer: Self-pay | Source: Ambulatory Visit | Attending: Internal Medicine

## 2018-01-03 ENCOUNTER — Ambulatory Visit (HOSPITAL_COMMUNITY)
Admission: RE | Admit: 2018-01-03 | Discharge: 2018-01-03 | Disposition: A | Discharge status: Home / Self Care | Payer: Federal, State, Local not specified - PPO | Source: Ambulatory Visit | Attending: Internal Medicine | Admitting: Internal Medicine

## 2018-01-03 ENCOUNTER — Ambulatory Visit (HOSPITAL_COMMUNITY): Payer: Federal, State, Local not specified - PPO | Admitting: Anesthesiology

## 2018-01-03 ENCOUNTER — Encounter (HOSPITAL_COMMUNITY): Payer: Self-pay

## 2018-01-03 DIAGNOSIS — E1140 Type 2 diabetes mellitus with diabetic neuropathy, unspecified: Secondary | ICD-10-CM | POA: Diagnosis not present

## 2018-01-03 DIAGNOSIS — Z7982 Long term (current) use of aspirin: Secondary | ICD-10-CM | POA: Insufficient documentation

## 2018-01-03 DIAGNOSIS — Z6841 Body Mass Index (BMI) 40.0 and over, adult: Secondary | ICD-10-CM | POA: Diagnosis not present

## 2018-01-03 DIAGNOSIS — Z79899 Other long term (current) drug therapy: Secondary | ICD-10-CM | POA: Diagnosis not present

## 2018-01-03 DIAGNOSIS — F419 Anxiety disorder, unspecified: Secondary | ICD-10-CM | POA: Insufficient documentation

## 2018-01-03 DIAGNOSIS — I10 Essential (primary) hypertension: Secondary | ICD-10-CM | POA: Insufficient documentation

## 2018-01-03 DIAGNOSIS — E785 Hyperlipidemia, unspecified: Secondary | ICD-10-CM | POA: Insufficient documentation

## 2018-01-03 DIAGNOSIS — Z7984 Long term (current) use of oral hypoglycemic drugs: Secondary | ICD-10-CM | POA: Diagnosis not present

## 2018-01-03 DIAGNOSIS — G4733 Obstructive sleep apnea (adult) (pediatric): Secondary | ICD-10-CM | POA: Diagnosis not present

## 2018-01-03 DIAGNOSIS — Z1211 Encounter for screening for malignant neoplasm of colon: Secondary | ICD-10-CM | POA: Diagnosis not present

## 2018-01-03 HISTORY — PX: COLONOSCOPY WITH PROPOFOL: SHX5780

## 2018-01-03 LAB — GLUCOSE, CAPILLARY
GLUCOSE-CAPILLARY: 94 mg/dL (ref 70–99)
Glucose-Capillary: 88 mg/dL (ref 70–99)

## 2018-01-03 SURGERY — COLONOSCOPY WITH PROPOFOL
Anesthesia: Monitor Anesthesia Care

## 2018-01-03 MED ORDER — LIDOCAINE HCL (PF) 1 % IJ SOLN
INTRAMUSCULAR | Status: AC
  Filled 2018-01-03: qty 5

## 2018-01-03 MED ORDER — PROPOFOL 500 MG/50ML IV EMUL
INTRAVENOUS | Status: DC | PRN
  Administered 2018-01-03: 150 ug/kg/min via INTRAVENOUS

## 2018-01-03 MED ORDER — PROPOFOL 10 MG/ML IV BOLUS
INTRAVENOUS | Status: AC
  Filled 2018-01-03: qty 60

## 2018-01-03 MED ORDER — CHLORHEXIDINE GLUCONATE CLOTH 2 % EX PADS: 6.0000 | MEDICATED_PAD | Freq: Once | CUTANEOUS | Status: DC

## 2018-01-03 MED ORDER — GLYCOPYRROLATE 0.2 MG/ML IJ SOLN
INTRAMUSCULAR | Status: AC
  Filled 2018-01-03: qty 1

## 2018-01-03 MED ORDER — PROPOFOL 10 MG/ML IV BOLUS
INTRAVENOUS | Status: DC | PRN
  Administered 2018-01-03: 20 mg via INTRAVENOUS
  Administered 2018-01-03: 40 mg via INTRAVENOUS

## 2018-01-03 MED ORDER — STERILE WATER FOR IRRIGATION IR SOLN
Status: DC | PRN
  Administered 2018-01-03: 1.5 mL

## 2018-01-03 MED ORDER — LACTATED RINGERS IV SOLN
INTRAVENOUS | Status: DC
  Administered 2018-01-03: 11:00:00 via INTRAVENOUS

## 2018-01-03 NOTE — Anesthesia Procedure Notes (Signed)
Procedure Name: MAC Performed by: Zen Felling A, CRNA Pre-anesthesia Checklist: Patient identified, Emergency Drugs available, Suction available, Timeout performed and Patient being monitored Patient Re-evaluated:Patient Re-evaluated prior to induction Oxygen Delivery Method: Non-rebreather mask       

## 2018-01-03 NOTE — Anesthesia Postprocedure Evaluation (Signed)
Anesthesia Post Note  Patient: Pamela Stein  Procedure(s) Performed: COLONOSCOPY WITH PROPOFOL (N/A )  Patient location during evaluation: PACU Anesthesia Type: MAC Level of consciousness: awake and alert and oriented Pain management: pain level controlled Vital Signs Assessment: post-procedure vital signs reviewed and stable Respiratory status: spontaneous breathing Cardiovascular status: stable Postop Assessment: no apparent nausea or vomiting Anesthetic complications: no     Last Vitals:  Vitals:   01/03/18 1032  BP: 134/60  Pulse: 63  Resp: 18  Temp: 37.1 C  SpO2: 98%    Last Pain:  Vitals:   01/03/18 1058  TempSrc:   PainSc: 0-No pain                 ADAMS, AMY A

## 2018-01-03 NOTE — Discharge Instructions (Signed)
Monitored Anesthesia Care, Care After These instructions provide you with information about caring for yourself after your procedure. Your health care provider may also give you more specific instructions. Your treatment has been planned according to current medical practices, but problems sometimes occur. Call your health care provider if you have any problems or questions after your procedure. What can I expect after the procedure? After your procedure, it is common to:  Feel sleepy for several hours.  Feel clumsy and have poor balance for several hours.  Feel forgetful about what happened after the procedure.  Have poor judgment for several hours.  Feel nauseous or vomit.  Have a sore throat if you had a breathing tube during the procedure.  Follow these instructions at home: For at least 24 hours after the procedure:   Do not: ? Participate in activities in which you could fall or become injured. ? Drive. ? Use heavy machinery. ? Drink alcohol. ? Take sleeping pills or medicines that cause drowsiness. ? Make important decisions or sign legal documents. ? Take care of children on your own.  Rest. Eating and drinking  Follow the diet that is recommended by your health care provider.  If you vomit, drink water, juice, or soup when you can drink without vomiting.  Make sure you have little or no nausea before eating solid foods. General instructions  Have a responsible adult stay with you until you are awake and alert.  Take over-the-counter and prescription medicines only as told by your health care provider.  If you smoke, do not smoke without supervision.  Keep all follow-up visits as told by your health care provider. This is important. Contact a health care provider if:  You keep feeling nauseous or you keep vomiting.  You feel light-headed.  You develop a rash.  You have a fever. Get help right away if:  You have trouble breathing. This information is  not intended to replace advice given to you by your health care provider. Make sure you discuss any questions you have with your health care provider. Document Released: 05/19/2015 Document Revised: 09/18/2015 Document Reviewed: 05/19/2015 Elsevier Interactive Patient Education  2018 [employer].  Colonoscopy Discharge Instructions  Read the instructions outlined below and refer to this sheet in the next few weeks. These discharge instructions provide you with general information on caring for yourself after you leave the hospital. Your doctor may also give you specific instructions. While your treatment has been planned according to the most current medical practices available, unavoidable complications occasionally occur. If you have any problems or questions after discharge, call Dr. Gala Romney at [phone]. ACTIVITY  You may resume your regular activity, but move at a slower pace for the next 24 hours.   Take frequent rest periods for the next 24 hours.   Walking will help get rid of the air and reduce the bloated feeling in your belly (abdomen).   No driving for 24 hours (because of the medicine (anesthesia) used during the test).    Do not sign any important legal documents or operate any machinery for 24 hours (because of the anesthesia used during the test).  NUTRITION  Drink plenty of fluids.   You may resume your normal diet as instructed by your doctor.   Begin with a light meal and progress to your normal diet. Heavy or fried foods are harder to digest and may make you feel sick to your stomach (nauseated).   Avoid alcoholic beverages for 24 hours or as  instructed.  MEDICATIONS  You may resume your normal medications unless your doctor tells you otherwise.  WHAT YOU CAN EXPECT TODAY  Some feelings of bloating in the abdomen.   Passage of more gas than usual.   Spotting of blood in your stool or on the toilet paper.  IF YOU HAD POLYPS REMOVED DURING THE COLONOSCOPY:  No  aspirin products for 7 days or as instructed.   No alcohol for 7 days or as instructed.   Eat a soft diet for the next 24 hours.  FINDING OUT THE RESULTS OF YOUR TEST Not all test results are available during your visit. If your test results are not back during the visit, make an appointment with your caregiver to find out the results. Do not assume everything is normal if you have not heard from your caregiver or the medical facility. It is important for you to follow up on all of your test results.  SEEK IMMEDIATE MEDICAL ATTENTION IF:  You have more than a spotting of blood in your stool.   Your belly is swollen (abdominal distention).   You are nauseated or vomiting.   You have a temperature over 101.   You have abdominal pain or discomfort that is severe or gets worse throughout the day.    Repeat colonoscopy in 10 years

## 2018-01-03 NOTE — Anesthesia Preprocedure Evaluation (Signed)
Anesthesia Evaluation  Patient identified by MRN, date of birth, ID band Patient awake    Reviewed: Allergy & Precautions, H&P , NPO status , Patient's Chart, lab work & pertinent test results, reviewed documented beta blocker date and time   History of Anesthesia Complications (+) history of anesthetic complications  Airway Mallampati: I  TM Distance: >3 FB Neck ROM: full    Dental no notable dental hx.    Pulmonary sleep apnea ,    Pulmonary exam normal breath sounds clear to auscultation       Cardiovascular Exercise Tolerance: Good hypertension, + Valvular Problems/Murmurs  Rhythm:regular Rate:Normal     Neuro/Psych PSYCHIATRIC DISORDERS Anxiety negative neurological ROS     GI/Hepatic negative GI ROS, Neg liver ROS,   Endo/Other  diabetesMorbid obesity  Renal/GU negative Renal ROS  negative genitourinary   Musculoskeletal   Abdominal   Peds  Hematology negative hematology ROS (+)   Anesthesia Other Findings   Reproductive/Obstetrics negative OB ROS                             Anesthesia Physical Anesthesia Plan  ASA: III  Anesthesia Plan: MAC   Post-op Pain Management:    Induction:   PONV Risk Score and Plan:   Airway Management Planned:   Additional Equipment:   Intra-op Plan:   Post-operative Plan:   Informed Consent: I have reviewed the patients History and Physical, chart, labs and discussed the procedure including the risks, benefits and alternatives for the proposed anesthesia with the patient or authorized representative who has indicated his/her understanding and acceptance.     Plan Discussed with: CRNA  Anesthesia Plan Comments:         Anesthesia Quick Evaluation

## 2018-01-03 NOTE — Op Note (Signed)
[facility] Patient Name: Pamela Stein Procedure Date: 01/03/2018 10:35 AM MRN: [id number] Date of Birth: Sep 20, 1955 Attending MD: Norvel Richards , MD CSN: [id number] Age: 62 Admit Type: Outpatient Procedure:                Colonoscopy Indications:              Screening for colorectal malignant neoplasm Providers:                Norvel Richards, MD, Gerome Sam, RN,                            Aram Candela Referring MD:             Rosita Fire MD, MD Medicines:                Propofol per Anesthesia Complications:            No immediate complications. Estimated Blood Loss:     Estimated blood loss: none. Procedure:                Pre-Anesthesia Assessment:                           - Prior to the procedure, a History and Physical                            was performed, and patient medications and                            allergies were reviewed. The patient's tolerance of                            previous anesthesia was also reviewed. The risks                            and benefits of the procedure and the sedation                            options and risks were discussed with the patient.                            All questions were answered, and informed consent                            was obtained. Prior Anticoagulants: The patient has                            taken no previous anticoagulant or antiplatelet                            agents. ASA Grade Assessment: II - A patient with                            mild systemic disease. After reviewing the risks  and benefits, the patient was deemed in                            satisfactory condition to undergo the procedure.                           After obtaining informed consent, the colonoscope                            was passed under direct vision. Throughout the                            procedure, the patient's blood pressure, pulse, and              oxygen saturations were monitored continuously. The                            [id number]-[id number] ([id number]) scope was introduced through                            the and advanced to the the cecum, identified by                            appendiceal orifice and ileocecal valve. The                            colonoscopy was performed without difficulty. The                            patient tolerated the procedure well. The quality                            of the bowel preparation was adequate. The                            ileocecal valve, appendiceal orifice, and rectum                            were photographed. The entire colon was well                            visualized. Scope In: 11:02:52 AM Scope Out: 11:12:20 AM Scope Withdrawal Time: 0 hours 6 minutes 58 seconds  Total Procedure Duration: 0 hours 9 minutes 28 seconds  Findings:      The perianal and digital rectal examinations were normal.      The colon (entire examined portion) appeared normal. Impression:               - The entire examined colon is normal.                           - No specimens collected. Moderate Sedation:      Moderate (conscious) sedation was personally administered by an       anesthesia professional. The following parameters were monitored: oxygen       saturation, heart  rate, blood pressure, respiratory rate, EKG, adequacy       of pulmonary ventilation, and response to care. Recommendation:           - Patient has a contact number available for                            emergencies. The signs and symptoms of potential                            delayed complications were discussed with the                            patient. Return to normal activities tomorrow.                            Written discharge instructions were provided to the                            patient.                           - Resume previous diet.                           - Continue present medications.                            - Repeat colonoscopy in 10 years for screening                            purposes.                           - Return to GI office PRN. Procedure Code(s):        --- Professional ---                           [phone], Colonoscopy, flexible; diagnostic, including                            collection of specimen(s) by brushing or washing,                            when performed (separate procedure) Diagnosis Code(s):        --- Professional ---                           Z12.11, Encounter for screening for malignant                            neoplasm of colon CPT copyright 2018 American Medical Association. All rights reserved. The codes documented in this report are preliminary and upon coder review may  be revised to meet current compliance requirements. Cristopher Estimable. Rourk, MD Norvel Richards, MD 01/03/2018 11:17:22 AM This report has been signed electronically. Number of Addenda: 0

## 2018-01-03 NOTE — Interval H&P Note (Signed)
History and Physical Interval Note:  01/03/2018 10:51 AM  Pamela Stein  has presented today for surgery, with the diagnosis of screening colonoscopy  The various methods of treatment have been discussed with the patient and family. After consideration of risks, benefits and other options for treatment, the patient has consented to  Procedure(s) with comments: COLONOSCOPY WITH PROPOFOL (N/A) - 12:00pm as a surgical intervention .  The patient's history has been reviewed, patient examined, no change in status, stable for surgery.  I have reviewed the patient's chart and labs.  Questions were answered to the patient's satisfaction.     Pamela Stein  No change.  Patient here for screening colonoscopy per plan.  The risks, benefits, limitations, alternatives and imponderables have been reviewed with the patient. Questions have been answered. All parties are agreeable.

## 2018-01-03 NOTE — Transfer of Care (Signed)
Immediate Anesthesia Transfer of Care Note  Patient: Pamela Stein  Procedure(s) Performed: COLONOSCOPY WITH PROPOFOL (N/A )  Patient Location: PACU  Anesthesia Type:MAC  Level of Consciousness: awake, alert , oriented and patient cooperative  Airway & Oxygen Therapy: Patient Spontanous Breathing  Post-op Assessment: Report given to RN and Post -op Vital signs reviewed and stable  Post vital signs: Reviewed and stable  Last Vitals:  Vitals Value Taken Time  BP    Temp    Pulse 65 01/03/2018 11:19 AM  Resp 11 01/03/2018 11:19 AM  SpO2 91 % 01/03/2018 11:19 AM  Vitals shown include unvalidated device data.  Last Pain:  Vitals:   01/03/18 1058  TempSrc:   PainSc: 0-No pain         Complications: No apparent anesthesia complications

## 2018-01-04 ENCOUNTER — Telehealth: Payer: Self-pay | Admitting: Internal Medicine

## 2018-01-04 NOTE — Telephone Encounter (Signed)
Letter is ready for pickup. Pt is aware.

## 2018-01-04 NOTE — Telephone Encounter (Signed)
lmom for pt. Waiting on a return call.

## 2018-01-04 NOTE — Telephone Encounter (Signed)
Colonoscopy was yesterday.  Should be doing well by tomorrow.  However, go ahead and give her a work excuse due to "recent medical procedure" through Thursday.  May return to work /previous activities on Friday.

## 2018-01-04 NOTE — Telephone Encounter (Signed)
RMR, pt would like a work note to be out through Thursday night. Please advise if this is ok to do.

## 2018-01-04 NOTE — Telephone Encounter (Signed)
[place] PATIENT, SHE TOLD HER EMPLOYER THAT SHE COULD NOT COME TO WORK THE DAY AFTER HER PROCEDURE BECAUSE HER "SYSTEM IS FUNNY" AND WANTS A WORK NOTE FOR THROUGH Thursday.

## 2018-01-04 NOTE — Progress Notes (Signed)
Does not appear to have IDA. Likely anemia secondary to chronic disease.

## 2018-01-10 ENCOUNTER — Encounter (HOSPITAL_COMMUNITY): Payer: Self-pay | Admitting: Internal Medicine

## 2018-02-11 DIAGNOSIS — H2511 Age-related nuclear cataract, right eye: Secondary | ICD-10-CM | POA: Diagnosis not present

## 2018-04-06 DIAGNOSIS — J029 Acute pharyngitis, unspecified: Secondary | ICD-10-CM | POA: Diagnosis not present

## 2018-04-06 DIAGNOSIS — J22 Unspecified acute lower respiratory infection: Secondary | ICD-10-CM | POA: Diagnosis not present

## 2018-04-06 DIAGNOSIS — R05 Cough: Secondary | ICD-10-CM | POA: Diagnosis not present

## 2018-04-06 DIAGNOSIS — R6883 Chills (without fever): Secondary | ICD-10-CM | POA: Diagnosis not present

## 2018-04-15 DIAGNOSIS — R918 Other nonspecific abnormal finding of lung field: Secondary | ICD-10-CM | POA: Diagnosis not present

## 2018-04-15 DIAGNOSIS — J22 Unspecified acute lower respiratory infection: Secondary | ICD-10-CM | POA: Diagnosis not present

## 2018-05-31 DIAGNOSIS — E119 Type 2 diabetes mellitus without complications: Secondary | ICD-10-CM | POA: Diagnosis not present

## 2018-05-31 DIAGNOSIS — I10 Essential (primary) hypertension: Secondary | ICD-10-CM | POA: Diagnosis not present

## 2018-06-15 DIAGNOSIS — M25511 Pain in right shoulder: Secondary | ICD-10-CM | POA: Diagnosis not present

## 2018-06-15 DIAGNOSIS — M542 Cervicalgia: Secondary | ICD-10-CM | POA: Diagnosis not present

## 2018-06-15 DIAGNOSIS — M545 Low back pain: Secondary | ICD-10-CM | POA: Diagnosis not present

## 2018-06-15 DIAGNOSIS — E119 Type 2 diabetes mellitus without complications: Secondary | ICD-10-CM | POA: Diagnosis not present

## 2018-06-17 DIAGNOSIS — R51 Headache: Secondary | ICD-10-CM | POA: Diagnosis not present

## 2018-06-20 DIAGNOSIS — M542 Cervicalgia: Secondary | ICD-10-CM | POA: Diagnosis not present

## 2018-06-20 DIAGNOSIS — E119 Type 2 diabetes mellitus without complications: Secondary | ICD-10-CM | POA: Diagnosis not present

## 2018-06-20 DIAGNOSIS — R51 Headache: Secondary | ICD-10-CM | POA: Diagnosis not present

## 2018-06-20 DIAGNOSIS — M545 Low back pain: Secondary | ICD-10-CM | POA: Diagnosis not present

## 2018-06-22 ENCOUNTER — Telehealth: Payer: Self-pay | Admitting: *Deleted

## 2018-06-22 ENCOUNTER — Encounter: Payer: Self-pay | Admitting: Diagnostic Neuroimaging

## 2018-06-22 ENCOUNTER — Encounter: Payer: Self-pay | Admitting: *Deleted

## 2018-06-22 NOTE — Addendum Note (Signed)
Addended by: Florian Buff C on: 06/22/2018 12:07 PM   Modules accepted: Orders

## 2018-06-22 NOTE — Telephone Encounter (Signed)
LVM requesting call back to update EMR.  

## 2018-06-22 NOTE — Telephone Encounter (Signed)
Spoke with patient and updated EMR. 

## 2018-06-23 ENCOUNTER — Other Ambulatory Visit: Payer: Self-pay

## 2018-06-23 ENCOUNTER — Encounter: Payer: Self-pay | Admitting: Diagnostic Neuroimaging

## 2018-06-23 ENCOUNTER — Ambulatory Visit (INDEPENDENT_AMBULATORY_CARE_PROVIDER_SITE_OTHER): Payer: Federal, State, Local not specified - PPO | Admitting: Diagnostic Neuroimaging

## 2018-06-23 DIAGNOSIS — M542 Cervicalgia: Secondary | ICD-10-CM | POA: Diagnosis not present

## 2018-06-23 DIAGNOSIS — M545 Low back pain, unspecified: Secondary | ICD-10-CM

## 2018-06-23 DIAGNOSIS — G44309 Post-traumatic headache, unspecified, not intractable: Secondary | ICD-10-CM

## 2018-06-23 NOTE — Progress Notes (Signed)
[facility]  PATIENT: Pamela Stein DOB: 1955-02-15  REFERRING CLINICIAN: Bujanowski HISTORY FROM: patient  REASON FOR VISIT: new consult    HISTORICAL  CHIEF COMPLAINT:  Chief Complaint  Patient presents with  . Fall  . Muscle Pain  . Pain    HISTORY OF PRESENT ILLNESS:    63 year old female here for evaluation of neck pain, low back pain, headaches, after falling down on 06/13/18.  Patient was at a friend's house climbing up steps when she lost her balance and fell backwards.  She landed on her back and hit her head.  She had headache, neck pain and low back pain.  She was about 5 steps up when she fell down.  The steps were made of concrete.  Patient did not lose consciousness.  Patient went to urgent care for evaluation.  She had CT scan of the head and x-rays of her neck and low back which apparently were unremarkable.  Patient referred here for further evaluation.  Patient has been using over-the-counter medications with mild relief.  She still feels a little bit off balance.  Still has mild headaches.   REVIEW OF SYSTEMS: Full 14 system review of systems performed and negative with exception of: As per HPI.  ALLERGIES: Allergies  Allergen Reactions  . Ace Inhibitors Cough    HOME MEDICATIONS: Outpatient Medications Prior to Visit  Medication Sig Dispense Refill  . albuterol (PROVENTIL HFA;VENTOLIN HFA) 108 (90 Base) MCG/ACT inhaler Inhale 1-2 puffs into the lungs every 6 (six) hours as needed for wheezing or shortness of breath. 1 Inhaler 0  . amLODipine (NORVASC) 10 MG tablet TAKE 1 TABLET BY MOUTH DAILY (Patient taking differently: Take 10 mg by mouth daily. ) 30 tablet 4  . aspirin EC 81 MG tablet Take 81 mg by mouth daily.    Marland Kitchen escitalopram (LEXAPRO) 10 MG tablet Take 1 tablet (10 mg total) by mouth daily. 30 tablet 6  . furosemide (LASIX) 20 MG tablet Take 20 mg by mouth daily.   3  . gabapentin (NEURONTIN) 300 MG capsule Take 1 capsule  (300 mg total) by mouth 3 (three) times daily. (Patient taking differently: Take 300 mg by mouth daily. ) 90 capsule 3  . losartan (COZAAR) 25 MG tablet Take 25 mg by mouth daily.   3  . metFORMIN (GLUCOPHAGE) 500 MG tablet TAKE 1 TABLET BY MOUTH TWICE DAILY WITH A MEAL (Patient taking differently: Take 500 mg by mouth daily with lunch. ) 180 tablet 1  . pravastatin (PRAVACHOL) 80 MG tablet TAKE 1 TABLET(80 MG) BY MOUTH EVERY EVENING (Patient taking differently: Take 80 mg by mouth every evening. ) 90 tablet 0  . spironolactone (ALDACTONE) 25 MG tablet Take 25 mg by mouth daily.   3   No facility-administered medications prior to visit.     PAST MEDICAL HISTORY: Past Medical History:  Diagnosis Date  . Anxiety   . Back pain   . Bronchitis   . Complication of anesthesia    stopped breathing after endometrial ablation procedure prior to her being diagn. with OSA  . Diabetes mellitus   . Dizziness   . Heart murmur   . History of gout   . Hyperlipidemia   . Hypertension   . Neuropathy   . Obesity   . Obstructive sleep apnea    CPAP    PAST SURGICAL HISTORY: Past Surgical History:  Procedure Laterality Date  . ABDOMINAL HYSTERECTOMY    . ADENOIDECTOMY    .  CATARACT EXTRACTION Left    right 02/2018  . COLONOSCOPY  2008   diminutive rectal polyp, s/p removal. polypoid mucosa  . COLONOSCOPY WITH PROPOFOL N/A 01/03/2018   Procedure: COLONOSCOPY WITH PROPOFOL;  Surgeon: Daneil Dolin, MD;  Location: [facility];  Service: Endoscopy;  Laterality: N/A;  12:00pm  . DILATION AND CURETTAGE OF UTERUS    . ENDOMETRIAL ABLATION    . EYE SURGERY Left 12/04/2013   cataract  . PARATHYROIDECTOMY N/A 12/22/2016   Procedure: PARATHYROIDECTOMY, NECK EXPLORATION;  Surgeon: Jackolyn Confer, MD;  Location: [facility];  Service: General;  Laterality: N/A;  . TONSILLECTOMY      FAMILY HISTORY: Family History  Problem Relation Age of Onset  . Cancer Father        throat  . Throat cancer Father    . Hypertension Father   . Diabetes Father   . Diabetes Maternal Grandmother   . Hypertension Maternal Grandfather   . Hypertension Paternal Grandmother   . Diabetes Paternal Grandfather   . Hypertension Mother   . Diabetes Mother   . Cancer Paternal Aunt   . Colon cancer Neg Hx   . Colon polyps Neg Hx     SOCIAL HISTORY: Social History   Socioeconomic History  . Marital status: Single    Spouse name: Not on file  . Number of children: Not on file  . Years of education: Not on file  . Highest education level: Associate degree: occupational, Hotel manager, or vocational program  Occupational History  . Occupation: Primary school teacher  Social Needs  . Financial resource strain: Not on file  . Food insecurity:    Worry: Not on file    Inability: Not on file  . Transportation needs:    Medical: Not on file    Non-medical: Not on file  Tobacco Use  . Smoking status: Never Smoker  . Smokeless tobacco: Never Used  Substance and Sexual Activity  . Alcohol use: No    Alcohol/week: 0.0 standard drinks  . Drug use: No  . Sexual activity: Not Currently    Birth control/protection: Surgical  Lifestyle  . Physical activity:    Days per week: Not on file    Minutes per session: Not on file  . Stress: Not on file  Relationships  . Social connections:    Talks on phone: Not on file    Gets together: Not on file    Attends religious service: Not on file    Active member of club or organization: Not on file    Attends meetings of clubs or organizations: Not on file    Relationship status: Not on file  . Intimate partner violence:    Fear of current or ex partner: Not on file    Emotionally abused: Not on file    Physically abused: Not on file    Forced sexual activity: Not on file  Other Topics Concern  . Not on file  Social History Narrative   Lives alone   caffeine - occas tea     PHYSICAL EXAM   VIDEO EXAM  GENERAL EXAM/CONSTITUTIONAL:  Vitals: There were no vitals  filed for this visit.  There is no height or weight on file to calculate BMI. Wt Readings from Last 3 Encounters:  12/28/17 223 lb 3.2 oz (101.2 kg)  12/13/17 223 lb 3.2 oz (101.2 kg)  12/22/16 226 lb (102.5 kg)     Patient is in no distress; well developed, nourished and groomed; neck is  supple   NEUROLOGIC: MENTAL STATUS:  No flowsheet data found.  awake, alert, oriented to person, place and time  recent and remote memory intact  normal attention and concentration  language fluent, comprehension intact, naming intact  fund of knowledge appropriate  CRANIAL NERVE:   2nd, 3rd, 4th, 6th - visual fields full to confrontation, extraocular muscles intact, no nystagmus  5th - facial sensation symmetric  7th - facial strength symmetric  8th - hearing intact  11th - shoulder shrug symmetric  12th - tongue protrusion midline  MOTOR:   NO TREMOR; NO DRIFT IN BUE  SENSORY:   normal and symmetric to light touch  COORDINATION:   fine finger movements normal     DIAGNOSTIC DATA (LABS, IMAGING, TESTING) - I reviewed patient records, labs, notes, testing and imaging myself where available.  Lab Results  Component Value Date   WBC 10.5 12/28/2017   HGB 11.9 (L) 12/28/2017   HCT 40.0 12/28/2017   MCV 71.2 (L) 12/28/2017   PLT 349 12/28/2017      Component Value Date/Time   NA 137 12/28/2017 1400   K 3.8 12/28/2017 1400   CL 105 12/28/2017 1400   CO2 25 12/28/2017 1400   GLUCOSE 114 (H) 12/28/2017 1400   BUN 19 12/28/2017 1400   CREATININE 1.10 (H) 12/28/2017 1400   CREATININE 1.15 (H) 03/09/2016 1415   CALCIUM 10.3 12/28/2017 1400   PROT 7.4 12/16/2016 0942   ALBUMIN 3.9 12/16/2016 0942   AST 21 12/16/2016 0942   ALT 20 12/16/2016 0942   ALKPHOS 86 12/16/2016 0942   BILITOT 0.4 12/16/2016 0942   GFRNONAA 52 (L) 12/28/2017 1400   GFRNONAA 51 (L) 03/09/2016 1415   GFRAA >60 12/28/2017 1400   GFRAA 59 (L) 03/09/2016 1415   Lab Results  Component  Value Date   CHOL 229 (H) 10/29/2015   HDL 42 (L) 10/29/2015   LDLCALC 158 (H) 10/29/2015   TRIG 146 10/29/2015   CHOLHDL 5.5 (H) 10/29/2015   Lab Results  Component Value Date   HGBA1C 6.9 (H) 12/16/2016   Lab Results  Component Value Date   VITAMINB12 364 11/02/2011   Lab Results  Component Value Date   TSH 2.84 01/15/2015      ASSESSMENT AND PLAN  63 y.o. year old female here with:  Dx:  1. Post-traumatic headache, not intractable, unspecified chronicity pattern   2. Neck pain   3. Low back pain, unspecified back pain laterality, unspecified chronicity, unspecified whether sciatica present     Virtual Visit via Video Note  I connected with Pamela Stein on 06/23/18 at 11:00 AM EDT by a video enabled telemedicine application and verified that I am speaking with the correct person using two identifiers.  Location: Patient: home Provider: office   I discussed the limitations of evaluation and management by telemedicine and the availability of in person appointments. The patient expressed understanding and agreed to proceed.  I discussed the assessment and treatment plan with the patient. The patient was provided an opportunity to ask questions and all were answered. The patient agreed with the plan and demonstrated an understanding of the instructions.   The patient was advised to call back or seek an in-person evaluation if the symptoms worsen or if the condition fails to improve as anticipated.  I provided 25 minutes of non-face-to-face time during this encounter.    PLAN:  POST TRAUMATIC HEADACHE / NECK PAIN / BACK PAIN - continue supportive care - ibuprofen, tylenol,  ice as needed - may consider PT evaluation (for balance evaluation)  Return for return to PCP.    Penni Bombard, MD 6/84/0335, 33:17 AM Certified in Neurology, Neurophysiology and Neuroimaging  [facility] [address] [place], [place] [address]  [phone] [place] my

## 2018-06-27 DIAGNOSIS — E119 Type 2 diabetes mellitus without complications: Secondary | ICD-10-CM | POA: Diagnosis not present

## 2018-06-27 DIAGNOSIS — R82998 Other abnormal findings in urine: Secondary | ICD-10-CM | POA: Diagnosis not present

## 2018-06-27 DIAGNOSIS — R51 Headache: Secondary | ICD-10-CM | POA: Diagnosis not present

## 2018-07-25 DIAGNOSIS — J42 Unspecified chronic bronchitis: Secondary | ICD-10-CM | POA: Diagnosis not present

## 2018-07-25 DIAGNOSIS — W19XXXD Unspecified fall, subsequent encounter: Secondary | ICD-10-CM | POA: Diagnosis not present

## 2018-07-25 DIAGNOSIS — E119 Type 2 diabetes mellitus without complications: Secondary | ICD-10-CM | POA: Diagnosis not present

## 2018-09-16 ENCOUNTER — Other Ambulatory Visit (HOSPITAL_COMMUNITY): Payer: Self-pay | Admitting: Internal Medicine

## 2018-09-16 DIAGNOSIS — Z1231 Encounter for screening mammogram for malignant neoplasm of breast: Secondary | ICD-10-CM

## 2018-09-26 ENCOUNTER — Ambulatory Visit (HOSPITAL_COMMUNITY)
Admission: RE | Admit: 2018-09-26 | Discharge: 2018-09-26 | Disposition: A | Discharge status: Home / Self Care | Payer: Federal, State, Local not specified - PPO | Source: Ambulatory Visit | Attending: Internal Medicine | Admitting: Internal Medicine

## 2018-09-26 ENCOUNTER — Other Ambulatory Visit: Payer: Self-pay

## 2018-09-26 ENCOUNTER — Encounter (HOSPITAL_COMMUNITY): Payer: Self-pay

## 2018-09-26 DIAGNOSIS — Z1231 Encounter for screening mammogram for malignant neoplasm of breast: Secondary | ICD-10-CM | POA: Insufficient documentation

## 2018-09-26 IMAGING — MG DIGITAL SCREENING BILATERAL MAMMOGRAM WITH TOMO AND CAD
6 of 12 series · 6 of 36 positions shown · non-contrast
Comparison: Previous exam(s).

CLINICAL DATA: Screening.

EXAM:
DIGITAL SCREENING BILATERAL MAMMOGRAM WITH TOMO AND CAD

[R MLO synth-2D]
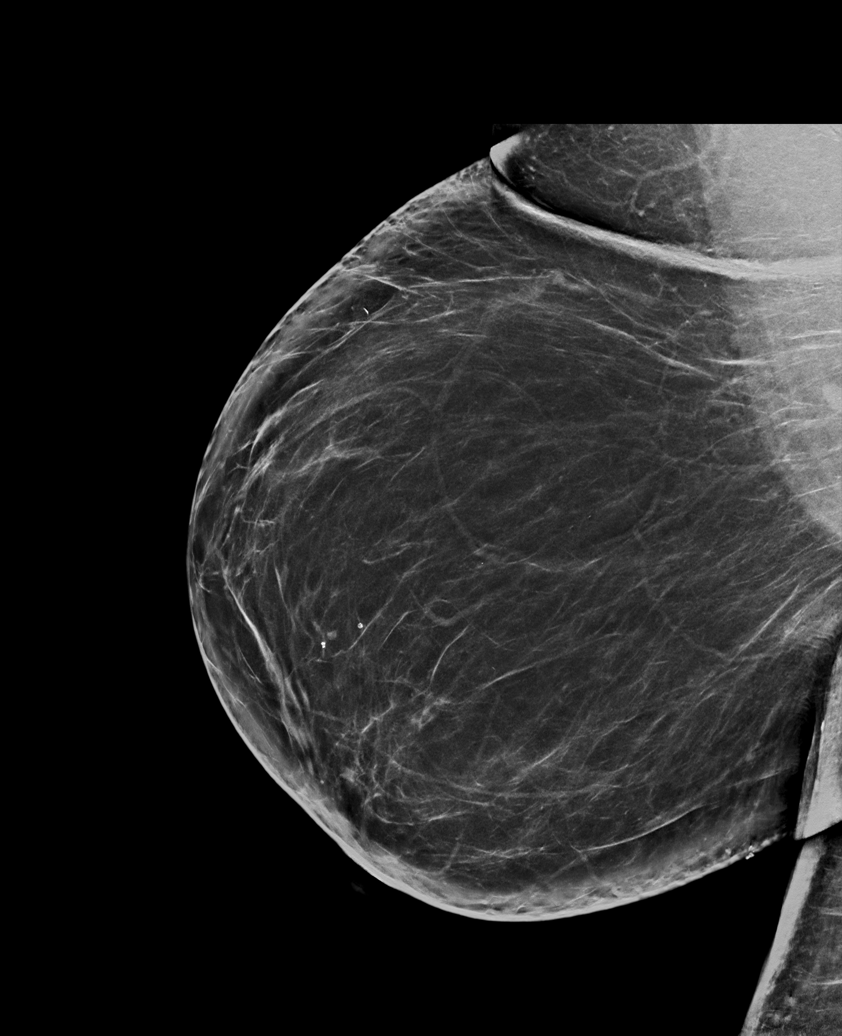

[R CC synth-2D (1 of 2)]
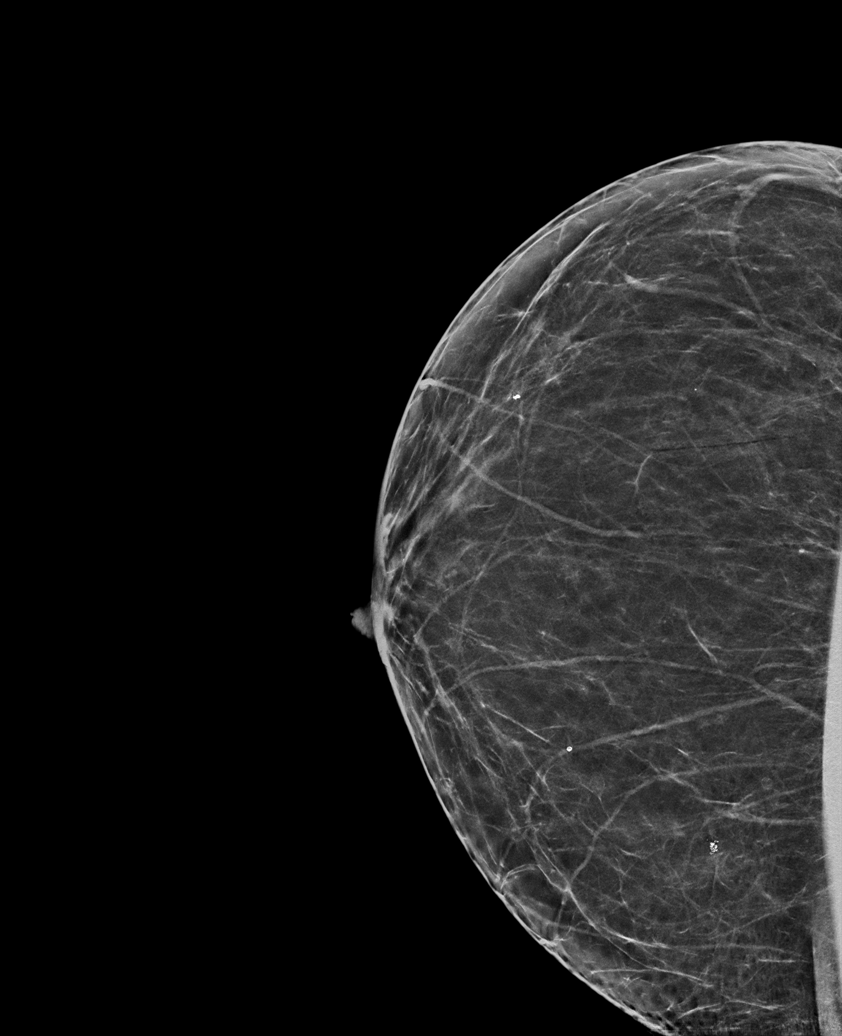

[L CC synth-2D (1 of 2)]
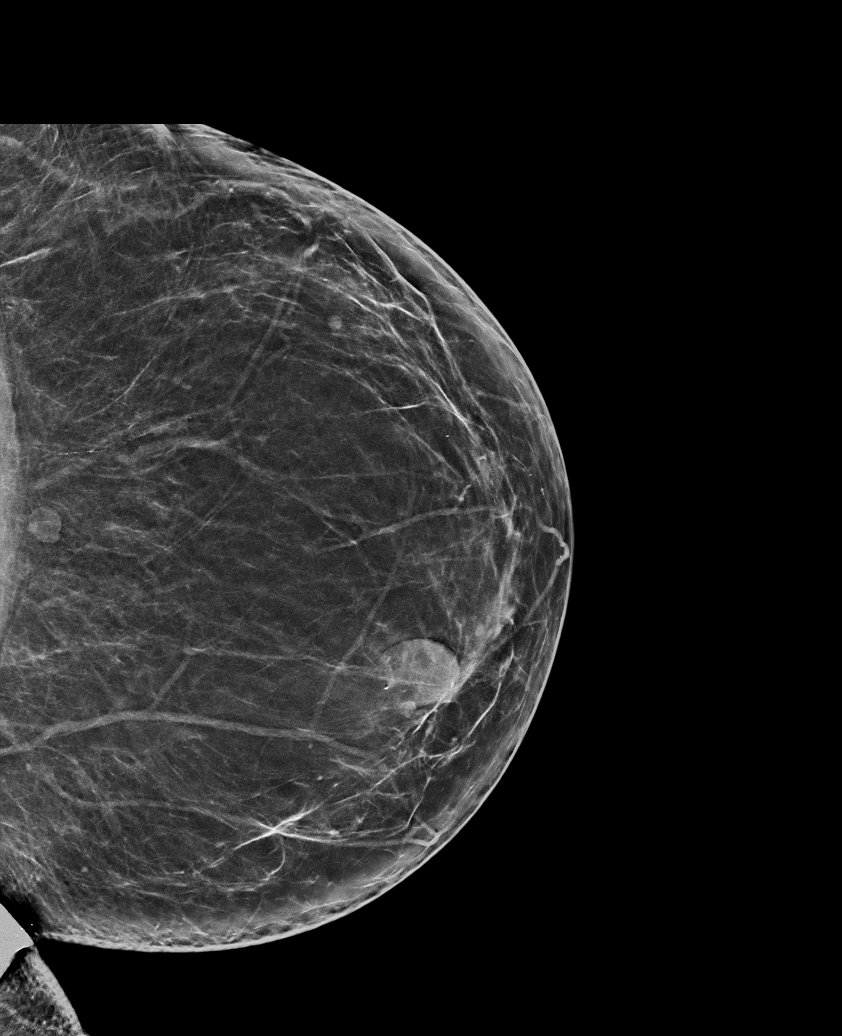

[R CC synth-2D (2 of 2)]
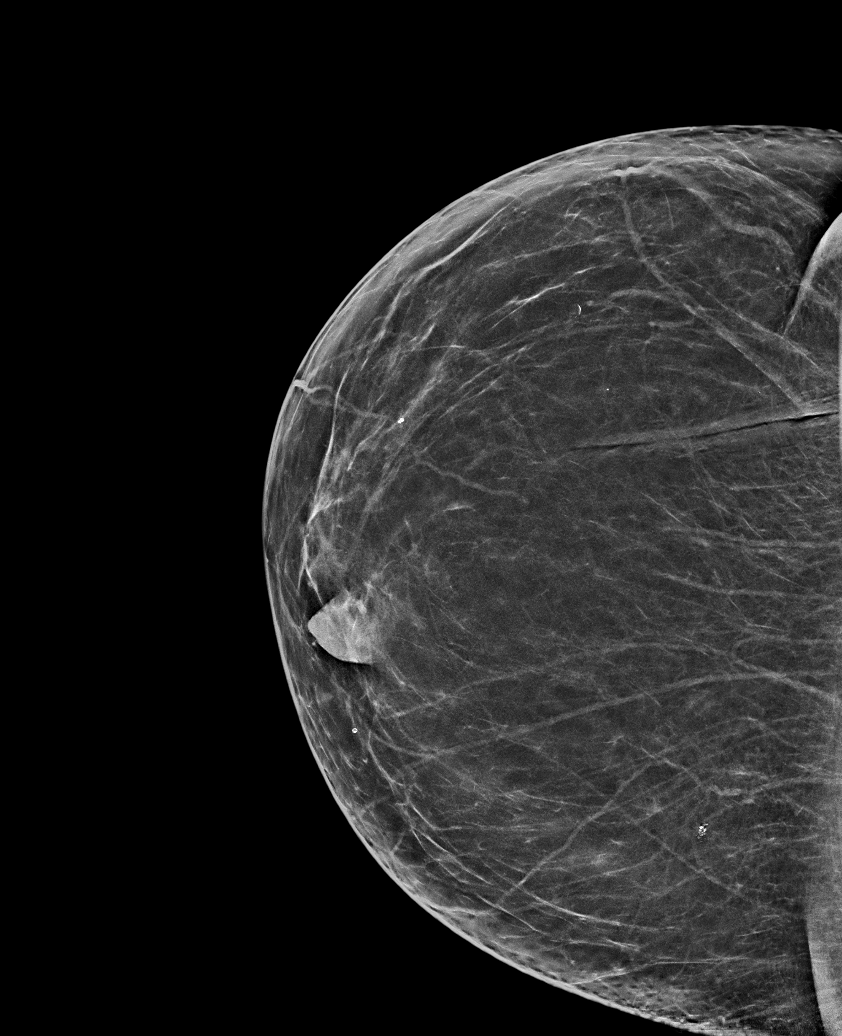

[L MLO synth-2D]
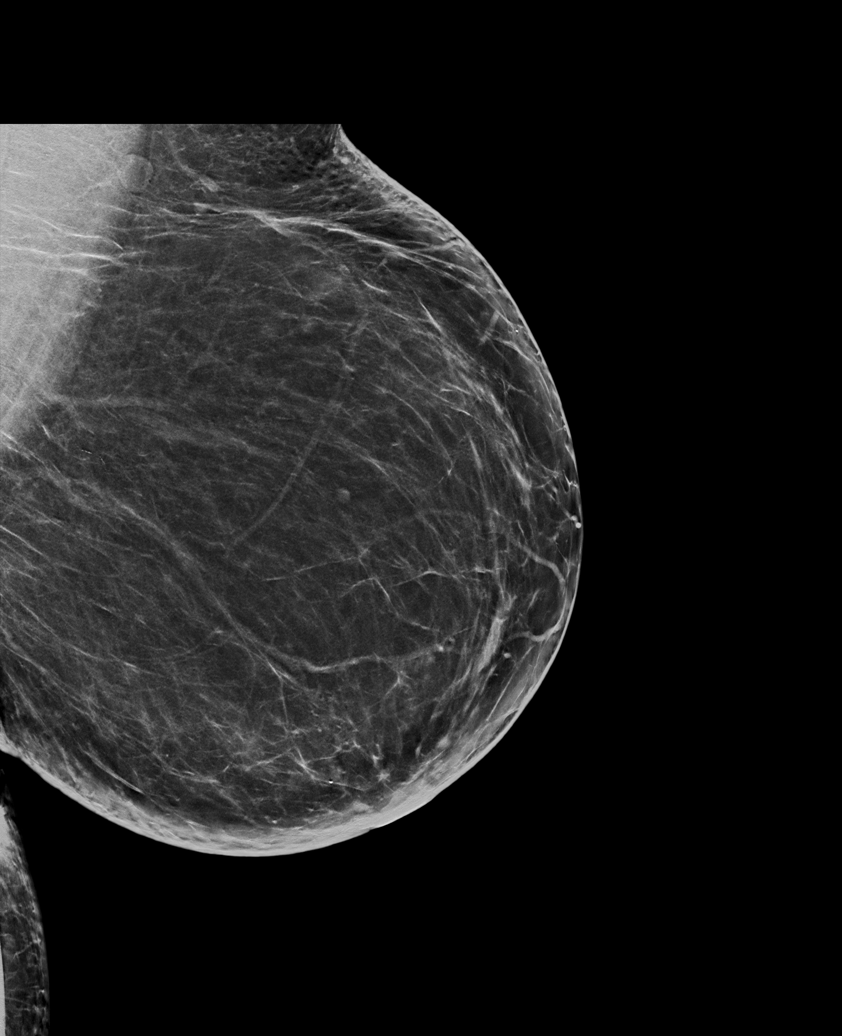

[L CC synth-2D (2 of 2)]
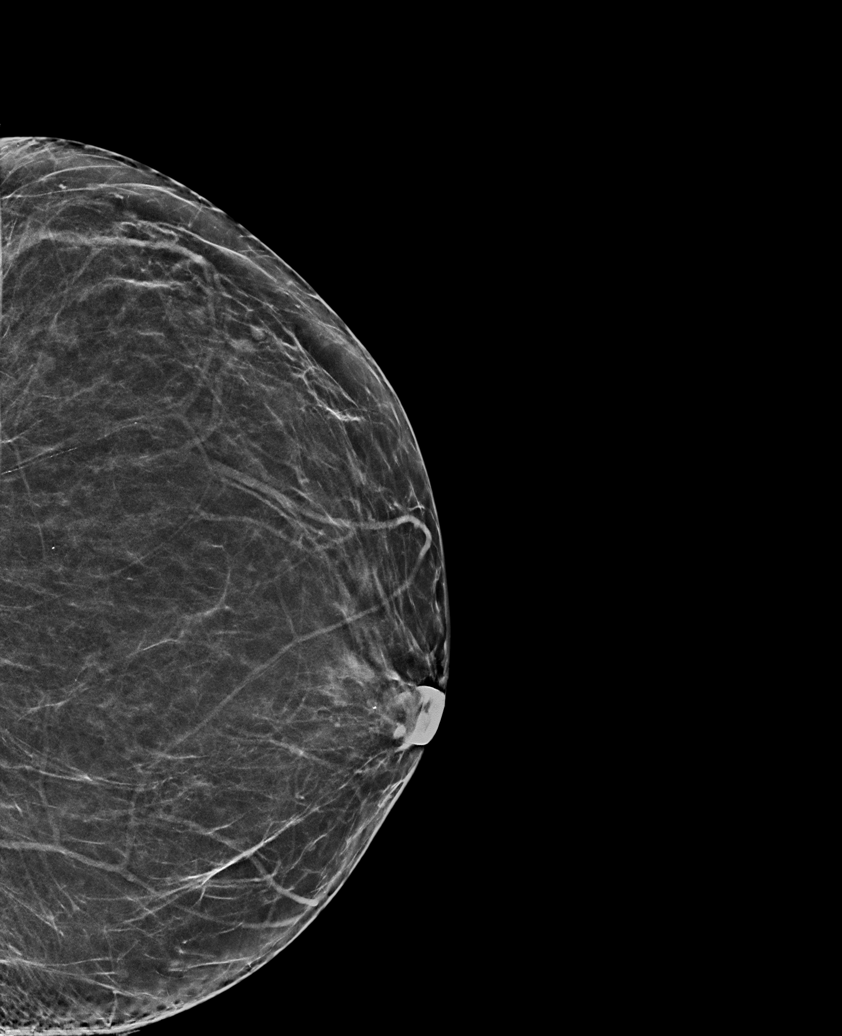

[6 of 36 positions shown; findings below may reference images not displayed]

ACR Breast Density Category b: There are scattered areas of
fibroglandular density.
FINDINGS: There are no findings suspicious for malignancy. Images were
processed with CAD.
IMPRESSION: No mammographic evidence of malignancy. A result letter of this
screening mammogram will be mailed directly to the patient.

RECOMMENDATION:
Screening mammogram in one year. (Code:[TQ])

BI-RADS CATEGORY  1: Negative.

## 2018-11-15 DIAGNOSIS — E119 Type 2 diabetes mellitus without complications: Secondary | ICD-10-CM | POA: Diagnosis not present

## 2018-11-15 DIAGNOSIS — Z23 Encounter for immunization: Secondary | ICD-10-CM | POA: Diagnosis not present

## 2018-11-15 DIAGNOSIS — Z0001 Encounter for general adult medical examination with abnormal findings: Secondary | ICD-10-CM | POA: Diagnosis not present

## 2018-11-15 DIAGNOSIS — I10 Essential (primary) hypertension: Secondary | ICD-10-CM | POA: Diagnosis not present

## 2018-11-15 DIAGNOSIS — J410 Simple chronic bronchitis: Secondary | ICD-10-CM | POA: Diagnosis not present

## 2018-11-16 DIAGNOSIS — I10 Essential (primary) hypertension: Secondary | ICD-10-CM | POA: Diagnosis not present

## 2018-11-16 DIAGNOSIS — E119 Type 2 diabetes mellitus without complications: Secondary | ICD-10-CM | POA: Diagnosis not present

## 2018-11-16 DIAGNOSIS — E7849 Other hyperlipidemia: Secondary | ICD-10-CM | POA: Diagnosis not present

## 2019-01-04 ENCOUNTER — Telehealth: Payer: Self-pay | Admitting: Adult Health

## 2019-01-04 NOTE — Telephone Encounter (Signed)

## 2019-01-09 ENCOUNTER — Ambulatory Visit (INDEPENDENT_AMBULATORY_CARE_PROVIDER_SITE_OTHER): Payer: Federal, State, Local not specified - PPO | Admitting: Adult Health

## 2019-01-09 ENCOUNTER — Encounter: Payer: Self-pay | Admitting: Adult Health

## 2019-01-09 ENCOUNTER — Other Ambulatory Visit: Payer: Self-pay

## 2019-01-09 VITALS — BP 132/73 | HR 62 | Ht 62.5 in | Wt 224.2 lb

## 2019-01-09 DIAGNOSIS — Z1212 Encounter for screening for malignant neoplasm of rectum: Secondary | ICD-10-CM

## 2019-01-09 DIAGNOSIS — Z01419 Encounter for gynecological examination (general) (routine) without abnormal findings: Secondary | ICD-10-CM | POA: Insufficient documentation

## 2019-01-09 DIAGNOSIS — Z1211 Encounter for screening for malignant neoplasm of colon: Secondary | ICD-10-CM | POA: Diagnosis not present

## 2019-01-09 LAB — HEMOCCULT GUIAC POC 1CARD (OFFICE): Fecal Occult Blood, POC: NEGATIVE

## 2019-01-09 NOTE — Progress Notes (Signed)
Patient ID: Pamela Stein, female   DOB: 12-Aug-1955, 63 y.o.   MRN: [id number] History of Present Illness: Pamela Stein is a 63 years old black female, single, sp [facility] in for a well woman gyn exam. She had a normal pap with negative HPV 05/06/16. PCP is Dr Legrand Rams.   Current Medications, Allergies, Past Medical History, Past Surgical History, Family History and Social History were reviewed in [employer] record.     Review of Systems: Patient denies any headaches, hearing loss, fatigue, blurred vision, shortness of breath, chest pain, abdominal pain, problems with bowel movements, urination, or intercourse(not active). No joint pain or mood swings.    Physical Exam:BP 132/73 (BP Location: Left Arm, Patient Position: Sitting, Cuff Size: Large)   Pulse 62   Ht 5' 2.5" (1.588 m)   Wt 224 lb 3.2 oz (101.7 kg)   BMI 40.35 kg/m  General:  Well developed, well nourished, no acute distress Skin:  Warm and dry Neck:  Midline trachea, normal thyroid, good ROM, no lymphadenopathy,no carotid bruits heard  Lungs; Clear to auscultation bilaterally Breast:  No dominant palpable mass, retraction, or nipple discharge Cardiovascular: Regular rate and rhythm Abdomen:  Soft, non tender, no hepatosplenomegaly Pelvic:  External genitalia is normal in appearance, no lesions.  The vagina is pale pink, with loss of moisture and rugae. Urethra has no lesions or masses. The cervix is smooth.  Uterus is absent..  No adnexal masses or tenderness noted.Bladder is non tender, no masses felt. Rectal: Good sphincter tone, no polyps, or hemorrhoids felt.  Hemoccult negative. Extremities/musculoskeletal:  No swelling or varicosities noted, no clubbing or cyanosis Psych:  No mood changes, alert and cooperative,seems happy Fall risk is low PHQ 2 score is 0. Examination chaperoned by Rolena Infante LPN.   Impression and Plan:   1. Encounter for well woman exam with routine gynecological exam Pap  and physical in 1 year Labs with PCP Mammogram in 1 year   2. Screening for colorectal cancer Colonoscopy per GI

## 2019-03-08 DIAGNOSIS — R2689 Other abnormalities of gait and mobility: Secondary | ICD-10-CM | POA: Diagnosis not present

## 2019-03-08 DIAGNOSIS — E119 Type 2 diabetes mellitus without complications: Secondary | ICD-10-CM | POA: Diagnosis not present

## 2019-03-08 DIAGNOSIS — G629 Polyneuropathy, unspecified: Secondary | ICD-10-CM | POA: Diagnosis not present

## 2019-03-10 ENCOUNTER — Ambulatory Visit: Payer: Federal, State, Local not specified - PPO

## 2019-03-10 ENCOUNTER — Other Ambulatory Visit: Payer: Self-pay

## 2019-03-10 ENCOUNTER — Ambulatory Visit: Payer: Federal, State, Local not specified - PPO | Attending: Internal Medicine

## 2019-03-10 DIAGNOSIS — Z20822 Contact with and (suspected) exposure to covid-19: Secondary | ICD-10-CM | POA: Diagnosis not present

## 2019-03-11 LAB — NOVEL CORONAVIRUS, NAA: SARS-CoV-2, NAA: NOT DETECTED

## 2019-03-17 ENCOUNTER — Telehealth: Payer: Self-pay | Admitting: Internal Medicine

## 2019-03-17 NOTE — Telephone Encounter (Signed)
Negative COVID results given. Patient results "NOT Detected." Caller expressed understanding. ° °

## 2019-04-03 DIAGNOSIS — R296 Repeated falls: Secondary | ICD-10-CM | POA: Diagnosis not present

## 2019-04-03 DIAGNOSIS — R2681 Unsteadiness on feet: Secondary | ICD-10-CM | POA: Diagnosis not present

## 2019-04-03 DIAGNOSIS — M5002 Cervical disc disorder with myelopathy, mid-cervical region, unspecified level: Secondary | ICD-10-CM | POA: Diagnosis not present

## 2019-04-03 DIAGNOSIS — G4733 Obstructive sleep apnea (adult) (pediatric): Secondary | ICD-10-CM | POA: Diagnosis not present

## 2019-04-04 ENCOUNTER — Other Ambulatory Visit: Payer: Self-pay | Admitting: Neurology

## 2019-04-04 ENCOUNTER — Other Ambulatory Visit (HOSPITAL_COMMUNITY): Payer: Self-pay | Admitting: Neurology

## 2019-04-04 DIAGNOSIS — R2681 Unsteadiness on feet: Secondary | ICD-10-CM

## 2019-04-04 DIAGNOSIS — R296 Repeated falls: Secondary | ICD-10-CM

## 2019-04-07 DIAGNOSIS — Z20822 Contact with and (suspected) exposure to covid-19: Secondary | ICD-10-CM | POA: Diagnosis not present

## 2019-04-18 ENCOUNTER — Ambulatory Visit (HOSPITAL_COMMUNITY)
Admission: RE | Admit: 2019-04-18 | Discharge: 2019-04-18 | Disposition: A | Discharge status: Home / Self Care | Payer: Federal, State, Local not specified - PPO | Source: Ambulatory Visit | Attending: Neurology | Admitting: Neurology

## 2019-04-18 ENCOUNTER — Other Ambulatory Visit: Payer: Self-pay

## 2019-04-18 DIAGNOSIS — R2681 Unsteadiness on feet: Secondary | ICD-10-CM | POA: Diagnosis not present

## 2019-04-18 DIAGNOSIS — R269 Unspecified abnormalities of gait and mobility: Secondary | ICD-10-CM | POA: Diagnosis not present

## 2019-04-18 DIAGNOSIS — R296 Repeated falls: Secondary | ICD-10-CM | POA: Diagnosis not present

## 2019-04-18 IMAGING — MR MR HEAD W/O CM
10 of 11 series · 41 of 48 positions shown · non-contrast
Comparison: None.

CLINICAL DATA: Gait instability, frequent falls.

EXAM:
MRI HEAD WITHOUT CONTRAST
TECHNIQUE: Multiplanar, multiecho pulse sequences of the brain and surrounding
structures were obtained without intravenous contrast.

[Series 5: T1 · sagittal · 5.0mm · 0.75mm/px · 2 of 24 slices shown (1 of 2)]
[im 1/24]
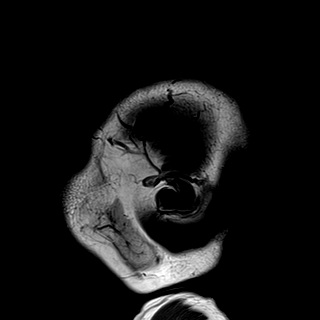
[im 24/24]
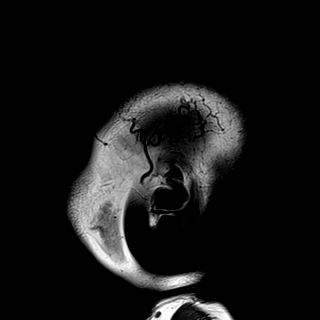

[Series 6: T2 · axial · 5.0mm · 0.62mm/px · z∈[-98,+65]mm · 2 of 26 slices shown]
[im 1/26]
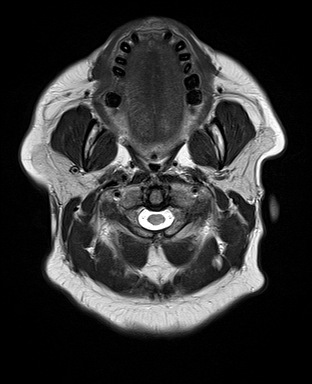
[im 26/26]
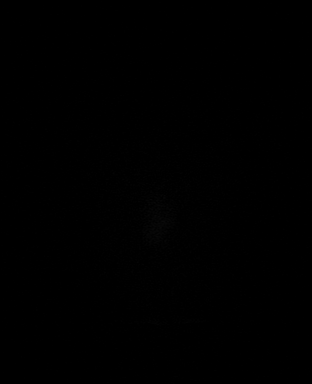

[Series 7: DWI · axial · 3.0mm · 1.36mm/px · z∈[-99,+54]mm · 7 of 104 slices shown (1 of 4)]
[im 1/104]
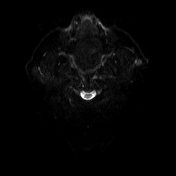
[im 18/104]
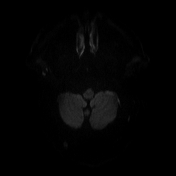
[im 35/104]
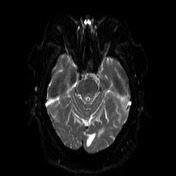
[im 52/104]
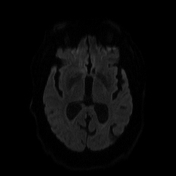
[im 69/104]
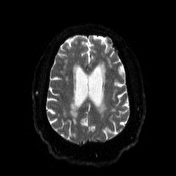
[im 86/104]
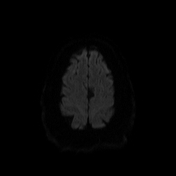
[im 104/104]
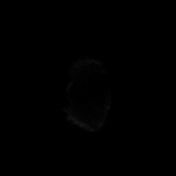

[Series 8: DWI · axial · 3.0mm · 1.36mm/px · z∈[-99,+54]mm · 3 of 52 slices shown (2 of 4)]
[im 1/52]
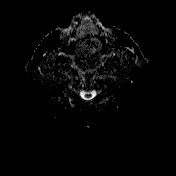
[im 26/52]
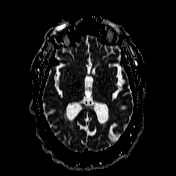
[im 52/52]
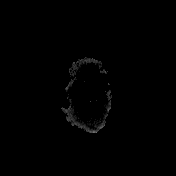

[Series 9: mip_images(sw) · axial · 24.0mm · 0.75mm/px · z∈[-105,+51]mm · 3 of 53 slices shown]
[im 1/53]
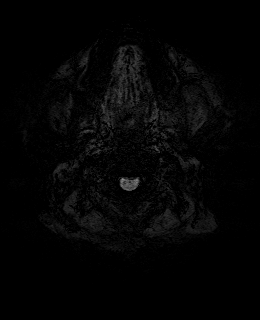
[im 27/53]
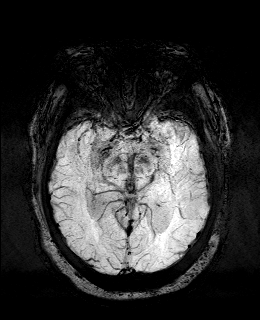
[im 53/53]
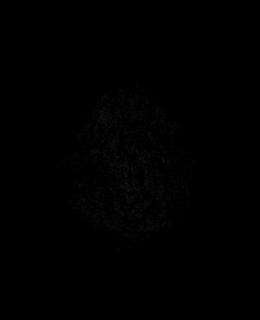

[Series 10: swi_images · axial · 3.0mm · 0.75mm/px · z∈[-115,+62]mm · 4 of 60 slices shown]
[im 1/60]
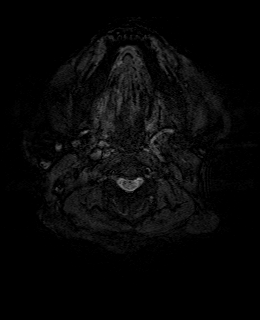
[im 20/60]
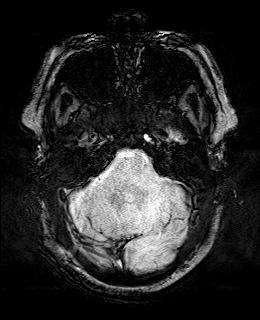
[im 40/60]
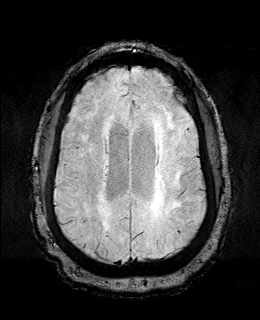
[im 60/60]
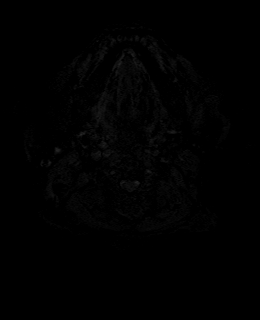

[Series 11: FLAIR · axial · 3.0mm · 0.75mm/px · z∈[-92,+52]mm · 3 of 49 slices shown]
[im 1/49]
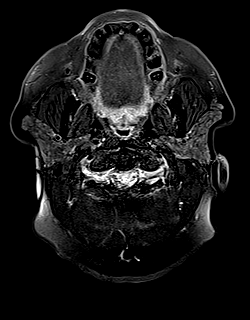
[im 25/49]
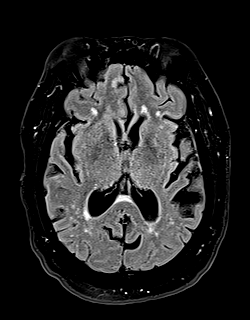
[im 49/49]
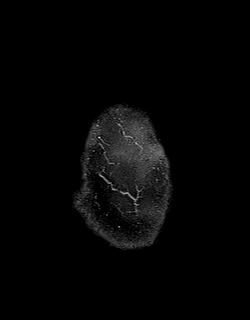

[Series 12: T1 · axial · 1.0mm · 0.94mm/px · z∈[-100,+59]mm · 10 of 160 slices shown (2 of 2)]
[im 1/160]
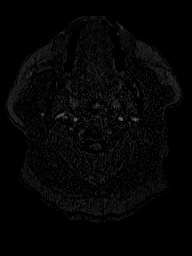
[im 18/160]
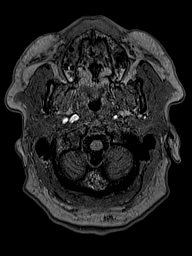
[im 36/160]
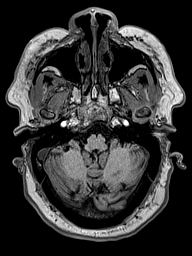
[im 54/160]
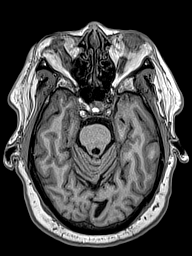
[im 71/160]
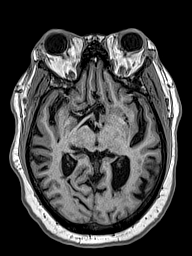
[im 89/160]
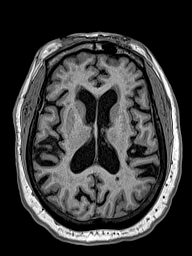
[im 107/160]
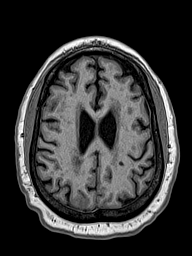
[im 124/160]
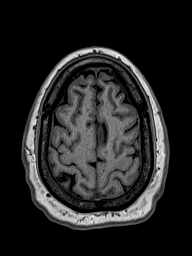
[im 142/160]
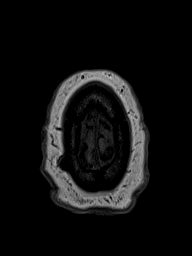
[im 160/160]
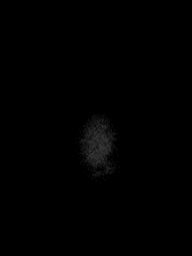

[Series 13: DWI · coronal · 5.0mm · 1.31mm/px · 5 of 72 slices shown (3 of 4)]
[im 1/72]
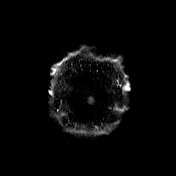
[im 18/72]
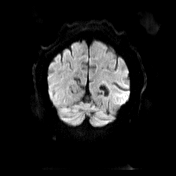
[im 36/72]
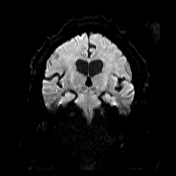
[im 54/72]
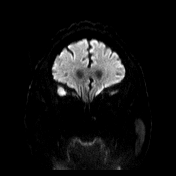
[im 72/72]
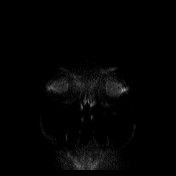

[Series 14: DWI · coronal · 5.0mm · 1.31mm/px · 2 of 36 slices shown (4 of 4)]
[im 1/36]
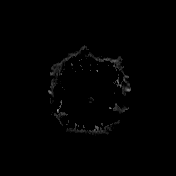
[im 36/36]
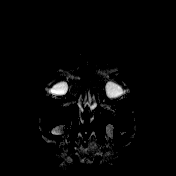

[41 of 48 positions shown; findings below may reference images not displayed]

FINDINGS: Brain: No acute infarction, hemorrhage, hydrocephalus, extra-axial
collection or mass lesion.

Scattered and confluent foci of T2 hyperintense are seen in the
white matter of the cerebral hemispheres, nonspecific. Remote
lacunar infarct is seen in the left cerebellar hemisphere.

There is prominence of the ventricular system and cerebral sulci
reflecting parenchymal volume loss.

Vascular: Normal flow voids.

Skull and upper cervical spine: Normal marrow signal.

Sinuses/Orbits: Bilateral lens surgery. The paranasal sinuses are
clear

Other: T2 hyperintense pituitary lesion, better seen on MRI of the
cervical spine, likely representing a Rathke's cleft cyst.
IMPRESSION: 1. No acute intracranial abnormality.
2. Moderate cerebral white matter changes, nonspecific but likely
related to chronic small vessel ischemic changes. Remote lacunar
infarct in the left cerebellar hemisphere.
3. Mild parenchymal volume loss.
4. T2 hyperintense pituitary lesion, better seen on MRI of the
cervical spine, likely representing a Rathke's cleft cyst.

## 2019-04-18 IMAGING — MR MR CERVICAL SPINE W/O CM
6 of 7 series · 28 of 48 positions shown · non-contrast
Comparison: CT of the cervical spine [DATE]

CLINICAL DATA: Gait instability, frequent falls.

EXAM:
MRI CERVICAL SPINE WITHOUT CONTRAST
TECHNIQUE: Multiplanar, multisequence MR imaging of the cervical spine was
performed. No intravenous contrast was administered.

[Series 9: T1 · sagittal · 3.0mm · 0.69mm/px · 4 of 15 slices shown (1 of 2)]
[im 1/15]
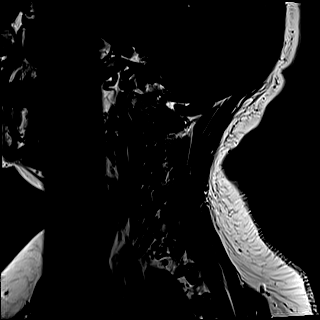
[im 5/15]
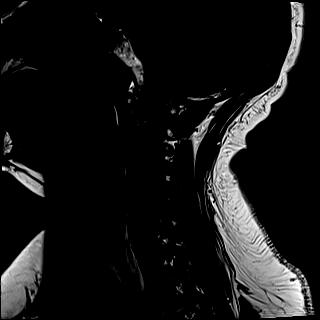
[im 10/15]
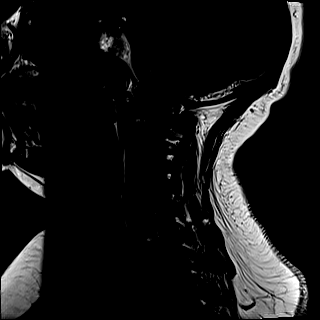
[im 15/15]
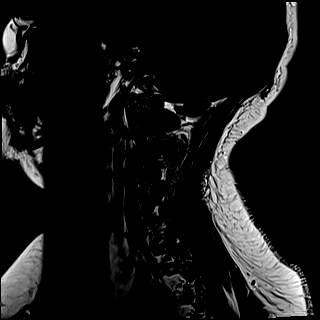

[Series 10: T2 · sagittal · 3.0mm · 0.69mm/px · 3 of 15 slices shown (1 of 2)]
[im 1/15]
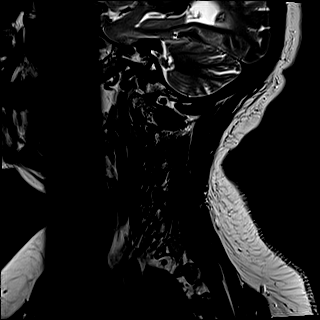
[im 8/15]
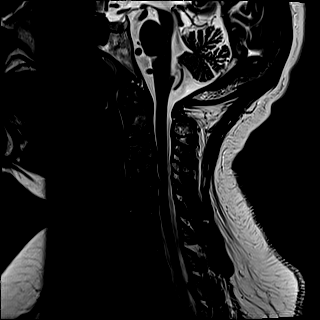
[im 15/15]
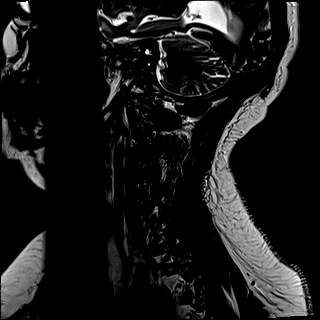

[Series 11: STIR · sagittal · 3.0mm · 0.86mm/px · 3 of 15 slices shown]
[im 1/15]
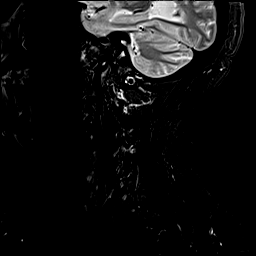
[im 8/15]
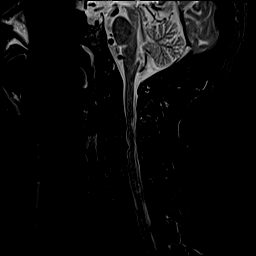
[im 15/15]
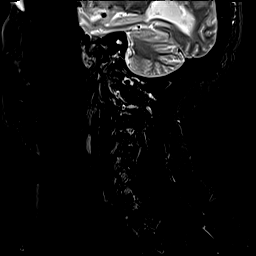

[Series 12: T2 · axial · 3.0mm · 0.70mm/px · z∈[-228,-121]mm · 6 of 32 slices shown (2 of 2)]
[im 1/32]
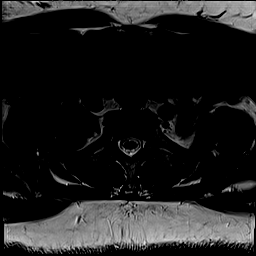
[im 7/32]
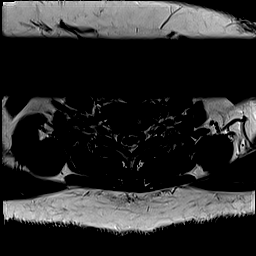
[im 13/32]
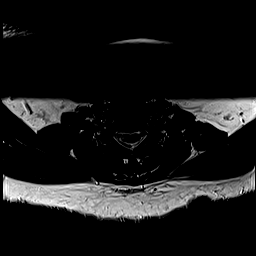
[im 19/32]
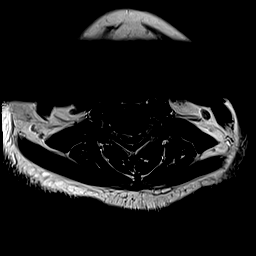
[im 25/32]
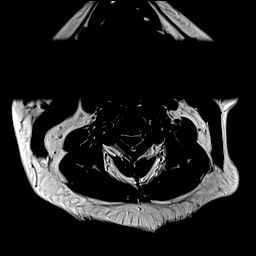
[im 32/32]
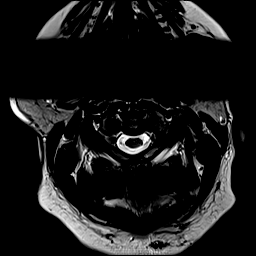

[Series 13: GRE · axial · 3.0mm · 0.35mm/px · z∈[-228,-121]mm · 6 of 32 slices shown]
[im 1/32]
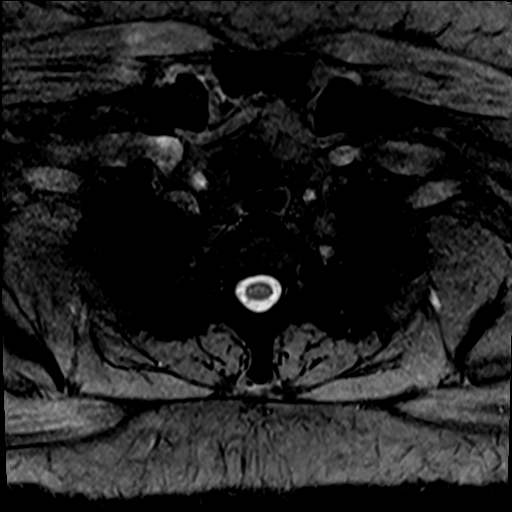
[im 7/32]
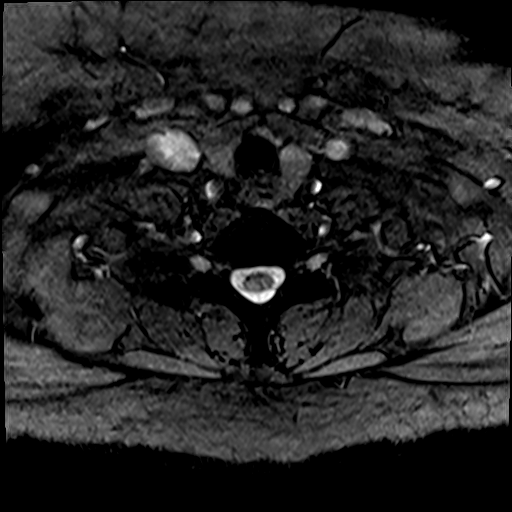
[im 13/32]
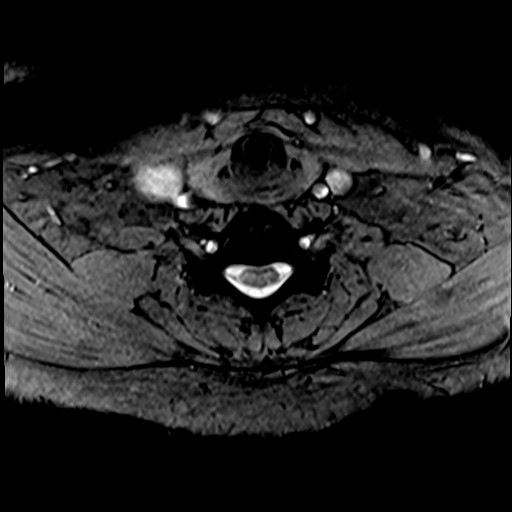
[im 19/32]
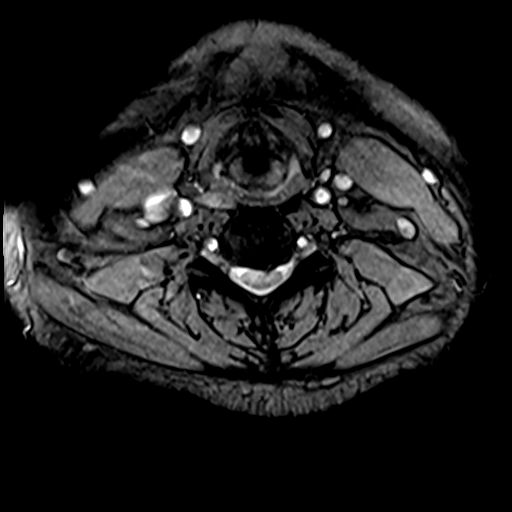
[im 25/32]
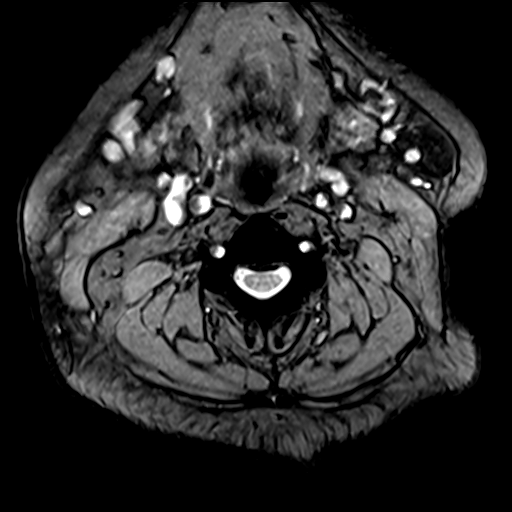
[im 32/32]
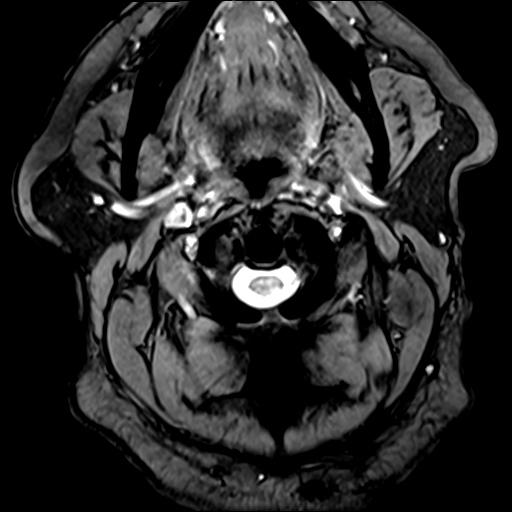

[Series 14: T1 · axial · 3.0mm · 0.35mm/px · z∈[-228,-121]mm · 6 of 32 slices shown (2 of 2)]
[im 1/32]
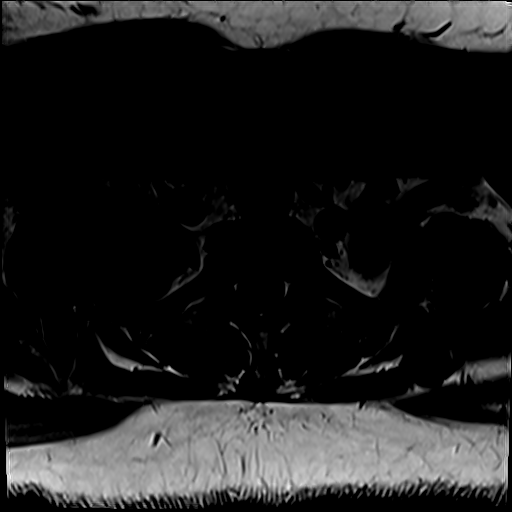
[im 7/32]
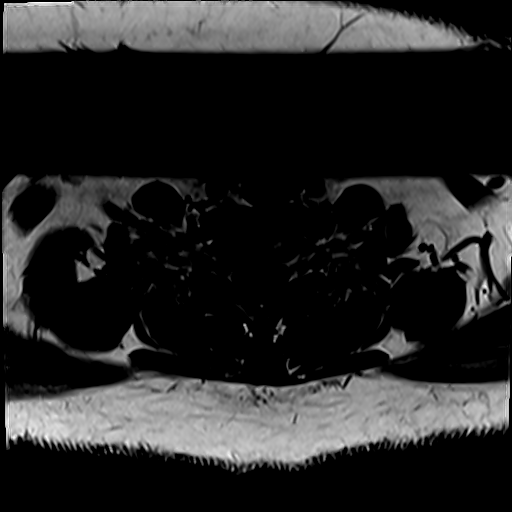
[im 13/32]
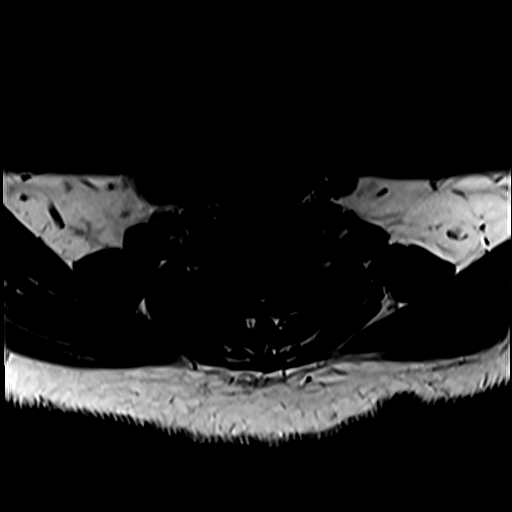
[im 19/32]
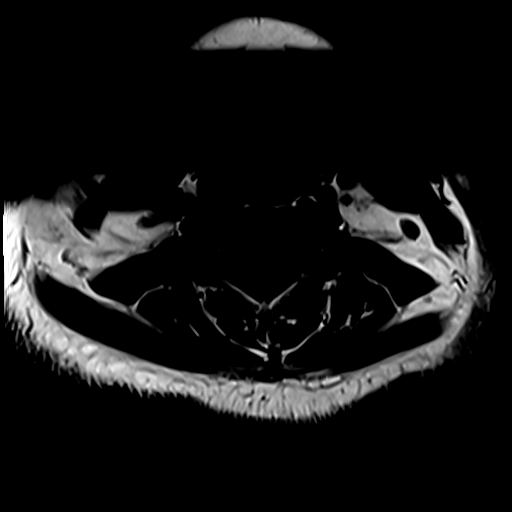
[im 25/32]
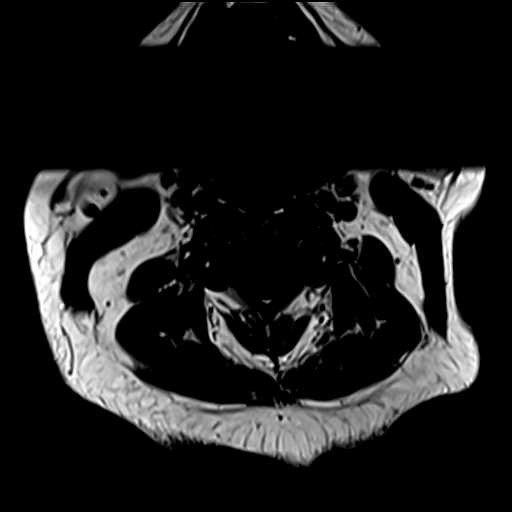
[im 32/32]
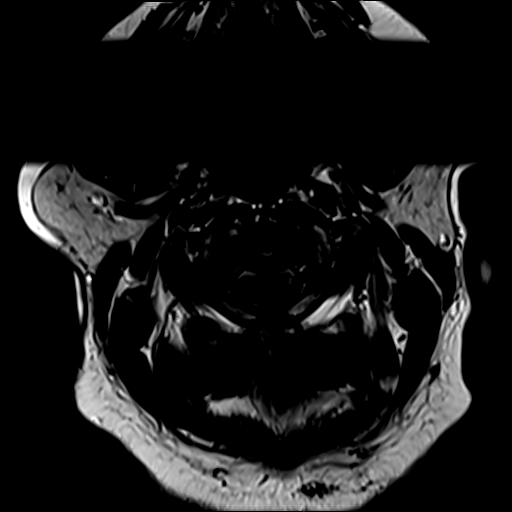

[28 of 48 positions shown; findings below may reference images not displayed]

FINDINGS: Alignment: Straightening of the cervical curvature.

Vertebrae: No fracture, evidence of discitis, or bone lesion.
Failure of fusion of the posterior arch of C1.

Cord: Flattening of the anterior surface of the spinal cord at C3-4,
C4-5 and C5-6.

Posterior Fossa, vertebral arteries, paraspinal tissues: 4 mm T1
hyperintense, T2 hypointense structure within the pituitary gland,
just posterior to the pituitary stalk, likely representing a
Rathke's cleft cyst.

Disc levels:

C2-3: No spinal canal or neural foraminal stenosis.

C3-4: Left asymmetric posterior disc protrusion causing flattening
of the anterior surface of the spinal cord and resulting in mild
spinal canal stenosis. There are uncovertebral and facet
degenerative changes resulting in mild left neural foraminal
narrowing.

C4-5: Left central posterior disc protrusion causing flattening on
the anterior surface of the spinal cord and resulting mild spinal
canal stenosis. No significant neural foraminal narrowing.

C5-6: Posterior disc protrusion causing flattening of the anterior
surface of the spinal cord without significant spinal canal or
neural foraminal stenosis.

C6-7: Tiny posterior disc protrusion. No spinal canal or neural
foraminal stenosis.

C7-T1: No spinal canal or neural foraminal stenosis.
IMPRESSION: 1. Multilevel degenerative changes of the cervical spine with
posterior disc protrusions at C3-4 through C5-6 causing flattening
of the anterior surface of the spinal cord and resulting in mild
spinal canal stenosis at C3-4 and C4-5. No spinal cord signal
abnormality.
2. No high-grade neural foraminal narrowing.
3. A 4 mm T1 hyperintense, T2 hypointense structure within the
pituitary gland, just posterior to the pituitary stalk, likely
representing a Rathke's cleft cyst.

## 2019-04-26 ENCOUNTER — Other Ambulatory Visit (HOSPITAL_COMMUNITY): Payer: Federal, State, Local not specified - PPO

## 2019-05-08 DIAGNOSIS — Z23 Encounter for immunization: Secondary | ICD-10-CM | POA: Diagnosis not present

## 2019-05-25 DIAGNOSIS — R2681 Unsteadiness on feet: Secondary | ICD-10-CM | POA: Insufficient documentation

## 2019-05-25 DIAGNOSIS — M5090 Cervical disc disorder, unspecified, unspecified cervical region: Secondary | ICD-10-CM | POA: Insufficient documentation

## 2019-05-25 DIAGNOSIS — W19XXXA Unspecified fall, initial encounter: Secondary | ICD-10-CM | POA: Insufficient documentation

## 2019-05-25 DIAGNOSIS — M47818 Spondylosis without myelopathy or radiculopathy, sacral and sacrococcygeal region: Secondary | ICD-10-CM | POA: Insufficient documentation

## 2019-05-25 DIAGNOSIS — E1342 Other specified diabetes mellitus with diabetic polyneuropathy: Secondary | ICD-10-CM | POA: Insufficient documentation

## 2019-05-30 DIAGNOSIS — E1142 Type 2 diabetes mellitus with diabetic polyneuropathy: Secondary | ICD-10-CM | POA: Diagnosis not present

## 2019-05-30 DIAGNOSIS — R296 Repeated falls: Secondary | ICD-10-CM | POA: Diagnosis not present

## 2019-05-30 DIAGNOSIS — G4733 Obstructive sleep apnea (adult) (pediatric): Secondary | ICD-10-CM | POA: Diagnosis not present

## 2019-05-30 DIAGNOSIS — G629 Polyneuropathy, unspecified: Secondary | ICD-10-CM | POA: Diagnosis not present

## 2019-06-02 DIAGNOSIS — Z23 Encounter for immunization: Secondary | ICD-10-CM | POA: Diagnosis not present

## 2019-06-05 ENCOUNTER — Other Ambulatory Visit: Payer: Self-pay

## 2019-06-05 ENCOUNTER — Encounter (HOSPITAL_COMMUNITY): Payer: Self-pay | Admitting: Physical Therapy

## 2019-06-05 ENCOUNTER — Ambulatory Visit (HOSPITAL_COMMUNITY): Payer: Federal, State, Local not specified - PPO | Attending: Neurology | Admitting: Physical Therapy

## 2019-06-05 DIAGNOSIS — R2689 Other abnormalities of gait and mobility: Secondary | ICD-10-CM

## 2019-06-05 DIAGNOSIS — Z9181 History of falling: Secondary | ICD-10-CM | POA: Diagnosis not present

## 2019-06-05 DIAGNOSIS — M6281 Muscle weakness (generalized): Secondary | ICD-10-CM

## 2019-06-05 NOTE — Therapy (Signed)
[place], [name], [address] Phone: [phone]   Fax:  [phone]  Physical Therapy Evaluation  Patient Details  Name: Pamela Stein MRN: [id number] Date of Birth: [birth year] Referring Provider (PT): Phillips Odor   Encounter Date: 06/05/2019  PT End of Session - 06/05/19 1437    Visit Number  1    Number of Visits  12    Date for PT Re-Evaluation  07/18/19    Authorization - Visit Number  1    Authorization - Number of Visits  50    Progress Note Due on Visit  10    PT Start Time  1210    PT Stop Time  1300    PT Time Calculation (min)  50 min    Activity Tolerance  Patient tolerated treatment well    Behavior During Therapy  [facility] for tasks assessed/performed       Past Medical History:  Diagnosis Date  . Anxiety   . Back pain   . Bronchitis   . Complication of anesthesia    stopped breathing after endometrial ablation procedure prior to her being diagn. with OSA  . Diabetes mellitus   . Dizziness   . Heart murmur   . History of gout   . Hyperlipidemia   . Hypertension   . Neuropathy   . Obesity   . Obstructive sleep apnea    CPAP    Past Surgical History:  Procedure Laterality Date  . ABDOMINAL HYSTERECTOMY    . ADENOIDECTOMY    . CATARACT EXTRACTION Left    right 02/2018  . COLONOSCOPY  2008   diminutive rectal polyp, s/p removal. polypoid mucosa  . COLONOSCOPY WITH PROPOFOL N/A 01/03/2018   Procedure: COLONOSCOPY WITH PROPOFOL;  Surgeon: Daneil Dolin, MD;  Location: [facility];  Service: Endoscopy;  Laterality: N/A;  12:00pm  . DILATION AND CURETTAGE OF UTERUS    . ENDOMETRIAL ABLATION    . EYE SURGERY Left 12/04/2013   cataract  . PARATHYROIDECTOMY N/A 12/22/2016   Procedure: PARATHYROIDECTOMY, NECK EXPLORATION;  Surgeon: Jackolyn Confer, MD;  Location: [facility] ORS;  Service: General;  Laterality: N/A;  . TONSILLECTOMY      There were no vitals filed for this  visit.   Subjective Assessment - 06/05/19 1217    Subjective  Ms. Otteson states that she started noticing that she was drifting to the Lt side and occasionally falling.  She has occasional Lt knee pain which she uses aspercreme for.  She had a MRI of her cervical spine which shows C3-4 posterior disc  flattening of the spinal cord; C4-5 posterior disc with flattening of the spinal cord, however, the MD states that at this time she does not have to have surgery.    Pertinent History  Back pain, Lt knee pain, HTN    Limitations  Standing;Lifting;Walking;House hold activities    How long can you sit comfortably?  no problem    How long can you stand comfortably?  Able to stand at least 15 minutes she really doesn' stand much    How long can you walk comfortably?  Pt does not walk for exercise.  Able to complete grocery shoppping but is fatigued after    Diagnostic tests  IMPRESSION:1. Multilevel degenerative changes of the cervical spine withposterior disc protrusions at C3-4 through C5-6 causing flatteningof the anterior surface of the spinal cord and resulting in mildspinal canal stenosis at C3-4 and C4-5. No spinal  cord signalabnormality.2. No high-grade neural foraminal narrowing.3. A 4 mm T1 hyperintense, T2 hypointense structure within thepituitary gland, just posterior to the pituitary stalk, likelyrepresenting a Rathke's cleft cyst.    Patient Stated Goals  To walk better, stop falling and less knee pain, be safer going up and down steps    Currently in Pain?  No/denies         [facility] PT Assessment - 06/05/19 0001      Assessment   Medical Diagnosis  History of falling     Referring Provider (PT)  Kofi [place]ah    Onset Date/Surgical Date  05/10/17    Next MD Visit  07/20/2019    Prior Therapy  none      Precautions   Precautions  Fall      Restrictions   Weight Bearing Restrictions  No      Balance Screen   Has the patient fallen in the past 6 months  Yes    How many times?  5     Has the patient had a decrease in activity level because of a fear of falling?   Yes    Is the patient reluctant to leave their home because of a fear of falling?   No      Home Environment   Living Environment  Private residence    Home Access  Level entry      Prior Function   Level of Independence  Independent    Vocation  Full time employment    Vocation Requirements  sit/stand at post office     Leisure  singing       Cognition   Overall Cognitive Status  Within Functional Limits for tasks assessed      Functional Tests   Functional tests  Single leg stance;Sit to Stand      Single Leg Stance   Comments  Rt:  10" , Lt:  4 "      Sit to Stand   Comments  6 in 30 seconds       ROM / Strength   AROM / PROM / Strength  Strength   Lt knee extension 10 degrees      Strength   Strength Assessment Site  Hip;Knee;Ankle    Right/Left Hip  Right;Left    Right Hip Flexion  4+/5    Right Hip Extension  5/5    Right Hip ABduction  5/5    Left Hip Flexion  5/5    Left Hip Extension  5/5    Left Hip ABduction  4/5    Right/Left Knee  Right;Left    Right Knee Flexion  5/5    Right Knee Extension  5/5    Left Knee Flexion  4/5    Left Knee Extension  4/5    Right/Left Ankle  Right;Left    Right Ankle Dorsiflexion  5/5    Right Ankle Plantar Flexion  5/5    Left Ankle Dorsiflexion  5/5    Left Ankle Plantar Flexion  4/5      Functional Gait  Assessment   Gait assessed   --                Objective measurements completed on examination: See above findings.      [place] Adult PT Treatment/Exercise - 06/05/19 0001      Ambulation/Gait   Ambulation Distance (Feet)  276 Feet   in 3' with decreased ankle dorsi and plantarflexion    Stairs  No   pt states she goes up and down sideways she fears steps    Gait Comments  abnormal gait      Posture/Postural Control   Posture/Postural Control  Postural limitations    Postural Limitations  Rounded  Shoulders;Increased thoracic kyphosis      Exercises   Exercises  Neck;Lumbar;Knee/Hip;Ankle      Neck Exercises: Seated   Neck Retraction  5 reps    Neck Retraction Limitations  with scapular retraction       Lumbar Exercises: Seated   Long Arc Quad on Chair  Left;10 reps      Lumbar Exercises: Supine   Other Supine Lumbar Exercises  Lt quad set to improve extension/terminal strength x 5                PT Short Term Goals - 06/05/19 1454      PT SHORT TERM GOAL #1   Title  Pt to state that Lt knee pain is no greater than 2/10 to allow pt to ambulate for 25 mintues without difficulty to allow shopping without difficulty    Time  3    Period  Weeks    Status  New    [place] Date  06/26/19      PT SHORT TERM GOAL #2   Title  Pt to be able to single leg stance for 25" on both LE to reduce risk of falling    Time  3    Period  Weeks    Status  New      PT SHORT TERM GOAL #3   Title  PT LE and core strength to have increased to allow pt to be able to come sit to stand with ease and be able to complete 8 sit to stand in a 30 second period    Time  3    Period  Weeks    Status  New      PT SHORT TERM GOAL #4   Title  Pt Lt knee extension to be 5 degees or less to allow a more normalized gait pattern    Time  3    Period  Weeks    Status  New        PT Long Term Goals - 06/05/19 1459      PT LONG TERM GOAL #1   Title  PT to be able to single leg stance for 45" on both LE in order to have no falls in the past three weeks.    Time  6    Period  Weeks    Status  New    [place] Date  07/17/19      PT LONG TERM GOAL #2   Title  Pt  LE and core strength to increase to allow pt to be able to ascend and descend 8 steps in a reciprocal manner with one handrail without difficulty.    Time  6    Period  Weeks    Status  New      PT LONG TERM GOAL #3   Title  Pt Lt knee extension to be 3 or less and gait to be normalized by increasing ankle and knee flexion while  ambulating    Time  6    Period  Weeks    Status  New      PT LONG TERM GOAL #4   Title  PT to be able to walk for 60 mintues or more without difficulty  to allow pt to shop without hesitation.    Time  6    Period  Weeks    Status  New             Plan - 06/05/19 1438    Clinical Impression Statement  Ms. Roese is a 64 yo female who has been referred to skilled therapy for a history of falling and abnormal gait.  Evaluation demonstrates abnormal cervical MRI, has complaints of Lt knee pain, decreased activity level, decreased balance, decreased strength and Hx of progressive falling.    Personal Factors and Comorbidities  Comorbidity 1;Fitness;Past/Current Experience    Comorbidities  see MRI    Examination-Activity Limitations  Carry;Lift;Locomotion Level;Stairs;Stand    Examination-Participation Restrictions  Cleaning;Community Activity;Laundry;Shop    Stability/Clinical Decision Making  Evolving/Moderate complexity    Clinical Decision Making  Moderate    Rehab Potential  Good    PT Frequency  2x / week    PT Duration  6 weeks    PT Treatment/Interventions  ADLs/Self Care Home Management;Therapeutic exercise;Therapeutic activities;Balance training;Neuromuscular re-education;Patient/family education;Manual techniques;Gait training;Stair training;Functional mobility training;Aquatic Therapy    PT Next Visit Plan  Begin heel raises, functional squats Single leg stance, sode stepping cervical extension and manual progress balance and strengthening exercises as able    PT Home Exercise Plan  scapular and cervical retraction, Lt LAQ, Lt Quad set, bridge.    Recommended Other Services  Based on todays findings discussed possible benefits of neurosurgical consult if therapy does not improve sx.    Consulted and Agree with Plan of Care  Patient       Patient will benefit from skilled therapeutic intervention in order to improve the following deficits and impairments:  Abnormal gait,  Decreased activity tolerance, Decreased balance, Decreased coordination, Decreased range of motion, Decreased strength, Difficulty walking, Pain  Visit Diagnosis: History of falling - Plan: PT plan of care cert/re-cert  Muscle weakness (generalized) - Plan: PT plan of care cert/re-cert  Other abnormalities of gait and mobility - Plan: PT plan of care cert/re-cert     Problem List Patient Active Problem List   Diagnosis Date Noted  . Encounter for well woman exam with routine gynecological exam 01/09/2019  . Screening for colorectal cancer 01/09/2019  . Encounter for screening colonoscopy 12/13/2017  . Hyperparathyroidism, primary ([place]) 12/22/2016  . Vitamin D deficiency 10/31/2015  . Hyperuricemia 05/08/2013  . ONYCHOMYCOSIS, TOENAILS 02/22/2009  . NEVI, MULTIPLE 02/22/2009  . Diabetes mellitus ([place]) 01/25/2008  . BACK PAIN WITH RADICULOPATHY 11/09/2007  . Hyperlipidemia LDL goal <100 08/26/2007  . Morbid obesity ([place]) 08/26/2007  . Essential hypertension 08/26/2007  . Obstructive sleep apnea 04/20/2007   Rayetta Humphrey, PT CLT [phone] 06/05/2019, 3:06 PM  [place] [address] [place], [name], [address] Phone: [phone]   Fax:  [phone]  Name: CHANTERIA LOERA MRN: [id number] Date of Birth: 1955/08/08

## 2019-06-07 ENCOUNTER — Other Ambulatory Visit: Payer: Self-pay

## 2019-06-07 ENCOUNTER — Ambulatory Visit (HOSPITAL_COMMUNITY): Payer: Federal, State, Local not specified - PPO | Admitting: Physical Therapy

## 2019-06-07 DIAGNOSIS — M6281 Muscle weakness (generalized): Secondary | ICD-10-CM

## 2019-06-07 DIAGNOSIS — Z9181 History of falling: Secondary | ICD-10-CM

## 2019-06-07 DIAGNOSIS — R2689 Other abnormalities of gait and mobility: Secondary | ICD-10-CM

## 2019-06-07 NOTE — Therapy (Signed)
[place], [name], [address] Phone: [phone]   Fax:  [phone]  Physical Therapy Treatment  Patient Details  Name: Pamela Stein MRN: [id number] Date of Birth: 1955-07-29 Referring Provider (PT): Phillips Odor   Encounter Date: 06/07/2019  PT End of Session - 06/07/19 0933    Visit Number  2    Number of Visits  12    Date for PT Re-Evaluation  07/18/19    Authorization - Visit Number  2    Authorization - Number of Visits  50    Progress Note Due on Visit  10    PT Start Time  0910    PT Stop Time  0950    PT Time Calculation (min)  40 min    Activity Tolerance  Patient tolerated treatment well    Behavior During Therapy  [facility] for tasks assessed/performed       Past Medical History:  Diagnosis Date  . Anxiety   . Back pain   . Bronchitis   . Complication of anesthesia    stopped breathing after endometrial ablation procedure prior to her being diagn. with OSA  . Diabetes mellitus   . Dizziness   . Heart murmur   . History of gout   . Hyperlipidemia   . Hypertension   . Neuropathy   . Obesity   . Obstructive sleep apnea    CPAP    Past Surgical History:  Procedure Laterality Date  . ABDOMINAL HYSTERECTOMY    . ADENOIDECTOMY    . CATARACT EXTRACTION Left    right 02/2018  . COLONOSCOPY  2008   diminutive rectal polyp, s/p removal. polypoid mucosa  . COLONOSCOPY WITH PROPOFOL N/A 01/03/2018   Procedure: COLONOSCOPY WITH PROPOFOL;  Surgeon: Daneil Dolin, MD;  [facility];  Service: Endoscopy;  Laterality: N/A;  12:00pm  . DILATION AND CURETTAGE OF UTERUS    . ENDOMETRIAL ABLATION    . EYE SURGERY Left 12/04/2013   cataract  . PARATHYROIDECTOMY N/A 12/22/2016   Procedure: PARATHYROIDECTOMY, NECK EXPLORATION;  Surgeon: Jackolyn Confer, MD;  Location: [facility];  Service: General;  Laterality: N/A;  . TONSILLECTOMY      There were no vitals filed for this  visit.  Subjective Assessment - 06/07/19 0911    Subjective  Pt states that she is not sure if she is completing the exercises correctly or not.    Pertinent History  Back pain, Lt knee pain, HTN    Limitations  Standing;Lifting;Walking;House hold activities    How long can you sit comfortably?  no problem    How long can you stand comfortably?  Able to stand at least 15 minutes she really doesn' stand much    How long can you walk comfortably?  Pt does not walk for exercise.  Able to complete grocery shoppping but is fatigued after    Diagnostic tests  IMPRESSION:1. Multilevel degenerative changes of the cervical spine withposterior disc protrusions at C3-4 through C5-6 causing flatteningof the anterior surface of the spinal cord and resulting in mildspinal canal stenosis at C3-4 and C4-5. No spinal cord signalabnormality.2. No high-grade neural foraminal narrowing.3. A 4 mm T1 hyperintense, T2 hypointense structure within thepituitary gland, just posterior to the pituitary stalk, likelyrepresenting a Rathke's cleft cyst.    Patient Stated Goals  To walk better, stop falling and less knee pain, be safer going up and down steps    Currently in Pain?  No/denies                       [facility] Adult PT Treatment/Exercise - 06/07/19 0001      Ambulation/Gait   Ambulation Distance (Feet)  --    Stairs  --    Gait Comments  --      Posture/Postural Control   Posture/Postural Control  Postural limitations    Postural Limitations  Rounded Shoulders;Increased thoracic kyphosis      Exercises   Exercises  Neck;Lumbar;Knee/Hip;Ankle      Neck Exercises: Seated   Neck Retraction  10 reps    Neck Retraction Limitations  with scapular retraction     Other Seated Exercise  SNAG cervical x 10      Neck Exercises: Supine   Neck Retraction  5 reps    Capital Flexion Limitations  scapular retraction    Cervical Rotation  10 reps      Lumbar Exercises: Standing   Heel Raises  10 reps     Functional Squats  10 reps    Other Standing Lumbar Exercises  single leg stance  x 3       Lumbar Exercises: Seated   Long Arc Quad on Chair  Left;10 reps    LAQ on Chair Weights (lbs)  3      Lumbar Exercises: Supine   Other Supine Lumbar Exercises  Lt quad set to improve extension/terminal strength x 5       Manual Therapy   Manual Therapy  Soft tissue mobilization;Manual Traction    Manual therapy comments  done seperate from all other aspects of treatment     Soft tissue mobilization  to decrease spasms    Manual Traction  5 x 15"             PT Education - 06/07/19 0933    Education Details  SNAG    Person(s) Educated  Patient    Methods  Explanation;Handout    Comprehension  Verbalized understanding       PT Short Term Goals - 06/07/19 0937      PT SHORT TERM GOAL #1   Title  Pt to state that Lt knee pain is no greater than 2/10 to allow pt to ambulate for 25 mintues without difficulty to allow shopping without difficulty    Time  3    Period  Weeks    Status  On-going    Target Date  06/26/19      PT SHORT TERM GOAL #2   Title  Pt to be able to single leg stance for 25" on both LE to reduce risk of falling    Time  3    Period  Weeks    Status  On-going      PT SHORT TERM GOAL #3   Title  PT LE and core strength to have increased to allow pt to be able to come sit to stand with ease and be able to complete 8 sit to stand in a 30 second period    Time  3    Period  Weeks    Status  On-going      PT SHORT TERM GOAL #4   Title  Pt Lt knee extension to be 5 degees or less to allow a more normalized gait pattern    Time  3    Period  Weeks    Status  On-going        PT  Long Term Goals - 06/07/19 [id number]      PT LONG TERM GOAL #1   Title  PT to be able to single leg stance for 45" on both LE in order to have no falls in the past three weeks.    Time  6    Period  Weeks    Status  On-going      PT LONG TERM GOAL #2   Title  Pt  LE and core  strength to increase to allow pt to be able to ascend and descend 8 steps in a reciprocal manner with one handrail without difficulty.    Time  6    Period  Weeks    Status  On-going      PT LONG TERM GOAL #3   Title  Pt Lt knee extension to be 3 or less and gait to be normalized by increasing ankle and knee flexion while ambulating    Time  6    Period  Weeks    Status  On-going      PT LONG TERM GOAL #4   Title  PT to be able to walk for 60 mintues or more without difficulty to allow pt to shop without hesitation.    Time  6    Period  Weeks    Status  On-going            Plan - 06/07/19 0934    Clinical Impression Statement  Evaluation and goals reviewed with patient.  Reviewed initial exercises.  Added SNAG for additional HEP.  PT only needs minimal cuing for proper technique of exercises.    Personal Factors and Comorbidities  Comorbidity 1;Fitness;Past/Current Experience    Comorbidities  see MRI    Examination-Activity Limitations  Carry;Lift;Locomotion Level;Stairs;Stand    Examination-Participation Restrictions  Cleaning;Community Activity;Laundry;Shop    Stability/Clinical Decision Making  Evolving/Moderate complexity    Rehab Potential  Good    PT Frequency  2x / week    PT Duration  6 weeks    PT Treatment/Interventions  ADLs/Self Care Home Management;Therapeutic exercise;Therapeutic activities;Balance training;Neuromuscular re-education;Patient/family education;Manual techniques;Gait training;Stair training;Functional mobility training;Aquatic Therapy    PT Next Visit Plan  Begin , side stepping postureal  tband exercises    PT Home Exercise Plan  scapular and cervical retraction, Lt LAQ, Lt Quad set, bridge.    Consulted and Agree with Plan of Care  Patient       Patient will benefit from skilled therapeutic intervention in order to improve the following deficits and impairments:  Abnormal gait, Decreased activity tolerance, Decreased balance, Decreased  coordination, Decreased range of motion, Decreased strength, Difficulty walking, Pain  Visit Diagnosis: History of falling  Muscle weakness (generalized)  Other abnormalities of gait and mobility     Problem List Patient Active Problem List   Diagnosis Date Noted  . Encounter for well woman exam with routine gynecological exam 01/09/2019  . Screening for colorectal cancer 01/09/2019  . Encounter for screening colonoscopy 12/13/2017  . Hyperparathyroidism, primary ([place]) 12/22/2016  . Vitamin D deficiency 10/31/2015  . Hyperuricemia 05/08/2013  . ONYCHOMYCOSIS, TOENAILS 02/22/2009  . NEVI, MULTIPLE 02/22/2009  . Diabetes mellitus ([place]) 01/25/2008  . BACK PAIN WITH RADICULOPATHY 11/09/2007  . Hyperlipidemia LDL goal <100 08/26/2007  . Morbid obesity ([place]) 08/26/2007  . Essential hypertension 08/26/2007  . Obstructive sleep apnea 04/20/2007    Rayetta Humphrey, PT CLT [phone] 06/07/2019, 9:56 AM  [place],  [name], [address] Phone: [phone]   Fax:  [phone]  Name: Pamela Stein MRN: [id number] Date of Birth: March 20, 1955

## 2019-06-08 ENCOUNTER — Ambulatory Visit: Payer: Federal, State, Local not specified - PPO | Admitting: Orthopaedic Surgery

## 2019-06-12 ENCOUNTER — Other Ambulatory Visit: Payer: Self-pay

## 2019-06-12 ENCOUNTER — Ambulatory Visit (HOSPITAL_COMMUNITY): Payer: Federal, State, Local not specified - PPO | Attending: Neurology | Admitting: Physical Therapy

## 2019-06-12 DIAGNOSIS — R2689 Other abnormalities of gait and mobility: Secondary | ICD-10-CM | POA: Insufficient documentation

## 2019-06-12 DIAGNOSIS — Z9181 History of falling: Secondary | ICD-10-CM | POA: Diagnosis not present

## 2019-06-12 DIAGNOSIS — M6281 Muscle weakness (generalized): Secondary | ICD-10-CM | POA: Insufficient documentation

## 2019-06-12 NOTE — Therapy (Signed)
[place], [name], [address] Phone: [phone]   Fax:  [phone]  Physical Therapy Treatment  Patient Details  Name: Pamela Stein MRN: [id number] Date of Birth: March 19, 1955 Referring Provider (PT): Phillips Odor   Encounter Date: 06/12/2019  PT End of Session - 06/12/19 1700    Visit Number  3    Number of Visits  12    Date for PT Re-Evaluation  07/18/19    Authorization - Visit Number  3    Authorization - Number of Visits  50    Progress Note Due on Visit  10    PT Start Time  1000    PT Stop Time  1045    PT Time Calculation (min)  45 min    Activity Tolerance  Patient tolerated treatment well    Behavior During Therapy  [facility] for tasks assessed/performed       Past Medical History:  Diagnosis Date  . Anxiety   . Back pain   . Bronchitis   . Complication of anesthesia    stopped breathing after endometrial ablation procedure prior to her being diagn. with OSA  . Diabetes mellitus   . Dizziness   . Heart murmur   . History of gout   . Hyperlipidemia   . Hypertension   . Neuropathy   . Obesity   . Obstructive sleep apnea    CPAP    Past Surgical History:  Procedure Laterality Date  . ABDOMINAL HYSTERECTOMY    . ADENOIDECTOMY    . CATARACT EXTRACTION Left    right 02/2018  . COLONOSCOPY  2008   diminutive rectal polyp, s/p removal. polypoid mucosa  . COLONOSCOPY WITH PROPOFOL N/A 01/03/2018   Procedure: COLONOSCOPY WITH PROPOFOL;  Surgeon: Daneil Dolin, MD;  [facility];  Service: Endoscopy;  Laterality: N/A;  12:00pm  . DILATION AND CURETTAGE OF UTERUS    . ENDOMETRIAL ABLATION    . EYE SURGERY Left 12/04/2013   cataract  . PARATHYROIDECTOMY N/A 12/22/2016   Procedure: PARATHYROIDECTOMY, NECK EXPLORATION;  Surgeon: Jackolyn Confer, MD;  [facility];  Service: General;  Laterality: N/A;  . TONSILLECTOMY      There were no vitals filed for this  visit.  Subjective Assessment - 06/12/19 1007    Subjective  pt states she thinks she overdid it over the weekend.  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  States she may have also slept wrong last night.   States her pain is mostly on her Rt side/hip region 2/10    Currently in Pain?  Yes    Pain Score  2     Pain Location  Back    Pain Orientation  Right;Mid;Lower    Pain Descriptors / Indicators  Aching                       [facility] Adult PT Treatment/Exercise - 06/12/19 0001      Neck Exercises: Standing   Other Standing Exercises  side stepping 2RT      Neck Exercises: Seated   Neck Retraction  10 reps    Neck Retraction Limitations  with scapular retraction       Lumbar Exercises: Standing   Heel Raises  15 reps    Functional Squats  15 reps    Scapular Retraction  10 reps    Theraband  Level (Scapular Retraction)  Level 2 (Red)    Row  10 reps    Theraband Level (Row)  Level 2 (Red)    Shoulder Extension  Both;10 reps;Theraband    Theraband Level (Shoulder Extension)  Level 2 (Red)    Other Standing Lumbar Exercises  single leg stance  x 3 ; best times Rt: 10", Lt: 14"    Other Standing Lumbar Exercises  vector stance with 1 HR 5X5" each LE      Manual Therapy   Manual Therapy  Soft tissue mobilization    Manual therapy comments  done seperate from all other aspects of treatment     Soft tissue mobilization  prone to lumbar region to reduce pain                PT Short Term Goals - 06/07/19 0937      PT SHORT TERM GOAL #1   Title  Pt to state that Lt knee pain is no greater than 2/10 to allow pt to ambulate for 25 mintues without difficulty to allow shopping without difficulty    Time  3    Period  Weeks    Status  On-going    Target Date  06/26/19      PT SHORT TERM GOAL #2   Title  Pt to be able to single leg stance for 25" on both LE to reduce risk of falling    Time  3    Period  Weeks     Status  On-going      PT SHORT TERM GOAL #3   Title  PT LE and core strength to have increased to allow pt to be able to come sit to stand with ease and be able to complete 8 sit to stand in a 30 second period    Time  3    Period  Weeks    Status  On-going      PT SHORT TERM GOAL #4   Title  Pt Lt knee extension to be 5 degees or less to allow a more normalized gait pattern    Time  3    Period  Weeks    Status  On-going        PT Long Term Goals - 06/07/19 [id number]      PT LONG TERM GOAL #1   Title  PT to be able to single leg stance for 45" on both LE in order to have no falls in the past three weeks.    Time  6    Period  Weeks    Status  On-going      PT LONG TERM GOAL #2   Title  Pt  LE and core strength to increase to allow pt to be able to ascend and descend 8 steps in a reciprocal manner with one handrail without difficulty.    Time  6    Period  Weeks    Status  On-going      PT LONG TERM GOAL #3   Title  Pt Lt knee extension to be 3 or less and gait to be normalized by increasing ankle and knee flexion while ambulating    Time  6    Period  Weeks    Status  On-going      PT LONG TERM GOAL #4   Title  PT to be able to walk for 60 mintues or more without difficulty to allow pt to shop without hesitation.  Time  6    Period  Weeks    Status  On-going            Plan - 06/12/19 1657    Clinical Impression Statement  Added new therex this session including postural strengthening, vectors to improve balance and side stepping.  pt able to complete all without complaints.  Focused manual this session on Rt lumbar region as she is more painful here today. Able to fully resolve tightness and pt noted no pain in lumbar region at end of session.    Personal Factors and Comorbidities  Comorbidity 1;Fitness;Past/Current Experience    Comorbidities  see MRI    Examination-Activity Limitations  Carry;Lift;Locomotion Level;Stairs;Stand    Examination-Participation  Restrictions  Cleaning;Community Activity;Laundry;Shop    Stability/Clinical Decision Making  Evolving/Moderate complexity    Rehab Potential  Good    PT Frequency  2x / week    PT Duration  6 weeks    PT Treatment/Interventions  ADLs/Self Care Home Management;Therapeutic exercise;Therapeutic activities;Balance training;Neuromuscular re-education;Patient/family education;Manual techniques;Gait training;Stair training;Functional mobility training;Aquatic Therapy    PT Next Visit Plan  Continue to progress towards goals.  complete manual as needed for pain.    PT Home Exercise Plan  scapular and cervical retraction, Lt LAQ, Lt Quad set, bridge.    Consulted and Agree with Plan of Care  Patient       Patient will benefit from skilled therapeutic intervention in order to improve the following deficits and impairments:  Abnormal gait, Decreased activity tolerance, Decreased balance, Decreased coordination, Decreased range of motion, Decreased strength, Difficulty walking, Pain  Visit Diagnosis: History of falling  Other abnormalities of gait and mobility  Muscle weakness (generalized)     Problem List Patient Active Problem List   Diagnosis Date Noted  . Encounter for well woman exam with routine gynecological exam 01/09/2019  . Screening for colorectal cancer 01/09/2019  . Encounter for screening colonoscopy 12/13/2017  . Hyperparathyroidism, primary ([place]) 12/22/2016  . Vitamin D deficiency 10/31/2015  . Hyperuricemia 05/08/2013  . ONYCHOMYCOSIS, TOENAILS 02/22/2009  . NEVI, MULTIPLE 02/22/2009  . Diabetes mellitus ([place]) 01/25/2008  . BACK PAIN WITH RADICULOPATHY 11/09/2007  . Hyperlipidemia LDL goal <100 08/26/2007  . Morbid obesity ([place]) 08/26/2007  . Essential hypertension 08/26/2007  . Obstructive sleep apnea 04/20/2007    Teena Irani 06/12/2019, 5:00 PM  [place], [name], [address] Phone:  [phone]   Fax:  [phone]  Name: DELAYZA COWDREY MRN: [id number] Date of Birth: [birth year]

## 2019-06-13 ENCOUNTER — Encounter: Payer: Self-pay | Admitting: Orthopaedic Surgery

## 2019-06-13 ENCOUNTER — Ambulatory Visit: Payer: Federal, State, Local not specified - PPO | Admitting: Orthopaedic Surgery

## 2019-06-13 ENCOUNTER — Ambulatory Visit: Payer: Federal, State, Local not specified - PPO

## 2019-06-13 VITALS — BP 156/78 | HR 97 | Ht 62.5 in | Wt 220.0 lb

## 2019-06-13 DIAGNOSIS — G8929 Other chronic pain: Secondary | ICD-10-CM

## 2019-06-13 DIAGNOSIS — M25561 Pain in right knee: Secondary | ICD-10-CM

## 2019-06-13 DIAGNOSIS — M25562 Pain in left knee: Secondary | ICD-10-CM

## 2019-06-13 NOTE — Progress Notes (Signed)
Subjective:    Patient ID: Pamela Stein, female    DOB: July 27, 1955, 64 y.o.   MRN: [id number]  HPI She has had right knee pain for some time.  She has swelling and popping and giving way.  It gives away daily.  She has no redness, no trauma. She has been seen by Dr. Merlene Laughter recently and I have reviewed her notes.  She says nothing helps her knee pain.   Review of Systems  Constitutional: Positive for activity change.  Musculoskeletal: Positive for arthralgias, back pain, gait problem and joint swelling.  Psychiatric/Behavioral: The patient is nervous/anxious.   All other systems reviewed and are negative.  For Review of Systems, all other systems reviewed and are negative.  The following is a summary of the past history medically, past history surgically, known current medicines, social history and family history.  This information is gathered electronically by the computer from prior information and documentation.  I review this each visit and have found including this information at this point in the chart is beneficial and informative.   Past Medical History:  Diagnosis Date  . Anxiety   . Back pain   . Bronchitis   . Complication of anesthesia    stopped breathing after endometrial ablation procedure prior to her being diagn. with OSA  . Diabetes mellitus   . Dizziness   . Heart murmur   . History of gout   . Hyperlipidemia   . Hypertension   . Neuropathy   . Obesity   . Obstructive sleep apnea    CPAP    Past Surgical History:  Procedure Laterality Date  . ABDOMINAL HYSTERECTOMY    . ADENOIDECTOMY    . CATARACT EXTRACTION Left    right 02/2018  . COLONOSCOPY  2008   diminutive rectal polyp, s/p removal. polypoid mucosa  . COLONOSCOPY WITH PROPOFOL N/A 01/03/2018   Procedure: COLONOSCOPY WITH PROPOFOL;  Surgeon: Daneil Dolin, MD;  [facility];  Service: Endoscopy;  Laterality: N/A;  12:00pm  . DILATION AND CURETTAGE OF UTERUS    . ENDOMETRIAL  ABLATION    . EYE SURGERY Left 12/04/2013   cataract  . PARATHYROIDECTOMY N/A 12/22/2016   Procedure: PARATHYROIDECTOMY, NECK EXPLORATION;  Surgeon: Jackolyn Confer, MD;  [facility];  Service: General;  Laterality: N/A;  . TONSILLECTOMY      Current Outpatient Medications on File Prior to Visit  Medication Sig Dispense Refill  . albuterol (PROVENTIL HFA;VENTOLIN HFA) 108 (90 Base) MCG/ACT inhaler Inhale 1-2 puffs into the lungs every 6 (six) hours as needed for wheezing or shortness of breath. 1 Inhaler 0  . amLODipine (NORVASC) 10 MG tablet TAKE 1 TABLET BY MOUTH DAILY (Patient taking differently: Take 10 mg by mouth daily. ) 30 tablet 4  . aspirin EC 81 MG tablet Take 81 mg by mouth daily.    . DULoxetine (CYMBALTA) 60 MG capsule Take 1 capsule every day by oral route.    . furosemide (LASIX) 20 MG tablet Take 20 mg by mouth daily.   3  . gabapentin (NEURONTIN) 300 MG capsule Take 1 capsule (300 mg total) by mouth 3 (three) times daily. (Patient taking differently: Take 300 mg by mouth daily. ) 90 capsule 3  . losartan (COZAAR) 25 MG tablet Take 25 mg by mouth daily.   3  . metFORMIN (GLUCOPHAGE) 500 MG tablet TAKE 1 TABLET BY MOUTH TWICE DAILY WITH A MEAL (Patient taking differently: Take 500 mg by mouth daily  with lunch. ) 180 tablet 1  . pravastatin (PRAVACHOL) 80 MG tablet TAKE 1 TABLET(80 MG) BY MOUTH EVERY EVENING (Patient taking differently: Take 80 mg by mouth every evening. ) 90 tablet 0  . spironolactone (ALDACTONE) 25 MG tablet Take 25 mg by mouth daily.   3   No current facility-administered medications on file prior to visit.    Social History   Socioeconomic History  . Marital status: Single    Spouse name: Not on file  . Number of children: Not on file  . Years of education: Not on file  . Highest education level: Associate degree: occupational, Hotel manager, or vocational program  Occupational History  . Occupation: Primary school teacher  Tobacco Use  . Smoking status:  Never Smoker  . Smokeless tobacco: Never Used  Substance and Sexual Activity  . Alcohol use: No    Alcohol/week: 0.0 standard drinks  . Drug use: No  . Sexual activity: Not Currently    Birth control/protection: Surgical    Comment: hyst  Other Topics Concern  . Not on file  Social History Narrative   Lives alone   caffeine - occas tea   Social Determinants of Health   Financial Resource Strain:   . Difficulty of Paying Living Expenses:   Food Insecurity:   . Worried About Charity fundraiser in the Last Year:   . Arboriculturist in the Last Year:   Transportation Needs:   . Film/video editor (Medical):   Marland Kitchen Lack of Transportation (Non-Medical):   Physical Activity:   . Days of Exercise per Week:   . Minutes of Exercise per Session:   Stress:   . Feeling of Stress :   Social Connections:   . Frequency of Communication with Friends and Family:   . Frequency of Social Gatherings with Friends and Family:   . Attends Religious Services:   . Active Member of Clubs or Organizations:   . Attends Archivist Meetings:   Marland Kitchen Marital Status:   Intimate Partner Violence:   . Fear of Current or Ex-Partner:   . Emotionally Abused:   Marland Kitchen Physically Abused:   . Sexually Abused:     Family History  Problem Relation Age of Onset  . Cancer Father        throat  . Throat cancer Father   . Hypertension Father   . Diabetes Father   . Diabetes Maternal Grandmother   . Hypertension Maternal Grandfather   . Hypertension Paternal Grandmother   . Diabetes Paternal Grandfather   . Hypertension Mother   . Diabetes Mother   . Cancer Paternal Aunt   . Colon cancer Neg Hx   . Colon polyps Neg Hx     BP (!) 156/78   Pulse 97   Ht 5' 2.5" (1.588 m)   Wt 220 lb (99.8 kg)   BMI 39.60 kg/m   Body mass index is 39.6 kg/m.     Objective:   Physical Exam Vitals and nursing note reviewed.  Constitutional:      Appearance: She is well-developed.  HENT:     Head:  Normocephalic and atraumatic.  Eyes:     Conjunctiva/sclera: Conjunctivae normal.     Pupils: Pupils are equal, round, and reactive to light.  Cardiovascular:     Rate and Rhythm: Normal rate and regular rhythm.  Pulmonary:     Effort: Pulmonary effort is normal.  Abdominal:     Palpations: Abdomen is soft.  Musculoskeletal:  Cervical back: Normal range of motion and neck supple.       Legs:  Skin:    General: Skin is warm and dry.  Neurological:     Mental Status: She is alert and oriented to person, place, and time.     Cranial Nerves: No cranial nerve deficit.     Motor: No abnormal muscle tone.     Coordination: Coordination normal.     Deep Tendon Reflexes: Reflexes are normal and symmetric. Reflexes normal.  Psychiatric:        Behavior: Behavior normal.        Thought Content: Thought content normal.        Judgment: Judgment normal.    X-rays were done of the left knee, reported separately.       Assessment & Plan:   Encounter Diagnoses  Name Primary?  . Chronic pain of right knee   . Chronic pain of left knee Yes   PROCEDURE NOTE:  The patient requests injections of the left knee , verbal consent was obtained.  The left knee was prepped appropriately after time out was performed.   Sterile technique was observed and injection of 1 cc of Depo-Medrol 40 mg with several cc's of plain xylocaine. Anesthesia was provided by ethyl chloride and a 20-gauge needle was used to inject the knee area. The injection was tolerated well.  A band aid dressing was applied.  The patient was advised to apply ice later today and tomorrow to the injection sight as needed.  I am concerned about a medial meniscus tear on the left knee.  I will begin PT and exercises.  Return in two weeks.  Call if any problem.  Precautions discussed.   Electronically Signed Sanjuana Kava, MD 5/4/20219:24 AM

## 2019-06-14 ENCOUNTER — Other Ambulatory Visit: Payer: Self-pay

## 2019-06-14 ENCOUNTER — Ambulatory Visit (HOSPITAL_COMMUNITY): Payer: Federal, State, Local not specified - PPO | Admitting: Physical Therapy

## 2019-06-14 DIAGNOSIS — R2689 Other abnormalities of gait and mobility: Secondary | ICD-10-CM | POA: Diagnosis not present

## 2019-06-14 DIAGNOSIS — M6281 Muscle weakness (generalized): Secondary | ICD-10-CM

## 2019-06-14 DIAGNOSIS — Z9181 History of falling: Secondary | ICD-10-CM

## 2019-06-14 NOTE — Therapy (Signed)
[place], [name], [address] Phone: [phone]   Fax:  [phone]  Physical Therapy Treatment  Patient Details  Name: Pamela Stein MRN: [id number] Date of Birth: Apr 04, 1955 Referring Provider (PT): Phillips Odor   Encounter Date: 06/14/2019  PT End of Session - 06/14/19 1002    Visit Number  4    Number of Visits  12    Date for PT Re-Evaluation  07/18/19    Authorization - Visit Number  4    Authorization - Number of Visits  50    Progress Note Due on Visit  10    PT Start Time  0915    PT Stop Time  1000    PT Time Calculation (min)  45 min    Activity Tolerance  Patient tolerated treatment well    Behavior During Therapy  [facility] for tasks assessed/performed       Past Medical History:  Diagnosis Date  . Anxiety   . Back pain   . Bronchitis   . Complication of anesthesia    stopped breathing after endometrial ablation procedure prior to her being diagn. with OSA  . Diabetes mellitus   . Dizziness   . Heart murmur   . History of gout   . Hyperlipidemia   . Hypertension   . Neuropathy   . Obesity   . Obstructive sleep apnea    CPAP    Past Surgical History:  Procedure Laterality Date  . ABDOMINAL HYSTERECTOMY    . ADENOIDECTOMY    . CATARACT EXTRACTION Left    right 02/2018  . COLONOSCOPY  2008   diminutive rectal polyp, s/p removal. polypoid mucosa  . COLONOSCOPY WITH PROPOFOL N/A 01/03/2018   Procedure: COLONOSCOPY WITH PROPOFOL;  Surgeon: Daneil Dolin, MD;  [facility];  Service: Endoscopy;  Laterality: N/A;  12:00pm  . DILATION AND CURETTAGE OF UTERUS    . ENDOMETRIAL ABLATION    . EYE SURGERY Left 12/04/2013   cataract  . PARATHYROIDECTOMY N/A 12/22/2016   Procedure: PARATHYROIDECTOMY, NECK EXPLORATION;  Surgeon: Jackolyn Confer, MD;  Location: [facility];  Service: General;  Laterality: N/A;  . TONSILLECTOMY      There were no vitals filed for this  visit.  Subjective Assessment - 06/14/19 0944    Subjective  Pt states she went back to Dr Denice Bors regarding her knee yesterday and he injected it.  Pt states it is so much better, no pain at all in her Lt knee.  States her back and neck are not bothering her today either.    Currently in Pain?  No/denies                       [facility] Adult PT Treatment/Exercise - 06/14/19 0001      Neck Exercises: Standing   Other Standing Exercises  side stepping 2RT      Lumbar Exercises: Stretches   Other Lumbar Stretch Exercise  slant board stretch 3X30"      Lumbar Exercises: Standing   Heel Raises  15 reps    Functional Squats  15 reps    Forward Lunge  10 reps    Forward Lunge Limitations  onto 4" step no UE for stability    Scapular Retraction  10 reps    Theraband Level (Scapular Retraction)  Level 3 (Green)    Row  10 reps    Theraband Level (Row)  Level 3 (  Green)    Shoulder Extension  Both;10 reps;Theraband    Theraband Level (Shoulder Extension)  Level 3 (Green)      Lumbar Exercises: Seated   Sit to Stand  10 reps    Sit to Stand Limitations  no UE's             PT Education - 06/14/19 1003    Education Details  Discussed new referral for Lt knee and determined to keep things as they are as LE strength is also being addressed with current treatment.    Person(s) Educated  Patient    Methods  Explanation    Comprehension  Verbalized understanding       PT Short Term Goals - 06/07/19 0937      PT SHORT TERM GOAL #1   Title  Pt to state that Lt knee pain is no greater than 2/10 to allow pt to ambulate for 25 mintues without difficulty to allow shopping without difficulty    Time  3    Period  Weeks    Status  On-going    Target Date  06/26/19      PT SHORT TERM GOAL #2   Title  Pt to be able to single leg stance for 25" on both LE to reduce risk of falling    Time  3    Period  Weeks    Status  On-going      PT SHORT TERM GOAL #3   Title  PT LE  and core strength to have increased to allow pt to be able to come sit to stand with ease and be able to complete 8 sit to stand in a 30 second period    Time  3    Period  Weeks    Status  On-going      PT SHORT TERM GOAL #4   Title  Pt Lt knee extension to be 5 degees or less to allow a more normalized gait pattern    Time  3    Period  Weeks    Status  On-going        PT Long Term Goals - 06/07/19 [id number]      PT LONG TERM GOAL #1   Title  PT to be able to single leg stance for 45" on both LE in order to have no falls in the past three weeks.    Time  6    Period  Weeks    Status  On-going      PT LONG TERM GOAL #2   Title  Pt  LE and core strength to increase to allow pt to be able to ascend and descend 8 steps in a reciprocal manner with one handrail without difficulty.    Time  6    Period  Weeks    Status  On-going      PT LONG TERM GOAL #3   Title  Pt Lt knee extension to be 3 or less and gait to be normalized by increasing ankle and knee flexion while ambulating    Time  6    Period  Weeks    Status  On-going      PT LONG TERM GOAL #4   Title  PT to be able to walk for 60 mintues or more without difficulty to allow pt to shop without hesitation.    Time  6    Period  Weeks    Status  On-going  Plan - 06/14/19 1020    Clinical Impression Statement  Pt overall better today without lumbar, neck or Lt knee pain.  pt does c/o slight pain in Rt knee at this point.  Continued with exercise progression with additon of slant board stretch and lunges. Pt able to complete all exercises with minimal cues and no complaints of pain during exercise.   discussed new referral for knee therapy with pateint and agreed to continue with present course which is also incorporating LE strengthening and stretching.  did not complete manual this session as without any general pain.  Informed we would complete this PRN as neede.  PT agreed with POC moving forward.    Personal  Factors and Comorbidities  Comorbidity 1;Fitness;Past/Current Experience    Comorbidities  see MRI    Examination-Activity Limitations  Carry;Lift;Locomotion Level;Stairs;Stand    Examination-Participation Restrictions  Cleaning;Community Activity;Laundry;Shop    Stability/Clinical Decision Making  Evolving/Moderate complexity    Rehab Potential  Good    PT Frequency  2x / week    PT Duration  6 weeks    PT Treatment/Interventions  ADLs/Self Care Home Management;Therapeutic exercise;Therapeutic activities;Balance training;Neuromuscular re-education;Patient/family education;Manual techniques;Gait training;Stair training;Functional mobility training;Aquatic Therapy    PT Next Visit Plan  Continue to progress towards goals.  complete manual as needed for pain.  Incorporate Lt knee into plan as well and progress as able.    PT Home Exercise Plan  scapular and cervical retraction, Lt LAQ, Lt Quad set, bridge.    Consulted and Agree with Plan of Care  Patient       Patient will benefit from skilled therapeutic intervention in order to improve the following deficits and impairments:  Abnormal gait, Decreased activity tolerance, Decreased balance, Decreased coordination, Decreased range of motion, Decreased strength, Difficulty walking, Pain  Visit Diagnosis: History of falling  Other abnormalities of gait and mobility  Muscle weakness (generalized)     Problem List Patient Active Problem List   Diagnosis Date Noted  . Polyneuropathy due to secondary diabetes mellitus ([place]) 05/25/2019  . Arthritis of right sacroiliac joint 05/25/2019  . Cervical disc disorder 05/25/2019  . Falls 05/25/2019  . General unsteadiness 05/25/2019  . Encounter for well woman exam with routine gynecological exam 01/09/2019  . Screening for colorectal cancer 01/09/2019  . Encounter for screening colonoscopy 12/13/2017  . Hyperparathyroidism, primary ([place]) 12/22/2016  . Vitamin D deficiency 10/31/2015  .  Hyperuricemia 05/08/2013  . ONYCHOMYCOSIS, TOENAILS 02/22/2009  . NEVI, MULTIPLE 02/22/2009  . Diabetes mellitus ([place]) 01/25/2008  . BACK PAIN WITH RADICULOPATHY 11/09/2007  . Hyperlipidemia LDL goal <100 08/26/2007  . Morbid obesity ([place]) 08/26/2007  . Essential hypertension 08/26/2007  . Obstructive sleep apnea 04/20/2007   Teena Irani, PTA/CLT [phone]  Teena Irani 06/14/2019, 10:25 AM  [place], [name], [address] Phone: [phone]   Fax:  [phone]  Name: Pamela Stein MRN: [id number] Date of Birth: 1955-05-13

## 2019-06-19 ENCOUNTER — Ambulatory Visit (HOSPITAL_COMMUNITY): Payer: Federal, State, Local not specified - PPO | Admitting: Physical Therapy

## 2019-06-19 ENCOUNTER — Other Ambulatory Visit: Payer: Self-pay

## 2019-06-19 DIAGNOSIS — R2689 Other abnormalities of gait and mobility: Secondary | ICD-10-CM | POA: Diagnosis not present

## 2019-06-19 DIAGNOSIS — M6281 Muscle weakness (generalized): Secondary | ICD-10-CM | POA: Diagnosis not present

## 2019-06-19 DIAGNOSIS — Z9181 History of falling: Secondary | ICD-10-CM | POA: Diagnosis not present

## 2019-06-19 NOTE — Therapy (Signed)
[place], [name], [address] Phone: [phone]   Fax:  [phone]  Physical Therapy Treatment  Patient Details  Name: Pamela Stein MRN: [id number] Date of Birth: 12-13-1955 Referring Provider (PT): Phillips Odor   Encounter Date: 06/19/2019  PT End of Session - 06/19/19 0957    Visit Number  5    Number of Visits  12    Date for PT Re-Evaluation  07/18/19    Authorization - Visit Number  5    Authorization - Number of Visits  50    Progress Note Due on Visit  10    PT Start Time  0922    PT Stop Time  1000    PT Time Calculation (min)  38 min    Activity Tolerance  Patient tolerated treatment well    Behavior During Therapy  [facility] for tasks assessed/performed       Past Medical History:  Diagnosis Date  . Anxiety   . Back pain   . Bronchitis   . Complication of anesthesia    stopped breathing after endometrial ablation procedure prior to her being diagn. with OSA  . Diabetes mellitus   . Dizziness   . Heart murmur   . History of gout   . Hyperlipidemia   . Hypertension   . Neuropathy   . Obesity   . Obstructive sleep apnea    CPAP    Past Surgical History:  Procedure Laterality Date  . ABDOMINAL HYSTERECTOMY    . ADENOIDECTOMY    . CATARACT EXTRACTION Left    right 02/2018  . COLONOSCOPY  2008   diminutive rectal polyp, s/p removal. polypoid mucosa  . COLONOSCOPY WITH PROPOFOL N/A 01/03/2018   Procedure: COLONOSCOPY WITH PROPOFOL;  Surgeon: Daneil Dolin, MD;  [facility];  Service: Endoscopy;  Laterality: N/A;  12:00pm  . DILATION AND CURETTAGE OF UTERUS    . ENDOMETRIAL ABLATION    . EYE SURGERY Left 12/04/2013   cataract  . PARATHYROIDECTOMY N/A 12/22/2016   Procedure: PARATHYROIDECTOMY, NECK EXPLORATION;  Surgeon: Jackolyn Confer, MD;  Location: [facility];  Service: General;  Laterality: N/A;  . TONSILLECTOMY      There were no vitals filed for this  visit.  Subjective Assessment - 06/19/19 0928    Subjective  Pt states her Rt knee is still bothering her and didn't get a chance to call MD.  Lt knee is not hurting since the shot.  STates her back and hips hurt her from working but feels good today without pain.    Currently in Pain?  No/denies                       [facility] Adult PT Treatment/Exercise - 06/19/19 0001      Lumbar Exercises: Standing   Heel Raises  15 reps    Functional Squats  15 reps    Forward Lunge  15 reps    Forward Lunge Limitations  onto 4" step no UE for stability    Scapular Retraction  15 reps    Theraband Level (Scapular Retraction)  Level 3 (Green)    Row  15 reps    Theraband Level (Row)  Level 3 (Green)    Shoulder Extension  15 reps    Theraband Level (Shoulder Extension)  Level 3 (Green)    Other Standing Lumbar Exercises  single leg stance  x 3 ; best times Rt:  16", Lt: 14"    Other Standing Lumbar Exercises  vector stance with 1 HR 5X5" each LE      Manual Therapy   Manual Therapy  Soft tissue mobilization    Manual therapy comments  done seperate from all other aspects of treatment     Soft tissue mobilization  prone to lumbar region to reduce pain                PT Short Term Goals - 06/07/19 0937      PT SHORT TERM GOAL #1   Title  Pt to state that Lt knee pain is no greater than 2/10 to allow pt to ambulate for 25 mintues without difficulty to allow shopping without difficulty    Time  3    Period  Weeks    Status  On-going    Target Date  06/26/19      PT SHORT TERM GOAL #2   Title  Pt to be able to single leg stance for 25" on both LE to reduce risk of falling    Time  3    Period  Weeks    Status  On-going      PT SHORT TERM GOAL #3   Title  PT LE and core strength to have increased to allow pt to be able to come sit to stand with ease and be able to complete 8 sit to stand in a 30 second period    Time  3    Period  Weeks    Status  On-going      PT  SHORT TERM GOAL #4   Title  Pt Lt knee extension to be 5 degees or less to allow a more normalized gait pattern    Time  3    Period  Weeks    Status  On-going        PT Long Term Goals - 06/07/19 [id number]      PT LONG TERM GOAL #1   Title  PT to be able to single leg stance for 45" on both LE in order to have no falls in the past three weeks.    Time  6    Period  Weeks    Status  On-going      PT LONG TERM GOAL #2   Title  Pt  LE and core strength to increase to allow pt to be able to ascend and descend 8 steps in a reciprocal manner with one handrail without difficulty.    Time  6    Period  Weeks    Status  On-going      PT LONG TERM GOAL #3   Title  Pt Lt knee extension to be 3 or less and gait to be normalized by increasing ankle and knee flexion while ambulating    Time  6    Period  Weeks    Status  On-going      PT LONG TERM GOAL #4   Title  PT to be able to walk for 60 mintues or more without difficulty to allow pt to shop without hesitation.    Time  6    Period  Weeks    Status  On-going            Plan - 06/19/19 [id number]    Clinical Impression Statement  Overall improvment with increased stance time today and increased ease with vector exercises.  Back began to bother her with postrual tband  strengthening and required 5 reps of lumbar extension between each exericse.  Verbal cues to tighten abdominal and use core stabilization to reduce lumbar symptoms.   Added manual at EOS today due to increasing discomfort with relief noted.  Pt is to purchase a ball to complele self manual at home.    Personal Factors and Comorbidities  Comorbidity 1;Fitness;Past/Current Experience    Comorbidities  see MRI    Examination-Activity Limitations  Carry;Lift;Locomotion Level;Stairs;Stand    Examination-Participation Restrictions  Cleaning;Community Activity;Laundry;Shop    Stability/Clinical Decision Making  Evolving/Moderate complexity    Rehab Potential  Good    PT Frequency   2x / week    PT Duration  6 weeks    PT Treatment/Interventions  ADLs/Self Care Home Management;Therapeutic exercise;Therapeutic activities;Balance training;Neuromuscular re-education;Patient/family education;Manual techniques;Gait training;Stair training;Functional mobility training;Aquatic Therapy    PT Next Visit Plan  Continue to progress towards goals.  complete manual as needed for pain.  Incorporate Lt knee into plan as well and progress as able.    PT Home Exercise Plan  scapular and cervical retraction, Lt LAQ, Lt Quad set, bridge.    Consulted and Agree with Plan of Care  Patient       Patient will benefit from skilled therapeutic intervention in order to improve the following deficits and impairments:  Abnormal gait, Decreased activity tolerance, Decreased balance, Decreased coordination, Decreased range of motion, Decreased strength, Difficulty walking, Pain  Visit Diagnosis: History of falling  Other abnormalities of gait and mobility  Muscle weakness (generalized)     Problem List Patient Active Problem List   Diagnosis Date Noted  . Polyneuropathy due to secondary diabetes mellitus ([place]) 05/25/2019  . Arthritis of right sacroiliac joint 05/25/2019  . Cervical disc disorder 05/25/2019  . Falls 05/25/2019  . General unsteadiness 05/25/2019  . Encounter for well woman exam with routine gynecological exam 01/09/2019  . Screening for colorectal cancer 01/09/2019  . Encounter for screening colonoscopy 12/13/2017  . Hyperparathyroidism, primary ([place]) 12/22/2016  . Vitamin D deficiency 10/31/2015  . Hyperuricemia 05/08/2013  . ONYCHOMYCOSIS, TOENAILS 02/22/2009  . NEVI, MULTIPLE 02/22/2009  . Diabetes mellitus ([place]) 01/25/2008  . BACK PAIN WITH RADICULOPATHY 11/09/2007  . Hyperlipidemia LDL goal <100 08/26/2007  . Morbid obesity ([place]) 08/26/2007  . Essential hypertension 08/26/2007  . Obstructive sleep apnea 04/20/2007   Teena Irani,  PTA/CLT [phone]  Teena Irani 06/19/2019, 9:58 AM  [place], [name], [address] Phone: [phone]   Fax:  [phone]  Name: AMEYALLI ELICKER MRN: [id number] Date of Birth: 01-Aug-1955

## 2019-06-21 ENCOUNTER — Ambulatory Visit (HOSPITAL_COMMUNITY): Payer: Federal, State, Local not specified - PPO | Admitting: Physical Therapy

## 2019-06-21 ENCOUNTER — Other Ambulatory Visit: Payer: Self-pay

## 2019-06-21 DIAGNOSIS — Z9181 History of falling: Secondary | ICD-10-CM

## 2019-06-21 DIAGNOSIS — M6281 Muscle weakness (generalized): Secondary | ICD-10-CM

## 2019-06-21 DIAGNOSIS — R2689 Other abnormalities of gait and mobility: Secondary | ICD-10-CM

## 2019-06-21 NOTE — Therapy (Signed)
[place], [name], [address] Phone: [phone]   Fax:  [phone]  Physical Therapy Treatment  Patient Details  Name: Pamela Stein MRN: [id number] Date of Birth: 1955/12/25 Referring Provider (PT): Phillips Odor   Encounter Date: 06/21/2019  PT End of Session - 06/21/19 1033    Visit Number  6    Number of Visits  12    Date for PT Re-Evaluation  07/18/19    Authorization - Visit Number  6    Authorization - Number of Visits  50    Progress Note Due on Visit  10    PT Start Time  1006    PT Stop Time  1045    PT Time Calculation (min)  39 min    Activity Tolerance  Patient tolerated treatment well    Behavior During Therapy  [facility] for tasks assessed/performed       Past Medical History:  Diagnosis Date  . Anxiety   . Back pain   . Bronchitis   . Complication of anesthesia    stopped breathing after endometrial ablation procedure prior to her being diagn. with OSA  . Diabetes mellitus   . Dizziness   . Heart murmur   . History of gout   . Hyperlipidemia   . Hypertension   . Neuropathy   . Obesity   . Obstructive sleep apnea    CPAP    Past Surgical History:  Procedure Laterality Date  . ABDOMINAL HYSTERECTOMY    . ADENOIDECTOMY    . CATARACT EXTRACTION Left    right 02/2018  . COLONOSCOPY  2008   diminutive rectal polyp, s/p removal. polypoid mucosa  . COLONOSCOPY WITH PROPOFOL N/A 01/03/2018   Procedure: COLONOSCOPY WITH PROPOFOL;  Surgeon: Daneil Dolin, MD;  [facility];  Service: Endoscopy;  Laterality: N/A;  12:00pm  . DILATION AND CURETTAGE OF UTERUS    . ENDOMETRIAL ABLATION    . EYE SURGERY Left 12/04/2013   cataract  . PARATHYROIDECTOMY N/A 12/22/2016   Procedure: PARATHYROIDECTOMY, NECK EXPLORATION;  Surgeon: Jackolyn Confer, MD;  Location: [facility];  Service: General;  Laterality: N/A;  . TONSILLECTOMY      There were no vitals filed for this  visit.  Subjective Assessment - 06/21/19 1012    Subjective  Pt states that now it's her Rt knee that is bothering her.  She is not sure what if anything is easier since starting therapy    Pertinent History  Back pain, Lt knee pain, HTN    Limitations  Standing;Lifting;Walking;House hold activities    How long can you sit comfortably?  no problem    How long can you stand comfortably?  Able to stand at least 15 minutes she really doesn' stand much    How long can you walk comfortably?  Pt does not walk for exercise.  Able to complete grocery shoppping but is fatigued after    Diagnostic tests  IMPRESSION:1. Multilevel degenerative changes of the cervical spine withposterior disc protrusions at C3-4 through C5-6 causing flatteningof the anterior surface of the spinal cord and resulting in mildspinal canal stenosis at C3-4 and C4-5. No spinal cord signalabnormality.2. No high-grade neural foraminal narrowing.3. A 4 mm T1 hyperintense, T2 hypointense structure within thepituitary gland, just posterior to the pituitary stalk, likelyrepresenting a Rathke's cleft cyst.    Patient Stated Goals  To walk better, stop falling and less knee pain, be safer going up and  down steps    Currently in Pain?  Yes    Pain Score  3     Pain Location  Knee    Pain Orientation  Right;Left    Pain Descriptors / Indicators  Aching    Pain Type  Acute pain    Pain Onset  More than a month ago    Pain Frequency  Intermittent    Aggravating Factors   wt bearing    Pain Relieving Factors  cream    Effect of Pain on Daily Activities  just keeps going                       [facility] Adult PT Treatment/Exercise - 06/21/19 0001      Lumbar Exercises: Standing   Heel Raises  15 reps    Functional Squats  15 reps    Forward Lunge  15 reps    Forward Lunge Limitations  onto 4" step no UE for stability    Scapular Retraction  15 reps    Theraband Level (Scapular Retraction)  Level 3 (Green)    Row  15 reps     Theraband Level (Row)  Level 3 (Green)    Shoulder Extension  15 reps    Theraband Level (Shoulder Extension)  Level 3 (Green)    Other Standing Lumbar Exercises  tandem stance with head turns, single leg stance  x 3 ; best times Rt: 24", Lt: 15"    Other Standing Lumbar Exercises  --      Lumbar Exercises: Seated   Sit to Stand  15 reps      Lumbar Exercises: Prone   Straight Leg Raise  10 reps      Manual Therapy   Manual Therapy  Soft tissue mobilization    Manual therapy comments  done seperate from all other aspects of treatment     Soft tissue mobilization  prone to lumbar region to reduce pain              PT Education - 06/21/19 1043    Education Details  hep       PT Short Term Goals - 06/07/19 0937      PT SHORT TERM GOAL #1   Title  Pt to state that Lt knee pain is no greater than 2/10 to allow pt to ambulate for 25 mintues without difficulty to allow shopping without difficulty    Time  3    Period  Weeks    Status  On-going    Target Date  06/26/19      PT SHORT TERM GOAL #2   Title  Pt to be able to single leg stance for 25" on both LE to reduce risk of falling    Time  3    Period  Weeks    Status  On-going      PT SHORT TERM GOAL #3   Title  PT LE and core strength to have increased to allow pt to be able to come sit to stand with ease and be able to complete 8 sit to stand in a 30 second period    Time  3    Period  Weeks    Status  On-going      PT SHORT TERM GOAL #4   Title  Pt Lt knee extension to be 5 degees or less to allow a more normalized gait pattern    Time  3  Period  Weeks    Status  On-going        PT Long Term Goals - 06/07/19 [id number]      PT LONG TERM GOAL #1   Title  PT to be able to single leg stance for 45" on both LE in order to have no falls in the past three weeks.    Time  6    Period  Weeks    Status  On-going      PT LONG TERM GOAL #2   Title  Pt  LE and core strength to increase to allow pt to be able to  ascend and descend 8 steps in a reciprocal manner with one handrail without difficulty.    Time  6    Period  Weeks    Status  On-going      PT LONG TERM GOAL #3   Title  Pt Lt knee extension to be 3 or less and gait to be normalized by increasing ankle and knee flexion while ambulating    Time  6    Period  Weeks    Status  On-going      PT LONG TERM GOAL #4   Title  PT to be able to walk for 60 mintues or more without difficulty to allow pt to shop without hesitation.    Time  6    Period  Weeks    Status  On-going            Plan - 06/21/19 1035    Clinical Impression Statement  Updated HEP PT stability with walking is improving but is complaining of B knee and back pain at this time.  Explained to pt that all exercises that we are completing are addessing these areas as well    Personal Factors and Comorbidities  Comorbidity 1;Fitness;Past/Current Experience    Comorbidities  see MRI    Examination-Activity Limitations  Carry;Lift;Locomotion Level;Stairs;Stand    Examination-Participation Restrictions  Cleaning;Community Activity;Laundry;Shop    Stability/Clinical Decision Making  Evolving/Moderate complexity    Rehab Potential  Good    PT Frequency  2x / week    PT Duration  6 weeks    PT Treatment/Interventions  ADLs/Self Care Home Management;Therapeutic exercise;Therapeutic activities;Balance training;Neuromuscular re-education;Patient/family education;Manual techniques;Gait training;Stair training;Functional mobility training;Aquatic Therapy    PT Next Visit Plan  Continue to progress towards goals.  complete manual as needed for pain.  add marching for balance    PT Home Exercise Plan  scapular and cervical retraction, Lt LAQ, Lt Quad set, bridge.; 5: 12:  sit to stand, heel raises, functional squat and lunges    Consulted and Agree with Plan of Care  Patient       Patient will benefit from skilled therapeutic intervention in order to improve the following deficits  and impairments:  Abnormal gait, Decreased activity tolerance, Decreased balance, Decreased coordination, Decreased range of motion, Decreased strength, Difficulty walking, Pain  Visit Diagnosis: History of falling  Other abnormalities of gait and mobility  Muscle weakness (generalized)     Problem List Patient Active Problem List   Diagnosis Date Noted  . Polyneuropathy due to secondary diabetes mellitus ([place]) 05/25/2019  . Arthritis of right sacroiliac joint 05/25/2019  . Cervical disc disorder 05/25/2019  . Falls 05/25/2019  . General unsteadiness 05/25/2019  . Encounter for well woman exam with routine gynecological exam 01/09/2019  . Screening for colorectal cancer 01/09/2019  . Encounter for screening colonoscopy 12/13/2017  . Hyperparathyroidism, primary ([place]) 12/22/2016  .  Vitamin D deficiency 10/31/2015  . Hyperuricemia 05/08/2013  . ONYCHOMYCOSIS, TOENAILS 02/22/2009  . NEVI, MULTIPLE 02/22/2009  . Diabetes mellitus ([place]) 01/25/2008  . BACK PAIN WITH RADICULOPATHY 11/09/2007  . Hyperlipidemia LDL goal <100 08/26/2007  . Morbid obesity ([place]) 08/26/2007  . Essential hypertension 08/26/2007  . Obstructive sleep apnea 04/20/2007    Rayetta Humphrey, PT CLT [phone] 06/21/2019, 10:45 AM  [place] [address] [place], [name], [address] Phone: [phone]   Fax:  [phone]  Name: RHANDI KOFLER MRN: [id number] Date of Birth: 11-Aug-1955

## 2019-06-27 ENCOUNTER — Ambulatory Visit (HOSPITAL_COMMUNITY): Payer: Federal, State, Local not specified - PPO

## 2019-06-27 ENCOUNTER — Encounter (HOSPITAL_COMMUNITY): Payer: Self-pay

## 2019-06-27 ENCOUNTER — Other Ambulatory Visit: Payer: Self-pay

## 2019-06-27 DIAGNOSIS — M6281 Muscle weakness (generalized): Secondary | ICD-10-CM | POA: Diagnosis not present

## 2019-06-27 DIAGNOSIS — Z9181 History of falling: Secondary | ICD-10-CM | POA: Diagnosis not present

## 2019-06-27 DIAGNOSIS — R2689 Other abnormalities of gait and mobility: Secondary | ICD-10-CM | POA: Diagnosis not present

## 2019-06-27 NOTE — Therapy (Signed)
[place], [name], [address] Phone: [phone]   Fax:  [phone]  Physical Therapy Treatment  Patient Details  Name: Pamela Stein MRN: [id number] Date of Birth: Jan 26, 1956 Referring Provider (PT): Phillips Odor   Encounter Date: 06/27/2019  PT End of Session - 06/27/19 0928    Visit Number  7    Number of Visits  12    Date for PT Re-Evaluation  07/18/19    Authorization Type  [employer]    Authorization - Visit Number  6    Authorization - Number of Visits  50    Progress Note Due on Visit  10    PT Start Time  0920    PT Stop Time  1000    PT Time Calculation (min)  40 min    Activity Tolerance  Patient tolerated treatment well    Behavior During Therapy  [facility] for tasks assessed/performed       Past Medical History:  Diagnosis Date  . Anxiety   . Back pain   . Bronchitis   . Complication of anesthesia    stopped breathing after endometrial ablation procedure prior to her being diagn. with OSA  . Diabetes mellitus   . Dizziness   . Heart murmur   . History of gout   . Hyperlipidemia   . Hypertension   . Neuropathy   . Obesity   . Obstructive sleep apnea    CPAP    Past Surgical History:  Procedure Laterality Date  . ABDOMINAL HYSTERECTOMY    . ADENOIDECTOMY    . CATARACT EXTRACTION Left    right 02/2018  . COLONOSCOPY  2008   diminutive rectal polyp, s/p removal. polypoid mucosa  . COLONOSCOPY WITH PROPOFOL N/A 01/03/2018   Procedure: COLONOSCOPY WITH PROPOFOL;  Surgeon: Daneil Dolin, MD;  [facility];  Service: Endoscopy;  Laterality: N/A;  12:00pm  . DILATION AND CURETTAGE OF UTERUS    . ENDOMETRIAL ABLATION    . EYE SURGERY Left 12/04/2013   cataract  . PARATHYROIDECTOMY N/A 12/22/2016   Procedure: PARATHYROIDECTOMY, NECK EXPLORATION;  Surgeon: Jackolyn Confer, MD;  [facility];  Service: General;  Laterality: N/A;  . TONSILLECTOMY      There were no vitals  filed for this visit.  Subjective Assessment - 06/27/19 0925    Subjective  Pt stated she feels she slept wrong last night, current pain scale 2-3/10 lower back.  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  Stated she feels balance is most difficult, no report of recent fall.    Pertinent History  Back pain, Lt knee pain, HTN    Diagnostic tests  IMPRESSION:1. Multilevel degenerative changes of the cervical spine withposterior disc protrusions at C3-4 through C5-6 causing flatteningof the anterior surface of the spinal cord and resulting in mildspinal canal stenosis at C3-4 and C4-5. No spinal cord signalabnormality.2. No high-grade neural foraminal narrowing.3. A 4 mm T1 hyperintense, T2 hypointense structure within thepituitary gland, just posterior to the pituitary stalk, likelyrepresenting a Rathke's cleft cyst.    Patient Stated Goals  To walk better, stop falling and less knee pain, be safer going up and down steps    Currently in Pain?  Yes    Pain Score  3     Pain Location  Back    Pain Orientation  Lower    Pain Descriptors / Indicators  Aching    Pain Type  Acute pain    Pain Onset  More than a month ago    Pain Frequency  Intermittent    Aggravating Factors   weight bearing    Pain Relieving Factors  cream    Effect of Pain on Daily Activities  just keeps going.                        [place] Adult PT Treatment/Exercise - 06/27/19 0001      Lumbar Exercises: Standing   Forward Lunge  15 reps    Forward Lunge Limitations  onto 4" step no UE for stability    Scapular Retraction  15 reps    Theraband Level (Scapular Retraction)  Level 3 (Green)    Row  15 reps    Theraband Level (Row)  Level 3 (Green)    Shoulder Extension  15 reps    Theraband Level (Shoulder Extension)  Level 3 (Green)    Other Standing Lumbar Exercises  tandem stance with head turns, single leg stance  x 3 ; best times Rt: 20", LT 14"     Other Standing Lumbar Exercises  vector stance with 1 HR 5X5" each LE; March 10x 5" alternating no HHA      Manual Therapy   Manual Therapy  Soft tissue mobilization    Manual therapy comments  done seperate from all other aspects of treatment     Soft tissue mobilization  prone to lumbar region to reduce pain                PT Short Term Goals - 06/07/19 0937      PT SHORT TERM GOAL #1   Title  Pt to state that Lt knee pain is no greater than 2/10 to allow pt to ambulate for 25 mintues without difficulty to allow shopping without difficulty    Time  3    Period  Weeks    Status  On-going    Target Date  06/26/19      PT SHORT TERM GOAL #2   Title  Pt to be able to single leg stance for 25" on both LE to reduce risk of falling    Time  3    Period  Weeks    Status  On-going      PT SHORT TERM GOAL #3   Title  PT LE and core strength to have increased to allow pt to be able to come sit to stand with ease and be able to complete 8 sit to stand in a 30 second period    Time  3    Period  Weeks    Status  On-going      PT SHORT TERM GOAL #4   Title  Pt Lt knee extension to be 5 degees or less to allow a more normalized gait pattern    Time  3    Period  Weeks    Status  On-going        PT Long Term Goals - 06/07/19 [id number]      PT LONG TERM GOAL #1   Title  PT to be able to single leg stance for 45" on both LE in order to have no falls in the past three weeks.    Time  6    Period  Weeks    Status  On-going      PT LONG TERM GOAL #2  Title  Pt  LE and core strength to increase to allow pt to be able to ascend and descend 8 steps in a reciprocal manner with one handrail without difficulty.    Time  6    Period  Weeks    Status  On-going      PT LONG TERM GOAL #3   Title  Pt Lt knee extension to be 3 or less and gait to be normalized by increasing ankle and knee flexion while ambulating    Time  6    Period  Weeks    Status  On-going      PT LONG TERM GOAL  #4   Title  PT to be able to walk for 60 mintues or more without difficulty to allow pt to shop without hesitation.    Time  6    Period  Weeks    Status  On-going            Plan - 06/27/19 1642    Clinical Impression Statement  Continued wiht stability exercises and progressed balalnce training to improve SLS.  Pt tolerated well with no reports of increased pain, was limited by fatigue wiht activities.    Personal Factors and Comorbidities  Comorbidity 1;Fitness;Past/Current Experience    Comorbidities  see MRI    Examination-Activity Limitations  Carry;Lift;Locomotion Level;Stairs;Stand    Examination-Participation Restrictions  Cleaning;Community Activity;Laundry;Shop    Stability/Clinical Decision Making  Evolving/Moderate complexity    Clinical Decision Making  Moderate    Rehab Potential  Good    PT Frequency  2x / week    PT Duration  6 weeks    PT Treatment/Interventions  ADLs/Self Care Home Management;Therapeutic exercise;Therapeutic activities;Balance training;Neuromuscular re-education;Patient/family education;Manual techniques;Gait training;Stair training;Functional mobility training;Aquatic Therapy    PT Next Visit Plan  Continue to progress towards goals.  complete manual as needed for pain.  Progress balalnce and strengthening.    PT Home Exercise Plan  scapular and cervical retraction, Lt LAQ, Lt Quad set, bridge.; 5: 12:  sit to stand, heel raises, functional squat and lunges       Patient will benefit from skilled therapeutic intervention in order to improve the following deficits and impairments:  Abnormal gait, Decreased activity tolerance, Decreased balance, Decreased coordination, Decreased range of motion, Decreased strength, Difficulty walking, Pain  Visit Diagnosis: Other abnormalities of gait and mobility  Muscle weakness (generalized)  History of falling     Problem List Patient Active Problem List   Diagnosis Date Noted  . Polyneuropathy due  to secondary diabetes mellitus ([place]) 05/25/2019  . Arthritis of right sacroiliac joint 05/25/2019  . Cervical disc disorder 05/25/2019  . Falls 05/25/2019  . General unsteadiness 05/25/2019  . Encounter for well woman exam with routine gynecological exam 01/09/2019  . Screening for colorectal cancer 01/09/2019  . Encounter for screening colonoscopy 12/13/2017  . Hyperparathyroidism, primary ([place]) 12/22/2016  . Vitamin D deficiency 10/31/2015  . Hyperuricemia 05/08/2013  . ONYCHOMYCOSIS, TOENAILS 02/22/2009  . NEVI, MULTIPLE 02/22/2009  . Diabetes mellitus ([place]) 01/25/2008  . BACK PAIN WITH RADICULOPATHY 11/09/2007  . Hyperlipidemia LDL goal <100 08/26/2007  . Morbid obesity ([place]) 08/26/2007  . Essential hypertension 08/26/2007  . Obstructive sleep apnea 04/20/2007   Ihor Austin, LPTA/CLT; CBIS [phone]  Aldona Lento 06/27/2019, 4:47 PM  [place] [address] [place], [name], [address] Phone: [phone]   Fax:  [phone]  Name: Pamela Stein MRN: [id number] Date of Birth: 04-21-1955

## 2019-06-28 ENCOUNTER — Ambulatory Visit (HOSPITAL_COMMUNITY): Payer: Federal, State, Local not specified - PPO | Admitting: Physical Therapy

## 2019-06-28 DIAGNOSIS — Z9181 History of falling: Secondary | ICD-10-CM | POA: Diagnosis not present

## 2019-06-28 DIAGNOSIS — R2689 Other abnormalities of gait and mobility: Secondary | ICD-10-CM

## 2019-06-28 DIAGNOSIS — M6281 Muscle weakness (generalized): Secondary | ICD-10-CM | POA: Diagnosis not present

## 2019-06-28 NOTE — Therapy (Signed)
[place], [name], [address] Phone: [phone]   Fax:  [phone]  Physical Therapy Treatment  Patient Details  Name: Pamela Stein MRN: [id number] Date of Birth: [birth year] Referring Provider (PT): Phillips Odor   Encounter Date: 06/28/2019  PT End of Session - 06/28/19 1009    Visit Number  8    Number of Visits  12    Date for PT Re-Evaluation  07/18/19    Authorization Type  BCBS    Authorization - Visit Number  7    Authorization - Number of Visits  50    Progress Note Due on Visit  10    PT Start Time  0932    PT Stop Time  1002    PT Time Calculation (min)  30 min    Activity Tolerance  Patient tolerated treatment well    Behavior During Therapy  [facility] for tasks assessed/performed       Past Medical History:  Diagnosis Date  . Anxiety   . Back pain   . Bronchitis   . Complication of anesthesia    stopped breathing after endometrial ablation procedure prior to her being diagn. with OSA  . Diabetes mellitus   . Dizziness   . Heart murmur   . History of gout   . Hyperlipidemia   . Hypertension   . Neuropathy   . Obesity   . Obstructive sleep apnea    CPAP    Past Surgical History:  Procedure Laterality Date  . ABDOMINAL HYSTERECTOMY    . ADENOIDECTOMY    . CATARACT EXTRACTION Left    right 02/2018  . COLONOSCOPY  2008   diminutive rectal polyp, s/p removal. polypoid mucosa  . COLONOSCOPY WITH PROPOFOL N/A 01/03/2018   Procedure: COLONOSCOPY WITH PROPOFOL;  Surgeon: Daneil Dolin, MD;  [facility];  Service: Endoscopy;  Laterality: N/A;  12:00pm  . DILATION AND CURETTAGE OF UTERUS    . ENDOMETRIAL ABLATION    . EYE SURGERY Left 12/04/2013   cataract  . PARATHYROIDECTOMY N/A 12/22/2016   Procedure: PARATHYROIDECTOMY, NECK EXPLORATION;  Surgeon: Jackolyn Confer, MD;  Location: [facility];  Service: General;  Laterality: N/A;  . TONSILLECTOMY      There were no vitals  filed for this visit.  Subjective Assessment - 06/28/19 0935    Subjective  pt was 15 minutes late.  STates she rubbed some "diabetic cream" she got from [place] that she typically uses on her feet and it has helped some.  Currently pain is 2/10.    Currently in Pain?  Yes    Pain Score  2     Pain Location  Back    Pain Orientation  Lower    Pain Descriptors / Indicators  Aching                        [facility] Adult PT Treatment/Exercise - 06/28/19 0001      Lumbar Exercises: Standing   Forward Lunge  15 reps    Forward Lunge Limitations  onto 4" step no UE for stability    Scapular Retraction  15 reps    Theraband Level (Scapular Retraction)  Level 3 (Green)    Row  15 reps    Theraband Level (Row)  Level 3 (Green)    Shoulder Extension  15 reps    Theraband Level (Shoulder Extension)  Level 3 (Green)    Other  Standing Lumbar Exercises  tandem stance with head turns 5 reps each, tandem stance with UE flexion using 1# bar 5 reps each    Other Standing Lumbar Exercises  vector stance with 1 HR 5X5" each LE; March 10x 5" alternating no HHA      Manual Therapy   Manual Therapy  Soft tissue mobilization    Manual therapy comments  done seperate from all other aspects of treatment     Soft tissue mobilization  prone to Lt lumbar/glute region to reduce pain                PT Short Term Goals - 06/07/19 0937      PT SHORT TERM GOAL #1   Title  Pt to state that Lt knee pain is no greater than 2/10 to allow pt to ambulate for 25 mintues without difficulty to allow shopping without difficulty    Time  3    Period  Weeks    Status  On-going    Target Date  06/26/19      PT SHORT TERM GOAL #2   Title  Pt to be able to single leg stance for 25" on both LE to reduce risk of falling    Time  3    Period  Weeks    Status  On-going      PT SHORT TERM GOAL #3   Title  PT LE and core strength to have increased to allow pt to be able to come sit to stand with ease  and be able to complete 8 sit to stand in a 30 second period    Time  3    Period  Weeks    Status  On-going      PT SHORT TERM GOAL #4   Title  Pt Lt knee extension to be 5 degees or less to allow a more normalized gait pattern    Time  3    Period  Weeks    Status  On-going        PT Long Term Goals - 06/07/19 [id number]      PT LONG TERM GOAL #1   Title  PT to be able to single leg stance for 45" on both LE in order to have no falls in the past three weeks.    Time  6    Period  Weeks    Status  On-going      PT LONG TERM GOAL #2   Title  Pt  LE and core strength to increase to allow pt to be able to ascend and descend 8 steps in a reciprocal manner with one handrail without difficulty.    Time  6    Period  Weeks    Status  On-going      PT LONG TERM GOAL #3   Title  Pt Lt knee extension to be 3 or less and gait to be normalized by increasing ankle and knee flexion while ambulating    Time  6    Period  Weeks    Status  On-going      PT LONG TERM GOAL #4   Title  PT to be able to walk for 60 mintues or more without difficulty to allow pt to shop without hesitation.    Time  6    Period  Weeks    Status  On-going            Plan - 06/28/19 1012  Clinical Impression Statement  PT late for appt so unable to complete all exercises/activities.  continued with focus on core/lumbar/LE stability and postural strengthening.  Pt able to complete all exercises without complaints of pain.  Added UE flexion with 1# bar while in tandem to further increase challenge.  More difficult with Rt leading.  Continued global tightness in glute/lumbar musculature.    Personal Factors and Comorbidities  Comorbidity 1;Fitness;Past/Current Experience    Comorbidities  see MRI    Examination-Activity Limitations  Carry;Lift;Locomotion Level;Stairs;Stand    Examination-Participation Restrictions  Cleaning;Community Activity;Laundry;Shop    Stability/Clinical Decision Making  Evolving/Moderate  complexity    Rehab Potential  Good    PT Frequency  2x / week    PT Duration  6 weeks    PT Treatment/Interventions  ADLs/Self Care Home Management;Therapeutic exercise;Therapeutic activities;Balance training;Neuromuscular re-education;Patient/family education;Manual techniques;Gait training;Stair training;Functional mobility training;Aquatic Therapy    PT Next Visit Plan  Continue to progress towards goals.  complete manual as needed for pain.  Progress balalnce and strengthening.    PT Home Exercise Plan  scapular and cervical retraction, Lt LAQ, Lt Quad set, bridge.; 5: 12:  sit to stand, heel raises, functional squat and lunges       Patient will benefit from skilled therapeutic intervention in order to improve the following deficits and impairments:  Abnormal gait, Decreased activity tolerance, Decreased balance, Decreased coordination, Decreased range of motion, Decreased strength, Difficulty walking, Pain  Visit Diagnosis: Muscle weakness (generalized)  Other abnormalities of gait and mobility  History of falling     Problem List Patient Active Problem List   Diagnosis Date Noted  . Polyneuropathy due to secondary diabetes mellitus ([place]) 05/25/2019  . Arthritis of right sacroiliac joint 05/25/2019  . Cervical disc disorder 05/25/2019  . Falls 05/25/2019  . General unsteadiness 05/25/2019  . Encounter for well woman exam with routine gynecological exam 01/09/2019  . Screening for colorectal cancer 01/09/2019  . Encounter for screening colonoscopy 12/13/2017  . Hyperparathyroidism, primary ([place]) 12/22/2016  . Vitamin D deficiency 10/31/2015  . Hyperuricemia 05/08/2013  . ONYCHOMYCOSIS, TOENAILS 02/22/2009  . NEVI, MULTIPLE 02/22/2009  . Diabetes mellitus ([place]) 01/25/2008  . BACK PAIN WITH RADICULOPATHY 11/09/2007  . Hyperlipidemia LDL goal <100 08/26/2007  . Morbid obesity ([place]) 08/26/2007  . Essential hypertension 08/26/2007  . Obstructive sleep apnea 04/20/2007    Teena Irani, PTA/CLT [phone]  Teena Irani 06/28/2019, 10:18 AM  [place], [name], [address] Phone: [phone]   Fax:  [phone]  Name: SIMONETTA PATIENCE MRN: [id number] Date of Birth: Nov 23, 1955

## 2019-07-03 ENCOUNTER — Encounter (HOSPITAL_COMMUNITY): Payer: Self-pay | Admitting: Physical Therapy

## 2019-07-03 ENCOUNTER — Other Ambulatory Visit: Payer: Self-pay

## 2019-07-03 ENCOUNTER — Ambulatory Visit (HOSPITAL_COMMUNITY): Payer: Federal, State, Local not specified - PPO | Admitting: Physical Therapy

## 2019-07-03 DIAGNOSIS — Z9181 History of falling: Secondary | ICD-10-CM | POA: Diagnosis not present

## 2019-07-03 DIAGNOSIS — R2689 Other abnormalities of gait and mobility: Secondary | ICD-10-CM

## 2019-07-03 DIAGNOSIS — M6281 Muscle weakness (generalized): Secondary | ICD-10-CM | POA: Diagnosis not present

## 2019-07-03 NOTE — Therapy (Signed)
[place], [name], [address] Phone: [phone]   Fax:  [phone]  Physical Therapy Treatment/ Progress Note  Patient Details  Name: Pamela Stein MRN: [id number] Date of Birth: 11-12-1955 Referring Provider (PT): Phillips Odor   Encounter Date: 07/03/2019   Progress Note Reporting Period 06/05/19 to 07/03/19  See note below for Objective Data and Assessment of Progress/Goals.       PT End of Session - 07/03/19 1006    Visit Number  9    Number of Visits  16    Date for PT Re-Evaluation  07/28/19    Authorization Type  BCBS    Authorization - Visit Number  8    Authorization - Number of Visits  50    Progress Note Due on Visit  16    PT Start Time  8127   arrived late   PT Stop Time  1030    PT Time Calculation (min)  35 min    Activity Tolerance  Patient tolerated treatment well    Behavior During Therapy  WFL for tasks assessed/performed       Past Medical History:  Diagnosis Date  . Anxiety   . Back pain   . Bronchitis   . Complication of anesthesia    stopped breathing after endometrial ablation procedure prior to her being diagn. with OSA  . Diabetes mellitus   . Dizziness   . Heart murmur   . History of gout   . Hyperlipidemia   . Hypertension   . Neuropathy   . Obesity   . Obstructive sleep apnea    CPAP    Past Surgical History:  Procedure Laterality Date  . ABDOMINAL HYSTERECTOMY    . ADENOIDECTOMY    . CATARACT EXTRACTION Left    right 02/2018  . COLONOSCOPY  2008   diminutive rectal polyp, s/p removal. polypoid mucosa  . COLONOSCOPY WITH PROPOFOL N/A 01/03/2018   Procedure: COLONOSCOPY WITH PROPOFOL;  Surgeon: Daneil Dolin, MD;  [facility];  Service: Endoscopy;  Laterality: N/A;  12:00pm  . DILATION AND CURETTAGE OF UTERUS    . ENDOMETRIAL ABLATION    . EYE SURGERY Left 12/04/2013   cataract  . PARATHYROIDECTOMY N/A 12/22/2016   Procedure:  PARATHYROIDECTOMY, NECK EXPLORATION;  Surgeon: Jackolyn Confer, MD;  [facility];  Service: General;  Laterality: N/A;  . TONSILLECTOMY      There were no vitals filed for this visit.  Subjective Assessment - 07/03/19 1001    Subjective  Patients says she feels she is doing a lot better with her balance and her knee pain. Says she has been able to stand for up to 1-1.5 hours. Says she does still have issues with uneven surfaces. She does note that she fell over the weekend, says she tripped over a hole that her dog dug in the yard that had covered with grass that she did not see. Says she fell on her LT side reports no injury, just soreness. Patient says she feels she is about 95% better since starting therapy.    Pertinent History  Back pain, Lt knee pain, HTN    Limitations  Standing;Lifting;Walking;House hold activities    How long can you stand comfortably?  1-1.5 hours    How long can you walk comfortably?  1 hour at grocery store    Patient Stated Goals  To walk better, stop falling and less knee pain, be safer going  up and down steps    Currently in Pain?  Yes    Pain Score  2     Pain Location  Knee    Pain Orientation  Anterior;Left    Pain Descriptors / Indicators  Dull    Pain Type  Acute pain    Pain Onset  More than a month ago    Pain Frequency  Intermittent    Aggravating Factors   walking on uneven surface, walking down stairs    Pain Relieving Factors  rest/ non WB, neuropathy cream    Effect of Pain on Daily Activities  Limits         [facility] PT Assessment - 07/03/19 0001      Assessment   Medical Diagnosis  History of falling     Referring Provider (PT)  Kofi [place]    Onset Date/Surgical Date  05/10/17    Next MD Visit  07/04/19    Prior Therapy  none      Precautions   Precautions  Fall      Restrictions   Weight Bearing Restrictions  No      Balance Screen   Has the patient fallen in the past 6 months  Yes    How many times?  6    Has the  patient had a decrease in activity level because of a fear of falling?   Yes    Is the patient reluctant to leave their home because of a fear of falling?   No      Home Film/video editor residence      Prior Function   Level of Independence  Independent    Vocation  Full time employment    Vocation Requirements  sit/stand at post office     Leisure  singing       Cognition   Overall Cognitive Status  Within Functional Limits for tasks assessed      ROM / Strength   AROM / PROM / Strength  AROM      AROM   AROM Assessment Site  Knee    Right/Left Knee  Right;Left    Left Knee Extension  2    Left Knee Flexion  110      Strength   Right Hip Flexion  4+/5    Left Hip Flexion  4+/5    Right Knee Flexion  5/5    Right Knee Extension  5/5    Left Knee Flexion  4+/5   was 4   Left Knee Extension  5/5   was 4   Right Ankle Dorsiflexion  5/5    Left Ankle Dorsiflexion  5/5      Transfers   Five time sit to stand comments   23 sec with no UE       Ambulation/Gait   Ambulation/Gait  --      Balance   Balance Assessed  Yes      Static Standing Balance   Static Standing Balance -  Activities   Single Leg Stance - Right Leg;Single Leg Stance - Left Leg    Static Standing - Comment/# of Minutes  15 sec; 11 sec                             PT Education - 07/03/19 1004    Education Details  on reassessment findings and POC    Person(s) Educated  Patient  Methods  Explanation    Comprehension  Verbalized understanding       PT Short Term Goals - 07/03/19 1024      PT SHORT TERM GOAL #1   Title  Pt to state that Lt knee pain is no greater than 2/10 to allow pt to ambulate for 25 mintues without difficulty to allow shopping without difficulty    Baseline  reports able to walk 1 hour, current pain 2/10    Time  3    Period  Weeks    Status  Achieved    Target Date  06/26/19      PT SHORT TERM GOAL #2   Title  Pt to be  able to single leg stance for 25" on both LE to reduce risk of falling    Baseline  current: RT 15 sec, LT 11 sec    Time  3    Period  Weeks    Status  On-going      PT SHORT TERM GOAL #3   Title  PT LE and core strength to have increased to allow pt to be able to come sit to stand with ease and be able to complete 8 sit to stand in a 30 second period    Baseline  current 23 sec    Time  3    Period  Weeks    Status  Achieved      PT SHORT TERM GOAL #4   Title  Pt Lt knee extension to be 5 degees or less to allow a more normalized gait pattern    Baseline  current: 2 degrees    Time  3    Period  Weeks    Status  Achieved        PT Long Term Goals - 07/03/19 1025      PT LONG TERM GOAL #1   Title  PT to be able to single leg stance for 45" on both LE in order to have no falls in the past three weeks.    Baseline  current: RT 15 sec, LT 11 sec    Time  6    Period  Weeks    Status  On-going      PT LONG TERM GOAL #2   Title  Pt  LE and core strength to increase to allow pt to be able to ascend and descend 8 steps in a reciprocal manner with one handrail without difficulty.    Baseline  Able to perform well with ascent, some weakness noted with descent, poor control with reciprocal pattern    Time  6    Period  Weeks    Status  Partially Met      PT LONG TERM GOAL #3   Title  Pt Lt knee extension to be 3 or less and gait to be normalized by increasing ankle and knee flexion while ambulating    Baseline  Current: 2 degrees    Time  6    Period  Weeks    Status  Achieved      PT LONG TERM GOAL #4   Title  PT to be able to walk for 60 mintues or more without difficulty to allow pt to shop without hesitation.    Baseline  Reports able to walk at grocery store for about 1 hour    Time  6    Period  Weeks    Status  Achieved  Plan - 07/03/19 1033    Clinical Impression Statement  Patient is making good progress toward therapy goals. Patient currently  with  short term and  long term goals met/ partially met. Patient is still limited by functional weakness with stair ambulation, and ongoing balance deficits which continue to negatively impact functional ability. Patient will continue to benefit from skilled therapy services to address remaining deficits to reduce pain, improve functional mobility and reduce risk for future falls.    Personal Factors and Comorbidities  Comorbidity 1;Fitness;Past/Current Experience    Comorbidities  see MRI    Examination-Activity Limitations  Carry;Lift;Locomotion Level;Stairs;Stand    Examination-Participation Restrictions  Cleaning;Community Activity;Laundry;Shop    Stability/Clinical Decision Making  Evolving/Moderate complexity    Rehab Potential  Good    PT Frequency  2x / week    PT Duration  4 weeks    PT Treatment/Interventions  ADLs/Self Care Home Management;Therapeutic exercise;Therapeutic activities;Balance training;Neuromuscular re-education;Patient/family education;Manual techniques;Gait training;Stair training;Functional mobility training;Aquatic Therapy    PT Next Visit Plan  Continue to progress towards goals.  complete manual as needed for pain.  Progress balalnce and strengthening. Print HEP next visit    PT Home Exercise Plan  scapular and cervical retraction, Lt LAQ, Lt Quad set, bridge.; 5: 12:  sit to stand, heel raises, functional squat and lunges    Consulted and Agree with Plan of Care  Patient       Patient will benefit from skilled therapeutic intervention in order to improve the following deficits and impairments:  Abnormal gait, Decreased activity tolerance, Decreased balance, Decreased coordination, Decreased range of motion, Decreased strength, Difficulty walking, Pain  Visit Diagnosis: Muscle weakness (generalized)  Other abnormalities of gait and mobility  History of falling     Problem List Patient Active Problem List   Diagnosis Date Noted  . Polyneuropathy due to  secondary diabetes mellitus ([place]) 05/25/2019  . Arthritis of right sacroiliac joint 05/25/2019  . Cervical disc disorder 05/25/2019  . Falls 05/25/2019  . General unsteadiness 05/25/2019  . Encounter for well woman exam with routine gynecological exam 01/09/2019  . Screening for colorectal cancer 01/09/2019  . Encounter for screening colonoscopy 12/13/2017  . Hyperparathyroidism, primary ([place]) 12/22/2016  . Vitamin D deficiency 10/31/2015  . Hyperuricemia 05/08/2013  . ONYCHOMYCOSIS, TOENAILS 02/22/2009  . NEVI, MULTIPLE 02/22/2009  . Diabetes mellitus ([place]) 01/25/2008  . BACK PAIN WITH RADICULOPATHY 11/09/2007  . Hyperlipidemia LDL goal <100 08/26/2007  . Morbid obesity ([place]) 08/26/2007  . Essential hypertension 08/26/2007  . Obstructive sleep apnea 04/20/2007   10:37 AM, 07/03/19 Josue Hector PT DPT  Physical Therapist with [facility]  [phone] [address] [place], [name], [address] Phone: [phone]   Fax:  [phone]  Name: Pamela Stein MRN: [id number] Date of Birth: 08/19/1955

## 2019-07-04 ENCOUNTER — Encounter: Payer: Self-pay | Admitting: Orthopaedic Surgery

## 2019-07-04 ENCOUNTER — Ambulatory Visit: Payer: Federal, State, Local not specified - PPO | Admitting: Orthopaedic Surgery

## 2019-07-04 VITALS — BP 143/80 | HR 65 | Ht 62.5 in | Wt 222.1 lb

## 2019-07-04 DIAGNOSIS — G8929 Other chronic pain: Secondary | ICD-10-CM

## 2019-07-04 DIAGNOSIS — M25562 Pain in left knee: Secondary | ICD-10-CM | POA: Diagnosis not present

## 2019-07-04 DIAGNOSIS — M25561 Pain in right knee: Secondary | ICD-10-CM

## 2019-07-04 NOTE — Progress Notes (Signed)
Patient [id number] Pamela Stein, female DOB:1955/09/30, 64 y.o. [id number]  Chief Complaint  Patient presents with  . Knee Pain    L/doing better/fell on 06/30/19 landed on left side    HPI  Pamela Stein is a 64 y.o. female who has pain of both knees, but she is better. She has been to PT and is slowly improving.  She still has popping and swelling but pain is less. She has no new trauma.   Body mass index is 39.98 kg/m.  ROS  Review of Systems  Constitutional: Positive for activity change.  Musculoskeletal: Positive for arthralgias, back pain, gait problem and joint swelling.  Psychiatric/Behavioral: The patient is nervous/anxious.   All other systems reviewed and are negative.   All other systems reviewed and are negative.  The following is a summary of the past history medically, past history surgically, known current medicines, social history and family history.  This information is gathered electronically by the computer from prior information and documentation.  I review this each visit and have found including this information at this point in the chart is beneficial and informative.    Past Medical History:  Diagnosis Date  . Anxiety   . Back pain   . Bronchitis   . Complication of anesthesia    stopped breathing after endometrial ablation procedure prior to her being diagn. with OSA  . Diabetes mellitus   . Dizziness   . Heart murmur   . History of gout   . Hyperlipidemia   . Hypertension   . Neuropathy   . Obesity   . Obstructive sleep apnea    CPAP    Past Surgical History:  Procedure Laterality Date  . ABDOMINAL HYSTERECTOMY    . ADENOIDECTOMY    . CATARACT EXTRACTION Left    right 02/2018  . COLONOSCOPY  2008   diminutive rectal polyp, s/p removal. polypoid mucosa  . COLONOSCOPY WITH PROPOFOL N/A 01/03/2018   Procedure: COLONOSCOPY WITH PROPOFOL;  Surgeon: Daneil Dolin, MD;  [facility];  Service: Endoscopy;  Laterality: N/A;   12:00pm  . DILATION AND CURETTAGE OF UTERUS    . ENDOMETRIAL ABLATION    . EYE SURGERY Left 12/04/2013   cataract  . PARATHYROIDECTOMY N/A 12/22/2016   Procedure: PARATHYROIDECTOMY, NECK EXPLORATION;  Surgeon: Jackolyn Confer, MD;  Location: [facility];  Service: General;  Laterality: N/A;  . TONSILLECTOMY      Family History  Problem Relation Age of Onset  . Cancer Father        throat  . Throat cancer Father   . Hypertension Father   . Diabetes Father   . Diabetes Maternal Grandmother   . Hypertension Maternal Grandfather   . Hypertension Paternal Grandmother   . Diabetes Paternal Grandfather   . Hypertension Mother   . Diabetes Mother   . Cancer Paternal Aunt   . Colon cancer Neg Hx   . Colon polyps Neg Hx     Social History Social History   Tobacco Use  . Smoking status: Never Smoker  . Smokeless tobacco: Never Used  Substance Use Topics  . Alcohol use: No    Alcohol/week: 0.0 standard drinks  . Drug use: No    Allergies  Allergen Reactions  . Ace Inhibitors Cough    Current Outpatient Medications  Medication Sig Dispense Refill  . albuterol (PROVENTIL HFA;VENTOLIN HFA) 108 (90 Base) MCG/ACT inhaler Inhale 1-2 puffs into the lungs every 6 (six) hours as needed for wheezing or  shortness of breath. 1 Inhaler 0  . amLODipine (NORVASC) 10 MG tablet TAKE 1 TABLET BY MOUTH DAILY (Patient taking differently: Take 10 mg by mouth daily. ) 30 tablet 4  . aspirin EC 81 MG tablet Take 81 mg by mouth daily.    . DULoxetine (CYMBALTA) 60 MG capsule Take 1 capsule every day by oral route.    . furosemide (LASIX) 20 MG tablet Take 20 mg by mouth daily.   3  . gabapentin (NEURONTIN) 300 MG capsule Take 1 capsule (300 mg total) by mouth 3 (three) times daily. (Patient taking differently: Take 300 mg by mouth daily. ) 90 capsule 3  . losartan (COZAAR) 25 MG tablet Take 25 mg by mouth daily.   3  . metFORMIN (GLUCOPHAGE) 500 MG tablet TAKE 1 TABLET BY MOUTH TWICE DAILY WITH A MEAL  (Patient taking differently: Take 500 mg by mouth daily with lunch. ) 180 tablet 1  . pravastatin (PRAVACHOL) 80 MG tablet TAKE 1 TABLET(80 MG) BY MOUTH EVERY EVENING (Patient taking differently: Take 80 mg by mouth every evening. ) 90 tablet 0  . spironolactone (ALDACTONE) 25 MG tablet Take 25 mg by mouth daily.   3   No current facility-administered medications for this visit.     Physical Exam  Blood pressure (!) 143/80, pulse 65, height 5' 2.5" (1.588 m), weight 222 lb 2 oz (100.8 kg).  Constitutional: overall normal hygiene, normal nutrition, well developed, normal grooming, normal body habitus. Assistive device:none  Musculoskeletal: gait and station Limp left, muscle tone and strength are normal, no tremors or atrophy is present.  .  Neurological: coordination overall normal.  Deep tendon reflex/nerve stretch intact.  Sensation normal.  Cranial nerves II-XII intact.   Skin:   Normal overall no scars, lesions, ulcers or rashes. No psoriasis.  Psychiatric: Alert and oriented x 3.  Recent memory intact, remote memory unclear.  Normal mood and affect. Well groomed.  Good eye contact.  Cardiovascular: overall no swelling, no varicosities, no edema bilaterally, normal temperatures of the legs and arms, no clubbing, cyanosis and good capillary refill.  Lymphatic: palpation is normal.  Both knees are tender, more on the left, both have some effusion and crepitus, ROM left 0 to 105, right 0 to 110.  Limp left.  NV intact.  All other systems reviewed and are negative   The patient has been educated about the nature of the problem(s) and counseled on treatment options.  The patient appeared to understand what I have discussed and is in agreement with it.  Encounter Diagnoses  Name Primary?  . Chronic pain of left knee Yes  . Chronic pain of right knee     PLAN Call if any problems.  Precautions discussed.  Continue current medications.   Return to clinic 2 weeks   Keep up  PT.  Electronically Signed Sanjuana Kava, MD 5/25/202110:53 AM

## 2019-07-05 ENCOUNTER — Encounter (HOSPITAL_COMMUNITY): Payer: Self-pay | Admitting: Physical Therapy

## 2019-07-05 ENCOUNTER — Other Ambulatory Visit: Payer: Self-pay

## 2019-07-05 ENCOUNTER — Ambulatory Visit (HOSPITAL_COMMUNITY): Payer: Federal, State, Local not specified - PPO | Admitting: Physical Therapy

## 2019-07-05 DIAGNOSIS — Z9181 History of falling: Secondary | ICD-10-CM | POA: Diagnosis not present

## 2019-07-05 DIAGNOSIS — R2689 Other abnormalities of gait and mobility: Secondary | ICD-10-CM | POA: Diagnosis not present

## 2019-07-05 DIAGNOSIS — M6281 Muscle weakness (generalized): Secondary | ICD-10-CM | POA: Diagnosis not present

## 2019-07-05 NOTE — Therapy (Signed)
[place], [name], [address] Phone: [phone]   Fax:  [phone]  Physical Therapy Treatment  Patient Details  Name: Pamela Stein MRN: [id number] Date of Birth: 04-15-1955 Referring Provider (PT): Phillips Odor   Encounter Date: 07/05/2019  PT End of Session - 07/05/19 0940    Visit Number  10    Number of Visits  16    Date for PT Re-Evaluation  07/28/19    Authorization Type  BCBS    Authorization - Visit Number  10    Authorization - Number of Visits  50    Progress Note Due on Visit  16    PT Start Time  0915    PT Stop Time  0955    PT Time Calculation (min)  40 min    Activity Tolerance  Patient tolerated treatment well    Behavior During Therapy  [facility] for tasks assessed/performed       Past Medical History:  Diagnosis Date  . Anxiety   . Back pain   . Bronchitis   . Complication of anesthesia    stopped breathing after endometrial ablation procedure prior to her being diagn. with OSA  . Diabetes mellitus   . Dizziness   . Heart murmur   . History of gout   . Hyperlipidemia   . Hypertension   . Neuropathy   . Obesity   . Obstructive sleep apnea    CPAP    Past Surgical History:  Procedure Laterality Date  . ABDOMINAL HYSTERECTOMY    . ADENOIDECTOMY    . CATARACT EXTRACTION Left    right 02/2018  . COLONOSCOPY  2008   diminutive rectal polyp, s/p removal. polypoid mucosa  . COLONOSCOPY WITH PROPOFOL N/A 01/03/2018   Procedure: COLONOSCOPY WITH PROPOFOL;  Surgeon: Daneil Dolin, MD;  [facility];  Service: Endoscopy;  Laterality: N/A;  12:00pm  . DILATION AND CURETTAGE OF UTERUS    . ENDOMETRIAL ABLATION    . EYE SURGERY Left 12/04/2013   cataract  . PARATHYROIDECTOMY N/A 12/22/2016   Procedure: PARATHYROIDECTOMY, NECK EXPLORATION;  Surgeon: Jackolyn Confer, MD;  Location[facility];  Service: General;  Laterality: N/A;  . TONSILLECTOMY      There were no  vitals filed for this visit.  Subjective Assessment - 07/05/19 0916    Subjective  PT states that her knees bothered her at work  as she had to get up and down from her desk quite a bit l.    Pertinent History  Back pain, Lt knee pain, HTN    Limitations  Standing;Lifting;Walking;House hold activities    How long can you sit comfortably?  no problem    How long can you stand comfortably?  1-1.5 hours    How long can you walk comfortably?  1 hour at grocery store    Diagnostic tests  IMPRESSION:1. Multilevel degenerative changes of the cervical spine withposterior disc protrusions at C3-4 through C5-6 causing flatteningof the anterior surface of the spinal cord and resulting in mildspinal canal stenosis at C3-4 and C4-5. No spinal cord signalabnormality.2. No high-grade neural foraminal narrowing.3. A 4 mm T1 hyperintense, T2 hypointense structure within thepituitary gland, just posterior to the pituitary stalk, likelyrepresenting a Rathke's cleft cyst.    Patient Stated Goals  To walk better, stop falling and less knee pain, be safer going up and down steps    Currently in Pain?  Yes    Pain  Score  3     Pain Location  Knee    Pain Orientation  Left    Pain Descriptors / Indicators  Aching    Pain Type  Chronic pain    Pain Onset  More than a month ago    Aggravating Factors   up and down    Pain Relieving Factors  cream              [facility] Adult PT Treatment/Exercise - 07/05/19 0001      Neck Exercises: Standing   Other Standing Exercises  with t band side stepping 2RT      Lumbar Exercises: Standing   Wall Slides  5 reps    Scapular Retraction  15 reps    Theraband Level (Scapular Retraction)  Level 3 (Green)    Row  15 reps    Theraband Level (Row)  Level 3 (Green)    Shoulder Extension  15 reps    Theraband Level (Shoulder Extension)  Level 3 (Green)    Other Standing Lumbar Exercises  tandem stance with head turns 5 reps each, tandem stance with UE flexion using 1# bar 5  reps each    Other Standing Lumbar Exercises  vector stance  5X5" each LE; March 10x 5" alternating no HHA      Lumbar Exercises: Seated   Sit to Stand  15 reps               PT Short Term Goals - 07/03/19 1024      PT SHORT TERM GOAL #1   Title  Pt to state that Lt knee pain is no greater than 2/10 to allow pt to ambulate for 25 mintues without difficulty to allow shopping without difficulty    Baseline  reports able to walk 1 hour, current pain 2/10    Time  3    Period  Weeks    Status  Achieved    Target Date  06/26/19      PT SHORT TERM GOAL #2   Title  Pt to be able to single leg stance for 25" on both LE to reduce risk of falling    Baseline  current: RT 15 sec, LT 11 sec    Time  3    Period  Weeks    Status  On-going      PT SHORT TERM GOAL #3   Title  PT LE and core strength to have increased to allow pt to be able to come sit to stand with ease and be able to complete 8 sit to stand in a 30 second period    Baseline  current 23 sec    Time  3    Period  Weeks    Status  Achieved      PT SHORT TERM GOAL #4   Title  Pt Lt knee extension to be 5 degees or less to allow a more normalized gait pattern    Baseline  current: 2 degrees    Time  3    Period  Weeks    Status  Achieved        PT Long Term Goals - 07/03/19 1025      PT LONG TERM GOAL #1   Title  PT to be able to single leg stance for 45" on both LE in order to have no falls in the past three weeks.    Baseline  current: RT 15 sec, LT 11 sec  Time  6    Period  Weeks    Status  On-going      PT LONG TERM GOAL #2   Title  Pt  LE and core strength to increase to allow pt to be able to ascend and descend 8 steps in a reciprocal manner with one handrail without difficulty.    Baseline  Able to perform well with ascent, some weakness noted with descent, poor control with reciprocal pattern    Time  6    Period  Weeks    Status  Partially Met      PT LONG TERM GOAL #3   Title  Pt Lt knee  extension to be 3 or less and gait to be normalized by increasing ankle and knee flexion while ambulating    Baseline  Current: 2 degrees    Time  6    Period  Weeks    Status  Achieved      PT LONG TERM GOAL #4   Title  PT to be able to walk for 60 mintues or more without difficulty to allow pt to shop without hesitation.    Baseline  Reports able to walk at grocery store for about 1 hour    Time  6    Period  Weeks    Status  Achieved            Plan - 07/05/19 0941    Clinical Impression Statement  PT concerned about her increased pain today, explained that rehab is not necessarily getting better every day but rather if the general progression is positive,  Advanced vector stances with no hand held, wall slides as  well as gave pt HEP for tband postural exercoses    Personal Factors and Comorbidities  Comorbidity 1;Fitness;Past/Current Experience    Comorbidities  see MRI    Examination-Activity Limitations  Carry;Lift;Locomotion Level;Stairs;Stand    Examination-Participation Restrictions  Cleaning;Community Activity;Laundry;Shop    Stability/Clinical Decision Making  Evolving/Moderate complexity    Rehab Potential  Good    PT Frequency  2x / week    PT Duration  4 weeks    PT Treatment/Interventions  ADLs/Self Care Home Management;Therapeutic exercise;Therapeutic activities;Balance training;Neuromuscular re-education;Patient/family education;Manual techniques;Gait training;Stair training;Functional mobility training;Aquatic Therapy    PT Next Visit Plan  Continue to progress towards goals.  complete manual as needed for pain.  Progress balalnce and strengthening.    PT Home Exercise Plan  scapular and cervical retraction, Lt LAQ, Lt Quad set, bridge.; 5: 12:  sit to stand, heel raises, functional squat and lunges; 5/26: tband postural exercises . wall squats    Consulted and Agree with Plan of Care  Patient       Patient will benefit from skilled therapeutic intervention in  order to improve the following deficits and impairments:  Abnormal gait, Decreased activity tolerance, Decreased balance, Decreased coordination, Decreased range of motion, Decreased strength, Difficulty walking, Pain  Visit Diagnosis: Muscle weakness (generalized)  Other abnormalities of gait and mobility     Problem List Patient Active Problem List   Diagnosis Date Noted  . Polyneuropathy due to secondary diabetes mellitus ([place]) 05/25/2019  . Arthritis of right sacroiliac joint 05/25/2019  . Cervical disc disorder 05/25/2019  . Falls 05/25/2019  . General unsteadiness 05/25/2019  . Encounter for well woman exam with routine gynecological exam 01/09/2019  . Screening for colorectal cancer 01/09/2019  . Encounter for screening colonoscopy 12/13/2017  . Hyperparathyroidism, primary ([place]) 12/22/2016  . Vitamin D deficiency 10/31/2015  .  Hyperuricemia 05/08/2013  . ONYCHOMYCOSIS, TOENAILS 02/22/2009  . NEVI, MULTIPLE 02/22/2009  . Diabetes mellitus ([place]) 01/25/2008  . BACK PAIN WITH RADICULOPATHY 11/09/2007  . Hyperlipidemia LDL goal <100 08/26/2007  . Morbid obesity ([place]) 08/26/2007  . Essential hypertension 08/26/2007  . Obstructive sleep apnea 04/20/2007    Rayetta Humphrey, PT CLT [phone] 07/05/2019, 10:01 AM  [place] [address] [place], [name], [address] Phone: [phone]   Fax:  [phone]  Name: KADEJA GRANADA MRN: [id number] Date of Birth: 31-Dec-1955

## 2019-07-11 ENCOUNTER — Encounter (HOSPITAL_COMMUNITY): Payer: Self-pay | Admitting: Physical Therapy

## 2019-07-11 ENCOUNTER — Ambulatory Visit (HOSPITAL_COMMUNITY): Payer: Federal, State, Local not specified - PPO | Attending: Neurology | Admitting: Physical Therapy

## 2019-07-11 ENCOUNTER — Other Ambulatory Visit: Payer: Self-pay

## 2019-07-11 DIAGNOSIS — M6281 Muscle weakness (generalized): Secondary | ICD-10-CM | POA: Insufficient documentation

## 2019-07-11 DIAGNOSIS — R2689 Other abnormalities of gait and mobility: Secondary | ICD-10-CM | POA: Diagnosis not present

## 2019-07-11 DIAGNOSIS — Z9181 History of falling: Secondary | ICD-10-CM | POA: Diagnosis not present

## 2019-07-11 NOTE — Therapy (Signed)
[place], [name], [address] Phone: [phone]   Fax:  [phone]  Physical Therapy Treatment  Patient Details  Name: Pamela Stein MRN: [id number] Date of Birth: Oct 27, 1955 Referring Provider (PT): Phillips Odor   Encounter Date: 07/11/2019  PT End of Session - 07/11/19 0959    Visit Number  11    Number of Visits  16    Date for PT Re-Evaluation  07/28/19    Authorization Type  BCBS    Authorization - Visit Number  11    Authorization - Number of Visits  50    Progress Note Due on Visit  16    Activity Tolerance  Patient tolerated treatment well    Behavior During Therapy  [facility] for tasks assessed/performed       Past Medical History:  Diagnosis Date  . Anxiety   . Back pain   . Bronchitis   . Complication of anesthesia    stopped breathing after endometrial ablation procedure prior to her being diagn. with OSA  . Diabetes mellitus   . Dizziness   . Heart murmur   . History of gout   . Hyperlipidemia   . Hypertension   . Neuropathy   . Obesity   . Obstructive sleep apnea    CPAP    Past Surgical History:  Procedure Laterality Date  . ABDOMINAL HYSTERECTOMY    . ADENOIDECTOMY    . CATARACT EXTRACTION Left    right 02/2018  . COLONOSCOPY  2008   diminutive rectal polyp, s/p removal. polypoid mucosa  . COLONOSCOPY WITH PROPOFOL N/A 01/03/2018   Procedure: COLONOSCOPY WITH PROPOFOL;  Surgeon: Daneil Dolin, MD;  [facility];  Service: Endoscopy;  Laterality: N/A;  12:00pm  . DILATION AND CURETTAGE OF UTERUS    . ENDOMETRIAL ABLATION    . EYE SURGERY Left 12/04/2013   cataract  . PARATHYROIDECTOMY N/A 12/22/2016   Procedure: PARATHYROIDECTOMY, NECK EXPLORATION;  Surgeon: Jackolyn Confer, MD;  [facility];  Service: General;  Laterality: N/A;  . TONSILLECTOMY      There were no vitals filed for this visit.  Subjective Assessment - 07/11/19 0939    Subjective  Pt  states that she has been doing her exercises only pain at this time is her knees    Pertinent History  Back pain, Lt knee pain, HTN    Limitations  Standing;Lifting;Walking;House hold activities    How long can you sit comfortably?  no problem    How long can you stand comfortably?  1-1.5 hours    How long can you walk comfortably?  1 hour at grocery store    Diagnostic tests  IMPRESSION:1. Multilevel degenerative changes of the cervical spine withposterior disc protrusions at C3-4 through C5-6 causing flatteningof the anterior surface of the spinal cord and resulting in mildspinal canal stenosis at C3-4 and C4-5. No spinal cord signalabnormality.2. No high-grade neural foraminal narrowing.3. A 4 mm T1 hyperintense, T2 hypointense structure within thepituitary gland, just posterior to the pituitary stalk, likelyrepresenting a Rathke's cleft cyst.    Patient Stated Goals  To walk better, stop falling and less knee pain, be safer going up and down steps    Currently in Pain?  Yes    Pain Score  3     Pain Location  Knee    Pain Orientation  Right;Left    Pain Descriptors / Indicators  Aching    Pain Type  Chronic  pain    Pain Onset  More than a month ago    Aggravating Factors   steps    Pain Relieving Factors  voltaran             [facility] Adult PT Treatment/Exercise - 07/11/19 0001      Lumbar Exercises: Standing   Other Standing Lumbar Exercises  vector stances x 10"       Lumbar Exercises: Seated   Sit to Stand  15 reps          Balance Exercises - 07/11/19 0941      Balance Exercises: Standing   Tandem Stance  Eyes open;Other reps (comment)   using 2# dowel flexion and protraction, retraction x 10 each   Tandem Gait  2 reps    Retro Gait  2 reps    Marching  10 reps   opposite arm into flexion as with dead bug    Heel Raises  10 reps    Sit to Stand  Time;Limitations    Sit to Stand Time  10    Other Standing Exercises  wall arch, wall slides x 10 each            PT Short Term Goals - 07/03/19 1024      PT SHORT TERM GOAL #1   Title  Pt to state that Lt knee pain is no greater than 2/10 to allow pt to ambulate for 25 mintues without difficulty to allow shopping without difficulty    Baseline  reports able to walk 1 hour, current pain 2/10    Time  3    Period  Weeks    Status  Achieved    Target Date  06/26/19      PT SHORT TERM GOAL #2   Title  Pt to be able to single leg stance for 25" on both LE to reduce risk of falling    Baseline  current: RT 15 sec, LT 11 sec    Time  3    Period  Weeks    Status  On-going      PT SHORT TERM GOAL #3   Title  PT LE and core strength to have increased to allow pt to be able to come sit to stand with ease and be able to complete 8 sit to stand in a 30 second period    Baseline  current 23 sec    Time  3    Period  Weeks    Status  Achieved      PT SHORT TERM GOAL #4   Title  Pt Lt knee extension to be 5 degees or less to allow a more normalized gait pattern    Baseline  current: 2 degrees    Time  3    Period  Weeks    Status  Achieved        PT Long Term Goals - 07/03/19 1025      PT LONG TERM GOAL #1   Title  PT to be able to single leg stance for 45" on both LE in order to have no falls in the past three weeks.    Baseline  current: RT 15 sec, LT 11 sec    Time  6    Period  Weeks    Status  On-going      PT LONG TERM GOAL #2   Title  Pt  LE and core strength to increase to allow pt to be able to  ascend and descend 8 steps in a reciprocal manner with one handrail without difficulty.    Baseline  Able to perform well with ascent, some weakness noted with descent, poor control with reciprocal pattern    Time  6    Period  Weeks    Status  Partially Met      PT LONG TERM GOAL #3   Title  Pt Lt knee extension to be 3 or less and gait to be normalized by increasing ankle and knee flexion while ambulating    Baseline  Current: 2 degrees    Time  6    Period  Weeks    Status   Achieved      PT LONG TERM GOAL #4   Title  PT to be able to walk for 60 mintues or more without difficulty to allow pt to shop without hesitation.    Baseline  Reports able to walk at grocery store for about 1 hour    Time  6    Period  Weeks    Status  Achieved            Plan - 07/11/19 1000    Clinical Impression Statement  Therapist increased time in vector stances, added arm raise with marching and dowel wt with tandem stance to challenge balance.  PT has noted improvement in balance    Personal Factors and Comorbidities  Comorbidity 1;Fitness;Past/Current Experience    Comorbidities  see MRI    Examination-Activity Limitations  Carry;Lift;Locomotion Level;Stairs;Stand    Examination-Participation Restrictions  Cleaning;Community Activity;Laundry;Shop    Stability/Clinical Decision Making  Evolving/Moderate complexity    Rehab Potential  Good    PT Frequency  2x / week    PT Duration  4 weeks    PT Treatment/Interventions  ADLs/Self Care Home Management;Therapeutic exercise;Therapeutic activities;Balance training;Neuromuscular re-education;Patient/family education;Manual techniques;Gait training;Stair training;Functional mobility training;Aquatic Therapy    PT Next Visit Plan  begin tandem gt on foam.    PT Home Exercise Plan  scapular and cervical retraction, Lt LAQ, Lt Quad set, bridge.; 5: 12:  sit to stand, heel raises, functional squat and lunges; 5/26: tband postural exercises . wall squats    Consulted and Agree with Plan of Care  Patient       Patient will benefit from skilled therapeutic intervention in order to improve the following deficits and impairments:  Abnormal gait, Decreased activity tolerance, Decreased balance, Decreased coordination, Decreased range of motion, Decreased strength, Difficulty walking, Pain  Visit Diagnosis: Muscle weakness (generalized)  Other abnormalities of gait and mobility  History of falling     Problem List Patient Active  Problem List   Diagnosis Date Noted  . Polyneuropathy due to secondary diabetes mellitus ([place]) 05/25/2019  . Arthritis of right sacroiliac joint 05/25/2019  . Cervical disc disorder 05/25/2019  . Falls 05/25/2019  . General unsteadiness 05/25/2019  . Encounter for well woman exam with routine gynecological exam 01/09/2019  . Screening for colorectal cancer 01/09/2019  . Encounter for screening colonoscopy 12/13/2017  . Hyperparathyroidism, primary ([place]) 12/22/2016  . Vitamin D deficiency 10/31/2015  . Hyperuricemia 05/08/2013  . ONYCHOMYCOSIS, TOENAILS 02/22/2009  . NEVI, MULTIPLE 02/22/2009  . Diabetes mellitus ([place]) 01/25/2008  . BACK PAIN WITH RADICULOPATHY 11/09/2007  . Hyperlipidemia LDL goal <100 08/26/2007  . Morbid obesity ([place]) 08/26/2007  . Essential hypertension 08/26/2007  . Obstructive sleep apnea 04/20/2007    Rayetta Humphrey, PT CLT [phone] 07/11/2019, 10:03 AM  [place] [address]  [place], [name], [address] Phone: [phone]   Fax:  [phone]  Name: Pamela Stein MRN: [id number] Date of Birth: 1955/04/30

## 2019-07-12 ENCOUNTER — Ambulatory Visit (HOSPITAL_COMMUNITY): Payer: Federal, State, Local not specified - PPO | Admitting: Physical Therapy

## 2019-07-12 DIAGNOSIS — R2689 Other abnormalities of gait and mobility: Secondary | ICD-10-CM | POA: Diagnosis not present

## 2019-07-12 DIAGNOSIS — Z9181 History of falling: Secondary | ICD-10-CM | POA: Diagnosis not present

## 2019-07-12 DIAGNOSIS — M6281 Muscle weakness (generalized): Secondary | ICD-10-CM | POA: Diagnosis not present

## 2019-07-12 NOTE — Therapy (Signed)
[place], [name], [address] Phone: [phone]   Fax:  [phone]  Physical Therapy Treatment  Patient Details  Name: Pamela Stein MRN: [id number] Date of Birth: [birth year] Referring Provider (PT): Phillips Odor   Encounter Date: 07/12/2019  PT End of Session - 07/12/19 0922    Visit Number  12    Number of Visits  16    Date for PT Re-Evaluation  07/28/19    Authorization Type  BCBS    Authorization - Visit Number  12    Authorization - Number of Visits  50    Progress Note Due on Visit  16    PT Start Time  0923    PT Stop Time  1001    PT Time Calculation (min)  38 min    Activity Tolerance  Patient tolerated treatment well    Behavior During Therapy  [facility] for tasks assessed/performed       Past Medical History:  Diagnosis Date  . Anxiety   . Back pain   . Bronchitis   . Complication of anesthesia    stopped breathing after endometrial ablation procedure prior to her being diagn. with OSA  . Diabetes mellitus   . Dizziness   . Heart murmur   . History of gout   . Hyperlipidemia   . Hypertension   . Neuropathy   . Obesity   . Obstructive sleep apnea    CPAP    Past Surgical History:  Procedure Laterality Date  . ABDOMINAL HYSTERECTOMY    . ADENOIDECTOMY    . CATARACT EXTRACTION Left    right 02/2018  . COLONOSCOPY  2008   diminutive rectal polyp, s/p removal. polypoid mucosa  . COLONOSCOPY WITH PROPOFOL N/A 01/03/2018   Procedure: COLONOSCOPY WITH PROPOFOL;  Surgeon: Daneil Dolin, MD;  [facility];  Service: Endoscopy;  Laterality: N/A;  12:00pm  . DILATION AND CURETTAGE OF UTERUS    . ENDOMETRIAL ABLATION    . EYE SURGERY Left 12/04/2013   cataract  . PARATHYROIDECTOMY N/A 12/22/2016   Procedure: PARATHYROIDECTOMY, NECK EXPLORATION;  Surgeon: Jackolyn Confer, MD;  Location: [facility];  Service: General;  Laterality: N/A;  . TONSILLECTOMY      There were no vitals  filed for this visit.  Subjective Assessment - 07/12/19 0920    Subjective  Pt states that she is doing pretty good today    Pertinent History  Back pain, Lt knee pain, HTN    Limitations  Standing;Lifting;Walking;House hold activities    How long can you sit comfortably?  no problem    How long can you stand comfortably?  1-1.5 hours    How long can you walk comfortably?  1 hour at grocery store    Diagnostic tests  IMPRESSION:1. Multilevel degenerative changes of the cervical spine withposterior disc protrusions at C3-4 through C5-6 causing flatteningof the anterior surface of the spinal cord and resulting in mildspinal canal stenosis at C3-4 and C4-5. No spinal cord signalabnormality.2. No high-grade neural foraminal narrowing.3. A 4 mm T1 hyperintense, T2 hypointense structure within thepituitary gland, just posterior to the pituitary stalk, likelyrepresenting a Rathke's cleft cyst.    Patient Stated Goals  To walk better, stop falling and less knee pain, be safer going up and down steps    Currently in Pain?  No/denies    Pain Onset  More than a month ago  Balance Exercises - 07/12/19 0941      Balance Exercises: Standing   Tandem Gait  Forward;2 reps;Other (comment)   stepping over hurdles 4"  on floor then 2 reps on foam    Retro Gait  Foam/compliant surface;2 reps    Sidestepping  Foam/compliant support;2 reps    Marching  10 reps   opposite arm into flexion as with dead bug    Heel Raises  10 reps    Sit to Stand  --   15   Other Standing Exercises  vector stances 10" x 3 B     Other Standing Exercises Comments  Leg press 5 Pl x 15; Warrior 1 x 20 sec B x 3           PT Short Term Goals - 07/03/19 1024      PT SHORT TERM GOAL #1   Title  Pt to state that Lt knee pain is no greater than 2/10 to allow pt to ambulate for 25 mintues without difficulty to allow shopping without difficulty    Baseline  reports able to walk 1 hour, current pain 2/10    Time   3    Period  Weeks    Status  Achieved    Target Date  06/26/19      PT SHORT TERM GOAL #2   Title  Pt to be able to single leg stance for 25" on both LE to reduce risk of falling    Baseline  current: RT 15 sec, LT 11 sec    Time  3    Period  Weeks    Status  On-going      PT SHORT TERM GOAL #3   Title  PT LE and core strength to have increased to allow pt to be able to come sit to stand with ease and be able to complete 8 sit to stand in a 30 second period    Baseline  current 23 sec    Time  3    Period  Weeks    Status  Achieved      PT SHORT TERM GOAL #4   Title  Pt Lt knee extension to be 5 degees or less to allow a more normalized gait pattern    Baseline  current: 2 degrees    Time  3    Period  Weeks    Status  Achieved        PT Long Term Goals - 07/03/19 1025      PT LONG TERM GOAL #1   Title  PT to be able to single leg stance for 45" on both LE in order to have no falls in the past three weeks.    Baseline  current: RT 15 sec, LT 11 sec    Time  6    Period  Weeks    Status  On-going      PT LONG TERM GOAL #2   Title  Pt  LE and core strength to increase to allow pt to be able to ascend and descend 8 steps in a reciprocal manner with one handrail without difficulty.    Baseline  Able to perform well with ascent, some weakness noted with descent, poor control with reciprocal pattern    Time  6    Period  Weeks    Status  Partially Met      PT LONG TERM GOAL #3   Title  Pt Lt knee extension to be  3 or less and gait to be normalized by increasing ankle and knee flexion while ambulating    Baseline  Current: 2 degrees    Time  6    Period  Weeks    Status  Achieved      PT LONG TERM GOAL #4   Title  PT to be able to walk for 60 mintues or more without difficulty to allow pt to shop without hesitation.    Baseline  Reports able to walk at grocery store for about 1 hour    Time  6    Period  Weeks    Status  Achieved            Plan - 07/12/19  0957    Clinical Impression Statement  Advanced balance activity by adding warrior, stepping over hurdles as well as foam gt.  Pt challenged but able to maintain balance with all activities with verbal and occasional tactile cuing.    Personal Factors and Comorbidities  Comorbidity 1;Fitness;Past/Current Experience    Comorbidities  see MRI    Examination-Activity Limitations  Carry;Lift;Locomotion Level;Stairs;Stand    Examination-Participation Restrictions  Cleaning;Community Activity;Laundry;Shop    Stability/Clinical Decision Making  Evolving/Moderate complexity    Rehab Potential  Good    PT Frequency  2x / week    PT Duration  4 weeks    PT Treatment/Interventions  ADLs/Self Care Home Management;Therapeutic exercise;Therapeutic activities;Balance training;Neuromuscular re-education;Patient/family education;Manual techniques;Gait training;Stair training;Functional mobility training;Aquatic Therapy    PT Next Visit Plan  continue warrior poses adding II and foam activities       Patient will benefit from skilled therapeutic intervention in order to improve the following deficits and impairments:  Abnormal gait, Decreased activity tolerance, Decreased balance, Decreased coordination, Decreased range of motion, Decreased strength, Difficulty walking, Pain  Visit Diagnosis: Muscle weakness (generalized)  Other abnormalities of gait and mobility     Problem List Patient Active Problem List   Diagnosis Date Noted  . Polyneuropathy due to secondary diabetes mellitus ([place]) 05/25/2019  . Arthritis of right sacroiliac joint 05/25/2019  . Cervical disc disorder 05/25/2019  . Falls 05/25/2019  . General unsteadiness 05/25/2019  . Encounter for well woman exam with routine gynecological exam 01/09/2019  . Screening for colorectal cancer 01/09/2019  . Encounter for screening colonoscopy 12/13/2017  . Hyperparathyroidism, primary ([place]) 12/22/2016  . Vitamin D deficiency 10/31/2015  .  Hyperuricemia 05/08/2013  . ONYCHOMYCOSIS, TOENAILS 02/22/2009  . NEVI, MULTIPLE 02/22/2009  . Diabetes mellitus ([place]) 01/25/2008  . BACK PAIN WITH RADICULOPATHY 11/09/2007  . Hyperlipidemia LDL goal <100 08/26/2007  . Morbid obesity ([place]) 08/26/2007  . Essential hypertension 08/26/2007  . Obstructive sleep apnea 04/20/2007    Rayetta Humphrey, PT CLT [phone] 07/12/2019, 10:01 AM  [place] [address] [place], [name], [address] Phone: [phone]   Fax:  [phone]  Name: Pamela Stein MRN: [id number] Date of Birth: Mar 21, 1955

## 2019-07-14 DIAGNOSIS — E559 Vitamin D deficiency, unspecified: Secondary | ICD-10-CM | POA: Diagnosis not present

## 2019-07-14 DIAGNOSIS — G629 Polyneuropathy, unspecified: Secondary | ICD-10-CM | POA: Diagnosis not present

## 2019-07-14 DIAGNOSIS — R5383 Other fatigue: Secondary | ICD-10-CM | POA: Diagnosis not present

## 2019-07-18 ENCOUNTER — Ambulatory Visit (INDEPENDENT_AMBULATORY_CARE_PROVIDER_SITE_OTHER): Payer: Federal, State, Local not specified - PPO | Admitting: Orthopaedic Surgery

## 2019-07-18 ENCOUNTER — Encounter: Payer: Self-pay | Admitting: Orthopaedic Surgery

## 2019-07-18 ENCOUNTER — Ambulatory Visit (HOSPITAL_COMMUNITY): Payer: Federal, State, Local not specified - PPO | Admitting: Physical Therapy

## 2019-07-18 ENCOUNTER — Other Ambulatory Visit: Payer: Self-pay

## 2019-07-18 VITALS — BP 133/70 | HR 77 | Ht 62.5 in | Wt 225.0 lb

## 2019-07-18 DIAGNOSIS — M25362 Other instability, left knee: Secondary | ICD-10-CM

## 2019-07-18 DIAGNOSIS — R2689 Other abnormalities of gait and mobility: Secondary | ICD-10-CM

## 2019-07-18 DIAGNOSIS — Z9181 History of falling: Secondary | ICD-10-CM

## 2019-07-18 DIAGNOSIS — M6281 Muscle weakness (generalized): Secondary | ICD-10-CM

## 2019-07-18 DIAGNOSIS — M25562 Pain in left knee: Secondary | ICD-10-CM

## 2019-07-18 DIAGNOSIS — G8929 Other chronic pain: Secondary | ICD-10-CM | POA: Diagnosis not present

## 2019-07-18 NOTE — Progress Notes (Signed)
Patient AS:[id number] Pamela Stein, female DOB:Jun 30, 1955, 64 y.o. ALP:[id number]  Chief Complaint  Patient presents with  . Follow-up    bilateral knee pain s/p therapy    HPI  Pamela Stein is a 64 y.o. female who has bilateral knee pain, more on the left.  She has been to PT.  She is a little better at times but her left knee is giving way more and has more pain.  She has a few good days but most are painful and bad.  She has swelling as well as popping.  I am concerned about tear of the medial meniscus and will order a MRI.   Body mass index is 40.5 kg/m.  ROS  Review of Systems  Constitutional: Positive for activity change.  Musculoskeletal: Positive for arthralgias, back pain, gait problem and joint swelling.  Psychiatric/Behavioral: The patient is nervous/anxious.   All other systems reviewed and are negative.   All other systems reviewed and are negative.  The following is a summary of the past history medically, past history surgically, known current medicines, social history and family history.  This information is gathered electronically by the computer from prior information and documentation.  I review this each visit and have found including this information at this point in the chart is beneficial and informative.    Past Medical History:  Diagnosis Date  . Anxiety   . Back pain   . Bronchitis   . Complication of anesthesia    stopped breathing after endometrial ablation procedure prior to her being diagn. with OSA  . Diabetes mellitus   . Dizziness   . Heart murmur   . History of gout   . Hyperlipidemia   . Hypertension   . Neuropathy   . Obesity   . Obstructive sleep apnea    CPAP    Past Surgical History:  Procedure Laterality Date  . ABDOMINAL HYSTERECTOMY    . ADENOIDECTOMY    . CATARACT EXTRACTION Left    right 02/2018  . COLONOSCOPY  2008   diminutive rectal polyp, s/p removal. polypoid mucosa  . COLONOSCOPY WITH PROPOFOL N/A 01/03/2018    Procedure: COLONOSCOPY WITH PROPOFOL;  Surgeon: Daneil Dolin, MD;  [facility];  Service: Endoscopy;  Laterality: N/A;  12:00pm  . DILATION AND CURETTAGE OF UTERUS    . ENDOMETRIAL ABLATION    . EYE SURGERY Left 12/04/2013   cataract  . PARATHYROIDECTOMY N/A 12/22/2016   Procedure: PARATHYROIDECTOMY, NECK EXPLORATION;  Surgeon: Jackolyn Confer, MD;  [facility];  Service: General;  Laterality: N/A;  . TONSILLECTOMY      Family History  Problem Relation Age of Onset  . Cancer Father        throat  . Throat cancer Father   . Hypertension Father   . Diabetes Father   . Diabetes Maternal Grandmother   . Hypertension Maternal Grandfather   . Hypertension Paternal Grandmother   . Diabetes Paternal Grandfather   . Hypertension Mother   . Diabetes Mother   . Cancer Paternal Aunt   . Colon cancer Neg Hx   . Colon polyps Neg Hx     Social History Social History   Tobacco Use  . Smoking status: Never Smoker  . Smokeless tobacco: Never Used  Substance Use Topics  . Alcohol use: No    Alcohol/week: 0.0 standard drinks  . Drug use: No    Allergies  Allergen Reactions  . Ace Inhibitors Cough    Current Outpatient  Medications  Medication Sig Dispense Refill  . albuterol (PROVENTIL HFA;VENTOLIN HFA) 108 (90 Base) MCG/ACT inhaler Inhale 1-2 puffs into the lungs every 6 (six) hours as needed for wheezing or shortness of breath. 1 Inhaler 0  . amLODipine (NORVASC) 10 MG tablet TAKE 1 TABLET BY MOUTH DAILY (Patient taking differently: Take 10 mg by mouth daily. ) 30 tablet 4  . aspirin EC 81 MG tablet Take 81 mg by mouth daily.    . DULoxetine (CYMBALTA) 60 MG capsule Take 1 capsule every day by oral route.    . furosemide (LASIX) 20 MG tablet Take 20 mg by mouth daily.   3  . gabapentin (NEURONTIN) 300 MG capsule Take 1 capsule (300 mg total) by mouth 3 (three) times daily. (Patient taking differently: Take 300 mg by mouth daily. ) 90 capsule 3  . losartan  (COZAAR) 25 MG tablet Take 25 mg by mouth daily.   3  . metFORMIN (GLUCOPHAGE) 500 MG tablet TAKE 1 TABLET BY MOUTH TWICE DAILY WITH A MEAL (Patient taking differently: Take 500 mg by mouth daily with lunch. ) 180 tablet 1  . pravastatin (PRAVACHOL) 80 MG tablet TAKE 1 TABLET(80 MG) BY MOUTH EVERY EVENING (Patient taking differently: Take 80 mg by mouth every evening. ) 90 tablet 0  . spironolactone (ALDACTONE) 25 MG tablet Take 25 mg by mouth daily.   3   No current facility-administered medications for this visit.     Physical Exam  Blood pressure 133/70, pulse 77, height 5' 2.5" (1.588 m), weight 225 lb (102.1 kg).  Constitutional: overall normal hygiene, normal nutrition, well developed, normal grooming, normal body habitus. Assistive device:none  Musculoskeletal: gait and station Limp left, muscle tone and strength are normal, no tremors or atrophy is present.  .  Neurological: coordination overall normal.  Deep tendon reflex/nerve stretch intact.  Sensation normal.  Cranial nerves II-XII intact.   Skin:   Normal overall no scars, lesions, ulcers or rashes. No psoriasis.  Psychiatric: Alert and oriented x 3.  Recent memory intact, remote memory unclear.  Normal mood and affect. Well groomed.  Good eye contact.  Cardiovascular: overall no swelling, no varicosities, no edema bilaterally, normal temperatures of the legs and arms, no clubbing, cyanosis and good capillary refill.  Lymphatic: palpation is normal.  Left knee has effusion, medial pain, crepitus, ROM 0 to 105, positive medial Mc[place]ray, limp left, NV intact.    All other systems reviewed and are negative   The patient has been educated about the nature of the problem(s) and counseled on treatment options.  The patient appeared to understand what I have discussed and is in agreement with it.  Encounter Diagnoses  Name Primary?  . Chronic pain of left knee Yes  . Other instability, left knee   I have read the PT  notes.  PLAN Call if any problems.  Precautions discussed.  Continue current medications.   Return to clinic 2 weeks   Get MRI of the left knee.  Electronically Signed Sanjuana Kava, MD 6/8/20219:43 AM

## 2019-07-18 NOTE — Therapy (Signed)
[place], [name], [address] Phone: [phone]   Fax:  [phone]  Physical Therapy Treatment  Patient Details  Name: Pamela Stein MRN: [id number] Date of Birth: 1955-10-10 Referring Provider (PT): Phillips Odor   Encounter Date: 07/18/2019  PT End of Session - 07/18/19 1049    Visit Number  13    Number of Visits  16    Date for PT Re-Evaluation  07/28/19    Authorization Type  BCBS    Authorization - Visit Number  13    Authorization - Number of Visits  50    Progress Note Due on Visit  16    PT Start Time  1005    PT Stop Time  1050    PT Time Calculation (min)  45 min    Activity Tolerance  Patient tolerated treatment well    Behavior During Therapy  [facility] for tasks assessed/performed       Past Medical History:  Diagnosis Date  . Anxiety   . Back pain   . Bronchitis   . Complication of anesthesia    stopped breathing after endometrial ablation procedure prior to her being diagn. with OSA  . Diabetes mellitus   . Dizziness   . Heart murmur   . History of gout   . Hyperlipidemia   . Hypertension   . Neuropathy   . Obesity   . Obstructive sleep apnea    CPAP    Past Surgical History:  Procedure Laterality Date  . ABDOMINAL HYSTERECTOMY    . ADENOIDECTOMY    . CATARACT EXTRACTION Left    right 02/2018  . COLONOSCOPY  2008   diminutive rectal polyp, s/p removal. polypoid mucosa  . COLONOSCOPY WITH PROPOFOL N/A 01/03/2018   Procedure: COLONOSCOPY WITH PROPOFOL;  Surgeon: Daneil Dolin, MD;  [facility];  Service: Endoscopy;  Laterality: N/A;  12:00pm  . DILATION AND CURETTAGE OF UTERUS    . ENDOMETRIAL ABLATION    . EYE SURGERY Left 12/04/2013   cataract  . PARATHYROIDECTOMY N/A 12/22/2016   Procedure: PARATHYROIDECTOMY, NECK EXPLORATION;  Surgeon: Jackolyn Confer, MD;  [facility];  Service: General;  Laterality: N/A;  . TONSILLECTOMY      There were no vitals  filed for this visit.  Subjective Assessment - 07/18/19 1013    Subjective  Pt states her Lt knee is hurting today at 5/10.  Went to [facility] this morning and he has scheduled her an MRI.    Currently in Pain?  Yes    Pain Score  5     Pain Location  Knee    Pain Orientation  Right    Pain Descriptors / Indicators  Aching                        [facility] Adult PT Treatment/Exercise - 07/18/19 0001      Lumbar Exercises: Standing   Other Standing Lumbar Exercises  --          Balance Exercises - 07/18/19 0001      Balance Exercises: Standing   Tandem Gait  Forward;2 reps;Other (comment)    Retro Gait  Foam/compliant surface;2 reps    Sidestepping  Foam/compliant support;2 reps    Marching  15 reps   with opposite UE motions   Heel Raises  15 reps    Other Standing Exercises  vector stances 10" x 3 B  Other Standing Exercises Comments  Leg press 5 Pl x 15; Warrior 1 x 20 sec B x 3           PT Short Term Goals - 07/03/19 1024      PT SHORT TERM GOAL #1   Title  Pt to state that Lt knee pain is no greater than 2/10 to allow pt to ambulate for 25 mintues without difficulty to allow shopping without difficulty    Baseline  reports able to walk 1 hour, current pain 2/10    Time  3    Period  Weeks    Status  Achieved    Target Date  06/26/19      PT SHORT TERM GOAL #2   Title  Pt to be able to single leg stance for 25" on both LE to reduce risk of falling    Baseline  current: RT 15 sec, LT 11 sec    Time  3    Period  Weeks    Status  On-going      PT SHORT TERM GOAL #3   Title  PT LE and core strength to have increased to allow pt to be able to come sit to stand with ease and be able to complete 8 sit to stand in a 30 second period    Baseline  current 23 sec    Time  3    Period  Weeks    Status  Achieved      PT SHORT TERM GOAL #4   Title  Pt Lt knee extension to be 5 degees or less to allow a more normalized gait pattern    Baseline   current: 2 degrees    Time  3    Period  Weeks    Status  Achieved        PT Long Term Goals - 07/03/19 1025      PT LONG TERM GOAL #1   Title  PT to be able to single leg stance for 45" on both LE in order to have no falls in the past three weeks.    Baseline  current: RT 15 sec, LT 11 sec    Time  6    Period  Weeks    Status  On-going      PT LONG TERM GOAL #2   Title  Pt  LE and core strength to increase to allow pt to be able to ascend and descend 8 steps in a reciprocal manner with one handrail without difficulty.    Baseline  Able to perform well with ascent, some weakness noted with descent, poor control with reciprocal pattern    Time  6    Period  Weeks    Status  Partially Met      PT LONG TERM GOAL #3   Title  Pt Lt knee extension to be 3 or less and gait to be normalized by increasing ankle and knee flexion while ambulating    Baseline  Current: 2 degrees    Time  6    Period  Weeks    Status  Achieved      PT LONG TERM GOAL #4   Title  PT to be able to walk for 60 mintues or more without difficulty to allow pt to shop without hesitation.    Baseline  Reports able to walk at grocery store for about 1 hour    Time  6    Period  Weeks  Status  Achieved            Plan - 07/18/19 1053    Clinical Impression Statement  Continued with established POC with focus on improving stability.  Pt takes increased time to complete dynamic activities on foam.  Pt able to self correct but mostly with posterior LOB on balance beam.  did not have time to progress the warrior stance this session.    Personal Factors and Comorbidities  Comorbidity 1;Fitness;Past/Current Experience    Comorbidities  see MRI    Examination-Activity Limitations  Carry;Lift;Locomotion Level;Stairs;Stand    Examination-Participation Restrictions  Cleaning;Community Activity;Laundry;Shop    Stability/Clinical Decision Making  Evolving/Moderate complexity    Rehab Potential  Good    PT  Frequency  2x / week    PT Duration  4 weeks    PT Treatment/Interventions  ADLs/Self Care Home Management;Therapeutic exercise;Therapeutic activities;Balance training;Neuromuscular re-education;Patient/family education;Manual techniques;Gait training;Stair training;Functional mobility training;Aquatic Therapy    PT Next Visit Plan  continue with stability.  Add warrior II pose next session.       Patient will benefit from skilled therapeutic intervention in order to improve the following deficits and impairments:  Abnormal gait, Decreased activity tolerance, Decreased balance, Decreased coordination, Decreased range of motion, Decreased strength, Difficulty walking, Pain  Visit Diagnosis: Muscle weakness (generalized)  Other abnormalities of gait and mobility  History of falling     Problem List Patient Active Problem List   Diagnosis Date Noted  . Polyneuropathy due to secondary diabetes mellitus ([place]) 05/25/2019  . Arthritis of right sacroiliac joint 05/25/2019  . Cervical disc disorder 05/25/2019  . Falls 05/25/2019  . General unsteadiness 05/25/2019  . Encounter for well woman exam with routine gynecological exam 01/09/2019  . Screening for colorectal cancer 01/09/2019  . Encounter for screening colonoscopy 12/13/2017  . Hyperparathyroidism, primary ([place]) 12/22/2016  . Vitamin D deficiency 10/31/2015  . Hyperuricemia 05/08/2013  . ONYCHOMYCOSIS, TOENAILS 02/22/2009  . NEVI, MULTIPLE 02/22/2009  . Diabetes mellitus ([place]) 01/25/2008  . BACK PAIN WITH RADICULOPATHY 11/09/2007  . Hyperlipidemia LDL goal <100 08/26/2007  . Morbid obesity ([place]) 08/26/2007  . Essential hypertension 08/26/2007  . Obstructive sleep apnea 04/20/2007   Teena Irani, PTA/CLT [phone]  Teena Irani 07/18/2019, 10:55 AM  [place], [name], [address] Phone: [phone]   Fax:  [phone]  Name: Pamela Stein MRN: [id number] Date of Birth: [birth year]

## 2019-07-20 ENCOUNTER — Ambulatory Visit (HOSPITAL_COMMUNITY): Payer: Federal, State, Local not specified - PPO | Admitting: Physical Therapy

## 2019-07-20 ENCOUNTER — Other Ambulatory Visit: Payer: Self-pay

## 2019-07-20 ENCOUNTER — Encounter (HOSPITAL_COMMUNITY): Payer: Self-pay | Admitting: Physical Therapy

## 2019-07-20 DIAGNOSIS — R2689 Other abnormalities of gait and mobility: Secondary | ICD-10-CM

## 2019-07-20 DIAGNOSIS — M6281 Muscle weakness (generalized): Secondary | ICD-10-CM | POA: Diagnosis not present

## 2019-07-20 DIAGNOSIS — Z9181 History of falling: Secondary | ICD-10-CM | POA: Diagnosis not present

## 2019-07-20 NOTE — Therapy (Signed)
[place], [name], [address] Phone: [phone]   Fax:  [phone]  Physical Therapy Treatment  Patient Details  Name: Pamela Stein MRN: [id number] Date of Birth: 06-Apr-1955 Referring Provider (PT): Phillips Odor   Encounter Date: 07/20/2019   PT End of Session - 07/20/19 0910    Visit Number 14    Number of Visits 16    Date for PT Re-Evaluation 07/28/19    Authorization Type BCBS    Authorization - Visit Number 14    Authorization - Number of Visits 50    Progress Note Due on Visit 16    PT Start Time 0905    PT Stop Time 0943    PT Time Calculation (min) 38 min    Activity Tolerance Patient tolerated treatment well    Behavior During Therapy [facility] for tasks assessed/performed           Past Medical History:  Diagnosis Date  . Anxiety   . Back pain   . Bronchitis   . Complication of anesthesia    stopped breathing after endometrial ablation procedure prior to her being diagn. with OSA  . Diabetes mellitus   . Dizziness   . Heart murmur   . History of gout   . Hyperlipidemia   . Hypertension   . Neuropathy   . Obesity   . Obstructive sleep apnea    CPAP    Past Surgical History:  Procedure Laterality Date  . ABDOMINAL HYSTERECTOMY    . ADENOIDECTOMY    . CATARACT EXTRACTION Left    right 02/2018  . COLONOSCOPY  2008   diminutive rectal polyp, s/p removal. polypoid mucosa  . COLONOSCOPY WITH PROPOFOL N/A 01/03/2018   Procedure: COLONOSCOPY WITH PROPOFOL;  Surgeon: Daneil Dolin, MD;  [facility];  Service: Endoscopy;  Laterality: N/A;  12:00pm  . DILATION AND CURETTAGE OF UTERUS    . ENDOMETRIAL ABLATION    . EYE SURGERY Left 12/04/2013   cataract  . PARATHYROIDECTOMY N/A 12/22/2016   Procedure: PARATHYROIDECTOMY, NECK EXPLORATION;  Surgeon: Jackolyn Confer, MD;  Location: [facility];  Service: General;  Laterality: N/A;  . TONSILLECTOMY      There were no vitals  filed for this visit.   Subjective Assessment - 07/20/19 0907    Subjective Patient says she thinks her balance is better. Says her knee has been "acting crazy". Says she has not had her MRI yet. No falls since last visit.    Currently in Pain? Yes    Pain Score 4     Pain Location Knee    Pain Orientation Left;Anterior    Pain Descriptors / Indicators Aching    Pain Type Chronic pain    Pain Onset More than a month ago    Pain Frequency Intermittent                             [facility] Adult PT Treatment/Exercise - 07/20/19 0001      Lumbar Exercises: Seated   Sit to Stand 10 reps      Knee/Hip Exercises: Standing   Heel Raises Both;15 reps    Hip Abduction Both;1 set;15 reps    Hip Extension Both;1 set;15 reps    Forward Step Up Both;1 set;10 reps;Step Height: 4";Hand Hold: 1               Balance Exercises - 07/20/19 0001  Balance Exercises: Standing   Tandem Gait Forward;2 reps;Foam/compliant surface    Sidestepping Foam/compliant support;2 reps    Other Standing Exercises vector stances 10" x 3 BLE 2 rounds with HHA x1                PT Short Term Goals - 07/03/19 1024      PT SHORT TERM GOAL #1   Title Pt to state that Lt knee pain is no greater than 2/10 to allow pt to ambulate for 25 mintues without difficulty to allow shopping without difficulty    Baseline reports able to walk 1 hour, current pain 2/10    Time 3    Period Weeks    Status Achieved    Target Date 06/26/19      PT SHORT TERM GOAL #2   Title Pt to be able to single leg stance for 25" on both LE to reduce risk of falling    Baseline current: RT 15 sec, LT 11 sec    Time 3    Period Weeks    Status On-going      PT SHORT TERM GOAL #3   Title PT LE and core strength to have increased to allow pt to be able to come sit to stand with ease and be able to complete 8 sit to stand in a 30 second period    Baseline current 23 sec    Time 3    Period Weeks    Status  Achieved      PT SHORT TERM GOAL #4   Title Pt Lt knee extension to be 5 degees or less to allow a more normalized gait pattern    Baseline current: 2 degrees    Time 3    Period Weeks    Status Achieved             PT Long Term Goals - 07/03/19 1025      PT LONG TERM GOAL #1   Title PT to be able to single leg stance for 45" on both LE in order to have no falls in the past three weeks.    Baseline current: RT 15 sec, LT 11 sec    Time 6    Period Weeks    Status On-going      PT LONG TERM GOAL #2   Title Pt  LE and core strength to increase to allow pt to be able to ascend and descend 8 steps in a reciprocal manner with one handrail without difficulty.    Baseline Able to perform well with ascent, some weakness noted with descent, poor control with reciprocal pattern    Time 6    Period Weeks    Status Partially Met      PT LONG TERM GOAL #3   Title Pt Lt knee extension to be 3 or less and gait to be normalized by increasing ankle and knee flexion while ambulating    Baseline Current: 2 degrees    Time 6    Period Weeks    Status Achieved      PT LONG TERM GOAL #4   Title PT to be able to walk for 60 mintues or more without difficulty to allow pt to shop without hesitation.    Baseline Reports able to walk at grocery store for about 1 hour    Time 6    Period Weeks    Status Achieved  Plan - 07/20/19 0944    Clinical Impression Statement Patient tolerated session well overall today. Patient continues to be well challenged with balance activity, specifically dynamic balance. Patient requires HHA x 1 to perform SLS vectors, and intermittent HHA at wall to perform sidestepping on foam. Patient shows improved balance with forward tandem gait on foam beam. Patient noted increased discomfort in LT knee with 4-inch box step ups today, and requires HHA x 1 for balance and stability. Patient will continue to benefit from skilled therapy services to  progress LE strength and balance to improve functional mobility and reduce risk for future falls.    Personal Factors and Comorbidities Comorbidity 1;Fitness;Past/Current Experience    Comorbidities see MRI    Examination-Activity Limitations Carry;Lift;Locomotion Level;Stairs;Stand    Examination-Participation Restrictions Cleaning;Community Activity;Laundry;Shop    Stability/Clinical Decision Making Evolving/Moderate complexity    Rehab Potential Good    PT Frequency 2x / week    PT Duration 4 weeks    PT Treatment/Interventions ADLs/Self Care Home Management;Therapeutic exercise;Therapeutic activities;Balance training;Neuromuscular re-education;Patient/family education;Manual techniques;Gait training;Stair training;Functional mobility training;Aquatic Therapy    PT Next Visit Plan continue with stability.  Add warrior II pose next session.           Patient will benefit from skilled therapeutic intervention in order to improve the following deficits and impairments:  Abnormal gait, Decreased activity tolerance, Decreased balance, Decreased coordination, Decreased range of motion, Decreased strength, Difficulty walking, Pain  Visit Diagnosis: Muscle weakness (generalized)  Other abnormalities of gait and mobility  History of falling     Problem List Patient Active Problem List   Diagnosis Date Noted  . Polyneuropathy due to secondary diabetes mellitus ([place]) 05/25/2019  . Arthritis of right sacroiliac joint 05/25/2019  . Cervical disc disorder 05/25/2019  . Falls 05/25/2019  . General unsteadiness 05/25/2019  . Encounter for well woman exam with routine gynecological exam 01/09/2019  . Screening for colorectal cancer 01/09/2019  . Encounter for screening colonoscopy 12/13/2017  . Hyperparathyroidism, primary ([place]) 12/22/2016  . Vitamin D deficiency 10/31/2015  . Hyperuricemia 05/08/2013  . ONYCHOMYCOSIS, TOENAILS 02/22/2009  . NEVI, MULTIPLE 02/22/2009  . Diabetes  mellitus ([place]) 01/25/2008  . BACK PAIN WITH RADICULOPATHY 11/09/2007  . Hyperlipidemia LDL goal <100 08/26/2007  . Morbid obesity ([place]) 08/26/2007  . Essential hypertension 08/26/2007  . Obstructive sleep apnea 04/20/2007    9:45 AM, 07/20/19 Josue Hector PT DPT  Physical Therapist with [facility]  [phone] [place] [address] [place], [name], [address] Phone: [phone]   Fax:  [phone]  Name: TAHJ LINDSETH MRN: [id number] Date of Birth: 1956/01/15

## 2019-07-24 ENCOUNTER — Ambulatory Visit (HOSPITAL_COMMUNITY): Payer: Federal, State, Local not specified - PPO | Admitting: Physical Therapy

## 2019-07-24 ENCOUNTER — Other Ambulatory Visit: Payer: Self-pay

## 2019-07-24 ENCOUNTER — Encounter (HOSPITAL_COMMUNITY): Payer: Self-pay | Admitting: Physical Therapy

## 2019-07-24 DIAGNOSIS — Z9181 History of falling: Secondary | ICD-10-CM

## 2019-07-24 DIAGNOSIS — M6281 Muscle weakness (generalized): Secondary | ICD-10-CM | POA: Diagnosis not present

## 2019-07-24 DIAGNOSIS — R2689 Other abnormalities of gait and mobility: Secondary | ICD-10-CM | POA: Diagnosis not present

## 2019-07-24 NOTE — Therapy (Signed)
[place], [name], [address] Phone: [phone]   Fax:  [phone]  Physical Therapy Treatment  Patient Details  Name: Pamela Stein MRN: [id number] Date of Birth: 1955/10/28 Referring Provider (PT): Phillips Odor   Encounter Date: 07/24/2019   PT End of Session - 07/24/19 0921    Visit Number 15    Number of Visits 16    Date for PT Re-Evaluation 07/28/19    Authorization Type BCBS    Authorization - Visit Number 15    Authorization - Number of Visits 50    Progress Note Due on Visit 16    PT Start Time 0916   arrived late   PT Stop Time 0950    PT Time Calculation (min) 34 min    Activity Tolerance Patient tolerated treatment well    Behavior During Therapy [facility] for tasks assessed/performed           Past Medical History:  Diagnosis Date  . Anxiety   . Back pain   . Bronchitis   . Complication of anesthesia    stopped breathing after endometrial ablation procedure prior to her being diagn. with OSA  . Diabetes mellitus   . Dizziness   . Heart murmur   . History of gout   . Hyperlipidemia   . Hypertension   . Neuropathy   . Obesity   . Obstructive sleep apnea    CPAP    Past Surgical History:  Procedure Laterality Date  . ABDOMINAL HYSTERECTOMY    . ADENOIDECTOMY    . CATARACT EXTRACTION Left    right 02/2018  . COLONOSCOPY  2008   diminutive rectal polyp, s/p removal. polypoid mucosa  . COLONOSCOPY WITH PROPOFOL N/A 01/03/2018   Procedure: COLONOSCOPY WITH PROPOFOL;  Surgeon: Daneil Dolin, MD;  [facility];  Service: Endoscopy;  Laterality: N/A;  12:00pm  . DILATION AND CURETTAGE OF UTERUS    . ENDOMETRIAL ABLATION    . EYE SURGERY Left 12/04/2013   cataract  . PARATHYROIDECTOMY N/A 12/22/2016   Procedure: PARATHYROIDECTOMY, NECK EXPLORATION;  Surgeon: Jackolyn Confer, MD;  [facility];  Service: General;  Laterality: N/A;  . TONSILLECTOMY      There were  no vitals filed for this visit.   Subjective Assessment - 07/24/19 0920    Subjective Patient says she is doing "alright. Says her balance is "about the same". says her knee has improved some.    Currently in Pain? No/denies    Pain Onset More than a month ago                             [facility] Adult PT Treatment/Exercise - 07/24/19 0001      Knee/Hip Exercises: Standing   Heel Raises --    Knee Flexion Both;2 sets;10 reps    Knee Flexion Limitations 1#    Hip Abduction Both;2 sets;10 reps    Abduction Limitations 1#    Hip Extension Both;2 sets;10 reps    Extension Limitations 1#    Forward Step Up Both;1 set;10 reps;Hand Hold: 1;Step Height: 6"    SLS 2 x 30" holds with 1 finger assist       Knee/Hip Exercises: Seated   Sit to Sand 2 sets;10 reps;without UE support               Balance Exercises - 07/24/19 0001  Balance Exercises: Standing   Tandem Gait Forward;Foam/compliant surface;1 rep    Sidestepping Foam/compliant support;1 rep               PT Short Term Goals - 07/03/19 1024      PT SHORT TERM GOAL #1   Title Pt to state that Lt knee pain is no greater than 2/10 to allow pt to ambulate for 25 mintues without difficulty to allow shopping without difficulty    Baseline reports able to walk 1 hour, current pain 2/10    Time 3    Period Weeks    Status Achieved    Target Date 06/26/19      PT SHORT TERM GOAL #2   Title Pt to be able to single leg stance for 25" on both LE to reduce risk of falling    Baseline current: RT 15 sec, LT 11 sec    Time 3    Period Weeks    Status On-going      PT SHORT TERM GOAL #3   Title PT LE and core strength to have increased to allow pt to be able to come sit to stand with ease and be able to complete 8 sit to stand in a 30 second period    Baseline current 23 sec    Time 3    Period Weeks    Status Achieved      PT SHORT TERM GOAL #4   Title Pt Lt knee extension to be 5 degees or less  to allow a more normalized gait pattern    Baseline current: 2 degrees    Time 3    Period Weeks    Status Achieved             PT Long Term Goals - 07/03/19 1025      PT LONG TERM GOAL #1   Title PT to be able to single leg stance for 45" on both LE in order to have no falls in the past three weeks.    Baseline current: RT 15 sec, LT 11 sec    Time 6    Period Weeks    Status On-going      PT LONG TERM GOAL #2   Title Pt  LE and core strength to increase to allow pt to be able to ascend and descend 8 steps in a reciprocal manner with one handrail without difficulty.    Baseline Able to perform well with ascent, some weakness noted with descent, poor control with reciprocal pattern    Time 6    Period Weeks    Status Partially Met      PT LONG TERM GOAL #3   Title Pt Lt knee extension to be 3 or less and gait to be normalized by increasing ankle and knee flexion while ambulating    Baseline Current: 2 degrees    Time 6    Period Weeks    Status Achieved      PT LONG TERM GOAL #4   Title PT to be able to walk for 60 mintues or more without difficulty to allow pt to shop without hesitation.    Baseline Reports able to walk at grocery store for about 1 hour    Time 6    Period Weeks    Status Achieved                 Plan - 07/24/19 0953    Clinical Impression Statement Patient tolerated  session well overall today. Patient was able to increase reps with standing hip strengthening and tolerated added 1# weight well. Patient cued on proper form and leg position with standing hip abduction and extension. Patient continues to be well challenged with balance activity. Patient showing improvement in static balance, but requires finger touch assist for single leg stand. Patient also challenged with tandem and sidestepping on foam, requiring HHA x 1, and intermittent Min guard for LOB toward posterior. Patient was able to improve with verbal cues for anterior weight shift to  avoid posterior lean. Patient will continue to benefit form skilled therapy services to progress LE strength and balance to improve functional mobility and reduce risk for falls.    Personal Factors and Comorbidities Comorbidity 1;Fitness;Past/Current Experience    Comorbidities see MRI    Examination-Activity Limitations Carry;Lift;Locomotion Level;Stairs;Stand    Examination-Participation Restrictions Cleaning;Community Activity;Laundry;Shop    Stability/Clinical Decision Making Evolving/Moderate complexity    Rehab Potential Good    PT Frequency 2x / week    PT Duration 4 weeks    PT Treatment/Interventions ADLs/Self Care Home Management;Therapeutic exercise;Therapeutic activities;Balance training;Neuromuscular re-education;Patient/family education;Manual techniques;Gait training;Stair training;Functional mobility training;Aquatic Therapy    PT Next Visit Plan Reassess next visit    PT Home Exercise Plan scapular and cervical retraction, Lt LAQ, Lt Quad set, bridge.; 5: 12:  sit to stand, heel raises, functional squat and lunges; 5/26: tband postural exercises . wall squats           Patient will benefit from skilled therapeutic intervention in order to improve the following deficits and impairments:  Abnormal gait, Decreased activity tolerance, Decreased balance, Decreased coordination, Decreased range of motion, Decreased strength, Difficulty walking, Pain  Visit Diagnosis: Muscle weakness (generalized)  Other abnormalities of gait and mobility  History of falling     Problem List Patient Active Problem List   Diagnosis Date Noted  . Polyneuropathy due to secondary diabetes mellitus ([place]) 05/25/2019  . Arthritis of right sacroiliac joint 05/25/2019  . Cervical disc disorder 05/25/2019  . Falls 05/25/2019  . General unsteadiness 05/25/2019  . Encounter for well woman exam with routine gynecological exam 01/09/2019  . Screening for colorectal cancer 01/09/2019  . Encounter  for screening colonoscopy 12/13/2017  . Hyperparathyroidism, primary ([place]) 12/22/2016  . Vitamin D deficiency 10/31/2015  . Hyperuricemia 05/08/2013  . ONYCHOMYCOSIS, TOENAILS 02/22/2009  . NEVI, MULTIPLE 02/22/2009  . Diabetes mellitus ([employer]) 01/25/2008  . BACK PAIN WITH RADICULOPATHY 11/09/2007  . Hyperlipidemia LDL goal <100 08/26/2007  . Morbid obesity ([place]) 08/26/2007  . Essential hypertension 08/26/2007  . Obstructive sleep apnea 04/20/2007    9:54 AM, 07/24/19 Josue Hector PT DPT  Physical Therapist with [facility]  [phone] [place] [address] [place], [name], [address] Phone: [phone]   Fax:  [phone]  Name: TANGIA PINARD MRN: [id number] Date of Birth: [birth year]

## 2019-07-26 ENCOUNTER — Ambulatory Visit (HOSPITAL_COMMUNITY): Payer: Federal, State, Local not specified - PPO | Admitting: Physical Therapy

## 2019-07-26 ENCOUNTER — Other Ambulatory Visit: Payer: Self-pay

## 2019-07-26 DIAGNOSIS — R2689 Other abnormalities of gait and mobility: Secondary | ICD-10-CM

## 2019-07-26 DIAGNOSIS — Z9181 History of falling: Secondary | ICD-10-CM

## 2019-07-26 DIAGNOSIS — M6281 Muscle weakness (generalized): Secondary | ICD-10-CM | POA: Diagnosis not present

## 2019-07-26 NOTE — Therapy (Signed)
[facility] [address] [place], [name], [address] Phone: [phone]   Fax:  [phone]  Physical Therapy Treatment  Patient Details  Name: Pamela Stein MRN: [id number] Date of Birth: 1955-07-13 Referring Provider (PT): Phillips Odor  PHYSICAL THERAPY DISCHARGE SUMMARY  Visits from Start of Care: 16  Current functional level related to goals / functional outcomes: See below   Remaining deficits: Balance    Education / Equipment: Walking, balance exercises  Plan: Patient agrees to discharge.  Patient goals were met. Patient is being discharged due to meeting the stated rehab goals.  ?????     Encounter Date: 07/26/2019   PT End of Session - 07/26/19 1050    Visit Number 16    Number of Visits 16    Date for PT Re-Evaluation 07/28/19    Authorization Type [employer]    Authorization - Visit Number 17    Authorization - Number of Visits 50    Progress Note Due on Visit 16    PT Start Time 1005    PT Stop Time 1045    PT Time Calculation (min) 40 min    Activity Tolerance Patient tolerated treatment well    Behavior During Therapy WFL for tasks assessed/performed           Past Medical History:  Diagnosis Date  . Anxiety   . Back pain   . Bronchitis   . Complication of anesthesia    stopped breathing after endometrial ablation procedure prior to her being diagn. with OSA  . Diabetes mellitus   . Dizziness   . Heart murmur   . History of gout   . Hyperlipidemia   . Hypertension   . Neuropathy   . Obesity   . Obstructive sleep apnea    CPAP    Past Surgical History:  Procedure Laterality Date  . ABDOMINAL HYSTERECTOMY    . ADENOIDECTOMY    . CATARACT EXTRACTION Left    right 02/2018  . COLONOSCOPY  2008   diminutive rectal polyp, s/p removal. polypoid mucosa  . COLONOSCOPY WITH PROPOFOL N/A 01/03/2018   Procedure: COLONOSCOPY WITH PROPOFOL;  Surgeon: Daneil Dolin, MD;  [facility];   Service: Endoscopy;  Laterality: N/A;  12:00pm  . DILATION AND CURETTAGE OF UTERUS    . ENDOMETRIAL ABLATION    . EYE SURGERY Left 12/04/2013   cataract  . PARATHYROIDECTOMY N/A 12/22/2016   Procedure: PARATHYROIDECTOMY, NECK EXPLORATION;  Surgeon: Jackolyn Confer, MD;  Location: [facility];  Service: General;  Laterality: N/A;  . TONSILLECTOMY      There were no vitals filed for this visit.   Subjective Assessment - 07/26/19 1014    Subjective PT states that her discomfort is better, she is walking better.    Pertinent History Back pain, Lt knee pain, HTN    Limitations Standing;Lifting;Walking;House hold activities    How long can you sit comfortably? no problem    How long can you stand comfortably? 1-1.5 hours was 15 minutes    How long can you walk comfortably? 1 hour at grocery store was fatigued now she is not fatigued    Diagnostic tests IMPRESSION:1. Multilevel degenerative changes of the cervical spine withposterior disc protrusions at C3-4 through C5-6 causing flatteningof the anterior surface of the spinal cord and resulting in mildspinal canal stenosis at C3-4 and C4-5. No spinal cord signalabnormality.2. No high-grade neural foraminal narrowing.3. A 4 mm T1 hyperintense, T2 hypointense structure within thepituitary  gland, just posterior to the pituitary stalk, likelyrepresenting a Rathke's cleft cyst.    Patient Stated Goals To walk better, stop falling and less knee pain, be safer going up and down steps;  all met    Currently in Pain? Yes    Pain Score 1     Pain Location Knee    Pain Orientation Left;Anterior    Pain Descriptors / Indicators Aching    Pain Type Chronic pain    Pain Onset More than a month ago    Pain Frequency Intermittent              [facility] PT Assessment - 07/26/19 0001      Assessment   Medical Diagnosis History of falling     Referring Provider (PT) Kofi [place]on[place]ah    Onset Date/Surgical Date 05/10/17    Next MD Visit 07/04/19    Prior Therapy  none      Precautions   Precautions Fall      Restrictions   Weight Bearing Restrictions No      [place]      Prior Function   Level of Independence Independent    Vocation Full time employment    Vocation Requirements sit/stand at post office     Leisure singing       Cognition   Overall Cognitive Status Within Functional Limits for tasks assessed      Single Leg Stance   Comments RT was 10" now 30; Lt was 4 " max 20"      ROM / Strength   AROM / PROM / Strength AROM      AROM   Left Knee Extension 0   was 2   Left Knee Flexion 125   was 110     Strength   Right Hip Flexion 5/5   was 4+/5   Left Hip Flexion --   5-/5;   Right Knee Flexion 5/5    Right Knee Extension 5/5    Left Knee Flexion 5/5   was 4   Left Knee Extension 5/5   was 4   Right Ankle Dorsiflexion 5/5    Left Ankle Dorsiflexion 5/5      Transfers   Five time sit to stand comments  13.78 waas 23 sec with no UE       Ambulation/Gait   Ambulation Distance (Feet) 668 Feet   was 276     Balance   Balance Assessed Yes      Static Standing Balance   Static Standing Balance -  Activities  Single Leg Stance - Right Leg;Single Leg Stance - Left Leg                         [facility] Adult PT Treatment/Exercise - 07/26/19 0001      Lumbar Exercises: Standing   Other Standing Lumbar Exercises singl leg stance x 5 B       Lumbar Exercises: Seated   Sit to Stand 10 reps                    PT Short Term Goals - 07/26/19 1033      PT SHORT TERM GOAL #1   Title Pt to state that Lt knee pain is no greater than 2/10 to allow pt to ambulate for 25 mintues without difficulty to allow shopping without difficulty    Baseline reports able to walk 1 hour,  current pain 2/10    Time 3    Period Weeks    Status Achieved    Target Date 06/26/19      PT SHORT TERM GOAL #2   Title Pt to be able to single leg stance for 25" on both LE to  reduce risk of falling    Baseline current: RT 15 sec, LT 11 sec    Time 3    Period Weeks    Status Partially Met      PT SHORT TERM GOAL #3   Title PT LE and core strength to have increased to allow pt to be able to come sit to stand with ease and be able to complete 8 sit to stand in a 30 second period    Time 3    Period Weeks    Status Achieved      PT SHORT TERM GOAL #4   Title Pt Lt knee extension to be 5 degees or less to allow a more normalized gait pattern    Baseline current: 2 degrees    Time 3    Period Weeks    Status Achieved             PT Long Term Goals - 07/26/19 1034      PT LONG TERM GOAL #1   Title PT to be able to single leg stance for 45" on both LE in order to have no falls in the past three weeks.    Baseline current: RT 15 sec, LT 11 sec    Time 6    Period Weeks    Status On-going      PT LONG TERM GOAL #2   Title Pt  LE and core strength to increase to allow pt to be able to ascend and descend 8 steps in a reciprocal manner with one handrail without difficulty.    Baseline Able to perform well with ascent, some weakness noted with descent, poor control with reciprocal pattern    Time 6    Period Weeks    Status Partially Met      PT LONG TERM GOAL #3   Title Pt Lt knee extension to be 3 or less and gait to be normalized by increasing ankle and knee flexion while ambulating    Baseline Current: 2 degrees    Time 6    Period Weeks    Status Achieved      PT LONG TERM GOAL #4   Title PT to be able to walk for 60 mintues or more without difficulty to allow pt to shop without hesitation.    Baseline Reports able to walk at grocery store for about 1 hour    Time 6    Period Weeks    Status Achieved                 Plan - 07/26/19 1050    Clinical Impression Statement Pt reassessed with significant improvement in all areas.  Therapist explained the need to keep walking on a regular basis and to work on balance on a daily basis.  Pt  is ready for discharge.    Personal Factors and Comorbidities Comorbidity 1;Fitness;Past/Current Experience    Comorbidities see MRI    Examination-Activity Limitations Carry;Lift;Locomotion Level;Stairs;Stand    Examination-Participation Restrictions Cleaning;Community Activity;Laundry;Shop    Stability/Clinical Decision Making Evolving/Moderate complexity    Rehab Potential Good    PT Frequency 2x / week    PT Duration 4 weeks  PT Treatment/Interventions ADLs/Self Care Home Management;Therapeutic exercise;Therapeutic activities;Balance training;Neuromuscular re-education;Patient/family education;Manual techniques;Gait training;Stair training;Functional mobility training;Aquatic Therapy    PT Next Visit Plan Discharge    PT Home Exercise Plan scapular and cervical retraction, Lt LAQ, Lt Quad set, bridge.; 5: 12:  sit to stand, heel raises, functional squat and lunges; 5/26: tband postural exercises . wall squats           Patient will benefit from skilled therapeutic intervention in order to improve the following deficits and impairments:  Abnormal gait, Decreased activity tolerance, Decreased balance, Decreased coordination, Decreased range of motion, Decreased strength, Difficulty walking, Pain  Visit Diagnosis: Muscle weakness (generalized)  Other abnormalities of gait and mobility  History of falling     Problem List Patient Active Problem List   Diagnosis Date Noted  . Polyneuropathy due to secondary diabetes mellitus ([place]) 05/25/2019  . Arthritis of right sacroiliac joint 05/25/2019  . Cervical disc disorder 05/25/2019  . Falls 05/25/2019  . General unsteadiness 05/25/2019  . Encounter for well woman exam with routine gynecological exam 01/09/2019  . Screening for colorectal cancer 01/09/2019  . Encounter for screening colonoscopy 12/13/2017  . Hyperparathyroidism, primary ([place]) 12/22/2016  . Vitamin D deficiency 10/31/2015  . Hyperuricemia 05/08/2013  .  ONYCHOMYCOSIS, TOENAILS 02/22/2009  . NEVI, MULTIPLE 02/22/2009  . Diabetes mellitus ([place]) 01/25/2008  . BACK PAIN WITH RADICULOPATHY 11/09/2007  . Hyperlipidemia LDL goal <100 08/26/2007  . Morbid obesity ([place]) 08/26/2007  . Essential hypertension 08/26/2007  . Obstructive sleep apnea 04/20/2007  Rayetta Humphrey, PT CLT [phone] 07/26/2019, 10:53 AM  [facility] [address] [place], [name], [address] Phone: [phone]   Fax:  [phone]  Name: ANGELEEN HORNEY MRN: [id number] Date of Birth: 31-Mar-1955

## 2019-07-31 ENCOUNTER — Other Ambulatory Visit: Payer: Self-pay

## 2019-07-31 ENCOUNTER — Ambulatory Visit (HOSPITAL_COMMUNITY)
Admission: RE | Admit: 2019-07-31 | Discharge: 2019-07-31 | Disposition: A | Discharge status: Home / Self Care | Payer: Federal, State, Local not specified - PPO | Source: Ambulatory Visit | Attending: Orthopaedic Surgery | Admitting: Orthopaedic Surgery

## 2019-07-31 DIAGNOSIS — M25562 Pain in left knee: Secondary | ICD-10-CM | POA: Insufficient documentation

## 2019-07-31 DIAGNOSIS — G8929 Other chronic pain: Secondary | ICD-10-CM | POA: Diagnosis not present

## 2019-07-31 DIAGNOSIS — M25362 Other instability, left knee: Secondary | ICD-10-CM | POA: Diagnosis not present

## 2019-07-31 IMAGING — MR MR KNEE*L* W/O CM
6 series · 39 of 40 positions shown · non-contrast
Comparison: Radiographs dated [DATE]

CLINICAL DATA: Chronic left knee pain and instability. Multiple
falls.

EXAM:
MRI OF THE LEFT KNEE WITHOUT CONTRAST
TECHNIQUE: Multiplanar, multisequence MR imaging of the knee was performed. No
intravenous contrast was administered.

[Series 5: T2 fat-sat · axial · left · 4.0mm · 0.50mm/px · z∈[-111,+27]mm · 8 of 33 slices shown (1 of 3)]
[im 1/33]
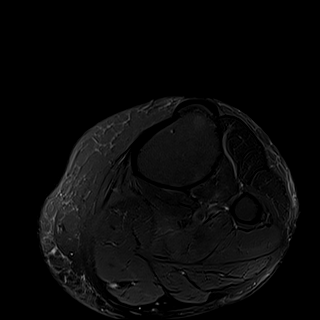
[im 5/33]
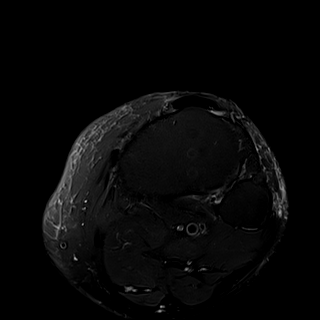
[im 10/33]
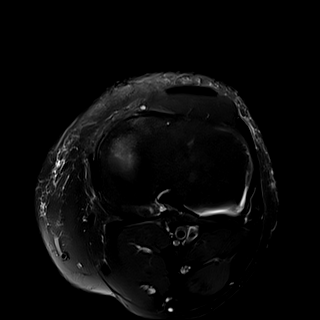
[im 14/33]
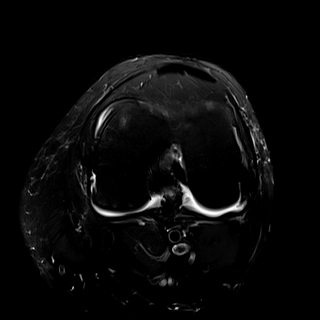
[im 19/33]
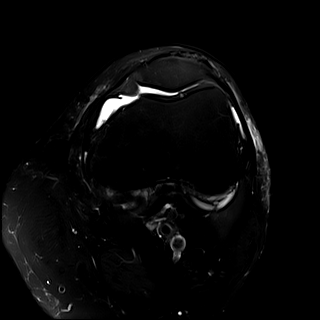
[im 23/33]
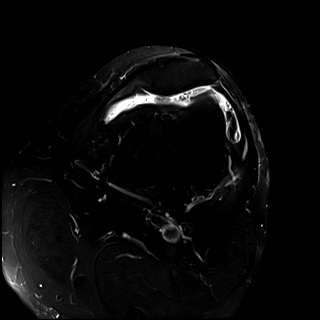
[im 28/33]
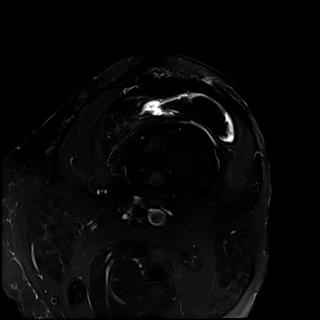
[im 33/33]
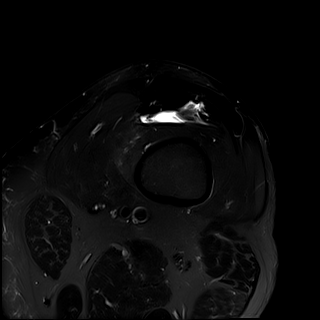

[Series 6: T1 · coronal · left · 4.0mm · 0.29mm/px · 5 of 25 slices shown]
[im 1/25]
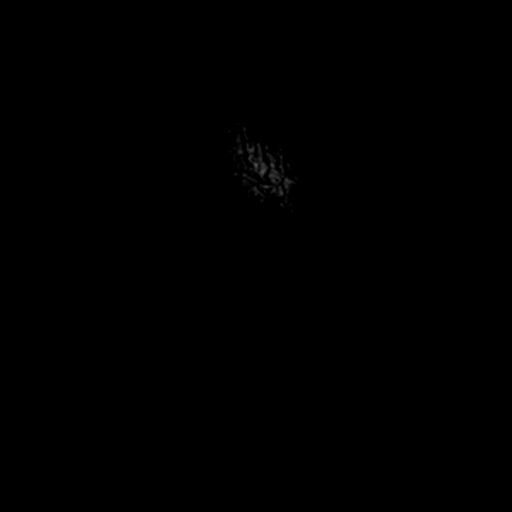
[im 5/25]
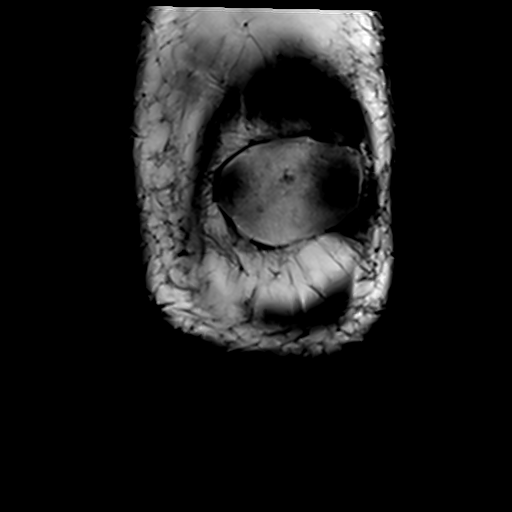
[im 10/25]
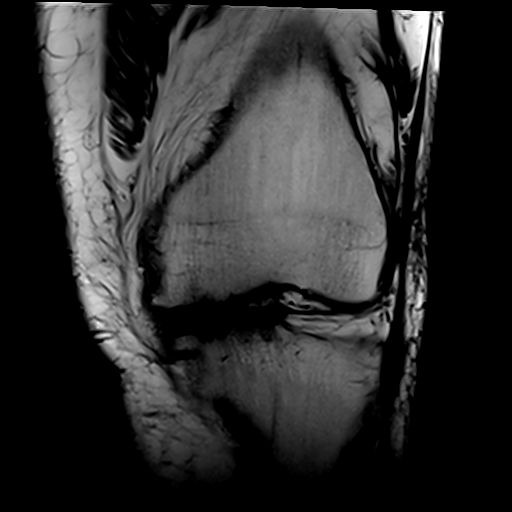
[im 15/25]
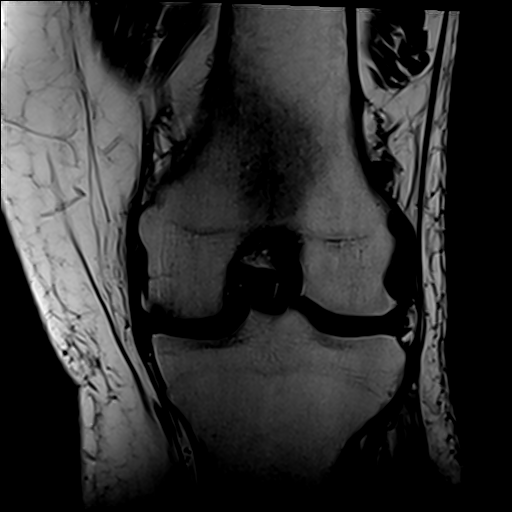
[im 20/25]
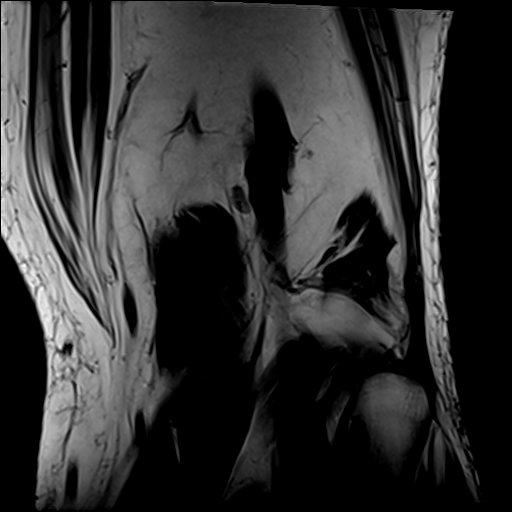

[Series 7: T2 fat-sat · coronal · left · 4.0mm · 0.59mm/px · 6 of 25 slices shown (2 of 3)]
[im 1/25]
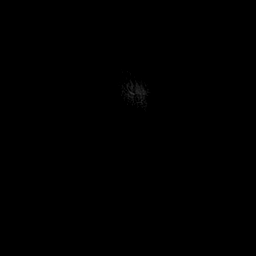
[im 5/25]
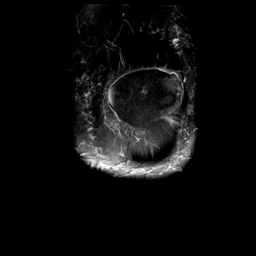
[im 10/25]
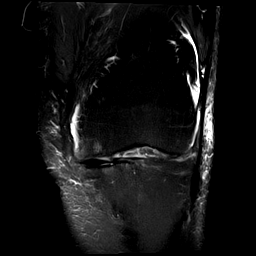
[im 15/25]
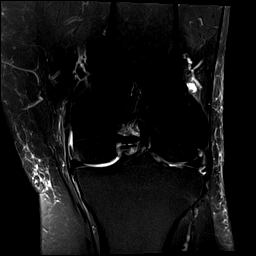
[im 20/25]
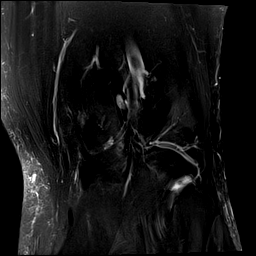
[im 25/25]
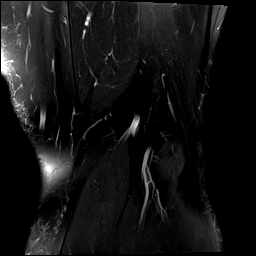

[Series 8: PD fat-sat · coronal · left · 3.0mm · 0.47mm/px · 8 of 32 slices shown (1 of 2)]
[im 1/32]
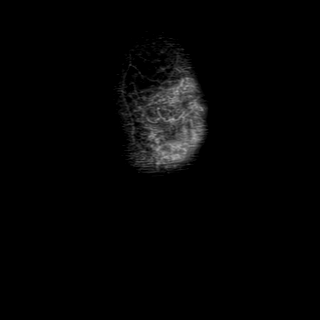
[im 5/32]
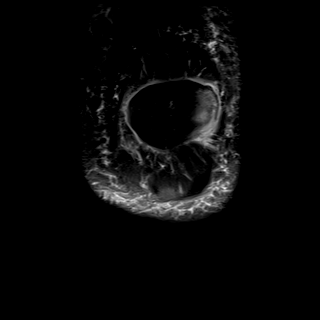
[im 9/32]
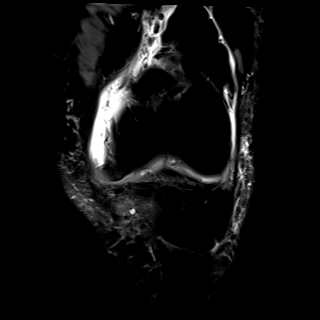
[im 14/32]
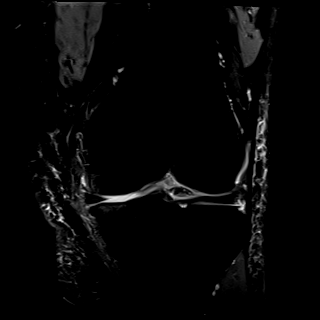
[im 18/32]
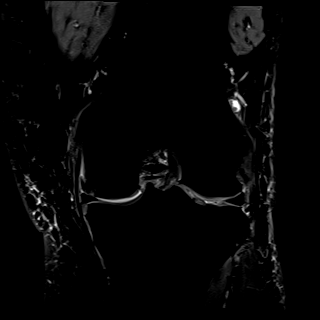
[im 23/32]
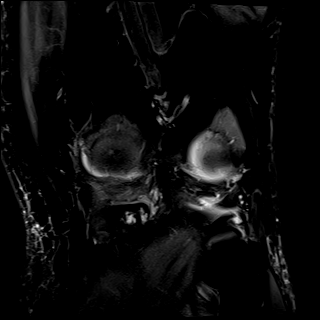
[im 27/32]
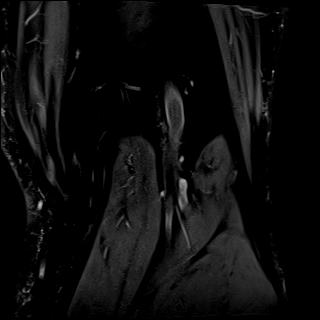
[im 32/32]
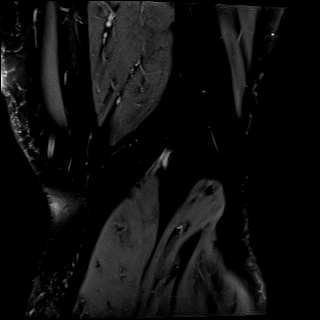

[Series 9: PD fat-sat · sagittal · left · 4.0mm · 0.47mm/px · 6 of 22 slices shown (2 of 2)]
[im 1/22]
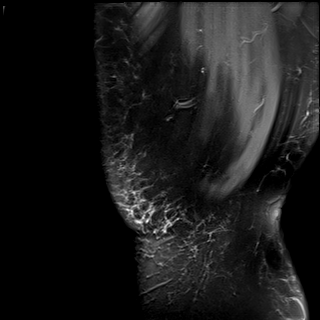
[im 5/22]
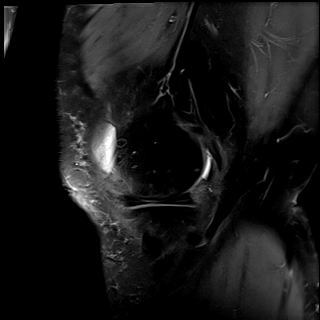
[im 9/22]
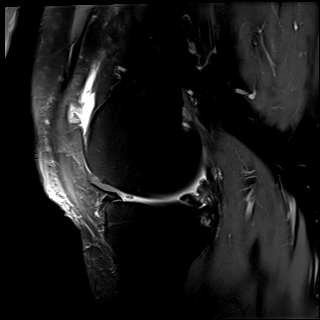
[im 13/22]
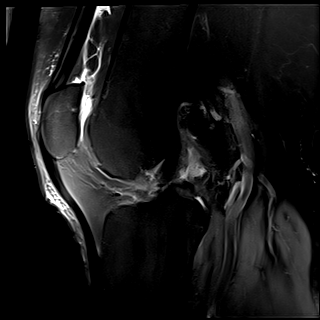
[im 17/22]
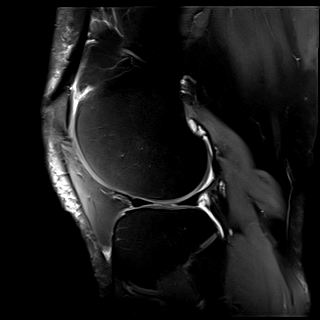
[im 22/22]
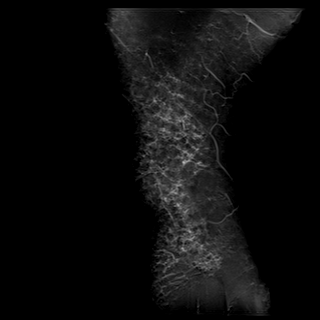

[Series 10: T2 fat-sat · sagittal · left · 4.0mm · 0.47mm/px · 6 of 22 slices shown (3 of 3)]
[im 1/22]
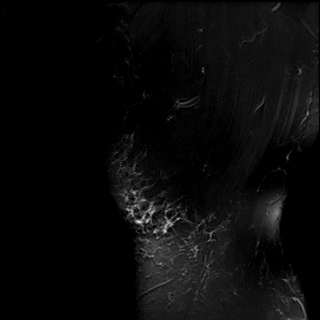
[im 5/22]
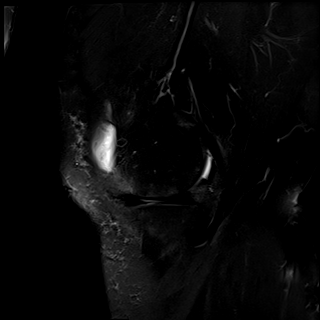
[im 9/22]
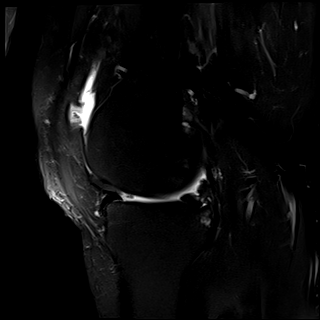
[im 13/22]
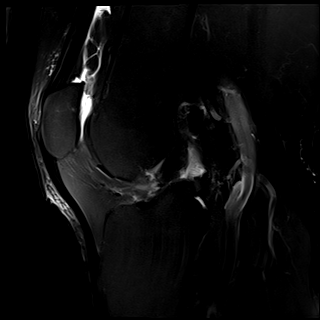
[im 17/22]
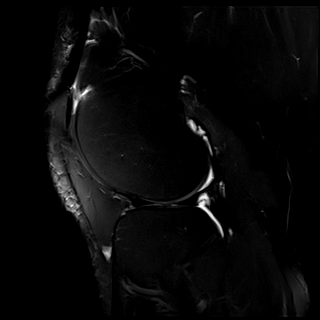
[im 22/22]
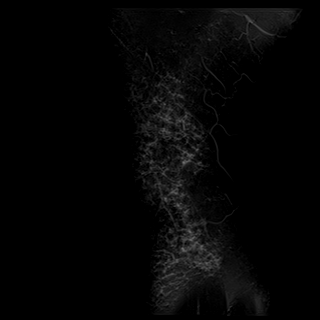

[39 of 40 positions shown; findings below may reference images not displayed]

FINDINGS: MENISCI

Medial meniscus:  Intact.

Lateral meniscus:  Intact.

LIGAMENTS

Cruciates:  Normal.

Collaterals:  Normal.

CARTILAGE

Patellofemoral: Extensive thinning of the articular cartilage of the
patella with slight thinning of the articular cartilage of the
trochlear groove.

Medial: Extensive denuding of the articular cartilage of the tibial
plateau and of the majority of the femoral condyle.

Lateral: Irregular thinning of the articular cartilage of the
central portion of the tibial plateau. The articular cartilage of
the femoral condyle appears normal.

Joint: Moderate joint effusion. Normal Hoffa's fat pad. No plical
thickening.

Popliteal Fossa:  No Baker cyst. Intact popliteus tendon.

Extensor Mechanism:  Normal.

Bones: Small tricompartmental marginal osteophytes, most prominent
in the medial compartment. Slight subcortical edema in the medial
compartment. Small degenerative cyst in the posterior aspect of the
medial tibial plateau.

Other: None
IMPRESSION: 1. Moderate osteoarthritis of the medial compartment with extensive
denuding of the articular cartilage of the medial femoral condyle
and tibial plateau.
2. Moderate joint effusion.
3. Slight chondromalacia of the patellofemoral and lateral
compartments.

## 2019-08-01 ENCOUNTER — Ambulatory Visit: Payer: Federal, State, Local not specified - PPO | Admitting: Orthopaedic Surgery

## 2019-08-03 ENCOUNTER — Other Ambulatory Visit: Payer: Self-pay

## 2019-08-03 ENCOUNTER — Encounter: Payer: Self-pay | Admitting: Orthopaedic Surgery

## 2019-08-03 ENCOUNTER — Ambulatory Visit (INDEPENDENT_AMBULATORY_CARE_PROVIDER_SITE_OTHER): Payer: Federal, State, Local not specified - PPO | Admitting: Orthopaedic Surgery

## 2019-08-03 VITALS — BP 143/83 | HR 69 | Ht 62.5 in | Wt 221.2 lb

## 2019-08-03 DIAGNOSIS — G8929 Other chronic pain: Secondary | ICD-10-CM

## 2019-08-03 DIAGNOSIS — M25562 Pain in left knee: Secondary | ICD-10-CM

## 2019-08-03 NOTE — Progress Notes (Signed)
Patient DP:OEUMPNTIR Pamela Stein, female DOB:09/17/1955, 64 y.o. WER:[id number]  Chief Complaint  Patient presents with  . Knee Pain    L/doing ok, pain is maybe a 1 today on pain scale    HPI  Pamela Stein is a 64 y.o. female who has left knee pain.  She had MRI which showed: IMPRESSION: 1. Moderate osteoarthritis of the medial compartment with extensive denuding of the articular cartilage of the medial femoral condyle and tibial plateau. 2. Moderate joint effusion. 3. Slight chondromalacia of the patellofemoral and lateral Compartments.  She is candidate for a total knee.  I have explained the findings to her.  I will have her take one Aleve twice a day.     Body mass index is 39.82 kg/m.  ROS  Review of Systems  Constitutional: Positive for activity change.  Musculoskeletal: Positive for arthralgias, back pain, gait problem and joint swelling.  Psychiatric/Behavioral: The patient is nervous/anxious.   All other systems reviewed and are negative.   All other systems reviewed and are negative.  The following is a summary of the past history medically, past history surgically, known current medicines, social history and family history.  This information is gathered electronically by the computer from prior information and documentation.  I review this each visit and have found including this information at this point in the chart is beneficial and informative.    Past Medical History:  Diagnosis Date  . Anxiety   . Back pain   . Bronchitis   . Complication of anesthesia    stopped breathing after endometrial ablation procedure prior to her being diagn. with OSA  . Diabetes mellitus   . Dizziness   . Heart murmur   . History of gout   . Hyperlipidemia   . Hypertension   . Neuropathy   . Obesity   . Obstructive sleep apnea    CPAP    Past Surgical History:  Procedure Laterality Date  . ABDOMINAL HYSTERECTOMY    . ADENOIDECTOMY    . CATARACT EXTRACTION  Left    right 02/2018  . COLONOSCOPY  2008   diminutive rectal polyp, s/p removal. polypoid mucosa  . COLONOSCOPY WITH PROPOFOL N/A 01/03/2018   Procedure: COLONOSCOPY WITH PROPOFOL;  Surgeon: Daneil Dolin, MD;  [facility];  Service: Endoscopy;  Laterality: N/A;  12:00pm  . DILATION AND CURETTAGE OF UTERUS    . ENDOMETRIAL ABLATION    . EYE SURGERY Left 12/04/2013   cataract  . PARATHYROIDECTOMY N/A 12/22/2016   Procedure: PARATHYROIDECTOMY, NECK EXPLORATION;  Surgeon: Jackolyn Confer, MD;  Location: [facility];  Service: General;  Laterality: N/A;  . TONSILLECTOMY      Family History  Problem Relation Age of Onset  . Cancer Father        throat  . Throat cancer Father   . Hypertension Father   . Diabetes Father   . Diabetes Maternal Grandmother   . Hypertension Maternal Grandfather   . Hypertension Paternal Grandmother   . Diabetes Paternal Grandfather   . Hypertension Mother   . Diabetes Mother   . Cancer Paternal Aunt   . Colon cancer Neg Hx   . Colon polyps Neg Hx     Social History Social History   Tobacco Use  . Smoking status: Never Smoker  . Smokeless tobacco: Never Used  Vaping Use  . Vaping Use: Never used  Substance Use Topics  . Alcohol use: No    Alcohol/week: 0.0 standard drinks  .  Drug use: No    Allergies  Allergen Reactions  . Ace Inhibitors Cough    Current Outpatient Medications  Medication Sig Dispense Refill  . albuterol (PROVENTIL HFA;VENTOLIN HFA) 108 (90 Base) MCG/ACT inhaler Inhale 1-2 puffs into the lungs every 6 (six) hours as needed for wheezing or shortness of breath. 1 Inhaler 0  . amLODipine (NORVASC) 10 MG tablet TAKE 1 TABLET BY MOUTH DAILY (Patient taking differently: Take 10 mg by mouth daily. ) 30 tablet 4  . aspirin EC 81 MG tablet Take 81 mg by mouth daily.    . DULoxetine (CYMBALTA) 60 MG capsule Take 1 capsule every day by oral route.    . furosemide (LASIX) 20 MG tablet Take 20 mg by mouth daily.   3  .  gabapentin (NEURONTIN) 300 MG capsule Take 1 capsule (300 mg total) by mouth 3 (three) times daily. (Patient taking differently: Take 300 mg by mouth daily. ) 90 capsule 3  . losartan (COZAAR) 25 MG tablet Take 25 mg by mouth daily.   3  . metFORMIN (GLUCOPHAGE) 500 MG tablet TAKE 1 TABLET BY MOUTH TWICE DAILY WITH A MEAL (Patient taking differently: Take 500 mg by mouth daily with lunch. ) 180 tablet 1  . pravastatin (PRAVACHOL) 80 MG tablet TAKE 1 TABLET(80 MG) BY MOUTH EVERY EVENING (Patient taking differently: Take 80 mg by mouth every evening. ) 90 tablet 0  . spironolactone (ALDACTONE) 25 MG tablet Take 25 mg by mouth daily.   3   No current facility-administered medications for this visit.     Physical Exam  Blood pressure (!) 143/83, pulse 69, height 5' 2.5" (1.588 m), weight 221 lb 4 oz (100.4 kg).  Constitutional: overall normal hygiene, normal nutrition, well developed, normal grooming, normal body habitus. Assistive device:none  Musculoskeletal: gait and station Limp left, muscle tone and strength are normal, no tremors or atrophy is present.  .  Neurological: coordination overall normal.  Deep tendon reflex/nerve stretch intact.  Sensation normal.  Cranial nerves II-XII intact.   Skin:   Normal overall no scars, lesions, ulcers or rashes. No psoriasis.  Psychiatric: Alert and oriented x 3.  Recent memory intact, remote memory unclear.  Normal mood and affect. Well groomed.  Good eye contact.  Cardiovascular: overall no swelling, no varicosities, no edema bilaterally, normal temperatures of the legs and arms, no clubbing, cyanosis and good capillary refill.  Lymphatic: palpation is normal.  Left knee with medial pain, ROM 0 to 105, limp left, crepitus and slight effusion present.  NV intact.  All other systems reviewed and are negative   The patient has been educated about the nature of the problem(s) and counseled on treatment options.  The patient appeared to understand  what I have discussed and is in agreement with it.  Encounter Diagnosis  Name Primary?  . Chronic pain of left knee Yes    PLAN Call if any problems.  Precautions discussed.  Continue current medications.   Return to clinic 2 months   Electronically [place], MD 6/24/20218:29 AM

## 2019-08-28 DIAGNOSIS — R296 Repeated falls: Secondary | ICD-10-CM | POA: Diagnosis not present

## 2019-08-28 DIAGNOSIS — M130 Polyarthritis, unspecified: Secondary | ICD-10-CM | POA: Diagnosis not present

## 2019-08-28 DIAGNOSIS — E1142 Type 2 diabetes mellitus with diabetic polyneuropathy: Secondary | ICD-10-CM | POA: Diagnosis not present

## 2019-08-28 DIAGNOSIS — G4733 Obstructive sleep apnea (adult) (pediatric): Secondary | ICD-10-CM | POA: Diagnosis not present

## 2019-09-05 DIAGNOSIS — E119 Type 2 diabetes mellitus without complications: Secondary | ICD-10-CM | POA: Diagnosis not present

## 2019-09-13 ENCOUNTER — Other Ambulatory Visit: Payer: Self-pay

## 2019-09-13 ENCOUNTER — Encounter (HOSPITAL_COMMUNITY): Payer: Self-pay | Admitting: Physical Therapy

## 2019-09-13 ENCOUNTER — Ambulatory Visit (HOSPITAL_COMMUNITY): Payer: Federal, State, Local not specified - PPO | Attending: Neurology | Admitting: Physical Therapy

## 2019-09-13 DIAGNOSIS — Z9181 History of falling: Secondary | ICD-10-CM | POA: Insufficient documentation

## 2019-09-13 DIAGNOSIS — R2689 Other abnormalities of gait and mobility: Secondary | ICD-10-CM | POA: Insufficient documentation

## 2019-09-13 DIAGNOSIS — M6281 Muscle weakness (generalized): Secondary | ICD-10-CM | POA: Insufficient documentation

## 2019-09-13 NOTE — Therapy (Signed)
[place], [name], [address] Phone: [phone]   Fax:  [phone]  Physical Therapy Evaluation  Patient Details  Name: Pamela Stein MRN: [id number] Date of Birth: 08-04-1955 Referring Provider (PT): Phillips Odor   Encounter Date: 09/13/2019   PT End of Session - 09/13/19 0849    Visit Number 1    Number of Visits 8    Date for PT Re-Evaluation 10/13/19    Authorization Type BCBS Federal EMP PPO (50 VL, 34 remain)    Authorization - Visit Number 1    Authorization - Number of Visits 34    Progress Note Due on Visit 8    PT Start Time 0830    PT Stop Time 0900    PT Time Calculation (min) 30 min    Equipment Utilized During Treatment Gait belt    Activity Tolerance Patient tolerated treatment well;Patient limited by pain    Behavior During Therapy [facility] for tasks assessed/performed           Past Medical History:  Diagnosis Date  . Anxiety   . Back pain   . Bronchitis   . Complication of anesthesia    stopped breathing after endometrial ablation procedure prior to her being diagn. with OSA  . Diabetes mellitus   . Dizziness   . Heart murmur   . History of gout   . Hyperlipidemia   . Hypertension   . Neuropathy   . Obesity   . Obstructive sleep apnea    CPAP    Past Surgical History:  Procedure Laterality Date  . ABDOMINAL HYSTERECTOMY    . ADENOIDECTOMY    . CATARACT EXTRACTION Left    right 02/2018  . COLONOSCOPY  2008   diminutive rectal polyp, s/p removal. polypoid mucosa  . COLONOSCOPY WITH PROPOFOL N/A 01/03/2018   Procedure: COLONOSCOPY WITH PROPOFOL;  Surgeon: Daneil Dolin, MD;  [facility];  Service: Endoscopy;  Laterality: N/A;  12:00pm  . DILATION AND CURETTAGE OF UTERUS    . ENDOMETRIAL ABLATION    . EYE SURGERY Left 12/04/2013   cataract  . PARATHYROIDECTOMY N/A 12/22/2016   Procedure: PARATHYROIDECTOMY, NECK EXPLORATION;  Surgeon: Jackolyn Confer, MD;   Location: [facility];  Service: General;  Laterality: N/A;  . TONSILLECTOMY      There were no vitals filed for this visit.    Subjective Assessment - 09/13/19 0833    Subjective Patient presents to therapy with complaint of falls. Patient says she has been falling recently due to her LT knee. Patient says she is supposed to have surgery on her knee but is not sure if she wants this right now. Says therapy was very helpful for her knee last time and wants to continue working on this and balance to avoid farther falls.    Limitations Standing;Lifting;Walking;House hold activities    Patient Stated Goals avoid falls    Currently in Pain? Yes    Pain Score 4     Pain Location Knee    Pain Orientation Left    Pain Descriptors / Indicators Aching;Throbbing    Pain Type Chronic pain    Pain Onset More than a month ago    Pain Frequency Constant    Aggravating Factors  walking, steps    Pain Relieving Factors rest    Effect of Pain on Daily Activities limits              [facility] PT Assessment -  09/13/19 0001      Assessment   Medical Diagnosis History of falling     Referring Provider (PT) Kofi Doonquah    Onset Date/Surgical Date --   July 2021   Next MD Visit 10/02/19    Prior Therapy Yes for LT knee       Precautions   Precautions Fall      Restrictions   Weight Bearing Restrictions No      Balance Screen   Has the patient fallen in the past 6 months Yes    How many times? 7    Has the patient had a decrease in activity level because of a fear of falling?  Yes    Is the patient reluctant to leave their home because of a fear of falling?  No      Home Ecologist residence      Prior Function   Level of Independence Independent    Vocation Full time employment    Vocation Requirements sit/stand at post office       Cognition   Overall Cognitive Status Within Functional Limits for tasks assessed      Transfers   Five time sit to stand  comments  26.7 sec with no UE       Ambulation/Gait   Ambulation/Gait Yes    Ambulation/Gait Assistance 6: Modified independent (Device/Increase time)    Ambulation Distance (Feet) 240 Feet    Assistive device None    Gait Pattern Decreased stance time - left;Decreased step length - left;Antalgic    Ambulation Surface Level;Indoor    Gait Comments 2MWT                      Objective measurements completed on examination: See above findings.               PT Education - 09/13/19 0840    Education Details On evaluation findings and POC    Person(s) Educated Patient    Methods Explanation    Comprehension Verbalized understanding            PT Short Term Goals - 09/13/19 0856      PT SHORT TERM GOAL #1   Title Patient will be independent with initial HEP and self-management strategies to improve functional outcomes    Time 2    Period Weeks    Status New    Target Date 09/29/19             PT Long Term Goals - 09/13/19 0856      PT LONG TERM GOAL #1   Title Patient will be able to perform stand x 5 in < 15 seconds to demonstrate improvement in functional mobility and reduced risk for falls.    Time 4    Period Weeks    Status New    Target Date 10/13/19      PT LONG TERM GOAL #2   Title Patient will report at least 60% overall improvement in subjective complaint to indicate improvement in ability to perform ADLs.    Time 4    Period Weeks    Status New    Target Date 10/13/19      PT LONG TERM GOAL #3   Title Patient will be able to ambulate at least 300 feet during 2MWT with LRAD to demonstrate improved ability to perform functional mobility and associated tasks.    Time 4    Period Weeks  Status New    Target Date 10/13/19                  Plan - 09/13/19 0851    Clinical Impression Statement Patient is a 64 y.o. female who presents to physical therapy with complaint of recent falls. Patient demonstrates decreased  strength, balance deficits and gait abnormalities which are likely contributing to symptoms of pain and are negatively impacting patient ability to perform ADLs and functional mobility tasks. Patient will benefit from skilled physical therapy services to address these deficits to reduce pain, improve level of function with ADLs, functional mobility tasks, and reduce risk for falls.    Personal Factors and Comorbidities Comorbidity 1;Fitness;Past/Current Experience    Examination-Participation Restrictions Cleaning;Community Activity;Laundry;Shop    Stability/Clinical Decision Making Stable/Uncomplicated    Clinical Decision Making Low    Rehab Potential Good    PT Frequency 2x / week    PT Duration 4 weeks    PT Treatment/Interventions ADLs/Self Care Home Management;Therapeutic exercise;Therapeutic activities;Balance training;Neuromuscular re-education;Patient/family education;Manual techniques;Gait training;Stair training;Functional mobility training;Aquatic Therapy;Biofeedback;Cryotherapy;Electrical Stimulation;Iontophoresis 4mg /ml Dexamethasone;Moist Heat;Vasopneumatic Device;Taping;Splinting;Energy conservation;Orthotic Fit/Training;Dry needling;Passive range of motion;Spinal Manipulations;Joint Manipulations;Compression bandaging;Scar mobilization;Cognitive remediation;Ultrasound;Parrafin;Fluidtherapy;Contrast Bath;DME Instruction    PT Next Visit Plan Review goals and issue HEP. Progress LE strength and balance as tolerated. Progress to standing strength and gait as able. Possibly trial aquatic therapy    PT Home Exercise Plan Issue next visit    Consulted and Agree with Plan of Care Patient           Patient will benefit from skilled therapeutic intervention in order to improve the following deficits and impairments:  Abnormal gait, Decreased activity tolerance, Decreased balance, Decreased range of motion, Decreased strength, Difficulty walking, Pain, Decreased endurance, Decreased  mobility  Visit Diagnosis: Other abnormalities of gait and mobility  Muscle weakness (generalized)     Problem List Patient Active Problem List   Diagnosis Date Noted  . Polyneuropathy due to secondary diabetes mellitus ([place]) 05/25/2019  . Arthritis of right sacroiliac joint 05/25/2019  . Cervical disc disorder 05/25/2019  . Falls 05/25/2019  . General unsteadiness 05/25/2019  . Encounter for well woman exam with routine gynecological exam 01/09/2019  . Screening for colorectal cancer 01/09/2019  . Encounter for screening colonoscopy 12/13/2017  . Hyperparathyroidism, primary ([place]) 12/22/2016  . Vitamin D deficiency 10/31/2015  . Hyperuricemia 05/08/2013  . ONYCHOMYCOSIS, TOENAILS 02/22/2009  . NEVI, MULTIPLE 02/22/2009  . Diabetes mellitus ([place]) 01/25/2008  . BACK PAIN WITH RADICULOPATHY 11/09/2007  . Hyperlipidemia LDL goal <100 08/26/2007  . Morbid obesity ([place]) 08/26/2007  . Essential hypertension 08/26/2007  . Obstructive sleep apnea 04/20/2007    8:59 AM, 09/13/19 Josue Hector PT DPT  Physical Therapist with [facility]  [phone] [place] [address] [place], [name], [address] Phone: [phone]   Fax:  [phone]  Name: DAZIYAH COGAN MRN: [id number] Date of Birth: December 20, 1955

## 2019-09-19 ENCOUNTER — Ambulatory Visit (HOSPITAL_COMMUNITY): Payer: Federal, State, Local not specified - PPO | Admitting: Physical Therapy

## 2019-09-19 ENCOUNTER — Other Ambulatory Visit: Payer: Self-pay

## 2019-09-19 ENCOUNTER — Encounter (HOSPITAL_COMMUNITY): Payer: Self-pay | Admitting: Physical Therapy

## 2019-09-19 ENCOUNTER — Telehealth (HOSPITAL_COMMUNITY): Payer: Self-pay | Admitting: Physical Therapy

## 2019-09-19 DIAGNOSIS — R2689 Other abnormalities of gait and mobility: Secondary | ICD-10-CM

## 2019-09-19 DIAGNOSIS — M6281 Muscle weakness (generalized): Secondary | ICD-10-CM | POA: Diagnosis not present

## 2019-09-19 DIAGNOSIS — Z9181 History of falling: Secondary | ICD-10-CM

## 2019-09-19 NOTE — Patient Instructions (Addendum)
Tandem Stance    Stand near countertop for support if needed. Right foot in front of left, heel touching toe both feet "straight ahead". Stand on Foot Triangle of Support with both feet. Balance in this position _30__ seconds. Do with left foot in front of right. Repeat 2x with each foot forward.  Copyright  VHI. All rights reserved.  Heel Raise: Bilateral (Standing)    Stand at countertop for support if needed. Rise on balls of feet. Repeat _10___ times per set. Do _10___ sets per session. Do _1-2___ sessions per day.  http://orth.exer.us/39   Copyright  VHI. All rights reserved.

## 2019-09-19 NOTE — Telephone Encounter (Signed)
Otho Ket states pt wanst to do some AQ on Thursday to scheudle at 3pm and print new schedule.

## 2019-09-19 NOTE — Therapy (Signed)
[place], [name], [address] Phone: [phone]   Fax:  [phone]  Physical Therapy Treatment  Patient Details  Name: Pamela Stein MRN: [id number] Date of Birth: 01-02-1956 Referring Provider (PT): Phillips Odor   Encounter Date: 09/19/2019   PT End of Session - 09/19/19 1051    Visit Number 2    Number of Visits 8    Date for PT Re-Evaluation 10/13/19    Authorization Type BCBS Federal EMP PPO (50 VL, 34 remain)    Authorization - Visit Number 2    Authorization - Number of Visits 34    Progress Note Due on Visit 8    PT Start Time 1039   Patient arrived late   PT Stop Time 1121    PT Time Calculation (min) 42 min    Equipment Utilized During Treatment Gait belt    Activity Tolerance Patient tolerated treatment well;Patient limited by pain    Behavior During Therapy [facility] for tasks assessed/performed           Past Medical History:  Diagnosis Date  . Anxiety   . Back pain   . Bronchitis   . Complication of anesthesia    stopped breathing after endometrial ablation procedure prior to her being diagn. with OSA  . Diabetes mellitus   . Dizziness   . Heart murmur   . History of gout   . Hyperlipidemia   . Hypertension   . Neuropathy   . Obesity   . Obstructive sleep apnea    CPAP    Past Surgical History:  Procedure Laterality Date  . ABDOMINAL HYSTERECTOMY    . ADENOIDECTOMY    . CATARACT EXTRACTION Left    right 02/2018  . COLONOSCOPY  2008   diminutive rectal polyp, s/p removal. polypoid mucosa  . COLONOSCOPY WITH PROPOFOL N/A 01/03/2018   Procedure: COLONOSCOPY WITH PROPOFOL;  Surgeon: Daneil Dolin, MD;  [facility];  Service: Endoscopy;  Laterality: N/A;  12:00pm  . DILATION AND CURETTAGE OF UTERUS    . ENDOMETRIAL ABLATION    . EYE SURGERY Left 12/04/2013   cataract  . PARATHYROIDECTOMY N/A 12/22/2016   Procedure: PARATHYROIDECTOMY, NECK EXPLORATION;  Surgeon:  Jackolyn Confer, MD;  [facility];  Service: General;  Laterality: N/A;  . TONSILLECTOMY      There were no vitals filed for this visit.   Subjective Assessment - 09/19/19 1041    Subjective Patient reported that her knee is really bothering her today.    Limitations Standing;Lifting;Walking;House hold activities    Patient Stated Goals avoid falls    Currently in Pain? Yes    Pain Score 7     Pain Location Knee    Pain Orientation Left;Medial    Pain Descriptors / Indicators Aching    Pain Type Chronic pain    Pain Onset More than a month ago                             [facility] Adult PT Treatment/Exercise - 09/19/19 0001      Knee/Hip Exercises: Standing   Heel Raises 2 sets;10 reps;Both    Heel Raises Limitations UE assist    Knee Flexion 20 reps;Strengthening    Knee Flexion Limitations Balance focus No UE assist. Alternating LEs. 2-3 second hold.     Other Standing Knee Exercises STS elevated mat 2x10    Other Standing  Knee Exercises Tandem stance 2x30''       Knee/Hip Exercises: Supine   Bridges Strengthening;Both;10 reps;2 sets    Straight Leg Raises Strengthening;Right;Left;1 set;10 reps                    PT Short Term Goals - 09/19/19 1043      PT SHORT TERM GOAL #1   Title Patient will be independent with initial HEP and self-management strategies to improve functional outcomes    Time 2    Period Weeks    Status On-going    Target Date 09/29/19             PT Long Term Goals - 09/19/19 1043      PT LONG TERM GOAL #1   Title Patient will be able to perform stand x 5 in < 15 seconds to demonstrate improvement in functional mobility and reduced risk for falls.    Time 4    Period Weeks    Status On-going      PT LONG TERM GOAL #2   Title Patient will report at least 60% overall improvement in subjective complaint to indicate improvement in ability to perform ADLs.    Time 4    Period Weeks    Status On-going       PT LONG TERM GOAL #3   Title Patient will be able to ambulate at least 300 feet during 2MWT with LRAD to demonstrate improved ability to perform functional mobility and associated tasks.    Time 4    Period Weeks    Status On-going                 Plan - 09/19/19 1146    Clinical Impression Statement Focused on functional strengthening and balance this session. Reviewed patient's goals briefly and provided HEP with instruction for patient to perform all exercises which may challenge her balance at a countertop in case she requires upper extremity support. Patient required cueing with straight leg raises to maintain knee in neutral alignment. Also educated patient on aquatic therapy and patient consented to doing aquatic therapy.    Personal Factors and Comorbidities Comorbidity 1;Fitness;Past/Current Experience    Examination-Participation Restrictions Cleaning;Community Activity;Laundry;Shop    Stability/Clinical Decision Making Stable/Uncomplicated    Rehab Potential Good    PT Frequency 2x / week    PT Duration 4 weeks    PT Treatment/Interventions ADLs/Self Care Home Management;Therapeutic exercise;Therapeutic activities;Balance training;Neuromuscular re-education;Patient/family education;Manual techniques;Gait training;Stair training;Functional mobility training;Aquatic Therapy;Biofeedback;Cryotherapy;Electrical Stimulation;Iontophoresis 4mg /ml Dexamethasone;Moist Heat;Vasopneumatic Device;Taping;Splinting;Energy conservation;Orthotic Fit/Training;Dry needling;Passive range of motion;Spinal Manipulations;Joint Manipulations;Compression bandaging;Scar mobilization;Cognitive remediation;Ultrasound;Parrafin;Fluidtherapy;Contrast Bath;DME Instruction    PT Next Visit Plan Progress LE strength and balance as tolerated. Progress to standing strength and gait as able.    PT Home Exercise Plan 09/19/19: Tandem stance, heel raises    Consulted and Agree with Plan of Care Patient            Patient will benefit from skilled therapeutic intervention in order to improve the following deficits and impairments:  Abnormal gait, Decreased activity tolerance, Decreased balance, Decreased range of motion, Decreased strength, Difficulty walking, Pain, Decreased endurance, Decreased mobility  Visit Diagnosis: Other abnormalities of gait and mobility  Muscle weakness (generalized)  History of falling     Problem List Patient Active Problem List   Diagnosis Date Noted  . Polyneuropathy due to secondary diabetes mellitus ([place]) 05/25/2019  . Arthritis of right sacroiliac joint 05/25/2019  . Cervical disc disorder 05/25/2019  . Falls  05/25/2019  . General unsteadiness 05/25/2019  . Encounter for well woman exam with routine gynecological exam 01/09/2019  . Screening for colorectal cancer 01/09/2019  . Encounter for screening colonoscopy 12/13/2017  . Hyperparathyroidism, primary ([place]) 12/22/2016  . Vitamin D deficiency 10/31/2015  . Hyperuricemia 05/08/2013  . ONYCHOMYCOSIS, TOENAILS 02/22/2009  . NEVI, MULTIPLE 02/22/2009  . Diabetes mellitus ([place]) 01/25/2008  . BACK PAIN WITH RADICULOPATHY 11/09/2007  . Hyperlipidemia LDL goal <100 08/26/2007  . Morbid obesity ([place]) 08/26/2007  . Essential hypertension 08/26/2007  . Obstructive sleep apnea 04/20/2007   Clarene Critchley PT, DPT 11:48 AM, 09/19/19 [place] [address] [place], [name], [address] Phone: [phone]   Fax:  [phone]  Name: Pamela Stein MRN: [id number] Date of Birth: 1955-11-01

## 2019-09-19 NOTE — Telephone Encounter (Signed)
Called to inform patient of aquatic therapy and arriving to [facility] 20 minutes early as well as wearing swim attire. There was no answer, so left message and provided clinic phone number for any questions.  Clarene Critchley PT, DPT 12:33 PM, 09/19/19 [phone]

## 2019-09-21 ENCOUNTER — Other Ambulatory Visit: Payer: Self-pay

## 2019-09-21 ENCOUNTER — Encounter (HOSPITAL_COMMUNITY): Payer: Self-pay | Admitting: Physical Therapy

## 2019-09-21 ENCOUNTER — Ambulatory Visit (HOSPITAL_COMMUNITY): Payer: Federal, State, Local not specified - PPO | Admitting: Physical Therapy

## 2019-09-21 DIAGNOSIS — R2689 Other abnormalities of gait and mobility: Secondary | ICD-10-CM | POA: Diagnosis not present

## 2019-09-21 DIAGNOSIS — M6281 Muscle weakness (generalized): Secondary | ICD-10-CM | POA: Diagnosis not present

## 2019-09-21 DIAGNOSIS — Z9181 History of falling: Secondary | ICD-10-CM | POA: Diagnosis not present

## 2019-09-21 NOTE — Therapy (Signed)
[place], [name], [address] Phone: [phone]   Fax:  [phone]  Physical Therapy Treatment  Patient Details  Name: Pamela Stein MRN: [id number] Date of Birth: 1955-12-03 Referring Provider (PT): Phillips Odor   Encounter Date: 09/21/2019   PT End of Session - 09/21/19 1733    Visit Number 3    Number of Visits 8    Date for PT Re-Evaluation 10/13/19    Authorization Type BCBS Federal EMP PPO (50 VL, 34 remain)    Authorization - Visit Number 3    Authorization - Number of Visits 34    Progress Note Due on Visit 8    PT Start Time 1450    PT Stop Time 1540    PT Time Calculation (min) 50 min    Activity Tolerance Patient tolerated treatment well    Behavior During Therapy [facility] for tasks assessed/performed           Past Medical History:  Diagnosis Date  . Anxiety   . Back pain   . Bronchitis   . Complication of anesthesia    stopped breathing after endometrial ablation procedure prior to her being diagn. with OSA  . Diabetes mellitus   . Dizziness   . Heart murmur   . History of gout   . Hyperlipidemia   . Hypertension   . Neuropathy   . Obesity   . Obstructive sleep apnea    CPAP    Past Surgical History:  Procedure Laterality Date  . ABDOMINAL HYSTERECTOMY    . ADENOIDECTOMY    . CATARACT EXTRACTION Left    right 02/2018  . COLONOSCOPY  2008   diminutive rectal polyp, s/p removal. polypoid mucosa  . COLONOSCOPY WITH PROPOFOL N/A 01/03/2018   Procedure: COLONOSCOPY WITH PROPOFOL;  Surgeon: Daneil Dolin, MD;  [facility];  Service: Endoscopy;  Laterality: N/A;  12:00pm  . DILATION AND CURETTAGE OF UTERUS    . ENDOMETRIAL ABLATION    . EYE SURGERY Left 12/04/2013   cataract  . PARATHYROIDECTOMY N/A 12/22/2016   Procedure: PARATHYROIDECTOMY, NECK EXPLORATION;  Surgeon: Jackolyn Confer, MD;  [facility];  Service: General;  Laterality: N/A;  . TONSILLECTOMY       There were no vitals filed for this visit.   Subjective Assessment - 09/21/19 1727    Subjective Patient says she had a few stumbles at work the other day but no falls. Says her left knee is hurting today, about a "2".    Limitations Standing;Lifting;Walking;House hold activities    Patient Stated Goals avoid falls    Currently in Pain? Yes    Pain Score 2     Pain Location Knee    Pain Orientation Left    Pain Descriptors / Indicators Aching    Pain Type Chronic pain    Pain Onset More than a month ago                         Adult Aquatic Therapy - 09/21/19 1738      Treatment   Gait pool walking, sidestepping, retro walking 3RT each with pool DB for balance     Exercises heel raise 2 x 10, mini squat 2 x 10, standing hip abduction 2 x 10, tandem stance 2 x 30", single arm DB push down x20, double arm DB pushdown x 20  PT Short Term Goals - 09/19/19 1043      PT SHORT TERM GOAL #1   Title Patient will be independent with initial HEP and self-management strategies to improve functional outcomes    Time 2    Period Weeks    Status On-going    Target Date 09/29/19             PT Long Term Goals - 09/19/19 1043      PT LONG TERM GOAL #1   Title Patient will be able to perform stand x 5 in < 15 seconds to demonstrate improvement in functional mobility and reduced risk for falls.    Time 4    Period Weeks    Status On-going      PT LONG TERM GOAL #2   Title Patient will report at least 60% overall improvement in subjective complaint to indicate improvement in ability to perform ADLs.    Time 4    Period Weeks    Status On-going      PT LONG TERM GOAL #3   Title Patient will be able to ambulate at least 300 feet during 2MWT with LRAD to demonstrate improved ability to perform functional mobility and associated tasks.    Time 4    Period Weeks    Status On-going                 Plan - 09/21/19 1733     Clinical Impression Statement Patient tolerated session well today. Initiated aquatic therapy. Patient educated on benefits and application. Session focused on core and LE strengthening as well as static and dynamic balance. Patient was somewhat challenged with balance activity and required HHA and use of pool dumbbell floats for support. Patient cued on proper form and function of added exercise. Patient reports no pain during session today.    Personal Factors and Comorbidities Comorbidity 1;Fitness;Past/Current Experience    Examination-Participation Restrictions Cleaning;Community Activity;Laundry;Shop    Stability/Clinical Decision Making Stable/Uncomplicated    Rehab Potential Good    PT Frequency 2x / week    PT Duration 4 weeks    PT Treatment/Interventions ADLs/Self Care Home Management;Therapeutic exercise;Therapeutic activities;Balance training;Neuromuscular re-education;Patient/family education;Manual techniques;Gait training;Stair training;Functional mobility training;Aquatic Therapy;Biofeedback;Cryotherapy;Electrical Stimulation;Iontophoresis 4mg /ml Dexamethasone;Moist Heat;Vasopneumatic Device;Taping;Splinting;Energy conservation;Orthotic Fit/Training;Dry needling;Passive range of motion;Spinal Manipulations;Joint Manipulations;Compression bandaging;Scar mobilization;Cognitive remediation;Ultrasound;Parrafin;Fluidtherapy;Contrast Bath;DME Instruction    PT Next Visit Plan Progress LE strength and balance as tolerated. Progress to standing strength and gait as able.    PT Home Exercise Plan 09/19/19: Tandem stance, heel raises    Consulted and Agree with Plan of Care Patient           Patient will benefit from skilled therapeutic intervention in order to improve the following deficits and impairments:  Abnormal gait, Decreased activity tolerance, Decreased balance, Decreased range of motion, Decreased strength, Difficulty walking, Pain, Decreased endurance, Decreased mobility  Visit  Diagnosis: Other abnormalities of gait and mobility  Muscle weakness (generalized)     Problem List Patient Active Problem List   Diagnosis Date Noted  . Polyneuropathy due to secondary diabetes mellitus ([place]) 05/25/2019  . Arthritis of right sacroiliac joint 05/25/2019  . Cervical disc disorder 05/25/2019  . Falls 05/25/2019  . General unsteadiness 05/25/2019  . Encounter for well woman exam with routine gynecological exam 01/09/2019  . Screening for colorectal cancer 01/09/2019  . Encounter for screening colonoscopy 12/13/2017  . Hyperparathyroidism, primary ([place]) 12/22/2016  . Vitamin D deficiency 10/31/2015  . Hyperuricemia 05/08/2013  . ONYCHOMYCOSIS, TOENAILS 02/22/2009  .  NEVI, MULTIPLE 02/22/2009  . Diabetes mellitus ([place]) 01/25/2008  . BACK PAIN WITH RADICULOPATHY 11/09/2007  . Hyperlipidemia LDL goal <100 08/26/2007  . Morbid obesity ([place]) 08/26/2007  . Essential hypertension 08/26/2007  . Obstructive sleep apnea 04/20/2007    5:41 PM, 09/21/19 Josue Hector PT DPT  Physical Therapist with [facility]  [phone] [place] [address] [place], [name], [address] Phone: [phone]   Fax:  [phone]  Name: TIANNAH GREENLY MRN: [id number] Date of Birth: 03/19/1955

## 2019-09-28 ENCOUNTER — Ambulatory Visit (HOSPITAL_COMMUNITY): Payer: Federal, State, Local not specified - PPO | Admitting: Physical Therapy

## 2019-09-29 ENCOUNTER — Other Ambulatory Visit: Payer: Self-pay

## 2019-09-29 ENCOUNTER — Ambulatory Visit (HOSPITAL_COMMUNITY): Payer: Federal, State, Local not specified - PPO | Admitting: Physical Therapy

## 2019-09-29 DIAGNOSIS — M6281 Muscle weakness (generalized): Secondary | ICD-10-CM

## 2019-09-29 DIAGNOSIS — Z9181 History of falling: Secondary | ICD-10-CM

## 2019-09-29 DIAGNOSIS — R2689 Other abnormalities of gait and mobility: Secondary | ICD-10-CM

## 2019-09-29 NOTE — Therapy (Signed)
[place], [name], [address] Phone: [phone]   Fax:  [phone]  Physical Therapy Treatment  Patient Details  Name: Pamela Stein MRN: [id number] Date of Birth: 11-27-1955 Referring Provider (PT): Phillips Odor   Encounter Date: 09/29/2019   PT End of Session - 09/29/19 1026    Visit Number 4    Number of Visits 8    Date for PT Re-Evaluation 10/13/19    Authorization Type BCBS Federal EMP PPO (50 VL, 34 remain)    Authorization - Visit Number 4    Authorization - Number of Visits 34    Progress Note Due on Visit 8    PT Start Time 1010   PT late   PT Stop Time 1050    PT Time Calculation (min) 40 min    Activity Tolerance Patient tolerated treatment well    Behavior During Therapy [facility] for tasks assessed/performed           Past Medical History:  Diagnosis Date  . Anxiety   . Back pain   . Bronchitis   . Complication of anesthesia    stopped breathing after endometrial ablation procedure prior to her being diagn. with OSA  . Diabetes mellitus   . Dizziness   . Heart murmur   . History of gout   . Hyperlipidemia   . Hypertension   . Neuropathy   . Obesity   . Obstructive sleep apnea    CPAP    Past Surgical History:  Procedure Laterality Date  . ABDOMINAL HYSTERECTOMY    . ADENOIDECTOMY    . CATARACT EXTRACTION Left    right 02/2018  . COLONOSCOPY  2008   diminutive rectal polyp, s/p removal. polypoid mucosa  . COLONOSCOPY WITH PROPOFOL N/A 01/03/2018   Procedure: COLONOSCOPY WITH PROPOFOL;  Surgeon: Daneil Dolin, MD;  [facility];  Service: Endoscopy;  Laterality: N/A;  12:00pm  . DILATION AND CURETTAGE OF UTERUS    . ENDOMETRIAL ABLATION    . EYE SURGERY Left 12/04/2013   cataract  . PARATHYROIDECTOMY N/A 12/22/2016   Procedure: PARATHYROIDECTOMY, NECK EXPLORATION;  Surgeon: Jackolyn Confer, MD;  [facility];  Service: General;  Laterality: N/A;  .  TONSILLECTOMY      There were no vitals filed for this visit.   Subjective Assessment - 09/29/19 1012    Subjective Pt states that she is doing her exercises.  Her pain is not to bad today she has just gotten back from [place] visiting her sister.    Pertinent History Back pain, Lt knee pain, HTN    Limitations Standing;Lifting;Walking;House hold activities    How long can you sit comfortably? no problem    How long can you stand comfortably? 1-1.5 hours was 15 minutes    How long can you walk comfortably? 1 hour at grocery store was fatigued now she is not fatigued    Diagnostic tests IMPRESSION:1. Multilevel degenerative changes of the cervical spine withposterior disc protrusions at C3-4 through C5-6 causing flatteningof the anterior surface of the spinal cord and resulting in mildspinal canal stenosis at C3-4 and C4-5. No spinal cord signalabnormality.2. No high-grade neural foraminal narrowing.3. A 4 mm T1 hyperintense, T2 hypointense structure within thepituitary gland, just posterior to the pituitary stalk, likelyrepresenting a Rathke's cleft cyst.    Patient Stated Goals To walk better, stop falling and less knee pain, be safer going up and down steps;  all met  Currently in Pain? Yes    Pain Score 2     Pain Location Knee    Pain Orientation Left;Lateral    Pain Descriptors / Indicators Aching    Pain Type Chronic pain    Pain Onset More than a month ago    Pain Frequency Constant    Aggravating Factors  weight bear    Pain Relieving Factors rubbing    Effect of Pain on Daily Activities limits                             [facility] Adult PT Treatment/Exercise - 09/29/19 0001      Exercises   Exercises Knee/Hip      Knee/Hip Exercises: Standing   Heel Raises 15 reps    Knee Flexion Left;15 reps    Knee Flexion Limitations 4#     Lateral Step Up Both;Hand Hold: 0;Step Height: 4"    Forward Step Up Both;10 reps;Hand Hold: 0;Step Height: 4"    Functional  Squat 15 reps    Rocker Board 2 minutes    Rocker Board Limitations RT/LT     Other Standing Knee Exercises Tandem stance foam 2x30''       Knee/Hip Exercises: Seated   Long Arc Quad 10 reps    Long Arc Quad Weight 4 lbs.    Sit to [employer] 15 reps                    PT Short Term Goals - 09/19/19 1043      PT SHORT TERM GOAL #1   Title Patient will be independent with initial HEP and self-management strategies to improve functional outcomes    Time 2    Period Weeks    Status On-going    Target Date 09/29/19             PT Long Term Goals - 09/19/19 1043      PT LONG TERM GOAL #1   Title Patient will be able to perform stand x 5 in < 15 seconds to demonstrate improvement in functional mobility and reduced risk for falls.    Time 4    Period Weeks    Status On-going      PT LONG TERM GOAL #2   Title Patient will report at least 60% overall improvement in subjective complaint to indicate improvement in ability to perform ADLs.    Time 4    Period Weeks    Status On-going      PT LONG TERM GOAL #3   Title Patient will be able to ambulate at least 300 feet during 2MWT with LRAD to demonstrate improved ability to perform functional mobility and associated tasks.    Time 4    Period Weeks    Status On-going                 Plan - 09/29/19 1036    Clinical Impression Statement Progressed strengthening by adding squats and step ups with good form noted from pt.  Progressed balance by adding foam to tandem stance.  Pt voiced no increase of pain at end of session.    Personal Factors and Comorbidities Comorbidity 1;Fitness;Past/Current Experience    Examination-Participation Restrictions Cleaning;Community Activity;Laundry;Shop    Stability/Clinical Decision Making Stable/Uncomplicated    Rehab Potential Good    PT Frequency 2x / week    PT Duration 4 weeks    PT Treatment/Interventions ADLs/Self Care  Home Management;Therapeutic exercise;Therapeutic  activities;Balance training;Neuromuscular re-education;Patient/family education;Manual techniques;Gait training;Stair training;Functional mobility training;Aquatic Therapy;Biofeedback;Cryotherapy;Electrical Stimulation;Iontophoresis 23m/ml Dexamethasone;Moist Heat;Vasopneumatic Device;Taping;Splinting;Energy conservation;Orthotic Fit/Training;Dry needling;Passive range of motion;Spinal Manipulations;Joint Manipulations;Compression bandaging;Scar mobilization;Cognitive remediation;Ultrasound;Parrafin;Fluidtherapy;Contrast Bath;DME Instruction    PT Next Visit Plan Progress LE strength and balance as tolerated. Progress to standing strength and gait as able.    PT Home Exercise Plan 09/19/19: Tandem stance, heel raises;8?20  functional squat, sit to stand, step up    Consulted and Agree with Plan of Care Patient           Patient will benefit from skilled therapeutic intervention in order to improve the following deficits and impairments:  Abnormal gait, Decreased activity tolerance, Decreased balance, Decreased range of motion, Decreased strength, Difficulty walking, Pain, Decreased endurance, Decreased mobility  Visit Diagnosis: Other abnormalities of gait and mobility  Muscle weakness (generalized)  History of falling     Problem List Patient Active Problem List   Diagnosis Date Noted  . Polyneuropathy due to secondary diabetes mellitus ([place] 05/25/2019  . Arthritis of right sacroiliac joint 05/25/2019  . Cervical disc disorder 05/25/2019  . Falls 05/25/2019  . General unsteadiness 05/25/2019  . Encounter for well woman exam with routine gynecological exam 01/09/2019  . Screening for colorectal cancer 01/09/2019  . Encounter for screening colonoscopy 12/13/2017  . Hyperparathyroidism, primary ([place] 12/22/2016  . Vitamin D deficiency 10/31/2015  . Hyperuricemia 05/08/2013  . ONYCHOMYCOSIS, TOENAILS 02/22/2009  . NEVI, MULTIPLE 02/22/2009  . Diabetes mellitus ([place] 01/25/2008  .  BACK PAIN WITH RADICULOPATHY 11/09/2007  . Hyperlipidemia LDL goal <100 08/26/2007  . Morbid obesity ([place] 08/26/2007  . Essential hypertension 08/26/2007  . Obstructive sleep apnea 04/20/2007    CRayetta Humphrey PT CLT [phone]/20/2021, 10:49 AM  [place] [address]Phone: [phone]  Fax:  [phone] Name: Pamela SAXBYMRN: [id number] of Birth: [birth year]

## 2019-10-03 ENCOUNTER — Telehealth: Payer: Self-pay | Admitting: Adult Health

## 2019-10-03 ENCOUNTER — Ambulatory Visit: Payer: Federal, State, Local not specified - PPO | Admitting: Orthopaedic Surgery

## 2019-10-03 NOTE — Telephone Encounter (Signed)
Patient states she is losing armpit hair, and has gray hair on her face and would like to know if this in relation to a a medication. Please advise.

## 2019-10-03 NOTE — Telephone Encounter (Signed)
Telephoned patient at home number and advised symptoms were due to age. Patient voiced understanding.

## 2019-10-04 ENCOUNTER — Other Ambulatory Visit: Payer: Self-pay

## 2019-10-04 ENCOUNTER — Encounter (HOSPITAL_COMMUNITY): Payer: Self-pay

## 2019-10-04 ENCOUNTER — Ambulatory Visit (HOSPITAL_COMMUNITY): Payer: Federal, State, Local not specified - PPO

## 2019-10-04 DIAGNOSIS — R2689 Other abnormalities of gait and mobility: Secondary | ICD-10-CM | POA: Diagnosis not present

## 2019-10-04 DIAGNOSIS — M6281 Muscle weakness (generalized): Secondary | ICD-10-CM | POA: Diagnosis not present

## 2019-10-04 DIAGNOSIS — Z9181 History of falling: Secondary | ICD-10-CM

## 2019-10-04 NOTE — Therapy (Signed)
[facility] [address] [place], [name], [address] Phone: [phone]   Fax:  [phone]  Physical Therapy Treatment  Patient Details  Name: Pamela Stein MRN: [id number] Date of Birth: March 28, 1955 Referring Provider (PT): Luna Glasgow   Encounter Date: 10/04/2019   PT End of Session - 10/04/19 0840    Visit Number 5    Number of Visits 8    Date for PT Re-Evaluation 10/13/19    Authorization Type BCBS Federal EMP PPO (50 VL, 34 remain)    Authorization - Visit Number 5    Authorization - Number of Visits 34    Progress Note Due on Visit 8    PT Start Time [phone]    PT Stop Time 0913    PT Time Calculation (min) 40 min    Equipment Utilized During Treatment Gait belt    Activity Tolerance Patient tolerated treatment well    Behavior During Therapy WFL for tasks assessed/performed           Past Medical History:  Diagnosis Date   Anxiety    Back pain    Bronchitis    Complication of anesthesia    stopped breathing after endometrial ablation procedure prior to her being diagn. with OSA   Diabetes mellitus    Dizziness    Heart murmur    History of gout    Hyperlipidemia    Hypertension    Neuropathy    Obesity    Obstructive sleep apnea    CPAP    Past Surgical History:  Procedure Laterality Date   ABDOMINAL HYSTERECTOMY     ADENOIDECTOMY     CATARACT EXTRACTION Left    right 02/2018   COLONOSCOPY  2008   diminutive rectal polyp, s/p removal. polypoid mucosa   COLONOSCOPY WITH PROPOFOL N/A 01/03/2018   Procedure: COLONOSCOPY WITH PROPOFOL;  Surgeon: Daneil Dolin, MD;  [facility];  Service: Endoscopy;  Laterality: N/A;  12:00pm   DILATION AND CURETTAGE OF UTERUS     ENDOMETRIAL ABLATION     EYE SURGERY Left 12/04/2013   cataract   PARATHYROIDECTOMY N/A 12/22/2016   Procedure: PARATHYROIDECTOMY, NECK EXPLORATION;  Surgeon: Jackolyn Confer, MD;  [facility];  Service:  General;  Laterality: N/A;   TONSILLECTOMY      There were no vitals filed for this visit.   Subjective Assessment - 10/04/19 0836    Subjective Pt stated she is tired this morning, had to drive to [facility] this morning to pick up products.  Reports some pain Lt knee 1-2/10, burning pain.    Pertinent History Back pain, Lt knee pain, HTN    Patient Stated Goals To walk better, stop falling and less knee pain, be safer going up and down steps;  all met    Currently in Pain? Yes    Pain Score 2     Pain Location Knee    Pain Orientation Left    Pain Descriptors / Indicators Aching;Burning    Pain Type Chronic pain    Pain Onset More than a month ago    Pain Frequency Constant    Aggravating Factors  weight bearing    Pain Relieving Factors rubbing    Effect of Pain on Daily Activities limits              [facility] PT Assessment - 10/04/19 0001      Assessment   Medical Diagnosis History of falling     Referring  Provider (PT) Luna Glasgow    Onset Date/Surgical Date --   July 2021   Next MD Visit 10/10/19    Prior Therapy Yes for LT knee       Precautions   Precautions Fall                         [facility] Adult PT Treatment/Exercise - 10/04/19 0001      Exercises   Exercises Knee/Hip      Knee/Hip Exercises: Standing   Heel Raises 15 reps    Lateral Step Up Both;Hand Hold: 0;Step Height: 4"    Forward Step Up Both;15 reps;Step Height: 4"    Functional Squat 15 reps    Other Standing Knee Exercises sidestep RTB around thigh 2RT    Other Standing Knee Exercises tandem stance of foam 2x 30"      Knee/Hip Exercises: Seated   Long Arc Quad 15 reps    Long Arc Quad Weight 4 lbs.    Sit to Sand 15 reps;without UE support   eccentric control                   PT Short Term Goals - 09/19/19 1043      PT SHORT TERM GOAL #1   Title Patient will be independent with initial HEP and self-management strategies to improve functional outcomes    Time 2     Period Weeks    Status On-going    Target Date 09/29/19             PT Long Term Goals - 09/19/19 1043      PT LONG TERM GOAL #1   Title Patient will be able to perform stand x 5 in < 15 seconds to demonstrate improvement in functional mobility and reduced risk for falls.    Time 4    Period Weeks    Status On-going      PT LONG TERM GOAL #2   Title Patient will report at least 60% overall improvement in subjective complaint to indicate improvement in ability to perform ADLs.    Time 4    Period Weeks    Status On-going      PT LONG TERM GOAL #3   Title Patient will be able to ambulate at least 300 feet during 2MWT with LRAD to demonstrate improved ability to perform functional mobility and associated tasks.    Time 4    Period Weeks    Status On-going                 Plan - 10/04/19 0902    Clinical Impression Statement Progressed balance with sidestep with theraband resistance for gluteal stregthening and SLS activities.  Intermittent HHA required wiht tandem stance of dynamic surface.  No reports of pain through session.    Personal Factors and Comorbidities Comorbidity 1;Fitness;Past/Current Experience    Comorbidities see MRI    Examination-Activity Limitations Carry;Lift;Locomotion Level;Stairs;Stand    Examination-Participation Restrictions Cleaning;Community Activity;Laundry;Shop    Stability/Clinical Decision Making Stable/Uncomplicated    Clinical Decision Making Low    Rehab Potential Good    PT Frequency 2x / week    PT Duration 4 weeks    PT Treatment/Interventions ADLs/Self Care Home Management;Therapeutic exercise;Therapeutic activities;Balance training;Neuromuscular re-education;Patient/family education;Manual techniques;Gait training;Stair training;Functional mobility training;Aquatic Therapy;Biofeedback;Cryotherapy;Electrical Stimulation;Iontophoresis 67m/ml Dexamethasone;Moist Heat;Vasopneumatic Device;Taping;Splinting;Energy conservation;Orthotic  Fit/Training;Dry needling;Passive range of motion;Spinal Manipulations;Joint Manipulations;Compression bandaging;Scar mobilization;Cognitive remediation;Ultrasound;Parrafin;Fluidtherapy;Contrast Bath;DME Instruction    PT Next Visit Plan Increase step  up height next session.  Progress LE strength and balance as tolerated. Progress to standing strength and gait as able.    PT Home Exercise Plan 09/19/19: Tandem stance, heel raises;8?20  functional squat, sit to stand, step up           Patient will benefit from skilled therapeutic intervention in order to improve the following deficits and impairments:  Abnormal gait, Decreased activity tolerance, Decreased balance, Decreased range of motion, Decreased strength, Difficulty walking, Pain, Decreased endurance, Decreased mobility  Visit Diagnosis: Other abnormalities of gait and mobility  Muscle weakness (generalized)  History of falling     Problem List Patient Active Problem List   Diagnosis Date Noted   Polyneuropathy due to secondary diabetes mellitus ([place]) 05/25/2019   Arthritis of right sacroiliac joint 05/25/2019   Cervical disc disorder 05/25/2019   Falls 05/25/2019   General unsteadiness 05/25/2019   Encounter for well woman exam with routine gynecological exam 01/09/2019   Screening for colorectal cancer 01/09/2019   Encounter for screening colonoscopy 12/13/2017   Hyperparathyroidism, primary ([place]) 12/22/2016   Vitamin D deficiency 10/31/2015   Hyperuricemia 05/08/2013   ONYCHOMYCOSIS, TOENAILS 02/22/2009   NEVI, MULTIPLE 02/22/2009   Diabetes mellitus ([place]) 01/25/2008   BACK PAIN WITH RADICULOPATHY 11/09/2007   Hyperlipidemia LDL goal <100 08/26/2007   Morbid obesity ([place]) 08/26/2007   Essential hypertension 08/26/2007   Obstructive sleep apnea 04/20/2007   Ihor Austin, LPTA/CLT; CBIS [phone]  Aldona Lento 10/04/2019, 9:50 AM  [facility] [address] [place], [name], [address] Phone: [phone]   Fax:  [phone]  Name: Pamela Stein MRN: [id number] Date of Birth: 1955/02/25

## 2019-10-09 ENCOUNTER — Encounter (HOSPITAL_COMMUNITY): Payer: Federal, State, Local not specified - PPO | Admitting: Physical Therapy

## 2019-10-09 DIAGNOSIS — E119 Type 2 diabetes mellitus without complications: Secondary | ICD-10-CM | POA: Diagnosis not present

## 2019-10-10 ENCOUNTER — Encounter (HOSPITAL_COMMUNITY): Payer: Self-pay

## 2019-10-10 ENCOUNTER — Encounter: Payer: Self-pay | Admitting: Orthopaedic Surgery

## 2019-10-10 ENCOUNTER — Other Ambulatory Visit: Payer: Self-pay

## 2019-10-10 ENCOUNTER — Ambulatory Visit (INDEPENDENT_AMBULATORY_CARE_PROVIDER_SITE_OTHER): Payer: Federal, State, Local not specified - PPO | Admitting: Orthopaedic Surgery

## 2019-10-10 ENCOUNTER — Ambulatory Visit (HOSPITAL_COMMUNITY): Payer: Federal, State, Local not specified - PPO

## 2019-10-10 VITALS — BP 154/83 | HR 82 | Ht 62.5 in | Wt 221.0 lb

## 2019-10-10 DIAGNOSIS — Z9181 History of falling: Secondary | ICD-10-CM

## 2019-10-10 DIAGNOSIS — M6281 Muscle weakness (generalized): Secondary | ICD-10-CM | POA: Diagnosis not present

## 2019-10-10 DIAGNOSIS — R2689 Other abnormalities of gait and mobility: Secondary | ICD-10-CM

## 2019-10-10 DIAGNOSIS — M25562 Pain in left knee: Secondary | ICD-10-CM

## 2019-10-10 DIAGNOSIS — G8929 Other chronic pain: Secondary | ICD-10-CM | POA: Diagnosis not present

## 2019-10-10 NOTE — Progress Notes (Signed)
PROCEDURE NOTE:  The patient requests injections of the left knee , verbal consent was obtained.  The left knee was prepped appropriately after time out was performed.   Sterile technique was observed and injection of 1 cc of Depo-Medrol 40 mg with several cc's of plain xylocaine. Anesthesia was provided by ethyl chloride and a 20-gauge needle was used to inject the knee area. The injection was tolerated well.  A band aid dressing was applied.  The patient was advised to apply ice later today and tomorrow to the injection sight as needed.  She would like to discuss total knee.  She knows that the hospital is limiting surgery on total joints secondary to Covid 19.  She will see Dr. Aline Brochure to discuss this.  Electronically Signed Sanjuana Kava, MD 8/31/20219:07 AM

## 2019-10-10 NOTE — Therapy (Signed)
[place], [name], [address] Phone: [phone]   Fax:  [phone]  Physical Therapy Treatment  Patient Details  Name: Pamela Stein MRN: [id number] Date of Birth: September 11, 1955 Referring Provider (PT): Luna Glasgow   Encounter Date: 10/10/2019   PT End of Session - 10/10/19 1006    Visit Number 6    Number of Visits 8    Date for PT Re-Evaluation 10/13/19    Authorization Type BCBS Federal EMP PPO (50 VL, 34 remain)    Authorization - Visit Number 6    Authorization - Number of Visits 34    Progress Note Due on Visit 8    PT Start Time 1003    PT Stop Time 1042    PT Time Calculation (min) 39 min    Equipment Utilized During Treatment Gait belt    Activity Tolerance Patient tolerated treatment well    Behavior During Therapy WFL for tasks assessed/performed           Past Medical History:  Diagnosis Date  . Anxiety   . Back pain   . Bronchitis   . Complication of anesthesia    stopped breathing after endometrial ablation procedure prior to her being diagn. with OSA  . Diabetes mellitus   . Dizziness   . Heart murmur   . History of gout   . Hyperlipidemia   . Hypertension   . Neuropathy   . Obesity   . Obstructive sleep apnea    CPAP    Past Surgical History:  Procedure Laterality Date  . ABDOMINAL HYSTERECTOMY    . ADENOIDECTOMY    . CATARACT EXTRACTION Left    right 02/2018  . COLONOSCOPY  2008   diminutive rectal polyp, s/p removal. polypoid mucosa  . COLONOSCOPY WITH PROPOFOL N/A 01/03/2018   Procedure: COLONOSCOPY WITH PROPOFOL;  Surgeon: Daneil Dolin, MD;  [facility];  Service: Endoscopy;  Laterality: N/A;  12:00pm  . DILATION AND CURETTAGE OF UTERUS    . ENDOMETRIAL ABLATION    . EYE SURGERY Left 12/04/2013   cataract  . PARATHYROIDECTOMY N/A 12/22/2016   Procedure: PARATHYROIDECTOMY, NECK EXPLORATION;  Surgeon: Jackolyn Confer, MD;  [facility];  Service:  General;  Laterality: N/A;  . TONSILLECTOMY      There were no vitals filed for this visit.   Subjective Assessment - 10/10/19 1005    Subjective Reports he knee feels better following injection earlier today.    Patient Stated Goals To walk better, stop falling and less knee pain, be safer going up and down steps;  all met    Currently in Pain? No/denies                             [facility] Adult PT Treatment/Exercise - 10/10/19 0001      Exercises   Exercises Knee/Hip      Knee/Hip Exercises: Standing   Heel Raises 2 sets;10 reps    Heel Raises Limitations 2 sets 5" holds on incline slope    Forward Lunges 10 reps    Forward Lunges Limitations onto 4in step    Lateral Step Up Both;Hand Hold: 0;Step Height: 4"    Forward Step Up Both;10 reps;Hand Hold: 1;Step Height: 6"    Functional Squat 15 reps    SLS Rt 13", Lt 17" max of 3    Other Standing Knee Exercises sidestep RTB around thigh  2RT    Other Standing Knee Exercises tandem stance of foam 2x 30"      Knee/Hip Exercises: Seated   Sit to Sand 15 reps;without UE support   eccentric control                   PT Short Term Goals - 09/19/19 1043      PT SHORT TERM GOAL #1   Title Patient will be independent with initial HEP and self-management strategies to improve functional outcomes    Time 2    Period Weeks    Status On-going    Target Date 09/29/19             PT Long Term Goals - 09/19/19 1043      PT LONG TERM GOAL #1   Title Patient will be able to perform stand x 5 in < 15 seconds to demonstrate improvement in functional mobility and reduced risk for falls.    Time 4    Period Weeks    Status On-going      PT LONG TERM GOAL #2   Title Patient will report at least 60% overall improvement in subjective complaint to indicate improvement in ability to perform ADLs.    Time 4    Period Weeks    Status On-going      PT LONG TERM GOAL #3   Title Patient will be able to ambulate  at least 300 feet during 2MWT with LRAD to demonstrate improved ability to perform functional mobility and associated tasks.    Time 4    Period Weeks    Status On-going                 Plan - 10/10/19 1022    Clinical Impression Statement Session focus with LE strengthening and balance training.  Able to increase step up height with good slow control and no reports of knee pain through session.  Progressed to SLS with intermittnet HHA required for LOB episodes.    Personal Factors and Comorbidities Comorbidity 1;Fitness;Past/Current Experience    Comorbidities see MRI    Examination-Activity Limitations Carry;Lift;Locomotion Level;Stairs;Stand    Examination-Participation Restrictions Cleaning;Community Activity;Laundry;Shop    Stability/Clinical Decision Making Stable/Uncomplicated    Clinical Decision Making Low    Rehab Potential Good    PT Frequency 2x / week    PT Duration 4 weeks    PT Treatment/Interventions ADLs/Self Care Home Management;Therapeutic exercise;Therapeutic activities;Balance training;Neuromuscular re-education;Patient/family education;Manual techniques;Gait training;Stair training;Functional mobility training;Aquatic Therapy;Biofeedback;Cryotherapy;Electrical Stimulation;Iontophoresis 15m/ml Dexamethasone;Moist Heat;Vasopneumatic Device;Taping;Splinting;Energy conservation;Orthotic Fit/Training;Dry needling;Passive range of motion;Spinal Manipulations;Joint Manipulations;Compression bandaging;Scar mobilization;Cognitive remediation;Ultrasound;Parrafin;Fluidtherapy;Contrast Bath;DME Instruction    PT Next Visit Plan Add vector stance next session.  Progress LE strength and balance as tolerated. Progress to standing strength and gait as able.    PT Home Exercise Plan 09/19/19: Tandem stance, heel raises;8/20  functional squat, sit to stand, step up; 8/31: RTB sidestep           Patient will benefit from skilled therapeutic intervention in order to improve the  following deficits and impairments:  Abnormal gait, Decreased activity tolerance, Decreased balance, Decreased range of motion, Decreased strength, Difficulty walking, Pain, Decreased endurance, Decreased mobility  Visit Diagnosis: Muscle weakness (generalized)  Other abnormalities of gait and mobility  History of falling     Problem List Patient Active Problem List   Diagnosis Date Noted  . Polyneuropathy due to secondary diabetes mellitus ([place] 05/25/2019  . Arthritis of right sacroiliac joint 05/25/2019  . Cervical disc  disorder 05/25/2019  . Falls 05/25/2019  . General unsteadiness 05/25/2019  . Encounter for well woman exam with routine gynecological exam 01/09/2019  . Screening for colorectal cancer 01/09/2019  . Encounter for screening colonoscopy 12/13/2017  . Hyperparathyroidism, primary ([place]) 12/22/2016  . Vitamin D deficiency 10/31/2015  . Hyperuricemia 05/08/2013  . ONYCHOMYCOSIS, TOENAILS 02/22/2009  . NEVI, MULTIPLE 02/22/2009  . Diabetes mellitus ([place]) 01/25/2008  . BACK PAIN WITH RADICULOPATHY 11/09/2007  . Hyperlipidemia LDL goal <100 08/26/2007  . Morbid obesity ([place]) 08/26/2007  . Essential hypertension 08/26/2007  . Obstructive sleep apnea 04/20/2007   Ihor Austin, LPTA/CLT; CBIS [phone]  Aldona Lento 10/10/2019, 10:46 AM  [facility] [place], [name], [address] Phone: [phone]   Fax:  [phone]  Name: MAKAYLIA HEWETT MRN: [id number] Date of Birth: Aug 28, 1955

## 2019-10-10 NOTE — Patient Instructions (Signed)
Band Walk: Side Stepping    Tie band around legs, just above knees. Step 15 feet to one side, then step back to start. Repeat 15 feet per session. Note: Small towel between band and skin eases rubbing.  http://plyo.exer.us/76   Copyright  VHI. All rights reserved.

## 2019-10-11 ENCOUNTER — Encounter (HOSPITAL_COMMUNITY): Payer: Self-pay | Admitting: Physical Therapy

## 2019-10-11 ENCOUNTER — Ambulatory Visit (HOSPITAL_COMMUNITY): Payer: Federal, State, Local not specified - PPO | Attending: Neurology | Admitting: Physical Therapy

## 2019-10-11 DIAGNOSIS — R2689 Other abnormalities of gait and mobility: Secondary | ICD-10-CM

## 2019-10-11 DIAGNOSIS — Z9181 History of falling: Secondary | ICD-10-CM | POA: Insufficient documentation

## 2019-10-11 DIAGNOSIS — M6281 Muscle weakness (generalized): Secondary | ICD-10-CM | POA: Insufficient documentation

## 2019-10-11 NOTE — Therapy (Signed)
[facility] [address] [place], [name], [address] Phone: [phone]   Fax:  [phone]  Physical Therapy Treatment  Patient Details  Name: Pamela Stein MRN: [id number] Date of Birth: Aug 26, 1955 Referring Provider (PT): Luna Glasgow  Progress Note Reporting Period 09/13/19 to 10/11/19  See note below for Objective Data and Assessment of Progress/Goals.       Encounter Date: 10/11/2019   PT End of Session - 10/11/19 1003    Visit Number 7    Number of Visits 8    Date for PT Re-Evaluation 10/13/19    Authorization Type BCBS Federal EMP PPO (50 VL, 34 remain)    Authorization - Visit Number 7    Authorization - Number of Visits 34    Progress Note Due on Visit 8    PT Start Time 0955    PT Stop Time 1025    PT Time Calculation (min) 30 min    Equipment Utilized During Treatment --    Activity Tolerance Patient tolerated treatment well    Behavior During Therapy WFL for tasks assessed/performed           Past Medical History:  Diagnosis Date  . Anxiety   . Back pain   . Bronchitis   . Complication of anesthesia    stopped breathing after endometrial ablation procedure prior to her being diagn. with OSA  . Diabetes mellitus   . Dizziness   . Heart murmur   . History of gout   . Hyperlipidemia   . Hypertension   . Neuropathy   . Obesity   . Obstructive sleep apnea    CPAP    Past Surgical History:  Procedure Laterality Date  . ABDOMINAL HYSTERECTOMY    . ADENOIDECTOMY    . CATARACT EXTRACTION Left    right 02/2018  . COLONOSCOPY  2008   diminutive rectal polyp, s/p removal. polypoid mucosa  . COLONOSCOPY WITH PROPOFOL N/A 01/03/2018   Procedure: COLONOSCOPY WITH PROPOFOL;  Surgeon: Daneil Dolin, MD;  [facility];  Service: Endoscopy;  Laterality: N/A;  12:00pm  . DILATION AND CURETTAGE OF UTERUS    . ENDOMETRIAL ABLATION    . EYE SURGERY Left 12/04/2013   cataract  . PARATHYROIDECTOMY N/A  12/22/2016   Procedure: PARATHYROIDECTOMY, NECK EXPLORATION;  Surgeon: Jackolyn Confer, MD;  Location: [facility];  Service: General;  Laterality: N/A;  . TONSILLECTOMY      There were no vitals filed for this visit.   Subjective Assessment - 10/11/19 0958    Subjective Patient says she is feeling al little better after injections yesterday. Says her LT knee has not been popping. Reports no pain currently. Patient says she feels her balance has improved a lot since starting therapy. Says her knees still bother her when she stands for long periods, but feels that she has made about 80% improvement overall.    Patient Stated Goals To walk better, stop falling and less knee pain, be safer going up and down steps;  all met    Currently in Pain? No/denies              [facility] PT Assessment - 10/11/19 0001      Assessment   Medical Diagnosis History of falling     Referring Provider (PT) Luna Glasgow    Prior Therapy Yes for LT knee       Precautions   Precautions Fall      Restrictions   Weight Bearing  Restrictions No      Home Environment   Living Environment Private residence      Transfers   Five time sit to stand comments  14.4 sec with no UE    was 26.7 sec      Ambulation/Gait   Ambulation/Gait Yes    Ambulation/Gait Assistance 7: Independent    Ambulation Distance (Feet) 450 Feet    Assistive device None    Gait Pattern Within Functional Limits    Ambulation Surface Level;Indoor    Gait Comments 2MWT      Static Standing Balance   Static Standing Balance -  Activities  Single Leg Stance - Right Leg;Single Leg Stance - Left Leg    Static Standing - Comment/# of Minutes 16 sec min sway, 15 sec min sway                                  PT Education - 10/11/19 1204    Education Details on reassessment findings and transition to HEP for DC    Person(s) Educated Patient    Methods Explanation    Comprehension Verbalized understanding            PT  Short Term Goals - 10/11/19 1014      PT SHORT TERM GOAL #1   Title Patient will be independent with initial HEP and self-management strategies to improve functional outcomes    Baseline Reports compliance    Time 2    Period Weeks    Status Achieved    Target Date 09/29/19             PT Long Term Goals - 10/11/19 1015      PT LONG TERM GOAL #1   Title Patient will be able to perform stand x 5 in < 15 seconds to demonstrate improvement in functional mobility and reduced risk for falls.    Baseline 14.4 sec with no UEs    Time 4    Period Weeks    Status Achieved      PT LONG TERM GOAL #2   Title Patient will report at least 60% overall improvement in subjective complaint to indicate improvement in ability to perform ADLs.    Baseline Reports 80% improvement    Time 4    Period Weeks    Status Achieved      PT LONG TERM GOAL #3   Title Patient will be able to ambulate at least 300 feet during 2MWT with LRAD to demonstrate improved ability to perform functional mobility and associated tasks.    Baseline current 450 feet with no AD    Time 4    Period Weeks    Status Achieved                 Plan - 10/11/19 1028    Clinical Impression Statement Reassessment performed today. Patient demos good progress toward therapy goals and has currently met all goals. Discussed transition to HEP, walking program and water aerobics classes for DC. Educated patient on activity progressions to maintain current level of function. Patient had questions regarding pre-operative and post-operative therapy for knee since she is scheduled for ortho consult next week. Answered all questions. Discussed using 1 remaining visit for aquatic therapy exercise review tomorrow, to establish aquatic HEP, at which point patient will be DC from therapy.    Personal Factors and Comorbidities Comorbidity 1;Fitness;Past/Current Experience  Comorbidities see MRI    Examination-Activity Limitations  Carry;Lift;Locomotion Level;Stairs;Stand    Examination-Participation Restrictions Cleaning;Community Activity;Laundry;Shop    Stability/Clinical Decision Making Stable/Uncomplicated    Rehab Potential Good    PT Frequency 2x / week    PT Duration 4 weeks    PT Treatment/Interventions ADLs/Self Care Home Management;Therapeutic exercise;Therapeutic activities;Balance training;Neuromuscular re-education;Patient/family education;Manual techniques;Gait training;Stair training;Functional mobility training;Aquatic Therapy;Biofeedback;Cryotherapy;Electrical Stimulation;Iontophoresis 35m/ml Dexamethasone;Moist Heat;Vasopneumatic Device;Taping;Splinting;Energy conservation;Orthotic Fit/Training;Dry needling;Passive range of motion;Spinal Manipulations;Joint Manipulations;Compression bandaging;Scar mobilization;Cognitive remediation;Ultrasound;Parrafin;Fluidtherapy;Contrast Bath;DME Instruction    PT Next Visit Plan Establish and review aquatic HEP at remaining aquatic appointment then DC    PT Home Exercise Plan 09/19/19: Tandem stance, heel raises;8/20  functional squat, sit to stand, step up; 8/31: RTB sidestep    Consulted and Agree with Plan of Care Patient           Patient will benefit from skilled therapeutic intervention in order to improve the following deficits and impairments:  Abnormal gait, Decreased activity tolerance, Decreased balance, Decreased range of motion, Decreased strength, Difficulty walking, Pain, Decreased endurance, Decreased mobility  Visit Diagnosis: Muscle weakness (generalized)  Other abnormalities of gait and mobility  History of falling     Problem List Patient Active Problem List   Diagnosis Date Noted  . Polyneuropathy due to secondary diabetes mellitus ([place] 05/25/2019  . Arthritis of right sacroiliac joint 05/25/2019  . Cervical disc disorder 05/25/2019  . Falls 05/25/2019  . General unsteadiness 05/25/2019  . Encounter for well woman exam with routine  gynecological exam 01/09/2019  . Screening for colorectal cancer 01/09/2019  . Encounter for screening colonoscopy 12/13/2017  . Hyperparathyroidism, primary ([place] 12/22/2016  . Vitamin D deficiency 10/31/2015  . Hyperuricemia 05/08/2013  . ONYCHOMYCOSIS, TOENAILS 02/22/2009  . NEVI, MULTIPLE 02/22/2009  . Diabetes mellitus ([place] 01/25/2008  . BACK PAIN WITH RADICULOPATHY 11/09/2007  . Hyperlipidemia LDL goal <100 08/26/2007  . Morbid obesity ([place] 08/26/2007  . Essential hypertension 08/26/2007  . Obstructive sleep apnea 04/20/2007   12:06 PM, 10/11/19 CJosue HectorPT DPT  [facility] [phone] [id number] [address] [address]Phone: [phone]  Fax:  [phone] Name: SSHALAWN WYNDERMRN: [id number] of Birth: 8November 10, 1957

## 2019-10-12 ENCOUNTER — Encounter (HOSPITAL_COMMUNITY): Payer: Self-pay | Admitting: Physical Therapy

## 2019-10-12 ENCOUNTER — Ambulatory Visit (HOSPITAL_COMMUNITY): Payer: Federal, State, Local not specified - PPO | Admitting: Physical Therapy

## 2019-10-12 ENCOUNTER — Other Ambulatory Visit: Payer: Self-pay

## 2019-10-12 DIAGNOSIS — Z9181 History of falling: Secondary | ICD-10-CM | POA: Diagnosis not present

## 2019-10-12 DIAGNOSIS — R2689 Other abnormalities of gait and mobility: Secondary | ICD-10-CM | POA: Diagnosis not present

## 2019-10-12 DIAGNOSIS — M6281 Muscle weakness (generalized): Secondary | ICD-10-CM | POA: Diagnosis not present

## 2019-10-12 NOTE — Therapy (Signed)
[facility] [address] [place], [name], [address] Phone: [phone]   Fax:  [phone]  Physical Therapy Treatment  Patient Details  Name: Pamela Stein MRN: [id number] Date of Birth: 1955/11/23 Referring Provider (PT): Luna Glasgow  PHYSICAL THERAPY DISCHARGE SUMMARY  Visits from Start of Care: 8  Current functional level related to goals / functional outcomes: See below    Remaining deficits: See below    Education / Equipment: See below  Plan: Patient agrees to discharge.  Patient goals were met. Patient is being discharged due to meeting the stated rehab goals.  ?????       Encounter Date: 10/12/2019   PT End of Session - 10/12/19 1727    Visit Number 8    Number of Visits 8    Date for PT Re-Evaluation 10/13/19    Authorization Type BCBS Federal EMP PPO (50 VL, 34 remain)    Authorization - Visit Number 8    Authorization - Number of Visits 34    Progress Note Due on Visit 8    PT Start Time 1505    PT Stop Time 1545    PT Time Calculation (min) 40 min    Activity Tolerance Patient tolerated treatment well    Behavior During Therapy WFL for tasks assessed/performed           Past Medical History:  Diagnosis Date  . Anxiety   . Back pain   . Bronchitis   . Complication of anesthesia    stopped breathing after endometrial ablation procedure prior to her being diagn. with OSA  . Diabetes mellitus   . Dizziness   . Heart murmur   . History of gout   . Hyperlipidemia   . Hypertension   . Neuropathy   . Obesity   . Obstructive sleep apnea    CPAP    Past Surgical History:  Procedure Laterality Date  . ABDOMINAL HYSTERECTOMY    . ADENOIDECTOMY    . CATARACT EXTRACTION Left    right 02/2018  . COLONOSCOPY  2008   diminutive rectal polyp, s/p removal. polypoid mucosa  . COLONOSCOPY WITH PROPOFOL N/A 01/03/2018   Procedure: COLONOSCOPY WITH PROPOFOL;  Surgeon: Daneil Dolin, MD;  [facility];  Service: Endoscopy;  Laterality: N/A;  12:00pm  . DILATION AND CURETTAGE OF UTERUS    . ENDOMETRIAL ABLATION    . EYE SURGERY Left 12/04/2013   cataract  . PARATHYROIDECTOMY N/A 12/22/2016   Procedure: PARATHYROIDECTOMY, NECK EXPLORATION;  Surgeon: Jackolyn Confer, MD;  Location: [facility];  Service: General;  Laterality: N/A;  . TONSILLECTOMY      There were no vitals filed for this visit.   Subjective Assessment - 10/12/19 1724    Subjective Patient says she is doing well today and is having no pain currently. Says she has been doing much better now that knee is not hurting and is wanting to start back up walking again.    Patient Stated Goals To walk better, stop falling and less knee pain, be safer going up and down steps;  all met    Currently in Pain? No/denies                         Adult Aquatic Therapy - 10/12/19 1731      Treatment   Gait pool walking, high knee march, sidestepping, retro walking 3RT each with pool noodle     Exercises  heel raise 2 x 10, standing hip abduction 2 x 10, tandem stance 2 x 30", single arm DB push down x20, double arm DB pushdown x 20, standing hip extension 2 x 10 each, standing knee flexion 2 x 10 each                       PT Education - 10/12/19 1726    Education Details on aquatic HEP and transition to DC    Person(s) Educated Patient    Methods Explanation;Handout    Comprehension Verbalized understanding            PT Short Term Goals - 10/11/19 1014      PT SHORT TERM GOAL #1   Title Patient will be independent with initial HEP and self-management strategies to improve functional outcomes    Baseline Reports compliance    Time 2    Period Weeks    Status Achieved    Target Date 09/29/19             PT Long Term Goals - 10/11/19 1015      PT LONG TERM GOAL #1   Title Patient will be able to perform stand x 5 in < 15 seconds to demonstrate improvement in functional mobility and  reduced risk for falls.    Baseline 14.4 sec with no UEs    Time 4    Period Weeks    Status Achieved      PT LONG TERM GOAL #2   Title Patient will report at least 60% overall improvement in subjective complaint to indicate improvement in ability to perform ADLs.    Baseline Reports 80% improvement    Time 4    Period Weeks    Status Achieved      PT LONG TERM GOAL #3   Title Patient will be able to ambulate at least 300 feet during 2MWT with LRAD to demonstrate improved ability to perform functional mobility and associated tasks.    Baseline current 450 feet with no AD    Time 4    Period Weeks    Status Achieved                 Plan - 10/12/19 1727    Clinical Impression Statement Patient tolerated session well today. Reviewed aquatic ther ex and addressed all patient questions. Discussed plan with patient to initiate participation in water aerobics and walking program for continued strength and balance. Patient verbalized agreement with this plan. Patient educated on and issued aquatic HEP handout. Patient being DC today with all therapy goals met. Patient instructed to follow up with therapy services with any further questions or concerns.    Personal Factors and Comorbidities Comorbidity 1;Fitness;Past/Current Experience    Comorbidities see MRI    Examination-Activity Limitations Carry;Lift;Locomotion Level;Stairs;Stand    Examination-Participation Restrictions Cleaning;Community Activity;Laundry;Shop    Stability/Clinical Decision Making Stable/Uncomplicated    Rehab Potential Good    PT Frequency 2x / week    PT Duration 4 weeks    PT Treatment/Interventions ADLs/Self Care Home Management;Therapeutic exercise;Therapeutic activities;Balance training;Neuromuscular re-education;Patient/family education;Manual techniques;Gait training;Stair training;Functional mobility training;Aquatic Therapy;Biofeedback;Cryotherapy;Electrical Stimulation;Iontophoresis 4mg /ml  Dexamethasone;Moist Heat;Vasopneumatic Device;Taping;Splinting;Energy conservation;Orthotic Fit/Training;Dry needling;Passive range of motion;Spinal Manipulations;Joint Manipulations;Compression bandaging;Scar mobilization;Cognitive remediation;Ultrasound;Parrafin;Fluidtherapy;Contrast Bath;DME Instruction    PT Next Visit Plan DC to HEP    PT Home Exercise Plan 09/19/19: Tandem stance, heel raises;8/20  functional squat, sit to stand, step up; 8/31: RTB sidestep    Consulted and Agree with  Plan of Care Patient           Patient will benefit from skilled therapeutic intervention in order to improve the following deficits and impairments:  Abnormal gait, Decreased activity tolerance, Decreased balance, Decreased range of motion, Decreased strength, Difficulty walking, Pain, Decreased endurance, Decreased mobility  Visit Diagnosis: Muscle weakness (generalized)  History of falling  Other abnormalities of gait and mobility     Problem List Patient Active Problem List   Diagnosis Date Noted  . Polyneuropathy due to secondary diabetes mellitus ([place]) 05/25/2019  . Arthritis of right sacroiliac joint 05/25/2019  . Cervical disc disorder 05/25/2019  . Falls 05/25/2019  . General unsteadiness 05/25/2019  . Encounter for well woman exam with routine gynecological exam 01/09/2019  . Screening for colorectal cancer 01/09/2019  . Encounter for screening colonoscopy 12/13/2017  . Hyperparathyroidism, primary ([place]) 12/22/2016  . Vitamin D deficiency 10/31/2015  . Hyperuricemia 05/08/2013  . ONYCHOMYCOSIS, TOENAILS 02/22/2009  . NEVI, MULTIPLE 02/22/2009  . Diabetes mellitus ([place]) 01/25/2008  . BACK PAIN WITH RADICULOPATHY 11/09/2007  . Hyperlipidemia LDL goal <100 08/26/2007  . Morbid obesity ([place]) 08/26/2007  . Essential hypertension 08/26/2007  . Obstructive sleep apnea 04/20/2007    5:34 PM, 10/12/19 Josue Hector PT DPT  Physical Therapist with [facility]   [phone] [place] [address] [place], [name], [address] Phone: [phone]   Fax:  [phone]  Name: Pamela Stein MRN: [id number] Date of Birth: 16-Mar-1955

## 2019-10-17 ENCOUNTER — Ambulatory Visit (HOSPITAL_COMMUNITY): Payer: Federal, State, Local not specified - PPO

## 2019-10-19 ENCOUNTER — Encounter (HOSPITAL_COMMUNITY): Payer: Federal, State, Local not specified - PPO

## 2019-11-08 ENCOUNTER — Other Ambulatory Visit: Payer: Self-pay

## 2019-11-08 ENCOUNTER — Encounter: Payer: Self-pay | Admitting: Orthopedic Surgery

## 2019-11-08 ENCOUNTER — Ambulatory Visit (INDEPENDENT_AMBULATORY_CARE_PROVIDER_SITE_OTHER): Payer: Federal, State, Local not specified - PPO | Admitting: Orthopedic Surgery

## 2019-11-08 VITALS — BP 141/84 | HR 93 | Ht 62.5 in | Wt 222.0 lb

## 2019-11-08 DIAGNOSIS — G8929 Other chronic pain: Secondary | ICD-10-CM | POA: Diagnosis not present

## 2019-11-08 DIAGNOSIS — M1712 Unilateral primary osteoarthritis, left knee: Secondary | ICD-10-CM | POA: Diagnosis not present

## 2019-11-08 DIAGNOSIS — M1710 Unilateral primary osteoarthritis, unspecified knee: Secondary | ICD-10-CM

## 2019-11-08 DIAGNOSIS — M25562 Pain in left knee: Secondary | ICD-10-CM

## 2019-11-08 MED ORDER — MELOXICAM 7.5 MG PO TABS: 7.5000 mg | ORAL_TABLET | Freq: Every day | ORAL | 5 refills | Status: DC

## 2019-11-08 NOTE — Patient Instructions (Signed)
Keep weight down  Watch HgA1c  Start meloxicam

## 2019-11-08 NOTE — Progress Notes (Addendum)
NEW PROBLEM//OFFICE VISIT  Chief Complaint  Patient presents with  . Knee Pain    left knee surgical consult    Assessment and plan 64 year old female with diabetes usually has a good hemoglobin A1c, BMI is approaching forty patient has end-stage arthritis of the left knee MRI shows no meniscal tear but extensive cartilage loss on the medial compartment which corresponds with her pain.  Patient is a good surgical candidate as long as she keeps her hemoglobin see under control and her weight under control.  Patient will go on the list for inpatient surgery when available for left total knee  The procedure has been fully reviewed with the patient; The risks and benefits of surgery have been discussed and explained and understood. Alternative treatment has also been reviewed, questions were encouraged and answered. The postoperative plan is also been reviewed.   64 year old female presents for evaluation for left knee pain previously seen by Dr. Luna Glasgow treated with physical therapy and cortisone injection complains of severe left knee pain and difficulty with certain activities of daily living including but not limited to stair climbing getting out of a chair or getting in and out of a car.  She has decreased range of motion giving way of the left knee.  Review of systems Review of Systems  Constitutional: Negative for chills and fever.  Respiratory: Positive for shortness of breath.   Cardiovascular: Negative for chest pain.  All other systems reviewed and are negative.    Past Medical History:  Diagnosis Date  . Anxiety   . Back pain   . Bronchitis   . Complication of anesthesia    stopped breathing after endometrial ablation procedure prior to her being diagn. with OSA  . Diabetes mellitus   . Dizziness   . Heart murmur   . History of gout   . Hyperlipidemia   . Hypertension   . Neuropathy   . Obesity   . Obstructive sleep apnea    CPAP    Past Surgical History:  Procedure  Laterality Date  . ABDOMINAL HYSTERECTOMY    . ADENOIDECTOMY    . CATARACT EXTRACTION Left    right 02/2018  . COLONOSCOPY  2008   diminutive rectal polyp, s/p removal. polypoid mucosa  . COLONOSCOPY WITH PROPOFOL N/A 01/03/2018   Procedure: COLONOSCOPY WITH PROPOFOL;  Surgeon: Daneil Dolin, MD;  [facility];  Service: Endoscopy;  Laterality: N/A;  12:00pm  . DILATION AND CURETTAGE OF UTERUS    . ENDOMETRIAL ABLATION    . EYE SURGERY Left 12/04/2013   cataract  . PARATHYROIDECTOMY N/A 12/22/2016   Procedure: PARATHYROIDECTOMY, NECK EXPLORATION;  Surgeon: Jackolyn Confer, MD;  Location[facility];  Service: General;  Laterality: N/A;  . TONSILLECTOMY      Family History  Problem Relation Age of Onset  . Cancer Father        throat  . Throat cancer Father   . Hypertension Father   . Diabetes Father   . Diabetes Maternal Grandmother   . Hypertension Maternal Grandfather   . Hypertension Paternal Grandmother   . Diabetes Paternal Grandfather   . Hypertension Mother   . Diabetes Mother   . Cancer Paternal Aunt   . Colon cancer Neg Hx   . Colon polyps Neg Hx    Social History   Tobacco Use  . Smoking status: Never Smoker  . Smokeless tobacco: Never Used  Vaping Use  . Vaping Use: Never used  Substance Use Topics  .  Alcohol use: No    Alcohol/week: 0.0 standard drinks  . Drug use: No    Allergies  Allergen Reactions  . Ace Inhibitors Cough    Current Meds  Medication Sig  . albuterol (PROVENTIL HFA;VENTOLIN HFA) 108 (90 Base) MCG/ACT inhaler Inhale 1-2 puffs into the lungs every 6 (six) hours as needed for wheezing or shortness of breath.  Marland Kitchen amLODipine (NORVASC) 10 MG tablet TAKE 1 TABLET BY MOUTH DAILY (Patient taking differently: Take 10 mg by mouth daily. )  . aspirin EC 81 MG tablet Take 81 mg by mouth daily.  . Cholecalciferol 1.25 MG (50000 UT) capsule cholecalciferol (vitamin D3) 1,250 mcg (50,000 unit) capsule  TAKE 1 CAPSULE BY MOUTH EVERY 4  WEEKS  . DULoxetine (CYMBALTA) 60 MG capsule Take 1 capsule every day by oral route.  Marland Kitchen escitalopram (LEXAPRO) 10 MG tablet Take 10 mg by mouth daily.  . furosemide (LASIX) 20 MG tablet Take 20 mg by mouth daily.   Marland Kitchen gabapentin (NEURONTIN) 300 MG capsule Take 1 capsule (300 mg total) by mouth 3 (three) times daily. (Patient taking differently: Take 300 mg by mouth daily. )  . losartan (COZAAR) 25 MG tablet Take 25 mg by mouth daily.   . metFORMIN (GLUCOPHAGE) 500 MG tablet TAKE 1 TABLET BY MOUTH TWICE DAILY WITH A MEAL (Patient taking differently: Take 500 mg by mouth daily with lunch. )  . pravastatin (PRAVACHOL) 80 MG tablet TAKE 1 TABLET(80 MG) BY MOUTH EVERY EVENING (Patient taking differently: Take 80 mg by mouth every evening. )  . spironolactone (ALDACTONE) 25 MG tablet Take 25 mg by mouth daily.     BP (!) 141/84   Pulse 93   Ht 5' 2.5" (1.588 m)   Wt 222 lb (100.7 kg)   BMI 39.96 kg/m   Physical Exam Constitutional:      General: She is not in acute distress.    Appearance: She is well-developed.  Cardiovascular:     Comments: No peripheral edema Skin:    General: Skin is warm and dry.  Neurological:     Mental Status: She is alert and oriented to person, place, and time.     Sensory: No sensory deficit.     Coordination: Coordination normal.     Gait: Gait normal.     Deep Tendon Reflexes: Reflexes are normal and symmetric.     Ortho Exam  Left knee Tenderness medial compartment small effusion Knee flexion arc 115 degrees ACL PCL stable Strength and stability normal laterally Motor exam normal    MEDICAL DECISION MAKING  A.  Encounter Diagnoses  Name Primary?  . Chronic pain of left knee Yes  . Primary localized osteoarthritis of knee     B. DATA ANALYSED:   IMAGING: Interpretation of images: I looked at the images of her knee she has a varus knee medial joint space narrowing MRI also shows cartilage loss medially    C. MANAGEMENT   Recommend  bracing and NSAID therapy until surgery can be done patient will keep her weight under control monitor the A1c levels as well  Meds ordered this encounter  Medications  . meloxicam (MOBIC) 7.5 MG tablet    Sig: Take 1 tablet (7.5 mg total) by mouth daily.    Dispense:  30 tablet    Refill:  5     Meds ordered this encounter  Medications  . meloxicam (MOBIC) 7.5 MG tablet    Sig: Take 1 tablet (7.5 mg total) by mouth  daily.    Dispense:  30 tablet    Refill:  5      Arther Abbott, MD  11/08/2019 11:10 AM

## 2019-12-01 ENCOUNTER — Telehealth: Payer: Self-pay | Admitting: Orthopaedic Surgery

## 2019-12-01 NOTE — Telephone Encounter (Signed)
Patient called states her knee is burning really bad and is requesting something for it.  If you could please advise.

## 2019-12-04 ENCOUNTER — Telehealth: Payer: Self-pay | Admitting: Orthopedic Surgery

## 2019-12-04 NOTE — Telephone Encounter (Signed)
Have her come in earlier, make appointment.

## 2019-12-04 NOTE — Telephone Encounter (Signed)
Patient called and wants to know when will she have her surgery.  Please call her at [phone].

## 2019-12-05 ENCOUNTER — Encounter: Payer: Self-pay | Admitting: Orthopaedic Surgery

## 2019-12-05 ENCOUNTER — Other Ambulatory Visit: Payer: Self-pay

## 2019-12-05 ENCOUNTER — Ambulatory Visit (INDEPENDENT_AMBULATORY_CARE_PROVIDER_SITE_OTHER): Payer: Federal, State, Local not specified - PPO | Admitting: Orthopaedic Surgery

## 2019-12-05 ENCOUNTER — Ambulatory Visit: Payer: Federal, State, Local not specified - PPO | Admitting: Orthopaedic Surgery

## 2019-12-05 VITALS — BP 161/83 | HR 81 | Ht 62.5 in | Wt 225.0 lb

## 2019-12-05 DIAGNOSIS — G8929 Other chronic pain: Secondary | ICD-10-CM | POA: Diagnosis not present

## 2019-12-05 DIAGNOSIS — M25562 Pain in left knee: Secondary | ICD-10-CM

## 2019-12-05 NOTE — Progress Notes (Signed)
She is waiting to get total knee on the left.  Her knee is more painful.  PROCEDURE NOTE:  The patient requests injections of the left knee , verbal consent was obtained.  The left knee was prepped appropriately after time out was performed.   Sterile technique was observed and injection of 1 cc of Depo-Medrol 40 mg with several cc's of plain xylocaine. Anesthesia was provided by ethyl chloride and a 20-gauge needle was used to inject the knee area. The injection was tolerated well.  A band aid dressing was applied.  The patient was advised to apply ice later today and tomorrow to the injection sight as needed.  I will see as needed.  She awaits total knee by Dr. Aline Brochure.  Electronically Signed Sanjuana Kava, MD 10/26/20219:05 AM

## 2019-12-08 DIAGNOSIS — Z23 Encounter for immunization: Secondary | ICD-10-CM | POA: Diagnosis not present

## 2019-12-08 DIAGNOSIS — E119 Type 2 diabetes mellitus without complications: Secondary | ICD-10-CM | POA: Diagnosis not present

## 2019-12-08 DIAGNOSIS — Z0001 Encounter for general adult medical examination with abnormal findings: Secondary | ICD-10-CM | POA: Diagnosis not present

## 2019-12-08 DIAGNOSIS — J410 Simple chronic bronchitis: Secondary | ICD-10-CM | POA: Diagnosis not present

## 2019-12-08 DIAGNOSIS — I10 Essential (primary) hypertension: Secondary | ICD-10-CM | POA: Diagnosis not present

## 2020-01-19 ENCOUNTER — Other Ambulatory Visit: Payer: Self-pay | Admitting: Orthopedic Surgery

## 2020-02-14 ENCOUNTER — Telehealth: Payer: Self-pay | Admitting: Orthopedic Surgery

## 2020-02-14 DIAGNOSIS — J410 Simple chronic bronchitis: Secondary | ICD-10-CM | POA: Diagnosis not present

## 2020-02-14 DIAGNOSIS — E119 Type 2 diabetes mellitus without complications: Secondary | ICD-10-CM | POA: Diagnosis not present

## 2020-02-14 DIAGNOSIS — Z6841 Body Mass Index (BMI) 40.0 and over, adult: Secondary | ICD-10-CM | POA: Diagnosis not present

## 2020-02-14 DIAGNOSIS — I10 Essential (primary) hypertension: Secondary | ICD-10-CM | POA: Diagnosis not present

## 2020-02-14 NOTE — Telephone Encounter (Signed)
Patient has called to inquire about whether she may possibly have gel injections in her knee rather than surgery at this time?

## 2020-02-14 NOTE — Telephone Encounter (Signed)
Called back to patient to notify. States the more she thinks about it, she may just leave things as is, with the surgery, as she has already notified her work. States her insurance card is the same.  Routing to Amy to further discuss with patient upon her return to clinic.

## 2020-02-14 NOTE — Telephone Encounter (Signed)
Yes   Amy will do a pre cert check

## 2020-02-16 NOTE — Telephone Encounter (Signed)
She has now decided it would be best to proceed with the surgery, she has indicated she is ready to get it done with  Advised her she will go home same day, she has a neighbor set up to help her when she goes home, to you [facility]

## 2020-02-16 NOTE — Telephone Encounter (Signed)
She does not meet same day surgery criteria   CPAP DM HTN Obesity

## 2020-02-19 NOTE — Telephone Encounter (Signed)
I called her we discussed  Pamela Stein when you are in the office will you see if we can do some visco for her series of 3 since surgery postponed  Pamela Stein, she has forms, can we see about holding them and not charging since we had to cancel the surgery

## 2020-02-20 NOTE — Telephone Encounter (Signed)
Yes, discussing with forms provider Ciox and with patient regarding holding forms for now.

## 2020-02-21 DIAGNOSIS — R296 Repeated falls: Secondary | ICD-10-CM | POA: Diagnosis not present

## 2020-02-21 DIAGNOSIS — G629 Polyneuropathy, unspecified: Secondary | ICD-10-CM | POA: Diagnosis not present

## 2020-02-21 DIAGNOSIS — E1142 Type 2 diabetes mellitus with diabetic polyneuropathy: Secondary | ICD-10-CM | POA: Diagnosis not present

## 2020-02-21 DIAGNOSIS — G4733 Obstructive sleep apnea (adult) (pediatric): Secondary | ICD-10-CM | POA: Diagnosis not present

## 2020-02-21 NOTE — Telephone Encounter (Signed)
Called back to patient; left message.(No forms yet received here; okay for patient to hold until needed)

## 2020-02-22 NOTE — Telephone Encounter (Signed)
Submitted online [id number] for Supartz injections x 3.  No PA started yet.  Will f/u on VOB first.

## 2020-02-22 NOTE — Telephone Encounter (Signed)
VOB attached- Pamela Stein $40 copay [id number] covered at 100% Supartz covered at 70%  No PA needed  Will you please call patient and advise on the above?  She will owe the $40 copay each visit and 30% costs of Supartz.  I do not know a more exact amount she will owe, as I do not know her insurance's allowable for Supartz.   Please let me know if she wants to proceed and I can order the medication.    I will email you the verification of benefits, if you will please print it and send to scan into epic.    Thanks.

## 2020-02-27 ENCOUNTER — Other Ambulatory Visit (HOSPITAL_COMMUNITY): Payer: Federal, State, Local not specified - PPO

## 2020-02-27 NOTE — Telephone Encounter (Signed)
We cannot do a payment plan, however once she gets her bill from [facility], and if she cannot pay all, they will send to pre collections and then she can setup a payment plan.

## 2020-02-27 NOTE — Telephone Encounter (Signed)
I called her to advise.  Gave her estimate, and she is asking if we can work out a Agricultural consultant for her

## 2020-02-28 NOTE — Telephone Encounter (Signed)
I called her to advise / 

## 2020-03-04 ENCOUNTER — Other Ambulatory Visit: Payer: Self-pay

## 2020-03-04 ENCOUNTER — Encounter (HOSPITAL_COMMUNITY): Payer: Self-pay | Admitting: Physical Therapy

## 2020-03-04 ENCOUNTER — Other Ambulatory Visit (HOSPITAL_COMMUNITY): Payer: Federal, State, Local not specified - PPO

## 2020-03-04 ENCOUNTER — Ambulatory Visit (HOSPITAL_COMMUNITY): Payer: Federal, State, Local not specified - PPO | Attending: Neurology | Admitting: Physical Therapy

## 2020-03-04 DIAGNOSIS — M6281 Muscle weakness (generalized): Secondary | ICD-10-CM

## 2020-03-04 DIAGNOSIS — R2689 Other abnormalities of gait and mobility: Secondary | ICD-10-CM | POA: Diagnosis not present

## 2020-03-04 DIAGNOSIS — Z9181 History of falling: Secondary | ICD-10-CM | POA: Diagnosis not present

## 2020-03-04 NOTE — Therapy (Signed)
[place], [name], [address] Phone: [phone]   Fax:  [phone]  Physical Therapy Evaluation  Patient Details  Name: Pamela Stein MRN: [id number] Date of Birth: Apr 26, 1955 Referring Provider (PT): Phillips Odor MD   Encounter Date: 03/04/2020   PT End of Session - 03/04/20 1009    Visit Number 1    Number of Visits 8    Date for PT Re-Evaluation 04/01/20    Authorization Type BCBS (no auth, VL 50- 0 used)    Authorization - Visit Number 1    Authorization - Number of Visits 50    Progress Note Due on Visit 10    PT Start Time 1010   arrives late   PT Stop Time 1042    PT Time Calculation (min) 32 min    Activity Tolerance Patient tolerated treatment well    Behavior During Therapy [facility] for tasks assessed/performed           Past Medical History:  Diagnosis Date  . Anxiety   . Back pain   . Bronchitis   . Complication of anesthesia    stopped breathing after endometrial ablation procedure prior to her being diagn. with OSA  . Diabetes mellitus   . Dizziness   . Heart murmur   . History of gout   . Hyperlipidemia   . Hypertension   . Neuropathy   . Obesity   . Obstructive sleep apnea    CPAP    Past Surgical History:  Procedure Laterality Date  . ABDOMINAL HYSTERECTOMY    . ADENOIDECTOMY    . CATARACT EXTRACTION Left    right 02/2018  . COLONOSCOPY  2008   diminutive rectal polyp, s/p removal. polypoid mucosa  . COLONOSCOPY WITH PROPOFOL N/A 01/03/2018   Procedure: COLONOSCOPY WITH PROPOFOL;  Surgeon: Daneil Dolin, MD;  [facility];  Service: Endoscopy;  Laterality: N/A;  12:00pm  . DILATION AND CURETTAGE OF UTERUS    . ENDOMETRIAL ABLATION    . EYE SURGERY Left 12/04/2013   cataract  . PARATHYROIDECTOMY N/A 12/22/2016   Procedure: PARATHYROIDECTOMY, NECK EXPLORATION;  Surgeon: Jackolyn Confer, MD;  Location: [facility];  Service: General;  Laterality: N/A;  .  TONSILLECTOMY      There were no vitals filed for this visit.    Subjective Assessment - 03/04/20 1010    Subjective Patient is a 65 y.o. female who presents to physical therapy with referral for repeated falls. Patient states she was supposed to have her knee replaced but they canceled it for now. She will now get injections in her knee over the next few weeks. They throughout that doing some therapy along with shots would be good for her. She does a few of her old exercises. She has had about 4-5 falls since she was here last. She loses her balance with turning. Her main goal is to stabilize her knee some.    Limitations Walking;Standing;House hold activities    How long can you walk comfortably? 15 minutes    Patient Stated Goals stabilize her knee some    Currently in Pain? Yes    Pain Score 5     Pain Location Knee    Pain Orientation Left    Pain Descriptors / Indicators Dull    Pain Type Chronic pain    Pain Onset More than a month ago    Pain Frequency Constant  [facility] PT Assessment - 03/04/20 0001      Assessment   Medical Diagnosis Repeated Falls    Referring Provider (PT) Phillips Odor MD    Onset Date/Surgical Date 10/24/18    Next MD Visit few months    Prior Therapy yes      Precautions   Precautions Fall      Restrictions   Weight Bearing Restrictions No      Balance Screen   Has the patient fallen in the past 6 months Yes    How many times? 4-5    Has the patient had a decrease in activity level because of a fear of falling?  Yes    Is the patient reluctant to leave their home because of a fear of falling?  Yes      Prior Function   Level of Independence Independent    Vocation Full time employment      Cognition   Overall Cognitive Status Within Functional Limits for tasks assessed      Observation/Other Assessments   Observations Ambulates with SPC, knee brace on L knee    Focus on Therapeutic Outcomes (FOTO)  n/a      ROM /  Strength   AROM / PROM / Strength Strength      Strength   Strength Assessment Site Hip;Knee;Ankle    Right/Left Hip Right;Left    Right Hip Flexion 4+/5    Left Hip Flexion 4/5    Right/Left Knee Right;Left    Right Knee Flexion 5/5    Right Knee Extension 5/5    Left Knee Flexion 4+/5    Left Knee Extension 4+/5    Right/Left Ankle Right;Left    Right Ankle Dorsiflexion 5/5    Left Ankle Dorsiflexion 5/5      Transfers   Five time sit to stand comments  18.01 seconds    Comments without UE use      Ambulation/Gait   Ambulation/Gait Yes    Ambulation Distance (Feet) 305 Feet    Assistive device None    Gait Pattern Antalgic;Left flexed knee in stance;Decreased hip/knee flexion - left    Ambulation Surface Level;Indoor    Gait velocity decreased    Gait Comments 2MWT, unteady, labored without AD                      Objective measurements completed on examination: See above findings.               PT Education - 03/04/20 1008    Education Details Patient educated on exam findings, POC, scope of PT, purpose of PT, transitioning to self management, performing old HEP again    Person(s) Educated Patient    Methods Explanation;Demonstration    Comprehension Verbalized understanding;Returned demonstration            PT Short Term Goals - 03/04/20 1140      PT SHORT TERM GOAL #1   Title Patient will be independent with HEP in order to improve functional outcomes.    Time 2    Period Weeks    Status New    Target Date 03/18/20      PT SHORT TERM GOAL #2   Title Pt to be able to single leg stance for 25" on both LE to reduce risk of falling    Time 2    Period Weeks    Status New    Target Date 03/18/20      PT  SHORT TERM GOAL #3   Title Patient will report at least 25% improvement in symptoms for improved quality of life.    Time 2    Period Weeks    Status New    Target Date 03/18/20             PT Long Term Goals - 03/04/20  1141      PT LONG TERM GOAL #1   Title Patient will be able to perform stand x 5 in < 15 seconds to demonstrate improvement in functional mobility and reduced risk for falls.    Time 4    Period Weeks    Status New    Target Date 04/01/20      PT LONG TERM GOAL #2   Title Patient will report at least 75% improvement in symptoms for improved quality of life.    Time 4    Period Weeks    Status New    Target Date 04/01/20      PT LONG TERM GOAL #3   Title Patient will be able to ambulate at least 400 feet in 2MWT in order to demonstrate improved gait speed for community ambulation.    Time 4    Period Weeks    Status New    Target Date 04/01/20                  Plan - 03/04/20 1133    Clinical Impression Statement Patient is a 65 y.o. female who presents to physical therapy with referral for repeated falls. Patient remains limited by knee pain and impaired dynamic balance. She presents with pain limited deficits in L knee strength, ROM, endurance, gait, balance, transfers, and functional mobility with ADL. She is having to modify and restrict ADL as indicated by subjective information and objective measures which is affecting overall participation. Discussed with patient the importance of completing HEP for improving/maintaining mobility as patient has attended PT last year for similar issue. Patient will benefit from skilled physical therapy in order to improve function and reduce impairment.    Personal Factors and Comorbidities Age;Fitness;Behavior Pattern;Past/Current Experience;Comorbidity 3+;Time since onset of injury/illness/exacerbation    Comorbidities anxiety, DM, HTN    Examination-Activity Limitations Locomotion Level;Transfers;Squat;Stairs;Stand;Lift;Bend    Examination-Participation Restrictions Cleaning;Occupation;Meal Prep;Church;Community Activity;Shop;Volunteer;Yard Work    Merchant navy officer Stable/Uncomplicated    Designer, jewellery Low     Rehab Potential Fair    PT Frequency 2x / week    PT Duration 4 weeks    PT Treatment/Interventions ADLs/Self Care Home Management;Aquatic Therapy;Cryotherapy;Electrical Stimulation;Iontophoresis 4mg /ml Dexamethasone;Moist Heat;Traction;Ultrasound;DME Instruction;Gait training;Stair training;Functional mobility training;Therapeutic activities;Therapeutic exercise;Balance training;Neuromuscular re-education;Patient/family education;Manual techniques;Manual lymph drainage;Compression bandaging;Dry needling;Energy conservation;Spinal Manipulations;Joint Manipulations;Splinting;Taping    PT Next Visit Plan begin L knee strengthening and balance training, review prior HEP and give updated handouts if needed, work to transition to [employer], give [employer] before d/c to transition to self management    Consulted and Agree with Plan of Care Patient           Patient will benefit from skilled therapeutic intervention in order to improve the following deficits and impairments:  Abnormal gait,Decreased endurance,Decreased activity tolerance,Pain,Decreased balance,Impaired flexibility,Improper body mechanics,Decreased strength,Decreased mobility  Visit Diagnosis: Muscle weakness (generalized)  History of falling  Other abnormalities of gait and mobility     Problem List Patient Active Problem List   Diagnosis Date Noted  . Polyneuropathy due to secondary diabetes mellitus ([place]) 05/25/2019  . Arthritis of right sacroiliac joint 05/25/2019  . Cervical  disc disorder 05/25/2019  . Falls 05/25/2019  . General unsteadiness 05/25/2019  . Encounter for well woman exam with routine gynecological exam 01/09/2019  . Screening for colorectal cancer 01/09/2019  . Encounter for screening colonoscopy 12/13/2017  . Hyperparathyroidism, primary ([place]) 12/22/2016  . Vitamin D deficiency 10/31/2015  . Hyperuricemia 05/08/2013  . ONYCHOMYCOSIS, TOENAILS 02/22/2009  . NEVI, MULTIPLE 02/22/2009  . Diabetes  mellitus ([place]) 01/25/2008  . BACK PAIN WITH RADICULOPATHY 11/09/2007  . Hyperlipidemia LDL goal <100 08/26/2007  . Morbid obesity ([place]) 08/26/2007  . Essential hypertension 08/26/2007  . Obstructive sleep apnea 04/20/2007    11:46 AM, 03/04/20 Mearl Latin PT, DPT Physical Therapist at [place], [name], [address] Phone: [phone]   Fax:  [phone]  Name: Pamela Stein MRN: [id number] Date of Birth: 1955/02/17

## 2020-03-05 ENCOUNTER — Encounter: Admission: RE | Payer: Self-pay | Source: Home / Self Care

## 2020-03-05 ENCOUNTER — Ambulatory Visit
Admission: RE | Admit: 2020-03-05 | Payer: Federal, State, Local not specified - PPO | Source: Home / Self Care | Admitting: Orthopedic Surgery

## 2020-03-05 SURGERY — ARTHROPLASTY, KNEE, TOTAL
Anesthesia: Choice | Site: Knee | Laterality: Left

## 2020-03-06 ENCOUNTER — Encounter (HOSPITAL_COMMUNITY): Payer: Self-pay

## 2020-03-06 ENCOUNTER — Ambulatory Visit (HOSPITAL_COMMUNITY): Payer: Federal, State, Local not specified - PPO

## 2020-03-06 ENCOUNTER — Other Ambulatory Visit: Payer: Self-pay

## 2020-03-06 DIAGNOSIS — Z9181 History of falling: Secondary | ICD-10-CM | POA: Diagnosis not present

## 2020-03-06 DIAGNOSIS — R2689 Other abnormalities of gait and mobility: Secondary | ICD-10-CM | POA: Diagnosis not present

## 2020-03-06 DIAGNOSIS — M6281 Muscle weakness (generalized): Secondary | ICD-10-CM | POA: Diagnosis not present

## 2020-03-06 NOTE — Patient Instructions (Addendum)
Quad Set    With other leg bent, foot flat, slowly tighten muscles on thigh of straight leg while counting out loud to 5". Repeat with other leg. Repeat 10 times. Do 2 sessions per day.  http://gt2.exer.us/276   Copyright  VHI. All rights reserved.   Straight Leg Raise    Tighten stomach and slowly raise locked right leg 12 inches from floor. Repeat 10 times per set. Do 2 sets per day.  http://orth.exer.us/1103   Copyright  VHI. All rights reserved.   Bridge    Lie back, legs bent. Inhale, pressing hips up. Keeping ribs in, lengthen lower back. Exhale, rolling down along spine from top. Repeat 10 times. Do 2 sessions per day.  http://pm.exer.us/55   Copyright  VHI. All rights reserved.   Functional Quadriceps: Sit to Stand    Sit on edge of chair, feet flat on floor. Stand upright, extending knees fully. Repeat 10 times per set. Do 2 sets per day.  http://orth.exer.us/735   Copyright  VHI. All rights reserved.   Tandem Stance    Right foot in front of left, heel touching toe both feet "straight ahead". Stand on Foot Triangle of Support with both feet. Balance in this position 30 seconds. Do with left foot in front of right.  Copyright  VHI. All rights reserved.   Single Leg Balance: Eyes Open    Stand on right leg with eyes open. Hold 30 seconds. 3 reps.  http://ggbe.exer.us/5   Copyright  VHI. All rights reserved.

## 2020-03-06 NOTE — Therapy (Signed)
[place], [name], [address] Phone: [phone]   Fax:  [phone]  Physical Therapy Treatment  Patient Details  Name: Pamela Stein MRN: [id number] Date of Birth: Jun 11, 1955 Referring Provider (PT): Phillips Odor MD   Encounter Date: 03/06/2020   PT End of Session - 03/06/20 1142    Visit Number 2    Number of Visits 8    Date for PT Re-Evaluation 04/01/20    Authorization Type BCBS (no auth, VL 50- 0 used)    Authorization - Visit Number 2    Authorization - Number of Visits 50    Progress Note Due on Visit 10    PT Start Time 1136    PT Stop Time 1215    PT Time Calculation (min) 39 min    Activity Tolerance Patient tolerated treatment well    Behavior During Therapy [facility] for tasks assessed/performed           Past Medical History:  Diagnosis Date  . Anxiety   . Back pain   . Bronchitis   . Complication of anesthesia    stopped breathing after endometrial ablation procedure prior to her being diagn. with OSA  . Diabetes mellitus   . Dizziness   . Heart murmur   . History of gout   . Hyperlipidemia   . Hypertension   . Neuropathy   . Obesity   . Obstructive sleep apnea    CPAP    Past Surgical History:  Procedure Laterality Date  . ABDOMINAL HYSTERECTOMY    . ADENOIDECTOMY    . CATARACT EXTRACTION Left    right 02/2018  . COLONOSCOPY  2008   diminutive rectal polyp, s/p removal. polypoid mucosa  . COLONOSCOPY WITH PROPOFOL N/A 01/03/2018   Procedure: COLONOSCOPY WITH PROPOFOL;  Surgeon: Daneil Dolin, MD;  [facility];  Service: Endoscopy;  Laterality: N/A;  12:00pm  . DILATION AND CURETTAGE OF UTERUS    . ENDOMETRIAL ABLATION    . EYE SURGERY Left 12/04/2013   cataract  . PARATHYROIDECTOMY N/A 12/22/2016   Procedure: PARATHYROIDECTOMY, NECK EXPLORATION;  Surgeon: Jackolyn Confer, MD;  Location: [facility];  Service: General;  Laterality: N/A;  . TONSILLECTOMY       There were no vitals filed for this visit.   Subjective Assessment - 03/06/20 1140    Subjective Pt stated Lt knee has some dull pain today.  They rescheduled TKR due to covid.    Patient Stated Goals stabilize her knee some    Currently in Pain? Yes    Pain Score 2     Pain Location Knee    Pain Orientation Left    Pain Descriptors / Indicators Dull    Pain Type Chronic pain    Pain Onset More than a month ago    Pain Frequency Constant    Aggravating Factors  hard floor standing    Pain Relieving Factors biofreeze, hot tub bath                             [facility] Adult PT Treatment/Exercise - 03/06/20 0001      Exercises   Exercises Knee/Hip      Knee/Hip Exercises: Standing   Heel Raises 10 reps    Heel Raises Limitations toe raise    Hip Abduction Both;10 reps    Abduction Limitations cueing to reduce ER    SLS Rt  8", Lt 10"    Other Standing Knee Exercises tandem stance 2x 30" with intermittent HHA      Knee/Hip Exercises: Seated   Long Arc Quad 10 reps    Sit to [employer] 5 reps;without UE support      Knee/Hip Exercises: Supine   Quad Sets Left;10 reps    Bridges 10 reps    Straight Leg Raises 10 reps                  PT Education - 03/06/20 1149    Education Details Reviewed goals, educated importance of HEP compliance for maximal benefits.  Reviewed current exercises completeing at home wiht some additional exercises added to HEP.    Person(s) Educated Patient    Methods Explanation;Demonstration;Verbal cues    Comprehension Verbalized understanding;Returned demonstration            PT Short Term Goals - 03/04/20 1140      PT SHORT TERM GOAL #1   Title Patient will be independent with HEP in order to improve functional outcomes.    Time 2    Period Weeks    Status New    Target Date 03/18/20      PT SHORT TERM GOAL #2   Title Pt to be able to single leg stance for 25" on both LE to reduce risk of falling    Time 2     Period Weeks    Status New    Target Date 03/18/20      PT SHORT TERM GOAL #3   Title Patient will report at least 25% improvement in symptoms for improved quality of life.    Time 2    Period Weeks    Status New    Target Date 03/18/20             PT Long Term Goals - 03/04/20 1141      PT LONG TERM GOAL #1   Title Patient will be able to perform stand x 5 in < 15 seconds to demonstrate improvement in functional mobility and reduced risk for falls.    Time 4    Period Weeks    Status New    Target Date 04/01/20      PT LONG TERM GOAL #2   Title Patient will report at least 75% improvement in symptoms for improved quality of life.    Time 4    Period Weeks    Status New    Target Date 04/01/20      PT LONG TERM GOAL #3   Title Patient will be able to ambulate at least 400 feet in 2MWT in order to demonstrate improved gait speed for community ambulation.    Time 4    Period Weeks    Status New    Target Date 04/01/20                 Plan - 03/06/20 1150    Clinical Impression Statement Reviewed goals, educated importance of HEP compliance for maximal benefits wiht therapy.  Reviewed current exercises she is completing at home wiht some additional exercises added for functional strengthening and prior TKR surgery.  Pt able to complete all exercises wiht no reports of pain.  Did require cueing for proper mechanics wtih standing abduction to reduce ER and posture cueing during static balance activities, did require intermittent HHA during tandem stance.    Personal Factors and Comorbidities Age;Fitness;Behavior Pattern;Past/Current Experience;Comorbidity 3+;Time since onset of injury/illness/exacerbation  Comorbidities anxiety, DM, HTN    Examination-Activity Limitations Locomotion Level;Transfers;Squat;Stairs;Stand;Lift;Bend    Examination-Participation Restrictions Cleaning;Occupation;Meal Prep;Church;Community Activity;Shop;Volunteer;Yard Work     Merchant navy officer Stable/Uncomplicated    Designer, jewellery Low    Rehab Potential Fair    PT Frequency 2x / week    PT Duration 4 weeks    PT Treatment/Interventions ADLs/Self Care Home Management;Aquatic Therapy;Cryotherapy;Electrical Stimulation;Iontophoresis 4mg /ml Dexamethasone;Moist Heat;Traction;Ultrasound;DME Instruction;Gait training;Stair training;Functional mobility training;Therapeutic activities;Therapeutic exercise;Balance training;Neuromuscular re-education;Patient/family education;Manual techniques;Manual lymph drainage;Compression bandaging;Dry needling;Energy conservation;Spinal Manipulations;Joint Manipulations;Splinting;Taping    PT Next Visit Plan begin L knee strengthening and balance training, review prior HEP and give updated handouts if needed, work to transition to [employer], give [employer] before d/c to transition to self management    PT Home Exercise Plan currently performing :STS, tandem stance, SLS; 03/06/20: heel/toe raises, quad set, SLR, bridge, STS    Consulted and Agree with Plan of Care Patient           Patient will benefit from skilled therapeutic intervention in order to improve the following deficits and impairments:  Abnormal gait,Decreased endurance,Decreased activity tolerance,Pain,Decreased balance,Impaired flexibility,Improper body mechanics,Decreased strength,Decreased mobility  Visit Diagnosis: Muscle weakness (generalized)  History of falling  Other abnormalities of gait and mobility     Problem List Patient Active Problem List   Diagnosis Date Noted  . Polyneuropathy due to secondary diabetes mellitus ([place]) 05/25/2019  . Arthritis of right sacroiliac joint 05/25/2019  . Cervical disc disorder 05/25/2019  . Falls 05/25/2019  . General unsteadiness 05/25/2019  . Encounter for well woman exam with routine gynecological exam 01/09/2019  . Screening for colorectal cancer 01/09/2019  . Encounter for screening  colonoscopy 12/13/2017  . Hyperparathyroidism, primary ([place]) 12/22/2016  . Vitamin D deficiency 10/31/2015  . Hyperuricemia 05/08/2013  . ONYCHOMYCOSIS, TOENAILS 02/22/2009  . NEVI, MULTIPLE 02/22/2009  . Diabetes mellitus ([place]) 01/25/2008  . BACK PAIN WITH RADICULOPATHY 11/09/2007  . Hyperlipidemia LDL goal <100 08/26/2007  . Morbid obesity ([place]) 08/26/2007  . Essential hypertension 08/26/2007  . Obstructive sleep apnea 04/20/2007   Ihor Austin, LPTA/CLT; CBIS [phone]  Aldona Lento 03/06/2020, 12:21 PM  [place], [name], [address] Phone: [phone]   Fax:  [phone]  Name: MYKENZI VANZILE MRN: [id number] Date of Birth: [birth year]

## 2020-03-11 ENCOUNTER — Encounter (HOSPITAL_COMMUNITY): Payer: Self-pay | Admitting: Physical Therapy

## 2020-03-11 ENCOUNTER — Ambulatory Visit (HOSPITAL_COMMUNITY): Payer: Federal, State, Local not specified - PPO | Attending: Neurology | Admitting: Physical Therapy

## 2020-03-11 ENCOUNTER — Encounter (HOSPITAL_COMMUNITY): Payer: Self-pay

## 2020-03-11 DIAGNOSIS — M6281 Muscle weakness (generalized): Secondary | ICD-10-CM | POA: Diagnosis not present

## 2020-03-11 DIAGNOSIS — Z9181 History of falling: Secondary | ICD-10-CM | POA: Insufficient documentation

## 2020-03-11 DIAGNOSIS — R2689 Other abnormalities of gait and mobility: Secondary | ICD-10-CM | POA: Diagnosis not present

## 2020-03-11 NOTE — Therapy (Signed)
[place], [name], [address] Phone: [phone]   Fax:  [phone]  Physical Therapy Treatment  Patient Details  Name: Pamela Stein MRN: [id number] Date of Birth: 08-Aug-1955 Referring Provider (PT): Phillips Odor MD   Encounter Date: 03/11/2020   PT End of Session - 03/11/20 1728    Visit Number 3    Number of Visits 8    Date for PT Re-Evaluation 04/01/20    Authorization Type BCBS (no auth, VL 50- 0 used)    Authorization - Visit Number 3    Authorization - Number of Visits 50    Progress Note Due on Visit 10    PT Start Time 1410    PT Stop Time 1450    PT Time Calculation (min) 40 min    Activity Tolerance Patient tolerated treatment well    Behavior During Therapy [facility] for tasks assessed/performed           Past Medical History:  Diagnosis Date  . Anxiety   . Back pain   . Bronchitis   . Complication of anesthesia    stopped breathing after endometrial ablation procedure prior to her being diagn. with OSA  . Diabetes mellitus   . Dizziness   . Heart murmur   . History of gout   . Hyperlipidemia   . Hypertension   . Neuropathy   . Obesity   . Obstructive sleep apnea    CPAP    Past Surgical History:  Procedure Laterality Date  . ABDOMINAL HYSTERECTOMY    . ADENOIDECTOMY    . CATARACT EXTRACTION Left    right 02/2018  . COLONOSCOPY  2008   diminutive rectal polyp, s/p removal. polypoid mucosa  . COLONOSCOPY WITH PROPOFOL N/A 01/03/2018   Procedure: COLONOSCOPY WITH PROPOFOL;  Surgeon: Daneil Dolin, MD;  [facility];  Service: Endoscopy;  Laterality: N/A;  12:00pm  . DILATION AND CURETTAGE OF UTERUS    . ENDOMETRIAL ABLATION    . EYE SURGERY Left 12/04/2013   cataract  . PARATHYROIDECTOMY N/A 12/22/2016   Procedure: PARATHYROIDECTOMY, NECK EXPLORATION;  Surgeon: Jackolyn Confer, MD;  Location: [facility];  Service: General;  Laterality: N/A;  . TONSILLECTOMY       There were no vitals filed for this visit.   Subjective Assessment - 03/11/20 1727    Subjective Patient states knee is a little stiff and sore today.    Patient Stated Goals stabilize her knee some    Currently in Pain? Yes    Pain Score 1     Pain Location Knee    Pain Orientation Left;Posterior    Pain Descriptors / Indicators Sore;Tightness    Pain Type Chronic pain    Pain Onset More than a month ago    Pain Frequency Intermittent                         Adult Aquatic Therapy - 03/11/20 1732      Treatment   Gait dynamic warmup (pool ambulation, sidestepping) 3RT both using dumbell floats    Exercises calf stretch at wall 3 x 30" each, heel raises 3 x 10, mini squats 3 x 10, tandem stance 3 x 30" with dumbbell support, 1 x 30" wihtout, lunge stretch on steps 3 x 30" each  PT Short Term Goals - 03/04/20 1140      PT SHORT TERM GOAL #1   Title Patient will be independent with HEP in order to improve functional outcomes.    Time 2    Period Weeks    Status New    Target Date 03/18/20      PT SHORT TERM GOAL #2   Title Pt to be able to single leg stance for 25" on both LE to reduce risk of falling    Time 2    Period Weeks    Status New    Target Date 03/18/20      PT SHORT TERM GOAL #3   Title Patient will report at least 25% improvement in symptoms for improved quality of life.    Time 2    Period Weeks    Status New    Target Date 03/18/20             PT Long Term Goals - 03/04/20 1141      PT LONG TERM GOAL #1   Title Patient will be able to perform stand x 5 in < 15 seconds to demonstrate improvement in functional mobility and reduced risk for falls.    Time 4    Period Weeks    Status New    Target Date 04/01/20      PT LONG TERM GOAL #2   Title Patient will report at least 75% improvement in symptoms for improved quality of life.    Time 4    Period Weeks    Status New    Target Date  04/01/20      PT LONG TERM GOAL #3   Title Patient will be able to ambulate at least 400 feet in 2MWT in order to demonstrate improved gait speed for community ambulation.    Time 4    Period Weeks    Status New    Target Date 04/01/20                 Plan - 03/11/20 1728    Clinical Impression Statement Patient tolerated session well today. Iniated aquatic therapy. Focused session on static and dynmaaic balance as well as LE strength and mobility. Patient showed good static balance today using pool dumbbell support and was able to progress to semi tandem stance with no support. Patient required verbal cues for proper form during mini squatting and calf stretching at pool wall. Patient noting decreased knee stiffness post treatment. Patient will continue to benefit from skilled therapy services to progress LE strength and balance for reduced future risk for falls.    Personal Factors and Comorbidities Age;Fitness;Behavior Pattern;Past/Current Experience;Comorbidity 3+;Time since onset of injury/illness/exacerbation    Comorbidities anxiety, DM, HTN    Examination-Activity Limitations Locomotion Level;Transfers;Squat;Stairs;Stand;Lift;Bend    Examination-Participation Restrictions Cleaning;Occupation;Meal Prep;Church;Community Activity;Shop;Volunteer;Yard Work    Stability/Clinical Decision Making Stable/Uncomplicated    Rehab Potential Fair    PT Frequency 2x / week    PT Duration 4 weeks    PT Treatment/Interventions ADLs/Self Care Home Management;Aquatic Therapy;Cryotherapy;Electrical Stimulation;Iontophoresis 4mg /ml Dexamethasone;Moist Heat;Traction;Ultrasound;DME Instruction;Gait training;Stair training;Functional mobility training;Therapeutic activities;Therapeutic exercise;Balance training;Neuromuscular re-education;Patient/family education;Manual techniques;Manual lymph drainage;Compression bandaging;Dry needling;Energy conservation;Spinal Manipulations;Joint  Manipulations;Splinting;Taping    PT Next Visit Plan begin L knee strengthening and balance training, review prior HEP and give updated handouts if needed, work to transition to [employer], give [employer] before d/c to transition to self management    PT Home Exercise Plan currently performing :STS, tandem stance, SLS; 03/06/20:  heel/toe raises, quad set, SLR, bridge, STS    Consulted and Agree with Plan of Care Patient           Patient will benefit from skilled therapeutic intervention in order to improve the following deficits and impairments:  Abnormal gait,Decreased endurance,Decreased activity tolerance,Pain,Decreased balance,Impaired flexibility,Improper body mechanics,Decreased strength,Decreased mobility  Visit Diagnosis: Muscle weakness (generalized)  History of falling  Other abnormalities of gait and mobility     Problem List Patient Active Problem List   Diagnosis Date Noted  . Polyneuropathy due to secondary diabetes mellitus ([place]) 05/25/2019  . Arthritis of right sacroiliac joint 05/25/2019  . Cervical disc disorder 05/25/2019  . Falls 05/25/2019  . General unsteadiness 05/25/2019  . Encounter for well woman exam with routine gynecological exam 01/09/2019  . Screening for colorectal cancer 01/09/2019  . Encounter for screening colonoscopy 12/13/2017  . Hyperparathyroidism, primary ([place]) 12/22/2016  . Vitamin D deficiency 10/31/2015  . Hyperuricemia 05/08/2013  . ONYCHOMYCOSIS, TOENAILS 02/22/2009  . NEVI, MULTIPLE 02/22/2009  . Diabetes mellitus ([place]) 01/25/2008  . BACK PAIN WITH RADICULOPATHY 11/09/2007  . Hyperlipidemia LDL goal <100 08/26/2007  . Morbid obesity ([place]) 08/26/2007  . Essential hypertension 08/26/2007  . Obstructive sleep apnea 04/20/2007    5:36 PM, 03/11/20 Josue Hector PT DPT  Physical Therapist with [facility]  [phone] [place], [name], [address] Phone: [phone]   Fax:  [phone]  Name: Pamela Stein MRN: [id number] Date of Birth: 09-20-1955

## 2020-03-13 ENCOUNTER — Ambulatory Visit (HOSPITAL_COMMUNITY): Payer: Federal, State, Local not specified - PPO | Attending: Neurology | Admitting: Physical Therapy

## 2020-03-13 ENCOUNTER — Other Ambulatory Visit: Payer: Self-pay

## 2020-03-13 ENCOUNTER — Encounter (HOSPITAL_COMMUNITY): Payer: Self-pay | Admitting: Physical Therapy

## 2020-03-13 DIAGNOSIS — Z9181 History of falling: Secondary | ICD-10-CM | POA: Diagnosis not present

## 2020-03-13 DIAGNOSIS — R2689 Other abnormalities of gait and mobility: Secondary | ICD-10-CM

## 2020-03-13 DIAGNOSIS — M6281 Muscle weakness (generalized): Secondary | ICD-10-CM

## 2020-03-13 NOTE — Therapy (Signed)
[place], [name], [address] Phone: [phone]   Fax:  [phone]  Physical Therapy Treatment  Patient Details  Name: Pamela Stein MRN: [id number] Date of Birth: [birth year] Referring Provider (PT): Phillips Odor MD   Encounter Date: 03/13/2020   PT End of Session - 03/13/20 1042    Visit Number 4    Number of Visits 8    Date for PT Re-Evaluation 04/01/20    Authorization Type BCBS (no auth, VL 50- 0 used)    Authorization - Visit Number 4    Authorization - Number of Visits 50    Progress Note Due on Visit 10    PT Start Time [id number]    PT Stop Time 1115    PT Time Calculation (min) 40 min    Activity Tolerance Patient tolerated treatment well    Behavior During Therapy [facility] for tasks assessed/performed           Past Medical History:  Diagnosis Date  . Anxiety   . Back pain   . Bronchitis   . Complication of anesthesia    stopped breathing after endometrial ablation procedure prior to her being diagn. with OSA  . Diabetes mellitus   . Dizziness   . Heart murmur   . History of gout   . Hyperlipidemia   . Hypertension   . Neuropathy   . Obesity   . Obstructive sleep apnea    CPAP    Past Surgical History:  Procedure Laterality Date  . ABDOMINAL HYSTERECTOMY    . ADENOIDECTOMY    . CATARACT EXTRACTION Left    right 02/2018  . COLONOSCOPY  2008   diminutive rectal polyp, s/p removal. polypoid mucosa  . COLONOSCOPY WITH PROPOFOL N/A 01/03/2018   Procedure: COLONOSCOPY WITH PROPOFOL;  Surgeon: Daneil Dolin, MD;  [facility];  Service: Endoscopy;  Laterality: N/A;  12:00pm  . DILATION AND CURETTAGE OF UTERUS    . ENDOMETRIAL ABLATION    . EYE SURGERY Left 12/04/2013   cataract  . PARATHYROIDECTOMY N/A 12/22/2016   Procedure: PARATHYROIDECTOMY, NECK EXPLORATION;  Surgeon: Jackolyn Confer, MD;  Location: [facility];  Service: General;  Laterality: N/A;  . TONSILLECTOMY       There were no vitals filed for this visit.   Subjective Assessment - 03/13/20 1038    Subjective Patient says she felt good after Monday. Says she just got off work and is having a bad day today. Having some pain and felt off with her balance when she made a quick turn at work today.    Patient Stated Goals stabilize her knee some    Currently in Pain? Yes    Pain Score 3     Pain Location Knee    Pain Orientation Left;Anterior    Pain Descriptors / Indicators Aching    Pain Type Chronic pain    Pain Onset More than a month ago                             [facility] Adult PT Treatment/Exercise - 03/13/20 0001      Knee/Hip Exercises: Stretches   Gastroc Stretch Both;3 reps;30 seconds    Gastroc Stretch Limitations slant board      Knee/Hip Exercises: Field seismologist for Strengthening   Total Gym Leg Press `    Other Machine machine walkouts 30# x 10      Knee/Hip  Exercises: Standing   Heel Raises Both;2 sets;10 reps    Lateral Step Up Both;2 sets;10 reps;Hand Hold: 1;Step Height: 4"    Forward Step Up Both;2 sets;10 reps;Hand Hold: 1;Step Height: 4"    Rocker Board 2 minutes   DF/PF/INV/EV   SLS 3 x 10" solid floor    Other Standing Knee Exercises tandem stance 3 x 30" solid floor      Knee/Hip Exercises: Seated   Heel Slides AAROM;Left;5 reps    Heel Slides Limitations 10 second holds    Sit to Sand 10 reps;2 sets;without UE support                    PT Short Term Goals - 03/04/20 1140      PT SHORT TERM GOAL #1   Title Patient will be independent with HEP in order to improve functional outcomes.    Time 2    Period Weeks    Status New    Target Date 03/18/20      PT SHORT TERM GOAL #2   Title Pt to be able to single leg stance for 25" on both LE to reduce risk of falling    Time 2    Period Weeks    Status New    Target Date 03/18/20      PT SHORT TERM GOAL #3   Title Patient will report at least 25% improvement in symptoms for  improved quality of life.    Time 2    Period Weeks    Status New    Target Date 03/18/20             PT Long Term Goals - 03/04/20 1141      PT LONG TERM GOAL #1   Title Patient will be able to perform stand x 5 in < 15 seconds to demonstrate improvement in functional mobility and reduced risk for falls.    Time 4    Period Weeks    Status New    Target Date 04/01/20      PT LONG TERM GOAL #2   Title Patient will report at least 75% improvement in symptoms for improved quality of life.    Time 4    Period Weeks    Status New    Target Date 04/01/20      PT LONG TERM GOAL #3   Title Patient will be able to ambulate at least 400 feet in 2MWT in order to demonstrate improved gait speed for community ambulation.    Time 4    Period Weeks    Status New    Target Date 04/01/20                 Plan - 03/13/20 1114    Clinical Impression Statement Patient tolerated session well today. Patient shows improved static balance with tandem stance and was progressed to rocker board and single leg balance. Patient noted some pulling and discomfort in anterior LT knee with steps and sit to stands. Instructed patient on seated heels slides for knee flexion stretching. This improved symptoms and decreased discomfort. Patient educated on purpose and function of all added activity today. Added machine walkouts for LE strength progressions. Patient will continue to benefit from skilled therapy services to progress strength and balance for decreased knee pain and reduced risk for falls.    Personal Factors and Comorbidities Age;Fitness;Behavior Pattern;Past/Current Experience;Comorbidity 3+;Time since onset of injury/illness/exacerbation    Comorbidities anxiety, DM, HTN  Examination-Activity Limitations Locomotion Level;Transfers;Squat;Stairs;Stand;Lift;Bend    Examination-Participation Restrictions Cleaning;Occupation;Meal Prep;Church;Community Activity;Shop;Volunteer;Yard Work     Stability/Clinical Decision Making Stable/Uncomplicated    Rehab Potential Fair    PT Frequency 2x / week    PT Duration 4 weeks    PT Treatment/Interventions ADLs/Self Care Home Management;Aquatic Therapy;Cryotherapy;Electrical Stimulation;Iontophoresis 4mg /ml Dexamethasone;Moist Heat;Traction;Ultrasound;DME Instruction;Gait training;Stair training;Functional mobility training;Therapeutic activities;Therapeutic exercise;Balance training;Neuromuscular re-education;Patient/family education;Manual techniques;Manual lymph drainage;Compression bandaging;Dry needling;Energy conservation;Spinal Manipulations;Joint Manipulations;Splinting;Taping    PT Next Visit Plan Conitnue to progress funcitonal knee strength and balance as able. Add compliant surface to balance acitivty. Begin dynamic balance and add resistance to standing LE strengthening,    PT Home Exercise Plan currently performing :STS, tandem stance, SLS; 03/06/20: heel/toe raises, quad set, SLR, bridge, STS 2/2 single leg balance with support    Consulted and Agree with Plan of Care Patient           Patient will benefit from skilled therapeutic intervention in order to improve the following deficits and impairments:  Abnormal gait,Decreased endurance,Decreased activity tolerance,Pain,Decreased balance,Impaired flexibility,Improper body mechanics,Decreased strength,Decreased mobility  Visit Diagnosis: Muscle weakness (generalized)  History of falling  Other abnormalities of gait and mobility     Problem List Patient Active Problem List   Diagnosis Date Noted  . Polyneuropathy due to secondary diabetes mellitus ([place]) 05/25/2019  . Arthritis of right sacroiliac joint 05/25/2019  . Cervical disc disorder 05/25/2019  . Falls 05/25/2019  . General unsteadiness 05/25/2019  . Encounter for well woman exam with routine gynecological exam 01/09/2019  . Screening for colorectal cancer 01/09/2019  . Encounter for screening colonoscopy  12/13/2017  . Hyperparathyroidism, primary ([place]) 12/22/2016  . Vitamin D deficiency 10/31/2015  . Hyperuricemia 05/08/2013  . ONYCHOMYCOSIS, TOENAILS 02/22/2009  . NEVI, MULTIPLE 02/22/2009  . Diabetes mellitus ([place]) 01/25/2008  . BACK PAIN WITH RADICULOPATHY 11/09/2007  . Hyperlipidemia LDL goal <100 08/26/2007  . Morbid obesity ([place]) 08/26/2007  . Essential hypertension 08/26/2007  . Obstructive sleep apnea 04/20/2007    11:17 AM, 03/13/20 Josue Hector PT DPT  Physical Therapist with [facility]  [phone] [place] [address] [place], [name], [address] Phone: [phone]   Fax:  [phone]  Name: Pamela Stein MRN: [id number] Date of Birth: Dec 07, 1955

## 2020-03-14 ENCOUNTER — Ambulatory Visit (INDEPENDENT_AMBULATORY_CARE_PROVIDER_SITE_OTHER): Payer: Federal, State, Local not specified - PPO | Admitting: Orthopedic Surgery

## 2020-03-14 ENCOUNTER — Encounter: Payer: Self-pay | Admitting: Orthopedic Surgery

## 2020-03-14 DIAGNOSIS — G8929 Other chronic pain: Secondary | ICD-10-CM

## 2020-03-14 DIAGNOSIS — M1710 Unilateral primary osteoarthritis, unspecified knee: Secondary | ICD-10-CM

## 2020-03-14 DIAGNOSIS — M25562 Pain in left knee: Secondary | ICD-10-CM

## 2020-03-14 DIAGNOSIS — M1712 Unilateral primary osteoarthritis, left knee: Secondary | ICD-10-CM | POA: Diagnosis not present

## 2020-03-14 NOTE — Progress Notes (Signed)
Chief Complaint  Patient presents with  . Knee Pain    Left knee, Pt reports pain is a 2. [place]: [id number], Lot: [id number]    Encounter Diagnoses  Name Primary?  . Primary localized osteoarthritis of knee Yes  . Chronic pain of left knee      supartz 1/3   Procedure note for injection of hyaluronic acid   Diagnosis osteoarthritis of the knee  Verbal consent was obtained to inject the knee with HYALURONIC ACID . Timeout was completed to confirm the injection site as the left    Knee  Ethyl chloride spray was used for anesthesia Alcohol was used to prep the skin. The infrapatellar lateral portal was used as an injection site and 1 vial of hyaluronic acid  was injected into the knee  Specific Co. Preparation: supartz  No complications were noted   1 week 2/3

## 2020-03-18 ENCOUNTER — Ambulatory Visit (HOSPITAL_COMMUNITY): Payer: Federal, State, Local not specified - PPO | Admitting: Physical Therapy

## 2020-03-18 ENCOUNTER — Encounter (HOSPITAL_COMMUNITY): Payer: Self-pay | Admitting: Physical Therapy

## 2020-03-18 DIAGNOSIS — M6281 Muscle weakness (generalized): Secondary | ICD-10-CM | POA: Diagnosis not present

## 2020-03-18 DIAGNOSIS — R2689 Other abnormalities of gait and mobility: Secondary | ICD-10-CM | POA: Diagnosis not present

## 2020-03-18 DIAGNOSIS — Z9181 History of falling: Secondary | ICD-10-CM | POA: Diagnosis not present

## 2020-03-18 NOTE — Therapy (Signed)
[facility] [address] [place], [place], [address] Phone: [phone]   Fax:  [phone]  Physical Therapy Treatment  Patient Details  Name: Pamela Stein MRN: [id number] Date of Birth: Apr 25, 1955 Referring Provider (PT): Beryle BeamsKofi Doonquah MD   Encounter Date: 03/18/2020   PT End of Session - 03/18/20 1531    Visit Number 5    Number of Visits 8    Date for PT Re-Evaluation 04/01/20    Authorization Type BCBS (no auth, VL 50- 0 used)    Authorization - Visit Number 5    Authorization - Number of Visits 50    Progress Note Due on Visit 10    PT Start Time 1405    PT Stop Time 1450    PT Time Calculation (min) 45 min    Activity Tolerance Patient tolerated treatment well    Behavior [facility] Therapy [facility] for tasks assessed/performed           Past Medical History:  Diagnosis Date  . Anxiety   . Back pain   . Bronchitis   . Complication of anesthesia    stopped breathing after endometrial ablation procedure prior to her being diagn. with OSA  . Diabetes mellitus   . Dizziness   . Heart murmur   . History of gout   . Hyperlipidemia   . Hypertension   . Neuropathy   . Obesity   . Obstructive sleep apnea    CPAP    Past Surgical History:  Procedure Laterality Date  . ABDOMINAL HYSTERECTOMY    . ADENOIDECTOMY    . CATARACT EXTRACTION Left    right 02/2018  . COLONOSCOPY  2008   diminutive rectal polyp, s/p removal. polypoid mucosa  . COLONOSCOPY WITH PROPOFOL N/A 01/03/2018   Procedure: COLONOSCOPY WITH PROPOFOL;  Surgeon: Corbin Adeourk, Robert M, MD;  [facility];  Service: Endoscopy;  Laterality: N/A;  12:00pm  . DILATION AND CURETTAGE OF UTERUS    . ENDOMETRIAL ABLATION    . EYE SURGERY Left 12/04/2013   cataract  . PARATHYROIDECTOMY N/A 12/22/2016   Procedure: PARATHYROIDECTOMY, NECK EXPLORATION;  Surgeon: Avel Peaceosenbower, Todd, MD;  Location: [facility];  Service: General;  Laterality: N/A;  . TONSILLECTOMY       There were no vitals filed for this visit.   Subjective Assessment - 03/18/20 1537    Subjective Patient reports she is still having some trouble with her knee. She got an injection recently, thinks it may be helpping a little. She feels her balance isnt doing too good. Feels unsteady at work but hasnt fallen.    Limitations Walking;Standing;House hold activities    How long can you walk comfortably? 15 minutes    Patient Stated Goals stabilize her knee some    Currently in Pain? Yes    Pain Score 2     Pain Location Knee    Pain Orientation Left;Anterior    Pain Descriptors / Indicators Aching;Dull    Pain Type Chronic pain    Pain Onset More than a month ago    Pain Frequency Intermittent                         Adult Aquatic Therapy - 03/18/20 1539      Treatment   Gait dynamic warmup (pool ambulation, sidestepping) 2RT both using dumbell floats    Exercises calf stretch at wall 3 x 30" each, heel raises 3 x 10, toe  raises, mini squats 3 x 10, standing hip abduction 3 x10, hip extension 3 x 10, tandem stance 3 x 30", tandem gait 10' 2RT using pool dumbbells,  lunge stretch on steps 3 x 30" each                        PT Short Term Goals - 03/04/20 1140      PT SHORT TERM GOAL #1   Title Patient will be independent with HEP in order to improve functional outcomes.    Time 2    Period Weeks    Status New    Target Date 03/18/20      PT SHORT TERM GOAL #2   Title Pt to be able to single leg stance for 25" on both LE to reduce risk of falling    Time 2    Period Weeks    Status New    Target Date 03/18/20      PT SHORT TERM GOAL #3   Title Patient will report at least 25% improvement in symptoms for improved quality of life.    Time 2    Period Weeks    Status New    Target Date 03/18/20             PT Long Term Goals - 03/04/20 1141      PT LONG TERM GOAL #1   Title Patient will be able to perform stand x 5 in < 15 seconds to  demonstrate improvement in functional mobility and reduced risk for falls.    Time 4    Period Weeks    Status New    Target Date 04/01/20      PT LONG TERM GOAL #2   Title Patient will report at least 75% improvement in symptoms for improved quality of life.    Time 4    Period Weeks    Status New    Target Date 04/01/20      PT LONG TERM GOAL #3   Title Patient will be able to ambulate at least 400 feet in 2MWT in order to demonstrate improved gait speed for community ambulation.    Time 4    Period Weeks    Status New    Target Date 04/01/20                 Plan - 03/18/20 1541    Clinical Impression Statement Patient tolerated session well overall today. She did note some discomfort in LT posterior hip during mini squats. Discomfort decreased with further exercise and stretching. Patietn showing good static balance and was able to progress to dynamic tandem gait in pool. She was well challenged with this. Also added standing hip abduction and extension for LE strength progressions. Patient cued on proper form. Patient will continue to benefit from skilled therapy services to progress strength and balance to reduce pain and improve LOF with ADLs.    Personal Factors and Comorbidities Age;Fitness;Behavior Pattern;Past/Current Experience;Comorbidity 3+;Time since onset of injury/illness/exacerbation    Comorbidities anxiety, DM, HTN    Examination-Activity Limitations Locomotion Level;Transfers;Squat;Stairs;Stand;Lift;Bend    Examination-Participation Restrictions Cleaning;Occupation;Meal Prep;Church;Community Activity;Shop;Volunteer;Yard Work    Stability/Clinical Decision Making Stable/Uncomplicated    Rehab Potential Fair    PT Frequency 2x / week    PT Duration 4 weeks    PT Treatment/Interventions ADLs/Self Care Home Management;Aquatic Therapy;Cryotherapy;Electrical Stimulation;Iontophoresis 4mg /ml Dexamethasone;Moist Heat;Traction;Ultrasound;DME Instruction;Gait  training;Stair training;Functional mobility training;Therapeutic activities;Therapeutic exercise;Balance training;Neuromuscular re-education;Patient/family education;Manual techniques;Manual lymph drainage;Compression  bandaging;Dry needling;Energy conservation;Spinal Manipulations;Joint Manipulations;Splinting;Taping    PT Next Visit Plan Conitnue to progress funcitonal knee strength and balance as able. Add compliant surface to balance acitivty. Begin dynamic balance and add resistance to standing LE strengthening,    PT Home Exercise Plan currently performing :STS, tandem stance, SLS; 03/06/20: heel/toe raises, quad set, SLR, bridge, STS 2/2 single leg balance with support    Consulted and Agree with Plan of Care Patient           Patient will benefit from skilled therapeutic intervention in order to improve the following deficits and impairments:  Abnormal gait,Decreased endurance,Decreased activity tolerance,Pain,Decreased balance,Impaired flexibility,Improper body mechanics,Decreased strength,Decreased mobility  Visit Diagnosis: Muscle weakness (generalized)  History of falling  Other abnormalities of gait and mobility     Problem List Patient Active Problem List   Diagnosis Date Noted  . Polyneuropathy due to secondary diabetes mellitus ([place]) 05/25/2019  . Arthritis of right sacroiliac joint 05/25/2019  . Cervical disc disorder 05/25/2019  . Falls 05/25/2019  . General unsteadiness 05/25/2019  . Encounter for well woman exam with routine gynecological exam 01/09/2019  . Screening for colorectal cancer 01/09/2019  . Encounter for screening colonoscopy 12/13/2017  . Hyperparathyroidism, primary ([place]) 12/22/2016  . Vitamin D deficiency 10/31/2015  . Hyperuricemia 05/08/2013  . ONYCHOMYCOSIS, TOENAILS 02/22/2009  . NEVI, MULTIPLE 02/22/2009  . Diabetes mellitus ([place]) 01/25/2008  . BACK PAIN WITH RADICULOPATHY 11/09/2007  . Hyperlipidemia LDL goal <100 08/26/2007  . Morbid  obesity ([place]) 08/26/2007  . Essential hypertension 08/26/2007  . Obstructive sleep apnea 04/20/2007    3:47 PM, 03/18/20 Josue Hector PT DPT  Physical Therapist with [facility]  [phone] [place] [address] [place], [name], [address] Phone: [phone]   Fax:  [phone]  Name: MYSTIC LABO MRN: [id number] Date of Birth: 1955-03-03

## 2020-03-19 ENCOUNTER — Other Ambulatory Visit: Payer: Self-pay

## 2020-03-20 ENCOUNTER — Ambulatory Visit: Payer: Federal, State, Local not specified - PPO | Admitting: Orthopedic Surgery

## 2020-03-20 ENCOUNTER — Encounter (HOSPITAL_COMMUNITY): Payer: Self-pay | Admitting: Physical Therapy

## 2020-03-20 ENCOUNTER — Other Ambulatory Visit: Payer: Self-pay

## 2020-03-20 ENCOUNTER — Ambulatory Visit (HOSPITAL_COMMUNITY): Payer: Federal, State, Local not specified - PPO | Admitting: Physical Therapy

## 2020-03-20 DIAGNOSIS — M6281 Muscle weakness (generalized): Secondary | ICD-10-CM | POA: Diagnosis not present

## 2020-03-20 DIAGNOSIS — Z9181 History of falling: Secondary | ICD-10-CM | POA: Diagnosis not present

## 2020-03-20 DIAGNOSIS — R2689 Other abnormalities of gait and mobility: Secondary | ICD-10-CM | POA: Diagnosis not present

## 2020-03-20 NOTE — Therapy (Signed)
[facility], [name], [address] Phone: [phone]   Fax:  [phone]  Physical Therapy Treatment  Patient Details  Name: Pamela Stein MRN: [id number] Date of Birth: 25-Sep-1955 Referring Provider (PT): Phillips Odor MD   Encounter Date: 03/20/2020   PT End of Session - 03/20/20 0923    Visit Number 6    Number of Visits 8    Date for PT Re-Evaluation 04/01/20    Authorization Type BCBS (no auth, VL 50- 0 used)    Authorization - Visit Number 6    Authorization - Number of Visits 50    Progress Note Due on Visit 10    PT Start Time [phone]    PT Stop Time 1005    PT Time Calculation (min) 42 min    Activity Tolerance Patient tolerated treatment well    Behavior During Therapy [facility] for tasks assessed/performed           Past Medical History:  Diagnosis Date  . Anxiety   . Back pain   . Bronchitis   . Complication of anesthesia    stopped breathing after endometrial ablation procedure prior to her being diagn. with OSA  . Diabetes mellitus   . Dizziness   . Heart murmur   . History of gout   . Hyperlipidemia   . Hypertension   . Neuropathy   . Obesity   . Obstructive sleep apnea    CPAP    Past Surgical History:  Procedure Laterality Date  . ABDOMINAL HYSTERECTOMY    . ADENOIDECTOMY    . CATARACT EXTRACTION Left    right 02/2018  . COLONOSCOPY  2008   diminutive rectal polyp, s/p removal. polypoid mucosa  . COLONOSCOPY WITH PROPOFOL N/A 01/03/2018   Procedure: COLONOSCOPY WITH PROPOFOL;  Surgeon: Daneil Dolin, MD;  [facility];  Service: Endoscopy;  Laterality: N/A;  12:00pm  . DILATION AND CURETTAGE OF UTERUS    . ENDOMETRIAL ABLATION    . EYE SURGERY Left 12/04/2013   cataract  . PARATHYROIDECTOMY N/A 12/22/2016   Procedure: PARATHYROIDECTOMY, NECK EXPLORATION;  Surgeon: Jackolyn Confer, MD;  [facility];  Service: General;  Laterality: N/A;  . TONSILLECTOMY       There were no vitals filed for this visit.   Subjective Assessment - 03/20/20 0924    Subjective Patient states her knee has been hurting. Her home exercises are going well.    Limitations Walking;Standing;House hold activities    How long can you walk comfortably? 15 minutes    Patient Stated Goals stabilize her knee some    Currently in Pain? Yes    Pain Score 2     Pain Location Knee    Pain Orientation Left    Pain Descriptors / Indicators Aching;Heaviness;Tightness    Pain Type Chronic pain    Pain Onset More than a month ago                             [facility] Adult PT Treatment/Exercise - 03/20/20 0001      Knee/Hip Exercises: Stretches   Gastroc Stretch Both;3 reps;30 seconds    Gastroc Stretch Limitations slant board      Knee/Hip Exercises: Banker walkouts 30# x 10      Knee/Hip Exercises: Standing   Heel Raises Both;1 set;20 reps    Hip Abduction Both;10  reps;2 sets    Abduction Limitations RTB at ankles    Lateral Step Up Both;2 sets;10 reps;Step Height: 4";Hand Hold: 2    Forward Step Up Both;2 sets;10 reps;Step Height: 4";Hand Hold: 2    Rocker Board 2 minutes    Rocker Board Limitations lateral    Other Standing Knee Exercises tandem stance 3 x 30" blue foam                  PT Education - 03/20/20 0926    Education Details Patient educated on HEP, exercise mechanics    Person(s) Educated Patient    Methods Explanation;Demonstration    Comprehension Verbalized understanding;Returned demonstration            PT Short Term Goals - 03/04/20 1140      PT SHORT TERM GOAL #1   Title Patient will be independent with HEP in order to improve functional outcomes.    Time 2    Period Weeks    Status New    Target Date 03/18/20      PT SHORT TERM GOAL #2   Title Pt to be able to single leg stance for 25" on both LE to reduce risk of falling    Time 2    Period Weeks    Status New     Target Date 03/18/20      PT SHORT TERM GOAL #3   Title Patient will report at least 25% improvement in symptoms for improved quality of life.    Time 2    Period Weeks    Status New    Target Date 03/18/20             PT Long Term Goals - 03/04/20 1141      PT LONG TERM GOAL #1   Title Patient will be able to perform stand x 5 in < 15 seconds to demonstrate improvement in functional mobility and reduced risk for falls.    Time 4    Period Weeks    Status New    Target Date 04/01/20      PT LONG TERM GOAL #2   Title Patient will report at least 75% improvement in symptoms for improved quality of life.    Time 4    Period Weeks    Status New    Target Date 04/01/20      PT LONG TERM GOAL #3   Title Patient will be able to ambulate at least 400 feet in 2MWT in order to demonstrate improved gait speed for community ambulation.    Time 4    Period Weeks    Status New    Target Date 04/01/20                 Plan - 03/20/20 6789    Clinical Impression Statement Patient requires heavy cueing for limiting L knee hyperextension with step up and for knee flexion with step down due to tendency to lock knee in hyperextension and utilize hip ROM for decent secondary to impaired quad strength and motor control.  Added resistance to standing hip abduction for improved proximal strength progression. Patient requires intermittent HHA with static balance on foam secondary to impaired balance.  Patient will continue to benefit from skilled physical therapy in order to reduce impairment and improve function.    Personal Factors and Comorbidities Age;Fitness;Behavior Pattern;Past/Current Experience;Comorbidity 3+;Time since onset of injury/illness/exacerbation    Comorbidities anxiety, DM, HTN    Examination-Activity Limitations Locomotion Level;Transfers;Squat;Stairs;Stand;Lift;Bend  Examination-Participation Restrictions Cleaning;Occupation;Meal Prep;Church;Community  Activity;Shop;Volunteer;Yard Work    Stability/Clinical Decision Making Stable/Uncomplicated    Rehab Potential Fair    PT Frequency 2x / week    PT Duration 4 weeks    PT Treatment/Interventions ADLs/Self Care Home Management;Aquatic Therapy;Cryotherapy;Electrical Stimulation;Iontophoresis 4mg /ml Dexamethasone;Moist Heat;Traction;Ultrasound;DME Instruction;Gait training;Stair training;Functional mobility training;Therapeutic activities;Therapeutic exercise;Balance training;Neuromuscular re-education;Patient/family education;Manual techniques;Manual lymph drainage;Compression bandaging;Dry needling;Energy conservation;Spinal Manipulations;Joint Manipulations;Splinting;Taping    PT Next Visit Plan Conitnue to progress funcitonal knee strength and balance as able. add to dynamic balance and add resistance to standing LE strengthening,    PT Home Exercise Plan currently performing :STS, tandem stance, SLS; 03/06/20: heel/toe raises, quad set, SLR, bridge, STS 2/2 single leg balance with support    Consulted and Agree with Plan of Care Patient           Patient will benefit from skilled therapeutic intervention in order to improve the following deficits and impairments:  Abnormal gait,Decreased endurance,Decreased activity tolerance,Pain,Decreased balance,Impaired flexibility,Improper body mechanics,Decreased strength,Decreased mobility  Visit Diagnosis: Muscle weakness (generalized)  History of falling  Other abnormalities of gait and mobility     Problem List Patient Active Problem List   Diagnosis Date Noted  . Polyneuropathy due to secondary diabetes mellitus ([place]) 05/25/2019  . Arthritis of right sacroiliac joint 05/25/2019  . Cervical disc disorder 05/25/2019  . Falls 05/25/2019  . General unsteadiness 05/25/2019  . Encounter for well woman exam with routine gynecological exam 01/09/2019  . Screening for colorectal cancer 01/09/2019  . Encounter for screening colonoscopy  12/13/2017  . Hyperparathyroidism, primary ([place]) 12/22/2016  . Vitamin D deficiency 10/31/2015  . Hyperuricemia 05/08/2013  . ONYCHOMYCOSIS, TOENAILS 02/22/2009  . NEVI, MULTIPLE 02/22/2009  . Diabetes mellitus ([place]) 01/25/2008  . BACK PAIN WITH RADICULOPATHY 11/09/2007  . Hyperlipidemia LDL goal <100 08/26/2007  . Morbid obesity ([place]) 08/26/2007  . Essential hypertension 08/26/2007  . Obstructive sleep apnea 04/20/2007    10:10 AM, 03/20/20 Mearl Latin PT, DPT Physical Therapist at [place], [name], [address] Phone: [phone]   Fax:  [phone]  Name: Pamela Stein MRN: [id number] Date of Birth: April 29, 1955

## 2020-03-21 ENCOUNTER — Encounter: Payer: Self-pay | Admitting: Orthopedic Surgery

## 2020-03-21 ENCOUNTER — Ambulatory Visit (INDEPENDENT_AMBULATORY_CARE_PROVIDER_SITE_OTHER): Payer: Federal, State, Local not specified - PPO | Admitting: Orthopedic Surgery

## 2020-03-21 VITALS — Ht 62.5 in | Wt 225.0 lb

## 2020-03-21 DIAGNOSIS — M25562 Pain in left knee: Secondary | ICD-10-CM

## 2020-03-21 DIAGNOSIS — G8929 Other chronic pain: Secondary | ICD-10-CM

## 2020-03-21 DIAGNOSIS — M1710 Unilateral primary osteoarthritis, unspecified knee: Secondary | ICD-10-CM

## 2020-03-21 DIAGNOSIS — M1712 Unilateral primary osteoarthritis, left knee: Secondary | ICD-10-CM

## 2020-03-21 NOTE — Patient Instructions (Signed)
M,T Pamela Stein

## 2020-03-21 NOTE — Progress Notes (Signed)
Chief Complaint  Patient presents with  . Injections    Supartz #2 Lt knee     Procedure note for injection of hyaluronic acid   Diagnosis osteoarthritis of the knee  Verbal consent was obtained to inject the knee with HYALURONIC ACID . Timeout was completed to confirm the injection site as the left   Knee  Ethyl chloride spray was used for anesthesia Alcohol was used to prep the skin. The infrapatellar lateral portal was used as an injection site and 1 vial of hyaluronic acid  was injected into the knee  Specific Co. Preparation: SUPARTZ  [id number]  No complications were noted  Encounter Diagnoses  Name Primary?  . Primary localized osteoarthritis of knee Yes  . Chronic pain of left knee

## 2020-03-25 ENCOUNTER — Encounter (HOSPITAL_COMMUNITY): Payer: Self-pay

## 2020-03-25 ENCOUNTER — Other Ambulatory Visit: Payer: Self-pay

## 2020-03-25 ENCOUNTER — Ambulatory Visit (HOSPITAL_COMMUNITY): Payer: Federal, State, Local not specified - PPO | Admitting: Physical Therapy

## 2020-03-25 ENCOUNTER — Ambulatory Visit (HOSPITAL_COMMUNITY): Payer: Federal, State, Local not specified - PPO

## 2020-03-25 DIAGNOSIS — R2689 Other abnormalities of gait and mobility: Secondary | ICD-10-CM

## 2020-03-25 DIAGNOSIS — Z9181 History of falling: Secondary | ICD-10-CM

## 2020-03-25 DIAGNOSIS — M6281 Muscle weakness (generalized): Secondary | ICD-10-CM | POA: Diagnosis not present

## 2020-03-25 NOTE — Therapy (Signed)
[place], [name], [address] Phone: [phone]   Fax:  [phone]  Physical Therapy Treatment  Patient Details  Name: Pamela Stein MRN: [id number] Date of Birth: 1955-09-01 Referring Provider (PT): Phillips Odor MD   Encounter Date: 03/25/2020   PT End of Session - 03/25/20 1314    Visit Number 7    Number of Visits 8    Date for PT Re-Evaluation 04/01/20    Authorization Type BCBS (no auth, VL 50- 0 used)    Authorization - Visit Number 7    Authorization - Number of Visits 50    Progress Note Due on Visit 10    PT Start Time 1306    PT Stop Time 1351    PT Time Calculation (min) 45 min    Activity Tolerance Patient tolerated treatment well    Behavior During Therapy [facility] for tasks assessed/performed           Past Medical History:  Diagnosis Date  . Anxiety   . Back pain   . Bronchitis   . Complication of anesthesia    stopped breathing after endometrial ablation procedure prior to her being diagn. with OSA  . Diabetes mellitus   . Dizziness   . Heart murmur   . History of gout   . Hyperlipidemia   . Hypertension   . Neuropathy   . Obesity   . Obstructive sleep apnea    CPAP    Past Surgical History:  Procedure Laterality Date  . ABDOMINAL HYSTERECTOMY    . ADENOIDECTOMY    . CATARACT EXTRACTION Left    right 02/2018  . COLONOSCOPY  2008   diminutive rectal polyp, s/p removal. polypoid mucosa  . COLONOSCOPY WITH PROPOFOL N/A 01/03/2018   Procedure: COLONOSCOPY WITH PROPOFOL;  Surgeon: Daneil Dolin, MD;  [facility];  Service: Endoscopy;  Laterality: N/A;  12:00pm  . DILATION AND CURETTAGE OF UTERUS    . ENDOMETRIAL ABLATION    . EYE SURGERY Left 12/04/2013   cataract  . PARATHYROIDECTOMY N/A 12/22/2016   Procedure: PARATHYROIDECTOMY, NECK EXPLORATION;  Surgeon: Jackolyn Confer, MD;  Location: [facility];  Service: General;  Laterality: N/A;  . TONSILLECTOMY       There were no vitals filed for this visit.   Subjective Assessment - 03/25/20 1313    Subjective Patient reports her knee injections are going well and she returns to ortho clinic again Thursday for additional HLA injection to left knee    Limitations Walking;Standing;House hold activities    How long can you walk comfortably? 15 minutes    Patient Stated Goals stabilize her knee some    Pain Onset More than a month ago              [facility] PT Assessment - 03/25/20 0001      Assessment   Medical Diagnosis Repeated Falls    Referring Provider (PT) Phillips Odor MD                         [facility] Adult PT Treatment/Exercise - 03/25/20 0001      Knee/Hip Exercises: Aerobic   Recumbent Bike level 3 x 5 min      Knee/Hip Exercises: Standing   Heel Raises Both;1 set;20 reps    Lateral Step Up Both;2 sets;10 reps;Hand Hold: 2;Step Height: 6"    Forward Step Up Both;2 sets;10 reps;Hand Hold: 2;Step Height: 6"  Rocker Board 4 minutes   2 min ant-post, 2 min lateral     Knee/Hip Exercises: Seated   Long Arc Quad Strengthening;Both;3 sets;10 reps      Knee/Hip Exercises: Supine   Quad Sets Strengthening;Left;2 sets;10 reps    Bridges 10 reps    Straight Leg Raises Strengthening;Left;2 sets;10 reps                    PT Short Term Goals - 03/04/20 1140      PT SHORT TERM GOAL #1   Title Patient will be independent with HEP in order to improve functional outcomes.    Time 2    Period Weeks    Status New    Target Date 03/18/20      PT SHORT TERM GOAL #2   Title Pt to be able to single leg stance for 25" on both LE to reduce risk of falling    Time 2    Period Weeks    Status New    Target Date 03/18/20      PT SHORT TERM GOAL #3   Title Patient will report at least 25% improvement in symptoms for improved quality of life.    Time 2    Period Weeks    Status New    Target Date 03/18/20             PT Long Term Goals - 03/04/20 1141       PT LONG TERM GOAL #1   Title Patient will be able to perform stand x 5 in < 15 seconds to demonstrate improvement in functional mobility and reduced risk for falls.    Time 4    Period Weeks    Status New    Target Date 04/01/20      PT LONG TERM GOAL #2   Title Patient will report at least 75% improvement in symptoms for improved quality of life.    Time 4    Period Weeks    Status New    Target Date 04/01/20      PT LONG TERM GOAL #3   Title Patient will be able to ambulate at least 400 feet in 2MWT in order to demonstrate improved gait speed for community ambulation.    Time 4    Period Weeks    Status New    Target Date 04/01/20                 Plan - 03/25/20 1354    Clinical Impression Statement Demonstrates improved activity tolerance as evidenced by increase in resistance and repetition for HEP additions.  Demonstrates some instances of unsteadiness during ambulation with left-bias LOB observed requiring UE support to recover.  Poor eccentric control appreciated when descending steps.  Exhibits difficulty maintaining BOS during static standing on compliant surfaces.    Personal Factors and Comorbidities Age;Fitness;Behavior Pattern;Past/Current Experience;Comorbidity 3+;Time since onset of injury/illness/exacerbation    Comorbidities anxiety, DM, HTN    Examination-Activity Limitations Locomotion Level;Transfers;Squat;Stairs;Stand;Lift;Bend    Examination-Participation Restrictions Cleaning;Occupation;Meal Prep;Church;Community Activity;Shop;Volunteer;Yard Work    Stability/Clinical Decision Making Stable/Uncomplicated    Rehab Potential Fair    PT Frequency 2x / week    PT Duration 4 weeks    PT Treatment/Interventions ADLs/Self Care Home Management;Aquatic Therapy;Cryotherapy;Electrical Stimulation;Iontophoresis 79m/ml Dexamethasone;Moist Heat;Traction;Ultrasound;DME Instruction;Gait training;Stair training;Functional mobility training;Therapeutic  activities;Therapeutic exercise;Balance training;Neuromuscular re-education;Patient/family education;Manual techniques;Manual lymph drainage;Compression bandaging;Dry needling;Energy conservation;Spinal Manipulations;Joint Manipulations;Splinting;Taping    PT Next Visit Plan Conitnue to progress funcitonal knee strength  and balance as able. add to dynamic balance and add resistance to standing LE strengthening,    PT Home Exercise Plan currently performing :STS, tandem stance, SLS; 03/06/20: heel/toe raises, quad set, SLR, bridge, STS 2/2 single leg balance with support    Consulted and Agree with Plan of Care Patient           Patient will benefit from skilled therapeutic intervention in order to improve the following deficits and impairments:  Abnormal gait,Decreased endurance,Decreased activity tolerance,Pain,Decreased balance,Impaired flexibility,Improper body mechanics,Decreased strength,Decreased mobility  Visit Diagnosis: Muscle weakness (generalized)  History of falling  Other abnormalities of gait and mobility     Problem List Patient Active Problem List   Diagnosis Date Noted  . Polyneuropathy due to secondary diabetes mellitus ([place]) 05/25/2019  . Arthritis of right sacroiliac joint 05/25/2019  . Cervical disc disorder 05/25/2019  . Falls 05/25/2019  . General unsteadiness 05/25/2019  . Encounter for well woman exam with routine gynecological exam 01/09/2019  . Screening for colorectal cancer 01/09/2019  . Encounter for screening colonoscopy 12/13/2017  . Hyperparathyroidism, primary ([place]) 12/22/2016  . Vitamin D deficiency 10/31/2015  . Hyperuricemia 05/08/2013  . ONYCHOMYCOSIS, TOENAILS 02/22/2009  . NEVI, MULTIPLE 02/22/2009  . Diabetes mellitus ([place]) 01/25/2008  . BACK PAIN WITH RADICULOPATHY 11/09/2007  . Hyperlipidemia LDL goal <100 08/26/2007  . Morbid obesity ([place]) 08/26/2007  . Essential hypertension 08/26/2007  . Obstructive sleep apnea 04/20/2007     1:57 PM, 03/25/20 M. Sherlyn Lees, PT, DPT Physical Therapist- [place] Office Number: [phone]  [facility] [address] [place], [name], [address] Phone: [phone]   Fax:  [phone]  Name: RADLEY TESTON MRN: [id number] Date of Birth: 08-Jun-1955

## 2020-03-27 ENCOUNTER — Ambulatory Visit (HOSPITAL_COMMUNITY): Payer: Federal, State, Local not specified - PPO | Admitting: Physical Therapy

## 2020-03-27 ENCOUNTER — Encounter (HOSPITAL_COMMUNITY): Payer: Self-pay | Admitting: Physical Therapy

## 2020-03-27 ENCOUNTER — Other Ambulatory Visit: Payer: Self-pay

## 2020-03-27 DIAGNOSIS — M6281 Muscle weakness (generalized): Secondary | ICD-10-CM

## 2020-03-27 DIAGNOSIS — Z9181 History of falling: Secondary | ICD-10-CM | POA: Diagnosis not present

## 2020-03-27 DIAGNOSIS — R2689 Other abnormalities of gait and mobility: Secondary | ICD-10-CM | POA: Diagnosis not present

## 2020-03-28 ENCOUNTER — Encounter: Payer: Self-pay | Admitting: Orthopedic Surgery

## 2020-03-28 ENCOUNTER — Ambulatory Visit (INDEPENDENT_AMBULATORY_CARE_PROVIDER_SITE_OTHER): Payer: Federal, State, Local not specified - PPO | Admitting: Orthopedic Surgery

## 2020-03-28 DIAGNOSIS — M1712 Unilateral primary osteoarthritis, left knee: Secondary | ICD-10-CM | POA: Diagnosis not present

## 2020-03-28 DIAGNOSIS — M25562 Pain in left knee: Secondary | ICD-10-CM

## 2020-03-28 DIAGNOSIS — G8929 Other chronic pain: Secondary | ICD-10-CM

## 2020-03-28 DIAGNOSIS — M1710 Unilateral primary osteoarthritis, unspecified knee: Secondary | ICD-10-CM

## 2020-03-28 NOTE — Progress Notes (Signed)
Chief Complaint  Patient presents with  . Knee Pain    Left knee     Encounter Diagnoses  Name Primary?  . Primary localized osteoarthritis of knee Yes  . Chronic pain of left knee     Third Supartz injection left knee  Procedure note for injection of hyaluronic acid   Diagnosis osteoarthritis of the knee  Verbal consent was obtained to inject the knee with HYALURONIC ACID . Timeout was completed to confirm the injection site as the left    Knee  Ethyl chloride spray was used for anesthesia Alcohol was used to prep the skin. The infrapatellar lateral portal was used as an injection site and 1 vial of hyaluronic acid  was injected into the knee  Specific Co. Preparation: supartz   No complications were noted  She will fill out FMLA papers for [place] to fill out to be off 48 hours anytime within 3 weeks as needed for chronic osteoarthritis pain left knee

## 2020-03-28 NOTE — Therapy (Signed)
[facility] [address] [place], [place], [address] Phone: [phone]   Fax:  [phone]  Physical Therapy Treatment  Patient Details  Name: Pamela Stein MRN: [id number] Date of Birth: 03/15/1955 Referring Provider (PT): Kofi Doonquah MD  PHYSICAL THERAPY DISCHARGE SUMMARY  Visits from Start of Care: 8  Current functional level related to goals / functional outcomes: See below    Remaining deficits: See below    Education / Equipment: See assessment  Plan: Patient agrees to discharge.  Patient goals were partially met. Patient is being discharged due to being pleased with the current functional level.  ?????       Encounter Date: 03/27/2020   PT End of Session - 03/27/20 1050    Visit Number 8    Number of Visits 8    Date for PT Re-Evaluation 04/01/20    Authorization Type BCBS (no auth, VL 50- 0 used)    Authorization - Visit Number 8    Authorization - Number of Visits 50    Progress Note Due on Visit 10    PT Start Time 1040    PT Stop Time 1120    PT Time Calculation (min) 40 min    Activity Tolerance Patient tolerated treatment well    Behavior During Therapy WFL for tasks assessed/performed           Past Medical History:  Diagnosis Date  . Anxiety   . Back pain   . Bronchitis   . Complication of anesthesia    stopped breathing after endometrial ablation procedure prior to her being diagn. with OSA  . Diabetes mellitus   . Dizziness   . Heart murmur   . History of gout   . Hyperlipidemia   . Hypertension   . Neuropathy   . Obesity   . Obstructive sleep apnea    CPAP    Past Surgical History:  Procedure Laterality Date  . ABDOMINAL HYSTERECTOMY    . ADENOIDECTOMY    . CATARACT EXTRACTION Left    right 02/2018  . COLONOSCOPY  2008   diminutive rectal polyp, s/p removal. polypoid mucosa  . COLONOSCOPY WITH PROPOFOL N/A 01/03/2018   Procedure: COLONOSCOPY WITH PROPOFOL;  Surgeon: Rourk,  Robert M, MD;  [facility];  Service: Endoscopy;  Laterality: N/A;  12:00pm  . DILATION AND CURETTAGE OF UTERUS    . ENDOMETRIAL ABLATION    . EYE SURGERY Left 12/04/2013   cataract  . PARATHYROIDECTOMY N/A 12/22/2016   Procedure: PARATHYROIDECTOMY, NECK EXPLORATION;  Surgeon: Rosenbower, Todd, MD;  [facility];  Service: General;  Laterality: N/A;  . TONSILLECTOMY      There were no vitals filed for this visit.   Subjective Assessment - 03/27/20 1047    Subjective Patient says its the "same ol same". She says she felt good after Monday, but still hurts. She still feels unsteady, has stumbles but no falls. She says she feels about 90% better.    Limitations Walking;Standing;House hold activities    How long can you walk comfortably? 15 minutes    Patient Stated Goals stabilize her knee some    Currently in Pain? Yes    Pain Score 2     Pain Location Knee    Pain Orientation Left    Pain Descriptors / Indicators Dull;Aching    Pain Type Chronic pain    Pain Onset More than a month ago    Pain Frequency Intermittent                 03/27/20 0001  Assessment  Medical Diagnosis Repeated Falls  Referring Provider (PT) Kofi Doonquah MD  Prior Therapy yes  Precautions  Precautions Fall  Restrictions  Weight Bearing Restrictions No  Balance Screen  Has the patient fallen in the past 6 months No  Home Environment  Living Environment Private residence  Prior Function  Level of Independence Independent  Vocation Full time employment  Cognition  Overall Cognitive Status Within Functional Limits for tasks assessed  Transfers  Five time sit to stand comments  12.5 sec with no UE (was 18 sec)  Ambulation/Gait  Ambulation/Gait Yes  Ambulation Distance (Feet) 400 Feet (305 feet)  Assistive device None  Gait Pattern Decreased stance time - left  Ambulation Surface Level;Indoor  Stairs Yes  Stairs Assistance 6: Modified independent (Device/Increase time)   Stair Management Technique One rail Left;Alternating pattern (unable to ascend on LLE first)  Number of Stairs 8  Height of Stairs 7  Gait Comments 2MWT      PT Education - 03/28/20 1224    Education Details on reassessment findings, progress to goals and transition to HEP for DC    Person(s) Educated Patient    Methods Explanation    Comprehension Verbalized understanding            PT Short Term Goals - 03/28/20 1215      PT SHORT TERM GOAL #1   Title Patient will be independent with HEP in order to improve functional outcomes.    Baseline Reports compliance    Time 2    Period Weeks    Status Achieved    Target Date 03/18/20      PT SHORT TERM GOAL #2   Title Pt to be able to single leg stance for 25" on both LE to reduce risk of falling    Baseline Limited to pain    Time 2    Period Weeks    Status Not Met    Target Date 03/18/20      PT SHORT TERM GOAL #3   Title Patient will report at least 25% improvement in symptoms for improved quality of life.    Baseline Reports 90%    Time 2    Period Weeks    Status Achieved    Target Date 03/18/20             PT Long Term Goals - 03/28/20 1215      PT LONG TERM GOAL #1   Title Patient will be able to perform stand x 5 in < 15 seconds to demonstrate improvement in functional mobility and reduced risk for falls.    Baseline Current 12.5 sec    Time 4    Period Weeks    Status Achieved      PT LONG TERM GOAL #2   Title Patient will report at least 75% improvement in symptoms for improved quality of life.    Baseline Reports 90%    Time 4    Period Weeks    Status Achieved      PT LONG TERM GOAL #3   Title Patient will be able to ambulate at least 400 feet in 2MWT in order to demonstrate improved gait speed for community ambulation.    Baseline Current 400 feet with no AD    Time 4    Period Weeks    Status Achieved                 Plan - 03/28/20 1217      Clinical Impression Statement  Patient has made good progress toward therapy goals and has currently met all goals except single leg balance. Patient shows improved activity tolerance functional strength and static balance but has difficulty with single limb stance due to knee pain. Patient also still with some trouble ascending stairs on LT leg first due to instability, but is able to descend without issue. This may be due to structural damage within knee and may be corrected with pending surgery. At this time patient will be DC from therapy with maximal benefit reached. She should follow up with therapy services in preparation for her pending knee surgery, once she is able to schedule it. Reviewed HEP and answered all patient questions. Patient encouraged to follow up with any further questions or concerns.    Personal Factors and Comorbidities Age;Fitness;Behavior Pattern;Past/Current Experience;Comorbidity 3+;Time since onset of injury/illness/exacerbation    Comorbidities anxiety, DM, HTN    Examination-Activity Limitations Locomotion Level;Transfers;Squat;Stairs;Stand;Lift;Bend    Examination-Participation Restrictions Cleaning;Occupation;Meal Prep;Church;Community Activity;Shop;Volunteer;Yard Work    Stability/Clinical Decision Making Stable/Uncomplicated    Rehab Potential Fair    PT Frequency 2x / week    PT Duration 4 weeks    PT Treatment/Interventions ADLs/Self Care Home Management;Aquatic Therapy;Cryotherapy;Electrical Stimulation;Iontophoresis 77m/ml Dexamethasone;Moist Heat;Traction;Ultrasound;DME Instruction;Gait training;Stair training;Functional mobility training;Therapeutic activities;Therapeutic exercise;Balance training;Neuromuscular re-education;Patient/family education;Manual techniques;Manual lymph drainage;Compression bandaging;Dry needling;Energy conservation;Spinal Manipulations;Joint Manipulations;Splinting;Taping    PT Next Visit Plan DC to HEP    PT Home Exercise Plan currently performing :STS, tandem  stance, SLS; 03/06/20: heel/toe raises, quad set, SLR, bridge, STS 2/2 single leg balance with support    Consulted and Agree with Plan of Care Patient           Patient will benefit from skilled therapeutic intervention in order to improve the following deficits and impairments:  Abnormal gait,Decreased endurance,Decreased activity tolerance,Pain,Decreased balance,Impaired flexibility,Improper body mechanics,Decreased strength,Decreased mobility  Visit Diagnosis: Muscle weakness (generalized)  History of falling  Other abnormalities of gait and mobility     Problem List Patient Active Problem List   Diagnosis Date Noted  . Polyneuropathy due to secondary diabetes mellitus [place] 05/25/2019  . Arthritis of right sacroiliac joint 05/25/2019  . Cervical disc disorder 05/25/2019  . Falls 05/25/2019  . General unsteadiness 05/25/2019  . Encounter for well woman exam with routine gynecological exam 01/09/2019  . Screening for colorectal cancer 01/09/2019  . Encounter for screening colonoscopy 12/13/2017  . Hyperparathyroidism, primary ([place] 12/22/2016  . Vitamin D deficiency 10/31/2015  . Hyperuricemia 05/08/2013  . ONYCHOMYCOSIS, TOENAILS 02/22/2009  . NEVI, MULTIPLE 02/22/2009  . Diabetes mellitus ([place] 01/25/2008  . BACK PAIN WITH RADICULOPATHY 11/09/2007  . Hyperlipidemia LDL goal <100 08/26/2007  . Morbid obesity ([place] 08/26/2007  . Essential hypertension 08/26/2007  . Obstructive sleep apnea 04/20/2007   12:27 PM, 03/28/20 CJosue HectorPT DPT  [facility] [phone]SS[address] [place] [address]Phone: [phone]  Fax:  [phone] Name: Pamela GALEASMRN: [id number] of Birth: [birth year]

## 2020-04-01 ENCOUNTER — Ambulatory Visit (HOSPITAL_COMMUNITY): Payer: Federal, State, Local not specified - PPO | Admitting: Physical Therapy

## 2020-05-08 ENCOUNTER — Telehealth: Payer: Self-pay | Admitting: Orthopedic Surgery

## 2020-05-08 ENCOUNTER — Other Ambulatory Visit: Payer: Self-pay | Admitting: Radiology

## 2020-05-08 NOTE — Telephone Encounter (Signed)
She is ready to proceed posted for April 19th

## 2020-05-08 NOTE — Telephone Encounter (Signed)
Yes can schedule now last time she wanted to see if injections helped

## 2020-05-08 NOTE — Telephone Encounter (Signed)
Patient came by office asking about status of surgery for total knee replacement. She said she has been waiting for a call based on hospital cases previously on hold due to covid. We have discussed forms for FMLA; patient aware.

## 2020-05-10 ENCOUNTER — Other Ambulatory Visit (HOSPITAL_COMMUNITY): Payer: Self-pay | Admitting: Internal Medicine

## 2020-05-10 DIAGNOSIS — Z1231 Encounter for screening mammogram for malignant neoplasm of breast: Secondary | ICD-10-CM

## 2020-05-16 ENCOUNTER — Telehealth: Payer: Self-pay | Admitting: Radiology

## 2020-05-16 ENCOUNTER — Other Ambulatory Visit: Payer: Self-pay | Admitting: Orthopedic Surgery

## 2020-05-16 ENCOUNTER — Encounter: Payer: Self-pay | Admitting: Orthopedic Surgery

## 2020-05-16 DIAGNOSIS — M1710 Unilateral primary osteoarthritis, unspecified knee: Secondary | ICD-10-CM

## 2020-05-16 NOTE — Telephone Encounter (Signed)
Phone call to [employer] states no authorization required for OP observation stay and TKR [phone] CPT code ref number for the call is her name and todays date

## 2020-05-17 ENCOUNTER — Ambulatory Visit (HOSPITAL_COMMUNITY): Payer: Federal, State, Local not specified - PPO

## 2020-05-17 NOTE — Patient Instructions (Addendum)
Your procedure is scheduled on: 05/28/2020  Report to [facility] at  6:15   AM.  Call this number if you have problems the morning of surgery: [phone]   Remember:   Do not Eat or Drink after midnight         No Smoking the morning of surgery  :  Take these medicines the morning of surgery with A SIP OF WATER: Lexapro and Gabapentin if needed              No diabetic medication morning of surgery.   Do not wear jewelry, make-up or nail polish.  Do not wear lotions, powders, or perfumes. You may wear deodorant.  Do not shave 48 hours prior to surgery. Men may shave face and neck.  Do not bring valuables to the hospital.  Contacts, dentures or bridgework may not be worn into surgery.  Leave suitcase in the car. After surgery it may be brought to your room.  For patients admitted to the hospital, checkout time is 11:00 AM the day of discharge.   Patients discharged the day of surgery will not be allowed to drive home.    Special Instructions: Shower using CHG night before surgery and shower the day of surgery use CHG.  Use special wash - you have one bottle of CHG for all showers.  You should use approximately 1/2 of the bottle for each shower.  How to Use Chlorhexidine for Bathing Chlorhexidine gluconate (CHG) is a germ-killing (antiseptic) solution that is used to clean the skin. It can get rid of the bacteria that normally live on the skin and can keep them away for about 24 hours. To clean your skin with CHG, you may be given:  A CHG solution to use in the shower or as part of a sponge bath.  A prepackaged cloth that contains CHG. Cleaning your skin with CHG may help lower the risk for infection:  While you are staying in the intensive care unit of the hospital.  If you have a vascular access, such as a central line, to provide short-term or long-term access to your veins.  If you have a catheter to drain urine from your bladder.  If you are on a ventilator. A  ventilator is a machine that helps you breathe by moving air in and out of your lungs.  After surgery. What are the risks? Risks of using CHG include:  A skin reaction.  Hearing loss, if CHG gets in your ears.  Eye injury, if CHG gets in your eyes and is not rinsed out.  The CHG product catching fire. Make sure that you avoid smoking and flames after applying CHG to your skin. Do not use CHG:  If you have a chlorhexidine allergy or have previously reacted to chlorhexidine.  On babies younger than 61 months of age. How to use CHG solution  Use CHG only as told by your health care provider, and follow the instructions on the label.  Use the full amount of CHG as directed. Usually, this is one bottle. During a shower Follow these steps when using CHG solution during a shower (unless your health care provider gives you different instructions): 1. Start the shower. 2. Use your normal soap and shampoo to wash your face and hair. 3. Turn off the shower or move out of the shower stream. 4. Pour the CHG onto a clean washcloth. Do not use any type of brush or rough-edged sponge. 5. Starting at your neck,  lather your body down to your toes. Make sure you follow these instructions: ? If you will be having surgery, pay special attention to the part of your body where you will be having surgery. Scrub this area for at least 1 minute. ? Do not use CHG on your head or face. If the solution gets into your ears or eyes, rinse them well with water. ? Avoid your genital area. ? Avoid any areas of skin that have broken skin, cuts, or scrapes. ? Scrub your back and under your arms. Make sure to wash skin folds. 6. Let the lather sit on your skin for 1-2 minutes or as long as told by your health care provider. 7. Thoroughly rinse your entire body in the shower. Make sure that all body creases and crevices are rinsed well. 8. Dry off with a clean towel. Do not put any substances on your body  afterward--such as powder, lotion, or perfume--unless you are told to do so by your health care provider. Only use lotions that are recommended by the manufacturer. 9. Put on clean clothes or pajamas. 10. If it is the night before your surgery, sleep in clean sheets.   During a sponge bath Follow these steps when using CHG solution during a sponge bath (unless your health care provider gives you different instructions): 1. Use your normal soap and shampoo to wash your face and hair. 2. Pour the CHG onto a clean washcloth. 3. Starting at your neck, lather your body down to your toes. Make sure you follow these instructions: ? If you will be having surgery, pay special attention to the part of your body where you will be having surgery. Scrub this area for at least 1 minute. ? Do not use CHG on your head or face. If the solution gets into your ears or eyes, rinse them well with water. ? Avoid your genital area. ? Avoid any areas of skin that have broken skin, cuts, or scrapes. ? Scrub your back and under your arms. Make sure to wash skin folds. 4. Let the lather sit on your skin for 1-2 minutes or as long as told by your health care provider. 5. Using a different clean, wet washcloth, thoroughly rinse your entire body. Make sure that all body creases and crevices are rinsed well. 6. Dry off with a clean towel. Do not put any substances on your body afterward--such as powder, lotion, or perfume--unless you are told to do so by your health care provider. Only use lotions that are recommended by the manufacturer. 7. Put on clean clothes or pajamas. 8. If it is the night before your surgery, sleep in clean sheets. How to use CHG prepackaged cloths  Only use CHG cloths as told by your health care provider, and follow the instructions on the label.  Use the CHG cloth on clean, dry skin.  Do not use the CHG cloth on your head or face unless your health care provider tells you to.  When washing with  the CHG cloth: ? Avoid your genital area. ? Avoid any areas of skin that have broken skin, cuts, or scrapes. Before surgery Follow these steps when using a CHG cloth to clean before surgery (unless your health care provider gives you different instructions): 1. Using the CHG cloth, vigorously scrub the part of your body where you will be having surgery. Scrub using a back-and-forth motion for 3 minutes. The area on your body should be completely wet with CHG when you  are done scrubbing. 2. Do not rinse. Discard the cloth and let the area air-dry. Do not put any substances on the area afterward, such as powder, lotion, or perfume. 3. Put on clean clothes or pajamas. 4. If it is the night before your surgery, sleep in clean sheets.   For general bathing Follow these steps when using CHG cloths for general bathing (unless your health care provider gives you different instructions). 1. Use a separate CHG cloth for each area of your body. Make sure you wash between any folds of skin and between your fingers and toes. Wash your body in the following order, switching to a new cloth after each step: ? The front of your neck, shoulders, and chest. ? Both of your arms, under your arms, and your hands. ? Your stomach and groin area, avoiding the genitals. ? Your right leg and foot. ? Your left leg and foot. ? The back of your neck, your back, and your buttocks. 2. Do not rinse. Discard the cloth and let the area air-dry. Do not put any substances on your body afterward--such as powder, lotion, or perfume--unless you are told to do so by your health care provider. Only use lotions that are recommended by the manufacturer. 3. Put on clean clothes or pajamas. Contact a health care provider if:  Your skin gets irritated after scrubbing.  You have questions about using your solution or cloth. Get help right away if:  Your eyes become very red or swollen.  Your eyes itch badly.  Your skin itches badly  and is red or swollen.  Your hearing changes.  You have trouble seeing.  You have swelling or tingling in your mouth or throat.  You have trouble breathing.  You swallow any chlorhexidine. Summary  Chlorhexidine gluconate (CHG) is a germ-killing (antiseptic) solution that is used to clean the skin. Cleaning your skin with CHG may help to lower your risk for infection.  You may be given CHG to use for bathing. It may be in a bottle or in a prepackaged cloth to use on your skin. Carefully follow your health care provider's instructions and the instructions on the product label.  Do not use CHG if you have a chlorhexidine allergy.  Contact your health care provider if your skin gets irritated after scrubbing. This information is not intended to replace advice given to you by your health care provider. Make sure you discuss any questions you have with your health care provider. Document Revised: 07/14/2019 Document Reviewed: 07/14/2019 Elsevier Patient Education  2021 [employer].  Total Knee Replacement, Care After After the procedure, it is common to have stiffness and discomfort, or redness, pain, and swelling around your cut from surgery (incision). You may also have a small amount of blood or clear fluid coming from your incision. Follow these instructions at home: Your doctor may give you more instructions. If you have problems, contact your doctor. Medicines  Take over-the-counter and prescription medicines only as told by your doctor.  If you were prescribed a blood thinner, take it as told by your doctor.  If told, take steps to prevent problems with pooping (constipation). You may need to: ? Drink enough fluid to keep your pee (urine) pale yellow. ? Take medicines. You will be told what medicines to take. ? Eat foods that are high in fiber. These include beans, whole grains, and fresh fruits and vegetables. ? Limit foods that are high in fat and sugar. These include  fried or  sweet foods. Incision care  Follow instructions from your doctor about how to take care of your incision. Make sure you: ? Wash your hands with soap and water for at least 20 seconds before and after you change your bandage. If you cannot use soap and water, use hand sanitizer. ? Change your bandage. ? Leave stitches, staples, or skin glue in place for at least 2 weeks. ? Leave tape strips alone unless you are told to take them off. You may trim the edges of the tape strips if they curl up.  Do not take baths, swim, or use a hot tub until your doctor says it is okay.  Check your incision every day for signs of infection. Check for: ? More redness, swelling, or pain. ? More fluid or blood. ? Warmth. ? Pus or a bad smell.   Activity  Rest as told by your doctor.  Get up to take short walks every 1 to 2 hours. Ask for help if you feel weak or unsteady.  Follow instructions from your doctor about: ? Using a walker, crutches, or a cane. ? How much body weight you may safely support on your affected leg (weight-bearing restrictions). ? How to get out of a bed and chair and how to go up and down stairs. A physical therapist will show you how to do this.  Do exercises as told by your doctor or physical therapist.  Avoid activities that put stress on your knees. These include running, jumping rope, and doing jumping jacks.  Do not play contact sports until your doctor says it is okay.  Return to your normal activities when your doctor says that it is safe. Managing pain, stiffness, and swelling  If told, put ice on your knee. To do this: ? Put ice in a plastic bag or use an icing device (cold flow pad). Follow your doctor's directions about how to use the icing device. ? Place a towel between your skin and the bag or between your skin and the icing device. ? Leave the ice on for 20 minutes, 2-3 times a day. ? Take off the ice if your skin turns bright red. This is very  important. If you cannot feel pain, heat, or cold, you have a greater risk of damage to the area.  Move your toes often.  Raise your leg above the level of your heart while you are sitting or lying down. ? Use a few pillows to keep your leg straight. ? Do not put a pillow just under the knee. If the knee is bent for a long time, this may make the knee stiff.  Wear elastic knee support as told by your doctor.   Safety  To help prevent falls: ? Keep floors clear of objects you may trip over. ? Place items that you may need within easy reach.  Wear an apron or tool belt with pockets for carrying objects. This leaves your hands free to help with your balance.  Do not drive or use machines until your doctor says it is safe.   General instructions  Wear compression stockings as told by your doctor.  Continue with breathing exercises. This helps prevent lung infection.  Do not smoke or use any products that contain nicotine or tobacco. If you need help quitting, ask your doctor.  If you plan to visit a dentist: ? Tell your doctor. Ask about things to do before your teeth are cleaned. ? Tell your dentist about your new joint.  Keep all follow-up visits. Contact a doctor if:  You have a fever or chills.  You have a cough or feel short of breath.  Your medicine is not controlling your pain.  You have any of these signs of infection around your incision: ? More redness, swelling, or pain. ? More fluid or blood. ? Warmth. ? Pus or a bad smell.  You fall. Get help right away if:  You have very bad pain.  You have trouble breathing.  You have chest pain.  You have pain in your calf or leg.  You have redness, swelling, or warmth in your calf or leg.  Your incision breaks open after the stitches or staples are taken out. These symptoms may be an emergency. Get help right away. Call your local emergency services (911 in the [place]  Do not wait to see if the symptoms will  go away.  Do not drive yourself to the hospital. Summary  After the procedure, it is common to have stiffness and discomfort, or redness, pain, and swelling around your incision.  Follow instructions from your doctor about how to take care of your incision.  Use crutches, a walker, or a cane as told by your doctor.  If you were prescribed a blood thinner, take it as told by your doctor.  Keep all follow-up visits. This information is not intended to replace advice given to you by your health care provider. Make sure you discuss any questions you have with your health care provider. Document Revised: 07/18/2019 Document Reviewed: 07/18/2019 Elsevier Patient Education  2021 [place] Anesthesia, Adult, Care After This sheet gives you information about how to care for yourself after your procedure. Your health care provider may also give you more specific instructions. If you have problems or questions, contact your health care provider. What can I expect after the procedure? After the procedure, the following side effects are common:  Pain or discomfort at the IV site.  Nausea.  Vomiting.  Sore throat.  Trouble concentrating.  Feeling cold or chills.  Feeling weak or tired.  Sleepiness and fatigue.  Soreness and body aches. These side effects can affect parts of the body that were not involved in surgery. Follow these instructions at home: For the time period you were told by your health care provider:  Rest.  Do not participate in activities where you could fall or become injured.  Do not drive or use machinery.  Do not drink alcohol.  Do not take sleeping pills or medicines that cause drowsiness.  Do not make important decisions or sign legal documents.  Do not take care of children on your own.   Eating and drinking  Follow any instructions from your health care provider about eating or drinking restrictions.  When you feel hungry, start by  eating small amounts of foods that are soft and easy to digest (bland), such as toast. Gradually return to your regular diet.  Drink enough fluid to keep your urine pale yellow.  If you vomit, rehydrate by drinking water, juice, or clear broth. General instructions  If you have sleep apnea, surgery and certain medicines can increase your risk for breathing problems. Follow instructions from your health care provider about wearing your sleep device: ? Anytime you are sleeping, including during daytime naps. ? While taking prescription pain medicines, sleeping medicines, or medicines that make you drowsy.  Have a responsible adult stay with you for the time you are told. It is important to  have someone help care for you until you are awake and alert.  Return to your normal activities as told by your health care provider. Ask your health care provider what activities are safe for you.  Take over-the-counter and prescription medicines only as told by your health care provider.  If you smoke, do not smoke without supervision.  Keep all follow-up visits as told by your health care provider. This is important. Contact a health care provider if:  You have nausea or vomiting that does not get better with medicine.  You cannot eat or drink without vomiting.  You have pain that does not get better with medicine.  You are unable to pass urine.  You develop a skin rash.  You have a fever.  You have redness around your IV site that gets worse. Get help right away if:  You have difficulty breathing.  You have chest pain.  You have blood in your urine or stool, or you vomit blood. Summary  After the procedure, it is common to have a sore throat or nausea. It is also common to feel tired.  Have a responsible adult stay with you for the time you are told. It is important to have someone help care for you until you are awake and alert.  When you feel hungry, start by eating small amounts  of foods that are soft and easy to digest (bland), such as toast. Gradually return to your regular diet.  Drink enough fluid to keep your urine pale yellow.  Return to your normal activities as told by your health care provider. Ask your health care provider what activities are safe for you. This information is not intended to replace advice given to you by your health care provider. Make sure you discuss any questions you have with your health care provider. Document Revised: 10/12/2019 Document Reviewed: 05/11/2019 Elsevier Patient Education  2021 [employer].

## 2020-05-20 ENCOUNTER — Telehealth: Payer: Self-pay | Admitting: Orthopedic Surgery

## 2020-05-20 ENCOUNTER — Ambulatory Visit (HOSPITAL_COMMUNITY)
Admission: RE | Admit: 2020-05-20 | Discharge: 2020-05-20 | Disposition: A | Discharge status: Home / Self Care | Payer: Federal, State, Local not specified - PPO | Source: Ambulatory Visit | Attending: Internal Medicine | Admitting: Internal Medicine

## 2020-05-20 DIAGNOSIS — Z1231 Encounter for screening mammogram for malignant neoplasm of breast: Secondary | ICD-10-CM | POA: Insufficient documentation

## 2020-05-20 IMAGING — MG MM DIGITAL SCREENING BILAT W/ TOMO AND CAD
8 of 14 series · 8 of 40 positions shown · non-contrast
Comparison: Previous exam(s).

CLINICAL DATA: Screening.

EXAM:
DIGITAL SCREENING BILATERAL MAMMOGRAM WITH TOMOSYNTHESIS AND CAD
TECHNIQUE: Bilateral screening digital craniocaudal and mediolateral oblique
mammograms were obtained. Bilateral screening digital breast
tomosynthesis was performed. The images were evaluated with
computer-aided detection.

[L CC synth-2D (1 of 2)]
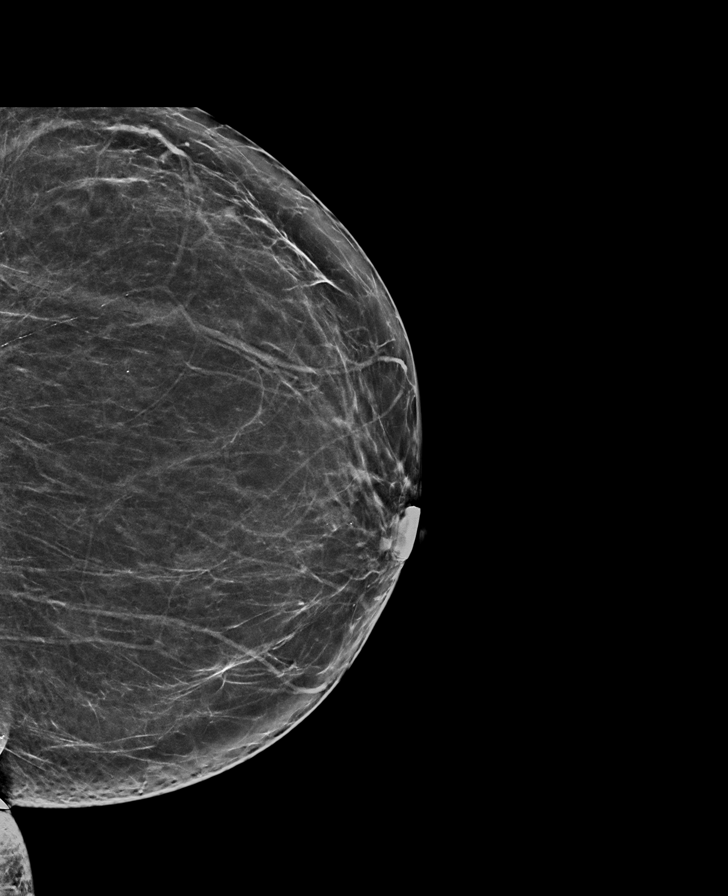

[R MLO synth-2D (1 of 2)]
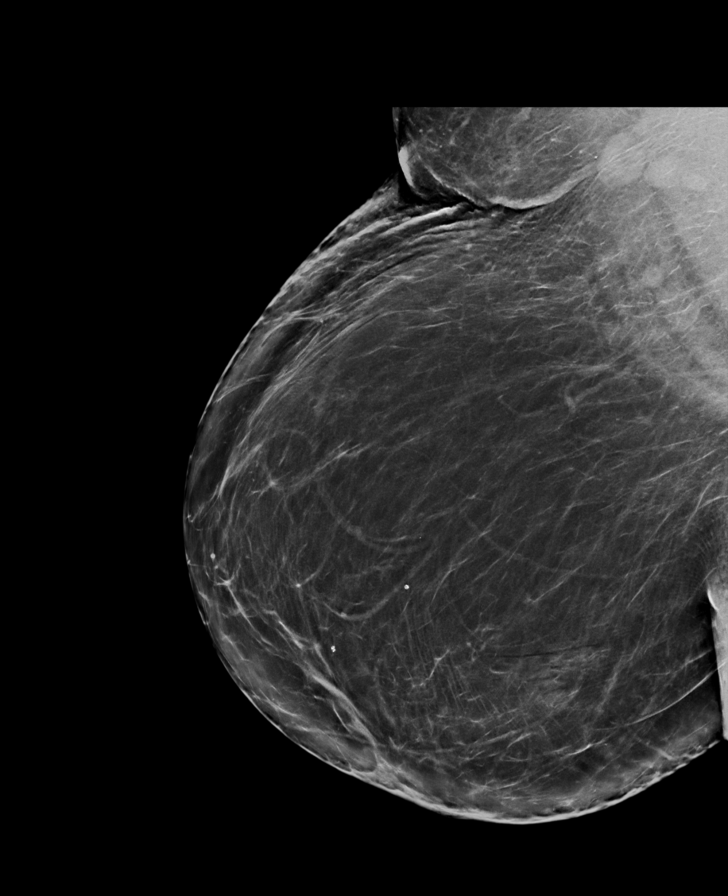

[L MLO synth-2D]
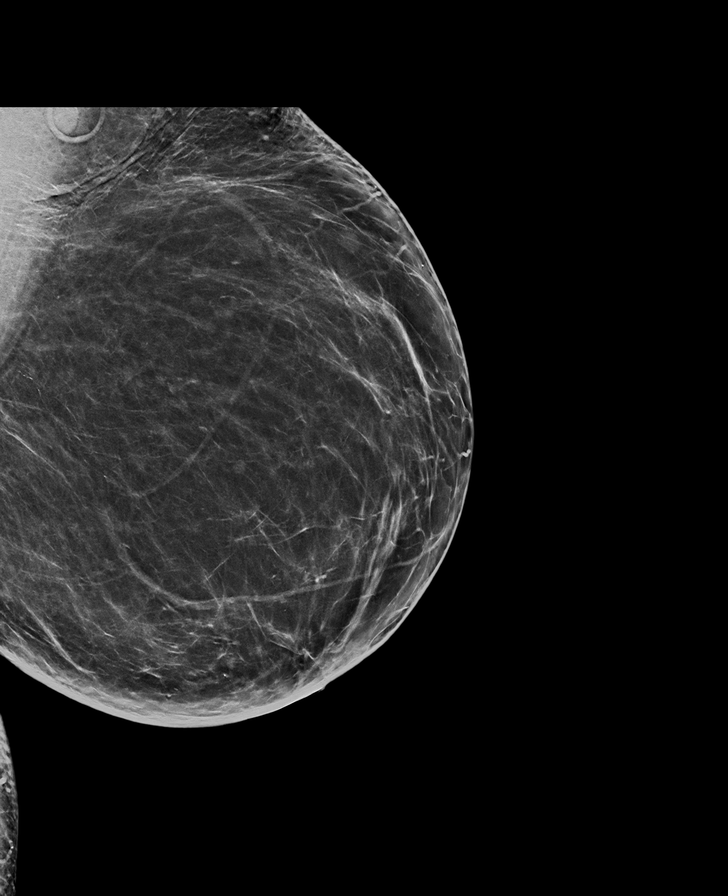

[L CC synth-2D (2 of 2)]
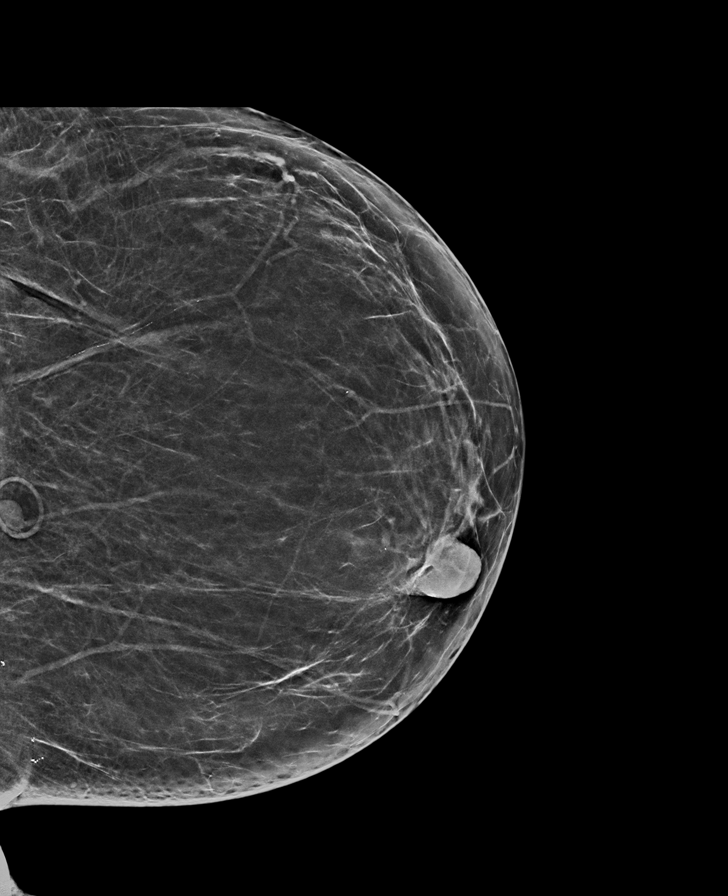

[R MLO synth-2D (2 of 2)]
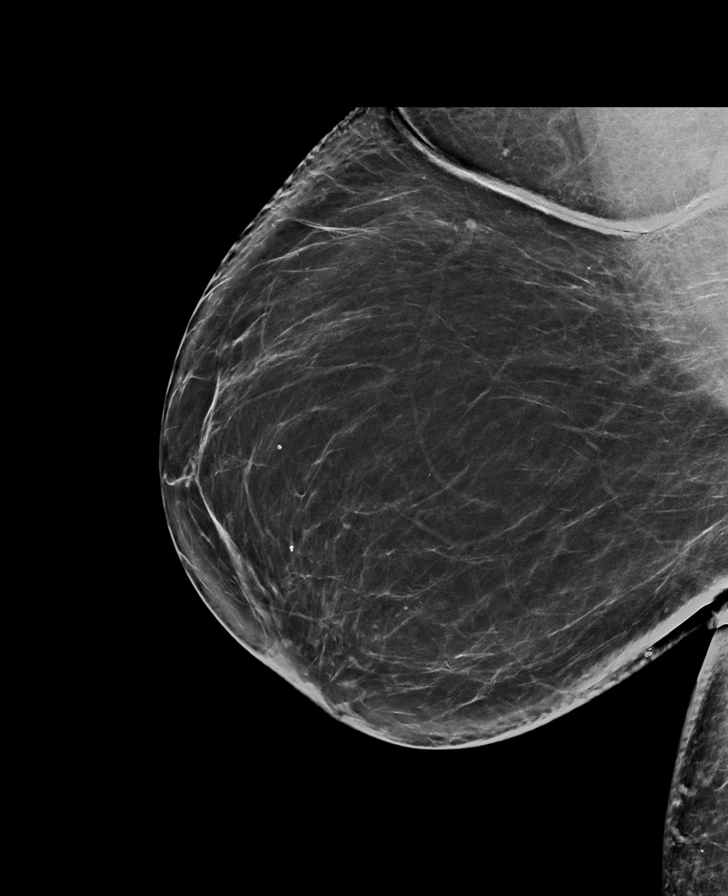

[R CC synth-2D (1 of 2)]
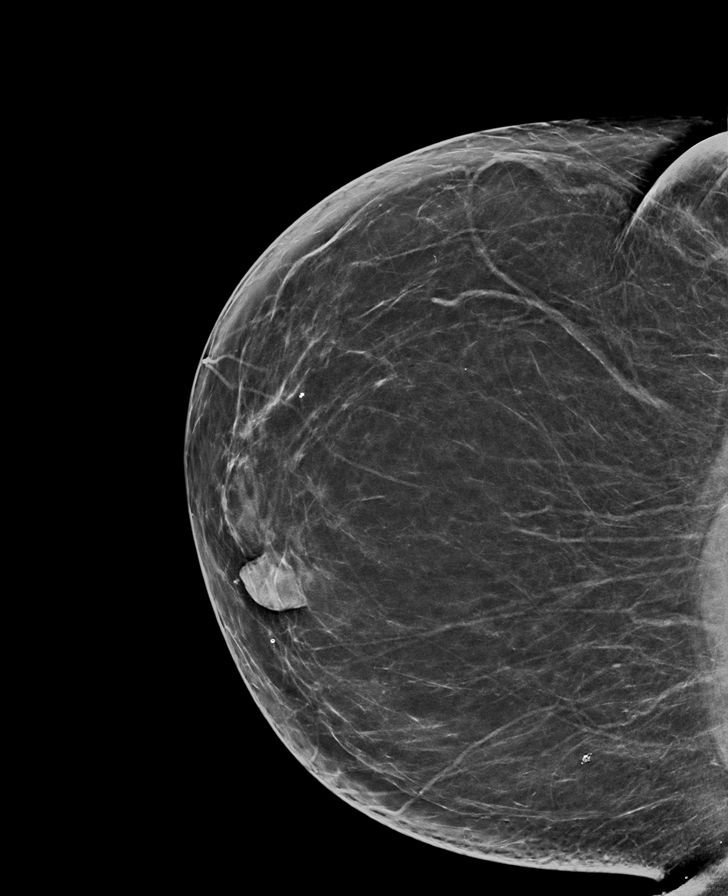

[R CC synth-2D (2 of 2)]
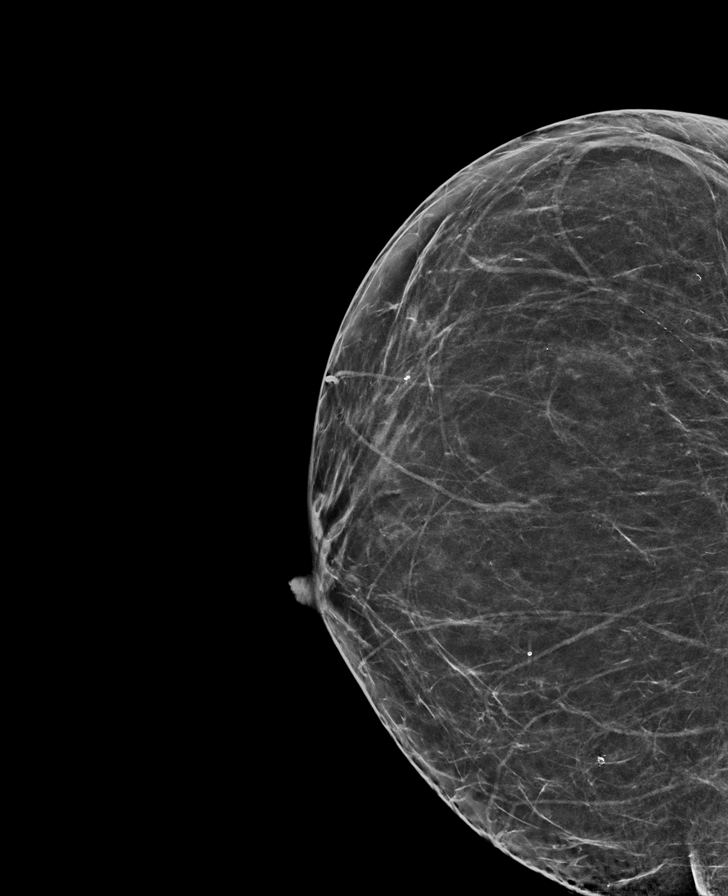

[R CC tomo · tomo slice 39/77.0]
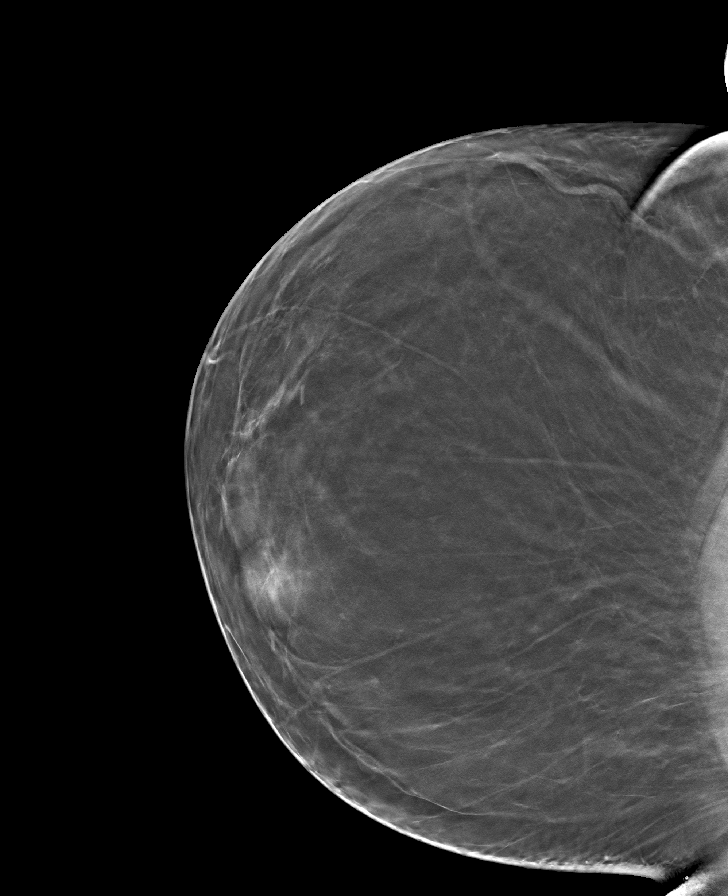

[8 of 40 positions shown; findings below may reference images not displayed]

ACR Breast Density Category b: There are scattered areas of
fibroglandular density.
FINDINGS: There are no findings suspicious for malignancy. The images were
evaluated with computer-aided detection.
IMPRESSION: No mammographic evidence of malignancy. A result letter of this
screening mammogram will be mailed directly to the patient.

RECOMMENDATION:
Screening mammogram in one year. (Code:[OD])

BI-RADS CATEGORY  1: Negative.

## 2020-05-20 NOTE — Telephone Encounter (Signed)
Patient called to ask for an order for a walker with a seat for her upcoming surgery. Please advise.

## 2020-05-21 NOTE — Telephone Encounter (Signed)
She can pick up today I called her to advise

## 2020-05-22 ENCOUNTER — Encounter (HOSPITAL_COMMUNITY)
Admission: RE | Admit: 2020-05-22 | Discharge: 2020-05-22 | Disposition: A | Discharge status: Home / Self Care | Payer: Federal, State, Local not specified - PPO | Source: Ambulatory Visit | Attending: Orthopedic Surgery | Admitting: Orthopedic Surgery

## 2020-05-22 ENCOUNTER — Telehealth: Payer: Self-pay | Admitting: Orthopedic Surgery

## 2020-05-22 ENCOUNTER — Encounter (HOSPITAL_COMMUNITY): Payer: Self-pay

## 2020-05-22 ENCOUNTER — Other Ambulatory Visit: Payer: Self-pay

## 2020-05-22 DIAGNOSIS — Z01818 Encounter for other preprocedural examination: Secondary | ICD-10-CM | POA: Diagnosis not present

## 2020-05-22 LAB — PREPARE RBC (CROSSMATCH)

## 2020-05-22 LAB — CBC WITH DIFFERENTIAL/PLATELET
Abs Immature Granulocytes: 0.03 10*3/uL (ref 0.00–0.07)
Basophils Absolute: 0.1 10*3/uL (ref 0.0–0.1)
Basophils Relative: 1 %
Eosinophils Absolute: 0.1 10*3/uL (ref 0.0–0.5)
Eosinophils Relative: 2 %
HCT: 44.4 % (ref 36.0–46.0)
Hemoglobin: 13.3 g/dL (ref 12.0–15.0)
Immature Granulocytes: 0 %
Lymphocytes Relative: 30 %
Lymphs Abs: 2.6 10*3/uL (ref 0.7–4.0)
MCH: 21.3 pg — ABNORMAL LOW (ref 26.0–34.0)
MCHC: 30 g/dL (ref 30.0–36.0)
MCV: 71.3 fL — ABNORMAL LOW (ref 80.0–100.0)
Monocytes Absolute: 0.8 10*3/uL (ref 0.1–1.0)
Monocytes Relative: 9 %
Neutro Abs: 5 10*3/uL (ref 1.7–7.7)
Neutrophils Relative %: 58 %
Platelets: 341 10*3/uL (ref 150–400)
RBC: 6.23 MIL/uL — ABNORMAL HIGH (ref 3.87–5.11)
RDW: 18.7 % — ABNORMAL HIGH (ref 11.5–15.5)
WBC: 8.5 10*3/uL (ref 4.0–10.5)
nRBC: 0 % (ref 0.0–0.2)

## 2020-05-22 LAB — BASIC METABOLIC PANEL
Anion gap: 9 (ref 5–15)
BUN: 21 mg/dL (ref 8–23)
CO2: 25 mmol/L (ref 22–32)
Calcium: 10.4 mg/dL — ABNORMAL HIGH (ref 8.9–10.3)
Chloride: 103 mmol/L (ref 98–111)
Creatinine, Ser: 1.11 mg/dL — ABNORMAL HIGH (ref 0.44–1.00)
GFR, Estimated: 55 mL/min — ABNORMAL LOW (ref >60)
Glucose, Bld: 121 mg/dL — ABNORMAL HIGH (ref 70–99)
Potassium: 4.4 mmol/L (ref 3.5–5.1)
Sodium: 137 mmol/L (ref 135–145)

## 2020-05-22 LAB — HEMOGLOBIN A1C
Hgb A1c MFr Bld: 7.1 % — ABNORMAL HIGH (ref 4.8–5.6)
Mean Plasma Glucose: 157.07 mg/dL

## 2020-05-22 LAB — GLUCOSE, CAPILLARY: Glucose-Capillary: 111 mg/dL — ABNORMAL HIGH (ref 70–99)

## 2020-05-22 NOTE — Telephone Encounter (Signed)
Corrected order for her when she was here, thanks

## 2020-05-22 NOTE — Telephone Encounter (Signed)
Patient came by office to pick up order for walker as requested, with wheels and with seat. States that the [facility] provider that she just spoke with advises a regular walker. Please advise. (patient is here)

## 2020-05-23 NOTE — Pre-Procedure Instructions (Signed)
Hgba1c routed to PCP. 

## 2020-05-27 ENCOUNTER — Other Ambulatory Visit: Payer: Self-pay

## 2020-05-27 ENCOUNTER — Other Ambulatory Visit (HOSPITAL_COMMUNITY)
Admission: RE | Admit: 2020-05-27 | Discharge: 2020-05-27 | Disposition: A | Discharge status: Home / Self Care | Payer: Federal, State, Local not specified - PPO | Source: Ambulatory Visit | Attending: Orthopedic Surgery | Admitting: Orthopedic Surgery

## 2020-05-27 DIAGNOSIS — Z20822 Contact with and (suspected) exposure to covid-19: Secondary | ICD-10-CM | POA: Diagnosis not present

## 2020-05-27 DIAGNOSIS — Z01812 Encounter for preprocedural laboratory examination: Secondary | ICD-10-CM | POA: Insufficient documentation

## 2020-05-27 LAB — SARS CORONAVIRUS 2 (TAT 6-24 HRS): SARS Coronavirus 2: NEGATIVE

## 2020-05-27 NOTE — H&P (Signed)
HISTORY AND PHYSICAL FOR LEFT TOTAL KNEE REPLACEMENT       Chief Complaint  Patient presents with  . Knee Pain    left knee surgical consult    Assessment and plan 65 year old female with diabetes usually has a good hemoglobin A1c, BMI is approaching forty patient has end-stage arthritis of the left knee MRI shows no meniscal tear but extensive cartilage loss on the medial compartment which corresponds with her pain.  Patient is a good surgical candidate as long as she keeps her hemoglobin see under control and her weight under control.  Patient will go on the list for inpatient surgery when available for left total knee  The procedure has been fully reviewed with the patient; The risks and benefits of surgery have been discussed and explained and understood. Alternative treatment has also been reviewed, questions were encouraged and answered. The postoperative plan is also been reviewed.   65 year old female presents for evaluation for left knee pain previously seen by Dr. Luna Glasgow treated with physical therapy and cortisone injection complains of severe left knee pain and difficulty with certain activities of daily living including but not limited to stair climbing getting out of a chair or getting in and out of a car.  She has decreased range of motion giving way of the left knee.  UPDATE: Pamela Stein has had hyaluronic acid injections and therapy over the last 6 months and still has severe left knee pain   Review of systems Review of Systems  Constitutional: Negative for chills and fever.  Respiratory: Positive for shortness of breath.   Cardiovascular: Negative for chest pain.  All other systems reviewed and are negative.        Past Medical History:  Diagnosis Date  . Anxiety   . Back pain   . Bronchitis   . Complication of anesthesia    stopped breathing after endometrial ablation procedure prior to her being diagn. with OSA  . Diabetes mellitus   . Dizziness    . Heart murmur   . History of gout   . Hyperlipidemia   . Hypertension   . Neuropathy   . Obesity   . Obstructive sleep apnea    CPAP         Past Surgical History:  Procedure Laterality Date  . ABDOMINAL HYSTERECTOMY    . ADENOIDECTOMY    . CATARACT EXTRACTION Left    right 02/2018  . COLONOSCOPY  2008   diminutive rectal polyp, s/p removal. polypoid mucosa  . COLONOSCOPY WITH PROPOFOL N/A 01/03/2018   Procedure: COLONOSCOPY WITH PROPOFOL;  Surgeon: Daneil Dolin, MD;  Location: [facility];  Service: Endoscopy;  Laterality: N/A;  12:00pm  . DILATION AND CURETTAGE OF UTERUS    . ENDOMETRIAL ABLATION    . EYE SURGERY Left 12/04/2013   cataract  . PARATHYROIDECTOMY N/A 12/22/2016   Procedure: PARATHYROIDECTOMY, NECK EXPLORATION;  Surgeon: Jackolyn Confer, MD;  Location: [facility];  Service: General;  Laterality: N/A;  . TONSILLECTOMY           Family History  Problem Relation Age of Onset  . Cancer Father        throat  . Throat cancer Father   . Hypertension Father   . Diabetes Father   . Diabetes Maternal Grandmother   . Hypertension Maternal Grandfather   . Hypertension Paternal Grandmother   . Diabetes Paternal Grandfather   . Hypertension Mother   . Diabetes Mother   . Cancer Paternal Aunt   .  Colon cancer Neg Hx   . Colon polyps Neg Hx    Social History        Tobacco Use  . Smoking status: Never Smoker  . Smokeless tobacco: Never Used  Vaping Use  . Vaping Use: Never used  Substance Use Topics  . Alcohol use: No    Alcohol/week: 0.0 standard drinks  . Drug use: No        Allergies  Allergen Reactions  . Ace Inhibitors Cough    Active Medications      Current Meds  Medication Sig  . albuterol (PROVENTIL HFA;VENTOLIN HFA) 108 (90 Base) MCG/ACT inhaler Inhale 1-2 puffs into the lungs every 6 (six) hours as needed for wheezing or shortness of breath.  Marland Kitchen amLODipine (NORVASC) 10 MG tablet  TAKE 1 TABLET BY MOUTH DAILY (Patient taking differently: Take 10 mg by mouth daily. )  . aspirin EC 81 MG tablet Take 81 mg by mouth daily.  . Cholecalciferol 1.25 MG (50000 UT) capsule cholecalciferol (vitamin D3) 1,250 mcg (50,000 unit) capsule  TAKE 1 CAPSULE BY MOUTH EVERY 4 WEEKS  . DULoxetine (CYMBALTA) 60 MG capsule Take 1 capsule every day by oral route.  Marland Kitchen escitalopram (LEXAPRO) 10 MG tablet Take 10 mg by mouth daily.  . furosemide (LASIX) 20 MG tablet Take 20 mg by mouth daily.   Marland Kitchen gabapentin (NEURONTIN) 300 MG capsule Take 1 capsule (300 mg total) by mouth 3 (three) times daily. (Patient taking differently: Take 300 mg by mouth daily. )  . losartan (COZAAR) 25 MG tablet Take 25 mg by mouth daily.   . metFORMIN (GLUCOPHAGE) 500 MG tablet TAKE 1 TABLET BY MOUTH TWICE DAILY WITH A MEAL (Patient taking differently: Take 500 mg by mouth daily with lunch. )  . pravastatin (PRAVACHOL) 80 MG tablet TAKE 1 TABLET(80 MG) BY MOUTH EVERY EVENING (Patient taking differently: Take 80 mg by mouth every evening. )  . spironolactone (ALDACTONE) 25 MG tablet Take 25 mg by mouth daily.       BP (!) 141/84   Pulse 93   Ht 5' 2.5" (1.588 m)   Wt 222 lb (100.7 kg)   BMI 39.96 kg/m   Physical Exam Constitutional:      General: She is not in acute distress.    Appearance: She is well-developed.  Cardiovascular:     Comments: No peripheral edema Skin:    General: Skin is warm and dry.  Neurological:     Mental Status: She is alert and oriented to person, place, and time.     Sensory: No sensory deficit.     Coordination: Coordination normal.     Gait: Gait normal.     Deep Tendon Reflexes: Reflexes are normal and symmetric.     Ortho Exam  Left knee Tenderness medial compartment small effusion Knee flexion arc 115 degrees ACL PCL stable Strength and stability normal laterally Motor exam normal    MEDICAL DECISION MAKING  A.      Encounter Diagnoses  Name Primary?   . Chronic pain of left knee Yes  . Primary localized osteoarthritis of knee     B. DATA ANALYSED:   IMAGING: Interpretation of images: I looked at the images of her knee she has a varus knee medial joint space narrowing MRI also shows cartilage loss medially    C. MANAGEMENT   LEFT TKA   Arther Abbott, MD

## 2020-05-28 ENCOUNTER — Ambulatory Visit (HOSPITAL_COMMUNITY): Payer: Federal, State, Local not specified - PPO | Admitting: Certified Registered Nurse Anesthetist

## 2020-05-28 ENCOUNTER — Encounter (HOSPITAL_COMMUNITY)
Admission: RE | Disposition: A | Discharge status: Home Health | Payer: Self-pay | Source: Home / Self Care | Attending: Orthopedic Surgery

## 2020-05-28 ENCOUNTER — Ambulatory Visit (HOSPITAL_COMMUNITY): Payer: Federal, State, Local not specified - PPO

## 2020-05-28 ENCOUNTER — Encounter (HOSPITAL_COMMUNITY): Payer: Self-pay | Admitting: Orthopedic Surgery

## 2020-05-28 ENCOUNTER — Inpatient Hospital Stay (HOSPITAL_COMMUNITY)
Admission: RE | Admit: 2020-05-28 | Discharge: 2020-06-03 | DRG: 908 | Disposition: A | Discharge status: Home Health | Payer: Federal, State, Local not specified - PPO | Attending: Orthopedic Surgery | Admitting: Orthopedic Surgery

## 2020-05-28 DIAGNOSIS — Y92230 Patient room in hospital as the place of occurrence of the external cause: Secondary | ICD-10-CM | POA: Diagnosis present

## 2020-05-28 DIAGNOSIS — Z96652 Presence of left artificial knee joint: Secondary | ICD-10-CM

## 2020-05-28 DIAGNOSIS — E785 Hyperlipidemia, unspecified: Secondary | ICD-10-CM | POA: Diagnosis not present

## 2020-05-28 DIAGNOSIS — Z7984 Long term (current) use of oral hypoglycemic drugs: Secondary | ICD-10-CM

## 2020-05-28 DIAGNOSIS — G8929 Other chronic pain: Secondary | ICD-10-CM | POA: Diagnosis present

## 2020-05-28 DIAGNOSIS — I10 Essential (primary) hypertension: Secondary | ICD-10-CM | POA: Diagnosis not present

## 2020-05-28 DIAGNOSIS — M1712 Unilateral primary osteoarthritis, left knee: Secondary | ICD-10-CM | POA: Diagnosis present

## 2020-05-28 DIAGNOSIS — W06XXXA Fall from bed, initial encounter: Secondary | ICD-10-CM | POA: Diagnosis not present

## 2020-05-28 DIAGNOSIS — Z7982 Long term (current) use of aspirin: Secondary | ICD-10-CM | POA: Diagnosis not present

## 2020-05-28 DIAGNOSIS — Z79899 Other long term (current) drug therapy: Secondary | ICD-10-CM | POA: Diagnosis not present

## 2020-05-28 DIAGNOSIS — E119 Type 2 diabetes mellitus without complications: Secondary | ICD-10-CM | POA: Diagnosis not present

## 2020-05-28 DIAGNOSIS — Z6841 Body Mass Index (BMI) 40.0 and over, adult: Secondary | ICD-10-CM

## 2020-05-28 DIAGNOSIS — Z833 Family history of diabetes mellitus: Secondary | ICD-10-CM | POA: Diagnosis not present

## 2020-05-28 DIAGNOSIS — M25562 Pain in left knee: Secondary | ICD-10-CM | POA: Diagnosis present

## 2020-05-28 DIAGNOSIS — S8002XA Contusion of left knee, initial encounter: Secondary | ICD-10-CM | POA: Diagnosis present

## 2020-05-28 DIAGNOSIS — T402X5A Adverse effect of other opioids, initial encounter: Secondary | ICD-10-CM | POA: Diagnosis present

## 2020-05-28 DIAGNOSIS — T8131XA Disruption of external operation (surgical) wound, not elsewhere classified, initial encounter: Secondary | ICD-10-CM | POA: Diagnosis not present

## 2020-05-28 DIAGNOSIS — G8918 Other acute postprocedural pain: Secondary | ICD-10-CM | POA: Diagnosis not present

## 2020-05-28 DIAGNOSIS — G4733 Obstructive sleep apnea (adult) (pediatric): Secondary | ICD-10-CM | POA: Diagnosis not present

## 2020-05-28 DIAGNOSIS — E669 Obesity, unspecified: Secondary | ICD-10-CM | POA: Diagnosis present

## 2020-05-28 HISTORY — PX: TOTAL KNEE ARTHROPLASTY: SHX125

## 2020-05-28 LAB — ABO/RH: ABO/RH(D): A POS

## 2020-05-28 LAB — GLUCOSE, CAPILLARY
Glucose-Capillary: 109 mg/dL — ABNORMAL HIGH (ref 70–99)
Glucose-Capillary: 116 mg/dL — ABNORMAL HIGH (ref 70–99)

## 2020-05-28 IMAGING — DX DG KNEE 1-2V PORT*L*
2 series · 2 of 2 positions shown · non-contrast
Comparison: MRI left knee dated [DATE].

CLINICAL DATA: Total knee replacement.

EXAM:
PORTABLE LEFT KNEE - 1-2 VIEW

[knee ap]
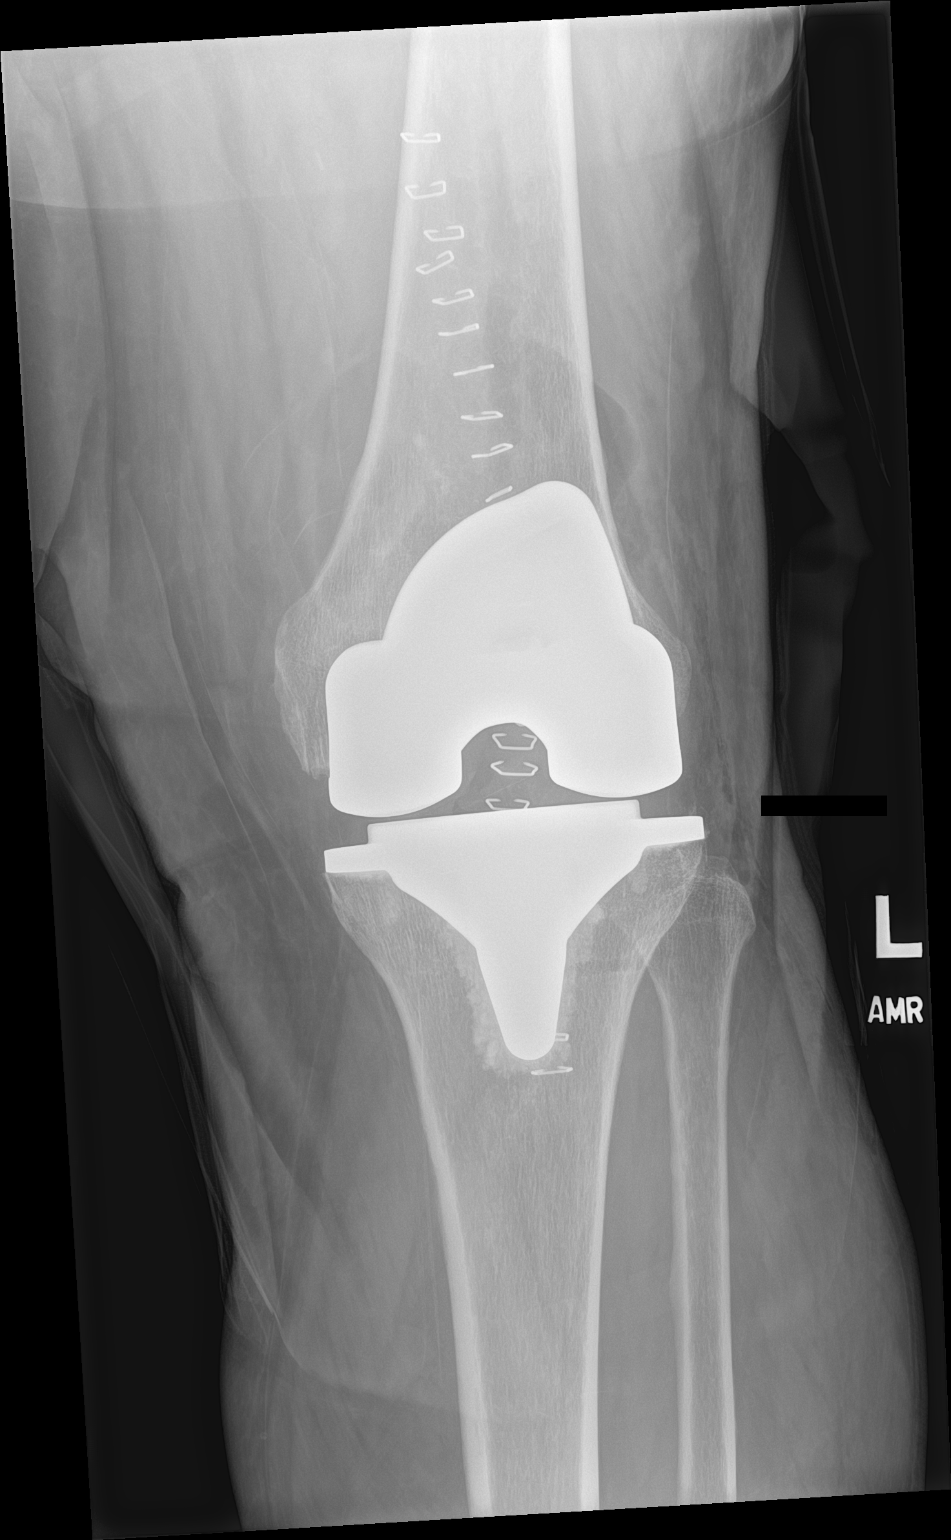

[knee lat]
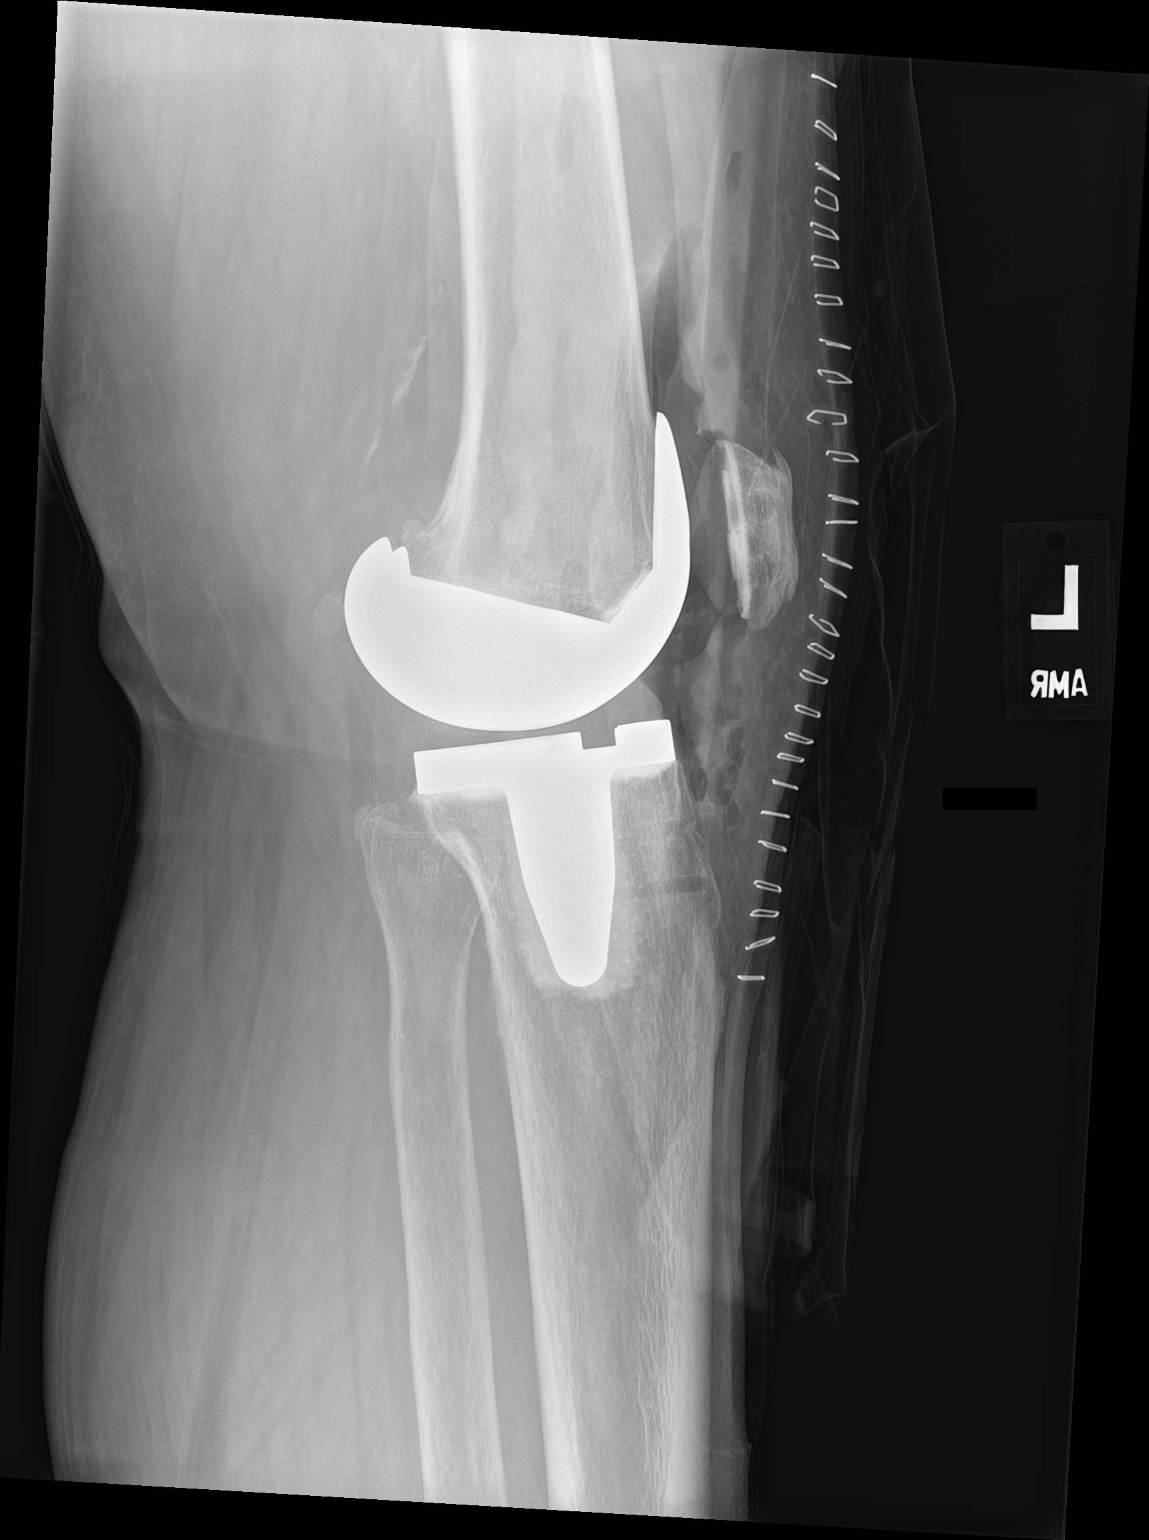

[2 of 2 positions shown; findings below may reference images not displayed]

FINDINGS: The left knee demonstrates a total knee arthroplasty without
evidence of hardware failure or complication. There is expected
intra-articular air. There is no fracture or dislocation. The
alignment is anatomic. Post-surgical changes noted in the
surrounding soft tissues.
IMPRESSION: 1. Interval left total knee arthroplasty without acute postoperative
complication.

## 2020-05-28 SURGERY — ARTHROPLASTY, KNEE, TOTAL
Anesthesia: Spinal | Site: Knee | Laterality: Left

## 2020-05-28 MED ORDER — HYDROCODONE-ACETAMINOPHEN 5-325 MG PO TABS: 1.0000 | ORAL_TABLET | ORAL | Status: DC | PRN

## 2020-05-28 MED ORDER — CELECOXIB 100 MG PO CAPS: 200.0000 mg | ORAL_CAPSULE | Freq: Two times a day (BID) | ORAL | Status: DC

## 2020-05-28 MED ORDER — SODIUM CHLORIDE 0.9 % IR SOLN
Status: DC | PRN
  Administered 2020-05-28: 3000 mL

## 2020-05-28 MED ORDER — ONDANSETRON HCL 4 MG/2ML IJ SOLN: 4.0000 mg | Freq: Four times a day (QID) | INTRAMUSCULAR | Status: DC | PRN

## 2020-05-28 MED ORDER — METOCLOPRAMIDE HCL 10 MG PO TABS: 5.0000 mg | ORAL_TABLET | Freq: Three times a day (TID) | ORAL | Status: DC | PRN

## 2020-05-28 MED ORDER — ONDANSETRON HCL 4 MG PO TABS: 4.0000 mg | ORAL_TABLET | Freq: Four times a day (QID) | ORAL | Status: DC | PRN

## 2020-05-28 MED ORDER — SODIUM CHLORIDE 0.9 % IV SOLN: INTRAVENOUS | Status: AC

## 2020-05-28 MED ORDER — MIDAZOLAM HCL 5 MG/5ML IJ SOLN
INTRAMUSCULAR | Status: DC | PRN
  Administered 2020-05-28: .5 mg via INTRAVENOUS
  Administered 2020-05-28: 1 mg via INTRAVENOUS
  Administered 2020-05-28: .5 mg via INTRAVENOUS

## 2020-05-28 MED ORDER — MENTHOL 3 MG MT LOZG: 1.0000 | LOZENGE | OROMUCOSAL | Status: DC | PRN

## 2020-05-28 MED ORDER — SENNOSIDES-DOCUSATE SODIUM 8.6-50 MG PO TABS: 1.0000 | ORAL_TABLET | Freq: Every evening | ORAL | Status: DC | PRN

## 2020-05-28 MED ORDER — DIPHENHYDRAMINE HCL 12.5 MG/5ML PO ELIX: 12.5000 mg | ORAL_SOLUTION | ORAL | Status: DC | PRN

## 2020-05-28 MED ORDER — FENTANYL CITRATE (PF) 100 MCG/2ML IJ SOLN: INTRAMUSCULAR | Status: DC | PRN

## 2020-05-28 MED ORDER — LACTATED RINGERS IV SOLN: INTRAVENOUS | Status: DC

## 2020-05-28 MED ORDER — BUPIVACAINE-EPINEPHRINE (PF) 0.5% -1:200000 IJ SOLN
INTRAMUSCULAR | Status: DC | PRN
Start: 2020-05-28 — End: 2020-05-28
  Administered 2020-05-28: 30 mL via PERINEURAL

## 2020-05-28 MED ORDER — HYDROCODONE-ACETAMINOPHEN 7.5-325 MG PO TABS: 1.0000 | ORAL_TABLET | ORAL | Status: DC | PRN

## 2020-05-28 MED ORDER — BUPIVACAINE-MELOXICAM ER 200-6 MG/7ML IJ SOLN
INTRAMUSCULAR | Status: DC | PRN
  Administered 2020-05-28: 14 mL

## 2020-05-28 MED ORDER — LOSARTAN POTASSIUM 50 MG PO TABS
25.0000 mg | ORAL_TABLET | Freq: Every day | ORAL | Status: DC
  Administered 2020-05-29 – 2020-06-03 (×6): 25 mg via ORAL
  Filled 2020-05-28 (×6): qty 1

## 2020-05-28 MED ORDER — ACETAMINOPHEN 500 MG PO TABS
500.0000 mg | ORAL_TABLET | Freq: Four times a day (QID) | ORAL | Status: AC
  Administered 2020-05-28 – 2020-05-29 (×4): 500 mg via ORAL
  Filled 2020-05-28 (×4): qty 1

## 2020-05-28 MED ORDER — CEFAZOLIN SODIUM-DEXTROSE 2-4 GM/100ML-% IV SOLN
2.0000 g | INTRAVENOUS | Status: AC
  Administered 2020-05-28: 2 g via INTRAVENOUS

## 2020-05-28 MED ORDER — CEFAZOLIN SODIUM-DEXTROSE 2-4 GM/100ML-% IV SOLN
2.0000 g | Freq: Four times a day (QID) | INTRAVENOUS | Status: AC
  Administered 2020-05-28 (×2): 2 g via INTRAVENOUS
  Filled 2020-05-28 (×2): qty 100

## 2020-05-28 MED ORDER — METFORMIN HCL 500 MG PO TABS
500.0000 mg | ORAL_TABLET | Freq: Every day | ORAL | Status: DC
  Administered 2020-05-29 – 2020-06-03 (×5): 500 mg via ORAL
  Filled 2020-05-28 (×5): qty 1

## 2020-05-28 MED ORDER — ONDANSETRON HCL 4 MG/2ML IJ SOLN
INTRAMUSCULAR | Status: DC | PRN
  Administered 2020-05-28: 4 mg via INTRAVENOUS

## 2020-05-28 MED ORDER — CELECOXIB 100 MG PO CAPS
200.0000 mg | ORAL_CAPSULE | Freq: Every day | ORAL | Status: DC
  Administered 2020-05-28 – 2020-06-03 (×7): 200 mg via ORAL
  Filled 2020-05-28 (×7): qty 2

## 2020-05-28 MED ORDER — CEFAZOLIN SODIUM-DEXTROSE 2-4 GM/100ML-% IV SOLN
INTRAVENOUS | Status: AC
  Filled 2020-05-28: qty 100

## 2020-05-28 MED ORDER — METHOCARBAMOL 500 MG PO TABS
500.0000 mg | ORAL_TABLET | Freq: Four times a day (QID) | ORAL | Status: DC | PRN
  Administered 2020-05-29: 500 mg via ORAL
  Filled 2020-05-28: qty 1

## 2020-05-28 MED ORDER — ALBUTEROL SULFATE HFA 108 (90 BASE) MCG/ACT IN AERS: 1.0000 | INHALATION_SPRAY | Freq: Four times a day (QID) | RESPIRATORY_TRACT | Status: DC | PRN

## 2020-05-28 MED ORDER — DOCUSATE SODIUM 100 MG PO CAPS
100.0000 mg | ORAL_CAPSULE | Freq: Two times a day (BID) | ORAL | Status: DC
  Administered 2020-05-28 – 2020-05-30 (×4): 100 mg via ORAL
  Filled 2020-05-28 (×5): qty 1

## 2020-05-28 MED ORDER — METHOCARBAMOL 1000 MG/10ML IJ SOLN
INTRAMUSCULAR | Status: AC
  Filled 2020-05-28: qty 10

## 2020-05-28 MED ORDER — 0.9 % SODIUM CHLORIDE (POUR BTL) OPTIME
TOPICAL | Status: DC | PRN
  Administered 2020-05-28: 1000 mL

## 2020-05-28 MED ORDER — TRANEXAMIC ACID-NACL 1000-0.7 MG/100ML-% IV SOLN
1000.0000 mg | INTRAVENOUS | Status: AC
  Administered 2020-05-28: 1000 mg via INTRAVENOUS

## 2020-05-28 MED ORDER — ALUM & MAG HYDROXIDE-SIMETH 200-200-20 MG/5ML PO SUSP: 30.0000 mL | ORAL | Status: DC | PRN

## 2020-05-28 MED ORDER — CHLORHEXIDINE GLUCONATE 0.12 % MT SOLN
15.0000 mL | Freq: Once | OROMUCOSAL | Status: AC
  Administered 2020-05-28: 15 mL via OROMUCOSAL

## 2020-05-28 MED ORDER — TRANEXAMIC ACID-NACL 1000-0.7 MG/100ML-% IV SOLN
INTRAVENOUS | Status: AC
  Filled 2020-05-28: qty 100

## 2020-05-28 MED ORDER — BUPIVACAINE IN DEXTROSE 0.75-8.25 % IT SOLN
INTRATHECAL | Status: DC | PRN
  Administered 2020-05-28: 11.75 mg via INTRATHECAL

## 2020-05-28 MED ORDER — ASPIRIN 81 MG PO CHEW: 81.0000 mg | CHEWABLE_TABLET | Freq: Two times a day (BID) | ORAL | Status: DC

## 2020-05-28 MED ORDER — PROPOFOL 500 MG/50ML IV EMUL
INTRAVENOUS | Status: DC | PRN
  Administered 2020-05-28: 50 ug/kg/min via INTRAVENOUS

## 2020-05-28 MED ORDER — ESCITALOPRAM OXALATE 10 MG PO TABS
10.0000 mg | ORAL_TABLET | Freq: Every day | ORAL | Status: DC
  Administered 2020-05-29 – 2020-06-03 (×6): 10 mg via ORAL
  Filled 2020-05-28 (×6): qty 1

## 2020-05-28 MED ORDER — PANTOPRAZOLE SODIUM 40 MG PO TBEC
40.0000 mg | DELAYED_RELEASE_TABLET | Freq: Every day | ORAL | Status: DC
  Administered 2020-05-29 – 2020-06-02 (×5): 40 mg via ORAL
  Filled 2020-05-28 (×6): qty 1

## 2020-05-28 MED ORDER — BUPIVACAINE-EPINEPHRINE (PF) 0.5% -1:200000 IJ SOLN
INTRAMUSCULAR | Status: AC
  Filled 2020-05-28: qty 30

## 2020-05-28 MED ORDER — METOCLOPRAMIDE HCL 5 MG/ML IJ SOLN: 5.0000 mg | Freq: Three times a day (TID) | INTRAMUSCULAR | Status: DC | PRN

## 2020-05-28 MED ORDER — FENTANYL CITRATE (PF) 100 MCG/2ML IJ SOLN
INTRAMUSCULAR | Status: DC | PRN
  Administered 2020-05-28: 25 ug via INTRATHECAL

## 2020-05-28 MED ORDER — ONDANSETRON HCL 4 MG/2ML IJ SOLN
INTRAMUSCULAR | Status: AC
  Filled 2020-05-28: qty 4

## 2020-05-28 MED ORDER — EPINEPHRINE 1 MG/10ML IJ SOSY
PREFILLED_SYRINGE | INTRAMUSCULAR | Status: DC | PRN
  Administered 2020-05-28: .1 mg via INTRAVENOUS

## 2020-05-28 MED ORDER — ONDANSETRON HCL 4 MG/2ML IJ SOLN
4.0000 mg | Freq: Four times a day (QID) | INTRAMUSCULAR | Status: DC
  Administered 2020-05-28 – 2020-05-30 (×7): 4 mg via INTRAVENOUS
  Filled 2020-05-28 (×15): qty 2

## 2020-05-28 MED ORDER — MORPHINE SULFATE (PF) 2 MG/ML IV SOLN: 0.5000 mg | INTRAVENOUS | Status: DC | PRN

## 2020-05-28 MED ORDER — ORAL CARE MOUTH RINSE: 15.0000 mL | Freq: Once | OROMUCOSAL | Status: AC

## 2020-05-28 MED ORDER — FUROSEMIDE 20 MG PO TABS
20.0000 mg | ORAL_TABLET | Freq: Every day | ORAL | Status: DC
  Administered 2020-05-29 – 2020-06-03 (×5): 20 mg via ORAL
  Filled 2020-05-28 (×5): qty 1

## 2020-05-28 MED ORDER — OXYCODONE HCL 5 MG PO TABS
5.0000 mg | ORAL_TABLET | ORAL | Status: DC
  Administered 2020-05-28 – 2020-05-30 (×13): 5 mg via ORAL
  Filled 2020-05-28 (×13): qty 1

## 2020-05-28 MED ORDER — METHOCARBAMOL 1000 MG/10ML IJ SOLN
500.0000 mg | Freq: Four times a day (QID) | INTRAVENOUS | Status: DC | PRN
  Administered 2020-05-29: 500 mg via INTRAVENOUS
  Filled 2020-05-28: qty 5

## 2020-05-28 MED ORDER — ACETAMINOPHEN 325 MG PO TABS
325.0000 mg | ORAL_TABLET | Freq: Four times a day (QID) | ORAL | Status: DC | PRN
  Administered 2020-05-29: 325 mg via ORAL
  Filled 2020-05-28: qty 1

## 2020-05-28 MED ORDER — TRANEXAMIC ACID-NACL 1000-0.7 MG/100ML-% IV SOLN
1000.0000 mg | Freq: Once | INTRAVENOUS | Status: AC
  Administered 2020-05-28: 1000 mg via INTRAVENOUS
  Filled 2020-05-28: qty 100

## 2020-05-28 MED ORDER — PHENOL 1.4 % MT LIQD: 1.0000 | OROMUCOSAL | Status: DC | PRN

## 2020-05-28 MED ORDER — ROPIVACAINE HCL 5 MG/ML IJ SOLN
INTRAMUSCULAR | Status: AC
  Filled 2020-05-28: qty 30

## 2020-05-28 MED ORDER — PRAVASTATIN SODIUM 40 MG PO TABS
80.0000 mg | ORAL_TABLET | Freq: Every evening | ORAL | Status: DC
  Administered 2020-05-28 – 2020-06-02 (×6): 80 mg via ORAL
  Filled 2020-05-28 (×6): qty 2

## 2020-05-28 MED ORDER — PROPOFOL 10 MG/ML IV BOLUS
INTRAVENOUS | Status: AC
  Filled 2020-05-28: qty 40

## 2020-05-28 MED ORDER — SPIRONOLACTONE 25 MG PO TABS
25.0000 mg | ORAL_TABLET | Freq: Every day | ORAL | Status: DC
  Administered 2020-05-29 – 2020-06-03 (×5): 25 mg via ORAL
  Filled 2020-05-28 (×5): qty 1

## 2020-05-28 MED ORDER — ALBUTEROL SULFATE (2.5 MG/3ML) 0.083% IN NEBU: 2.5000 mg | INHALATION_SOLUTION | Freq: Four times a day (QID) | RESPIRATORY_TRACT | Status: DC | PRN

## 2020-05-28 MED ORDER — FENTANYL CITRATE (PF) 100 MCG/2ML IJ SOLN
INTRAMUSCULAR | Status: DC | PRN
  Administered 2020-05-28 (×3): 25 ug via INTRAVENOUS

## 2020-05-28 MED ORDER — ASPIRIN 81 MG PO CHEW
81.0000 mg | CHEWABLE_TABLET | Freq: Two times a day (BID) | ORAL | Status: DC
Start: 2020-05-29 — End: 2020-06-03
  Administered 2020-05-29 – 2020-06-03 (×10): 81 mg via ORAL
  Filled 2020-05-28 (×10): qty 1

## 2020-05-28 MED ORDER — VITAMIN D (ERGOCALCIFEROL) 1.25 MG (50000 UNIT) PO CAPS
50000.0000 [IU] | ORAL_CAPSULE | ORAL | Status: DC
  Administered 2020-06-02: 50000 [IU] via ORAL
  Filled 2020-05-28: qty 1

## 2020-05-28 MED ORDER — EPINEPHRINE PF 1 MG/ML IJ SOLN
INTRAMUSCULAR | Status: AC
  Filled 2020-05-28: qty 1

## 2020-05-28 MED ORDER — FENTANYL CITRATE (PF) 100 MCG/2ML IJ SOLN: 25.0000 ug | INTRAMUSCULAR | Status: DC | PRN

## 2020-05-28 MED ORDER — PREGABALIN 50 MG PO CAPS
50.0000 mg | ORAL_CAPSULE | Freq: Three times a day (TID) | ORAL | Status: DC
  Administered 2020-05-28 – 2020-05-30 (×7): 50 mg via ORAL
  Filled 2020-05-28 (×7): qty 1

## 2020-05-28 MED ORDER — MIDAZOLAM HCL 2 MG/2ML IJ SOLN
INTRAMUSCULAR | Status: AC
  Filled 2020-05-28: qty 2

## 2020-05-28 MED ORDER — ONDANSETRON HCL 4 MG/2ML IJ SOLN: 4.0000 mg | Freq: Once | INTRAMUSCULAR | Status: DC | PRN

## 2020-05-28 MED ORDER — FENTANYL CITRATE (PF) 100 MCG/2ML IJ SOLN
INTRAMUSCULAR | Status: AC
  Filled 2020-05-28: qty 2

## 2020-05-28 MED ORDER — METHOCARBAMOL 1000 MG/10ML IJ SOLN
500.0000 mg | Freq: Four times a day (QID) | INTRAVENOUS | Status: DC
  Administered 2020-05-28 (×2): 500 mg via INTRAVENOUS
  Filled 2020-05-28: qty 5

## 2020-05-28 SURGICAL SUPPLY — 67 items
ATTUNE PS FEM LT SZ 6 CEM KNEE (Femur) ×2 IMPLANT
BANDAGE ELASTIC 4 VELCRO NS (GAUZE/BANDAGES/DRESSINGS) ×4 IMPLANT
BANDAGE ELASTIC 6 VELCRO NS (GAUZE/BANDAGES/DRESSINGS) ×2 IMPLANT
BANDAGE ESMARK 6X9 LF (GAUZE/BANDAGES/DRESSINGS) ×1 IMPLANT
BASEPLATE TIB CMT FB PCKT SZ6 (Knees) ×2 IMPLANT
BLADE HEX COATED 2.75 (ELECTRODE) ×2 IMPLANT
BLADE SAGITTAL 25.0X1.27X90 (BLADE) ×2 IMPLANT
BLADE SAW SGTL 11.0X1.19X90.0M (BLADE) ×2 IMPLANT
BNDG CMPR 9X6 STRL LF SNTH (GAUZE/BANDAGES/DRESSINGS) ×1
BNDG CMPR STD VLCR NS LF 5.8X4 (GAUZE/BANDAGES/DRESSINGS) ×2
BNDG CMPR STD VLCR NS LF 5.8X6 (GAUZE/BANDAGES/DRESSINGS) ×1
BNDG ELASTIC 4X5.8 VLCR NS LF (GAUZE/BANDAGES/DRESSINGS) ×4 IMPLANT
BNDG ELASTIC 6X5.8 VLCR NS LF (GAUZE/BANDAGES/DRESSINGS) ×2 IMPLANT
BNDG ESMARK 6X9 LF (GAUZE/BANDAGES/DRESSINGS) ×2
CEMENT HV SMART SET (Cement) ×4 IMPLANT
CLOTH BEACON ORANGE TIMEOUT ST (SAFETY) ×2 IMPLANT
COOLER ICEMAN CLASSIC (MISCELLANEOUS) ×2 IMPLANT
COVER LIGHT HANDLE STERIS (MISCELLANEOUS) ×4 IMPLANT
COVER WAND RF STERILE (DRAPES) ×2 IMPLANT
CUFF TOURN SGL QUICK 34 (TOURNIQUET CUFF) ×2
CUFF TRNQT CYL 34X4.125X (TOURNIQUET CUFF) ×1 IMPLANT
DECANTER SPIKE VIAL GLASS SM (MISCELLANEOUS) IMPLANT
DRAPE BACK TABLE (DRAPES) ×2 IMPLANT
DRAPE EXTREMITY T 121X128X90 (DISPOSABLE) ×2 IMPLANT
DRSG MEPILEX BORDER 4X12 (GAUZE/BANDAGES/DRESSINGS) ×2 IMPLANT
DURAPREP 26ML APPLICATOR (WOUND CARE) ×4 IMPLANT
ELECT REM PT RETURN 9FT ADLT (ELECTROSURGICAL) ×2
ELECTRODE REM PT RTRN 9FT ADLT (ELECTROSURGICAL) ×1 IMPLANT
GLOVE SKINSENSE NS SZ8.0 LF (GLOVE) ×2
GLOVE SKINSENSE STRL SZ8.0 LF (GLOVE) ×2 IMPLANT
GLOVE SS N UNI LF 8.5 STRL (GLOVE) ×2 IMPLANT
GLOVE SURG UNDER POLY LF SZ7 (GLOVE) ×4 IMPLANT
GOWN STRL REUS W/TWL LRG LVL3 (GOWN DISPOSABLE) ×6 IMPLANT
GOWN STRL REUS W/TWL XL LVL3 (GOWN DISPOSABLE) ×2 IMPLANT
HANDPIECE INTERPULSE COAX TIP (DISPOSABLE) ×2
HOOD W/PEELAWAY (MISCELLANEOUS) ×8 IMPLANT
INSERT TIB PS FB ATTUNE SZ6X5 (Knees) ×2 IMPLANT
INST SET MAJOR BONE (KITS) ×2 IMPLANT
IV NS IRRIG 3000ML ARTHROMATIC (IV SOLUTION) ×2 IMPLANT
KIT BLADEGUARD II DBL (SET/KITS/TRAYS/PACK) ×2 IMPLANT
KIT TURNOVER KIT A (KITS) ×2 IMPLANT
MANIFOLD NEPTUNE II (INSTRUMENTS) ×2 IMPLANT
MARKER SKIN DUAL TIP RULER LAB (MISCELLANEOUS) ×2 IMPLANT
NEEDLE HYPO 21X1.5 SAFETY (NEEDLE) ×2 IMPLANT
NS IRRIG 1000ML POUR BTL (IV SOLUTION) ×2 IMPLANT
PACK TOTAL JOINT (CUSTOM PROCEDURE TRAY) ×2 IMPLANT
PAD ARMBOARD 7.5X6 YLW CONV (MISCELLANEOUS) ×2 IMPLANT
PAD COLD SHLDR WRAP-ON (PAD) ×2 IMPLANT
PATELLA MEDIAL ATTUN 35MM KNEE (Knees) ×2 IMPLANT
PENCIL SMOKE EVACUATOR (MISCELLANEOUS) ×2 IMPLANT
PILLOW KNEE EXTENSION 0 DEG (MISCELLANEOUS) ×2 IMPLANT
PIN THREADED HEADED SIGMA (PIN) ×2 IMPLANT
PIN/DRILL PACK ORTHO 1/8X3.0 (PIN) ×2 IMPLANT
SAW OSC TIP CART 19.5X105X1.3 (SAW) ×2 IMPLANT
SET BASIN LINEN APH (SET/KITS/TRAYS/PACK) ×2 IMPLANT
SET HNDPC FAN SPRY TIP SCT (DISPOSABLE) ×1 IMPLANT
STAPLER VISISTAT 35W (STAPLE) ×2 IMPLANT
SUT BRALON NAB BRD #1 30IN (SUTURE) ×2 IMPLANT
SUT MNCRL 0 VIOLET CTX 36 (SUTURE) ×1 IMPLANT
SUT MON AB 0 CT1 (SUTURE) ×2 IMPLANT
SUT MONOCRYL 0 CTX 36 (SUTURE) ×2
SYR BULB IRRIG 60ML STRL (SYRINGE) ×2 IMPLANT
TOWEL OR 17X26 4PK STRL BLUE (TOWEL DISPOSABLE) ×2 IMPLANT
TOWER CARTRIDGE SMART MIX (DISPOSABLE) ×2 IMPLANT
TRAY FOLEY MTR SLVR 16FR STAT (SET/KITS/TRAYS/PACK) ×2 IMPLANT
WATER STERILE IRR 1000ML POUR (IV SOLUTION) ×4 IMPLANT
YANKAUER SUCT 12FT TUBE ARGYLE (SUCTIONS) ×2 IMPLANT

## 2020-05-28 NOTE — Interval H&P Note (Signed)
History and Physical Interval Note:  05/28/2020 7:18 AM  Pamela Stein  has presented today for surgery, with the diagnosis of Left knee osteoarthritis.  The various methods of treatment have been discussed with the patient and family. After consideration of risks, benefits and other options for treatment, the patient has consented to  Procedure(s): TOTAL KNEE ARTHROPLASTY (Left) as a surgical intervention.  The patient's history has been reviewed, patient examined, no change in status, stable for surgery.  I have reviewed the patient's chart and labs.  Questions were answered to the patient's satisfaction.     Arther Abbott

## 2020-05-28 NOTE — Anesthesia Postprocedure Evaluation (Signed)
Anesthesia Post Note  Patient: Pamela Stein  Procedure(s) Performed: LEFT TOTAL KNEE ARTHROPLASTY (Left Knee)  Anesthesia Type: Spinal Anesthetic complications: no   No complications documented.   Last Vitals:  Vitals:   05/28/20 1115 05/28/20 1214  BP: 113/69 132/77  Pulse: 72 72  Resp: 18 17  Temp:  36.6 C  SpO2: 100% 100%    Last Pain:  Vitals:   05/28/20 1215  TempSrc:   PainSc: [place]

## 2020-05-28 NOTE — Anesthesia Procedure Notes (Signed)
Anesthesia Regional Block: Adductor canal block   Pre-Anesthetic Checklist: ,, timeout performed, Correct Patient, Correct Site, Correct Laterality, Correct Procedure, Correct Position, site marked, Risks and benefits discussed,  Surgical consent,  Pre-op evaluation,  At surgeon's request and post-op pain management  Laterality: Left  Prep: chloraprep       Needles:   Needle Type: Stimulator Needle - 80     Needle Length: 10cm  Needle Gauge: 22     Additional Needles:   Procedures:,,,, ultrasound used (permanent image in chart),,,,  Narrative:  Start time: 05/28/2020 10:07 AM End time: 05/28/2020 10:15 AM Injection made incrementally with aspirations every 5 mL. Anesthesiologist: Louann Sjogren, MD

## 2020-05-28 NOTE — Transfer of Care (Signed)
Immediate Anesthesia Transfer of Care Note  Patient: Pamela Stein  Procedure(s) Performed: LEFT TOTAL KNEE ARTHROPLASTY (Left Knee)  Patient Location: PACU  Anesthesia Type:Spinal  Level of Consciousness: awake, alert  and patient cooperative  Airway & Oxygen Therapy: Patient Spontanous Breathing  Post-op Assessment: Report given to RN and Post -op Vital signs reviewed and stable  Post vital signs: Reviewed and stable  Last Vitals:  Vitals Value Taken Time  BP    Temp 97.7   Pulse    Resp    SpO2      Last Pain:  Vitals:   05/28/20 0620  TempSrc: Oral  PainSc: 0-No pain    See vital sign flow sheet  Patients Stated Pain Goal: 5 (41/99/14 4458)  Complications: No complications documented.

## 2020-05-28 NOTE — Evaluation (Signed)
Physical Therapy Evaluation Patient Details Name: Pamela Stein MRN: [id number] DOB: 08-May-1955 Today's Date: 05/28/2020  LEFT KNEE ROM:  0 - 115 degrees AMBULATION DISTANCE: 89 feet using RW with Min guard assist    History of Present Illness  Pamela Stein is a 65 y/o female, s/p Left TKA on 05/28/20  with the diagnosis of Left knee osteoarthritis.  Clinical Impression  Patient instructed in and issued HEP with good return demonstrated and understanding acknowledged, demonstrates good return for moving LLE during bed mobility, slow labored cadence with fair return for left heel to toe stepping during gait training, limited mostly due to c/o fatigue and mild increase in left knee pain.  Patient tolerated sitting up in chair with LLE dangling after therapy - RN aware.  Patient will benefit from continued physical therapy in hospital and recommended venue below to increase strength, balance, endurance for safe ADLs and gait.    Follow Up Recommendations Home health PT;Supervision for mobility/OOB;Supervision - Intermittent    Equipment Recommendations  None recommended by PT    Recommendations for Other Services       Precautions / Restrictions Precautions Precautions: Fall Restrictions Weight Bearing Restrictions: Yes LLE Weight Bearing: Weight bearing as tolerated      Mobility  Bed Mobility Overal bed mobility: Needs Assistance Bed Mobility: Supine to Sit;Sit to Supine     Supine to sit: Modified independent (Device/Increase time);Supervision Sit to supine: Modified independent (Device/Increase time);Supervision   General bed mobility comments: increased time, labored movement    Transfers Overall transfer level: Needs assistance Equipment used: Rolling walker (2 wheeled) Transfers: Sit to/from [employer] Transfers Sit to Stand: Supervision Stand pivot transfers: Supervision       General transfer comment: increased time, labored  movement  Ambulation/Gait Ambulation/Gait assistance: Min guard Gait Distance (Feet): 80 Feet Assistive device: Rolling walker (2 wheeled) Gait Pattern/deviations: Decreased step length - left;Decreased stance time - left;Antalgic;Decreased stride length Gait velocity: decreased   General Gait Details: slow labored cadence with fair return for left heel to toe stepping, requires verbal cues to step closer to RW with fair/good carryover, limited mostly due to c/o fatigue and mild increase in left knee pain  Stairs            Wheelchair Mobility    Modified Rankin (Stroke Patients Only)       Balance Overall balance assessment: Needs assistance Sitting-balance support: Feet supported;No upper extremity supported Sitting balance-Leahy Scale: Good Sitting balance - Comments: seated at EOB   Standing balance support: During functional activity;Bilateral upper extremity supported Standing balance-Leahy Scale: Fair Standing balance comment: fair/good using RW                             Pertinent Vitals/Pain Pain Assessment: 0-10 Pain Score: 5  Pain Location: left knee Pain Descriptors / Indicators: Sore Pain Intervention(s): Limited activity within patient's tolerance;Monitored during session;Repositioned    Home Living Family/patient expects to be discharged to:: Private residence Living Arrangements: Alone Available Help at Discharge: Friend(s);Neighbor;Available PRN/intermittently Type of Home: House Home Access: Level entry     Home Layout: One level Home Equipment: Walker - 2 wheels;Grab bars - tub/shower      Prior Function Level of Independence: Independent with assistive device(s)         Comments: Hydrographic surveyor, drives     Hand Dominance   Dominant Hand: Right    Extremity/Trunk Assessment   Upper Extremity Assessment  Upper Extremity Assessment: Overall WFL for tasks assessed    Lower Extremity Assessment Lower Extremity  Assessment: Overall WFL for tasks assessed;LLE deficits/detail LLE Deficits / Details: grossly -4/5 LLE: Unable to fully assess due to pain LLE Sensation: WNL LLE Coordination: WNL    Cervical / Trunk Assessment Cervical / Trunk Assessment: Normal  Communication   Communication: No difficulties  Cognition Arousal/Alertness: Awake/alert Behavior During Therapy: WFL for tasks assessed/performed Overall Cognitive Status: Within Functional Limits for tasks assessed                                        General Comments      Exercises Total Joint Exercises Ankle Circles/Pumps: Supine;5 reps;Left;Strengthening;AROM Quad Sets: Supine;5 reps;Left;Strengthening;AROM Gluteal Sets: Supine;5 reps;Both;Strengthening;AROM Short Arc Quad: Supine;5 reps;Left;AROM;Strengthening Heel Slides: Supine;5 reps;Left;Strengthening;AROM Goniometric ROM: left knee: 0 - 115 degrees   Assessment/Plan    PT Assessment Patient needs continued PT services  PT Problem List Decreased strength;Decreased activity tolerance;Decreased range of motion;Decreased balance;Decreased mobility       PT Treatment Interventions DME instruction;Gait training;Stair training;Functional mobility training;Therapeutic activities;Therapeutic exercise;Patient/family education;Balance training    PT Goals (Current goals can be found in the Care Plan section)  Acute Rehab PT Goals Patient Stated Goal: return home with friends/neighbors to assist PT Goal Formulation: With patient Time For Goal Achievement: 05/30/20 Potential to Achieve Goals: Good    Frequency BID   Barriers to discharge        Co-evaluation               AM-PAC PT "6 Clicks" Mobility  Outcome Measure Help needed turning from your back to your side while in a flat bed without using bedrails?: None Help needed moving from lying on your back to sitting on the side of a flat bed without using bedrails?: A Little Help needed moving  to and from a bed to a chair (including a wheelchair)?: A Little Help needed standing up from a chair using your arms (e.g., wheelchair or bedside chair)?: A Little Help needed to walk in hospital room?: A Little Help needed climbing 3-5 steps with a railing? : A Lot 6 Click Score: 18    End of Session   Activity Tolerance: Patient tolerated treatment well;Patient limited by fatigue Patient left: in chair;with call bell/phone within reach;with nursing/sitter in room Nurse Communication: Mobility status PT Visit Diagnosis: Unsteadiness on feet (R26.81);Other abnormalities of gait and mobility (R26.89);Muscle weakness (generalized) (M62.81)    Time: 1025-8527 PT Time Calculation (min) (ACUTE ONLY): 32 min   Charges:   PT Evaluation $PT Eval Moderate Complexity: 1 Mod PT Treatments $Therapeutic Activity: 23-37 mins        3:41 PM, 05/28/20 Lonell Grandchild, MPT Physical Therapist with [facility] [phone] office [phone] mobile phone

## 2020-05-28 NOTE — Anesthesia Preprocedure Evaluation (Signed)
Anesthesia Evaluation  Patient identified by MRN, date of birth, ID band Patient awake    Reviewed: Allergy & Precautions, H&P , NPO status , Patient's Chart, lab work & pertinent test results, reviewed documented beta blocker date and time   Airway Mallampati: II  TM Distance: >3 FB Neck ROM: full    Dental no notable dental hx.    Pulmonary neg pulmonary ROS,    Pulmonary exam normal breath sounds clear to auscultation       Cardiovascular Exercise Tolerance: Good hypertension, negative cardio ROS   Rhythm:regular Rate:Normal     Neuro/Psych PSYCHIATRIC DISORDERS Anxiety  Neuromuscular disease    GI/Hepatic negative GI ROS, Neg liver ROS,   Endo/Other  diabetesMorbid obesity  Renal/GU negative Renal ROS  negative genitourinary   Musculoskeletal   Abdominal   Peds  Hematology negative hematology ROS (+)   Anesthesia Other Findings   Reproductive/Obstetrics negative OB ROS                             Anesthesia Physical Anesthesia Plan  ASA: III  Anesthesia Plan: Spinal   Post-op Pain Management:  Regional for Post-op pain   Induction:   PONV Risk Score and Plan: Propofol infusion  Airway Management Planned:   Additional Equipment:   Intra-op Plan:   Post-operative Plan:   Informed Consent: I have reviewed the patients History and Physical, chart, labs and discussed the procedure including the risks, benefits and alternatives for the proposed anesthesia with the patient or authorized representative who has indicated his/her understanding and acceptance.     Dental Advisory Given  Plan Discussed with: CRNA  Anesthesia Plan Comments:         Anesthesia Quick Evaluation

## 2020-05-28 NOTE — Anesthesia Procedure Notes (Signed)
Date/Time: 05/28/2020 7:48 AM Performed by: Vista Deck, CRNA Pre-anesthesia Checklist: Patient identified, Emergency Drugs available, Suction available, Timeout performed and Patient being monitored Patient Re-evaluated:Patient Re-evaluated prior to induction Oxygen Delivery Method: Nasal Cannula

## 2020-05-28 NOTE — Brief Op Note (Signed)
05/28/2020  9:51 AM  PATIENT:  Pamela Stein  65 y.o. female  PRE-OPERATIVE DIAGNOSIS:  Left knee osteoarthritis  POST-OPERATIVE DIAGNOSIS:  Left knee osteoarthritis  PROCEDURE:  Procedure(s): LEFT TOTAL KNEE ARTHROPLASTY (Left)   Implants De[employer]uy [employer] attune posterior stabilized fixed-bearing total knee Size 6 femur Size 6 tibia Size 5 poly- Size 35 x 8.5 patella  Bone cuts Distal femur 10 Proximal tibia 9 reference lateral plateau Patella 20 cut down to 12  Assisted by [facility] and [facility]  Anesthesia spinal with postop PACU abductor canal block  25 cc half percent Marcaine with epinephrine injected along with Zynrelef 2 vials  SURGEON:  Surgeon(s) and Role:    Carole Civil, MD - Primary  PHYSICIAN ASSISTANT:   EBL:  10 mL   BLOOD ADMINISTERED:none  DRAINS: none     SPECIMEN:  No Specimen  DISPOSITION OF SPECIMEN:  N/A  COUNTS:  YES  TOURNIQUET:   Total Tourniquet Time Documented: Thigh (Left) - 87 minutes Total: Thigh (Left) - 87 minutes   DICTATION: .Viviann Spare Dictation  PLAN OF CARE: Admit for overnight observation  PATIENT DISPOSITION:  PACU - hemodynamically stable.   Delay start of Pharmacological VTE agent (>24hrs) due to surgical blood loss or risk of bleeding: yes   Details of the surgery  The patient was seen in preop evaluated and cleared for surgery.  Surgical site confirmed left knee marked as such.  Chart review and x-rays reviewed.  Patient taken to the operating for spinal anesthesia.  She had a Foley catheter inserted.  She was in supine position.  We placed a tourniquet on her left thigh.  She was prepped and draped sterilely.  Timeout was completed.  Limb was exsanguinated with a 6 inch Esmarch tourniquet was inflated to 280 mmHg.  A midline incision was made the subcutaneous tissue was divided a medial arthrotomy was performed the patella was everted the patella fat pad was excised the  patellofemoral ligament was released the medial soft tissue sleeve was elevated the meniscus was resected soft tissue sleeve was elevated to the mid coronal plane.  The ACL and PCL were resected the knee was brought forward.  Drill was placed into the center of the femoral canal.  The distal femoral cut was set for 5 degree left with a 10 mm distal resection referencing from the prominent medial femoral condyle  This cut was made and then checked for flatness.  The femur measured a size 6.  We then turned our attention to the tibia.  The remaining meniscal structures were removed the soft tissue and the notch was further dissected free.  We made a proximal tibial resection of 9 mm referencing from the higher lateral side.  This was made with a neutral cut for anatomic and mechanical axis.  Bone landmarks including medial malleolus I lateral tibial spine tibial tubercle aiming to not to internally rotate the prosthesis or the cut  After this cut was made we measured the tibia to be a size 6  We then checked the extension gap with a 5 mm spacer block the knee was balanced in extension  We then pinned our distal cutting block checked the posterior condylar cut before making the cut.  A 5 mm spacer block fit there well as well.  The 4 cuts on the distal femur were made followed by 6 notch cutting block.  After this cut was made a trial reduction was performed with a 6 femur  6 tibia and 5 mm insert.  We got excellent range of motion passive flexion test was 110 full extension balanced in extension balanced in flexion  Tibial proximal portion was prepared per manufacture technique  The patella only measured 20 mm we cut it down to 12 and then used a 35 button which was 8.5 mm thick  We irrigated the knee dried all the bone cement of the implants and placed and then placed the 5 mm insert.  Passive range of motion test matched trial reduction and then we made sure everything was dry and then  injected 25 cc of Marcaine including the posterior capsule and then we put in Zynrelef and then closed the extensor mechanism and capsule with #1 Braylon in interrupted fashion you leaving a window to place the second vial of Zynrelef  We closed the subcu with interrupted and running 0 Monocryl suture and then applied staples  The patient had the tourniquet released and then sterile dressing was applied with a Cryo/Cuff and an Ace bandage in preparation for postop abductor canal block

## 2020-05-28 NOTE — Op Note (Signed)
05/28/2020  9:51 AM  PATIENT:  Pamela Stein  65 y.o. female  PRE-OPERATIVE DIAGNOSIS:  Left knee osteoarthritis  POST-OPERATIVE DIAGNOSIS:  Left knee osteoarthritis  PROCEDURE:  Procedure(s): LEFT TOTAL KNEE ARTHROPLASTY (Left)   Implants De[employer]uy [employer] attune posterior stabilized fixed-bearing total knee Size 6 femur Size 6 tibia Size 5 poly- Size 35 x 8.5 patella  Bone cuts Distal femur 10 Proximal tibia 9 reference lateral plateau Patella 20 cut down to 12  Assisted by Fulton Mole and Kahi Mohala  Anesthesia spinal with postop PACU abductor canal block  25 cc half percent Marcaine with epinephrine injected along with Zynrelef 2 vials  SURGEON:  Surgeon(s) and Role:    Carole Civil, MD - Primary  PHYSICIAN ASSISTANT:   EBL:  10 mL   BLOOD ADMINISTERED:none  DRAINS: none     SPECIMEN:  No Specimen  DISPOSITION OF SPECIMEN:  N/A  COUNTS:  YES  TOURNIQUET:   Total Tourniquet Time Documented: Thigh (Left) - 87 minutes Total: Thigh (Left) - 87 minutes   DICTATION: .Viviann Spare Dictation  PLAN OF CARE: Admit for overnight observation  PATIENT DISPOSITION:  PACU - hemodynamically stable.   Delay start of Pharmacological VTE agent (>24hrs) due to surgical blood loss or risk of bleeding: yes   Details of the surgery  The patient was seen in preop evaluated and cleared for surgery.  Surgical site confirmed left knee marked as such.  Chart review and x-rays reviewed.  Patient taken to the operating for spinal anesthesia.  She had a Foley catheter inserted.  She was in supine position.  We placed a tourniquet on her left thigh.  She was prepped and draped sterilely.  Timeout was completed.  Limb was exsanguinated with a 6 inch Esmarch tourniquet was inflated to 280 mmHg.  A midline incision was made the subcutaneous tissue was divided a medial arthrotomy was performed the patella was everted the patella fat pad was excised the  patellofemoral ligament was released the medial soft tissue sleeve was elevated the meniscus was resected soft tissue sleeve was elevated to the mid coronal plane.  The ACL and PCL were resected the knee was brought forward.  Drill was placed into the center of the femoral canal.  The distal femoral cut was set for 5 degree left with a 10 mm distal resection referencing from the prominent medial femoral condyle  This cut was made and then checked for flatness.  The femur measured a size 6.  We then turned our attention to the tibia.  The remaining meniscal structures were removed the soft tissue and the notch was further dissected free.  We made a proximal tibial resection of 9 mm referencing from the higher lateral side.  This was made with a neutral cut for anatomic and mechanical axis.  Bone landmarks including medial malleolus I lateral tibial spine tibial tubercle aiming to not to internally rotate the prosthesis or the cut  After this cut was made we measured the tibia to be a size 6  We then checked the extension gap with a 5 mm spacer block the knee was balanced in extension  We then pinned our distal cutting block checked the posterior condylar cut before making the cut.  A 5 mm spacer block fit there well as well.  The 4 cuts on the distal femur were made followed by 6 notch cutting block.  After this cut was made a trial reduction was performed with a 6 femur  6 tibia and 5 mm insert.  We got excellent range of motion passive flexion test was 110 full extension balanced in extension balanced in flexion  Tibial proximal portion was prepared per manufacture technique  The patella only measured 20 mm we cut it down to 12 and then used a 35 button which was 8.5 mm thick  We irrigated the knee dried all the bone cement of the implants and placed and then placed the 5 mm insert.  Passive range of motion test matched trial reduction and then we made sure everything was dry and then  injected 25 cc of Marcaine including the posterior capsule and then we put in Zynrelef and then closed the extensor mechanism and capsule with #1 Braylon in interrupted fashion you leaving a window to place the second vial of Zynrelef  We closed the subcu with interrupted and running 0 Monocryl suture and then applied staples  The patient had the tourniquet released and then sterile dressing was applied with a Cryo/Cuff and an Ace bandage in preparation for postop abductor canal block

## 2020-05-28 NOTE — Anesthesia Procedure Notes (Addendum)
Spinal  Patient location during procedure: OR Start time: 05/28/2020 7:33 AM End time: 05/28/2020 7:45 AM Reason for block: surgical anesthesia Staffing Anesthesiologist: Louann Sjogren, MD Resident/CRNA: Vista Deck, CRNA Preanesthetic Checklist Completed: patient identified, IV checked, site marked, risks and benefits discussed, surgical consent, monitors and equipment checked, pre-op evaluation and timeout performed Spinal Block Patient position: sitting Prep: Betadine Approach: midline Location: L2-3 Injection technique: single-shot Needle Needle type: Quincke  Needle gauge: 22 G Needle length: 12.7 cm Assessment Sensory level: T8 Additional Notes CLEAR CSF PRE AND POST INJECTION  TRAY # GTIN: [id number] LOT #[id number] Expiration: 2022-08-09 3 attempts made by Drucie Opitz CRNA with [phone] [place] without success. Dr. Briant Cedar in and successful insertion as above, 2 attempts.

## 2020-05-28 NOTE — Plan of Care (Signed)
  Problem: Acute Rehab PT Goals(only PT should resolve) Goal: Pt Will Go Supine/Side To Sit Outcome: Progressing Flowsheets (Taken 05/28/2020 1542) Pt will go Supine/Side to Sit:  Independently  with modified independence Goal: Patient Will Transfer Sit To/From Stand Outcome: Progressing Flowsheets (Taken 05/28/2020 1542) Patient will transfer sit to/from stand: with modified independence Goal: Pt Will Transfer Bed To Chair/Chair To Bed Outcome: Progressing Flowsheets (Taken 05/28/2020 1542) Pt will Transfer Bed to Chair/Chair to Bed: with modified independence Goal: Pt Will Ambulate Outcome: Progressing Flowsheets (Taken 05/28/2020 1542) Pt will Ambulate:  > 125 feet  with supervision  with modified independence  with rolling walker   3:43 PM, 05/28/20 Lonell Grandchild, MPT Physical Therapist with [facility] 336 [phone] office [phone] mobile phone

## 2020-05-29 ENCOUNTER — Encounter (HOSPITAL_COMMUNITY): Payer: Self-pay | Admitting: Orthopedic Surgery

## 2020-05-29 LAB — BASIC METABOLIC PANEL
Anion gap: 5 (ref 5–15)
BUN: 23 mg/dL (ref 8–23)
CO2: 27 mmol/L (ref 22–32)
Calcium: 9.2 mg/dL (ref 8.9–10.3)
Chloride: 105 mmol/L (ref 98–111)
Creatinine, Ser: 1.38 mg/dL — ABNORMAL HIGH (ref 0.44–1.00)
GFR, Estimated: 42 mL/min — ABNORMAL LOW (ref >60)
Glucose, Bld: 165 mg/dL — ABNORMAL HIGH (ref 70–99)
Potassium: 4.5 mmol/L (ref 3.5–5.1)
Sodium: 137 mmol/L (ref 135–145)

## 2020-05-29 LAB — CBC
HCT: 34.8 % — ABNORMAL LOW (ref 36.0–46.0)
Hemoglobin: 10.5 g/dL — ABNORMAL LOW (ref 12.0–15.0)
MCH: 22.1 pg — ABNORMAL LOW (ref 26.0–34.0)
MCHC: 30.2 g/dL (ref 30.0–36.0)
MCV: 73.1 fL — ABNORMAL LOW (ref 80.0–100.0)
Platelets: 252 10*3/uL (ref 150–400)
RBC: 4.76 MIL/uL (ref 3.87–5.11)
RDW: 17.5 % — ABNORMAL HIGH (ref 11.5–15.5)
WBC: 10.4 10*3/uL (ref 4.0–10.5)
nRBC: 0 % (ref 0.0–0.2)

## 2020-05-29 LAB — GLUCOSE, CAPILLARY
Glucose-Capillary: 163 mg/dL — ABNORMAL HIGH (ref 70–99)
Glucose-Capillary: 144 mg/dL — ABNORMAL HIGH (ref 70–99)
Glucose-Capillary: 108 mg/dL — ABNORMAL HIGH (ref 70–99)
Glucose-Capillary: 116 mg/dL — ABNORMAL HIGH (ref 70–99)

## 2020-05-29 MED ORDER — INSULIN ASPART 100 UNIT/ML ~~LOC~~ SOLN
0.0000 [IU] | Freq: Three times a day (TID) | SUBCUTANEOUS | Status: DC
  Administered 2020-05-29: 1 [IU] via SUBCUTANEOUS
  Administered 2020-05-30: 7 [IU] via SUBCUTANEOUS
  Administered 2020-05-31: 1 [IU] via SUBCUTANEOUS
  Administered 2020-05-31 – 2020-06-01 (×2): 2 [IU] via SUBCUTANEOUS
  Administered 2020-06-01: 1 [IU] via SUBCUTANEOUS

## 2020-05-29 NOTE — Progress Notes (Signed)
Bloody drainage noted from dressing to left knee dressing removed no bleeding noted at the sight MD informed.

## 2020-05-29 NOTE — Progress Notes (Signed)
Physical Therapy Treatment Patient Details Name: Pamela Stein MRN: [id number] DOB: [birth year] Today's Date: 05/29/2020  LEFT KNEE ROM:0 - 115degrees AMBULATION DISTANCE:173feet using RW with Min guard assist  History of Present Illness Pamela Stein is a 65 y/o female, s/p Left TKA on 05/28/20  with the diagnosis of Left knee osteoarthritis.    PT Comments    Patient demonstrates increased endurance/distance for ambulation with less c/o heaviness in LLE, had occasional buckling of knees once fatigued and required rest break sitting in w/c before returning to room.  Patient tolerated staying up in chair after therapy - nursing staff aware.  Patient will benefit from continued physical therapy in hospital and recommended venue below to increase strength, balance, endurance for safe ADLs and gait.   Follow Up Recommendations  Home health PT;Supervision for mobility/OOB;Supervision - Intermittent     Equipment Recommendations  None recommended by PT    Recommendations for Other Services       Precautions / Restrictions Precautions Precautions: Fall Restrictions Weight Bearing Restrictions: Yes LLE Weight Bearing: Weight bearing as tolerated    Mobility  Bed Mobility               General bed mobility comments: Patient presents up in chair    Transfers Overall transfer level: Needs assistance Equipment used: Rolling walker (2 wheeled) Transfers: Sit to/from Stand;Stand Pivot Transfers Sit to Stand: Supervision;Min guard Stand pivot transfers: Supervision;Min guard       General transfer comment: slightly labored movement, increased time  Ambulation/Gait Ambulation/Gait assistance: Min guard Gait Distance (Feet): 100 Feet Assistive device: Rolling walker (2 wheeled) Gait Pattern/deviations: Decreased step length - left;Decreased stance time - left;Antalgic;Decreased stride length Gait velocity: decreased   General Gait Details: slightly  improvement in balance with less c/o heaviness in LLE, required verbal cues to push RW instead of picking up with good carryover, fair return for left heel to toe stepping, limited secondary to fatigue   Stairs             Wheelchair Mobility    Modified Rankin (Stroke Patients Only)       Balance Overall balance assessment: Needs assistance Sitting-balance support: Feet supported;No upper extremity supported Sitting balance-Leahy Scale: Good Sitting balance - Comments: seated in chair   Standing balance support: During functional activity;Bilateral upper extremity supported Standing balance-Leahy Scale: Fair Standing balance comment: fair/good using RW                            Cognition Arousal/Alertness: Awake/alert Behavior During Therapy: WFL for tasks assessed/performed Overall Cognitive Status: Within Functional Limits for tasks assessed                                        Exercises Total Joint Exercises Ankle Circles/Pumps: Seated;AROM;Strengthening;10 reps;Both Quad Sets: Left;Strengthening;AROM;10 reps;Seated Short Arc Quad: Left;AROM;Strengthening;Seated;10 reps Heel Slides: Left;Strengthening;AROM;10 reps;Seated Goniometric ROM: left knee: 0 - 115 degrees    General Comments        Pertinent Vitals/Pain Pain Score: 3  Pain Location: left knee Pain Descriptors / Indicators: Sore Pain Intervention(s): Limited activity within patient's tolerance;Repositioned    Home Living                      Prior Function            PT Goals (  current goals can now be found in the care plan section) Acute Rehab PT Goals Patient Stated Goal: return home with friends/neighbors to assist PT Goal Formulation: With patient Time For Goal Achievement: 05/30/20 Potential to Achieve Goals: Good Progress towards PT goals: Progressing toward goals    Frequency    BID      PT Plan Current plan remains appropriate     Co-evaluation              AM-PAC PT "6 Clicks" Mobility   Outcome Measure  Help needed turning from your back to your side while in a flat bed without using bedrails?: None Help needed moving from lying on your back to sitting on the side of a flat bed without using bedrails?: A Little Help needed moving to and from a bed to a chair (including a wheelchair)?: A Little Help needed standing up from a chair using your arms (e.g., wheelchair or bedside chair)?: A Little Help needed to walk in hospital room?: A Little Help needed climbing 3-5 steps with a railing? : Total 6 Click Score: 17    End of Session   Activity Tolerance: Patient tolerated treatment well;Patient limited by fatigue Patient left: in chair;with call bell/phone within reach Nurse Communication: Mobility status PT Visit Diagnosis: Unsteadiness on feet (R26.81);Other abnormalities of gait and mobility (R26.89);Muscle weakness (generalized) (M62.81)     Time: 2774-1287 PT Time Calculation (min) (ACUTE ONLY): 29 min  Charges:  $Gait Training: 8-22 mins $Therapeutic Exercise: 8-22 mins                     4:07 PM, 05/29/20 Lonell Grandchild, MPT Physical Therapist with [facility] 336 [phone] office [phone] mobile phone

## 2020-05-29 NOTE — Progress Notes (Signed)
Physical Therapy Treatment Patient Details Name: Pamela Stein MRN: [id number] DOB: Jul 09, 1955 Today's Date: 05/29/2020  LEFT KNEE ROM:  0 - 115 degrees AMBULATION DISTANCE: 1 feet using RW with Min guard assist  History of Present Illness Pamela Stein is a 65 y/o female, s/p Left TKA on 05/28/20  with the diagnosis of Left knee osteoarthritis.    PT Comments    Patient states she slipped of side of bed and had to be lifted off floor by nursing staff earlier in am - confirmed by nursing staff.  Patient pain level at baseline with no injuries noted.  Patient unable to climb beyond 1 step using bilateral side rails due to BLE giving way due to weakness, had loss of balance and required wheelchair pulled up for her to sit to avoid falling (patient does not have steps at home).  Patient demonstrates slow labored cadence with c/o of leg feeling heavy with occasional buckling of left knee due to weakness, followed with w/c for safety and had to have sitting rest break before walking back to room.  Informed patient to contact her neighbors who she states will help her to confirm that they can provide the level of assistance that she is receiving in hospital which is Min guard assist for transfers and short distanced household gait, supervision for all mobility.    Follow Up Recommendations  Home health PT;Supervision for mobility/OOB;Supervision - Intermittent     Equipment Recommendations  None recommended by PT    Recommendations for Other Services       Precautions / Restrictions Precautions Precautions: Fall Restrictions Weight Bearing Restrictions: Yes LLE Weight Bearing: Weight bearing as tolerated    Mobility  Bed Mobility Overal bed mobility: Needs Assistance Bed Mobility: Supine to Sit;Sit to Supine     Supine to sit: Modified independent (Device/Increase time);Supervision Sit to supine: Modified independent (Device/Increase time);Supervision   General bed  mobility comments: good return for moving LLE and propping up on elbows without use of bed rail during bed mobility    Transfers Overall transfer level: Needs assistance Equipment used: Rolling walker (2 wheeled) Transfers: Sit to/from [employer] Transfers Sit to Stand: Supervision;Min guard Stand pivot transfers: Supervision;Min guard       General transfer comment: increased time, labored movement, c/o LLE giving way and feeling heavy  Ambulation/Gait Ambulation/Gait assistance: Min guard Gait Distance (Feet): 75 Feet Assistive device: Rolling walker (2 wheeled) Gait Pattern/deviations: Decreased step length - left;Decreased stance time - left;Antalgic;Decreased stride length Gait velocity: decreased   General Gait Details: slow labored cadence with occasional buckling of left knee due to c/o weakness/heaviness, followed with w/c for safety, required sitting rest break before walking back to room   Stairs Stairs: Yes   Stair Management: Two rails;Step to pattern Number of Stairs: 1 General stair comments: Patient unable to climb more than 1 steps due to inability to support body weight on RLE due to generalized weakness, had to pull up wheelchair for patient sit down due to loss of balance   Wheelchair Mobility    Modified Rankin (Stroke Patients Only)       Balance Overall balance assessment: Needs assistance Sitting-balance support: Feet supported;No upper extremity supported Sitting balance-Pamela Stein Scale: Good Sitting balance - Comments: seated at EOB   Standing balance support: During functional activity;Bilateral upper extremity supported Standing balance-Pamela Stein Scale: Fair Standing balance comment: using RW  Cognition Arousal/Alertness: Awake/alert Behavior During Therapy: WFL for tasks assessed/performed Overall Cognitive Status: Within Functional Limits for tasks assessed                                         Exercises Total Joint Exercises Ankle Circles/Pumps: Seated;AROM;Strengthening;10 reps;Both Quad Sets: Supine;Left;Strengthening;AROM;10 reps Heel Slides: Supine;Left;Strengthening;AROM;10 reps Long Arc Quad: Seated;AROM;Strengthening;Both;10 reps Knee Flexion: Seated;AROM;Strengthening;Both;10 reps Goniometric ROM: left knee: 0 - 115 degrees    General Comments        Pertinent Vitals/Pain Pain Assessment: 0-10 Pain Score: 3  Pain Location: left knee Pain Descriptors / Indicators: Sore Pain Intervention(s): Limited activity within patient's tolerance;Monitored during session;RN gave pain meds during session;Repositioned    Home Living                      Prior Function            PT Goals (current goals can now be found in the care plan section) Acute Rehab PT Goals Patient Stated Goal: return home with friends/neighbors to assist PT Goal Formulation: With patient Time For Goal Achievement: 05/30/20 Potential to Achieve Goals: Fair Progress towards PT goals: Progressing toward goals    Frequency    BID      PT Plan Current plan remains appropriate    Co-evaluation              AM-PAC PT "6 Clicks" Mobility   Outcome Measure  Help needed turning from your back to your side while in a flat bed without using bedrails?: None Help needed moving from lying on your back to sitting on the side of a flat bed without using bedrails?: A Little Help needed moving to and from a bed to a chair (including a wheelchair)?: A Little Help needed standing up from a chair using your arms (e.g., wheelchair or bedside chair)?: A Little Help needed to walk in hospital room?: A Little Help needed climbing 3-5 steps with a railing? : Total 6 Click Score: 17    End of Session   Activity Tolerance: Patient tolerated treatment well;Patient limited by fatigue Patient left: in chair;with call bell/phone within reach;with chair alarm set Nurse  Communication: Mobility status PT Visit Diagnosis: Unsteadiness on feet (R26.81);Other abnormalities of gait and mobility (R26.89);Muscle weakness (generalized) (M62.81)     Time: 5056-9794 PT Time Calculation (min) (ACUTE ONLY): 35 min  Charges:  $Gait Training: 8-22 mins $Therapeutic Exercise: 8-22 mins                     10:54 AM, 05/29/20 Lonell Grandchild, MPT Physical Therapist with [facility] [phone] office [phone] mobile phone

## 2020-05-29 NOTE — Progress Notes (Signed)
Patient ID: Pamela Stein, female   DOB: October 05, 1955, 65 y.o.   MRN: [id number] Postop day 1 status post left total knee  Patient's glucose is 165  Patient placed on sliding scale  Patient's pain is well controlled  Neurovascular exam is intact, calf is supple  Recommend physical therapy to continue progress towards ability to climb the steps  Patient can go home when she can do that.

## 2020-05-30 LAB — CBC
HCT: 34.8 % — ABNORMAL LOW (ref 36.0–46.0)
Hemoglobin: 10.3 g/dL — ABNORMAL LOW (ref 12.0–15.0)
MCH: 21.5 pg — ABNORMAL LOW (ref 26.0–34.0)
MCHC: 29.6 g/dL — ABNORMAL LOW (ref 30.0–36.0)
MCV: 72.8 fL — ABNORMAL LOW (ref 80.0–100.0)
Platelets: 241 10*3/uL (ref 150–400)
RBC: 4.78 MIL/uL (ref 3.87–5.11)
RDW: 17.4 % — ABNORMAL HIGH (ref 11.5–15.5)
WBC: 13 10*3/uL — ABNORMAL HIGH (ref 4.0–10.5)
nRBC: 0 % (ref 0.0–0.2)

## 2020-05-30 LAB — GLUCOSE, CAPILLARY
Glucose-Capillary: 308 mg/dL — ABNORMAL HIGH (ref 70–99)
Glucose-Capillary: 116 mg/dL — ABNORMAL HIGH (ref 70–99)
Glucose-Capillary: 114 mg/dL — ABNORMAL HIGH (ref 70–99)
Glucose-Capillary: 150 mg/dL — ABNORMAL HIGH (ref 70–99)

## 2020-05-30 MED ORDER — ASPIRIN 81 MG PO CHEW: 81.0000 mg | CHEWABLE_TABLET | Freq: Two times a day (BID) | ORAL | 0 refills | Status: AC

## 2020-05-30 MED ORDER — HYDROCODONE-ACETAMINOPHEN 5-325 MG PO TABS: 1.0000 | ORAL_TABLET | ORAL | 0 refills | Status: DC | PRN

## 2020-05-30 MED ORDER — DOCUSATE SODIUM 100 MG PO CAPS: 100.0000 mg | ORAL_CAPSULE | Freq: Two times a day (BID) | ORAL | 0 refills | Status: DC

## 2020-05-30 MED ORDER — NALOXONE HCL 0.4 MG/ML IJ SOLN
0.4000 mg | Freq: Once | INTRAMUSCULAR | Status: AC
  Administered 2020-05-30: 0.4 mg via INTRAMUSCULAR
  Filled 2020-05-30: qty 1

## 2020-05-30 NOTE — Progress Notes (Addendum)
Physical Therapy Treatment Patient Details Name: Pamela Stein MRN: [id number] DOB: April 11, 1955 Today's Date: 05/30/2020  LEFT KNEE ROM:0 - 115degrees AMBULATION DISTANCE:7feet using RW with Min/Mod assist   History of Present Illness Pamela Stein is a 65 y/o female, s/p Left TKA on 05/28/20  with the diagnosis of Left knee osteoarthritis.    PT Comments    Patient presents slightly lethargic and states that her, "pain medications are making me feel loopy."  Patient requires increased time with labored movement to sit up at bedside, very unsteady on feet with frequent near loss of balance with hands slipping off RW when taking steps and limited to a few side steps at bedside due to fall risk.  Patient sat up in chair for a few minutes, but constantly attempts to stand up by herself and put back to bed due to safety concerns - RN, charge nurse notified.  Patient will benefit from continued physical therapy in hospital and recommended venue below to increase strength, balance, endurance for safe ADLs and gait.   Follow Up Recommendations  Supervision for mobility/OOB;SNF;Supervision/Assistance - 24 hour     Equipment Recommendations  None recommended by PT    Recommendations for Other Services       Precautions / Restrictions Precautions Precautions: Fall Restrictions Weight Bearing Restrictions: Yes LLE Weight Bearing: Weight bearing as tolerated    Mobility  Bed Mobility Overal bed mobility: Needs Assistance Bed Mobility: Supine to Sit;Sit to Supine     Supine to sit: Supervision;Min guard Sit to supine: Supervision;Min guard   General bed mobility comments: increased time, slow labored movement    Transfers Overall transfer level: Needs assistance Equipment used: Rolling walker (2 wheeled) Transfers: Sit to/from [employer] Sit to Stand: Min guard Stand pivot transfers: Min assist;Mod assist       General transfer comment: very  unsteady on feet with hands frequently slipping off RW due to lethargy  Ambulation/Gait Ambulation/Gait assistance: Min assist;Mod assist Gait Distance (Feet): 4 Feet Assistive device: Rolling walker (2 wheeled) Gait Pattern/deviations: Decreased step length - left;Decreased stance time - left;Antalgic;Decreased stride length Gait velocity: decreased   General Gait Details: limited to 4-5 slow labored unsteady steps at bedside due to frequent loss of balance with hands slipping off RW   Stairs             Wheelchair Mobility    Modified Rankin (Stroke Patients Only)       Balance Overall balance assessment: Needs assistance Sitting-balance support: Feet supported;No upper extremity supported Sitting balance-Leahy Scale: Fair Sitting balance - Comments: fair/good seated at EOB   Standing balance support: During functional activity;Bilateral upper extremity supported Standing balance-Leahy Scale: Fair Standing balance comment: fair/poor using RW                            Cognition Arousal/Alertness: Awake/alert Behavior During Therapy: WFL for tasks assessed/performed;Impulsive Overall Cognitive Status: Within Functional Limits for tasks assessed                                 General Comments: appears slightly lethargic requiring occasional repeated verbal cues to complete functional tasks      Exercises Total Joint Exercises Ankle Circles/Pumps: Supine;10 reps;Both;Strengthening;AROM Quad Sets: Supine;10 reps;Left;AROM Heel Slides: Supine;10 reps;Left;Strengthening;AROM Goniometric ROM: left knee: 0 - 115 degrees    General Comments  Pertinent Vitals/Pain Pain Assessment: 0-10 Pain Score: 3  Pain Location: left knee Pain Descriptors / Indicators: Sore Pain Intervention(s): Limited activity within patient's tolerance;Monitored during session    Home Living                      Prior Function            PT  Goals (current goals can now be found in the care plan section) Acute Rehab PT Goals Patient Stated Goal: return home with friends/neighbors to assist PT Goal Formulation: With patient Time For Goal Achievement: 05/30/20 Potential to Achieve Goals: Good Progress towards PT goals: Progressing toward goals    Frequency    BID      PT Plan Discharge plan needs to be updated    Co-evaluation              AM-PAC PT "6 Clicks" Mobility   Outcome Measure  Help needed turning from your back to your side while in a flat bed without using bedrails?: None Help needed moving from lying on your back to sitting on the side of a flat bed without using bedrails?: A Little Help needed moving to and from a bed to a chair (including a wheelchair)?: A Lot Help needed standing up from a chair using your arms (e.g., wheelchair or bedside chair)?: A Little Help needed to walk in hospital room?: A Lot Help needed climbing 3-5 steps with a railing? : Total 6 Click Score: 15    End of Session   Activity Tolerance: Patient tolerated treatment well;Patient limited by fatigue Patient left: in bed;with call bell/phone within reach Nurse Communication: Mobility status PT Visit Diagnosis: Unsteadiness on feet (R26.81);Other abnormalities of gait and mobility (R26.89);Muscle weakness (generalized) (M62.81)     Time: 9450-3888 PT Time Calculation (min) (ACUTE ONLY): 21 min  Charges:  $Therapeutic Exercise: 8-22 mins $Therapeutic Activity: 8-22 mins                     11:09 AM, 05/30/20 Lonell Grandchild, MPT Physical Therapist with [facility] [phone] office [phone] mobile phone

## 2020-05-30 NOTE — TOC Progression Note (Signed)
Transition of Care [facility]) - Progression Note    Patient Details  Name: Pamela Stein MRN: [id number] Date of Birth: 1955-09-19  Transition of Care [facility]) CM/SW Contact  Ihor Gully, LCSW Phone Number: 05/30/2020, 4:47 PM  Clinical Narrative:    Patient's insurance does not have skilled nursing benefit. Patient and Dr. Aline Brochure advised. Patient states she will go home with already arranged [facility]. HH arranged with [facility].    Expected Discharge Plan: Skilled Nursing Facility Barriers to Discharge: Barriers Resolved  Expected Discharge Plan and Services Expected Discharge Plan: [place]         Expected Discharge Date: 05/30/20               DME Arranged: N/A DME Agency: NA       HH Arranged: PT,OT [place] Agency: [facility] [employer]) Date [place]: 05/30/20 Time [place]: 1007 Representative spoke with at [place]: Jannett Celestine.   Social Determinants of Health (SDOH) Interventions    Readmission Risk Interventions No flowsheet data found.

## 2020-05-30 NOTE — Progress Notes (Signed)
Subjective: 2 Days Post-Op Procedure(s) (LRB): LEFT TOTAL KNEE ARTHROPLASTY (Left) Patient reports pain as mild.    Objective: Vital signs in last 24 hours: Temp:  [98.6 F (37 C)-100.4 F (38 C)] 100.4 F (38 C) (04/21 0544) Pulse Rate:  [67-93] 93 (04/21 0544) Resp:  [16-20] 18 (04/21 0544) BP: (114-141)/(57-78) 129/61 (04/21 0544) SpO2:  [95 %-99 %] 96 % (04/21 0544)  Intake/Output from previous day: 04/20 0701 - 04/21 0700 In: 580 [P.O.:480; IV Piggyback:100] Out: -  Intake/Output this shift: No intake/output data recorded.  Recent Labs    05/29/20 0609 05/30/20 0525  HGB 10.5* 10.3*   Recent Labs    05/29/20 0609 05/30/20 0525  WBC 10.4 13.0*  RBC 4.76 4.78  HCT 34.8* 34.8*  PLT 252 241   Recent Labs    05/29/20 0609  NA 137  K 4.5  CL 105  CO2 27  BUN 23  CREATININE 1.38*  GLUCOSE 165*  CALCIUM 9.2   No results for input(s): LABPT, INR in the last 72 hours.  Neurologically intact Neurovascular intact Sensation intact distally Intact pulses distally Dorsiflexion/Plantar flexion intact Compartment soft   Assessment/Plan: 2 Days Post-Op Procedure(s) (LRB): LEFT TOTAL KNEE ARTHROPLASTY (Left) D/C IV fluids Discharge home with home health   Anticipated LOS equal to or greater than 2 midnights due to - Age 16 and older with one or more of the following:  - Obesity  - Expected need for hospital services (PT, OT, Nursing) required for safe  discharge  - Anticipated need for postoperative skilled nursing care or inpatient rehab  - Active co-morbidities: Diabetes OR   - Unanticipated findings during/Post Surgery: Slipped out of a chair  - Patient is a high risk of re-admission due to: None    Arther Abbott 05/30/2020, 8:06 AM

## 2020-05-30 NOTE — TOC Progression Note (Addendum)
Transition of Care [facility]) - Progression Note    Patient Details  Name: Pamela Stein MRN: [id number] Date of Birth: 05/24/1955  Transition of Care [facility]) CM/SW Contact  Ihor Gully, LCSW Phone Number: 05/30/2020, 3:31 PM  Clinical Narrative:    PT recommends SNF. Patient reluctantly agreeable to short term rehab. Lives alone, independent at baseline. Drives. No stairs in home.  SNF choices discussed. Referral sent.  Patient is vaccinated and boostered for COVID.    Expected Discharge Plan: Skilled Nursing Facility Barriers to Discharge: Barriers Resolved  Expected Discharge Plan and Services Expected Discharge Plan: [place]         Expected Discharge Date: 05/30/20               DME Arranged: N/A DME Agency: NA       HH Arranged: PT,OT [place] Agency: [facility] [employer] Date [place]: 05/30/20 Time [place]: 1007 Representative spoke with at [place]: Jannett Celestine.   Social Determinants of Health (SDOH) Interventions    Readmission Risk Interventions No flowsheet data found.

## 2020-05-30 NOTE — Progress Notes (Signed)
Physical Therapy Treatment Patient Details Name: Pamela Stein MRN: [id number] DOB: [birth year] Today's Date: 05/30/2020   LEFT KNEE ROM:0 - 112degrees AMBULATION DISTANCE:14feet using RW with Mod/Max assist   History of Present Illness Pamela Stein is a 65 y/o female, s/p Left TKA on 05/28/20  with the diagnosis of Left knee osteoarthritis.    PT Comments    Patient states she feels more alert and less loopy than in AM and agreeable for therapy.  Patient continues to have increased difficulty advancing LLE when taking steps and has to shuffle foot most of time with limited active left ankle dorsiflexion, nearly fell to floor when attempting to ambulate to bathroom and required Max assist to prevent falling.  Patient incontinent of urine before making it to commode and transferred to wheelchair to be taken back to bedside.  RN informed of increased weakness and incoordination of LLE during functional activity.  Patient put back to bed after therapy.  Patient will benefit from continued physical therapy in hospital and recommended venue below to increase strength, balance, endurance for safe ADLs and gait.   Follow Up Recommendations  Supervision for mobility/OOB;SNF;Supervision/Assistance - 24 hour     Equipment Recommendations  3in1 (PT)    Recommendations for Other Services       Precautions / Restrictions Precautions Precautions: Fall Restrictions Weight Bearing Restrictions: Yes LLE Weight Bearing: Weight bearing as tolerated    Mobility  Bed Mobility Overal bed mobility: Needs Assistance Bed Mobility: Supine to Sit;Sit to Supine     Supine to sit: Supervision Sit to supine: Supervision;Min guard   General bed mobility comments: increased time, labored movement, patient had to use her hands to place LLE back onto bed during sit to supine    Transfers Overall transfer level: Needs assistance Equipment used: Rolling walker (2 wheeled) Transfers: Sit  to/from [facility] Sit to Stand: Mod assist Stand pivot transfers: Mod assist;Max assist       General transfer comment: patient had loss of balance due to poor coordination and weakness LLE  Ambulation/Gait Ambulation/Gait assistance: Max assist;Mod assist Gait Distance (Feet): 6 Feet Assistive device: Rolling walker (2 wheeled) Gait Pattern/deviations: Decreased step length - left;Decreased stance time - left;Antalgic;Decreased stride length Gait velocity: decreased   General Gait Details: patient limited to a few steps before LLE gave way and had near fall prevented by writer, unable to safely ambulate due to LLE weakness and poor coordination   Stairs             Wheelchair Mobility    Modified Rankin (Stroke Patients Only)       Balance Overall balance assessment: Needs assistance Sitting-balance support: Feet supported;No upper extremity supported Sitting balance-Leahy Scale: Good Sitting balance - Comments: seated at EOB   Standing balance support: During functional activity;Bilateral upper extremity supported Standing balance-Leahy Scale: Poor Standing balance comment: using RW                            Cognition Arousal/Alertness: Awake/alert Behavior During Therapy: WFL for tasks assessed/performed;Impulsive Overall Cognitive Status: Within Functional Limits for tasks assessed                                 General Comments: Patient states she feels more alert      Exercises General Exercises - Lower Extremity Long Arc Quad: Seated;AROM;Strengthening;Both;10 reps Hip Flexion/Marching: Seated;AROM;Strengthening;Both;10  reps Toe Raises: Seated;AROM;Strengthening;Both;15 reps Heel Raises: Seated;AROM;Strengthening;Both;15 reps    General Comments        Pertinent Vitals/Pain Pain Assessment: 0-10 Pain Score: 3  Pain Location: left knee Pain Descriptors / Indicators: Sore Pain Intervention(s):  Limited activity within patient's tolerance;Monitored during session;Repositioned    Home Living                      Prior Function            PT Goals (current goals can now be found in the care plan section) Acute Rehab PT Goals Patient Stated Goal: return home with friends/neighbors to assist PT Goal Formulation: With patient Time For Goal Achievement: 05/30/20 Potential to Achieve Goals: Good Progress towards PT goals: Progressing toward goals    Frequency    BID      PT Plan Discharge plan needs to be updated    Co-evaluation              AM-PAC PT "6 Clicks" Mobility   Outcome Measure  Help needed turning from your back to your side while in a flat bed without using bedrails?: None Help needed moving from lying on your back to sitting on the side of a flat bed without using bedrails?: A Little Help needed moving to and from a bed to a chair (including a wheelchair)?: A Lot Help needed standing up from a chair using your arms (e.g., wheelchair or bedside chair)?: A Lot Help needed to walk in hospital room?: A Lot Help needed climbing 3-5 steps with a railing? : Total 6 Click Score: 14    End of Session Equipment Utilized During Treatment: Gait belt Activity Tolerance: Patient tolerated treatment well;Patient limited by fatigue Patient left: in bed;with call bell/phone within reach;with bed alarm set;with nursing/sitter in room Nurse Communication: Mobility status PT Visit Diagnosis: Unsteadiness on feet (R26.81);Other abnormalities of gait and mobility (R26.89);Muscle weakness (generalized) (M62.81)     Time: 2725-3664 PT Time Calculation (min) (ACUTE ONLY): 34 min  Charges:  $Therapeutic Exercise: 8-22 mins $Therapeutic Activity: 8-22 mins                     4:05 PM, 05/30/20 Lonell Grandchild, MPT Physical Therapist with [facility] [phone] office [phone] mobile phone

## 2020-05-30 NOTE — Discharge Summary (Addendum)
Physician Discharge Summary  Patient ID: Pamela Stein MRN: [id number] DOB/AGE: 1956/01/10 65 y.o.  Admit date: 05/28/2020 Discharge date: 06/03/2020  Diagnosis on admission osteoarthritis of the left knee  Discharge diagnoses same, postop opioid intoxication, postop fall x2  Discharge condition stable  Procedure left total knee arthroplasty  Implants DePuy attune posterior stabilized fixed-bearing total knee replacement Size 6 femur size 6 tibia size 5 polyethylene size 35 patella button  Hospital Course:   Hospital day 1: 28 May 2020 the patient had left total knee arthroplasty with a spinal anesthetic and a postop abductor canal block, there were no complications during the procedure and the patient tolerated it very well  Hospital day 2 early in the morning the patient slipped out of bed trying to get into a wheelchair but had no sequelae.  She tolerated physical therapy well later in the day and was anxious to go home but was delayed for 24 hours of observation after the fall.  Hospital day 3 30 May 2020 had a fall in the bathroom and then could not extend her knee  On hospital day 4 patient went to surgery for exploration evacuation hematoma left knee findings included intact extensor mechanism  Hospital day 5 April 23 hospital day 6 April 24 patient underwent physical therapy observation for intoxication related to opioid complications and stability.  Patient did well with therapy and was discharged home on the 25th  Note patient had unsafe home environment with no help at home and had to be kept in the hospital until he was reasonable that she would be stable and walking.  We added a knee immobilizer to help with her left knee stability which improved her ability to walk on her own     Discharge Exam: BP 129/61 (BP Location: Right Wrist)   Pulse 93   Temp (!) 100.4 F (38 C) (Oral)   Resp 18   Ht 5\' 3"  (1.6 m)   Wt 105 kg   SpO2 96%   BMI 41.01 kg/m   Physical Exam  Examination is unremarkable the patient is neurovascularly intact she has excellent movement of her left and right lower extremities with no evidence of DVT  She is mentating well and tolerated physical therapy well.  Disposition: Discharge disposition: 01-Home or Self Care       Discharge Instructions    CPM   Complete by: As directed    Continuous passive motion machine (CPM):      Use the CPM from 0 to 90 for 4 hours per day.      You may increase by 10 per day.  You may break it up into 2 or 3 sessions per day.      Use CPM for 2 weeks or until you are told to stop.   Call MD / Call 911   Complete by: As directed    If you experience chest pain or shortness of breath, CALL 911 and be transported to the hospital emergency room.  If you develope a fever above 101 F, pus (white drainage) or increased drainage or redness at the wound, or calf pain, call your surgeon's office.   Change dressing   Complete by: As directed    Do not change the dressing until you see the doctor   Constipation Prevention   Complete by: As directed    Drink plenty of fluids.  Prune juice may be helpful.  You may use a stool softener, such as Colace (over the counter)  100 mg twice a day.  Use MiraLax (over the counter) for constipation as needed.   Diet - low sodium heart healthy   Complete by: As directed    Do not put a pillow under the knee. Place it under the heel.   Complete by: As directed    Driving restrictions   Complete by: As directed    No driving for 2 weeks   Increase activity slowly as tolerated   Complete by: As directed    Post-operative opioid taper instructions:   Complete by: As directed    POST-OPERATIVE OPIOID TAPER INSTRUCTIONS: It is important to wean off of your opioid medication as soon as possible. If you do not need pain medication after your surgery it is ok to stop day one. Opioids include: Codeine, Hydrocodone(Norco, Vicodin), Oxycodone(Percocet,  oxycontin) and hydromorphone amongst others.  Long term and even short term use of opiods can cause: Increased pain response Dependence Constipation Depression Respiratory depression And more.  Withdrawal symptoms can include Flu like symptoms Nausea, vomiting And more Techniques to manage these symptoms Hydrate well Eat regular healthy meals Stay active Use relaxation techniques(deep breathing, meditating, yoga) Do Not substitute Alcohol to help with tapering If you have been on opioids for less than two weeks and do not have pain than it is ok to stop all together.  Plan to wean off of opioids This plan should start within one week post op of your joint replacement. Maintain the same interval or time between taking each dose and first decrease the dose.  Cut the total daily intake of opioids by one tablet each day Next start to increase the time between doses. The last dose that should be eliminated is the evening dose.      TED hose   Complete by: As directed    Use stockings (TED hose) for 2 weeks on both leg(s).  You may remove them at night for sleeping.     Allergies as of 05/30/2020      Reactions   Ace Inhibitors Cough      Medication List    STOP taking these medications   amLODipine 10 MG tablet Commonly known as: NORVASC     TAKE these medications   albuterol 108 (90 Base) MCG/ACT inhaler Commonly known as: VENTOLIN HFA Inhale 1-2 puffs into the lungs every 6 (six) hours as needed for wheezing or shortness of breath.   aspirin 81 MG chewable tablet Chew 1 tablet (81 mg total) by mouth 2 (two) times daily.   docusate sodium 100 MG capsule Commonly known as: COLACE Take 1 capsule (100 mg total) by mouth 2 (two) times daily.   escitalopram 10 MG tablet Commonly known as: LEXAPRO Take 10 mg by mouth daily.   furosemide 20 MG tablet Commonly known as: LASIX Take 20 mg by mouth daily.   gabapentin 300 MG capsule Commonly known as: NEURONTIN Take  1 capsule (300 mg total) by mouth 3 (three) times daily. What changed: when to take this   HYDROcodone-acetaminophen 5-325 MG tablet Commonly known as: NORCO/VICODIN Take 1-2 tablets by mouth every 4 (four) hours as needed for moderate pain (pain score 4-6).   losartan 25 MG tablet Commonly known as: COZAAR Take 25 mg by mouth daily.   meloxicam 7.5 MG tablet Commonly known as: MOBIC Take 7.5 mg by mouth daily.   metFORMIN 500 MG tablet Commonly known as: GLUCOPHAGE TAKE 1 TABLET BY MOUTH TWICE DAILY WITH A MEAL What changed: when to  take this   pravastatin 80 MG tablet Commonly known as: PRAVACHOL TAKE 1 TABLET(80 MG) BY MOUTH EVERY EVENING What changed: See the new instructions.   spironolactone 25 MG tablet Commonly known as: ALDACTONE Take 25 mg by mouth daily.   Vitamin D (Ergocalciferol) 1.25 MG (50000 UNIT) Caps capsule Commonly known as: DRISDOL Take 50,000 Units by mouth every Sunday.            Discharge Care Instructions  (From admission, onward)         Start     Ordered   05/30/20 0000  Change dressing       Comments: Do not change the dressing until you see the doctor   05/30/20 0786          Follow-up Information    Carole Civil, MD Follow up on 06/12/2020.   Specialties: Orthopedic Surgery, Radiology Contact information: [address] [name] [address] [phone]               Signed: Arther Abbott 05/30/2020, 8:14 AM

## 2020-05-30 NOTE — NC FL2 (Signed)
[place] LEVEL OF CARE SCREENING TOOL     IDENTIFICATION  Patient Name: Pamela Stein Birthdate: 1955/09/07 Sex: female Admission Date (Current Location): 05/28/2020  [facility] and [place] Number:  [employer] and Address:  [place] [address] [place]      Provider Number: [id number]  Attending Physician Name and Address:  Carole Civil, MD  Relative Name and Phone Number:  Suzanna Obey Denman [phone]    Current Level of Care: Other (Comment) (observation) Recommended Level of Care: [place] Prior Approval Number:    Date Approved/Denied:   PASRR Number: [id number]  Discharge Plan:      Current Diagnoses: Patient Active Problem List   Diagnosis Date Noted  . History of left knee replacement 05/28/2020  . Primary osteoarthritis of left knee   . Polyneuropathy due to secondary diabetes mellitus ([place]) 05/25/2019  . Arthritis of right sacroiliac joint 05/25/2019  . Cervical disc disorder 05/25/2019  . Falls 05/25/2019  . General unsteadiness 05/25/2019  . Encounter for well woman exam with routine gynecological exam 01/09/2019  . Screening for colorectal cancer 01/09/2019  . Encounter for screening colonoscopy 12/13/2017  . Hyperparathyroidism, primary ([place]) 12/22/2016  . Vitamin D deficiency 10/31/2015  . Hyperuricemia 05/08/2013  . ONYCHOMYCOSIS, TOENAILS 02/22/2009  . NEVI, MULTIPLE 02/22/2009  . Diabetes mellitus ([place]) 01/25/2008  . BACK PAIN WITH RADICULOPATHY 11/09/2007  . Hyperlipidemia LDL goal <100 08/26/2007  . Morbid obesity ([place]) 08/26/2007  . Essential hypertension 08/26/2007  . Obstructive sleep apnea 04/20/2007    Orientation RESPIRATION BLADDER Height & Weight     Self,Time,Situation,Place  Normal Continent Weight: 231 lb 7.7 oz (105 kg) Height:  5\' 3"  (160 cm)  BEHAVIORAL SYMPTOMS/MOOD NEUROLOGICAL BOWEL NUTRITION STATUS      Continent Diet (carb  modified, low sodium, heart healthy)  AMBULATORY STATUS COMMUNICATION OF NEEDS Skin   Limited Assist Verbally Surgical wounds (left knee)                       Personal Care Assistance Level of Assistance  Bathing,Feeding,Dressing Bathing Assistance: Limited assistance Feeding assistance: Independent Dressing Assistance: Limited assistance     Functional Limitations Info  Sight,Hearing,Speech Sight Info: Adequate Hearing Info: Adequate Speech Info: Adequate    SPECIAL CARE FACTORS FREQUENCY                       Contractures Contractures Info: Not present    Additional Factors Info  Code Status,Allergies,Psychotropic Code Status Info: Full code Allergies Info: Ace Inhibitors Psychotropic Info: Lexapro         Current Medications (05/30/2020):  This is the current hospital active medication list Current Facility-Administered Medications  Medication Dose Route Frequency Provider Last Rate Last Admin  . acetaminophen (TYLENOL) tablet 325-650 mg  325-650 mg Oral Q6H PRN Carole Civil, MD   325 mg at 05/29/20 0734  . albuterol (PROVENTIL) (2.5 MG/3ML) 0.083% nebulizer solution 2.5 mg  2.5 mg Nebulization Q6H PRN Carole Civil, MD      . alum & mag hydroxide-simeth (MAALOX/MYLANTA) 200-200-20 MG/5ML suspension 30 mL  30 mL Oral Q4H PRN Carole Civil, MD      . aspirin chewable tablet 81 mg  81 mg Oral BID Carole Civil, MD   81 mg at 05/30/20 1023  . celecoxib (CELEBREX) capsule 200 mg  200 mg Oral Daily Carole Civil, MD  200 mg at 05/30/20 1023  . docusate sodium (COLACE) capsule 100 mg  100 mg Oral BID Carole Civil, MD   100 mg at 05/30/20 1023  . escitalopram (LEXAPRO) tablet 10 mg  10 mg Oral Daily Carole Civil, MD   10 mg at 05/30/20 1022  . furosemide (LASIX) tablet 20 mg  20 mg Oral Daily Carole Civil, MD   20 mg at 05/30/20 1023  . insulin aspart (novoLOG) injection 0-9 Units  0-9 Units Subcutaneous TID WC  Carole Civil, MD   7 Units at 05/30/20 0810  . losartan (COZAAR) tablet 25 mg  25 mg Oral Daily Carole Civil, MD   25 mg at 05/30/20 1022  . menthol-cetylpyridinium (CEPACOL) lozenge 3 mg  1 lozenge Oral PRN Carole Civil, MD       Or  . phenol (CHLORASEPTIC) mouth spray 1 spray  1 spray Mouth/Throat PRN Carole Civil, MD      . metFORMIN (GLUCOPHAGE) tablet 500 mg  500 mg Oral Q lunch Carole Civil, MD   500 mg at 05/30/20 1153  . metoCLOPramide (REGLAN) tablet 5-10 mg  5-10 mg Oral Q8H PRN Carole Civil, MD       Or  . metoCLOPramide (REGLAN) injection 5-10 mg  5-10 mg Intravenous Q8H PRN Carole Civil, MD      . ondansetron [facility]) injection 4 mg  4 mg Intravenous Q6H Carole Civil, MD   4 mg at 05/30/20 1154  . ondansetron (ZOFRAN) tablet 4 mg  4 mg Oral Q6H PRN Carole Civil, MD       Or  . ondansetron [facility]) injection 4 mg  4 mg Intravenous Q6H PRN Carole Civil, MD      . pantoprazole (PROTONIX) EC tablet 40 mg  40 mg Oral Daily Carole Civil, MD   40 mg at 05/30/20 1023  . pravastatin (PRAVACHOL) tablet 80 mg  80 mg Oral QPM Carole Civil, MD   80 mg at 05/29/20 1622  . senna-docusate (Senokot-S) tablet 1 tablet  1 tablet Oral QHS PRN Carole Civil, MD      . spironolactone (ALDACTONE) tablet 25 mg  25 mg Oral Daily Carole Civil, MD   25 mg at 05/30/20 1023  . [START ON 06/02/2020] Vitamin D (Ergocalciferol) (DRISDOL) capsule 50,000 Units  50,000 Units Oral Q Darol Destine, Tim Lair, MD         Discharge Medications: Please see discharge summary for a list of discharge medications.  Relevant Imaging Results:  Relevant Lab Results:   Additional Information SSN [id number] Clydene Pugh, LCSW

## 2020-05-30 NOTE — TOC Transition Note (Signed)
Transition of Care [facility]) - CM/SW Discharge Note   Patient Details  Name: Pamela Stein MRN: [id number] Date of Birth: [birth year]  Transition of Care [facility]) CM/SW Contact:  Iona Beard, [place] Phone Number: 05/30/2020, 12:07 PM   Clinical Narrative:    [facility] consulted as pt is in need of [facility] services. CSW spoke to [employer] with [facility] who agrees to accept referrals. Orders have been placed and [facility] agency will reach out to pt at d/c.   Final next level of care: [place] Barriers to Discharge: Barriers Resolved   Patient Goals and CMS Choice Patient states their goals for this hospitalization and ongoing recovery are:: Home with [facility] CMS Medicare.gov Compare Post Acute Care list provided to:: Patient Choice offered to / list presented to : Patient  Discharge Placement                       Discharge Plan and Services                DME Arranged: N/A DME Agency: NA       HH Arranged: PT,OT [place] Agency: [facility] [employer] Date [place]: 05/30/20 Time [place]: 1007 Representative spoke with at [place]: Jannett Celestine.  Social Determinants of Health (SDOH) Interventions     Readmission Risk Interventions No flowsheet data found.

## 2020-05-31 ENCOUNTER — Encounter (HOSPITAL_COMMUNITY): Payer: Self-pay | Admitting: Orthopedic Surgery

## 2020-05-31 ENCOUNTER — Inpatient Hospital Stay (HOSPITAL_COMMUNITY): Payer: Federal, State, Local not specified - PPO | Admitting: Certified Registered"

## 2020-05-31 ENCOUNTER — Encounter (HOSPITAL_COMMUNITY)
Admission: RE | Disposition: A | Discharge status: Home Health | Payer: Self-pay | Source: Home / Self Care | Attending: Orthopedic Surgery

## 2020-05-31 DIAGNOSIS — Z833 Family history of diabetes mellitus: Secondary | ICD-10-CM | POA: Diagnosis not present

## 2020-05-31 DIAGNOSIS — G4733 Obstructive sleep apnea (adult) (pediatric): Secondary | ICD-10-CM | POA: Diagnosis not present

## 2020-05-31 DIAGNOSIS — Z7984 Long term (current) use of oral hypoglycemic drugs: Secondary | ICD-10-CM | POA: Diagnosis not present

## 2020-05-31 DIAGNOSIS — S8002XA Contusion of left knee, initial encounter: Secondary | ICD-10-CM | POA: Diagnosis present

## 2020-05-31 DIAGNOSIS — W06XXXA Fall from bed, initial encounter: Secondary | ICD-10-CM | POA: Diagnosis present

## 2020-05-31 DIAGNOSIS — E119 Type 2 diabetes mellitus without complications: Secondary | ICD-10-CM | POA: Diagnosis present

## 2020-05-31 DIAGNOSIS — E785 Hyperlipidemia, unspecified: Secondary | ICD-10-CM | POA: Diagnosis present

## 2020-05-31 DIAGNOSIS — Y92230 Patient room in hospital as the place of occurrence of the external cause: Secondary | ICD-10-CM | POA: Diagnosis present

## 2020-05-31 DIAGNOSIS — I10 Essential (primary) hypertension: Secondary | ICD-10-CM | POA: Diagnosis present

## 2020-05-31 DIAGNOSIS — Z6841 Body Mass Index (BMI) 40.0 and over, adult: Secondary | ICD-10-CM | POA: Diagnosis not present

## 2020-05-31 DIAGNOSIS — M1712 Unilateral primary osteoarthritis, left knee: Secondary | ICD-10-CM | POA: Diagnosis not present

## 2020-05-31 DIAGNOSIS — T8131XA Disruption of external operation (surgical) wound, not elsewhere classified, initial encounter: Secondary | ICD-10-CM | POA: Diagnosis not present

## 2020-05-31 DIAGNOSIS — G8929 Other chronic pain: Secondary | ICD-10-CM | POA: Diagnosis present

## 2020-05-31 DIAGNOSIS — Z7982 Long term (current) use of aspirin: Secondary | ICD-10-CM | POA: Diagnosis not present

## 2020-05-31 DIAGNOSIS — T402X5A Adverse effect of other opioids, initial encounter: Secondary | ICD-10-CM | POA: Diagnosis present

## 2020-05-31 DIAGNOSIS — Z79899 Other long term (current) drug therapy: Secondary | ICD-10-CM | POA: Diagnosis not present

## 2020-05-31 DIAGNOSIS — Z96652 Presence of left artificial knee joint: Secondary | ICD-10-CM | POA: Diagnosis not present

## 2020-05-31 DIAGNOSIS — E669 Obesity, unspecified: Secondary | ICD-10-CM | POA: Diagnosis present

## 2020-05-31 DIAGNOSIS — S81002A Unspecified open wound, left knee, initial encounter: Secondary | ICD-10-CM | POA: Diagnosis not present

## 2020-05-31 HISTORY — PX: WOUND EXPLORATION: SHX6188

## 2020-05-31 LAB — GLUCOSE, CAPILLARY
Glucose-Capillary: 125 mg/dL — ABNORMAL HIGH (ref 70–99)
Glucose-Capillary: 141 mg/dL — ABNORMAL HIGH (ref 70–99)
Glucose-Capillary: 116 mg/dL — ABNORMAL HIGH (ref 70–99)
Glucose-Capillary: 151 mg/dL — ABNORMAL HIGH (ref 70–99)
Glucose-Capillary: 153 mg/dL — ABNORMAL HIGH (ref 70–99)

## 2020-05-31 SURGERY — WOUND EXPLORATION
Anesthesia: General | Site: Knee | Laterality: Left

## 2020-05-31 MED ORDER — SODIUM CHLORIDE 0.9 % IR SOLN
Status: DC | PRN
  Administered 2020-05-31: 3000 mL
  Administered 2020-05-31: 1000 mL

## 2020-05-31 MED ORDER — LIDOCAINE HCL (PF) 2 % IJ SOLN
INTRAMUSCULAR | Status: AC
  Filled 2020-05-31: qty 5

## 2020-05-31 MED ORDER — METOCLOPRAMIDE HCL 10 MG PO TABS: 5.0000 mg | ORAL_TABLET | Freq: Three times a day (TID) | ORAL | Status: DC | PRN

## 2020-05-31 MED ORDER — BUPIVACAINE LIPOSOME 1.3 % IJ SUSP
INTRAMUSCULAR | Status: AC
  Filled 2020-05-31: qty 20

## 2020-05-31 MED ORDER — EPHEDRINE 5 MG/ML INJ
INTRAVENOUS | Status: AC
  Filled 2020-05-31: qty 10

## 2020-05-31 MED ORDER — PROPOFOL 10 MG/ML IV BOLUS
INTRAVENOUS | Status: DC | PRN
  Administered 2020-05-31: 150 mg via INTRAVENOUS
  Administered 2020-05-31: 50 mg via INTRAVENOUS

## 2020-05-31 MED ORDER — BUPIVACAINE-EPINEPHRINE (PF) 0.5% -1:200000 IJ SOLN
INTRAMUSCULAR | Status: AC
  Filled 2020-05-31: qty 30

## 2020-05-31 MED ORDER — ONDANSETRON HCL 4 MG/2ML IJ SOLN: 4.0000 mg | Freq: Once | INTRAMUSCULAR | Status: DC | PRN

## 2020-05-31 MED ORDER — ONDANSETRON HCL 4 MG/2ML IJ SOLN: 4.0000 mg | Freq: Four times a day (QID) | INTRAMUSCULAR | Status: DC | PRN

## 2020-05-31 MED ORDER — POVIDONE-IODINE 10 % EX SWAB: 2.0000 "application " | Freq: Once | CUTANEOUS | Status: DC

## 2020-05-31 MED ORDER — ACETAMINOPHEN 500 MG PO TABS
500.0000 mg | ORAL_TABLET | Freq: Four times a day (QID) | ORAL | Status: AC
  Administered 2020-05-31 – 2020-06-01 (×3): 500 mg via ORAL
  Filled 2020-05-31 (×3): qty 1

## 2020-05-31 MED ORDER — METOCLOPRAMIDE HCL 5 MG/ML IJ SOLN
5.0000 mg | Freq: Three times a day (TID) | INTRAMUSCULAR | Status: DC | PRN
Start: 2020-05-31 — End: 2020-06-03

## 2020-05-31 MED ORDER — SODIUM CHLORIDE 0.9 % IV SOLN: INTRAVENOUS | Status: AC

## 2020-05-31 MED ORDER — PHENYLEPHRINE 40 MCG/ML (10ML) SYRINGE FOR IV PUSH (FOR BLOOD PRESSURE SUPPORT)
PREFILLED_SYRINGE | INTRAVENOUS | Status: DC | PRN
Start: 2020-05-31 — End: 2020-05-31
  Administered 2020-05-31 (×2): 80 ug via INTRAVENOUS
  Administered 2020-05-31: 120 ug via INTRAVENOUS

## 2020-05-31 MED ORDER — CEFAZOLIN SODIUM-DEXTROSE 2-4 GM/100ML-% IV SOLN
2.0000 g | Freq: Four times a day (QID) | INTRAVENOUS | Status: AC
Start: 2020-05-31 — End: 2020-05-31
  Administered 2020-05-31 (×2): 2 g via INTRAVENOUS
  Filled 2020-05-31 (×2): qty 100

## 2020-05-31 MED ORDER — ONDANSETRON HCL 4 MG PO TABS: 4.0000 mg | ORAL_TABLET | Freq: Four times a day (QID) | ORAL | Status: DC | PRN

## 2020-05-31 MED ORDER — LACTATED RINGERS IV SOLN: INTRAVENOUS | Status: DC | PRN

## 2020-05-31 MED ORDER — CEFAZOLIN SODIUM-DEXTROSE 2-4 GM/100ML-% IV SOLN
2.0000 g | INTRAVENOUS | Status: AC
  Administered 2020-05-31: 2 g via INTRAVENOUS
  Filled 2020-05-31: qty 100

## 2020-05-31 MED ORDER — MENTHOL 3 MG MT LOZG: 1.0000 | LOZENGE | OROMUCOSAL | Status: DC | PRN

## 2020-05-31 MED ORDER — ACETAMINOPHEN 325 MG PO TABS: 325.0000 mg | ORAL_TABLET | Freq: Four times a day (QID) | ORAL | Status: DC | PRN

## 2020-05-31 MED ORDER — BUPIVACAINE-EPINEPHRINE 0.5% -1:200000 IJ SOLN
INTRAMUSCULAR | Status: DC | PRN
  Administered 2020-05-31: 30 mL

## 2020-05-31 MED ORDER — FENTANYL CITRATE (PF) 100 MCG/2ML IJ SOLN
INTRAMUSCULAR | Status: DC | PRN
  Administered 2020-05-31: 50 ug via INTRAVENOUS

## 2020-05-31 MED ORDER — PHENOL 1.4 % MT LIQD: 1.0000 | OROMUCOSAL | Status: DC | PRN

## 2020-05-31 MED ORDER — CHLORHEXIDINE GLUCONATE 4 % EX LIQD: 60.0000 mL | Freq: Once | CUTANEOUS | Status: DC

## 2020-05-31 MED ORDER — PROPOFOL 10 MG/ML IV BOLUS
INTRAVENOUS | Status: AC
  Filled 2020-05-31: qty 20

## 2020-05-31 MED ORDER — ONDANSETRON HCL 4 MG/2ML IJ SOLN
INTRAMUSCULAR | Status: DC | PRN
  Administered 2020-05-31: 4 mg via INTRAVENOUS

## 2020-05-31 MED ORDER — LIDOCAINE HCL (CARDIAC) PF 100 MG/5ML IV SOSY
PREFILLED_SYRINGE | INTRAVENOUS | Status: DC | PRN
  Administered 2020-05-31: 100 mg via INTRAVENOUS

## 2020-05-31 MED ORDER — PHENYLEPHRINE 40 MCG/ML (10ML) SYRINGE FOR IV PUSH (FOR BLOOD PRESSURE SUPPORT)
PREFILLED_SYRINGE | INTRAVENOUS | Status: AC
  Filled 2020-05-31: qty 10

## 2020-05-31 MED ORDER — SENNOSIDES-DOCUSATE SODIUM 8.6-50 MG PO TABS: 1.0000 | ORAL_TABLET | Freq: Every evening | ORAL | Status: DC | PRN

## 2020-05-31 MED ORDER — FENTANYL CITRATE (PF) 100 MCG/2ML IJ SOLN
INTRAMUSCULAR | Status: AC
  Filled 2020-05-31: qty 2

## 2020-05-31 MED ORDER — FENTANYL CITRATE (PF) 100 MCG/2ML IJ SOLN: 25.0000 ug | INTRAMUSCULAR | Status: DC | PRN

## 2020-05-31 MED ORDER — ONDANSETRON HCL 4 MG/2ML IJ SOLN
INTRAMUSCULAR | Status: AC
  Filled 2020-05-31: qty 2

## 2020-05-31 MED ORDER — LACTATED RINGERS IV SOLN: INTRAVENOUS | Status: DC

## 2020-05-31 MED ORDER — CHLORHEXIDINE GLUCONATE 0.12 % MT SOLN: 15.0000 mL | Freq: Once | OROMUCOSAL | Status: DC

## 2020-05-31 MED ORDER — ORAL CARE MOUTH RINSE: 15.0000 mL | Freq: Once | OROMUCOSAL | Status: DC

## 2020-05-31 MED ORDER — DOCUSATE SODIUM 100 MG PO CAPS
100.0000 mg | ORAL_CAPSULE | Freq: Two times a day (BID) | ORAL | Status: DC
  Administered 2020-05-31 – 2020-06-01 (×4): 100 mg via ORAL
  Filled 2020-05-31 (×7): qty 1

## 2020-05-31 SURGICAL SUPPLY — 51 items
BANDAGE ELASTIC 4 VELCRO NS (GAUZE/BANDAGES/DRESSINGS) ×2 IMPLANT
BANDAGE ELASTIC 6 VELCRO NS (GAUZE/BANDAGES/DRESSINGS) ×1 IMPLANT
BANDAGE ESMARK 6X9 LF (GAUZE/BANDAGES/DRESSINGS) ×1 IMPLANT
BNDG CMPR 9X6 STRL LF SNTH (GAUZE/BANDAGES/DRESSINGS) ×1
BNDG CMPR STD VLCR NS LF 5.8X4 (GAUZE/BANDAGES/DRESSINGS) ×1
BNDG COHESIVE 4X5 TAN STRL (GAUZE/BANDAGES/DRESSINGS) ×1 IMPLANT
BNDG ELASTIC 4X5.8 VLCR NS LF (GAUZE/BANDAGES/DRESSINGS) ×1 IMPLANT
BNDG ESMARK 6X9 LF (GAUZE/BANDAGES/DRESSINGS) ×2
CLOTH BEACON ORANGE TIMEOUT ST (SAFETY) ×2 IMPLANT
COVER LIGHT HANDLE STERIS (MISCELLANEOUS) ×4 IMPLANT
COVER WAND RF STERILE (DRAPES) ×2 IMPLANT
CUFF TOURN SGL QUICK 42 (TOURNIQUET CUFF) ×1 IMPLANT
DECANTER SPIKE VIAL GLASS SM (MISCELLANEOUS) ×2 IMPLANT
DRAPE INCISE IOBAN 66X45 STRL (DRAPES) ×2 IMPLANT
DRSG MEPILEX BORDER 4X12 (GAUZE/BANDAGES/DRESSINGS) ×1 IMPLANT
ELECT REM PT RETURN 9FT ADLT (ELECTROSURGICAL) ×2
ELECTRODE REM PT RTRN 9FT ADLT (ELECTROSURGICAL) ×1 IMPLANT
GAUZE SPONGE 4X4 12PLY STRL (GAUZE/BANDAGES/DRESSINGS) ×2 IMPLANT
GAUZE XEROFORM 5X9 LF (GAUZE/BANDAGES/DRESSINGS) ×2 IMPLANT
GLOVE SKINSENSE NS SZ8.0 LF (GLOVE) ×1
GLOVE SKINSENSE STRL SZ8.0 LF (GLOVE) ×1 IMPLANT
GLOVE SS N UNI LF 8.5 STRL (GLOVE) ×2 IMPLANT
GLOVE SURG UNDER POLY LF SZ7 (GLOVE) ×4 IMPLANT
GOWN STRL REUS W/TWL LRG LVL3 (GOWN DISPOSABLE) ×3 IMPLANT
GOWN STRL REUS W/TWL XL LVL3 (GOWN DISPOSABLE) ×2 IMPLANT
HANDPIECE INTERPULSE COAX TIP (DISPOSABLE) ×2
IMMOBILIZER KNEE 19 UNV (ORTHOPEDIC SUPPLIES) ×1 IMPLANT
INST SET MINOR BONE (KITS) ×2 IMPLANT
IV NS IRRIG 3000ML ARTHROMATIC (IV SOLUTION) ×1 IMPLANT
KIT TURNOVER KIT A (KITS) ×2 IMPLANT
MANIFOLD NEPTUNE II (INSTRUMENTS) ×2 IMPLANT
NDL HYPO 21X1.5 SAFETY (NEEDLE) ×1 IMPLANT
NEEDLE HYPO 21X1.5 SAFETY (NEEDLE) ×2 IMPLANT
NS IRRIG 1000ML POUR BTL (IV SOLUTION) ×2 IMPLANT
PACK BASIC LIMB (CUSTOM PROCEDURE TRAY) ×2 IMPLANT
PAD ABD 5X9 TENDERSORB (GAUZE/BANDAGES/DRESSINGS) ×4 IMPLANT
PAD ARMBOARD 7.5X6 YLW CONV (MISCELLANEOUS) ×2 IMPLANT
PAD COLD SHLDR SM WRAP-ON (PAD) ×1 IMPLANT
PADDING CAST COTTON 6X4 STRL (CAST SUPPLIES) ×2 IMPLANT
SET BASIN LINEN APH (SET/KITS/TRAYS/PACK) ×2 IMPLANT
SET HNDPC FAN SPRY TIP SCT (DISPOSABLE) IMPLANT
SPONGE LAP 18X18 RF (DISPOSABLE) ×2 IMPLANT
STAPLER VISISTAT 35W (STAPLE) ×2 IMPLANT
SUT MNCRL 0 VIOLET CTX 36 (SUTURE) ×1 IMPLANT
SUT MON AB 0 CT1 (SUTURE) ×2 IMPLANT
SUT MON AB 2-0 SH 27 (SUTURE) ×2
SUT MON AB 2-0 SH27 (SUTURE) IMPLANT
SUT MONOCRYL 0 CTX 36 (SUTURE) ×6
SYR 30ML LL (SYRINGE) ×2 IMPLANT
SYR BULB IRRIG 60ML STRL (SYRINGE) ×2 IMPLANT
YANKAUER SUCT BULB TIP NO VENT (SUCTIONS) ×2 IMPLANT

## 2020-05-31 NOTE — Interval H&P Note (Signed)
History and Physical Interval Note:  05/31/2020 11:11 AM  Pamela Stein  has presented today for surgery, with the diagnosis of traumatic wound dehiscence and possible patella tendon rupture left knee.  The various methods of treatment have been discussed with the patient and family. After consideration of risks, benefits and other options for treatment, the patient has consented to  Procedure(s) with comments: WOUND EXPLORATION knee (Left) - irrigation wound exploration, wound closure PATELLA TENDON REPAIR (Left) - suture anchors 5.0 and 6.5, suture passer, # 5 suture, 18 gauge wire as a surgical intervention.  The patient's history has been reviewed, patient examined, no change in status, stable for surgery.  I have reviewed the patient's chart and labs.  Questions were answered to the patient's satisfaction.     Arther Abbott

## 2020-05-31 NOTE — Op Note (Signed)
05/31/2020  12:20 PM  PATIENT:  Pamela Stein  65 y.o. female  PRE-OPERATIVE DIAGNOSIS:  traumatic wound dehiscence and possible patella tendon rupture left knee  POST-OPERATIVE DIAGNOSIS:  traumatic wound dehiscence left knee  PROCEDURE:  Procedure(s) with comments: WOUND EXPLORATION AND IRRIGATION LEFT KNEE (Left) - irrigation wound exploration, wound closure  Operative findings: Intact extensor mechanism quadriceps patella and patellar tendon  Wound dehiscence traumatic inferior portion only  Details of procedure  Ms. Klee was seen in preop and cleared for surgery she was taken to the operating for general anesthesia she was put in the supine position with the 5 pound sandbag under the left hip  Tourniquet was applied but was not elevated  After sterile prep and drape timeout was completed  The wound was opened with digital examination.  Superficial subcutaneous tissues sutures were removed  The entire extensor mechanism was examined digitally and found to have no rents or tears including the retinaculum  The patella tendon was palpated throughout its length and was intact the patella was intact the quadriceps tendon was intact  The wound was thoroughly irrigated and closed with 0 Monocryl interrupted fashion and staples  Sterile dressings were applied along with an Ace bandage which was started at the toes and brought to the groin and the patient was placed in a knee immobilizer  Postop plan Weightbearing status as tolerated in extension in brace  Physical therapy for gait training and mobilization only.  Straight leg raises quad sets can be done but no bending of the knee for 2 weeks   SURGEON:  Surgeon(s) and Role:    * Modean Mccullum E, MD - Primary  PHYSICIAN ASSISTANT:   ASSISTANTS: Cynthia Wren  ANESTHESIA:   general  EBL:  20 mL   BLOOD ADMINISTERED:none  DRAINS: none   LOCAL MEDICATIONS USED:  MARCAINE     SPECIMEN:  No  Specimen  DISPOSITION OF SPECIMEN:  N/A  COUNTS:  YES  TOURNIQUET:  * Missing tourniquet times found for documented tourniquets in log: [id number] *  DICTATION: .Dragon Dictation  PLAN OF CARE: Admit to inpatient   PATIENT DISPOSITION:  PACU - hemodynamically stable.   Delay start of Pharmacological VTE agent (>24hrs) due to surgical blood loss or risk of bleeding: not applicable  

## 2020-05-31 NOTE — H&P (View-Only) (Signed)
Patient ID: Pamela Stein, female   DOB: 04/25/1955, 64 y.o.   MRN: [id number] POD 3   64-year-old female who is postoperative total knee course has been complicated by somnolence during the day opioids sat down on the commode and when getting up left knee postop total knee wound dehisced.  Patient was held back to the bed wound was covered with 4 x 4's and Kerlix but now patient cannot extend her left knee  Examination of the wound shows the inferior portion of the wound has opened.  Sanguinous drainage is noted but it is minimal  However patient cannot actively extend the knee and the 30 degree flexion position or with the leg straight and cannot maintain it.  This will require irrigation wound exploration possible patellar tendon repair  Patient made aware that this could be a catastrophic complication as patellar tendon repairs after total knee replacements have poor results even with surgery   

## 2020-05-31 NOTE — Progress Notes (Signed)
Physical Therapy Treatment Patient Details Name: Pamela Stein MRN: [id number] DOB: 1955-09-18 Today's Date: 05/31/2020  LEFT KNEE ROM:0 - 112degrees AMBULATION DISTANCE:15feet using RW with Mod assist  History of Present Illness Pamela Stein is a 65 y/o female, s/p Left TKA on 05/28/20  with the diagnosis of Left knee osteoarthritis.    PT Comments    Patient presents seated in chair with legs extended (assisted by nursing staff) and agreeable for therapy.  Patient able to tolerate exercises to LLE with pain remaining at 3/10 baseline and limited to a few steps at bedside to transfer back to bed with difficulty advancing LLE due to weakness and poor standing balance.  Plan:  Will await new PT orders after patient comes out of surgery today.   Follow Up Recommendations  Supervision for mobility/OOB;SNF;Supervision/Assistance - 24 hour     Equipment Recommendations  3in1 (PT)    Recommendations for Other Services       Precautions / Restrictions Precautions Precautions: Fall Restrictions Weight Bearing Restrictions: Yes LLE Weight Bearing: Weight bearing as tolerated    Mobility  Bed Mobility Overal bed mobility: Needs Assistance Bed Mobility: Sit to Supine       Sit to supine: Min guard   General bed mobility comments: required Min guard assist to to move LLE when getting back in bed    Transfers Overall transfer level: Needs assistance Equipment used: Rolling walker (2 wheeled) Transfers: Sit to/from [facility] Sit to Stand: Min assist;Mod assist Stand pivot transfers: Mod assist       General transfer comment: increased time, labored movement  Ambulation/Gait Ambulation/Gait assistance: Mod assist Gait Distance (Feet): 4 Feet Assistive device: Rolling walker (2 wheeled) Gait Pattern/deviations: Decreased step length - left;Decreased stance time - left;Antalgic;Decreased stride length Gait velocity: decreased   General Gait  Details: limited to a few steps at bedside due to LLE weakness, fatigue   Stairs             Wheelchair Mobility    Modified Rankin (Stroke Patients Only)       Balance Overall balance assessment: Needs assistance Sitting-balance support: Feet supported;No upper extremity supported Sitting balance-Leahy Scale: Good Sitting balance - Comments: seated at EOB   Standing balance support: During functional activity;Bilateral upper extremity supported Standing balance-Leahy Scale: Poor Standing balance comment: fair/poor using RW                            Cognition Arousal/Alertness: Awake/alert Behavior During Therapy: WFL for tasks assessed/performed Overall Cognitive Status: Within Functional Limits for tasks assessed                                        Exercises Total Joint Exercises Ankle Circles/Pumps: 10 reps;Both;Strengthening;AROM;Seated Quad Sets: 10 reps;Left;AROM;Seated Heel Slides: 10 reps;Left;Strengthening;AROM;Seated Goniometric ROM: left knee: 0 - 112 degrees    General Comments        Pertinent Vitals/Pain Pain Assessment: 0-10 Pain Score: 3  Pain Location: left knee Pain Descriptors / Indicators: Sore Pain Intervention(s): Limited activity within patient's tolerance;Monitored during session;Repositioned    Home Living                      Prior Function            PT Goals (current goals can now be found in the  care plan section) Acute Rehab PT Goals Patient Stated Goal: return home with friends/neighbors to assist PT Goal Formulation: With patient Time For Goal Achievement: 06/04/20 Potential to Achieve Goals: Good Progress towards PT goals: Progressing toward goals    Frequency    BID      PT Plan Current plan remains appropriate    Co-evaluation              AM-PAC PT "6 Clicks" Mobility   Outcome Measure  Help needed turning from your back to your side while in a flat bed  without using bedrails?: None Help needed moving from lying on your back to sitting on the side of a flat bed without using bedrails?: A Little Help needed moving to and from a bed to a chair (including a wheelchair)?: A Lot Help needed standing up from a chair using your arms (e.g., wheelchair or bedside chair)?: A Lot Help needed to walk in hospital room?: A Lot Help needed climbing 3-5 steps with a railing? : Total 6 Click Score: 14    End of Session   Activity Tolerance: Patient tolerated treatment well;Patient limited by fatigue Patient left: in bed;with call bell/phone within reach;with bed alarm set Nurse Communication: Mobility status PT Visit Diagnosis: Unsteadiness on feet (R26.81);Other abnormalities of gait and mobility (R26.89);Muscle weakness (generalized) (M62.81)     Time: 3419-3790 PT Time Calculation (min) (ACUTE ONLY): 25 min  Charges:  $Therapeutic Exercise: 8-22 mins $Therapeutic Activity: 8-22 mins                     10:34 AM, 05/31/20 Lonell Grandchild, MPT Physical Therapist with [facility] [phone] office [phone] mobile phone

## 2020-05-31 NOTE — Transfer of Care (Signed)
Immediate Anesthesia Transfer of Care Note  Patient: Pamela Stein  Procedure(s) Performed: WOUND EXPLORATION AND IRRIGATION LEFT KNEE (Left Knee)  Patient Location: PACU  Anesthesia Type:General  Level of Consciousness: awake  Airway & Oxygen Therapy: Patient Spontanous Breathing and Patient connected to face mask oxygen  Post-op Assessment: Report given to RN and Post -op Vital signs reviewed and stable  Post vital signs: Reviewed and stable  Last Vitals:  Vitals Value Taken Time  BP 127/50 05/31/20 1222  Temp    Pulse 88 05/31/20 1223  Resp 15 05/31/20 1223  SpO2 99 % 05/31/20 1223  Vitals shown include unvalidated device data.  Last Pain:  Vitals:   05/31/20 1000  TempSrc: Oral  PainSc: 0-No pain      Patients Stated Pain Goal: 5 (97/94/80 1655)  Complications: No complications documented.

## 2020-05-31 NOTE — Anesthesia Procedure Notes (Signed)
Procedure Name: LMA Insertion Date/Time: 05/31/2020 11:26 AM Performed by: Lieutenant Diego, CRNA Pre-anesthesia Checklist: Patient identified, Emergency Drugs available, Suction available and Patient being monitored Patient Re-evaluated:Patient Re-evaluated prior to induction Oxygen Delivery Method: Circle system utilized Preoxygenation: Pre-oxygenation with 100% oxygen Induction Type: IV induction Ventilation: Mask ventilation without difficulty LMA: LMA inserted LMA Size: 4.0 Number of attempts: 1 Placement Confirmation: positive ETCO2 and breath sounds checked- equal and bilateral Tube secured with: Tape Dental Injury: Teeth and Oropharynx as per pre-operative assessment

## 2020-05-31 NOTE — Anesthesia Postprocedure Evaluation (Signed)
Anesthesia Post Note  Patient: Pamela Stein  Procedure(s) Performed: WOUND EXPLORATION AND IRRIGATION LEFT KNEE (Left Knee)  Patient location during evaluation: Phase II Anesthesia Type: General Level of consciousness: awake Pain management: pain level controlled Vital Signs Assessment: post-procedure vital signs reviewed and stable Respiratory status: spontaneous breathing and respiratory function stable Cardiovascular status: blood pressure returned to baseline and stable Postop Assessment: no headache and no apparent nausea or vomiting Anesthetic complications: no Comments: Late entry   No complications documented.   Last Vitals:  Vitals:   05/31/20 1300 05/31/20 1320  BP: 120/75 (!) 146/91  Pulse: 89 87  Resp: 13 16  Temp:  36.9 C  SpO2: 93% 92%    Last Pain:  Vitals:   05/31/20 1400  TempSrc:   PainSc: 0-No pain                 Louann Sjogren

## 2020-05-31 NOTE — Progress Notes (Signed)
Patient ID: Pamela Stein, female   DOB: 12-13-1955, 65 y.o.   MRN: [id number] POD 43   65 year old female who is postoperative total knee course has been complicated by somnolence during the day opioids sat down on the commode and when getting up left knee postop total knee wound dehisced.  Patient was held back to the bed wound was covered with 4 x 4's and Kerlix but now patient cannot extend her left knee  Examination of the wound shows the inferior portion of the wound has opened.  Sanguinous drainage is noted but it is minimal  However patient cannot actively extend the knee and the 30 degree flexion position or with the leg straight and cannot maintain it.  This will require irrigation wound exploration possible patellar tendon repair  Patient made aware that this could be a catastrophic complication as patellar tendon repairs after total knee replacements have poor results even with surgery

## 2020-05-31 NOTE — Brief Op Note (Signed)
05/31/2020  12:20 PM  PATIENT:  Pamela Stein  65 y.o. female  PRE-OPERATIVE DIAGNOSIS:  traumatic wound dehiscence and possible patella tendon rupture left knee  POST-OPERATIVE DIAGNOSIS:  traumatic wound dehiscence left knee  PROCEDURE:  Procedure(s) with comments: WOUND EXPLORATION AND IRRIGATION LEFT KNEE (Left) - irrigation wound exploration, wound closure  Operative findings: Intact extensor mechanism quadriceps patella and patellar tendon  Wound dehiscence traumatic inferior portion only  Details of procedure  Pamela Stein was seen in preop and cleared for surgery she was taken to the operating for general anesthesia she was put in the supine position with the 5 pound sandbag under the left hip  Tourniquet was applied but was not elevated  After sterile prep and drape timeout was completed  The wound was opened with digital examination.  Superficial subcutaneous tissues sutures were removed  The entire extensor mechanism was examined digitally and found to have no rents or tears including the retinaculum  The patella tendon was palpated throughout its length and was intact the patella was intact the quadriceps tendon was intact  The wound was thoroughly irrigated and closed with 0 Monocryl interrupted fashion and staples  Sterile dressings were applied along with an Ace bandage which was started at the toes and brought to the groin and the patient was placed in a knee immobilizer  Postop plan Weightbearing status as tolerated in extension in brace  Physical therapy for gait training and mobilization only.  Straight leg raises quad sets can be done but no bending of the knee for 2 weeks   SURGEON:  Surgeon(s) and Role:    * Carole Civil, MD - Primary  PHYSICIAN ASSISTANT:   ASSISTANTS: Fulton Mole  ANESTHESIA:   general  EBL:  20 mL   BLOOD ADMINISTERED:none  DRAINS: none   LOCAL MEDICATIONS USED:  MARCAINE     SPECIMEN:  No  Specimen  DISPOSITION OF SPECIMEN:  N/A  COUNTS:  YES  TOURNIQUET:  * Missing tourniquet times found for documented tourniquets in log: [id number] *  DICTATION: .Dragon Dictation  PLAN OF CARE: Admit to inpatient   PATIENT DISPOSITION:  PACU - hemodynamically stable.   Delay start of Pharmacological VTE agent (>24hrs) due to surgical blood loss or risk of bleeding: not applicable

## 2020-05-31 NOTE — Progress Notes (Signed)
Physical Therapy Treatment Patient Details Name: Pamela Stein MRN: [id number] DOB: 1955-08-07 Today's Date: 05/31/2020  AMBULATION DISTANCE:100 feet using RW withMin assist  History of Present Illness Pamela Stein is a 65 y/o female, s/p Left TKA on 05/28/20  with the diagnosis of Left knee osteoarthritis.    PT Comments    REASSEMENT: Patient demonstrates increased endurance/distance for ambulation with fair return for advancing LLE, had to take shorter steps, no loss of balance, required sitting rest break before making it back to room.  Patient demonstrates increased stability LLE while wearing immobilizer without c/o increased pain in LLE.  Patient tolerated sitting up in chair after therapy with visitor present in room - RN notified.  Patient will benefit from continued physical therapy in hospital and recommended venue below to increase strength, balance, endurance for safe ADLs and gait.   Follow Up Recommendations  Supervision for mobility/OOB;SNF;Supervision/Assistance - 24 hour     Equipment Recommendations  3in1 (PT);Other (comment) (gait belt)    Recommendations for Other Services       Precautions / Restrictions Precautions Precautions: Fall Required Braces or Orthoses: Knee Immobilizer - Left Restrictions Weight Bearing Restrictions: Yes LLE Weight Bearing: Weight bearing as tolerated Other Position/Activity Restrictions: no flexion of left knee    Mobility  Bed Mobility Overal bed mobility: Needs Assistance Bed Mobility: Sit to Supine     Supine to sit: Supervision     General bed mobility comments: demonstrates good return for moving LLE    Transfers Overall transfer level: Needs assistance Equipment used: Rolling walker (2 wheeled) Transfers: Sit to/from [employer] Transfers   Stand pivot transfers: Min guard;Min assist       General transfer comment: increased time, labored movement  Ambulation/Gait Ambulation/Gait  assistance: Min assist Gait Distance (Feet): 100 Feet Assistive device: Rolling walker (2 wheeled) Gait Pattern/deviations: Decreased step length - left;Decreased stance time - left;Antalgic;Decreased stride length Gait velocity: decreased   General Gait Details: increased endurance/distance for ambulation with fair return for advancing LLE, had to take shorter steps, no loss of balance, required sitting rest break before making it back to room   Stairs             Wheelchair Mobility    Modified Rankin (Stroke Patients Only)       Balance Overall balance assessment: Needs assistance Sitting-balance support: Feet supported;No upper extremity supported Sitting balance-Leahy Scale: Good Sitting balance - Comments: seated at EOB   Standing balance support: During functional activity;Bilateral upper extremity supported Standing balance-Leahy Scale: Fair Standing balance comment: using RW and left knee immobilizer                            Cognition Arousal/Alertness: Awake/alert Behavior During Therapy: WFL for tasks assessed/performed Overall Cognitive Status: Within Functional Limits for tasks assessed                                        Exercises      General Comments        Pertinent Vitals/Pain Pain Assessment: 0-10 Pain Score: 3  Pain Location: left knee Pain Descriptors / Indicators: Sore    Home Living                      Prior Function  PT Goals (current goals can now be found in the care plan section) Acute Rehab PT Goals Patient Stated Goal: return home with friends/neighbors to assist PT Goal Formulation: With patient Time For Goal Achievement: 06/04/20 Potential to Achieve Goals: Good Progress towards PT goals: Progressing toward goals    Frequency    BID      PT Plan Current plan remains appropriate    Co-evaluation              AM-PAC PT "6 Clicks" Mobility   Outcome  Measure  Help needed turning from your back to your side while in a flat bed without using bedrails?: None Help needed moving from lying on your back to sitting on the side of a flat bed without using bedrails?: A Little Help needed moving to and from a bed to a chair (including a wheelchair)?: A Little Help needed standing up from a chair using your arms (e.g., wheelchair or bedside chair)?: A Little Help needed to walk in hospital room?: A Little Help needed climbing 3-5 steps with a railing? : Total 6 Click Score: 17    End of Session Equipment Utilized During Treatment: Gait belt Activity Tolerance: Patient tolerated treatment well;Patient limited by fatigue Patient left: in chair;with call bell/phone within reach;with chair alarm set;with family/visitor present Nurse Communication: Mobility status PT Visit Diagnosis: Unsteadiness on feet (R26.81);Other abnormalities of gait and mobility (R26.89);Muscle weakness (generalized) (M62.81)     Time: 4010-2725 PT Time Calculation (min) (ACUTE ONLY): 33 min  Charges:  $Gait Training: 8-22 mins $Therapeutic Activity: 8-22 mins                     3:56 PM, 05/31/20 Lonell Grandchild, MPT Physical Therapist with [facility] 336 [phone] office [phone] mobile phone

## 2020-05-31 NOTE — Progress Notes (Signed)
   05/31/20 0518  What Happened  Was fall witnessed? Yes  Who witnessed fall? Rubin Payor NT  Patients activity before fall ambulating-assisted  Point of contact buttocks  Was patient injured? Yes  Patient found in bathroom;on floor  Follow Up  MD notified Dr. Aline Brochure  Time MD notified [phone]  Family notified No - patient refusal  Additional tests Yes-comment (Pt going back to OR, as surgical site opened.)  Simple treatment Dressing  Progress note created (see row info) Yes  Vitals  Temp 98.5 F (36.9 C)  Temp Source Oral  BP 129/63  MAP (mmHg) 82  BP Location Left Arm  BP Method Automatic  Patient Position (if appropriate) Sitting  Pulse Rate 96  Pulse Rate Source Monitor  Resp 18  Oxygen Therapy  SpO2 93 %  O2 Device Room Air  Pain Assessment  Pain Scale 0-10  Pain Score 0 (Pt denies pain after fall)    Dr. Aline Brochure to bedside after being notified. Pt's Left Knee new dressing is 4x4 with gauze wrap. Pt NPO.

## 2020-05-31 NOTE — Anesthesia Preprocedure Evaluation (Signed)
Anesthesia Evaluation  Patient identified by MRN, date of birth, ID band Patient awake    Reviewed: Allergy & Precautions, H&P , NPO status , Patient's Chart, lab work & pertinent test results, reviewed documented beta blocker date and time   Airway Mallampati: II  TM Distance: >3 FB Neck ROM: full    Dental no notable dental hx.    Pulmonary neg pulmonary ROS,    Pulmonary exam normal breath sounds clear to auscultation       Cardiovascular Exercise Tolerance: Good hypertension, negative cardio ROS   Rhythm:regular Rate:Normal     Neuro/Psych PSYCHIATRIC DISORDERS Anxiety  Neuromuscular disease    GI/Hepatic negative GI ROS, Neg liver ROS,   Endo/Other  diabetesMorbid obesity  Renal/GU negative Renal ROS  negative genitourinary   Musculoskeletal   Abdominal   Peds  Hematology negative hematology ROS (+)   Anesthesia Other Findings   Reproductive/Obstetrics negative OB ROS                             Anesthesia Physical  Anesthesia Plan  ASA: III  Anesthesia Plan: General and General LMA   Post-op Pain Management:  Regional for Post-op pain   Induction:   PONV Risk Score and Plan: Ondansetron  Airway Management Planned:   Additional Equipment:   Intra-op Plan:   Post-operative Plan:   Informed Consent: I have reviewed the patients History and Physical, chart, labs and discussed the procedure including the risks, benefits and alternatives for the proposed anesthesia with the patient or authorized representative who has indicated his/her understanding and acceptance.     Dental Advisory Given  Plan Discussed with: CRNA  Anesthesia Plan Comments:         Anesthesia Quick Evaluation

## 2020-06-01 LAB — GLUCOSE, CAPILLARY
Glucose-Capillary: 124 mg/dL — ABNORMAL HIGH (ref 70–99)
Glucose-Capillary: 123 mg/dL — ABNORMAL HIGH (ref 70–99)
Glucose-Capillary: 114 mg/dL — ABNORMAL HIGH (ref 70–99)
Glucose-Capillary: 160 mg/dL — ABNORMAL HIGH (ref 70–99)

## 2020-06-01 NOTE — Treatment Plan (Signed)
Pt given pravachol early as patient was eating dinner sister had brought at this time.

## 2020-06-01 NOTE — Progress Notes (Signed)
Patient ID: VEE BAHE, female   DOB: 1955/06/23, 65 y.o.   MRN: [id number] Problem list Postop day 4 left total knee  Postop day 1 wound exploration evacuation hematoma  Postop confusion opioid intoxication  Poor mobility  Patient unable to go home because of lack of support  BP (!) 110/46 (BP Location: Right Arm)   Pulse 73   Temp 99 F (37.2 C) (Oral)   Resp 16   Ht 5\' 3"  (1.6 m)   Wt 105 kg   SpO2 98%   BMI 41.01 kg/m   Pamela Stein is doing well today.  She was able to walk in the knee immobilizer with physical therapy yesterday.  She has good pain control  She is neurovascular intact  She is mentating well  The patient will undergo continued physical therapy today and tomorrow with a discharge plan for Monday

## 2020-06-01 NOTE — Progress Notes (Signed)
Physical Therapy Treatment Patient Details Name: Pamela Stein MRN: [id number] DOB: 1955-05-05 Today's Date: 06/01/2020  AMBULATION DISTANCE:137feet using RW withSupervision  History of Present Illness Pamela Stein is a 65 y/o female, s/p WOUND EXPLORATION AND IRRIGATION LEFT KNEE (Left) - irrigation wound exploration, wound closure on 05/31/20, s/p Left TKA on 05/28/20  with the diagnosis of Left knee osteoarthritis.    PT Comments    Patient present still up in chair and agreeable for therapy.  Patient demonstrates good return for focusing on functional task with improvement for maintaining standing balance during transfers and ambulation.  Patient able to transfer to [facility] and wipe self and demonstrates increased endurance/distance for ambulation in hallway without having to take a sitting rest break before returning to room.  Patient tolerated staying up in chair after therapy.  Patient will benefit from continued physical therapy in hospital and recommended venue below to increase strength, balance, endurance for safe ADLs and gait.   Follow Up Recommendations  Supervision for mobility/OOB;SNF;Supervision/Assistance - 24 hour     Equipment Recommendations  3in1 (PT)    Recommendations for Other Services       Precautions / Restrictions Precautions Precautions: Fall Required Braces or Orthoses: Knee Immobilizer - Left Knee Immobilizer - Left: On when out of bed or walking Restrictions Weight Bearing Restrictions: Yes LLE Weight Bearing: Weight bearing as tolerated Other Position/Activity Restrictions: no flexion of left knee    Mobility  Bed Mobility Overal bed mobility: Needs Assistance Bed Mobility: Supine to Sit     Supine to sit: Modified independent (Device/Increase time);Supervision     General bed mobility comments: Presents up in chair    Transfers Overall transfer level: Needs assistance Equipment used: Rolling walker (2 wheeled) Transfers: Sit  to/from [facility] Sit to Stand: Supervision Stand pivot transfers: Supervision       General transfer comment: slightly labored movement, no loss of balance  Ambulation/Gait Ambulation/Gait assistance: Supervision Gait Distance (Feet): 150 Feet Assistive device: Rolling walker (2 wheeled) Gait Pattern/deviations: Decreased step length - left;Decreased stance time - left;Antalgic;Decreased stride length Gait velocity: decreased   General Gait Details: slightly labored cadence without out loss of balance, good return for advancing LLE while wearing immobilizer, followed with w/c for safety   Stairs             Wheelchair Mobility    Modified Rankin (Stroke Patients Only)       Balance Overall balance assessment: Needs assistance Sitting-balance support: Feet supported;No upper extremity supported Sitting balance-Leahy Scale: Good Sitting balance - Comments: seated at edge of chair   Standing balance support: During functional activity;Bilateral upper extremity supported Standing balance-Leahy Scale: Fair Standing balance comment: using RW and left knee immobilizer                            Cognition Arousal/Alertness: Awake/alert Behavior During Therapy: WFL for tasks assessed/performed Overall Cognitive Status: Within Functional Limits for tasks assessed                                        Exercises Total Joint Exercises Ankle Circles/Pumps: Supine;15 reps;Both;Strengthening;AROM Quad Sets: Supine;10 reps;Left;Strengthening;AROM Straight Leg Raises: Supine;10 reps;Left;Strengthening;AAROM    General Comments        Pertinent Vitals/Pain Pain Assessment: No/denies pain Pain Score: 3  Pain Location: left knee Pain Descriptors /  Indicators: Sore Pain Intervention(s): Limited activity within patient's tolerance;Monitored during session;Repositioned    Home Living                      Prior  Function            PT Goals (current goals can now be found in the care plan section) Acute Rehab PT Goals Patient Stated Goal: return home with friends/neighbors to assist PT Goal Formulation: With patient Time For Goal Achievement: 06/04/20 Potential to Achieve Goals: Good Progress towards PT goals: Progressing toward goals    Frequency    7X/week      PT Plan Current plan remains appropriate    Co-evaluation              AM-PAC PT "6 Clicks" Mobility   Outcome Measure  Help needed turning from your back to your side while in a flat bed without using bedrails?: None Help needed moving from lying on your back to sitting on the side of a flat bed without using bedrails?: None Help needed moving to and from a bed to a chair (including a wheelchair)?: A Little Help needed standing up from a chair using your arms (e.g., wheelchair or bedside chair)?: None Help needed to walk in hospital room?: A Little Help needed climbing 3-5 steps with a railing? : A Lot 6 Click Score: 20    End of Session Equipment Utilized During Treatment: Gait belt Activity Tolerance: Patient tolerated treatment well;Patient limited by fatigue Patient left: in chair;with call bell/phone within reach;with chair alarm set Nurse Communication: Mobility status PT Visit Diagnosis: Unsteadiness on feet (R26.81);Other abnormalities of gait and mobility (R26.89);Muscle weakness (generalized) (M62.81)     Time: 0623-7628 PT Time Calculation (min) (ACUTE ONLY): 27 min  Charges:  $Gait Training: 8-22 mins $Therapeutic Exercise: 8-22 mins $Therapeutic Activity: 8-22 mins                     12:42 PM, 06/01/20 Lonell Grandchild, MPT Physical Therapist with [facility] [phone] office [phone] mobile phone

## 2020-06-01 NOTE — Progress Notes (Signed)
Physical Therapy Treatment Patient Details Name: Pamela Stein MRN: [id number] DOB: September 04, 1955 Today's Date: 06/01/2020  AMBULATION DISTANCE:110 feet using RW withMinassist  History of Present Illness Pamela Stein is a 65 y/o female, s/p WOUND EXPLORATION AND IRRIGATION LEFT KNEE (Left) - irrigation wound exploration, wound closure on 05/31/20, s/p Left TKA on 05/28/20  with the diagnosis of Left knee osteoarthritis.    PT Comments    Patient demonstrates good return for advancing LLE with immobilizer on, had 1 near fall secondary to being easily distracted, otherwise able to ambulate without loss of balance.  Patient required active assistance to complete LLE SLR's due to weakness and tolerated sitting up in chair with BLE elevated after therapy - RN aware.  Patient will benefit from continued physical therapy in hospital and recommended venue below to increase strength, balance, endurance for safe ADLs and gait.   Follow Up Recommendations  Supervision for mobility/OOB;SNF;Supervision/Assistance - 24 hour     Equipment Recommendations  3in1 (PT)    Recommendations for Other Services       Precautions / Restrictions Precautions Precautions: Fall Required Braces or Orthoses: Knee Immobilizer - Left Knee Immobilizer - Left: On when out of bed or walking Restrictions Weight Bearing Restrictions: Yes LLE Weight Bearing: Weight bearing as tolerated Other Position/Activity Restrictions: no flexion of left knee    Mobility  Bed Mobility Overal bed mobility: Needs Assistance Bed Mobility: Supine to Sit     Supine to sit: Modified independent (Device/Increase time);Supervision     General bed mobility comments: slightly labored movement    Transfers Overall transfer level: Needs assistance Equipment used: Rolling walker (2 wheeled) Transfers: Sit to/from [facility] Sit to Stand: Min guard Stand pivot transfers: Min guard       General  transfer comment: slightly labored movement, increased time  Ambulation/Gait Ambulation/Gait assistance: Min assist Gait Distance (Feet): 110 Feet Assistive device: Rolling walker (2 wheeled) Gait Pattern/deviations: Decreased step length - left;Decreased stance time - left;Antalgic;Decreased stride length Gait velocity: decreased   General Gait Details: slightly labored cadence with good return for advancing LLE, had one near fall due to being easily distracted, otherwise able to ambulate without loss of balance, followed with w/c for safety   Stairs             Wheelchair Mobility    Modified Rankin (Stroke Patients Only)       Balance Overall balance assessment: Needs assistance Sitting-balance support: Feet supported;No upper extremity supported Sitting balance-Leahy Scale: Good Sitting balance - Comments: seated at EOB   Standing balance support: During functional activity;Bilateral upper extremity supported Standing balance-Leahy Scale: Fair Standing balance comment: using RW and left knee immobilizer                            Cognition Arousal/Alertness: Awake/alert Behavior During Therapy: WFL for tasks assessed/performed Overall Cognitive Status: Within Functional Limits for tasks assessed                                        Exercises Total Joint Exercises Ankle Circles/Pumps: Supine;15 reps;Both;Strengthening;AROM Quad Sets: Supine;10 reps;Left;Strengthening;AROM Straight Leg Raises: Supine;10 reps;Left;Strengthening;AAROM    General Comments        Pertinent Vitals/Pain Pain Score: 3  Pain Location: left knee Pain Descriptors / Indicators: Sore    Home Living  Prior Function            PT Goals (current goals can now be found in the care plan section) Acute Rehab PT Goals Patient Stated Goal: return home with friends/neighbors to assist PT Goal Formulation: With patient Time  For Goal Achievement: 06/04/20 Potential to Achieve Goals: Good Progress towards PT goals: Progressing toward goals    Frequency    7X/week      PT Plan Current plan remains appropriate    Co-evaluation              AM-PAC PT "6 Clicks" Mobility   Outcome Measure  Help needed turning from your back to your side while in a flat bed without using bedrails?: None Help needed moving from lying on your back to sitting on the side of a flat bed without using bedrails?: None Help needed moving to and from a bed to a chair (including a wheelchair)?: A Little Help needed standing up from a chair using your arms (e.g., wheelchair or bedside chair)?: A Little Help needed to walk in hospital room?: A Little Help needed climbing 3-5 steps with a railing? : Total 6 Click Score: 18    End of Session Equipment Utilized During Treatment: Gait belt Activity Tolerance: Patient tolerated treatment well;Patient limited by fatigue Patient left: in chair;with call bell/phone within reach;with chair alarm set Nurse Communication: Mobility status PT Visit Diagnosis: Unsteadiness on feet (R26.81);Other abnormalities of gait and mobility (R26.89);Muscle weakness (generalized) (M62.81)     Time: 9211-9417 PT Time Calculation (min) (ACUTE ONLY): 36 min  Charges:  $Gait Training: 8-22 mins $Therapeutic Exercise: 8-22 mins                     10:30 AM, 06/01/20 Lonell Grandchild, MPT Physical Therapist with [facility] 336 [phone] office [phone] mobile phone

## 2020-06-02 LAB — GLUCOSE, CAPILLARY
Glucose-Capillary: 120 mg/dL — ABNORMAL HIGH (ref 70–99)
Glucose-Capillary: 121 mg/dL — ABNORMAL HIGH (ref 70–99)
Glucose-Capillary: 91 mg/dL (ref 70–99)
Glucose-Capillary: 154 mg/dL — ABNORMAL HIGH (ref 70–99)

## 2020-06-02 NOTE — Progress Notes (Signed)
Physical Therapy Treatment Patient Details Name: Pamela Stein MRN: [id number] DOB: [birth year] Today's Date: 06/02/2020  AMBULATION DISTANCE:125feet x 2 trials using RW withSupervision   History of Present Illness Pamela Stein is a 65 y/o female, s/p WOUND EXPLORATION AND IRRIGATION LEFT KNEE (Left) - irrigation wound exploration, wound closure on 05/31/20, s/p Left TKA on 05/28/20  with the diagnosis of Left knee osteoarthritis.    PT Comments    Patient demonstrates good return for moving LLE while getting into/out of bed, transferring to/from raised toilet seat in bath room and washing hands while standing over sink using RW.  Patient demonstrates good tolerance for ambulation in hallway, limited mostly due to c/o fatiguing and mild numbness of left hand secondary to support self to limit pain on left knee when walking, requiring 1 sitting rest break mostly due to c/o feeling hot before ambulating back to room without loss of balance.  Patient tolerated sitting up in chair after therapy.  Patient will benefit from continued physical therapy in hospital and recommended venue below to increase strength, balance, endurance for safe ADLs and gait.   Follow Up Recommendations  Home health PT;Supervision for mobility/OOB;Supervision - Intermittent     Equipment Recommendations  3in1 (PT)    Recommendations for Other Services       Precautions / Restrictions Precautions Precautions: Fall Required Braces or Orthoses: Knee Immobilizer - Left Knee Immobilizer - Left: On when out of bed or walking Restrictions Weight Bearing Restrictions: Yes LLE Weight Bearing: Weight bearing as tolerated Other Position/Activity Restrictions: no flexion of left knee    Mobility  Bed Mobility Overal bed mobility: Needs Assistance Bed Mobility: Supine to Sit;Sit to Supine     Supine to sit: Modified independent (Device/Increase time);Supervision Sit to supine: Modified independent  (Device/Increase time);Supervision   General bed mobility comments: demonstrates good return for moving LLE during bed mobility, has to lift with hands when getting into bed    Transfers Overall transfer level: Needs assistance Equipment used: Rolling walker (2 wheeled) Transfers: Sit to/from [employer] Transfers Sit to Stand: Modified independent (Device/Increase time);Supervision Stand pivot transfers: Modified independent (Device/Increase time);Supervision       General transfer comment: demonstrates good return for transferring to raised toliet seat in bathroom  Ambulation/Gait Ambulation/Gait assistance: Supervision Gait Distance (Feet): 110 Feet Assistive device: Rolling walker (2 wheeled) Gait Pattern/deviations: Decreased step length - left;Decreased stance time - left;Antalgic;Decreased stride length Gait velocity: decreased   General Gait Details: slightly labored cadence without loss of balance, c/o fatiguing with mild numbness in left hand secondary to leaning on arms to relieve left knee pain, had to take a sitting rest breaks mostly due to feeling hot before ambulating back to room   Stairs             Wheelchair Mobility    Modified Rankin (Stroke Patients Only)       Balance Overall balance assessment: Needs assistance Sitting-balance support: Feet unsupported;No upper extremity supported Sitting balance-Leahy Scale: Good Sitting balance - Comments: seated at EOB   Standing balance support: During functional activity;Bilateral upper extremity supported   Standing balance comment: fair/good using RW                            Cognition Arousal/Alertness: Awake/alert Behavior During Therapy: WFL for tasks assessed/performed Overall Cognitive Status: Within Functional Limits for tasks assessed  Exercises Total Joint Exercises Ankle Circles/Pumps: 15  reps;Both;Strengthening;AROM;Seated (long sitting in lounge chair with legs elevated) Quad Sets: 10 reps;Left;Strengthening;AROM;Seated (long sitting in lounge chair with legs elevated) Long Arc Quad: Seated;AROM;Strengthening;Both;10 reps    General Comments        Pertinent Vitals/Pain Pain Assessment: 0-10 Pain Score: 3  Pain Location: left knee Pain Descriptors / Indicators: Sore Pain Intervention(s): Limited activity within patient's tolerance;Monitored during session;Repositioned    Home Living                      Prior Function            PT Goals (current goals can now be found in the care plan section) Acute Rehab PT Goals Patient Stated Goal: return home with friends/neighbors to assist PT Goal Formulation: With patient Time For Goal Achievement: 06/04/20 Potential to Achieve Goals: Good Progress towards PT goals: Progressing toward goals    Frequency    7X/week      PT Plan Current plan remains appropriate    Co-evaluation              AM-PAC PT "6 Clicks" Mobility   Outcome Measure  Help needed turning from your back to your side while in a flat bed without using bedrails?: None Help needed moving from lying on your back to sitting on the side of a flat bed without using bedrails?: None Help needed moving to and from a bed to a chair (including a wheelchair)?: A Little Help needed standing up from a chair using your arms (e.g., wheelchair or bedside chair)?: None Help needed to walk in hospital room?: A Little Help needed climbing 3-5 steps with a railing? : A Lot 6 Click Score: 20    End of Session Equipment Utilized During Treatment: Gait belt Activity Tolerance: Patient tolerated treatment well;Patient limited by fatigue Patient left: in chair;with call bell/phone within reach;with chair alarm set Nurse Communication: Mobility status PT Visit Diagnosis: Unsteadiness on feet (R26.81);Other abnormalities of gait and mobility  (R26.89);Muscle weakness (generalized) (M62.81)     Time: 9323-5573 PT Time Calculation (min) (ACUTE ONLY): 36 min  Charges:  $Gait Training: 8-22 mins $Therapeutic Exercise: 8-22 mins                     9:57 AM, 06/02/20 Pamela Stein, MPT Physical Therapist with [facility] 336 [phone] office [phone] mobile phone

## 2020-06-02 NOTE — Progress Notes (Signed)
Subjective:  POD 5 2 Days Post-Op Procedure(s) (LRB): WOUND EXPLORATION AND IRRIGATION LEFT KNEE (Left) Patient reports pain as 1 on 0-10 scale.    Objective: Vital signs in last 24 hours: Temp:  [98.5 F (36.9 C)-99 F (37.2 C)] 98.9 F (37.2 C) (04/24 0514) Pulse Rate:  [73-84] 82 (04/24 0514) Resp:  [18] 18 (04/24 0514) BP: (121-133)/(61-65) 133/61 (04/24 0514) SpO2:  [93 %-97 %] 93 % (04/24 0514)  Intake/Output from previous day: 04/23 0701 - 04/24 0700 In: 640 [P.O.:640] Out: 0  Intake/Output this shift: No intake/output data recorded.  No results for input(s): HGB in the last 72 hours. No results for input(s): WBC, RBC, HCT, PLT in the last 72 hours. No results for input(s): NA, K, CL, CO2, BUN, CREATININE, GLUCOSE, CALCIUM in the last 72 hours. No results for input(s): LABPT, INR in the last 72 hours.  Neurologically intact   Assessment/Plan: 2 Days Post-Op Procedure(s) (LRB): WOUND EXPLORATION AND IRRIGATION LEFT KNEE (Left)   LEFT TKA  POD # 5 Advance diet   D/C TOMORROW     Arther Abbott 06/02/2020, 10:44 AM

## 2020-06-03 ENCOUNTER — Encounter (HOSPITAL_COMMUNITY): Payer: Self-pay | Admitting: Orthopedic Surgery

## 2020-06-03 LAB — GLUCOSE, CAPILLARY
Glucose-Capillary: 126 mg/dL — ABNORMAL HIGH (ref 70–99)
Glucose-Capillary: 114 mg/dL — ABNORMAL HIGH (ref 70–99)

## 2020-06-03 NOTE — TOC Transition Note (Signed)
Transition of Care [facility]) - CM/SW Discharge Note   Patient Details  Name: Pamela Stein MRN: [id number] Date of Birth: 1955-11-01  [facility] CM/SW Contact:  Iona Beard, Caroline Phone Number: 06/03/2020, 9:18 AM   Clinical Narrative:    Pt cleared for discharge home with [facility] services. Pt requesting 3N1 and would like it from [place]. CSW made referral for pt, 3N1 wil be drop shipped to pts home. TOC signing off.   Final next level of care: [place] Barriers to Discharge: Barriers Resolved   Patient Goals and CMS Choice Patient states their goals for this hospitalization and ongoing recovery are:: Home with [facility] and DME CMS Medicare.gov Compare Post Acute Care list provided to:: Patient Choice offered to / list presented to : Patient  Discharge Placement                       Discharge Plan and Services                DME Arranged: 3-N-1 DME Agency: [facility] Date DME Agency Contacted: 06/03/20 Time DME Agency Contacted: [phone] Representative spoke with at DME Agency: [place]: PT,OT [place]: [facility] (formerly [employer]) Date [place]: 05/30/20 Time [place]: 1007 Representative spoke with at [place]: Jannett Celestine.  Social Determinants of Health (SDOH) Interventions     Readmission Risk Interventions Readmission Risk Prevention Plan 06/03/2020  Medication Screening Complete  Transportation Screening Complete  Some recent data might be hidden

## 2020-06-03 NOTE — Progress Notes (Signed)
Physical Therapy Treatment Patient Details Name: Pamela Stein MRN: [id number] DOB: 05/24/1955 Today's Date: 06/03/2020  AMBULATION DISTANCE:133feet using RW withModified Independent/Supervision  History of Present Illness Pamela Stein is a 64 y/o female, s/p WOUND EXPLORATION AND IRRIGATION LEFT KNEE (Left) - irrigation wound exploration, wound closure on 05/31/20, s/p Left TKA on 05/28/20  with the diagnosis of Left knee osteoarthritis.    PT Comments    Patient demonstrates increased endurance/distance for gait training with fair return for left heel to toe stepping without loss of balance, followed with w/c for safety, but did not have to sit before returning to room.  Patient demonstrates good return for opening door to bathroom and transferring to raised toilet seat over commode and standing to wash hands over sink while using RW.  Patient tolerated staying up in chair after therapy.  Patient will benefit from continued physical therapy in hospital and recommended venue below to increase strength, balance, endurance for safe ADLs and gait.   Follow Up Recommendations  Home health PT;Supervision for mobility/OOB;Supervision - Intermittent     Equipment Recommendations  3in1 (PT)    Recommendations for Other Services       Precautions / Restrictions Precautions Precautions: Fall Required Braces or Orthoses: Knee Immobilizer - Left Knee Immobilizer - Left: On when out of bed or walking Restrictions Weight Bearing Restrictions: Yes LLE Weight Bearing: Weight bearing as tolerated Other Position/Activity Restrictions: no flexion of left knee    Mobility  Bed Mobility         Supine to sit: Supervision Sit to supine: Supervision   General bed mobility comments: Patient presents up in chair (assisted by nursing staff)    Transfers Overall transfer level: Needs assistance     Sit to Stand: Modified independent (Device/Increase time);Supervision Stand pivot  transfers: Modified independent (Device/Increase time);Supervision       General transfer comment: demonstrates good return for transferring to raised toliet seat in bathroom and to chair at bedside without loss of balance  Ambulation/Gait Ambulation/Gait assistance: Modified independent (Device/Increase time);Supervision Gait Distance (Feet): 175 Feet Assistive device: Rolling walker (2 wheeled) Gait Pattern/deviations: Decreased step length - left;Decreased stance time - left;Antalgic;Decreased stride length Gait velocity: decreased   General Gait Details: increased endurance/distance for ambulation with fair return for left heel to toe stepping, no loss of balance, followed with w/c for safety, but did not need to sit before returning to room   Stairs             Wheelchair Mobility    Modified Rankin (Stroke Patients Only)       Balance Overall balance assessment: Needs assistance Sitting-balance support: Feet supported;No upper extremity supported Sitting balance-Leahy Scale: Good Sitting balance - Comments: seated at edge of chair   Standing balance support: During functional activity;Bilateral upper extremity supported Standing balance-Leahy Scale: Fair Standing balance comment: fair/good using RW                            Cognition Arousal/Alertness: Awake/alert Behavior During Therapy: WFL for tasks assessed/performed Overall Cognitive Status: Within Functional Limits for tasks assessed                                        Exercises Total Joint Exercises Ankle Circles/Pumps: 15 reps;Both;Strengthening;AROM;Seated (long sitting in chair) Quad Sets: 10 reps;Left;Strengthening;AROM;Seated (long sitting in chair) Long  Arc Quad: Seated;AROM;Strengthening;Both;10 reps (long sitting in chair)    General Comments        Pertinent Vitals/Pain Pain Assessment: 0-10 Pain Score: 3  Pain Location: left knee Pain Descriptors /  Indicators: Sore Pain Intervention(s): Limited activity within patient's tolerance;Monitored during session    Home Living                      Prior Function            PT Goals (current goals can now be found in the care plan section) Acute Rehab PT Goals Patient Stated Goal: return home with friends/neighbors to assist PT Goal Formulation: With patient Time For Goal Achievement: 06/04/20 Potential to Achieve Goals: Good Progress towards PT goals: Progressing toward goals    Frequency    BID      PT Plan Current plan remains appropriate    Co-evaluation              AM-PAC PT "6 Clicks" Mobility   Outcome Measure  Help needed turning from your back to your side while in a flat bed without using bedrails?: None Help needed moving from lying on your back to sitting on the side of a flat bed without using bedrails?: None Help needed moving to and from a bed to a chair (including a wheelchair)?: A Little Help needed standing up from a chair using your arms (e.g., wheelchair or bedside chair)?: None Help needed to walk in hospital room?: A Little Help needed climbing 3-5 steps with a railing? : A Lot 6 Click Score: 20    End of Session   Activity Tolerance: Patient tolerated treatment well;Patient limited by fatigue Patient left: in chair;with call bell/phone within reach Nurse Communication: Mobility status PT Visit Diagnosis: Unsteadiness on feet (R26.81);Other abnormalities of gait and mobility (R26.89);Muscle weakness (generalized) (M62.81)     Time: 0355-9741 PT Time Calculation (min) (ACUTE ONLY): 32 min  Charges:  $Gait Training: 8-22 mins $Therapeutic Exercise: 8-22 mins                     10:57 AM, 06/03/20 Lonell Grandchild, MPT Physical Therapist with [facility] [phone] office [phone] mobile phone

## 2020-06-04 DIAGNOSIS — M1712 Unilateral primary osteoarthritis, left knee: Secondary | ICD-10-CM | POA: Diagnosis not present

## 2020-06-05 ENCOUNTER — Telehealth: Payer: Self-pay | Admitting: Orthopedic Surgery

## 2020-06-05 NOTE — Telephone Encounter (Signed)
Wbat   Slr in 4 directions   Quad sets   No rom for 2 weeks after 2nd surgery

## 2020-06-05 NOTE — Telephone Encounter (Signed)
I called him to advise. Left detailed VM

## 2020-06-05 NOTE — Telephone Encounter (Signed)
(  Continued) Pollyann Savoy, physical therapist from [facility] requests verbal orders for plan of care for home therapy. Ph# [phone]

## 2020-06-05 NOTE — Telephone Encounter (Signed)
Are you restricting her ROM??

## 2020-06-05 NOTE — Telephone Encounter (Signed)
Call received from [place], physical therapist with [facility] called to request verbal orders for plan of care for

## 2020-06-06 DIAGNOSIS — M1712 Unilateral primary osteoarthritis, left knee: Secondary | ICD-10-CM | POA: Diagnosis not present

## 2020-06-07 ENCOUNTER — Telehealth: Payer: Self-pay | Admitting: Orthopedic Surgery

## 2020-06-07 DIAGNOSIS — M1712 Unilateral primary osteoarthritis, left knee: Secondary | ICD-10-CM | POA: Diagnosis not present

## 2020-06-07 LAB — BPAM RBC
Blood Product Expiration Date: 202205042359
Blood Product Expiration Date: 202205062359
ISSUE DATE / TIME: 202204280938
Unit Type and Rh: 600
Unit Type and Rh: 6200

## 2020-06-07 LAB — TYPE AND SCREEN
ABO/RH(D): A POS
Antibody Screen: NEGATIVE
Unit division: 0
Unit division: 0

## 2020-06-07 NOTE — Telephone Encounter (Signed)
[facility] physical therapist Pollyann Savoy called to ask if patient is to be discharged to out patient therapy due to no range of motion for 2 weeks? Please call to advise [phone]

## 2020-06-10 DIAGNOSIS — Z7984 Long term (current) use of oral hypoglycemic drugs: Secondary | ICD-10-CM | POA: Diagnosis not present

## 2020-06-10 DIAGNOSIS — E785 Hyperlipidemia, unspecified: Secondary | ICD-10-CM | POA: Diagnosis not present

## 2020-06-10 DIAGNOSIS — M5416 Radiculopathy, lumbar region: Secondary | ICD-10-CM | POA: Diagnosis not present

## 2020-06-10 DIAGNOSIS — E559 Vitamin D deficiency, unspecified: Secondary | ICD-10-CM | POA: Diagnosis not present

## 2020-06-10 DIAGNOSIS — D239 Other benign neoplasm of skin, unspecified: Secondary | ICD-10-CM | POA: Diagnosis not present

## 2020-06-10 DIAGNOSIS — M5090 Cervical disc disorder, unspecified, unspecified cervical region: Secondary | ICD-10-CM | POA: Diagnosis not present

## 2020-06-10 DIAGNOSIS — Z96652 Presence of left artificial knee joint: Secondary | ICD-10-CM | POA: Diagnosis not present

## 2020-06-10 DIAGNOSIS — F419 Anxiety disorder, unspecified: Secondary | ICD-10-CM | POA: Diagnosis not present

## 2020-06-10 DIAGNOSIS — M109 Gout, unspecified: Secondary | ICD-10-CM | POA: Diagnosis not present

## 2020-06-10 DIAGNOSIS — G4733 Obstructive sleep apnea (adult) (pediatric): Secondary | ICD-10-CM | POA: Diagnosis not present

## 2020-06-10 DIAGNOSIS — M47818 Spondylosis without myelopathy or radiculopathy, sacral and sacrococcygeal region: Secondary | ICD-10-CM | POA: Diagnosis not present

## 2020-06-10 DIAGNOSIS — Z9181 History of falling: Secondary | ICD-10-CM | POA: Diagnosis not present

## 2020-06-10 DIAGNOSIS — Z7982 Long term (current) use of aspirin: Secondary | ICD-10-CM | POA: Diagnosis not present

## 2020-06-10 DIAGNOSIS — E210 Primary hyperparathyroidism: Secondary | ICD-10-CM | POA: Diagnosis not present

## 2020-06-10 DIAGNOSIS — E1140 Type 2 diabetes mellitus with diabetic neuropathy, unspecified: Secondary | ICD-10-CM | POA: Diagnosis not present

## 2020-06-10 NOTE — Telephone Encounter (Signed)
NO   Gait train for 2 weeks then start home PT

## 2020-06-10 NOTE — Telephone Encounter (Signed)
I called [name] told him to continue extra week  Sent message to [place] with outpatient to [facility] for next week

## 2020-06-11 ENCOUNTER — Telehealth (HOSPITAL_COMMUNITY): Payer: Self-pay | Admitting: Physical Therapy

## 2020-06-11 ENCOUNTER — Ambulatory Visit (HOSPITAL_COMMUNITY): Payer: Federal, State, Local not specified - PPO | Attending: Orthopedic Surgery | Admitting: Physical Therapy

## 2020-06-11 DIAGNOSIS — R2689 Other abnormalities of gait and mobility: Secondary | ICD-10-CM | POA: Insufficient documentation

## 2020-06-11 DIAGNOSIS — M6281 Muscle weakness (generalized): Secondary | ICD-10-CM | POA: Insufficient documentation

## 2020-06-11 DIAGNOSIS — Z9181 History of falling: Secondary | ICD-10-CM | POA: Insufficient documentation

## 2020-06-11 NOTE — Telephone Encounter (Signed)
S/w Angie at Dr. Ruthe Mannan office -after Dr. Ruthe Mannan evaluation of the pateint tomorrow -she will have the front office call us back tomorrow to advise on this patient's schedule and when home health will end.

## 2020-06-12 ENCOUNTER — Ambulatory Visit (INDEPENDENT_AMBULATORY_CARE_PROVIDER_SITE_OTHER): Payer: Federal, State, Local not specified - PPO | Admitting: Orthopedic Surgery

## 2020-06-12 ENCOUNTER — Other Ambulatory Visit: Payer: Self-pay

## 2020-06-12 ENCOUNTER — Encounter: Payer: Self-pay | Admitting: Orthopedic Surgery

## 2020-06-12 DIAGNOSIS — Z96652 Presence of left artificial knee joint: Secondary | ICD-10-CM

## 2020-06-12 NOTE — Patient Instructions (Signed)
The dressing can be removed Saturday  Keep the brace on until [name] comes to see you and then you can start bending the knee, start the CPM and remove the brace

## 2020-06-12 NOTE — Progress Notes (Signed)
Chief Complaint  Patient presents with  . Knee Injury    Left knee, DOS 05/28/20.    Left total knee attune fixed-bearing posterior stabilized on April 19  Postop complication fell with dehiscence of the left KNEEwound secondary to trauma with repeat surgery on April 22ND;  Irrigation and evacuation hematoma wound closure  WOUND: Clean staples removed  Knee looks great  Patient not having any pain  She will continue in the knee immobilizer weightbearing as tolerated until therapy sees her at which time she can start bending her knee

## 2020-06-13 ENCOUNTER — Ambulatory Visit (HOSPITAL_COMMUNITY): Payer: Federal, State, Local not specified - PPO | Admitting: Physical Therapy

## 2020-06-14 ENCOUNTER — Ambulatory Visit (HOSPITAL_COMMUNITY): Payer: Federal, State, Local not specified - PPO | Admitting: Physical Therapy

## 2020-06-14 DIAGNOSIS — E1140 Type 2 diabetes mellitus with diabetic neuropathy, unspecified: Secondary | ICD-10-CM | POA: Diagnosis not present

## 2020-06-14 DIAGNOSIS — E559 Vitamin D deficiency, unspecified: Secondary | ICD-10-CM | POA: Diagnosis not present

## 2020-06-14 DIAGNOSIS — Z96652 Presence of left artificial knee joint: Secondary | ICD-10-CM | POA: Diagnosis not present

## 2020-06-14 DIAGNOSIS — E785 Hyperlipidemia, unspecified: Secondary | ICD-10-CM | POA: Diagnosis not present

## 2020-06-14 DIAGNOSIS — Z7982 Long term (current) use of aspirin: Secondary | ICD-10-CM | POA: Diagnosis not present

## 2020-06-14 DIAGNOSIS — G4733 Obstructive sleep apnea (adult) (pediatric): Secondary | ICD-10-CM | POA: Diagnosis not present

## 2020-06-14 DIAGNOSIS — Z9181 History of falling: Secondary | ICD-10-CM | POA: Diagnosis not present

## 2020-06-14 DIAGNOSIS — M47818 Spondylosis without myelopathy or radiculopathy, sacral and sacrococcygeal region: Secondary | ICD-10-CM | POA: Diagnosis not present

## 2020-06-14 DIAGNOSIS — M5090 Cervical disc disorder, unspecified, unspecified cervical region: Secondary | ICD-10-CM | POA: Diagnosis not present

## 2020-06-14 DIAGNOSIS — E210 Primary hyperparathyroidism: Secondary | ICD-10-CM | POA: Diagnosis not present

## 2020-06-14 DIAGNOSIS — Z7984 Long term (current) use of oral hypoglycemic drugs: Secondary | ICD-10-CM | POA: Diagnosis not present

## 2020-06-14 DIAGNOSIS — D239 Other benign neoplasm of skin, unspecified: Secondary | ICD-10-CM | POA: Diagnosis not present

## 2020-06-14 DIAGNOSIS — M5416 Radiculopathy, lumbar region: Secondary | ICD-10-CM | POA: Diagnosis not present

## 2020-06-14 DIAGNOSIS — M109 Gout, unspecified: Secondary | ICD-10-CM | POA: Diagnosis not present

## 2020-06-14 DIAGNOSIS — F419 Anxiety disorder, unspecified: Secondary | ICD-10-CM | POA: Diagnosis not present

## 2020-06-17 ENCOUNTER — Ambulatory Visit (HOSPITAL_COMMUNITY): Payer: Federal, State, Local not specified - PPO | Admitting: Physical Therapy

## 2020-06-17 ENCOUNTER — Telehealth: Payer: Self-pay | Admitting: Orthopedic Surgery

## 2020-06-17 DIAGNOSIS — F419 Anxiety disorder, unspecified: Secondary | ICD-10-CM | POA: Diagnosis not present

## 2020-06-17 DIAGNOSIS — M47818 Spondylosis without myelopathy or radiculopathy, sacral and sacrococcygeal region: Secondary | ICD-10-CM | POA: Diagnosis not present

## 2020-06-17 DIAGNOSIS — D239 Other benign neoplasm of skin, unspecified: Secondary | ICD-10-CM | POA: Diagnosis not present

## 2020-06-17 DIAGNOSIS — M109 Gout, unspecified: Secondary | ICD-10-CM | POA: Diagnosis not present

## 2020-06-17 DIAGNOSIS — E785 Hyperlipidemia, unspecified: Secondary | ICD-10-CM | POA: Diagnosis not present

## 2020-06-17 DIAGNOSIS — G4733 Obstructive sleep apnea (adult) (pediatric): Secondary | ICD-10-CM | POA: Diagnosis not present

## 2020-06-17 DIAGNOSIS — Z9181 History of falling: Secondary | ICD-10-CM | POA: Diagnosis not present

## 2020-06-17 DIAGNOSIS — Z96652 Presence of left artificial knee joint: Secondary | ICD-10-CM | POA: Diagnosis not present

## 2020-06-17 DIAGNOSIS — E559 Vitamin D deficiency, unspecified: Secondary | ICD-10-CM | POA: Diagnosis not present

## 2020-06-17 DIAGNOSIS — E210 Primary hyperparathyroidism: Secondary | ICD-10-CM | POA: Diagnosis not present

## 2020-06-17 DIAGNOSIS — M5416 Radiculopathy, lumbar region: Secondary | ICD-10-CM | POA: Diagnosis not present

## 2020-06-17 DIAGNOSIS — E1140 Type 2 diabetes mellitus with diabetic neuropathy, unspecified: Secondary | ICD-10-CM | POA: Diagnosis not present

## 2020-06-17 DIAGNOSIS — Z7984 Long term (current) use of oral hypoglycemic drugs: Secondary | ICD-10-CM | POA: Diagnosis not present

## 2020-06-17 DIAGNOSIS — M5090 Cervical disc disorder, unspecified, unspecified cervical region: Secondary | ICD-10-CM | POA: Diagnosis not present

## 2020-06-17 DIAGNOSIS — Z7982 Long term (current) use of aspirin: Secondary | ICD-10-CM | POA: Diagnosis not present

## 2020-06-17 NOTE — Telephone Encounter (Signed)
Patient called and has some questions regarding CPM  Can you please her?  Thanks

## 2020-06-17 NOTE — Telephone Encounter (Signed)
She states therapy told her she does not need it, she is already flexing to 90 degrees. I have given her the number she will call and have company pick up the machine.   To you FYI

## 2020-06-19 ENCOUNTER — Ambulatory Visit (HOSPITAL_COMMUNITY): Payer: Federal, State, Local not specified - PPO | Admitting: Physical Therapy

## 2020-06-19 DIAGNOSIS — Z7982 Long term (current) use of aspirin: Secondary | ICD-10-CM | POA: Diagnosis not present

## 2020-06-19 DIAGNOSIS — D239 Other benign neoplasm of skin, unspecified: Secondary | ICD-10-CM | POA: Diagnosis not present

## 2020-06-19 DIAGNOSIS — Z96652 Presence of left artificial knee joint: Secondary | ICD-10-CM | POA: Diagnosis not present

## 2020-06-19 DIAGNOSIS — E1140 Type 2 diabetes mellitus with diabetic neuropathy, unspecified: Secondary | ICD-10-CM | POA: Diagnosis not present

## 2020-06-19 DIAGNOSIS — Z7984 Long term (current) use of oral hypoglycemic drugs: Secondary | ICD-10-CM | POA: Diagnosis not present

## 2020-06-19 DIAGNOSIS — E785 Hyperlipidemia, unspecified: Secondary | ICD-10-CM | POA: Diagnosis not present

## 2020-06-19 DIAGNOSIS — M5090 Cervical disc disorder, unspecified, unspecified cervical region: Secondary | ICD-10-CM | POA: Diagnosis not present

## 2020-06-19 DIAGNOSIS — E559 Vitamin D deficiency, unspecified: Secondary | ICD-10-CM | POA: Diagnosis not present

## 2020-06-19 DIAGNOSIS — G4733 Obstructive sleep apnea (adult) (pediatric): Secondary | ICD-10-CM | POA: Diagnosis not present

## 2020-06-19 DIAGNOSIS — E210 Primary hyperparathyroidism: Secondary | ICD-10-CM | POA: Diagnosis not present

## 2020-06-19 DIAGNOSIS — Z9181 History of falling: Secondary | ICD-10-CM | POA: Diagnosis not present

## 2020-06-19 DIAGNOSIS — M109 Gout, unspecified: Secondary | ICD-10-CM | POA: Diagnosis not present

## 2020-06-19 DIAGNOSIS — F419 Anxiety disorder, unspecified: Secondary | ICD-10-CM | POA: Diagnosis not present

## 2020-06-19 DIAGNOSIS — M5416 Radiculopathy, lumbar region: Secondary | ICD-10-CM | POA: Diagnosis not present

## 2020-06-19 DIAGNOSIS — M47818 Spondylosis without myelopathy or radiculopathy, sacral and sacrococcygeal region: Secondary | ICD-10-CM | POA: Diagnosis not present

## 2020-06-20 ENCOUNTER — Ambulatory Visit (HOSPITAL_COMMUNITY): Payer: Federal, State, Local not specified - PPO | Admitting: Physical Therapy

## 2020-06-21 DIAGNOSIS — M47818 Spondylosis without myelopathy or radiculopathy, sacral and sacrococcygeal region: Secondary | ICD-10-CM | POA: Diagnosis not present

## 2020-06-21 DIAGNOSIS — G4733 Obstructive sleep apnea (adult) (pediatric): Secondary | ICD-10-CM | POA: Diagnosis not present

## 2020-06-21 DIAGNOSIS — M109 Gout, unspecified: Secondary | ICD-10-CM | POA: Diagnosis not present

## 2020-06-21 DIAGNOSIS — D239 Other benign neoplasm of skin, unspecified: Secondary | ICD-10-CM | POA: Diagnosis not present

## 2020-06-21 DIAGNOSIS — E1140 Type 2 diabetes mellitus with diabetic neuropathy, unspecified: Secondary | ICD-10-CM | POA: Diagnosis not present

## 2020-06-21 DIAGNOSIS — M5416 Radiculopathy, lumbar region: Secondary | ICD-10-CM | POA: Diagnosis not present

## 2020-06-21 DIAGNOSIS — E210 Primary hyperparathyroidism: Secondary | ICD-10-CM | POA: Diagnosis not present

## 2020-06-21 DIAGNOSIS — Z96652 Presence of left artificial knee joint: Secondary | ICD-10-CM | POA: Diagnosis not present

## 2020-06-21 DIAGNOSIS — F419 Anxiety disorder, unspecified: Secondary | ICD-10-CM | POA: Diagnosis not present

## 2020-06-21 DIAGNOSIS — Z7982 Long term (current) use of aspirin: Secondary | ICD-10-CM | POA: Diagnosis not present

## 2020-06-21 DIAGNOSIS — Z7984 Long term (current) use of oral hypoglycemic drugs: Secondary | ICD-10-CM | POA: Diagnosis not present

## 2020-06-21 DIAGNOSIS — Z9181 History of falling: Secondary | ICD-10-CM | POA: Diagnosis not present

## 2020-06-21 DIAGNOSIS — E785 Hyperlipidemia, unspecified: Secondary | ICD-10-CM | POA: Diagnosis not present

## 2020-06-21 DIAGNOSIS — M5090 Cervical disc disorder, unspecified, unspecified cervical region: Secondary | ICD-10-CM | POA: Diagnosis not present

## 2020-06-21 DIAGNOSIS — E559 Vitamin D deficiency, unspecified: Secondary | ICD-10-CM | POA: Diagnosis not present

## 2020-06-24 ENCOUNTER — Encounter (HOSPITAL_COMMUNITY): Payer: Self-pay | Admitting: Physical Therapy

## 2020-06-24 ENCOUNTER — Other Ambulatory Visit: Payer: Self-pay

## 2020-06-24 ENCOUNTER — Ambulatory Visit (HOSPITAL_COMMUNITY): Payer: Federal, State, Local not specified - PPO | Admitting: Physical Therapy

## 2020-06-24 DIAGNOSIS — R2689 Other abnormalities of gait and mobility: Secondary | ICD-10-CM | POA: Diagnosis not present

## 2020-06-24 DIAGNOSIS — Z9181 History of falling: Secondary | ICD-10-CM

## 2020-06-24 DIAGNOSIS — M6281 Muscle weakness (generalized): Secondary | ICD-10-CM

## 2020-06-24 NOTE — Therapy (Signed)
[place], [name], [address] Phone: [phone]   Fax:  [phone]  Physical Therapy Evaluation  Patient Details  Name: Pamela Stein MRN: [id number] Date of Birth: 03/04/1955 Referring Provider (PT): Arther Abbott   Encounter Date: 06/24/2020   PT End of Session - 06/24/20 1038    Visit Number 1    Number of Visits 16    Date for PT Re-Evaluation 08/05/20    Authorization Type [employer] (no auth, [id number])    Authorization - Visit Number 1    Authorization - Number of Visits 42    Progress Note Due on Visit 10    PT Start Time 1000    PT Stop Time 1032    PT Time Calculation (min) 32 min    Activity Tolerance Patient tolerated treatment well;Patient limited by fatigue    Behavior During Therapy [facility] for tasks assessed/performed           Past Medical History:  Diagnosis Date  . Anxiety   . Back pain   . Bronchitis   . Diabetes mellitus   . Dizziness   . Heart murmur   . History of gout   . Hyperlipidemia   . Hypertension   . Neuropathy   . Obesity   . Obstructive sleep apnea    CPAP    Past Surgical History:  Procedure Laterality Date  . ABDOMINAL HYSTERECTOMY    . ADENOIDECTOMY    . CATARACT EXTRACTION Left    right 02/2018  . COLONOSCOPY  2008   diminutive rectal polyp, s/p removal. polypoid mucosa  . COLONOSCOPY WITH PROPOFOL N/A 01/03/2018   Procedure: COLONOSCOPY WITH PROPOFOL;  Surgeon: Daneil Dolin, MD;  Location: [facility] SUITE;  Service: Endoscopy;  Laterality: N/A;  12:00pm  . DILATION AND CURETTAGE OF UTERUS    . ENDOMETRIAL ABLATION    . EYE SURGERY Bilateral 12/04/2013   cataract  . PARATHYROIDECTOMY N/A 12/22/2016   Procedure: PARATHYROIDECTOMY, NECK EXPLORATION;  Surgeon: Jackolyn Confer, MD;  Location: [facility] ORS;  Service: General;  Laterality: N/A;  . TONSILLECTOMY    . TOTAL KNEE ARTHROPLASTY Left 05/28/2020   Procedure: LEFT TOTAL KNEE ARTHROPLASTY;  Surgeon:  Carole Civil, MD;  Location: [facility] ORS;  Service: Orthopedics;  Laterality: Left;  . WOUND EXPLORATION Left 05/31/2020   Procedure: WOUND EXPLORATION AND IRRIGATION LEFT KNEE;  Surgeon: Carole Civil, MD;  Location: [facility] ORS;  Service: Orthopedics;  Laterality: Left;  irrigation wound exploration, wound closure    There were no vitals filed for this visit.    Subjective Assessment - 06/24/20 1013    Subjective Reports that she had knee pain that started about 1.5 years ago and had therapy and decided to have a knee replacement.  States had TKA on 05/28/20 and then wound clean out on 05/31/20 due to a fall. States th MD had her in an immobilizer for 2 weeks following the fall.   States she has no current pain and is not taking any pain meds. States she would like to walk without the walker.    Pertinent History HTN, DB    Limitations Lifting;Standing;Walking;House hold activities    Patient Stated Goals to be able to get her left leg stronger so she doesn't have to use the walker.    Currently in Pain? No/denies              [facility] PT Assessment - 06/24/20 0001  Assessment   Medical Diagnosis L TKA    Referring Provider (PT) Arther Abbott    Onset Date/Surgical Date 05/28/20    Next MD Visit 07/03/20    Prior Therapy yes      Precautions   Precautions Fall      Balance Screen   Has the patient fallen in the past 6 months Yes    How many times? 6-7    Has the patient had a decrease in activity level because of a fear of falling?  Yes    Is the patient reluctant to leave their home because of a fear of falling?  No      Home Environment   Living Environment Private residence    Living Arrangements Alone    Available Help at Discharge Family;Friend(s)    Type of [place] Access Level entry    [place] One level    [place] - 2 wheels;[place] - single point      Prior Function   Level of Independence Independent    Vocation Full time  employment    Vocation Requirements reports no AD needed to walk, reports she has a long walk to her job it takes abotu 5-10 minutes to walk into her building.      Cognition   Overall Cognitive Status Within Functional Limits for tasks assessed      Observation/Other Assessments   Observations incision healing well, one scab noted along incision, dry skin noted, no signs of infection    Focus on Therapeutic Outcomes (FOTO)  39% function      Observation/Other Assessments-Edema    Edema --   mild swelling noted around knee left     ROM / Strength   AROM / PROM / Strength AROM      AROM   AROM Assessment Site Knee    Right/Left Knee Left;Right    Left Knee Extension 5   lacking   Left Knee Flexion 114      Ambulation/Gait   Ambulation/Gait Yes    Ambulation Distance (Feet) 120 Feet    Assistive device Rolling walker    Gait Pattern Decreased stance time - left;Decreased dorsiflexion - left;Decreased weight shift to left;Decreased hip/knee flexion - left    Ambulation Surface Level;Indoor    Gait Comments 2MWT                      Objective measurements completed on examination: See above findings.       [place] [facility] PT Treatment/Exercise - 06/24/20 0001      Knee/Hip Exercises: Stretches   Active Hamstring Stretch 3 reps;30 seconds;Left      Knee/Hip Exercises: Supine   Heel Slides AROM;Left;10 reps   10 second holds in extension adn flexion                 PT Education - 06/24/20 1038    Education Details on current condition, on POC and HEP    Person(s) Educated Patient    Methods Explanation    Comprehension Verbalized understanding            PT Short Term Goals - 06/24/20 1031      PT SHORT TERM GOAL #1   Title Patient will report at least 50% improvement in overall symptoms and/or function to demonstrate improved functional mobility    Time 3    Period Weeks    Status New    Target Date  07/15/20      PT SHORT TERM GOAL #2    Title Patient will demonstrate 0-120 degrees of left knee ROM    Time 3    Period Weeks    Status New    Target Date 07/15/20      PT SHORT TERM GOAL #3   Title Patient will be independent in self management strategies to improve quality of life and functional outcomes.    Time 3    Period Weeks    Status New    Target Date 07/15/20             PT Long Term Goals - 06/24/20 1035      PT LONG TERM GOAL #1   Title Patient will report at least 75% improvement in overall symptoms and/or function to demonstrate improved functional mobility    Time 6    Period Weeks    Status New    Target Date 08/05/20      PT LONG TERM GOAL #2   Title Patient will improve on FOTO score to meet predicted outcomes to demonstrate improved functional mobility.    Time 6    Period Weeks    Status New    Target Date 08/05/20      PT LONG TERM GOAL #3   Title Patient will be able to ambulate at least 350 feet in 2MWT with least restrictive assistive device in order to demonstrate improved gait speed for community ambulation.    Time 6    Period Weeks    Status New    Target Date 08/05/20                  Plan - 06/24/20 1020    Clinical Impression Statement Patient is a 65 y.o. female who presents to physical therapy after left TKA on 05/28/20 with subsequent wound clean our secondary to fall at hospital that opened up incision. Despite being in immobilizer for 2 weeks following fall, patient presents with good knee flexion on this date. Patient demonstrates decreased strength, ROM restriction, balance deficits and gait abnormalities which are likely contributing to symptoms of pain and are negatively impacting patient ability to perform ADLs and functional mobility tasks. Patient will benefit from skilled physical therapy services to address these deficits to reduce pain, improve level of function with ADLs, functional mobility tasks, and reduce risk for falls.    Personal Factors and  Comorbidities Age;Fitness;Behavior Pattern;Past/Current Experience;Comorbidity 3+;Time since onset of injury/illness/exacerbation    Comorbidities anxiety, DM, HTN    Examination-Activity Limitations Locomotion Level;Transfers;Squat;Stairs;Stand;Lift;Bend    Examination-Participation Restrictions Cleaning;Occupation;Meal Prep;Church;Community Activity;Shop;Volunteer;Yard Work    Stability/Clinical Decision Making Stable/Uncomplicated    Designer, jewellery Low    Rehab Potential Good    PT Frequency 3x / week    PT Duration 6 weeks    PT Treatment/Interventions ADLs/Self Care Home Management;Aquatic Therapy;Cryotherapy;Electrical Stimulation;Iontophoresis 4mg /ml Dexamethasone;Moist Heat;Traction;Ultrasound;DME Instruction;Gait training;Stair training;Functional mobility training;Therapeutic activities;Therapeutic exercise;Balance training;Neuromuscular re-education;Patient/family education;Manual techniques;Manual lymph drainage;Compression bandaging;Dry needling;Energy conservation;Spinal Manipulations;Joint Manipulations;Splinting;Taping;Passive range of motion    PT Next Visit Plan knee extension, weight shifts, gait, balance and knee flexion    PT Home Exercise Plan HH HEP, hamstring  stretch    Consulted and Agree with Plan of Care Patient           Patient will benefit from skilled therapeutic intervention in order to improve the following deficits and impairments:  Abnormal gait,Decreased endurance,Decreased activity tolerance,Pain,Decreased balance,Impaired flexibility,Improper body mechanics,Decreased strength,Decreased mobility,Difficulty walking,Decreased scar mobility,Decreased  skin integrity  Visit Diagnosis: Muscle weakness (generalized)  History of falling  Other abnormalities of gait and mobility     Problem List Patient Active Problem List   Diagnosis Date Noted  . Chronic pain of left knee 05/31/2020  . Wound dehiscence, surgical   . History of left knee  replacement 05/28/2020  . Primary osteoarthritis of left knee   . Polyneuropathy due to secondary diabetes mellitus ([place]) 05/25/2019  . Arthritis of right sacroiliac joint 05/25/2019  . Cervical disc disorder 05/25/2019  . Falls 05/25/2019  . General unsteadiness 05/25/2019  . Encounter for well woman exam with routine gynecological exam 01/09/2019  . Screening for colorectal cancer 01/09/2019  . Encounter for screening colonoscopy 12/13/2017  . Hyperparathyroidism, primary ([place]) 12/22/2016  . Vitamin D deficiency 10/31/2015  . Hyperuricemia 05/08/2013  . ONYCHOMYCOSIS, TOENAILS 02/22/2009  . NEVI, MULTIPLE 02/22/2009  . Diabetes mellitus ([place]) 01/25/2008  . BACK PAIN WITH RADICULOPATHY 11/09/2007  . Hyperlipidemia LDL goal <100 08/26/2007  . Morbid obesity ([place]) 08/26/2007  . Essential hypertension 08/26/2007  . Obstructive sleep apnea 04/20/2007   10:39 AM, 06/24/20 Jerene Pitch, DPT Physical Therapy with [facility]  [phone] office  [place] [address] [place], [name], [address] Phone: [phone]   Fax:  [phone]  Name: Pamela Stein MRN: [id number] Date of Birth: Oct 18, 1955

## 2020-06-26 ENCOUNTER — Ambulatory Visit (HOSPITAL_COMMUNITY): Payer: Federal, State, Local not specified - PPO | Admitting: Physical Therapy

## 2020-06-26 ENCOUNTER — Other Ambulatory Visit: Payer: Self-pay

## 2020-06-26 ENCOUNTER — Encounter (HOSPITAL_COMMUNITY): Payer: Self-pay | Admitting: Physical Therapy

## 2020-06-26 DIAGNOSIS — R2689 Other abnormalities of gait and mobility: Secondary | ICD-10-CM | POA: Diagnosis not present

## 2020-06-26 DIAGNOSIS — M6281 Muscle weakness (generalized): Secondary | ICD-10-CM

## 2020-06-26 DIAGNOSIS — Z9181 History of falling: Secondary | ICD-10-CM

## 2020-06-26 NOTE — Patient Instructions (Signed)
Access Code: [email or web address] URL: [email or web address] Date: 06/26/2020 Prepared by: Mitzi Hansen Oriyah Lamphear  Exercises Heel rises with counter support - 1 x daily - 7 x weekly - 2 sets - 10 reps Mini Squat with Counter Support - 1 x daily - 7 x weekly - 2 sets - 10 reps

## 2020-06-26 NOTE — Therapy (Signed)
[facility] [address] [place], [name], [address] Phone: [phone]   Fax:  [phone]  Physical Therapy Treatment  Patient Details  Name: Pamela Stein MRN: [id number] Date of Birth: 1955-09-12 Referring Provider (PT): Arther Abbott   Encounter Date: 06/26/2020   PT End of Session - 06/26/20 1002    Visit Number 2    Number of Visits 16    Date for PT Re-Evaluation 08/05/20    Authorization Type BCBS (no auth, VL [id number] used)    Authorization - Visit Number 2    Authorization - Number of Visits 42    Progress Note Due on Visit 10    PT Start Time 1002    PT Stop Time 1042    PT Time Calculation (min) 40 min    Activity Tolerance Patient tolerated treatment well;Patient limited by fatigue    Behavior During Therapy [facility] for tasks assessed/performed           Past Medical History:  Diagnosis Date  . Anxiety   . Back pain   . Bronchitis   . Diabetes mellitus   . Dizziness   . Heart murmur   . History of gout   . Hyperlipidemia   . Hypertension   . Neuropathy   . Obesity   . Obstructive sleep apnea    CPAP    Past Surgical History:  Procedure Laterality Date  . ABDOMINAL HYSTERECTOMY    . ADENOIDECTOMY    . CATARACT EXTRACTION Left    right 02/2018  . COLONOSCOPY  2008   diminutive rectal polyp, s/p removal. polypoid mucosa  . COLONOSCOPY WITH PROPOFOL N/A 01/03/2018   Procedure: COLONOSCOPY WITH PROPOFOL;  Surgeon: Daneil Dolin, MD;  [facility];  Service: Endoscopy;  Laterality: N/A;  12:00pm  . DILATION AND CURETTAGE OF UTERUS    . ENDOMETRIAL ABLATION    . EYE SURGERY Bilateral 12/04/2013   cataract  . PARATHYROIDECTOMY N/A 12/22/2016   Procedure: PARATHYROIDECTOMY, NECK EXPLORATION;  Surgeon: Jackolyn Confer, MD;  Location: [facility];  Service: General;  Laterality: N/A;  . TONSILLECTOMY    . TOTAL KNEE ARTHROPLASTY Left 05/28/2020   Procedure: LEFT TOTAL KNEE ARTHROPLASTY;  Surgeon:  Carole Civil, MD;  Location: [facility];  Service: Orthopedics;  Laterality: Left;  . WOUND EXPLORATION Left 05/31/2020   Procedure: WOUND EXPLORATION AND IRRIGATION LEFT KNEE;  Surgeon: Carole Civil, MD;  Location: [facility];  Service: Orthopedics;  Laterality: Left;  irrigation wound exploration, wound closure    There were no vitals filed for this visit.   Subjective Assessment - 06/26/20 1003    Subjective Patient states her exercises are going well. She is not having much pain but hamstrings are sore. She is walking up and down hallway every hour.    Pertinent History HTN, DB    Limitations Lifting;Standing;Walking;House hold activities    Patient Stated Goals to be able to get her left leg stronger so she doesn't have to use the walker.    Currently in Pain? No/denies                             [facility] Adult PT Treatment/Exercise - 06/26/20 0001      Knee/Hip Exercises: Stretches   Gastroc Stretch Both;3 reps;30 seconds    Gastroc Stretch Limitations slant board      Knee/Hip Exercises: Aerobic   Recumbent Bike 5 minutes for  dynamic warm up and ROM      Knee/Hip Exercises: Standing   Heel Raises Both;10 reps    Heel Raises Limitations TR 10x    Hip Abduction Both;10 reps;2 sets    Functional Squat 2 sets;10 reps    Functional Squat Limitations mini squat      Knee/Hip Exercises: Seated   Long Arc Quad 10 reps;Left    Long Arc Quad Limitations 5 second      Knee/Hip Exercises: Supine   Quad Sets 5 reps    Quad Sets Limitations 5 second    Short Arc Quad Sets Left;10 reps    Short Arc Quad Sets Limitations 5 seconds    Straight Leg Raises 10 reps    Straight Leg Raises Limitations with quad set    Knee Extension AROM    Knee Extension Limitations lacking 3    Knee Flexion AROM    Knee Flexion Limitations 123      Knee/Hip Exercises: Sidelying   Hip ABduction 5 reps                  PT Education - 06/26/20 1003    Education  Details HEP, exercise mechanics    Person(s) Educated Patient    Methods Explanation;Demonstration    Comprehension Verbalized understanding;Returned demonstration            PT Short Term Goals - 06/24/20 1031      PT SHORT TERM GOAL #1   Title Patient will report at least 50% improvement in overall symptoms and/or function to demonstrate improved functional mobility    Time 3    Period Weeks    Status New    Target Date 07/15/20      PT SHORT TERM GOAL #2   Title Patient will demonstrate 0-120 degrees of left knee ROM    Time 3    Period Weeks    Status New    Target Date 07/15/20      PT SHORT TERM GOAL #3   Title Patient will be independent in self management strategies to improve quality of life and functional outcomes.    Time 3    Period Weeks    Status New    Target Date 07/15/20             PT Long Term Goals - 06/24/20 1035      PT LONG TERM GOAL #1   Title Patient will report at least 75% improvement in overall symptoms and/or function to demonstrate improved functional mobility    Time 6    Period Weeks    Status New    Target Date 08/05/20      PT LONG TERM GOAL #2   Title Patient will improve on FOTO score to meet predicted outcomes to demonstrate improved functional mobility.    Time 6    Period Weeks    Status New    Target Date 08/05/20      PT LONG TERM GOAL #3   Title Patient will be able to ambulate at least 350 feet in 2MWT with least restrictive assistive device in order to demonstrate improved gait speed for community ambulation.    Time 6    Period Weeks    Status New    Target Date 08/05/20                 Plan - 06/26/20 1002    Clinical Impression Statement Patient able to complete full oscillations on bike  today for dynamic warm up and ROM. Patient showing improving ROM from lacking 3 to 123. Patient educated on scar massage and lotion to improve LLE dryness. Patient showing quad lag with SLR and tends to adduct hip  despite cueing for mechanics. Patient lacking significant quad strength lacking TKE with LAQ and SAQ and with extensor lag with attempting SLR. She requires manual and verbal cueing for hip abduction with poor carry over for mechanics. Patient with greatest deficit in quad strength and motor control. She remains a high risk for falls with intermittent buckling throughout session. Patient will continue to benefit from skilled physical therapy in order to reduce impairment and improve function.    Personal Factors and Comorbidities Age;Fitness;Behavior Pattern;Past/Current Experience;Comorbidity 3+;Time since onset of injury/illness/exacerbation    Comorbidities anxiety, DM, HTN    Examination-Activity Limitations Locomotion Level;Transfers;Squat;Stairs;Stand;Lift;Bend    Examination-Participation Restrictions Cleaning;Occupation;Meal Prep;Church;Community Activity;Shop;Volunteer;Yard Work    Stability/Clinical Decision Making Stable/Uncomplicated    Rehab Potential Good    PT Frequency 3x / week    PT Duration 6 weeks    PT Treatment/Interventions ADLs/Self Care Home Management;Aquatic Therapy;Cryotherapy;Electrical Stimulation;Iontophoresis 4mg /ml Dexamethasone;Moist Heat;Traction;Ultrasound;DME Instruction;Gait training;Stair training;Functional mobility training;Therapeutic activities;Therapeutic exercise;Balance training;Neuromuscular re-education;Patient/family education;Manual techniques;Manual lymph drainage;Compression bandaging;Dry needling;Energy conservation;Spinal Manipulations;Joint Manipulations;Splinting;Taping;Passive range of motion    PT Next Visit Plan knee extension, weight shifts, gait, balance and knee flexion    PT Home Exercise Plan HH HEP, hamstring  stretch, HR, mini squat at counter    Consulted and Agree with Plan of Care Patient           Patient will benefit from skilled therapeutic intervention in order to improve the following deficits and impairments:  Abnormal  gait,Decreased endurance,Decreased activity tolerance,Pain,Decreased balance,Impaired flexibility,Improper body mechanics,Decreased strength,Decreased mobility,Difficulty walking,Decreased scar mobility,Decreased skin integrity  Visit Diagnosis: Muscle weakness (generalized)  History of falling  Other abnormalities of gait and mobility     Problem List Patient Active Problem List   Diagnosis Date Noted  . Chronic pain of left knee 05/31/2020  . Wound dehiscence, surgical   . History of left knee replacement 05/28/2020  . Primary osteoarthritis of left knee   . Polyneuropathy due to secondary diabetes mellitus ([place]) 05/25/2019  . Arthritis of right sacroiliac joint 05/25/2019  . Cervical disc disorder 05/25/2019  . Falls 05/25/2019  . General unsteadiness 05/25/2019  . Encounter for well woman exam with routine gynecological exam 01/09/2019  . Screening for colorectal cancer 01/09/2019  . Encounter for screening colonoscopy 12/13/2017  . Hyperparathyroidism, primary ([place]) 12/22/2016  . Vitamin D deficiency 10/31/2015  . Hyperuricemia 05/08/2013  . ONYCHOMYCOSIS, TOENAILS 02/22/2009  . NEVI, MULTIPLE 02/22/2009  . Diabetes mellitus ([place]) 01/25/2008  . BACK PAIN WITH RADICULOPATHY 11/09/2007  . Hyperlipidemia LDL goal <100 08/26/2007  . Morbid obesity ([place]) 08/26/2007  . Essential hypertension 08/26/2007  . Obstructive sleep apnea 04/20/2007   10:50 AM, 06/26/20 Mearl Latin PT, DPT Physical Therapist at [place], [name], [address] Phone: [phone]   Fax:  [phone]  Name: Pamela Stein MRN: [id number] Date of Birth: [birth year]

## 2020-06-28 ENCOUNTER — Other Ambulatory Visit: Payer: Self-pay

## 2020-06-28 ENCOUNTER — Encounter (HOSPITAL_COMMUNITY): Payer: Self-pay

## 2020-06-28 ENCOUNTER — Ambulatory Visit (HOSPITAL_COMMUNITY): Payer: Federal, State, Local not specified - PPO

## 2020-06-28 DIAGNOSIS — M6281 Muscle weakness (generalized): Secondary | ICD-10-CM | POA: Diagnosis not present

## 2020-06-28 DIAGNOSIS — Z9181 History of falling: Secondary | ICD-10-CM

## 2020-06-28 DIAGNOSIS — R2689 Other abnormalities of gait and mobility: Secondary | ICD-10-CM | POA: Diagnosis not present

## 2020-06-28 NOTE — Therapy (Signed)
[facility] [place], [name], [address] Phone: [phone]   Fax:  [phone]  Physical Therapy Treatment  Patient Details  Name: Pamela Stein MRN: [id number] Date of Birth: 05/06/1955 Referring Provider (PT): Arther Abbott   Encounter Date: 06/28/2020   PT End of Session - 06/28/20 1104    Visit Number 3    Number of Visits 16    Date for PT Re-Evaluation 08/05/20    Authorization Type BCBS (no auth, VL [id number] used)    Authorization - Visit Number 3    Authorization - Number of Visits 42    Progress Note Due on Visit 10    PT Start Time 1052   late arrival   PT Stop Time 1140    PT Time Calculation (min) 48 min    Activity Tolerance Patient tolerated treatment well;Patient limited by fatigue    Behavior During Therapy [facility] for tasks assessed/performed           Past Medical History:  Diagnosis Date  . Anxiety   . Back pain   . Bronchitis   . Diabetes mellitus   . Dizziness   . Heart murmur   . History of gout   . Hyperlipidemia   . Hypertension   . Neuropathy   . Obesity   . Obstructive sleep apnea    CPAP    Past Surgical History:  Procedure Laterality Date  . ABDOMINAL HYSTERECTOMY    . ADENOIDECTOMY    . CATARACT EXTRACTION Left    right 02/2018  . COLONOSCOPY  2008   diminutive rectal polyp, s/p removal. polypoid mucosa  . COLONOSCOPY WITH PROPOFOL N/A 01/03/2018   Procedure: COLONOSCOPY WITH PROPOFOL;  Surgeon: Daneil Dolin, MD;  [facility];  Service: Endoscopy;  Laterality: N/A;  12:00pm  . DILATION AND CURETTAGE OF UTERUS    . ENDOMETRIAL ABLATION    . EYE SURGERY Bilateral 12/04/2013   cataract  . PARATHYROIDECTOMY N/A 12/22/2016   Procedure: PARATHYROIDECTOMY, NECK EXPLORATION;  Surgeon: Jackolyn Confer, MD;  [facility];  Service: General;  Laterality: N/A;  . TONSILLECTOMY    . TOTAL KNEE ARTHROPLASTY Left 05/28/2020   Procedure: LEFT TOTAL KNEE  ARTHROPLASTY;  Surgeon: Carole Civil, MD;  Location: [facility];  Service: Orthopedics;  Laterality: Left;  . WOUND EXPLORATION Left 05/31/2020   Procedure: WOUND EXPLORATION AND IRRIGATION LEFT KNEE;  Surgeon: Carole Civil, MD;  Location: [facility];  Service: Orthopedics;  Laterality: Left;  irrigation wound exploration, wound closure    There were no vitals filed for this visit.   Subjective Assessment - 06/28/20 1102    Subjective Pt stated her knee continues to give way while walking, no reports of pain.  Does have tightness in back of thigh.  Reports compliance with HEP 2x/daily,    Pertinent History HTN, DB    Patient Stated Goals to be able to get her left leg stronger so she doesn't have to use the walker.    Currently in Pain? No/denies    Pain Descriptors / Indicators Tightness                             [facility] Adult PT Treatment/Exercise - 06/28/20 0001      Ambulation/Gait   Ambulation/Gait Yes    Ambulation Distance (Feet) 200 Feet    Assistive device Rolling walker    Gait Pattern Decreased stance  time - left;Decreased dorsiflexion - left;Decreased weight shift to left;Decreased hip/knee flexion - left    Ambulation Surface Level;Indoor    Gait Comments c/o knee buckling x 2      Knee/Hip Exercises: Stretches   Active Hamstring Stretch 3 reps;30 seconds;Left    Active Hamstring Stretch Limitations supine wiht rope      Knee/Hip Exercises: Standing   Terminal Knee Extension Left;10 reps;Theraband    Theraband Level (Terminal Knee Extension) Level 3 (Green)    Terminal Knee Extension Limitations 5" holds      Knee/Hip Exercises: Seated   Long Arc Quad 3 sets;10 reps    Long Arc Quad Limitations 5 second      Knee/Hip Exercises: Supine   Quad Sets 3 sets;10 reps    [employer] Limitations used russian for [employer]    Heel Slides 3 sets;10 reps    Heel Slides Limitations difficult    Other Supine Knee/Hip Exercises NMR: [place] estim x 8 min  during SAQ, quad sets and Museum/gallery curator Lt proximal and distal quad    Printmaker Action improve contraction    Electrical Stimulation Parameters Russian estim per pt tolerance    Electrical Stimulation Goals Neuromuscular facilitation;Strength                    PT Short Term Goals - 06/24/20 1031      PT SHORT TERM GOAL #1   Title Patient will report at least 50% improvement in overall symptoms and/or function to demonstrate improved functional mobility    Time 3    Period Weeks    Status New    Target Date 07/15/20      PT SHORT TERM GOAL #2   Title Patient will demonstrate 0-120 degrees of left knee ROM    Time 3    Period Weeks    Status New    Target Date 07/15/20      PT SHORT TERM GOAL #3   Title Patient will be independent in self management strategies to improve quality of life and functional outcomes.    Time 3    Period Weeks    Status New    Target Date 07/15/20             PT Long Term Goals - 06/24/20 1035      PT LONG TERM GOAL #1   Title Patient will report at least 75% improvement in overall symptoms and/or function to demonstrate improved functional mobility    Time 6    Period Weeks    Status New    Target Date 08/05/20      PT LONG TERM GOAL #2   Title Patient will improve on FOTO score to meet predicted outcomes to demonstrate improved functional mobility.    Time 6    Period Weeks    Status New    Target Date 08/05/20      PT LONG TERM GOAL #3   Title Patient will be able to ambulate at least 350 feet in 2MWT with least restrictive assistive device in order to demonstrate improved gait speed for community ambulation.    Time 6    Period Weeks    Status New    Target Date 08/05/20                 Plan - 06/28/20  1154    Clinical Impression Statement Pt demonstrates decreased neuromusculature  facilitation for proper quad contraction, tendence to activate hamstrings and ER hip while therapist performed verbal and tactile cuieng to improve quad activatin.  This session used [place] estim for NMR to improve distal quad activation, AAROM required during SAQ.  Pt with improved quad sets following [place].  Given HEP for 100 quad sets a day.  Educated importance of strengthening this muscle to assist with knee buckling during gait.  Given printout.    Personal Factors and Comorbidities Age;Fitness;Behavior Pattern;Past/Current Experience;Comorbidity 3+;Time since onset of injury/illness/exacerbation    Comorbidities anxiety, DM, HTN    Examination-Activity Limitations Locomotion Level;Transfers;Squat;Stairs;Stand;Lift;Bend    Examination-Participation Restrictions Cleaning;Occupation;Meal Prep;Church;Community Activity;Shop;Volunteer;Yard Work    Stability/Clinical Decision Making Stable/Uncomplicated    Designer, jewellery Low    Rehab Potential Good    PT Frequency 3x / week    PT Duration 6 weeks    PT Treatment/Interventions ADLs/Self Care Home Management;Aquatic Therapy;Cryotherapy;Electrical Stimulation;Iontophoresis 4mg /ml Dexamethasone;Moist Heat;Traction;Ultrasound;DME Instruction;Gait training;Stair training;Functional mobility training;Therapeutic activities;Therapeutic exercise;Balance training;Neuromuscular re-education;Patient/family education;Manual techniques;Manual lymph drainage;Compression bandaging;Dry needling;Energy conservation;Spinal Manipulations;Joint Manipulations;Splinting;Taping;Passive range of motion    PT Next Visit Plan Progress note prior MD apt.  knee extension, weight shifts, gait, balance and knee flexion    PT Home Exercise Plan HH HEP, hamstring  stretch, HR, mini squat at counter    Consulted and Agree with Plan of Care Patient           Patient will benefit from skilled therapeutic intervention in order to improve the following deficits and  impairments:  Abnormal gait,Decreased endurance,Decreased activity tolerance,Pain,Decreased balance,Impaired flexibility,Improper body mechanics,Decreased strength,Decreased mobility,Difficulty walking,Decreased scar mobility,Decreased skin integrity  Visit Diagnosis: Muscle weakness (generalized)  History of falling  Other abnormalities of gait and mobility     Problem List Patient Active Problem List   Diagnosis Date Noted  . Chronic pain of left knee 05/31/2020  . Wound dehiscence, surgical   . History of left knee replacement 05/28/2020  . Primary osteoarthritis of left knee   . Polyneuropathy due to secondary diabetes mellitus ([place]) 05/25/2019  . Arthritis of right sacroiliac joint 05/25/2019  . Cervical disc disorder 05/25/2019  . Falls 05/25/2019  . General unsteadiness 05/25/2019  . Encounter for well woman exam with routine gynecological exam 01/09/2019  . Screening for colorectal cancer 01/09/2019  . Encounter for screening colonoscopy 12/13/2017  . Hyperparathyroidism, primary ([place]) 12/22/2016  . Vitamin D deficiency 10/31/2015  . Hyperuricemia 05/08/2013  . ONYCHOMYCOSIS, TOENAILS 02/22/2009  . NEVI, MULTIPLE 02/22/2009  . Diabetes mellitus ([place]) 01/25/2008  . BACK PAIN WITH RADICULOPATHY 11/09/2007  . Hyperlipidemia LDL goal <100 08/26/2007  . Morbid obesity ([place]) 08/26/2007  . Essential hypertension 08/26/2007  . Obstructive sleep apnea 04/20/2007   Ihor Austin, LPTA/CLT; CBIS [phone]  Aldona Lento 06/28/2020, 1:03 PM  [facility] [place], [name], [address] Phone: [phone]   Fax:  [phone]  Name: FRANCE LUSTY MRN: [id number] Date of Birth: 03-12-1955

## 2020-07-01 ENCOUNTER — Encounter (HOSPITAL_COMMUNITY): Payer: Self-pay

## 2020-07-01 ENCOUNTER — Other Ambulatory Visit: Payer: Self-pay

## 2020-07-01 ENCOUNTER — Ambulatory Visit (HOSPITAL_COMMUNITY): Payer: Federal, State, Local not specified - PPO

## 2020-07-01 DIAGNOSIS — M6281 Muscle weakness (generalized): Secondary | ICD-10-CM

## 2020-07-01 DIAGNOSIS — R2689 Other abnormalities of gait and mobility: Secondary | ICD-10-CM | POA: Diagnosis not present

## 2020-07-01 DIAGNOSIS — Z9181 History of falling: Secondary | ICD-10-CM

## 2020-07-01 NOTE — Therapy (Signed)
[facility] [address] [place], [name], [address] Phone: [phone]   Fax:  [phone]  Physical Therapy Treatment  Patient Details  Name: Pamela Stein MRN: [id number] Date of Birth: [birth year] Referring Provider (PT): Arther Abbott   Encounter Date: 07/01/2020   PT End of Session - 07/01/20 1525    Visit Number 4    Number of Visits 16    Date for PT Re-Evaluation 08/05/20    Authorization Type BCBS (no auth, [id number])    Authorization - Visit Number 4    Authorization - Number of Visits 42    Progress Note Due on Visit 10    PT Start Time 1515    PT Stop Time 1600    PT Time Calculation (min) 45 min    Activity Tolerance Patient tolerated treatment well;Patient limited by fatigue    Behavior During Therapy [facility] for tasks assessed/performed           Past Medical History:  Diagnosis Date  . Anxiety   . Back pain   . Bronchitis   . Diabetes mellitus   . Dizziness   . Heart murmur   . History of gout   . Hyperlipidemia   . Hypertension   . Neuropathy   . Obesity   . Obstructive sleep apnea    CPAP    Past Surgical History:  Procedure Laterality Date  . ABDOMINAL HYSTERECTOMY    . ADENOIDECTOMY    . CATARACT EXTRACTION Left    right 02/2018  . COLONOSCOPY  2008   diminutive rectal polyp, s/p removal. polypoid mucosa  . COLONOSCOPY WITH PROPOFOL N/A 01/03/2018   Procedure: COLONOSCOPY WITH PROPOFOL;  Surgeon: Daneil Dolin, MD;  [facility];  Service: Endoscopy;  Laterality: N/A;  12:00pm  . DILATION AND CURETTAGE OF UTERUS    . ENDOMETRIAL ABLATION    . EYE SURGERY Bilateral 12/04/2013   cataract  . PARATHYROIDECTOMY N/A 12/22/2016   Procedure: PARATHYROIDECTOMY, NECK EXPLORATION;  Surgeon: Jackolyn Confer, MD;  Location: [facility];  Service: General;  Laterality: N/A;  . TONSILLECTOMY    . TOTAL KNEE ARTHROPLASTY Left 05/28/2020   Procedure: LEFT TOTAL KNEE ARTHROPLASTY;  Surgeon:  Carole Civil, MD;  Location: [facility];  Service: Orthopedics;  Laterality: Left;  . WOUND EXPLORATION Left 05/31/2020   Procedure: WOUND EXPLORATION AND IRRIGATION LEFT KNEE;  Surgeon: Carole Civil, MD;  Location: [facility];  Service: Orthopedics;  Laterality: Left;  irrigation wound exploration, wound closure    There were no vitals filed for this visit.   Subjective Assessment - 07/01/20 1525    Subjective Pt reports no pain in the knee but notes the left knee still feels weak and difficulty with achieving last bit of extension    Pertinent History HTN, DB    Patient Stated Goals to be able to get her left leg stronger so she doesn't have to use the walker.    Currently in Pain? No/denies    Pain Score 0-No pain    Pain Location Knee              [facility] PT Assessment - 07/01/20 0001      Assessment   Medical Diagnosis L TKA    Referring Provider (PT) Arther Abbott    Onset Date/Surgical Date 05/28/20    Next MD Visit 07/03/20  [facility] Adult PT Treatment/Exercise - 07/01/20 0001      Knee/Hip Exercises: Standing   Terminal Knee Extension Strengthening;Left;5 sets;5 reps    Theraband Level (Terminal Knee Extension) Level 2 (Red)      Knee/Hip Exercises: Seated   Long Arc Quad Strengthening;Left;5 sets;5 reps    Long Arc Quad Weight 10 lbs.   3 sets 5 lbs, 2 sets 10 lbs     Knee/Hip Exercises: Supine   Quad Sets Strengthening;4 sets;10 reps    Short Arc Quad Sets Strengthening;Left;5 sets;5 reps   5 lbs performed as assisted negative (eccentric)   Short Arc Quad Sets Limitations 5 lbs    Bridges Strengthening;Both;5 sets;5 reps                    PT Short Term Goals - 06/24/20 1031      PT SHORT TERM GOAL #1   Title Patient will report at least 50% improvement in overall symptoms and/or function to demonstrate improved functional mobility    Time 3    Period Weeks    Status New    Target Date 07/15/20       PT SHORT TERM GOAL #2   Title Patient will demonstrate 0-120 degrees of left knee ROM    Time 3    Period Weeks    Status New    Target Date 07/15/20      PT SHORT TERM GOAL #3   Title Patient will be independent in self management strategies to improve quality of life and functional outcomes.    Time 3    Period Weeks    Status New    Target Date 07/15/20             PT Long Term Goals - 06/24/20 1035      PT LONG TERM GOAL #1   Title Patient will report at least 75% improvement in overall symptoms and/or function to demonstrate improved functional mobility    Time 6    Period Weeks    Status New    Target Date 08/05/20      PT LONG TERM GOAL #2   Title Patient will improve on FOTO score to meet predicted outcomes to demonstrate improved functional mobility.    Time 6    Period Weeks    Status New    Target Date 08/05/20      PT LONG TERM GOAL #3   Title Patient will be able to ambulate at least 350 feet in 2MWT with least restrictive assistive device in order to demonstrate improved gait speed for community ambulation.    Time 6    Period Weeks    Status New    Target Date 08/05/20                 Plan - 07/01/20 1533    Clinical Impression Statement Difficulty in achieving left knee terminal knee extension in open chain and closed chain with palpable weakness along distal quadriceps and minimal VMO activity appreciated.  Improved performance with assistance negatives (eccentrics) for activation.  Continued POC indicated to improve left quadriceps strength and progress functional strength/balance to decrease need for AD for ambulation    Personal Factors and Comorbidities Age;Fitness;Behavior Pattern;Past/Current Experience;Comorbidity 3+;Time since onset of injury/illness/exacerbation    Comorbidities anxiety, DM, HTN    Examination-Activity Limitations Locomotion Level;Transfers;Squat;Stairs;Stand;Lift;Bend    Examination-Participation Restrictions  Cleaning;Occupation;Meal Prep;Church;Community Activity;Shop;Volunteer;Yard Work    Stability/Clinical Decision Making Stable/Uncomplicated    Rehab Potential  Good    PT Frequency 3x / week    PT Duration 6 weeks    PT Treatment/Interventions ADLs/Self Care Home Management;Aquatic Therapy;Cryotherapy;Electrical Stimulation;Iontophoresis 4mg /ml Dexamethasone;Moist Heat;Traction;Ultrasound;DME Instruction;Gait training;Stair training;Functional mobility training;Therapeutic activities;Therapeutic exercise;Balance training;Neuromuscular re-education;Patient/family education;Manual techniques;Manual lymph drainage;Compression bandaging;Dry needling;Energy conservation;Spinal Manipulations;Joint Manipulations;Splinting;Taping;Passive range of motion    PT Next Visit Plan Progress note prior MD apt.  knee extension, weight shifts, gait, balance and knee flexion    PT Home Exercise Plan HH HEP, hamstring  stretch, HR, mini squat at counter    Consulted and Agree with Plan of Care Patient           Patient will benefit from skilled therapeutic intervention in order to improve the following deficits and impairments:  Abnormal gait,Decreased endurance,Decreased activity tolerance,Pain,Decreased balance,Impaired flexibility,Improper body mechanics,Decreased strength,Decreased mobility,Difficulty walking,Decreased scar mobility,Decreased skin integrity  Visit Diagnosis: Muscle weakness (generalized)  History of falling  Other abnormalities of gait and mobility     Problem List Patient Active Problem List   Diagnosis Date Noted  . Chronic pain of left knee 05/31/2020  . Wound dehiscence, surgical   . History of left knee replacement 05/28/2020  . Primary osteoarthritis of left knee   . Polyneuropathy due to secondary diabetes mellitus ([place]) 05/25/2019  . Arthritis of right sacroiliac joint 05/25/2019  . Cervical disc disorder 05/25/2019  . Falls 05/25/2019  . General unsteadiness 05/25/2019   . Encounter for well woman exam with routine gynecological exam 01/09/2019  . Screening for colorectal cancer 01/09/2019  . Encounter for screening colonoscopy 12/13/2017  . Hyperparathyroidism, primary ([place]) 12/22/2016  . Vitamin D deficiency 10/31/2015  . Hyperuricemia 05/08/2013  . ONYCHOMYCOSIS, TOENAILS 02/22/2009  . NEVI, MULTIPLE 02/22/2009  . Diabetes mellitus ([place]) 01/25/2008  . BACK PAIN WITH RADICULOPATHY 11/09/2007  . Hyperlipidemia LDL goal <100 08/26/2007  . Morbid obesity ([place]) 08/26/2007  . Essential hypertension 08/26/2007  . Obstructive sleep apnea 04/20/2007   4:08 PM, 07/01/20 M. Sherlyn Lees, PT, DPT Physical Therapist- [place] Office Number: [phone]  [facility] [address] [place], [name], [address] Phone: [phone]   Fax:  [phone]  Name: LAKASHIA COLLISON MRN: [id number] Date of Birth: 1955/03/14

## 2020-07-03 ENCOUNTER — Encounter: Payer: Self-pay | Admitting: Orthopedic Surgery

## 2020-07-03 ENCOUNTER — Ambulatory Visit (HOSPITAL_COMMUNITY): Payer: Federal, State, Local not specified - PPO | Admitting: Physical Therapy

## 2020-07-03 ENCOUNTER — Encounter (HOSPITAL_COMMUNITY): Payer: Self-pay | Admitting: Physical Therapy

## 2020-07-03 ENCOUNTER — Ambulatory Visit (INDEPENDENT_AMBULATORY_CARE_PROVIDER_SITE_OTHER): Payer: Federal, State, Local not specified - PPO | Admitting: Orthopedic Surgery

## 2020-07-03 ENCOUNTER — Other Ambulatory Visit: Payer: Self-pay

## 2020-07-03 VITALS — BP 152/86 | HR 79 | Ht 62.5 in

## 2020-07-03 DIAGNOSIS — R2689 Other abnormalities of gait and mobility: Secondary | ICD-10-CM | POA: Diagnosis not present

## 2020-07-03 DIAGNOSIS — Z96652 Presence of left artificial knee joint: Secondary | ICD-10-CM

## 2020-07-03 DIAGNOSIS — M6281 Muscle weakness (generalized): Secondary | ICD-10-CM | POA: Diagnosis not present

## 2020-07-03 DIAGNOSIS — Z9181 History of falling: Secondary | ICD-10-CM

## 2020-07-03 NOTE — Patient Instructions (Signed)
Brace for walking   Extend oow to 6 weeks from now

## 2020-07-03 NOTE — Progress Notes (Signed)
Chief Complaint  Patient presents with  . Knee Pain    Left knee DOS 05/28/20, 05/31/20.    Encounter Diagnosis  Name Primary?  . Status post total left knee replacement 05/28/2020, Incision opened after fall 4/22/202 I and D wound closure  Yes    65 year old female postop left total knee this is approximately 4 to 5 weeks past surgery.  Postop course complicated by fall in the hospital which led to an opening of her incision.  She had to have a second irrigation and evacuation of hematoma and wound closure  She is doing well except she has an extensor lag and weakness in her left quad  She has no signs of infection wound healed up nicely she can flex the knee well she has 20 degree extensor lag she can do a straight leg raise with same extensor lag  With the knee in flexion the tendon is fully palpable including quadriceps and patellar tendon  Recommend playmaker brace continue therapy follow-up in 6 to 8 weeks

## 2020-07-03 NOTE — Therapy (Signed)
[facility] [address] [place], [name], [address] Phone: [phone]   Fax:  [phone]  Physical Therapy Treatment  Patient Details  Name: Pamela Stein MRN: [id number] Date of Birth: [birth year] Referring Provider (PT): Arther Abbott   Encounter Date: 07/03/2020   PT End of Session - 07/03/20 0959    Visit Number 5    Number of Visits 16    Date for PT Re-Evaluation 08/05/20    Authorization Type BCBS (no auth, VL [id number] used)    Authorization - Visit Number 5    Authorization - Number of Visits 42    Progress Note Due on Visit 10    PT Start Time 1000    PT Stop Time 1040    PT Time Calculation (min) 40 min    Activity Tolerance Patient tolerated treatment well;Patient limited by fatigue    Behavior During Therapy [facility] for tasks assessed/performed           Past Medical History:  Diagnosis Date  . Anxiety   . Back pain   . Bronchitis   . Diabetes mellitus   . Dizziness   . Heart murmur   . History of gout   . Hyperlipidemia   . Hypertension   . Neuropathy   . Obesity   . Obstructive sleep apnea    CPAP    Past Surgical History:  Procedure Laterality Date  . ABDOMINAL HYSTERECTOMY    . ADENOIDECTOMY    . CATARACT EXTRACTION Left    right 02/2018  . COLONOSCOPY  2008   diminutive rectal polyp, s/p removal. polypoid mucosa  . COLONOSCOPY WITH PROPOFOL N/A 01/03/2018   Procedure: COLONOSCOPY WITH PROPOFOL;  Surgeon: Daneil Dolin, MD;  [facility];  Service: Endoscopy;  Laterality: N/A;  12:00pm  . DILATION AND CURETTAGE OF UTERUS    . ENDOMETRIAL ABLATION    . EYE SURGERY Bilateral 12/04/2013   cataract  . PARATHYROIDECTOMY N/A 12/22/2016   Procedure: PARATHYROIDECTOMY, NECK EXPLORATION;  Surgeon: Jackolyn Confer, MD;  Location: [facility];  Service: General;  Laterality: N/A;  . TONSILLECTOMY    . TOTAL KNEE ARTHROPLASTY Left 05/28/2020   Procedure: LEFT TOTAL KNEE ARTHROPLASTY;  Surgeon:  Carole Civil, MD;  Location: [facility];  Service: Orthopedics;  Laterality: Left;  . WOUND EXPLORATION Left 05/31/2020   Procedure: WOUND EXPLORATION AND IRRIGATION LEFT KNEE;  Surgeon: Carole Civil, MD;  Location: [facility];  Service: Orthopedics;  Laterality: Left;  irrigation wound exploration, wound closure    There were no vitals filed for this visit.   Subjective Assessment - 07/03/20 1000    Subjective Patient went to [facility] this morning. They gave her a brace to wear when walking. States her knee has been feeling alright. Knee gives her trouble at night.    Pertinent History HTN, DB    Patient Stated Goals to be able to get her left leg stronger so she doesn't have to use the walker.    Currently in Pain? No/denies                             [facility] Adult PT Treatment/Exercise - 07/03/20 0001      Knee/Hip Exercises: Standing   Heel Raises Both;1 set;20 reps    Terminal Knee Extension Left;2 sets;10 reps    Terminal Knee Extension Limitations 5 second holds thick blue band      Knee/Hip  Exercises: Seated   Long Arc Quad Left;2 sets;AAROM;10 reps    Long [employer] Limitations 5 second hold eccentric control      Knee/Hip Exercises: Supine   Quad Sets Left;2 sets;5 reps    [employer] Limitations 10 second holds    Short Arc [employer] Left;10 reps;2 sets    [employer] Limitations 5 lbs, AAROM for TKE with cueing for eccentric control    Straight Leg Raises 10 reps    Straight Leg Raises Limitations with quad set    Knee Extension AROM    Knee Extension Limitations 0    Knee Flexion AROM    Knee Flexion Limitations 121                  PT Education - 07/03/20 1000    Education Details HEP    Person(s) Educated Patient    Methods Explanation;Demonstration    Comprehension Verbalized understanding;Returned demonstration            PT Short Term Goals - 06/24/20 1031      PT SHORT TERM GOAL #1   Title Patient will report at  least 50% improvement in overall symptoms and/or function to demonstrate improved functional mobility    Time 3    Period Weeks    Status New    Target Date 07/15/20      PT SHORT TERM GOAL #2   Title Patient will demonstrate 0-120 degrees of left knee ROM    Time 3    Period Weeks    Status New    Target Date 07/15/20      PT SHORT TERM GOAL #3   Title Patient will be independent in self management strategies to improve quality of life and functional outcomes.    Time 3    Period Weeks    Status New    Target Date 07/15/20             PT Long Term Goals - 06/24/20 1035      PT LONG TERM GOAL #1   Title Patient will report at least 75% improvement in overall symptoms and/or function to demonstrate improved functional mobility    Time 6    Period Weeks    Status New    Target Date 08/05/20      PT LONG TERM GOAL #2   Title Patient will improve on FOTO score to meet predicted outcomes to demonstrate improved functional mobility.    Time 6    Period Weeks    Status New    Target Date 08/05/20      PT LONG TERM GOAL #3   Title Patient will be able to ambulate at least 350 feet in 2MWT with least restrictive assistive device in order to demonstrate improved gait speed for community ambulation.    Time 6    Period Weeks    Status New    Target Date 08/05/20                 Plan - 07/03/20 0959    Clinical Impression Statement Patient showing good ROM from 0 to 121 today. She is showing good quad set but quad continues to be very weak. She requires assist with SAQ secondary to weakness and completes with cueing for eccentric control. She demonstrates about 15 to 20 degrees of extension lag with SLR. Patient completes LAQ with AAROM for extension and cueing for eccentric control. Patient will continue to benefit from  skilled physical therapy in order to reduce impairment and improve function.    Personal Factors and Comorbidities Age;Fitness;Behavior  Pattern;Past/Current Experience;Comorbidity 3+;Time since onset of injury/illness/exacerbation    Comorbidities anxiety, DM, HTN    Examination-Activity Limitations Locomotion Level;Transfers;Squat;Stairs;Stand;Lift;Bend    Examination-Participation Restrictions Cleaning;Occupation;Meal Prep;Church;Community Activity;Shop;Volunteer;Yard Work    Stability/Clinical Decision Making Stable/Uncomplicated    Rehab Potential Good    PT Frequency 3x / week    PT Duration 6 weeks    PT Treatment/Interventions ADLs/Self Care Home Management;Aquatic Therapy;Cryotherapy;Electrical Stimulation;Iontophoresis 4mg /ml Dexamethasone;Moist Heat;Traction;Ultrasound;DME Instruction;Gait training;Stair training;Functional mobility training;Therapeutic activities;Therapeutic exercise;Balance training;Neuromuscular re-education;Patient/family education;Manual techniques;Manual lymph drainage;Compression bandaging;Dry needling;Energy conservation;Spinal Manipulations;Joint Manipulations;Splinting;Taping;Passive range of motion    PT Next Visit Plan quad strength, knee extension, weight shifts, gait, balance and knee flexion    PT Home Exercise Plan HH HEP, hamstring  stretch, HR, mini squat at counter    Consulted and Agree with Plan of Care Patient           Patient will benefit from skilled therapeutic intervention in order to improve the following deficits and impairments:  Abnormal gait,Decreased endurance,Decreased activity tolerance,Pain,Decreased balance,Impaired flexibility,Improper body mechanics,Decreased strength,Decreased mobility,Difficulty walking,Decreased scar mobility,Decreased skin integrity  Visit Diagnosis: Muscle weakness (generalized)  History of falling  Other abnormalities of gait and mobility     Problem List Patient Active Problem List   Diagnosis Date Noted  . Chronic pain of left knee 05/31/2020  . Wound dehiscence, surgical   . Status post total left knee replacement 05/28/2020   . Primary osteoarthritis of left knee   . Polyneuropathy due to secondary diabetes mellitus ([place]) 05/25/2019  . Arthritis of right sacroiliac joint 05/25/2019  . Cervical disc disorder 05/25/2019  . Falls 05/25/2019  . General unsteadiness 05/25/2019  . Encounter for well woman exam with routine gynecological exam 01/09/2019  . Screening for colorectal cancer 01/09/2019  . Encounter for screening colonoscopy 12/13/2017  . Hyperparathyroidism, primary ([place]) 12/22/2016  . Vitamin D deficiency 10/31/2015  . Hyperuricemia 05/08/2013  . ONYCHOMYCOSIS, TOENAILS 02/22/2009  . NEVI, MULTIPLE 02/22/2009  . Diabetes mellitus ([place]) 01/25/2008  . BACK PAIN WITH RADICULOPATHY 11/09/2007  . Hyperlipidemia LDL goal <100 08/26/2007  . Morbid obesity ([place]) 08/26/2007  . Essential hypertension 08/26/2007  . Obstructive sleep apnea 04/20/2007   10:41 AM, 07/03/20 Mearl Latin PT, DPT Physical Therapist at [facility] [place], [name], [address] Phone: [phone]   Fax:  [phone]  Name: NAKEITA STYLES MRN: [id number] Date of Birth: [birth year]

## 2020-07-05 ENCOUNTER — Ambulatory Visit (HOSPITAL_COMMUNITY): Payer: Federal, State, Local not specified - PPO

## 2020-07-05 ENCOUNTER — Encounter (HOSPITAL_COMMUNITY): Payer: Self-pay

## 2020-07-05 ENCOUNTER — Other Ambulatory Visit: Payer: Self-pay

## 2020-07-05 DIAGNOSIS — Z9181 History of falling: Secondary | ICD-10-CM | POA: Diagnosis not present

## 2020-07-05 DIAGNOSIS — M6281 Muscle weakness (generalized): Secondary | ICD-10-CM

## 2020-07-05 DIAGNOSIS — R2689 Other abnormalities of gait and mobility: Secondary | ICD-10-CM | POA: Diagnosis not present

## 2020-07-05 NOTE — Therapy (Signed)
[place], [name], [address] Phone: [phone]   Fax:  [phone]  Physical Therapy Treatment  Patient Details  Name: Pamela Stein MRN: [id number] Date of Birth: [birth year] Referring Provider (PT): Arther Abbott   Encounter Date: 07/05/2020   PT End of Session - 07/05/20 1012    Visit Number 6    Number of Visits 16    Date for PT Re-Evaluation 08/05/20    Authorization Type BCBS (no auth, VL [id number] used)    Authorization - Visit Number 6    Authorization - Number of Visits 42    Progress Note Due on Visit 10    PT Start Time 1008   late arrival   PT Stop Time 1055    PT Time Calculation (min) 47 min    Activity Tolerance Patient tolerated treatment well;Patient limited by fatigue    Behavior During Therapy [facility] for tasks assessed/performed           Past Medical History:  Diagnosis Date  . Anxiety   . Back pain   . Bronchitis   . Diabetes mellitus   . Dizziness   . Heart murmur   . History of gout   . Hyperlipidemia   . Hypertension   . Neuropathy   . Obesity   . Obstructive sleep apnea    CPAP    Past Surgical History:  Procedure Laterality Date  . ABDOMINAL HYSTERECTOMY    . ADENOIDECTOMY    . CATARACT EXTRACTION Left    right 02/2018  . COLONOSCOPY  2008   diminutive rectal polyp, s/p removal. polypoid mucosa  . COLONOSCOPY WITH PROPOFOL N/A 01/03/2018   Procedure: COLONOSCOPY WITH PROPOFOL;  Surgeon: Daneil Dolin, MD;  [facility];  Service: Endoscopy;  Laterality: N/A;  12:00pm  . DILATION AND CURETTAGE OF UTERUS    . ENDOMETRIAL ABLATION    . EYE SURGERY Bilateral 12/04/2013   cataract  . PARATHYROIDECTOMY N/A 12/22/2016   Procedure: PARATHYROIDECTOMY, NECK EXPLORATION;  Surgeon: Jackolyn Confer, MD;  Location: [facility];  Service: General;  Laterality: N/A;  . TONSILLECTOMY    . TOTAL KNEE ARTHROPLASTY Left 05/28/2020   Procedure: LEFT TOTAL KNEE  ARTHROPLASTY;  Surgeon: Carole Civil, MD;  Location: [facility];  Service: Orthopedics;  Laterality: Left;  . WOUND EXPLORATION Left 05/31/2020   Procedure: WOUND EXPLORATION AND IRRIGATION LEFT KNEE;  Surgeon: Carole Civil, MD;  Location: [facility];  Service: Orthopedics;  Laterality: Left;  irrigation wound exploration, wound closure    There were no vitals filed for this visit.   Subjective Assessment - 07/05/20 1010    Subjective No reports of pain, knee just feels weak today.  MD gave brace to wear while walking.    Pertinent History HTN, DB    Patient Stated Goals to be able to get her left leg stronger so she doesn't have to use the walker.    Currently in Pain? No/denies                             [facility] Adult PT Treatment/Exercise - 07/05/20 0001      Knee/Hip Exercises: Standing   Heel Raises Both;1 set;20 reps    Terminal Knee Extension Left;2 sets;10 reps    Theraband Level (Terminal Knee Extension) Level 3 (Green)    Terminal Knee Extension Limitations 5 second holds thick blue band  Knee/Hip Exercises: Seated   Long Arc Quad AROM;Left;2 sets;10 reps    Long [employer] Limitations 5-10" holds      Knee/Hip Exercises: Supine   Quad Sets 2 sets;10 reps    Quad Sets Limitations 10" holds, tactile cueing to reduce gluteal activation and improve distal quad    Short Arc [employer] Left;10 reps;2 sets    [employer] Limitations 5" eccentric control; AAROM end range    Bridges 2 sets;15 reps    Bridges Limitations GTB around thigh    Straight Leg Raises 10 reps;2 sets    Straight Leg Raises Limitations with quad set; extension lag 20 degree flexion      Knee/Hip Exercises: Prone   Hip Extension Both;10 reps                    PT Short Term Goals - 06/24/20 1031      PT SHORT TERM GOAL #1   Title Patient will report at least 50% improvement in overall symptoms and/or function to demonstrate improved functional mobility     Time 3    Period Weeks    Status New    Target Date 07/15/20      PT SHORT TERM GOAL #2   Title Patient will demonstrate 0-120 degrees of left knee ROM    Time 3    Period Weeks    Status New    Target Date 07/15/20      PT SHORT TERM GOAL #3   Title Patient will be independent in self management strategies to improve quality of life and functional outcomes.    Time 3    Period Weeks    Status New    Target Date 07/15/20             PT Long Term Goals - 06/24/20 1035      PT LONG TERM GOAL #1   Title Patient will report at least 75% improvement in overall symptoms and/or function to demonstrate improved functional mobility    Time 6    Period Weeks    Status New    Target Date 08/05/20      PT LONG TERM GOAL #2   Title Patient will improve on FOTO score to meet predicted outcomes to demonstrate improved functional mobility.    Time 6    Period Weeks    Status New    Target Date 08/05/20      PT LONG TERM GOAL #3   Title Patient will be able to ambulate at least 350 feet in 2MWT with least restrictive assistive device in order to demonstrate improved gait speed for community ambulation.    Time 6    Period Weeks    Status New    Target Date 08/05/20                 Plan - 07/05/20 1023    Clinical Impression Statement Pt continues to demonstrate significantly weakness especially distal quadricpeps and gluteal.  Tactile and verbal cueing for proper mechanics with quad sets as tendency to utilize gluteal hip extension instead of distal quad for knee extension due to weakness.  Strength is improving with ability to raise foot during SAQ, AAROM end range.  No reports of pain, was limited by fatigue.    Personal Factors and Comorbidities Age;Fitness;Behavior Pattern;Past/Current Experience;Comorbidity 3+;Time since onset of injury/illness/exacerbation    Comorbidities anxiety, DM, HTN    Examination-Participation Restrictions Cleaning;Occupation;Meal  Prep;Church;Community Activity;Shop;Volunteer;[employer]  Work    Stability/Clinical Decision Making Stable/Uncomplicated    Clinical Decision Making Low    Rehab Potential Good    PT Frequency 3x / week    PT Duration 6 weeks    PT Treatment/Interventions ADLs/Self Care Home Management;Aquatic Therapy;Cryotherapy;Electrical Stimulation;Iontophoresis 4mg /ml Dexamethasone;Moist Heat;Traction;Ultrasound;DME Instruction;Gait training;Stair training;Functional mobility training;Therapeutic activities;Therapeutic exercise;Balance training;Neuromuscular re-education;Patient/family education;Manual techniques;Manual lymph drainage;Compression bandaging;Dry needling;Energy conservation;Spinal Manipulations;Joint Manipulations;Splinting;Taping;Passive range of motion    PT Next Visit Plan quad strength, knee extension, weight shifts, gait, balance and knee flexion    PT Home Exercise Plan HH HEP, hamstring  stretch, HR, mini squat at counter    Consulted and Agree with Plan of Care Patient           Patient will benefit from skilled therapeutic intervention in order to improve the following deficits and impairments:  Abnormal gait,Decreased endurance,Decreased activity tolerance,Pain,Decreased balance,Impaired flexibility,Improper body mechanics,Decreased strength,Decreased mobility,Difficulty walking,Decreased scar mobility,Decreased skin integrity  Visit Diagnosis: Muscle weakness (generalized)  History of falling  Other abnormalities of gait and mobility     Problem List Patient Active Problem List   Diagnosis Date Noted  . Chronic pain of left knee 05/31/2020  . Wound dehiscence, surgical   . Status post total left knee replacement 05/28/2020  . Primary osteoarthritis of left knee   . Polyneuropathy due to secondary diabetes mellitus ([place]) 05/25/2019  . Arthritis of right sacroiliac joint 05/25/2019  . Cervical disc disorder 05/25/2019  . Falls 05/25/2019  . General unsteadiness 05/25/2019   . Encounter for well woman exam with routine gynecological exam 01/09/2019  . Screening for colorectal cancer 01/09/2019  . Encounter for screening colonoscopy 12/13/2017  . Hyperparathyroidism, primary ([place]) 12/22/2016  . Vitamin D deficiency 10/31/2015  . Hyperuricemia 05/08/2013  . ONYCHOMYCOSIS, TOENAILS 02/22/2009  . NEVI, MULTIPLE 02/22/2009  . Diabetes mellitus ([place]) 01/25/2008  . BACK PAIN WITH RADICULOPATHY 11/09/2007  . Hyperlipidemia LDL goal <100 08/26/2007  . Morbid obesity ([place]) 08/26/2007  . Essential hypertension 08/26/2007  . Obstructive sleep apnea 04/20/2007   Ihor Austin, LPTA/CLT; CBIS [phone]  Aldona Lento 07/05/2020, 11:10 AM  [place], [name], [address] Phone: [phone]   Fax:  [phone]  Name: LICET DUNPHY MRN: [id number] Date of Birth: 05/02/1955

## 2020-07-05 NOTE — Patient Instructions (Signed)
Hip Extension: 2-4 Inches ° ° ° °Tighten gluteal muscle. Lift one leg 10 times. Restabilize pelvis. Repeat with other leg. Keep pelvis still. Be sure pelvis does not rotate and back does not arch. °Do 2 sets, 10 times per day. ° °http://ss.exer.us/63  ° °Copyright © VHI. All rights reserved.  ° °

## 2020-07-09 ENCOUNTER — Ambulatory Visit (HOSPITAL_COMMUNITY): Payer: Federal, State, Local not specified - PPO

## 2020-07-09 ENCOUNTER — Encounter (HOSPITAL_COMMUNITY): Payer: Self-pay

## 2020-07-09 ENCOUNTER — Other Ambulatory Visit: Payer: Self-pay

## 2020-07-09 DIAGNOSIS — R2689 Other abnormalities of gait and mobility: Secondary | ICD-10-CM | POA: Diagnosis not present

## 2020-07-09 DIAGNOSIS — M6281 Muscle weakness (generalized): Secondary | ICD-10-CM | POA: Diagnosis not present

## 2020-07-09 DIAGNOSIS — Z9181 History of falling: Secondary | ICD-10-CM

## 2020-07-09 NOTE — Therapy (Signed)
[place], [name], [address] Phone: [phone]   Fax:  [phone]  Physical Therapy Treatment  Patient Details  Name: Pamela Stein MRN: [id number] Date of Birth: 13-Sep-1955 Referring Provider (PT): Arther Abbott   Encounter Date: 07/09/2020   PT End of Session - 07/09/20 1008    Visit Number 7    Number of Visits 16    Date for PT Re-Evaluation 08/05/20    Authorization Type BCBS (no auth, VL [id number] used)    Authorization - Visit Number 7    Authorization - Number of Visits 42    Progress Note Due on Visit 10    PT Start Time 1004    PT Stop Time 1042    PT Time Calculation (min) 38 min    Activity Tolerance Patient tolerated treatment well;Patient limited by fatigue    Behavior During Therapy [facility] for tasks assessed/performed           Past Medical History:  Diagnosis Date  . Anxiety   . Back pain   . Bronchitis   . Diabetes mellitus   . Dizziness   . Heart murmur   . History of gout   . Hyperlipidemia   . Hypertension   . Neuropathy   . Obesity   . Obstructive sleep apnea    CPAP    Past Surgical History:  Procedure Laterality Date  . ABDOMINAL HYSTERECTOMY    . ADENOIDECTOMY    . CATARACT EXTRACTION Left    right 02/2018  . COLONOSCOPY  2008   diminutive rectal polyp, s/p removal. polypoid mucosa  . COLONOSCOPY WITH PROPOFOL N/A 01/03/2018   Procedure: COLONOSCOPY WITH PROPOFOL;  Surgeon: Daneil Dolin, MD;  [facility];  Service: Endoscopy;  Laterality: N/A;  12:00pm  . DILATION AND CURETTAGE OF UTERUS    . ENDOMETRIAL ABLATION    . EYE SURGERY Bilateral 12/04/2013   cataract  . PARATHYROIDECTOMY N/A 12/22/2016   Procedure: PARATHYROIDECTOMY, NECK EXPLORATION;  Surgeon: Jackolyn Confer, MD;  Location: [facility];  Service: General;  Laterality: N/A;  . TONSILLECTOMY    . TOTAL KNEE ARTHROPLASTY Left 05/28/2020   Procedure: LEFT TOTAL KNEE ARTHROPLASTY;  Surgeon:  Carole Civil, MD;  Location: [facility];  Service: Orthopedics;  Laterality: Left;  . WOUND EXPLORATION Left 05/31/2020   Procedure: WOUND EXPLORATION AND IRRIGATION LEFT KNEE;  Surgeon: Carole Civil, MD;  Location: [facility];  Service: Orthopedics;  Laterality: Left;  irrigation wound exploration, wound closure    There were no vitals filed for this visit.   Subjective Assessment - 07/09/20 1007    Subjective Knee feels pretty good today, no reports of pain.  Does continue to feel weak.    Pertinent History HTN, DB    Patient Stated Goals to be able to get her left leg stronger so she doesn't have to use the walker.    Currently in Pain? No/denies                             [facility] Adult PT Treatment/Exercise - 07/09/20 0001      Knee/Hip Exercises: Standing   Heel Raises Both;1 set;20 reps    Terminal Knee Extension Left;2 sets;10 reps    Theraband Level (Terminal Knee Extension) Level 3 (Green)    Terminal Knee Extension Limitations 5 second holds thick blue band    Forward Step Up 5 reps;Hand  Hold: 2;Step Height: 2";10 reps;Step Height: 4";2 sets      Knee/Hip Exercises: Seated   Long Arc Quad AROM;Left;2 sets;10 reps    Sit to Sand 10 reps;without UE support;2 sets      Knee/Hip Exercises: Supine   Quad Sets 2 sets;10 reps    Short Arc Quad Sets Left;10 reps;2 sets    Bridges 2 sets;20 reps    Bridges Limitations GTB around thigh    Straight Leg Raises 10 reps;2 sets    Straight Leg Raises Limitations with quad set; extension lag 20 degree flexion                    PT Short Term Goals - 06/24/20 1031      PT SHORT TERM GOAL #1   Title Patient will report at least 50% improvement in overall symptoms and/or function to demonstrate improved functional mobility    Time 3    Period Weeks    Status New    Target Date 07/15/20      PT SHORT TERM GOAL #2   Title Patient will demonstrate 0-120 degrees of left knee ROM    Time 3    Period  Weeks    Status New    Target Date 07/15/20      PT SHORT TERM GOAL #3   Title Patient will be independent in self management strategies to improve quality of life and functional outcomes.    Time 3    Period Weeks    Status New    Target Date 07/15/20             PT Long Term Goals - 06/24/20 1035      PT LONG TERM GOAL #1   Title Patient will report at least 75% improvement in overall symptoms and/or function to demonstrate improved functional mobility    Time 6    Period Weeks    Status New    Target Date 08/05/20      PT LONG TERM GOAL #2   Title Patient will improve on FOTO score to meet predicted outcomes to demonstrate improved functional mobility.    Time 6    Period Weeks    Status New    Target Date 08/05/20      PT LONG TERM GOAL #3   Title Patient will be able to ambulate at least 350 feet in 2MWT with least restrictive assistive device in order to demonstrate improved gait speed for community ambulation.    Time 6    Period Weeks    Status New    Target Date 08/05/20                 Plan - 07/09/20 1020    Clinical Impression Statement Session focus primarly wiht quad strengthening.  Pt improving activation with minimal compensation today through does continue to demonstrate extreme quadwekness distal quad.  Did progress to step up training for functional strengthening wiht some cueing for proper mechanics.    Personal Factors and Comorbidities Age;Fitness;Behavior Pattern;Past/Current Experience;Comorbidity 3+;Time since onset of injury/illness/exacerbation    Comorbidities anxiety, DM, HTN    Examination-Activity Limitations Locomotion Level;Transfers;Squat;Stairs;Stand;Lift;Bend    Examination-Participation Restrictions Cleaning;Occupation;Meal Prep;Church;Community Activity;Shop;Volunteer;Yard Work    Stability/Clinical Decision Making Stable/Uncomplicated    Designer, jewellery Low    Rehab Potential Good    PT Frequency 3x / week    PT  Duration 6 weeks    PT Treatment/Interventions ADLs/Self Care Home Management;Aquatic Therapy;Cryotherapy;Electrical Stimulation;Iontophoresis  4mg /ml Dexamethasone;Moist Heat;Traction;Ultrasound;DME Instruction;Gait training;Stair training;Functional mobility training;Therapeutic activities;Therapeutic exercise;Balance training;Neuromuscular re-education;Patient/family education;Manual techniques;Manual lymph drainage;Compression bandaging;Dry needling;Energy conservation;Spinal Manipulations;Joint Manipulations;Splinting;Taping;Passive range of motion    PT Next Visit Plan quad strength, knee extension, weight shifts, gait, balance and knee flexion    PT Home Exercise Plan HH HEP, hamstring  stretch, HR, mini squat at counter    Consulted and Agree with Plan of Care Patient           Patient will benefit from skilled therapeutic intervention in order to improve the following deficits and impairments:  Abnormal gait,Decreased endurance,Decreased activity tolerance,Pain,Decreased balance,Impaired flexibility,Improper body mechanics,Decreased strength,Decreased mobility,Difficulty walking,Decreased scar mobility,Decreased skin integrity  Visit Diagnosis: Muscle weakness (generalized)  History of falling  Other abnormalities of gait and mobility     Problem List Patient Active Problem List   Diagnosis Date Noted  . Chronic pain of left knee 05/31/2020  . Wound dehiscence, surgical   . Status post total left knee replacement 05/28/2020  . Primary osteoarthritis of left knee   . Polyneuropathy due to secondary diabetes mellitus ([place]) 05/25/2019  . Arthritis of right sacroiliac joint 05/25/2019  . Cervical disc disorder 05/25/2019  . Falls 05/25/2019  . General unsteadiness 05/25/2019  . Encounter for well woman exam with routine gynecological exam 01/09/2019  . Screening for colorectal cancer 01/09/2019  . Encounter for screening colonoscopy 12/13/2017  . Hyperparathyroidism, primary  ([place]) 12/22/2016  . Vitamin D deficiency 10/31/2015  . Hyperuricemia 05/08/2013  . ONYCHOMYCOSIS, TOENAILS 02/22/2009  . NEVI, MULTIPLE 02/22/2009  . Diabetes mellitus ([place]) 01/25/2008  . BACK PAIN WITH RADICULOPATHY 11/09/2007  . Hyperlipidemia LDL goal <100 08/26/2007  . Morbid obesity ([place]) 08/26/2007  . Essential hypertension 08/26/2007  . Obstructive sleep apnea 04/20/2007   Ihor Austin, LPTA/CLT; CBIS [phone]  Aldona Lento 07/09/2020, 1:08 PM  [place], [name], [address] Phone: [phone]   Fax:  [phone]  Name: Pamela Stein MRN: [id number] Date of Birth: 22-Oct-1955

## 2020-07-11 ENCOUNTER — Encounter (HOSPITAL_COMMUNITY): Payer: Self-pay

## 2020-07-11 ENCOUNTER — Other Ambulatory Visit: Payer: Self-pay

## 2020-07-11 ENCOUNTER — Ambulatory Visit (HOSPITAL_COMMUNITY): Payer: Federal, State, Local not specified - PPO | Attending: Orthopedic Surgery

## 2020-07-11 DIAGNOSIS — R2689 Other abnormalities of gait and mobility: Secondary | ICD-10-CM | POA: Diagnosis not present

## 2020-07-11 DIAGNOSIS — Z9181 History of falling: Secondary | ICD-10-CM

## 2020-07-11 DIAGNOSIS — M6281 Muscle weakness (generalized): Secondary | ICD-10-CM

## 2020-07-11 NOTE — Patient Instructions (Addendum)
Quad Sets    Squeeze pelvic floor and hold. Tighten top of left thigh. Hold for 10 seconds.  Repeat 10 times. Do 10 times a day. Repeat with other leg.  Copyright  VHI. All rights reserved.   Short Arc Knee Extension    Place bolster under left knee so it is bent slightly.  Straighten knee. Hold for 5 seconds. Repeat 10 times. Do 3 times a day. Repeat with other leg.  Copyright  VHI. All rights reserved.   Straight Leg Raise    Tighten quad set and slowly raise locked right leg ____ inches from floor. Repeat 10 times per set. Do 2 sets per day.  http://orth.exer.us/1103   Copyright  VHI. All rights reserved.   Hip Extension: 2-4 Inches    Tighten gluteal muscle. Lift one leg 10 times. Restabilize pelvis. Repeat with other leg. Keep pelvis still. Be sure pelvis does not rotate and back does not arch. Do 2 sets, 10 times per day.  http://ss.exer.us/63   Copyright  VHI. All rights reserved.   Abduction: Side Leg Lift (Eccentric) - Side-Lying    Lie on side. Lift top leg slightly higher than shoulder level. Keep top leg straight with body, toes pointing forward. Slowly lower for 3-5 seconds. 10 reps per set, 2 sets per day, 7 days per week.  http://ecce.exer.us/63   Copyright  VHI. All rights reserved.   Bridge    Lie back, legs bent. Inhale, pressing hips up. Keeping ribs in, lengthen lower back. Exhale, rolling down along spine from top. Repeat 10 times. Do 3 sessions per day.  http://pm.exer.us/55   Copyright  VHI. All rights reserved.

## 2020-07-11 NOTE — Therapy (Signed)
[place], [name], [address] Phone: [phone]   Fax:  [phone]  Physical Therapy Treatment  Patient Details  Name: Pamela Stein MRN: [id number] Date of Birth: [birth year] Referring Provider (PT): Arther Abbott   Encounter Date: 07/11/2020   PT End of Session - 07/11/20 1014    Visit Number 8    Number of Visits 16    Date for PT Re-Evaluation 08/05/20    Authorization Type BCBS (no auth, [id number] used)    Authorization - Visit Number 8    Authorization - Number of Visits 42    Progress Note Due on Visit 10    PT Start Time 1005    PT Stop Time 1048    PT Time Calculation (min) 43 min    Activity Tolerance Patient tolerated treatment well;Patient limited by fatigue;No increased pain    Behavior During Therapy WFL for tasks assessed/performed           Past Medical History:  Diagnosis Date  . Anxiety   . Back pain   . Bronchitis   . Diabetes mellitus   . Dizziness   . Heart murmur   . History of gout   . Hyperlipidemia   . Hypertension   . Neuropathy   . Obesity   . Obstructive sleep apnea    CPAP    Past Surgical History:  Procedure Laterality Date  . ABDOMINAL HYSTERECTOMY    . ADENOIDECTOMY    . CATARACT EXTRACTION Left    right 02/2018  . COLONOSCOPY  2008   diminutive rectal polyp, s/p removal. polypoid mucosa  . COLONOSCOPY WITH PROPOFOL N/A 01/03/2018   Procedure: COLONOSCOPY WITH PROPOFOL;  Surgeon: Daneil Dolin, MD;  [facility];  Service: Endoscopy;  Laterality: N/A;  12:00pm  . DILATION AND CURETTAGE OF UTERUS    . ENDOMETRIAL ABLATION    . EYE SURGERY Bilateral 12/04/2013   cataract  . PARATHYROIDECTOMY N/A 12/22/2016   Procedure: PARATHYROIDECTOMY, NECK EXPLORATION;  Surgeon: Jackolyn Confer, MD;  Location: [facility];  Service: General;  Laterality: N/A;  . TONSILLECTOMY    . TOTAL KNEE ARTHROPLASTY Left 05/28/2020   Procedure: LEFT TOTAL KNEE  ARTHROPLASTY;  Surgeon: Carole Civil, MD;  Location: [facility];  Service: Orthopedics;  Laterality: Left;  . WOUND EXPLORATION Left 05/31/2020   Procedure: WOUND EXPLORATION AND IRRIGATION LEFT KNEE;  Surgeon: Carole Civil, MD;  Location: [facility];  Service: Orthopedics;  Laterality: Left;  irrigation wound exploration, wound closure    There were no vitals filed for this visit.   Subjective Assessment - 07/11/20 1013    Subjective Pt stated knee is feeling good today.    Pertinent History HTN, DB    Patient Stated Goals to be able to get her left leg stronger so she doesn't have to use the walker.    Currently in Pain? No/denies                             [facility] Adult PT Treatment/Exercise - 07/11/20 0001      Knee/Hip Exercises: Standing   Heel Raises Both;1 set;20 reps    Forward Step Up 10 reps;Hand Hold: 2;Step Height: 4"    Forward Step Up Limitations cueingfor mechanics to reduce compensation      Knee/Hip Exercises: Supine   Quad Sets 2 sets;10 reps    Short Arc Quad Sets Left;10  reps;2 sets    Bridges 3 sets;10 reps    Bridges Limitations GTB around thigh    Straight Leg Raises 10 reps;2 sets    Straight Leg Raises Limitations with quad set; extension lag 20 degree flexion    Straight Leg Raise with External Rotation 10 reps    Straight Leg Raise with External Rotation Limitations cueing for quad set prior raise      Knee/Hip Exercises: Prone   Hip Extension Both;10 reps                    PT Short Term Goals - 06/24/20 1031      PT SHORT TERM GOAL #1   Title Patient will report at least 50% improvement in overall symptoms and/or function to demonstrate improved functional mobility    Time 3    Period Weeks    Status New    Target Date 07/15/20      PT SHORT TERM GOAL #2   Title Patient will demonstrate 0-120 degrees of left knee ROM    Time 3    Period Weeks    Status New    Target Date 07/15/20      PT SHORT TERM GOAL  #3   Title Patient will be independent in self management strategies to improve quality of life and functional outcomes.    Time 3    Period Weeks    Status New    Target Date 07/15/20             PT Long Term Goals - 06/24/20 1035      PT LONG TERM GOAL #1   Title Patient will report at least 75% improvement in overall symptoms and/or function to demonstrate improved functional mobility    Time 6    Period Weeks    Status New    Target Date 08/05/20      PT LONG TERM GOAL #2   Title Patient will improve on FOTO score to meet predicted outcomes to demonstrate improved functional mobility.    Time 6    Period Weeks    Status New    Target Date 08/05/20      PT LONG TERM GOAL #3   Title Patient will be able to ambulate at least 350 feet in 2MWT with least restrictive assistive device in order to demonstrate improved gait speed for community ambulation.    Time 6    Period Weeks    Status New    Target Date 08/05/20                 Plan - 07/11/20 1019    Clinical Impression Statement Difficulty combining quad set and keeping contraction during straight leg raise, increased cueing required to reduce extension lag during SLR.  Added SLR with ER for VMO strengthening.  Reviewed current HEP and emphasized need for increased quad strengthening, new handout given.    Personal Factors and Comorbidities Age;Fitness;Behavior Pattern;Past/Current Experience;Comorbidity 3+;Time since onset of injury/illness/exacerbation    Comorbidities anxiety, DM, HTN    Examination-Activity Limitations Locomotion Level;Transfers;Squat;Stairs;Stand;Lift;Bend    Examination-Participation Restrictions Cleaning;Occupation;Meal Prep;Church;Community Activity;Shop;Volunteer;Yard Work    Stability/Clinical Decision Making Stable/Uncomplicated    Designer, jewellery Low    Rehab Potential Good    PT Frequency 3x / week    PT Duration 6 weeks    PT Treatment/Interventions ADLs/Self Care Home  Management;Aquatic Therapy;Cryotherapy;Electrical Stimulation;Iontophoresis 4mg /ml Dexamethasone;Moist Heat;Traction;Ultrasound;DME Instruction;Gait training;Stair training;Functional mobility training;Therapeutic activities;Therapeutic exercise;Balance training;Neuromuscular re-education;Patient/family education;Manual techniques;Manual  lymph drainage;Compression bandaging;Dry needling;Energy conservation;Spinal Manipulations;Joint Manipulations;Splinting;Taping;Passive range of motion    PT Next Visit Plan quad strength, knee extension, weight shifts, gait, balance and knee flexion    PT Home Exercise Plan HH HEP, hamstring  stretch, HR, mini squat at counter; 6/2: Quad set, SAQ, SLR all direction, bridge every day    Consulted and Agree with Plan of Care Patient           Patient will benefit from skilled therapeutic intervention in order to improve the following deficits and impairments:  Abnormal gait,Decreased endurance,Decreased activity tolerance,Pain,Decreased balance,Impaired flexibility,Improper body mechanics,Decreased strength,Decreased mobility,Difficulty walking,Decreased scar mobility,Decreased skin integrity  Visit Diagnosis: Muscle weakness (generalized)  History of falling  Other abnormalities of gait and mobility     Problem List Patient Active Problem List   Diagnosis Date Noted  . Chronic pain of left knee 05/31/2020  . Wound dehiscence, surgical   . Status post total left knee replacement 05/28/2020  . Primary osteoarthritis of left knee   . Polyneuropathy due to secondary diabetes mellitus ([place]) 05/25/2019  . Arthritis of right sacroiliac joint 05/25/2019  . Cervical disc disorder 05/25/2019  . Falls 05/25/2019  . General unsteadiness 05/25/2019  . Encounter for well woman exam with routine gynecological exam 01/09/2019  . Screening for colorectal cancer 01/09/2019  . Encounter for screening colonoscopy 12/13/2017  . Hyperparathyroidism, primary ([place])  12/22/2016  . Vitamin D deficiency 10/31/2015  . Hyperuricemia 05/08/2013  . ONYCHOMYCOSIS, TOENAILS 02/22/2009  . NEVI, MULTIPLE 02/22/2009  . Diabetes mellitus ([place]) 01/25/2008  . BACK PAIN WITH RADICULOPATHY 11/09/2007  . Hyperlipidemia LDL goal <100 08/26/2007  . Morbid obesity ([place]) 08/26/2007  . Essential hypertension 08/26/2007  . Obstructive sleep apnea 04/20/2007   Ihor Austin, LPTA/CLT; CBIS [phone]  Aldona Lento 07/11/2020, 1:53 PM  [place], [name], [address] Phone: [phone]   Fax:  [phone]  Name: Pamela Stein MRN: [id number] Date of Birth: December 06, 1955

## 2020-07-12 ENCOUNTER — Ambulatory Visit (HOSPITAL_COMMUNITY): Payer: Federal, State, Local not specified - PPO

## 2020-07-12 ENCOUNTER — Encounter (HOSPITAL_COMMUNITY): Payer: Self-pay

## 2020-07-12 DIAGNOSIS — R2689 Other abnormalities of gait and mobility: Secondary | ICD-10-CM | POA: Diagnosis not present

## 2020-07-12 DIAGNOSIS — Z9181 History of falling: Secondary | ICD-10-CM

## 2020-07-12 DIAGNOSIS — M6281 Muscle weakness (generalized): Secondary | ICD-10-CM

## 2020-07-12 NOTE — Therapy (Signed)
[place], [name], [address] Phone: [phone]   Fax:  [phone]  Physical Therapy Treatment  Patient Details  Name: Pamela Stein MRN: [id number] Date of Birth: 07-24-1955 Referring Provider (PT): Arther Abbott   Encounter Date: 07/12/2020   PT End of Session - 07/12/20 1013    Visit Number 9    Number of Visits 16    Date for PT Re-Evaluation 08/05/20    Authorization Type BCBS (no auth, VL [id number] used)    Authorization - Visit Number 9    Authorization - Number of Visits 42    Progress Note Due on Visit 10    PT Start Time 1008   late arrival   PT Stop Time 1046    PT Time Calculation (min) 38 min    Activity Tolerance Patient tolerated treatment well;Patient limited by fatigue;No increased pain    Behavior During Therapy WFL for tasks assessed/performed           Past Medical History:  Diagnosis Date  . Anxiety   . Back pain   . Bronchitis   . Diabetes mellitus   . Dizziness   . Heart murmur   . History of gout   . Hyperlipidemia   . Hypertension   . Neuropathy   . Obesity   . Obstructive sleep apnea    CPAP    Past Surgical History:  Procedure Laterality Date  . ABDOMINAL HYSTERECTOMY    . ADENOIDECTOMY    . CATARACT EXTRACTION Left    right 02/2018  . COLONOSCOPY  2008   diminutive rectal polyp, s/p removal. polypoid mucosa  . COLONOSCOPY WITH PROPOFOL N/A 01/03/2018   Procedure: COLONOSCOPY WITH PROPOFOL;  Surgeon: Daneil Dolin, MD;  [facility];  Service: Endoscopy;  Laterality: N/A;  12:00pm  . DILATION AND CURETTAGE OF UTERUS    . ENDOMETRIAL ABLATION    . EYE SURGERY Bilateral 12/04/2013   cataract  . PARATHYROIDECTOMY N/A 12/22/2016   Procedure: PARATHYROIDECTOMY, NECK EXPLORATION;  Surgeon: Jackolyn Confer, MD;  Location: [facility];  Service: General;  Laterality: N/A;  . TONSILLECTOMY    . TOTAL KNEE ARTHROPLASTY Left 05/28/2020   Procedure: LEFT TOTAL  KNEE ARTHROPLASTY;  Surgeon: Carole Civil, MD;  Location: [facility];  Service: Orthopedics;  Laterality: Left;  . WOUND EXPLORATION Left 05/31/2020   Procedure: WOUND EXPLORATION AND IRRIGATION LEFT KNEE;  Surgeon: Carole Civil, MD;  Location: [facility];  Service: Orthopedics;  Laterality: Left;  irrigation wound exploration, wound closure    There were no vitals filed for this visit.   Subjective Assessment - 07/12/20 1012    Subjective Can tell she had a good workout last session, no reports of pain currently.    Pertinent History HTN, DB    Patient Stated Goals to be able to get her left leg stronger so she doesn't have to use the walker.    Currently in Pain? No/denies                             [facility] Adult PT Treatment/Exercise - 07/12/20 0001      Knee/Hip Exercises: Machines for Strengthening   Other Machine [employer] TKE 20# 10x 10" holds; Retro/forward gait with band behind knee (toe to heel with TKE while moving other LE)      Knee/Hip Exercises: Standing   Heel Raises Both;1 set;20 reps  Terminal Knee Extension Left;2 sets;10 reps    Terminal Knee Extension Limitations Bodycraft 2 Pl 10x 10"      Knee/Hip Exercises: Supine   Quad Sets 2 sets;10 reps    Quad Sets Limitations 10" NMR with verbal and tactile cueing    Short Arc Quad Sets Left;10 reps;2 sets    Bridges 3 sets;10 reps    Bridges Limitations ball squeeze    Straight Leg Raises 10 reps;2 sets    Straight Leg Raises Limitations with quad set; extension lag 20 degree flexion                    PT Short Term Goals - 06/24/20 1031      PT SHORT TERM GOAL #1   Title Patient will report at least 50% improvement in overall symptoms and/or function to demonstrate improved functional mobility    Time 3    Period Weeks    Status New    Target Date 07/15/20      PT SHORT TERM GOAL #2   Title Patient will demonstrate 0-120 degrees of left knee ROM    Time 3    Period Weeks     Status New    Target Date 07/15/20      PT SHORT TERM GOAL #3   Title Patient will be independent in self management strategies to improve quality of life and functional outcomes.    Time 3    Period Weeks    Status New    Target Date 07/15/20             PT Long Term Goals - 06/24/20 1035      PT LONG TERM GOAL #1   Title Patient will report at least 75% improvement in overall symptoms and/or function to demonstrate improved functional mobility    Time 6    Period Weeks    Status New    Target Date 08/05/20      PT LONG TERM GOAL #2   Title Patient will improve on FOTO score to meet predicted outcomes to demonstrate improved functional mobility.    Time 6    Period Weeks    Status New    Target Date 08/05/20      PT LONG TERM GOAL #3   Title Patient will be able to ambulate at least 350 feet in 2MWT with least restrictive assistive device in order to demonstrate improved gait speed for community ambulation.    Time 6    Period Weeks    Status New    Target Date 08/05/20                 Plan - 07/12/20 1221    Clinical Impression Statement Pt continues to require NMR for quad activation as tendency to compensate.  Added TKE associated with gait training to improve functional mechanics, required HHA to complete.    Personal Factors and Comorbidities Age;Fitness;Behavior Pattern;Past/Current Experience;Comorbidity 3+;Time since onset of injury/illness/exacerbation    Comorbidities anxiety, DM, HTN    Examination-Activity Limitations Locomotion Level;Transfers;Squat;Stairs;Stand;Lift;Bend    Examination-Participation Restrictions Cleaning;Occupation;Meal Prep;Church;Community Activity;Shop;Volunteer;Yard Work    Stability/Clinical Decision Making Stable/Uncomplicated    Designer, jewellery Low    Rehab Potential Good    PT Frequency 3x / week    PT Duration 6 weeks    PT Treatment/Interventions ADLs/Self Care Home Management;Aquatic  Therapy;Cryotherapy;Electrical Stimulation;Iontophoresis 4mg /ml Dexamethasone;Moist Heat;Traction;Ultrasound;DME Instruction;Gait training;Stair training;Functional mobility training;Therapeutic activities;Therapeutic exercise;Balance training;Neuromuscular re-education;Patient/family education;Manual techniques;Manual lymph drainage;Compression bandaging;Dry  needling;Energy conservation;Spinal Manipulations;Joint Manipulations;Splinting;Taping;Passive range of motion    PT Next Visit Plan 10th visit progress next session.  quad strength, knee extension, weight shifts, gait, balance and knee flexion    PT Home Exercise Plan HH HEP, hamstring  stretch, HR, mini squat at counter; 6/2: Quad set, SAQ, SLR all direction, bridge every day    Consulted and Agree with Plan of Care Patient           Patient will benefit from skilled therapeutic intervention in order to improve the following deficits and impairments:  Abnormal gait,Decreased endurance,Decreased activity tolerance,Pain,Decreased balance,Impaired flexibility,Improper body mechanics,Decreased strength,Decreased mobility,Difficulty walking,Decreased scar mobility,Decreased skin integrity  Visit Diagnosis: Muscle weakness (generalized)  History of falling  Other abnormalities of gait and mobility     Problem List Patient Active Problem List   Diagnosis Date Noted  . Chronic pain of left knee 05/31/2020  . Wound dehiscence, surgical   . Status post total left knee replacement 05/28/2020  . Primary osteoarthritis of left knee   . Polyneuropathy due to secondary diabetes mellitus ([place]) 05/25/2019  . Arthritis of right sacroiliac joint 05/25/2019  . Cervical disc disorder 05/25/2019  . Falls 05/25/2019  . General unsteadiness 05/25/2019  . Encounter for well woman exam with routine gynecological exam 01/09/2019  . Screening for colorectal cancer 01/09/2019  . Encounter for screening colonoscopy 12/13/2017  . Hyperparathyroidism,  primary ([place]) 12/22/2016  . Vitamin D deficiency 10/31/2015  . Hyperuricemia 05/08/2013  . ONYCHOMYCOSIS, TOENAILS 02/22/2009  . NEVI, MULTIPLE 02/22/2009  . Diabetes mellitus ([place]) 01/25/2008  . BACK PAIN WITH RADICULOPATHY 11/09/2007  . Hyperlipidemia LDL goal <100 08/26/2007  . Morbid obesity ([place]) 08/26/2007  . Essential hypertension 08/26/2007  . Obstructive sleep apnea 04/20/2007   Ihor Austin, LPTA/CLT; CBIS [phone]  Aldona Lento 07/12/2020, 12:25 PM  [place] [address] [place], [name], [address] Phone: [phone]   Fax:  [phone]  Name: Pamela Stein MRN: [id number] Date of Birth: [birth year]

## 2020-07-15 ENCOUNTER — Encounter (HOSPITAL_COMMUNITY): Payer: Self-pay | Admitting: Physical Therapy

## 2020-07-15 ENCOUNTER — Ambulatory Visit (HOSPITAL_COMMUNITY): Payer: Federal, State, Local not specified - PPO | Admitting: Physical Therapy

## 2020-07-15 ENCOUNTER — Other Ambulatory Visit: Payer: Self-pay

## 2020-07-15 DIAGNOSIS — M6281 Muscle weakness (generalized): Secondary | ICD-10-CM

## 2020-07-15 DIAGNOSIS — Z9181 History of falling: Secondary | ICD-10-CM

## 2020-07-15 DIAGNOSIS — R2689 Other abnormalities of gait and mobility: Secondary | ICD-10-CM

## 2020-07-15 NOTE — Therapy (Signed)
[facility] [address] [place], [name], [address] Phone: [phone]   Fax:  [phone]  Physical Therapy Treatment and Progress Note  Patient Details  Name: Pamela Stein MRN: [id number] Date of Birth: 07/08/1955   LEFT KNEE ROM 4-122   Referring Provider (PT): Arther Abbott  Progress Note Reporting Period 06/24/20 to 07/15/20  See note below for Objective Data and Assessment of Progress/Goals.      Encounter Date: 07/15/2020   PT End of Session - 07/15/20 1005    Visit Number 10    Number of Visits 16    Date for PT Re-Evaluation 08/05/20    Authorization Type BCBS (no auth, [id number])    Authorization - Visit Number 10    Authorization - Number of Visits 42    Progress Note Due on Visit 20    PT Start Time 1005    PT Stop Time 1047    PT Time Calculation (min) 42 min    Activity Tolerance Patient tolerated treatment well;Patient limited by fatigue;No increased pain    Behavior During Therapy WFL for tasks assessed/performed           Past Medical History:  Diagnosis Date  . Anxiety   . Back pain   . Bronchitis   . Diabetes mellitus   . Dizziness   . Heart murmur   . History of gout   . Hyperlipidemia   . Hypertension   . Neuropathy   . Obesity   . Obstructive sleep apnea    CPAP    Past Surgical History:  Procedure Laterality Date  . ABDOMINAL HYSTERECTOMY    . ADENOIDECTOMY    . CATARACT EXTRACTION Left    right 02/2018  . COLONOSCOPY  2008   diminutive rectal polyp, s/p removal. polypoid mucosa  . COLONOSCOPY WITH PROPOFOL N/A 01/03/2018   Procedure: COLONOSCOPY WITH PROPOFOL;  Surgeon: Daneil Dolin, MD;  [facility];  Service: Endoscopy;  Laterality: N/A;  12:00pm  . DILATION AND CURETTAGE OF UTERUS    . ENDOMETRIAL ABLATION    . EYE SURGERY Bilateral 12/04/2013   cataract  . PARATHYROIDECTOMY N/A 12/22/2016   Procedure: PARATHYROIDECTOMY, NECK EXPLORATION;  Surgeon:  Jackolyn Confer, MD;  Location: [facility];  Service: General;  Laterality: N/A;  . TONSILLECTOMY    . TOTAL KNEE ARTHROPLASTY Left 05/28/2020   Procedure: LEFT TOTAL KNEE ARTHROPLASTY;  Surgeon: Carole Civil, MD;  Location: [facility];  Service: Orthopedics;  Laterality: Left;  . WOUND EXPLORATION Left 05/31/2020   Procedure: WOUND EXPLORATION AND IRRIGATION LEFT KNEE;  Surgeon: Carole Civil, MD;  Location: [facility];  Service: Orthopedics;  Laterality: Left;  irrigation wound exploration, wound closure    There were no vitals filed for this visit.   Subjective Assessment - 07/15/20 1010    Subjective No current pain. States that she has been doing her exercises but it takes all day to do them. Reports overall she feels at least 75% better but still has to use walker for everything and feels leg is still very weak.    Pertinent History HTN, DB    Patient Stated Goals to be able to get her left leg stronger so she doesn't have to use the walker.    Currently in Pain? No/denies              [facility] PT Assessment - 07/15/20 0001      Assessment   Medical Diagnosis  L TKA    Referring Provider (PT) Arther Abbott    Onset Date/Surgical Date 05/28/20    Next MD Visit 6/76/22      AROM   Right/Left Knee Left    Left Knee Extension 4   lacking   Left Knee Flexion 122      Strength   Overall Strength --   unable to perform a SLR with < 5 degrees of extension lag on the left leg                        [facility] Adult PT Treatment/Exercise - 07/15/20 0001      Knee/Hip Exercises: Supine   Short Arc Quad Sets 2 sets;5 reps;Both   5" holds with ball and cues to keep feet up right to reduce adductors to lift leg   Other Supine Knee/Hip Exercises SLR - with Strap - to keep it straight 5" holds 4x5 Left   heavily compensates with adductors     Knee/Hip Exercises: Prone   Other Prone Exercises prone quad set - verbal and tactile cues very diffcult to perfrorm - continues to  compensate with inner and outer thigh muscles                  PT Education - 07/15/20 1051    Education Details reassured pateitn on current presentation, on quad strength and plan moving forward    Person(s) Educated Patient    Methods Explanation    Comprehension Verbalized understanding            PT Short Term Goals - 07/15/20 1010      PT SHORT TERM GOAL #1   Title Patient will report at least 50% improvement in overall symptoms and/or function to demonstrate improved functional mobility    Time 3    Period Weeks    Status Achieved    Target Date 07/15/20      PT SHORT TERM GOAL #2   Title Patient will demonstrate 0-120 degrees of left knee ROM    Time 3    Period Weeks    Status On-going    Target Date 07/15/20      PT SHORT TERM GOAL #3   Title Patient will be independent in self management strategies to improve quality of life and functional outcomes.    Time 3    Period Weeks    Status On-going    Target Date 07/15/20             PT Long Term Goals - 07/15/20 1010      PT LONG TERM GOAL #1   Title Patient will report at least 75% improvement in overall symptoms and/or function to demonstrate improved functional mobility    Time 6    Period Weeks    Status Achieved      PT LONG TERM GOAL #2   Title Patient will improve on FOTO score to meet predicted outcomes to demonstrate improved functional mobility.    Time 6    Period Weeks    Status On-going      PT LONG TERM GOAL #3   Title Patient will be able to ambulate at least 350 feet in 2MWT with least restrictive assistive device in order to demonstrate improved gait speed for community ambulation.    Time 6    Period Weeks    Status On-going  Plan - 07/15/20 1047    Clinical Impression Statement Overall Patient's ROM is very good but quad strength is still lacking with  heavy compensation of inner and outer thigh muscles to achieve knee extension  and straight leg  lift. Will consider neuro stim again in future sessions and focus on tactile cues to help with quad activation.  Will continue to focus on quad activation as tolerated.    Personal Factors and Comorbidities Age;Fitness;Behavior Pattern;Past/Current Experience;Comorbidity 3+;Time since onset of injury/illness/exacerbation    Comorbidities anxiety, DM, HTN    Examination-Activity Limitations Locomotion Level;Transfers;Squat;Stairs;Stand;Lift;Bend    Examination-Participation Restrictions Cleaning;Occupation;Meal Prep;Church;Community Activity;Shop;Volunteer;Yard Work    Stability/Clinical Decision Making Stable/Uncomplicated    Rehab Potential Good    PT Frequency 3x / week    PT Duration 6 weeks    PT Treatment/Interventions ADLs/Self Care Home Management;Aquatic Therapy;Cryotherapy;Electrical Stimulation;Iontophoresis 4mg /ml Dexamethasone;Moist Heat;Traction;Ultrasound;DME Instruction;Gait training;Stair training;Functional mobility training;Therapeutic activities;Therapeutic exercise;Balance training;Neuromuscular re-education;Patient/family education;Manual techniques;Manual lymph drainage;Compression bandaging;Dry needling;Energy conservation;Spinal Manipulations;Joint Manipulations;Splinting;Taping;Passive range of motion    PT Next Visit Plan focus on quad strength (watch for adductor compensations), knee extension, weight shifts, gait, balance and knee flexion    PT Home Exercise Plan HH HEP, hamstring  stretch, HR, mini squat at counter; 6/2: Quad set, SAQ, SLR all direction, bridge every day    Consulted and Agree with Plan of Care Patient           Patient will benefit from skilled therapeutic intervention in order to improve the following deficits and impairments:  Abnormal gait,Decreased endurance,Decreased activity tolerance,Pain,Decreased balance,Impaired flexibility,Improper body mechanics,Decreased strength,Decreased mobility,Difficulty walking,Decreased scar mobility,Decreased skin  integrity  Visit Diagnosis: Muscle weakness (generalized)  History of falling  Other abnormalities of gait and mobility     Problem List Patient Active Problem List   Diagnosis Date Noted  . Chronic pain of left knee 05/31/2020  . Wound dehiscence, surgical   . Status post total left knee replacement 05/28/2020  . Primary osteoarthritis of left knee   . Polyneuropathy due to secondary diabetes mellitus ([place]) 05/25/2019  . Arthritis of right sacroiliac joint 05/25/2019  . Cervical disc disorder 05/25/2019  . Falls 05/25/2019  . General unsteadiness 05/25/2019  . Encounter for well woman exam with routine gynecological exam 01/09/2019  . Screening for colorectal cancer 01/09/2019  . Encounter for screening colonoscopy 12/13/2017  . Hyperparathyroidism, primary ([place]) 12/22/2016  . Vitamin D deficiency 10/31/2015  . Hyperuricemia 05/08/2013  . ONYCHOMYCOSIS, TOENAILS 02/22/2009  . NEVI, MULTIPLE 02/22/2009  . Diabetes mellitus ([place]) 01/25/2008  . BACK PAIN WITH RADICULOPATHY 11/09/2007  . Hyperlipidemia LDL goal <100 08/26/2007  . Morbid obesity ([place]) 08/26/2007  . Essential hypertension 08/26/2007  . Obstructive sleep apnea 04/20/2007    10:53 AM, 07/15/20 Jerene Pitch, DPT Physical Therapy with [facility]  [phone] office   [facility] [address] [place], [name], [address] Phone: [phone]   Fax:  [phone]  Name: Pamela Stein MRN: [id number] Date of Birth: 1955/05/12

## 2020-07-17 ENCOUNTER — Other Ambulatory Visit: Payer: Self-pay

## 2020-07-17 ENCOUNTER — Ambulatory Visit (HOSPITAL_COMMUNITY): Payer: Federal, State, Local not specified - PPO | Admitting: Physical Therapy

## 2020-07-17 ENCOUNTER — Encounter (HOSPITAL_COMMUNITY): Payer: Self-pay | Admitting: Physical Therapy

## 2020-07-17 DIAGNOSIS — Z9181 History of falling: Secondary | ICD-10-CM

## 2020-07-17 DIAGNOSIS — M6281 Muscle weakness (generalized): Secondary | ICD-10-CM

## 2020-07-17 DIAGNOSIS — R2689 Other abnormalities of gait and mobility: Secondary | ICD-10-CM

## 2020-07-17 NOTE — Therapy (Signed)
[facility] [place], [name], [address] Phone: [phone]   Fax:  [phone]  Physical Therapy Treatment  Patient Details  Name: Pamela Stein MRN: [id number] Date of Birth: Jun 12, 1955 Referring Provider (PT): Arther Abbott   Encounter Date: 07/17/2020   PT End of Session - 07/17/20 1050    Visit Number 11    Number of Visits 16    Date for PT Re-Evaluation 08/05/20    Authorization Type BCBS (no auth, [id number] used)    Authorization - Visit Number 11    Authorization - Number of Visits 42    Progress Note Due on Visit 20    PT Start Time 1050    PT Stop Time 1128    PT Time Calculation (min) 38 min    Activity Tolerance Patient tolerated treatment well;Patient limited by fatigue;No increased pain    Behavior During Therapy WFL for tasks assessed/performed           Past Medical History:  Diagnosis Date  . Anxiety   . Back pain   . Bronchitis   . Diabetes mellitus   . Dizziness   . Heart murmur   . History of gout   . Hyperlipidemia   . Hypertension   . Neuropathy   . Obesity   . Obstructive sleep apnea    CPAP    Past Surgical History:  Procedure Laterality Date  . ABDOMINAL HYSTERECTOMY    . ADENOIDECTOMY    . CATARACT EXTRACTION Left    right 02/2018  . COLONOSCOPY  2008   diminutive rectal polyp, s/p removal. polypoid mucosa  . COLONOSCOPY WITH PROPOFOL N/A 01/03/2018   Procedure: COLONOSCOPY WITH PROPOFOL;  Surgeon: Daneil Dolin, MD;  [facility];  Service: Endoscopy;  Laterality: N/A;  12:00pm  . DILATION AND CURETTAGE OF UTERUS    . ENDOMETRIAL ABLATION    . EYE SURGERY Bilateral 12/04/2013   cataract  . PARATHYROIDECTOMY N/A 12/22/2016   Procedure: PARATHYROIDECTOMY, NECK EXPLORATION;  Surgeon: Jackolyn Confer, MD;  Location: [facility];  Service: General;  Laterality: N/A;  . TONSILLECTOMY    . TOTAL KNEE ARTHROPLASTY Left 05/28/2020   Procedure: LEFT TOTAL KNEE  ARTHROPLASTY;  Surgeon: Carole Civil, MD;  Location: [facility];  Service: Orthopedics;  Laterality: Left;  . WOUND EXPLORATION Left 05/31/2020   Procedure: WOUND EXPLORATION AND IRRIGATION LEFT KNEE;  Surgeon: Carole Civil, MD;  Location: [facility];  Service: Orthopedics;  Laterality: Left;  irrigation wound exploration, wound closure    There were no vitals filed for this visit.       [facility] PT Assessment - 07/17/20 0001      Assessment   Medical Diagnosis L TKA    Referring Provider (PT) Arther Abbott    Onset Date/Surgical Date 05/28/20    Next MD Visit 08/04/20                         [facility] Adult PT Treatment/Exercise - 07/17/20 0001      Knee/Hip Exercises: Standing   Forward Step Up 15 reps;Hand Hold: 2;Step Height: 2"   for fward step down adn 4" for forward step up - x3 sets   Other Standing Knee Exercises mini knee bends with both knee narrow base of support (tibial translation and then TKE with holds in mini squat x15 5-15" holds    Other Standing Knee Exercises SLS with PT assist on  front and back of knee to prevent knee givving out - 6 minute of SLS practice with soft knee - left                    PT Short Term Goals - 07/15/20 1010      PT SHORT TERM GOAL #1   Title Patient will report at least 50% improvement in overall symptoms and/or function to demonstrate improved functional mobility    Time 3    Period Weeks    Status Achieved    Target Date 07/15/20      PT SHORT TERM GOAL #2   Title Patient will demonstrate 0-120 degrees of left knee ROM    Time 3    Period Weeks    Status On-going    Target Date 07/15/20      PT SHORT TERM GOAL #3   Title Patient will be independent in self management strategies to improve quality of life and functional outcomes.    Time 3    Period Weeks    Status On-going    Target Date 07/15/20             PT Long Term Goals - 07/15/20 1010      PT LONG TERM GOAL #1   Title Patient  will report at least 75% improvement in overall symptoms and/or function to demonstrate improved functional mobility    Time 6    Period Weeks    Status Achieved      PT LONG TERM GOAL #2   Title Patient will improve on FOTO score to meet predicted outcomes to demonstrate improved functional mobility.    Time 6    Period Weeks    Status On-going      PT LONG TERM GOAL #3   Title Patient will be able to ambulate at least 350 feet in 2MWT with least restrictive assistive device in order to demonstrate improved gait speed for community ambulation.    Time 6    Period Weeks    Status On-going                 Plan - 07/17/20 1050    Clinical Impression Statement Patient required constant reassurance during session. Continued to demonstrate weakness in quads. Trialed step downs but patient required guarding at knee to ensure no loss of balance. Patient very frustrated with current presentation but reassured patient. Will continue with closed chain quad strengthening to improve neural input to left leg. Patient reported leg changed from numb to tingling by end of session. Possible lumbar mobilizations next session to see if that is contributing to lack of quad extension.    Personal Factors and Comorbidities Age;Fitness;Behavior Pattern;Past/Current Experience;Comorbidity 3+;Time since onset of injury/illness/exacerbation    Comorbidities anxiety, DM, HTN    Examination-Activity Limitations Locomotion Level;Transfers;Squat;Stairs;Stand;Lift;Bend    Examination-Participation Restrictions Cleaning;Occupation;Meal Prep;Church;Community Activity;Shop;Volunteer;Yard Work    Stability/Clinical Decision Making Stable/Uncomplicated    Rehab Potential Good    PT Frequency 3x / week    PT Duration 6 weeks    PT Treatment/Interventions ADLs/Self Care Home Management;Aquatic Therapy;Cryotherapy;Electrical Stimulation;Iontophoresis 4mg /ml Dexamethasone;Moist Heat;Traction;Ultrasound;DME  Instruction;Gait training;Stair training;Functional mobility training;Therapeutic activities;Therapeutic exercise;Balance training;Neuromuscular re-education;Patient/family education;Manual techniques;Manual lymph drainage;Compression bandaging;Dry needling;Energy conservation;Spinal Manipulations;Joint Manipulations;Splinting;Taping;Passive range of motion    PT Next Visit Plan closed kinetic chain strengtheing, guard knee, trial lumbar mobilizations, focus on quad strength (watch for adductor compensations), knee extension, weight shifts, gait, balance and knee flexion    PT Home Exercise Plan  HH HEP, hamstring  stretch, HR, mini squat at counter; 6/2: Quad set, SAQ, SLR all direction, bridge every day    Consulted and Agree with Plan of Care Patient           Patient will benefit from skilled therapeutic intervention in order to improve the following deficits and impairments:  Abnormal gait,Decreased endurance,Decreased activity tolerance,Pain,Decreased balance,Impaired flexibility,Improper body mechanics,Decreased strength,Decreased mobility,Difficulty walking,Decreased scar mobility,Decreased skin integrity  Visit Diagnosis: Muscle weakness (generalized)  History of falling  Other abnormalities of gait and mobility     Problem List Patient Active Problem List   Diagnosis Date Noted  . Chronic pain of left knee 05/31/2020  . Wound dehiscence, surgical   . Status post total left knee replacement 05/28/2020  . Primary osteoarthritis of left knee   . Polyneuropathy due to secondary diabetes mellitus ([place]) 05/25/2019  . Arthritis of right sacroiliac joint 05/25/2019  . Cervical disc disorder 05/25/2019  . Falls 05/25/2019  . General unsteadiness 05/25/2019  . Encounter for well woman exam with routine gynecological exam 01/09/2019  . Screening for colorectal cancer 01/09/2019  . Encounter for screening colonoscopy 12/13/2017  . Hyperparathyroidism, primary ([place]) 12/22/2016  .  Vitamin D deficiency 10/31/2015  . Hyperuricemia 05/08/2013  . ONYCHOMYCOSIS, TOENAILS 02/22/2009  . NEVI, MULTIPLE 02/22/2009  . Diabetes mellitus ([place]) 01/25/2008  . BACK PAIN WITH RADICULOPATHY 11/09/2007  . Hyperlipidemia LDL goal <100 08/26/2007  . Morbid obesity ([place]) 08/26/2007  . Essential hypertension 08/26/2007  . Obstructive sleep apnea 04/20/2007   1:59 PM, 07/17/20 Jerene Pitch, DPT Physical Therapy with [facility]  [phone] office  [facility] [address] [place], [name], [address] Phone: [phone]   Fax:  [phone]  Name: Pamela Stein MRN: [id number] Date of Birth: 05/26/1955

## 2020-07-19 ENCOUNTER — Ambulatory Visit (HOSPITAL_COMMUNITY): Payer: Federal, State, Local not specified - PPO

## 2020-07-19 ENCOUNTER — Other Ambulatory Visit: Payer: Self-pay

## 2020-07-19 ENCOUNTER — Encounter (HOSPITAL_COMMUNITY): Payer: Self-pay

## 2020-07-19 DIAGNOSIS — Z9181 History of falling: Secondary | ICD-10-CM

## 2020-07-19 DIAGNOSIS — M6281 Muscle weakness (generalized): Secondary | ICD-10-CM | POA: Diagnosis not present

## 2020-07-19 DIAGNOSIS — R2689 Other abnormalities of gait and mobility: Secondary | ICD-10-CM | POA: Diagnosis not present

## 2020-07-19 NOTE — Therapy (Signed)
[place], [name], [address] Phone: [phone]   Fax:  [phone]  Physical Therapy Treatment  Patient Details  Name: Pamela Stein MRN: [id number] Date of Birth: 1955-12-14 Referring Provider (PT): Arther Abbott   Encounter Date: 07/19/2020   PT End of Session - 07/19/20 0948     Visit Number 12    Number of Visits 16    Date for PT Re-Evaluation 08/05/20    Authorization Type BCBS (no auth, [id number])    Authorization - Visit Number 12    Authorization - Number of Visits 42    Progress Note Due on Visit 20    PT Start Time 0945    PT Stop Time 1030    PT Time Calculation (min) 45 min    Activity Tolerance Patient tolerated treatment well;Patient limited by fatigue;No increased pain    Behavior During Therapy WFL for tasks assessed/performed             Past Medical History:  Diagnosis Date   Anxiety    Back pain    Bronchitis    Diabetes mellitus    Dizziness    Heart murmur    History of gout    Hyperlipidemia    Hypertension    Neuropathy    Obesity    Obstructive sleep apnea    CPAP    Past Surgical History:  Procedure Laterality Date   ABDOMINAL HYSTERECTOMY     ADENOIDECTOMY     CATARACT EXTRACTION Left    right 02/2018   COLONOSCOPY  2008   diminutive rectal polyp, s/p removal. polypoid mucosa   COLONOSCOPY WITH PROPOFOL N/A 01/03/2018   Procedure: COLONOSCOPY WITH PROPOFOL;  Surgeon: Daneil Dolin, MD;  [facility];  Service: Endoscopy;  Laterality: N/A;  12:00pm   DILATION AND CURETTAGE OF UTERUS     ENDOMETRIAL ABLATION     EYE SURGERY Bilateral 12/04/2013   cataract   PARATHYROIDECTOMY N/A 12/22/2016   Procedure: PARATHYROIDECTOMY, NECK EXPLORATION;  Surgeon: Jackolyn Confer, MD;  Location: [facility];  Service: General;  Laterality: N/A;   TONSILLECTOMY     TOTAL KNEE ARTHROPLASTY Left 05/28/2020   Procedure: LEFT TOTAL KNEE ARTHROPLASTY;   Surgeon: Carole Civil, MD;  Location: [facility];  Service: Orthopedics;  Laterality: Left;   WOUND EXPLORATION Left 05/31/2020   Procedure: WOUND EXPLORATION AND IRRIGATION LEFT KNEE;  Surgeon: Carole Civil, MD;  Location: [facility];  Service: Orthopedics;  Laterality: Left;  irrigation wound exploration, wound closure    There were no vitals filed for this visit.   Subjective Assessment - 07/19/20 0947     Subjective No pain reported, continued LE weakness requiring use of RW for ambulation    Pertinent History HTN, DB    Patient Stated Goals to be able to get her left leg stronger so she doesn't have to use the walker.    Currently in Pain? No/denies    Pain Score 0-No pain    Pain Location Knee    Pain Orientation Left                [facility] PT Assessment - 07/19/20 0001       Assessment   Medical Diagnosis L TKA    Referring Provider (PT) Arther Abbott    Onset Date/Surgical Date 05/28/20    Next MD Visit 08/04/20  [facility] Adult PT Treatment/Exercise - 07/19/20 0001       Knee/Hip Exercises: Aerobic   Nustep LE only level 8 x 6 min for extension strength      Knee/Hip Exercises: Standing   Heel Raises Both;1 set;20 reps    Hip Flexion Stengthening;Left;3 sets;10 reps    Hip Flexion Limitations 6 lbs    Gait Training retrowalking x 2 min in // bars    Other Standing Knee Exercises sidestepping x2 min    Other Standing Knee Exercises SLS with right foot on 6" step performing lift-offs for LLE stability 4x10 reps      Knee/Hip Exercises: Seated   Long Arc Quad AROM;Left;10 reps;Strengthening;3 sets    Long Arc Quad Weight 3 lbs.    Hamstring Curl Strengthening;Left;3 sets;10 reps    Hamstring Limitations red t-band      Modalities   Modalities --   massage gun, self-performance x 4 min to left quad for stimulation. Pt feels more engagement following activity                   PT Education - 07/19/20  1019     Education Details education on massage gun and ankle weight purchase. Discussion regarding AMI of quadriceps following surgery/injury    Person(s) Educated Patient    Methods Explanation;Demonstration    Comprehension Verbalized understanding              PT Short Term Goals - 07/15/20 1010       PT SHORT TERM GOAL #1   Title Patient will report at least 50% improvement in overall symptoms and/or function to demonstrate improved functional mobility    Time 3    Period Weeks    Status Achieved    Target Date 07/15/20      PT SHORT TERM GOAL #2   Title Patient will demonstrate 0-120 degrees of left knee ROM    Time 3    Period Weeks    Status On-going    Target Date 07/15/20      PT SHORT TERM GOAL #3   Title Patient will be independent in self management strategies to improve quality of life and functional outcomes.    Time 3    Period Weeks    Status On-going    Target Date 07/15/20               PT Long Term Goals - 07/15/20 1010       PT LONG TERM GOAL #1   Title Patient will report at least 75% improvement in overall symptoms and/or function to demonstrate improved functional mobility    Time 6    Period Weeks    Status Achieved      PT LONG TERM GOAL #2   Title Patient will improve on FOTO score to meet predicted outcomes to demonstrate improved functional mobility.    Time 6    Period Weeks    Status On-going      PT LONG TERM GOAL #3   Title Patient will be able to ambulate at least 350 feet in 2MWT with least restrictive assistive device in order to demonstrate improved gait speed for community ambulation.    Time 6    Period Weeks    Status On-going                   Plan - 07/19/20 1028     Clinical Impression Statement Good session today with improve left quad  recruitment appreciated after use of massage gun and exhibiting improved co-contraction with single leg stance whilst performing lift off from step with palpable  quad and glute contraction appreciated with decreased instance of left knee buckling.  Continued PT sessions indicated to improve LLE strength and progress ambulation to reduce need for RW    Personal Factors and Comorbidities Age;Fitness;Behavior Pattern;Past/Current Experience;Comorbidity 3+;Time since onset of injury/illness/exacerbation    Comorbidities anxiety, DM, HTN    Examination-Activity Limitations Locomotion Level;Transfers;Squat;Stairs;Stand;Lift;Bend    Examination-Participation Restrictions Cleaning;Occupation;Meal Prep;Church;Community Activity;Shop;Volunteer;Yard Work    Stability/Clinical Decision Making Stable/Uncomplicated    Rehab Potential Good    PT Frequency 3x / week    PT Duration 6 weeks    PT Treatment/Interventions ADLs/Self Care Home Management;Aquatic Therapy;Cryotherapy;Electrical Stimulation;Iontophoresis 4mg /ml Dexamethasone;Moist Heat;Traction;Ultrasound;DME Instruction;Gait training;Stair training;Functional mobility training;Therapeutic activities;Therapeutic exercise;Balance training;Neuromuscular re-education;Patient/family education;Manual techniques;Manual lymph drainage;Compression bandaging;Dry needling;Energy conservation;Spinal Manipulations;Joint Manipulations;Splinting;Taping;Passive range of motion    PT Next Visit Plan closed kinetic chain strengtheing, guard knee, trial lumbar mobilizations, focus on quad strength (watch for adductor compensations), knee extension, weight shifts, gait, balance and knee flexion    PT Home Exercise Plan HH HEP, hamstring  stretch, HR, mini squat at counter; 6/2: Quad set, SAQ, SLR all direction, bridge every day    Consulted and Agree with Plan of Care Patient             Patient will benefit from skilled therapeutic intervention in order to improve the following deficits and impairments:  Abnormal gait, Decreased endurance, Decreased activity tolerance, Pain, Decreased balance, Impaired flexibility, Improper body  mechanics, Decreased strength, Decreased mobility, Difficulty walking, Decreased scar mobility, Decreased skin integrity  Visit Diagnosis: Muscle weakness (generalized)  History of falling  Other abnormalities of gait and mobility     Problem List Patient Active Problem List   Diagnosis Date Noted   Chronic pain of left knee 05/31/2020   Wound dehiscence, surgical    Status post total left knee replacement 05/28/2020   Primary osteoarthritis of left knee    Polyneuropathy due to secondary diabetes mellitus ([place]) 05/25/2019   Arthritis of right sacroiliac joint 05/25/2019   Cervical disc disorder 05/25/2019   Falls 05/25/2019   General unsteadiness 05/25/2019   Encounter for well woman exam with routine gynecological exam 01/09/2019   Screening for colorectal cancer 01/09/2019   Encounter for screening colonoscopy 12/13/2017   Hyperparathyroidism, primary ([place]) 12/22/2016   Vitamin D deficiency 10/31/2015   Hyperuricemia 05/08/2013   ONYCHOMYCOSIS, TOENAILS 02/22/2009   NEVI, MULTIPLE 02/22/2009   Diabetes mellitus ([place]) 01/25/2008   BACK PAIN WITH RADICULOPATHY 11/09/2007   Hyperlipidemia LDL goal <100 08/26/2007   Morbid obesity ([place]) 08/26/2007   Essential hypertension 08/26/2007   Obstructive sleep apnea 04/20/2007   10:32 AM, 07/19/20 M. Sherlyn Lees, PT, DPT Physical Therapist- [place] Office Number: [phone]   [place] [address] [place], [name], [address] Phone: [phone]   Fax:  [phone]  Name: Pamela Stein MRN: [id number] Date of Birth: 1955-07-18

## 2020-07-22 ENCOUNTER — Encounter (HOSPITAL_COMMUNITY): Payer: Self-pay | Admitting: Physical Therapy

## 2020-07-22 ENCOUNTER — Other Ambulatory Visit: Payer: Self-pay

## 2020-07-22 ENCOUNTER — Ambulatory Visit (HOSPITAL_COMMUNITY): Payer: Federal, State, Local not specified - PPO | Admitting: Physical Therapy

## 2020-07-22 DIAGNOSIS — Z9181 History of falling: Secondary | ICD-10-CM

## 2020-07-22 DIAGNOSIS — R2689 Other abnormalities of gait and mobility: Secondary | ICD-10-CM

## 2020-07-22 DIAGNOSIS — M6281 Muscle weakness (generalized): Secondary | ICD-10-CM | POA: Diagnosis not present

## 2020-07-22 NOTE — Therapy (Signed)
[place], [name], [address] Phone: [phone]   Fax:  [phone]  Physical Therapy Treatment  Patient Details  Name: Pamela Stein MRN: [id number] Date of Birth: 1955/12/04 Referring Provider (PT): Arther Abbott   Encounter Date: 07/22/2020   PT End of Session - 07/22/20 1103     Visit Number 13    Number of Visits 16    Date for PT Re-Evaluation 08/05/20    Authorization Type BCBS (no auth, VL [id number] used)    Authorization - Visit Number 13    Authorization - Number of Visits 42    Progress Note Due on Visit 20    PT Start Time 1005    PT Stop Time 1055    PT Time Calculation (min) 50 min    Activity Tolerance Patient tolerated treatment well;Patient limited by fatigue;No increased pain    Behavior During Therapy WFL for tasks assessed/performed             Past Medical History:  Diagnosis Date   Anxiety    Back pain    Bronchitis    Diabetes mellitus    Dizziness    Heart murmur    History of gout    Hyperlipidemia    Hypertension    Neuropathy    Obesity    Obstructive sleep apnea    CPAP    Past Surgical History:  Procedure Laterality Date   ABDOMINAL HYSTERECTOMY     ADENOIDECTOMY     CATARACT EXTRACTION Left    right 02/2018   COLONOSCOPY  2008   diminutive rectal polyp, s/p removal. polypoid mucosa   COLONOSCOPY WITH PROPOFOL N/A 01/03/2018   Procedure: COLONOSCOPY WITH PROPOFOL;  Surgeon: Daneil Dolin, MD;  [facility];  Service: Endoscopy;  Laterality: N/A;  12:00pm   DILATION AND CURETTAGE OF UTERUS     ENDOMETRIAL ABLATION     EYE SURGERY Bilateral 12/04/2013   cataract   PARATHYROIDECTOMY N/A 12/22/2016   Procedure: PARATHYROIDECTOMY, NECK EXPLORATION;  Surgeon: Jackolyn Confer, MD;  [facility]: [facility];  Service: General;  Laterality: N/A;   TONSILLECTOMY     TOTAL KNEE ARTHROPLASTY Left 05/28/2020   Procedure: LEFT TOTAL KNEE ARTHROPLASTY;   Surgeon: Carole Civil, MD;  Location: [facility];  Service: Orthopedics;  Laterality: Left;   WOUND EXPLORATION Left 05/31/2020   Procedure: WOUND EXPLORATION AND IRRIGATION LEFT KNEE;  Surgeon: Carole Civil, MD;  Location: [facility];  Service: Orthopedics;  Laterality: Left;  irrigation wound exploration, wound closure    There were no vitals filed for this visit.   Subjective Assessment - 07/22/20 1020     Subjective Reports no pain, states she has been doing her exercises.    Pertinent History HTN, DB    Patient Stated Goals to be able to get her left leg stronger so she doesn't have to use the walker.    Currently in Pain? No/denies                [facility] PT Assessment - 07/22/20 0001       Assessment   Medical Diagnosis L TKA    Referring Provider (PT) Arther Abbott    Onset Date/Surgical Date 05/28/20    Next MD Visit 08/04/20                           [facility] Adult PT Treatment/Exercise - 07/22/20 0001  Ambulation/Gait   Ambulation/Gait Yes    Gait Comments in // bars with 2 hands --> 1 hand with focus on longer steps and DF- a couple knee collapses but able to correct with 2 bars - 12 minutes      Knee/Hip Exercises: Standing   Step Down Left;3 sets;5 reps;Hand Hold: 2;Step Height: 4"    Other Standing Knee Exercises narrow BOS squat x15 knee blcked; SLS mini knee bends with knee blocked    Other Standing Knee Exercises forward slide - poor form stopped ; lunge forward - tactile cues to shift weight forward and only bend forward leg 10 minutes total R      Knee/Hip Exercises: Seated   Sit to Sand 20 reps;with UE support   favors right - cues to use left x2 sets                     PT Short Term Goals - 07/15/20 1010       PT SHORT TERM GOAL #1   Title Patient will report at least 50% improvement in overall symptoms and/or function to demonstrate improved functional mobility    Time 3    Period Weeks    Status  Achieved    Target Date 07/15/20      PT SHORT TERM GOAL #2   Title Patient will demonstrate 0-120 degrees of left knee ROM    Time 3    Period Weeks    Status On-going    Target Date 07/15/20      PT SHORT TERM GOAL #3   Title Patient will be independent in self management strategies to improve quality of life and functional outcomes.    Time 3    Period Weeks    Status On-going    Target Date 07/15/20               PT Long Term Goals - 07/15/20 1010       PT LONG TERM GOAL #1   Title Patient will report at least 75% improvement in overall symptoms and/or function to demonstrate improved functional mobility    Time 6    Period Weeks    Status Achieved      PT LONG TERM GOAL #2   Title Patient will improve on FOTO score to meet predicted outcomes to demonstrate improved functional mobility.    Time 6    Period Weeks    Status On-going      PT LONG TERM GOAL #3   Title Patient will be able to ambulate at least 350 feet in 2MWT with least restrictive assistive device in order to demonstrate improved gait speed for community ambulation.    Time 6    Period Weeks    Status On-going                   Plan - 07/22/20 1115     Clinical Impression Statement Continues to favor right leg with all movements, focus on knee extension activation, and bending knee with steps and walking. Loss of balance with walking and steps but performed all exercises in bars with knee blocked so able to safely correct loss of balance with PT or bar assist. Will continue to focus on increased left LE strengthening as tolerated.    Personal Factors and Comorbidities Age;Fitness;Behavior Pattern;Past/Current Experience;Comorbidity 3+;Time since onset of injury/illness/exacerbation    Comorbidities anxiety, DM, HTN    Examination-Activity Limitations Locomotion Level;Transfers;Squat;Stairs;Stand;Lift;Bend    Examination-Participation Restrictions  Cleaning;Occupation;Meal  Prep;Church;Community Activity;Shop;Volunteer;Yard Work    Stability/Clinical Decision Making Stable/Uncomplicated    Rehab Potential Good    PT Frequency 3x / week    PT Duration 6 weeks    PT Treatment/Interventions ADLs/Self Care Home Management;Aquatic Therapy;Cryotherapy;Electrical Stimulation;Iontophoresis 4mg /ml Dexamethasone;Moist Heat;Traction;Ultrasound;DME Instruction;Gait training;Stair training;Functional mobility training;Therapeutic activities;Therapeutic exercise;Balance training;Neuromuscular re-education;Patient/family education;Manual techniques;Manual lymph drainage;Compression bandaging;Dry needling;Energy conservation;Spinal Manipulations;Joint Manipulations;Splinting;Taping;Passive range of motion    PT Next Visit Plan closed kinetic chain strengtheing, guard knee, trial lumbar mobilizations, focus on quad strength (watch for adductor compensations), knee extension, weight shifts, gait, balance and knee flexion    PT Home Exercise Plan HH HEP, hamstring  stretch, HR, mini squat at counter; 6/2: Quad set, SAQ, SLR all direction, bridge every day    Consulted and Agree with Plan of Care Patient             Patient will benefit from skilled therapeutic intervention in order to improve the following deficits and impairments:  Abnormal gait, Decreased endurance, Decreased activity tolerance, Pain, Decreased balance, Impaired flexibility, Improper body mechanics, Decreased strength, Decreased mobility, Difficulty walking, Decreased scar mobility, Decreased skin integrity  Visit Diagnosis: Muscle weakness (generalized)  History of falling  Other abnormalities of gait and mobility     Problem List Patient Active Problem List   Diagnosis Date Noted   Chronic pain of left knee 05/31/2020   Wound dehiscence, surgical    Status post total left knee replacement 05/28/2020   Primary osteoarthritis of left knee    Polyneuropathy due to secondary diabetes mellitus ([place])  05/25/2019   Arthritis of right sacroiliac joint 05/25/2019   Cervical disc disorder 05/25/2019   Falls 05/25/2019   General unsteadiness 05/25/2019   Encounter for well woman exam with routine gynecological exam 01/09/2019   Screening for colorectal cancer 01/09/2019   Encounter for screening colonoscopy 12/13/2017   Hyperparathyroidism, primary ([place]) 12/22/2016   Vitamin D deficiency 10/31/2015   Hyperuricemia 05/08/2013   ONYCHOMYCOSIS, TOENAILS 02/22/2009   NEVI, MULTIPLE 02/22/2009   Diabetes mellitus ([place]) 01/25/2008   BACK PAIN WITH RADICULOPATHY 11/09/2007   Hyperlipidemia LDL goal <100 08/26/2007   Morbid obesity ([place]) 08/26/2007   Essential hypertension 08/26/2007   Obstructive sleep apnea 04/20/2007   11:15 AM, 07/22/20 Jerene Pitch, DPT Physical Therapy with [facility]  [phone] office   [place], [name], [address] Phone: [phone]   Fax:  [phone]  Name: Pamela Stein MRN: [id number] Date of Birth: [birth year]

## 2020-07-24 ENCOUNTER — Ambulatory Visit (HOSPITAL_COMMUNITY): Payer: Federal, State, Local not specified - PPO | Admitting: Physical Therapy

## 2020-07-24 ENCOUNTER — Other Ambulatory Visit: Payer: Self-pay

## 2020-07-24 ENCOUNTER — Encounter (HOSPITAL_COMMUNITY): Payer: Self-pay | Admitting: Physical Therapy

## 2020-07-24 DIAGNOSIS — M6281 Muscle weakness (generalized): Secondary | ICD-10-CM

## 2020-07-24 DIAGNOSIS — Z9181 History of falling: Secondary | ICD-10-CM | POA: Diagnosis not present

## 2020-07-24 DIAGNOSIS — R2689 Other abnormalities of gait and mobility: Secondary | ICD-10-CM

## 2020-07-24 NOTE — Therapy (Signed)
[place], [name], [address] Phone: [phone]   Fax:  [phone]  Physical Therapy Treatment  Patient Details  Name: Pamela Stein MRN: [id number] Date of Birth: Jul 05, 1955 Referring Provider (PT): Arther Abbott   Encounter Date: 07/24/2020   PT End of Session - 07/24/20 1044     Visit Number 14    Number of Visits 16    Date for PT Re-Evaluation 08/05/20    Authorization Type BCBS (no auth, [id number])    Authorization - Visit Number 14    Authorization - Number of Visits 42    Progress Note Due on Visit 20    PT Start Time 4496    PT Stop Time 1124    PT Time Calculation (min) 40 min    Activity Tolerance Patient tolerated treatment well;Patient limited by fatigue;No increased pain    Behavior During Therapy WFL for tasks assessed/performed             Past Medical History:  Diagnosis Date   Anxiety    Back pain    Bronchitis    Diabetes mellitus    Dizziness    Heart murmur    History of gout    Hyperlipidemia    Hypertension    Neuropathy    Obesity    Obstructive sleep apnea    CPAP    Past Surgical History:  Procedure Laterality Date   ABDOMINAL HYSTERECTOMY     ADENOIDECTOMY     CATARACT EXTRACTION Left    right 02/2018   COLONOSCOPY  2008   diminutive rectal polyp, s/p removal. polypoid mucosa   COLONOSCOPY WITH PROPOFOL N/A 01/03/2018   Procedure: COLONOSCOPY WITH PROPOFOL;  Surgeon: Daneil Dolin, MD;  [facility];  Service: Endoscopy;  Laterality: N/A;  12:00pm   DILATION AND CURETTAGE OF UTERUS     ENDOMETRIAL ABLATION     EYE SURGERY Bilateral 12/04/2013   cataract   PARATHYROIDECTOMY N/A 12/22/2016   Procedure: PARATHYROIDECTOMY, NECK EXPLORATION;  Surgeon: Jackolyn Confer, MD;  Location: [facility];  Service: General;  Laterality: N/A;   TONSILLECTOMY     TOTAL KNEE ARTHROPLASTY Left 05/28/2020   Procedure: LEFT TOTAL KNEE ARTHROPLASTY;   Surgeon: Carole Civil, MD;  Location: [facility];  Service: Orthopedics;  Laterality: Left;   WOUND EXPLORATION Left 05/31/2020   Procedure: WOUND EXPLORATION AND IRRIGATION LEFT KNEE;  Surgeon: Carole Civil, MD;  Location: [facility];  Service: Orthopedics;  Laterality: Left;  irrigation wound exploration, wound closure    There were no vitals filed for this visit.   Subjective Assessment - 07/24/20 1045     Subjective Patient states her leg has been tired. She has been working on [employer]    Pertinent History HTN, DB    Patient Stated Goals to be able to get her left leg stronger so she doesn't have to use the walker.    Currently in Pain? No/denies                               [facility] Adult PT Treatment/Exercise - 07/24/20 0001       Knee/Hip Exercises: Aerobic   Nustep LE only level 8 x 6 min for extension strength      Knee/Hip Exercises: Standing   Forward Lunges Left;10 reps;3 sets    Forward Step Up Left;2 sets;10 reps;Step Height: 4";Hand Hold: 2  Step Down Left;3 sets;5 reps;Hand Hold: 2;Step Height: 4"      Knee/Hip Exercises: Seated   Long Arc Quad AROM;Left;1 set;10 reps    Long [employer] Limitations 5 second holds                    PT Education - 07/24/20 1045     Education Details HEP    Person(s) Educated Patient    Methods Explanation;Demonstration    Comprehension Verbalized understanding;Returned demonstration              PT Short Term Goals - 07/15/20 1010       PT SHORT TERM GOAL #1   Title Patient will report at least 50% improvement in overall symptoms and/or function to demonstrate improved functional mobility    Time 3    Period Weeks    Status Achieved    Target Date 07/15/20      PT SHORT TERM GOAL #2   Title Patient will demonstrate 0-120 degrees of left knee ROM    Time 3    Period Weeks    Status On-going    Target Date 07/15/20      PT SHORT TERM GOAL #3   Title Patient will be independent  in self management strategies to improve quality of life and functional outcomes.    Time 3    Period Weeks    Status On-going    Target Date 07/15/20               PT Long Term Goals - 07/15/20 1010       PT LONG TERM GOAL #1   Title Patient will report at least 75% improvement in overall symptoms and/or function to demonstrate improved functional mobility    Time 6    Period Weeks    Status Achieved      PT LONG TERM GOAL #2   Title Patient will improve on FOTO score to meet predicted outcomes to demonstrate improved functional mobility.    Time 6    Period Weeks    Status On-going      PT LONG TERM GOAL #3   Title Patient will be able to ambulate at least 350 feet in 2MWT with least restrictive assistive device in order to demonstrate improved gait speed for community ambulation.    Time 6    Period Weeks    Status On-going                   Plan - 07/24/20 1045     Clinical Impression Statement Patient fatigues quickly with nustep with increased resistance for quad strength and dynamic warm up. Patient given manual and tactile cueing for stair exercise to avoid hyperextension and to control decent. Patient continues to demonstrate significantly impaired quad strength and motor control. She continues to lack TKE with LAQ.  Patient given cueing for lowering rather than weight shift with lunging with good carry over. Patient will continue to benefit from skilled physical therapy in order to reduce impairment and improve function.    Personal Factors and Comorbidities Age;Fitness;Behavior Pattern;Past/Current Experience;Comorbidity 3+;Time since onset of injury/illness/exacerbation    Comorbidities anxiety, DM, HTN    Examination-Activity Limitations Locomotion Level;Transfers;Squat;Stairs;Stand;Lift;Bend    Examination-Participation Restrictions Cleaning;Occupation;Meal Prep;Church;Community Activity;Shop;Volunteer;Yard Work    Stability/Clinical Decision Making  Stable/Uncomplicated    Rehab Potential Good    PT Frequency 3x / week    PT Duration 6 weeks    PT Treatment/Interventions ADLs/Self Care  Home Management;Aquatic Therapy;Cryotherapy;Electrical Stimulation;Iontophoresis 4mg /ml Dexamethasone;Moist Heat;Traction;Ultrasound;DME Instruction;Gait training;Stair training;Functional mobility training;Therapeutic activities;Therapeutic exercise;Balance training;Neuromuscular re-education;Patient/family education;Manual techniques;Manual lymph drainage;Compression bandaging;Dry needling;Energy conservation;Spinal Manipulations;Joint Manipulations;Splinting;Taping;Passive range of motion    PT Next Visit Plan closed kinetic chain strengtheing, guard knee, trial lumbar mobilizations, focus on quad strength (watch for adductor compensations), knee extension, weight shifts, gait, balance and knee flexion    PT Home Exercise Plan HH HEP, hamstring  stretch, HR, mini squat at counter; 6/2: Quad set, SAQ, SLR all direction, bridge every day    Consulted and Agree with Plan of Care Patient             Patient will benefit from skilled therapeutic intervention in order to improve the following deficits and impairments:  Abnormal gait, Decreased endurance, Decreased activity tolerance, Pain, Decreased balance, Impaired flexibility, Improper body mechanics, Decreased strength, Decreased mobility, Difficulty walking, Decreased scar mobility, Decreased skin integrity  Visit Diagnosis: Muscle weakness (generalized)  History of falling  Other abnormalities of gait and mobility     Problem List Patient Active Problem List   Diagnosis Date Noted   Chronic pain of left knee 05/31/2020   Wound dehiscence, surgical    Status post total left knee replacement 05/28/2020   Primary osteoarthritis of left knee    Polyneuropathy due to secondary diabetes mellitus ([place]) 05/25/2019   Arthritis of right sacroiliac joint 05/25/2019   Cervical disc disorder 05/25/2019    Falls 05/25/2019   General unsteadiness 05/25/2019   Encounter for well woman exam with routine gynecological exam 01/09/2019   Screening for colorectal cancer 01/09/2019   Encounter for screening colonoscopy 12/13/2017   Hyperparathyroidism, primary ([place]) 12/22/2016   Vitamin D deficiency 10/31/2015   Hyperuricemia 05/08/2013   ONYCHOMYCOSIS, TOENAILS 02/22/2009   NEVI, MULTIPLE 02/22/2009   Diabetes mellitus ([place]) 01/25/2008   BACK PAIN WITH RADICULOPATHY 11/09/2007   Hyperlipidemia LDL goal <100 08/26/2007   Morbid obesity ([place]) 08/26/2007   Essential hypertension 08/26/2007   Obstructive sleep apnea 04/20/2007    11:24 AM, 07/24/20 Mearl Latin PT, DPT Physical Therapist at [place], [name], [address] Phone: [phone]   Fax:  [phone]  Name: Pamela Stein MRN: [id number] Date of Birth: 1955-10-14

## 2020-07-24 NOTE — Patient Instructions (Signed)
Access Code: [email or web address] URL: [email or web address] Date: 07/24/2020 Prepared by: [facility] Tiffany Talarico  Exercises Seated Long Arc Quad - 3 x daily - 7 x weekly - 1 sets - 10 reps - 5 second hold

## 2020-07-26 ENCOUNTER — Other Ambulatory Visit: Payer: Self-pay

## 2020-07-26 ENCOUNTER — Ambulatory Visit (HOSPITAL_COMMUNITY): Payer: Federal, State, Local not specified - PPO | Admitting: Physical Therapy

## 2020-07-26 ENCOUNTER — Encounter (HOSPITAL_COMMUNITY): Payer: Self-pay | Admitting: Physical Therapy

## 2020-07-26 DIAGNOSIS — Z9181 History of falling: Secondary | ICD-10-CM | POA: Diagnosis not present

## 2020-07-26 DIAGNOSIS — M6281 Muscle weakness (generalized): Secondary | ICD-10-CM

## 2020-07-26 DIAGNOSIS — R2689 Other abnormalities of gait and mobility: Secondary | ICD-10-CM

## 2020-07-26 NOTE — Therapy (Signed)
[place] [address] [place], [name], [address] Phone: [phone]   Fax:  [phone]  Physical Therapy Treatment and Progress note  Patient Details  Name: Pamela Stein MRN: [id number] Date of Birth: 1955-12-08 Referring Provider (PT): Arther Abbott  Progress Note Reporting Period 07/15/20 to 07/26/20  See note below for Objective Data and Assessment of Progress/Goals.      Encounter Date: 07/26/2020   PT End of Session - 07/26/20 1005     Visit Number 15    Number of Visits 25    Date for PT Re-Evaluation 08/30/20    Authorization Type BCBS (no auth, VL [id number] used)    Authorization - Visit Number 15    Authorization - Number of Visits 42    Progress Note Due on Visit 25    PT Start Time 1004   late to check in   PT Stop Time 1042    PT Time Calculation (min) 38 min    Activity Tolerance Patient tolerated treatment well;Patient limited by fatigue;No increased pain    Behavior During Therapy WFL for tasks assessed/performed             Past Medical History:  Diagnosis Date   Anxiety    Back pain    Bronchitis    Diabetes mellitus    Dizziness    Heart murmur    History of gout    Hyperlipidemia    Hypertension    Neuropathy    Obesity    Obstructive sleep apnea    CPAP    Past Surgical History:  Procedure Laterality Date   ABDOMINAL HYSTERECTOMY     ADENOIDECTOMY     CATARACT EXTRACTION Left    right 02/2018   COLONOSCOPY  2008   diminutive rectal polyp, s/p removal. polypoid mucosa   COLONOSCOPY WITH PROPOFOL N/A 01/03/2018   Procedure: COLONOSCOPY WITH PROPOFOL;  Surgeon: Daneil Dolin, MD;  [facility];  Service: Endoscopy;  Laterality: N/A;  12:00pm   DILATION AND CURETTAGE OF UTERUS     ENDOMETRIAL ABLATION     EYE SURGERY Bilateral 12/04/2013   cataract   PARATHYROIDECTOMY N/A 12/22/2016   Procedure: PARATHYROIDECTOMY, NECK EXPLORATION;  Surgeon: Jackolyn Confer, MD;   [facility];  Service: General;  Laterality: N/A;   TONSILLECTOMY     TOTAL KNEE ARTHROPLASTY Left 05/28/2020   Procedure: LEFT TOTAL KNEE ARTHROPLASTY;  Surgeon: Carole Civil, MD;  Location: [facility];  Service: Orthopedics;  Laterality: Left;   WOUND EXPLORATION Left 05/31/2020   Procedure: WOUND EXPLORATION AND IRRIGATION LEFT KNEE;  Surgeon: Carole Civil, MD;  Location: [facility];  Service: Orthopedics;  Laterality: Left;  irrigation wound exploration, wound closure    There were no vitals filed for this visit.   Subjective Assessment - 07/26/20 1009     Subjective States overall she is tired, the exercises are a lot. States that she wants her knee to just be stronger. Patient reports she feels overall 90% better    Pertinent History HTN, DB    Patient Stated Goals to be able to get her left leg stronger so she doesn't have to use the walker.    Currently in Pain? No/denies                [facility] PT Assessment - 07/26/20 0001       Assessment   Medical Diagnosis L TKA    Referring Provider (PT) Arther Abbott  Onset Date/Surgical Date 05/28/20    Next MD Visit 08/14/20      Observation/Other Assessments   Focus on Therapeutic Outcomes (FOTO)  52% function, predicted 52% function      Ambulation/Gait   Ambulation/Gait Yes    Ambulation Distance (Feet) 250 Feet    Assistive device Rolling walker    Gait Pattern Decreased stance time - left;Decreased dorsiflexion - left;Decreased weight shift to left;Decreased hip/knee flexion - left    Ambulation Surface Level;Indoor    Gait velocity decreased    Gait Comments 2MW                           [facility] Adult PT Treatment/Exercise - 07/26/20 0001       Knee/Hip Exercises: Seated   Long Arc Quad 20 reps;Left;Strengthening   2 sets with contralateral assist for end range and 5" holds - slow eccentric lower     Knee/Hip Exercises: Supine   Knee Extension AROM    Knee Extension Limitations 0     Knee Flexion AROM    Knee Flexion Limitations 125                    PT Education - 07/26/20 1046     Education Details on FOTO score, presentation and progress made, plan moving forward    Person(s) Educated Patient    Methods Explanation    Comprehension Verbalized understanding              PT Short Term Goals - 07/15/20 1010       PT SHORT TERM GOAL #1   Title Patient will report at least 50% improvement in overall symptoms and/or function to demonstrate improved functional mobility    Time 3    Period Weeks    Status Achieved    Target Date 07/15/20      PT SHORT TERM GOAL #2   Title Patient will demonstrate 0-120 degrees of left knee ROM    Time 3    Period Weeks    Status On-going    Target Date 07/15/20      PT SHORT TERM GOAL #3   Title Patient will be independent in self management strategies to improve quality of life and functional outcomes.    Time 3    Period Weeks    Status On-going    Target Date 07/15/20               PT Long Term Goals - 07/26/20 1018       PT LONG TERM GOAL #1   Title Patient will report at least 75% improvement in overall symptoms and/or function to demonstrate improved functional mobility    Time 6    Period Weeks    Status Achieved      PT LONG TERM GOAL #2   Title Patient will improve on FOTO score to meet predicted outcomes to demonstrate improved functional mobility.    Time 6    Period Weeks    Status Achieved      PT LONG TERM GOAL #3   Title Patient will be able to ambulate at least 350 feet in 2MWT with least restrictive assistive device in order to demonstrate improved gait speed for community ambulation.    Baseline 250 feet with RW    Time 6    Period Weeks    Status On-going      PT LONG TERM GOAL #4  Title Patient will be able to ambulate at least 100 feet with cane safely    Time 5    Period Weeks    Status New    Target Date 08/30/20                   Plan -  07/26/20 1044     Clinical Impression Statement Progress note performed on this date. Patient ahs met all but walking goal at this time and continues to be limited in terminal knee extension. Added LAQ with contralateral assist for end range of motion with isometric hold. Fatigue noted but improved active TKE noted against gravity. Extending PO additional 5 weeks at 2x/week to work on walking goal and to transition to walking with cane once strength improves.    Personal Factors and Comorbidities Age;Fitness;Behavior Pattern;Past/Current Experience;Comorbidity 3+;Time since onset of injury/illness/exacerbation    Comorbidities anxiety, DM, HTN    Examination-Activity Limitations Locomotion Level;Transfers;Squat;Stairs;Stand;Lift;Bend    Examination-Participation Restrictions Cleaning;Occupation;Meal Prep;Church;Community Activity;Shop;Volunteer;Yard Work    Stability/Clinical Decision Making Stable/Uncomplicated    Rehab Potential Good    PT Frequency 2x / week    PT Duration Other (comment)   5 weeks   PT Treatment/Interventions ADLs/Self Care Home Management;Aquatic Therapy;Cryotherapy;Electrical Stimulation;Iontophoresis 80m/ml Dexamethasone;Moist Heat;Traction;Ultrasound;DME Instruction;Gait training;Stair training;Functional mobility training;Therapeutic activities;Therapeutic exercise;Balance training;Neuromuscular re-education;Patient/family education;Manual techniques;Manual lymph drainage;Compression bandaging;Dry needling;Energy conservation;Spinal Manipulations;Joint Manipulations;Splinting;Taping;Passive range of motion    PT Next Visit Plan closed kinetic chain strengtheing, guard knee, trial lumbar mobilizations, focus on quad strength (watch for adductor compensations), knee extension, weight shifts, gait, balance and knee flexion    PT Home Exercise Plan HH HEP, hamstring  stretch, HR, mini squat at counter; 6/2: Quad set, SAQ, SLR all direction, bridge every day; LAQ with contral  lateral assist    Consulted and Agree with Plan of Care Patient             Patient will benefit from skilled therapeutic intervention in order to improve the following deficits and impairments:  Abnormal gait, Decreased endurance, Decreased activity tolerance, Pain, Decreased balance, Impaired flexibility, Improper body mechanics, Decreased strength, Decreased mobility, Difficulty walking, Decreased scar mobility, Decreased skin integrity  Visit Diagnosis: History of falling  Muscle weakness (generalized)  Other abnormalities of gait and mobility     Problem List Patient Active Problem List   Diagnosis Date Noted   Chronic pain of left knee 05/31/2020   Wound dehiscence, surgical    Status post total left knee replacement 05/28/2020   Primary osteoarthritis of left knee    Polyneuropathy due to secondary diabetes mellitus ([place] 05/25/2019   Arthritis of right sacroiliac joint 05/25/2019   Cervical disc disorder 05/25/2019   Falls 05/25/2019   General unsteadiness 05/25/2019   Encounter for well woman exam with routine gynecological exam 01/09/2019   Screening for colorectal cancer 01/09/2019   Encounter for screening colonoscopy 12/13/2017   Hyper[place]roidism, primary ([place] 12/22/2016   Vitamin D deficiency 10/31/2015   Hyperuricemia 05/08/2013   ONYCHOMYCOSIS, TOENAILS 02/22/2009   NEVI, MULTIPLE 02/22/2009   Diabetes [place] 01/25/2008   BACK PAIN WITH RADICULOPATHY 11/09/2007   Hyperlipidemia LDL goal <100 08/26/2007   Morbid obesity ([place] 08/26/2007   Essential hypertension 08/26/2007   Obstructive sleep apnea 04/20/2007   10:48 AM, 07/26/20 MJerene Pitch DPT [facility] [phone]   [place] [address]Phone: [phone]  Fax:  [phone] Name: Pamela  JENNETTE Stein MRN: [id number] Date of Birth: 1955/07/12

## 2020-07-29 ENCOUNTER — Encounter (HOSPITAL_COMMUNITY): Payer: Federal, State, Local not specified - PPO | Admitting: Physical Therapy

## 2020-07-31 ENCOUNTER — Other Ambulatory Visit: Payer: Self-pay

## 2020-07-31 ENCOUNTER — Encounter (HOSPITAL_COMMUNITY): Payer: Self-pay | Admitting: Physical Therapy

## 2020-07-31 ENCOUNTER — Ambulatory Visit (HOSPITAL_COMMUNITY): Payer: Federal, State, Local not specified - PPO | Admitting: Physical Therapy

## 2020-07-31 DIAGNOSIS — R2689 Other abnormalities of gait and mobility: Secondary | ICD-10-CM

## 2020-07-31 DIAGNOSIS — M6281 Muscle weakness (generalized): Secondary | ICD-10-CM | POA: Diagnosis not present

## 2020-07-31 DIAGNOSIS — Z9181 History of falling: Secondary | ICD-10-CM | POA: Diagnosis not present

## 2020-07-31 NOTE — Therapy (Signed)
[place], [name], [address] Phone: [phone]   Fax:  [phone]  Physical Therapy Treatment  Patient Details  Name: Pamela Stein MRN: [id number] Date of Birth: 1955/05/29 Referring Provider (PT): Arther Abbott   Encounter Date: 07/31/2020   PT End of Session - 07/31/20 1002     Visit Number 16    Number of Visits 25    Date for PT Re-Evaluation 08/30/20    Authorization Type BCBS (no auth, [id number])    Authorization - Visit Number 16    Authorization - Number of Visits 42    Progress Note Due on Visit 25    PT Start Time 9357    PT Stop Time 1042    PT Time Calculation (min) 40 min    Activity Tolerance Patient tolerated treatment well;Patient limited by fatigue;No increased pain    Behavior During Therapy WFL for tasks assessed/performed             Past Medical History:  Diagnosis Date   Anxiety    Back pain    Bronchitis    Diabetes mellitus    Dizziness    Heart murmur    History of gout    Hyperlipidemia    Hypertension    Neuropathy    Obesity    Obstructive sleep apnea    CPAP    Past Surgical History:  Procedure Laterality Date   ABDOMINAL HYSTERECTOMY     ADENOIDECTOMY     CATARACT EXTRACTION Left    right 02/2018   COLONOSCOPY  2008   diminutive rectal polyp, s/p removal. polypoid mucosa   COLONOSCOPY WITH PROPOFOL N/A 01/03/2018   Procedure: COLONOSCOPY WITH PROPOFOL;  Surgeon: Daneil Dolin, MD;  [facility];  Service: Endoscopy;  Laterality: N/A;  12:00pm   DILATION AND CURETTAGE OF UTERUS     ENDOMETRIAL ABLATION     EYE SURGERY Bilateral 12/04/2013   cataract   PARATHYROIDECTOMY N/A 12/22/2016   Procedure: PARATHYROIDECTOMY, NECK EXPLORATION;  Surgeon: Jackolyn Confer, MD;  Location: [facility];  Service: General;  Laterality: N/A;   TONSILLECTOMY     TOTAL KNEE ARTHROPLASTY Left 05/28/2020   Procedure: LEFT TOTAL KNEE ARTHROPLASTY;   Surgeon: Carole Civil, MD;  Location: [facility];  Service: Orthopedics;  Laterality: Left;   WOUND EXPLORATION Left 05/31/2020   Procedure: WOUND EXPLORATION AND IRRIGATION LEFT KNEE;  Surgeon: Carole Civil, MD;  Location: [facility];  Service: Orthopedics;  Laterality: Left;  irrigation wound exploration, wound closure    There were no vitals filed for this visit.   Subjective Assessment - 07/31/20 1003     Subjective Patient states no issues. Home exercises are going alright, just wear her out.    Pertinent History HTN, DB    Patient Stated Goals to be able to get her left leg stronger so she doesn't have to use the walker.    Currently in Pain? No/denies                               [facility] Adult PT Treatment/Exercise - 07/31/20 0001       Knee/Hip Exercises: Standing   Heel Raises Both;1 set;20 reps    Heel Raises Limitations TR x 20    Forward Lunges Left;10 reps;3 sets    Forward Step Up Left;2 sets;10 reps;Step Height: 4";Hand Hold: 2    Step  Down Left;Hand Hold: 2;Step Height: 4";2 sets;10 reps      Knee/Hip Exercises: Seated   Long Arc Quad Left;Strengthening;2 sets;10 reps    Long Arc Quad Limitations 2 sets with contralateral assist for end range and 5" holds - slow eccentric lower                    PT Education - 07/31/20 1005     Education Details HEP    Person(s) Educated Patient    Methods Explanation;Demonstration    Comprehension Verbalized understanding;Returned demonstration              PT Short Term Goals - 07/15/20 1010       PT SHORT TERM GOAL #1   Title Patient will report at least 50% improvement in overall symptoms and/or function to demonstrate improved functional mobility    Time 3    Period Weeks    Status Achieved    Target Date 07/15/20      PT SHORT TERM GOAL #2   Title Patient will demonstrate 0-120 degrees of left knee ROM    Time 3    Period Weeks    Status On-going    Target Date  07/15/20      PT SHORT TERM GOAL #3   Title Patient will be independent in self management strategies to improve quality of life and functional outcomes.    Time 3    Period Weeks    Status On-going    Target Date 07/15/20               PT Long Term Goals - 07/26/20 1018       PT LONG TERM GOAL #1   Title Patient will report at least 75% improvement in overall symptoms and/or function to demonstrate improved functional mobility    Time 6    Period Weeks    Status Achieved      PT LONG TERM GOAL #2   Title Patient will improve on FOTO score to meet predicted outcomes to demonstrate improved functional mobility.    Time 6    Period Weeks    Status Achieved      PT LONG TERM GOAL #3   Title Patient will be able to ambulate at least 350 feet in 2MWT with least restrictive assistive device in order to demonstrate improved gait speed for community ambulation.    Baseline 250 feet with RW    Time 6    Period Weeks    Status On-going      PT LONG TERM GOAL #4   Title Patient will be able to ambulate at least 100 feet with cane safely    Time 5    Period Weeks    Status New    Target Date 08/30/20                   Plan - 07/31/20 1002     Clinical Impression Statement Patient showing improving quad strength with LAQ but continues to require contralateral LE assist for TKE in open chain. Patient continues to require bilateral UE support with standing exercises secondary to impaired quad strength, motor control, and balance. Patient given tactile and verbal cueing for motor control with stair exercises. Patient given cueing for reducing UE support as able with exercises. Patient will continue to benefit from skilled physical therapy in order to reduce impairment and improve function.    Personal Factors and Comorbidities Age;Fitness;Behavior Pattern;Past/Current Experience;Comorbidity 3+;Time since  onset of injury/illness/exacerbation    Comorbidities anxiety, DM,  HTN    Examination-Activity Limitations Locomotion Level;Transfers;Squat;Stairs;Stand;Lift;Bend    Examination-Participation Restrictions Cleaning;Occupation;Meal Prep;Church;Community Activity;Shop;Volunteer;Yard Work    Stability/Clinical Decision Making Stable/Uncomplicated    Rehab Potential Good    PT Frequency 2x / week    PT Duration Other (comment)   5 weeks   PT Treatment/Interventions ADLs/Self Care Home Management;Aquatic Therapy;Cryotherapy;Electrical Stimulation;Iontophoresis 4mg /ml Dexamethasone;Moist Heat;Traction;Ultrasound;DME Instruction;Gait training;Stair training;Functional mobility training;Therapeutic activities;Therapeutic exercise;Balance training;Neuromuscular re-education;Patient/family education;Manual techniques;Manual lymph drainage;Compression bandaging;Dry needling;Energy conservation;Spinal Manipulations;Joint Manipulations;Splinting;Taping;Passive range of motion    PT Next Visit Plan closed kinetic chain strengtheing, guard knee, trial lumbar mobilizations, focus on quad strength (watch for adductor compensations), knee extension, weight shifts, gait, balance and knee flexion    PT Home Exercise Plan HH HEP, hamstring  stretch, HR, mini squat at counter; 6/2: Quad set, SAQ, SLR all direction, bridge every day; LAQ with contral lateral assist    Consulted and Agree with Plan of Care Patient             Patient will benefit from skilled therapeutic intervention in order to improve the following deficits and impairments:  Abnormal gait, Decreased endurance, Decreased activity tolerance, Pain, Decreased balance, Impaired flexibility, Improper body mechanics, Decreased strength, Decreased mobility, Difficulty walking, Decreased scar mobility, Decreased skin integrity  Visit Diagnosis: History of falling  Muscle weakness (generalized)  Other abnormalities of gait and mobility     Problem List Patient Active Problem List   Diagnosis Date Noted   Chronic  pain of left knee 05/31/2020   Wound dehiscence, surgical    Status post total left knee replacement 05/28/2020   Primary osteoarthritis of left knee    Polyneuropathy due to secondary diabetes mellitus ([place]) 05/25/2019   Arthritis of right sacroiliac joint 05/25/2019   Cervical disc disorder 05/25/2019   Falls 05/25/2019   General unsteadiness 05/25/2019   Encounter for well woman exam with routine gynecological exam 01/09/2019   Screening for colorectal cancer 01/09/2019   Encounter for screening colonoscopy 12/13/2017   Hyperparathyroidism, primary ([place]) 12/22/2016   Vitamin D deficiency 10/31/2015   Hyperuricemia 05/08/2013   ONYCHOMYCOSIS, TOENAILS 02/22/2009   NEVI, MULTIPLE 02/22/2009   Diabetes mellitus ([place]) 01/25/2008   BACK PAIN WITH RADICULOPATHY 11/09/2007   Hyperlipidemia LDL goal <100 08/26/2007   Morbid obesity ([place]) 08/26/2007   Essential hypertension 08/26/2007   Obstructive sleep apnea 04/20/2007    10:41 AM, 07/31/20 Mearl Latin PT, DPT Physical Therapist at [place], [name], [address] Phone: [phone]   Fax:  [phone]  Name: Pamela Stein MRN: [id number] Date of Birth: Nov 19, 1955

## 2020-08-01 ENCOUNTER — Telehealth: Payer: Self-pay | Admitting: Orthopedic Surgery

## 2020-08-01 DIAGNOSIS — Z96652 Presence of left artificial knee joint: Secondary | ICD-10-CM

## 2020-08-01 NOTE — Telephone Encounter (Signed)
Patient called to ask if it is okay for her to drive on Sunday, 5/99/35, to her church event, a farewell dinner, which is in [place]. It would be less than a 15 minute drive, she said.

## 2020-08-01 NOTE — Telephone Encounter (Signed)
Yes, she can drive now per Dr Aline Brochure I have called to let her know, start with short distances increase as tolerate.

## 2020-08-02 ENCOUNTER — Encounter (HOSPITAL_COMMUNITY): Payer: Self-pay

## 2020-08-02 ENCOUNTER — Other Ambulatory Visit: Payer: Self-pay

## 2020-08-02 ENCOUNTER — Ambulatory Visit (HOSPITAL_COMMUNITY): Payer: Federal, State, Local not specified - PPO

## 2020-08-02 DIAGNOSIS — R2689 Other abnormalities of gait and mobility: Secondary | ICD-10-CM | POA: Diagnosis not present

## 2020-08-02 DIAGNOSIS — Z9181 History of falling: Secondary | ICD-10-CM | POA: Diagnosis not present

## 2020-08-02 DIAGNOSIS — M6281 Muscle weakness (generalized): Secondary | ICD-10-CM | POA: Diagnosis not present

## 2020-08-02 NOTE — Therapy (Signed)
[place], [name], [address] Phone: [phone]   Fax:  [phone]  Physical Therapy Treatment  Patient Details  Name: Pamela Stein MRN: [id number] Date of Birth: [birth year] Referring Provider (PT): Arther Abbott   Encounter Date: 08/02/2020   PT End of Session - 08/02/20 1015     Visit Number 17    Number of Visits 25    Date for PT Re-Evaluation 08/30/20    Authorization Type BCBS (no auth, VL [id number] used)    Authorization - Visit Number 17    Authorization - Number of Visits 42    Progress Note Due on Visit 25    PT Start Time 1003    PT Stop Time 1052    PT Time Calculation (min) 49 min    Activity Tolerance Patient tolerated treatment well;Patient limited by fatigue;No increased pain    Behavior During Therapy WFL for tasks assessed/performed             Past Medical History:  Diagnosis Date   Anxiety    Back pain    Bronchitis    Diabetes mellitus    Dizziness    Heart murmur    History of gout    Hyperlipidemia    Hypertension    Neuropathy    Obesity    Obstructive sleep apnea    CPAP    Past Surgical History:  Procedure Laterality Date   ABDOMINAL HYSTERECTOMY     ADENOIDECTOMY     CATARACT EXTRACTION Left    right 02/2018   COLONOSCOPY  2008   diminutive rectal polyp, s/p removal. polypoid mucosa   COLONOSCOPY WITH PROPOFOL N/A 01/03/2018   Procedure: COLONOSCOPY WITH PROPOFOL;  Surgeon: Daneil Dolin, MD;  [facility];  Service: Endoscopy;  Laterality: N/A;  12:00pm   DILATION AND CURETTAGE OF UTERUS     ENDOMETRIAL ABLATION     EYE SURGERY Bilateral 12/04/2013   cataract   PARATHYROIDECTOMY N/A 12/22/2016   Procedure: PARATHYROIDECTOMY, NECK EXPLORATION;  Surgeon: Jackolyn Confer, MD;  [facility]: [facility];  Service: General;  Laterality: N/A;   TONSILLECTOMY     TOTAL KNEE ARTHROPLASTY Left 05/28/2020   Procedure: LEFT TOTAL KNEE ARTHROPLASTY;   Surgeon: Carole Civil, MD;  Location: [facility];  Service: Orthopedics;  Laterality: Left;   WOUND EXPLORATION Left 05/31/2020   Procedure: WOUND EXPLORATION AND IRRIGATION LEFT KNEE;  Surgeon: Carole Civil, MD;  Location: [facility];  Service: Orthopedics;  Laterality: Left;  irrigation wound exploration, wound closure    There were no vitals filed for this visit.   Subjective Assessment - 08/02/20 1014     Subjective Knee is feeling good.    Pertinent History HTN, DB    Patient Stated Goals to be able to get her left leg stronger so she doesn't have to use the walker.    Currently in Pain? No/denies                [facility] PT Assessment - 08/02/20 0001       Assessment   Medical Diagnosis L TKA    Referring Provider (PT) Arther Abbott    Onset Date/Surgical Date 05/28/20    Next MD Visit 08/14/20                           [facility] Adult PT Treatment/Exercise - 08/02/20 0001       Knee/Hip  Exercises: Aerobic   Nustep --      Knee/Hip Exercises: Machines for Strengthening   Cybex Leg Press 1Pl Lt only slow control, cueing for form      Knee/Hip Exercises: Standing   Heel Raises Both;1 set;20 reps    Heel Raises Limitations TR x 20 incline slope    Lateral Step Up Left;2 sets;10 reps;Hand Hold: 2    Lateral Step Up Limitations cueing for mechanics, heavy UE    Forward Step Up Left;2 sets;10 reps;Hand Hold: 1;Step Height: 4"    Step Down Left;Hand Hold: 2;Step Height: 4";2 sets;10 reps    Wall Squat 2 sets;5 reps;5 seconds    SLS 3x 30" wtih 1 finger; cueing to reduce knee locking                      PT Short Term Goals - 07/15/20 1010       PT SHORT TERM GOAL #1   Title Patient will report at least 50% improvement in overall symptoms and/or function to demonstrate improved functional mobility    Time 3    Period Weeks    Status Achieved    Target Date 07/15/20      PT SHORT TERM GOAL #2   Title Patient will demonstrate  0-120 degrees of left knee ROM    Time 3    Period Weeks    Status On-going    Target Date 07/15/20      PT SHORT TERM GOAL #3   Title Patient will be independent in self management strategies to improve quality of life and functional outcomes.    Time 3    Period Weeks    Status On-going    Target Date 07/15/20               PT Long Term Goals - 07/26/20 1018       PT LONG TERM GOAL #1   Title Patient will report at least 75% improvement in overall symptoms and/or function to demonstrate improved functional mobility    Time 6    Period Weeks    Status Achieved      PT LONG TERM GOAL #2   Title Patient will improve on FOTO score to meet predicted outcomes to demonstrate improved functional mobility.    Time 6    Period Weeks    Status Achieved      PT LONG TERM GOAL #3   Title Patient will be able to ambulate at least 350 feet in 2MWT with least restrictive assistive device in order to demonstrate improved gait speed for community ambulation.    Baseline 250 feet with RW    Time 6    Period Weeks    Status On-going      PT LONG TERM GOAL #4   Title Patient will be able to ambulate at least 100 feet with cane safely    Time 5    Period Weeks    Status New    Target Date 08/30/20                   Plan - 08/02/20 1053     Clinical Impression Statement Pt continues to show quad weakness and relys on joint locking during gait to keep knee from buckling.  Educated on importance of quad activation for maximal strengthening benefits.  Added mini wall squats for quad activation at different degrees, pt nervous with new exercise but improved with reps.  No  reports of pain, was limited by [place] wiht activities.    Personal Factors and Comorbidities Age;Fitness;Behavior Pattern;Past/Current Experience;Comorbidity 3+;Time since onset of injury/illness/exacerbation    Comorbidities anxiety, DM, HTN    Examination-Activity Limitations Locomotion  Level;Transfers;Squat;Stairs;Stand;Lift;Bend    Examination-Participation Restrictions Cleaning;Occupation;Meal Prep;Church;Community Activity;Shop;Volunteer;Yard Work    Stability/Clinical Decision Making Stable/Uncomplicated    Designer, [employer]ell[employer]    Rehab Potential Good    PT Frequency 2x / week    PT Duration --   5 weeks   PT Treatment/Interventions ADLs/Self Care Home Management;Aquatic Therapy;Cryotherapy;Electrical Stimulation;Iontophoresis 4mg /ml Dexamethasone;Moist Heat;Traction;Ultrasound;DME Instruction;Gait training;Stair training;Functional mobility training;Therapeutic activities;Therapeutic exercise;Balance training;Neuromuscular re-education;Patient/family education;Manual techniques;Manual lymph drainage;Compression bandaging;Dry needling;Energy conservation;Spinal Manipulations;Joint Manipulations;Splinting;Taping;Passive range of motion    PT Next Visit Plan closed kinetic chain strengtheing, guard knee, trial lumbar mobilizations, focus on quad strength (watch for adductor compensations), knee extension, weight shifts, gait, balance and knee flexion    PT Home Exercise Plan HH HEP, hamstring  stretch, HR, mini squat at counter; 6/2: Quad set, SAQ, SLR all direction, bridge every day; LAQ with contral lateral assist    Consulted and Agree with Plan of Care Patient             Patient will benefit from skilled therapeutic intervention in order to improve the following deficits and impairments:  Abnormal gait, Decreased endurance, Decreased activity tolerance, Pain, Decreased balance, Impaired flexibility, Improper body mechanics, Decreased strength, Decreased mobility, Difficulty walking, Decreased scar mobility, Decreased skin integrity  Visit Diagnosis: History of falling  Muscle weakness (generalized)  Other abnormalities of gait and mobility     Problem List Patient Active Problem List   Diagnosis Date Noted   Chronic pain of left knee 05/31/2020    Wound dehiscence, surgical    Status post total left knee replacement 05/28/2020   Primary osteoarthritis of left knee    Polyneuropathy due to secondary diabetes mellitus ([place]) 05/25/2019   Arthritis of right sacroiliac joint 05/25/2019   Cervical disc disorder 05/25/2019   Falls 05/25/2019   General unsteadiness 05/25/2019   Encounter for well woman exam with routine gynecological exam 01/09/2019   Screening for colorectal cancer 01/09/2019   Encounter for screening colonoscopy 12/13/2017   Hyperparathyroidism, primary ([place]) 12/22/2016   Vitamin D deficiency 10/31/2015   Hyperuricemia 05/08/2013   ONYCHOMYCOSIS, TOENAILS 02/22/2009   NEVI, MULTIPLE 02/22/2009   Diabetes mellitus ([place]) 01/25/2008   BACK PAIN WITH RADICULOPATHY 11/09/2007   Hyperlipidemia LDL goal <100 08/26/2007   Morbid obesity ([place]) 08/26/2007   Essential hypertension 08/26/2007   Obstructive sleep apnea 04/20/2007   Ihor Austin, LPTA/CLT; CBIS [phone]  Aldona Lento 08/02/2020, 1:25 PM  [place], [name], [address] Phone: [phone]   Fax:  [phone]  Name: Pamela Stein MRN: [id number] Date of Birth: Jan 23, 1956

## 2020-08-05 ENCOUNTER — Ambulatory Visit (HOSPITAL_COMMUNITY): Payer: Federal, State, Local not specified - PPO | Admitting: Physical Therapy

## 2020-08-05 ENCOUNTER — Other Ambulatory Visit: Payer: Self-pay

## 2020-08-05 DIAGNOSIS — Z9181 History of falling: Secondary | ICD-10-CM | POA: Diagnosis not present

## 2020-08-05 DIAGNOSIS — M6281 Muscle weakness (generalized): Secondary | ICD-10-CM

## 2020-08-05 DIAGNOSIS — E1142 Type 2 diabetes mellitus with diabetic polyneuropathy: Secondary | ICD-10-CM | POA: Diagnosis not present

## 2020-08-05 DIAGNOSIS — R2689 Other abnormalities of gait and mobility: Secondary | ICD-10-CM

## 2020-08-05 DIAGNOSIS — R296 Repeated falls: Secondary | ICD-10-CM | POA: Diagnosis not present

## 2020-08-05 DIAGNOSIS — G629 Polyneuropathy, unspecified: Secondary | ICD-10-CM | POA: Diagnosis not present

## 2020-08-05 DIAGNOSIS — G4733 Obstructive sleep apnea (adult) (pediatric): Secondary | ICD-10-CM | POA: Diagnosis not present

## 2020-08-05 NOTE — Therapy (Signed)
[place], [name], [address] Phone: [phone]   Fax:  [phone]  Physical Therapy Treatment  Patient Details  Name: Pamela Stein MRN: [id number] Date of Birth: 1956-01-15 Referring Provider (PT): Arther Abbott   Encounter Date: 08/05/2020   PT End of Session - 08/05/20 1606     Visit Number 18    Number of Visits 25    Date for PT Re-Evaluation 08/30/20    Authorization Type BCBS (no auth, [id number])    Authorization - Visit Number 18    Authorization - Number of Visits 42    Progress Note Due on Visit 25    PT Start Time 0920    PT Stop Time 1000    PT Time Calculation (min) 40 min    Activity Tolerance Patient tolerated treatment well;Patient limited by fatigue;No increased pain    Behavior During Therapy WFL for tasks assessed/performed             Past Medical History:  Diagnosis Date   Anxiety    Back pain    Bronchitis    Diabetes mellitus    Dizziness    Heart murmur    History of gout    Hyperlipidemia    Hypertension    Neuropathy    Obesity    Obstructive sleep apnea    CPAP    Past Surgical History:  Procedure Laterality Date   ABDOMINAL HYSTERECTOMY     ADENOIDECTOMY     CATARACT EXTRACTION Left    right 02/2018   COLONOSCOPY  2008   diminutive rectal polyp, s/p removal. polypoid mucosa   COLONOSCOPY WITH PROPOFOL N/A 01/03/2018   Procedure: COLONOSCOPY WITH PROPOFOL;  Surgeon: Daneil Dolin, MD;  [facility];  Service: Endoscopy;  Laterality: N/A;  12:00pm   DILATION AND CURETTAGE OF UTERUS     ENDOMETRIAL ABLATION     EYE SURGERY Bilateral 12/04/2013   cataract   PARATHYROIDECTOMY N/A 12/22/2016   Procedure: PARATHYROIDECTOMY, NECK EXPLORATION;  Surgeon: Jackolyn Confer, MD;  Location: [facility];  Service: General;  Laterality: N/A;   TONSILLECTOMY     TOTAL KNEE ARTHROPLASTY Left 05/28/2020   Procedure: LEFT TOTAL KNEE ARTHROPLASTY;   Surgeon: Carole Civil, MD;  Location: [facility];  Service: Orthopedics;  Laterality: Left;   WOUND EXPLORATION Left 05/31/2020   Procedure: WOUND EXPLORATION AND IRRIGATION LEFT KNEE;  Surgeon: Carole Civil, MD;  Location: [facility];  Service: Orthopedics;  Laterality: Left;  irrigation wound exploration, wound closure    There were no vitals filed for this visit.   Subjective Assessment - 08/05/20 0935     Subjective Pt states she feels frustrated because her progress is so slow.  (surgery was 4/19).  comes today using RW and reports end range Lt quad weakness and feelings of "giving way".    Currently in Pain? No/denies                               [facility] Adult PT Treatment/Exercise - 08/05/20 0001       Ambulation/Gait   Ambulation/Gait Yes    Ambulation Distance (Feet) 250 Feet    Assistive device Rolling walker      Knee/Hip Exercises: Standing   Heel Raises Both;1 set;20 reps    Heel Raises Limitations TR x 20 incline slope    Forward Lunges Left;10 reps;3 sets  Forward Lunges Limitations onto 4" step no UE's working on stability    Lateral Step Up Left;2 sets;10 reps;Hand Hold: 2    Lateral Step Up Limitations slow and controlled isolating quad    Forward Step Up 2 sets;10 reps;Step Height: 4"    Forward Step Up Limitations slow and controlled isolating quad    SLS 3x 30" wtih 1 finger; cueing to reduce knee locking      Knee/Hip Exercises: Seated   Long Arc Quad Left;Strengthening;2 sets;10 reps    Long Arc Quad Limitations 2 sets with contralateral assist for end range and 5" holds - slow eccentric lower      Knee/Hip Exercises: Supine   Short Arc Quad Sets 2 sets;5 reps;Both                      PT Short Term Goals - 07/15/20 1010       PT SHORT TERM GOAL #1   Title Patient will report at least 50% improvement in overall symptoms and/or function to demonstrate improved functional mobility    Time 3    Period Weeks     Status Achieved    Target Date 07/15/20      PT SHORT TERM GOAL #2   Title Patient will demonstrate 0-120 degrees of left knee ROM    Time 3    Period Weeks    Status On-going    Target Date 07/15/20      PT SHORT TERM GOAL #3   Title Patient will be independent in self management strategies to improve quality of life and functional outcomes.    Time 3    Period Weeks    Status On-going    Target Date 07/15/20               PT Long Term Goals - 07/26/20 1018       PT LONG TERM GOAL #1   Title Patient will report at least 75% improvement in overall symptoms and/or function to demonstrate improved functional mobility    Time 6    Period Weeks    Status Achieved      PT LONG TERM GOAL #2   Title Patient will improve on FOTO score to meet predicted outcomes to demonstrate improved functional mobility.    Time 6    Period Weeks    Status Achieved      PT LONG TERM GOAL #3   Title Patient will be able to ambulate at least 350 feet in 2MWT with least restrictive assistive device in order to demonstrate improved gait speed for community ambulation.    Baseline 250 feet with RW    Time 6    Period Weeks    Status On-going      PT LONG TERM GOAL #4   Title Patient will be able to ambulate at least 100 feet with cane safely    Time 5    Period Weeks    Status New    Target Date 08/30/20                   Plan - 08/05/20 1607     Clinical Impression Statement Focused on Strength of Lt LE today.  Worked on end range quad extension in supine via SAQ and seated via LAQ. Pt with tendency to hyperextend knee and utilize hip mm rather than quad with functional activites, i.e step ups.   Worked on slow controlled forward and lateral step  ups bending knee and utilizing quad mm rather than hip mm.  Pt able to complete this with cues and use of UE's for stability.  Pt continues to rely on walker due to Lt LE "giving way".    Personal Factors and Comorbidities  Age;Fitness;Behavior Pattern;Past/Current Experience;Comorbidity 3+;Time since onset of injury/illness/exacerbation    Comorbidities anxiety, DM, HTN    Examination-Activity Limitations Locomotion Level;Transfers;Squat;Stairs;Stand;Lift;Bend    Examination-Participation Restrictions Cleaning;Occupation;Meal Prep;Church;Community Activity;Shop;Volunteer;Yard Work    Stability/Clinical Decision Making Stable/Uncomplicated    Rehab Potential Good    PT Frequency 2x / week    PT Duration --   5 weeks   PT Treatment/Interventions ADLs/Self Care Home Management;Aquatic Therapy;Cryotherapy;Electrical Stimulation;Iontophoresis 4mg /ml Dexamethasone;Moist Heat;Traction;Ultrasound;DME Instruction;Gait training;Stair training;Functional mobility training;Therapeutic activities;Therapeutic exercise;Balance training;Neuromuscular re-education;Patient/family education;Manual techniques;Manual lymph drainage;Compression bandaging;Dry needling;Energy conservation;Spinal Manipulations;Joint Manipulations;Splinting;Taping;Passive range of motion    PT Next Visit Plan closed kinetic chain strengtheing, guard knee, trial lumbar mobilizations, focus on quad strength (watch for adductor compensations), knee extension, weight shifts, gait, balance and knee flexion    PT Home Exercise Plan HH HEP, hamstring  stretch, HR, mini squat at counter; 6/2: Quad set, SAQ, SLR all direction, bridge every day; LAQ with contral lateral assist    Consulted and Agree with Plan of Care Patient             Patient will benefit from skilled therapeutic intervention in order to improve the following deficits and impairments:  Abnormal gait, Decreased endurance, Decreased activity tolerance, Pain, Decreased balance, Impaired flexibility, Improper body mechanics, Decreased strength, Decreased mobility, Difficulty walking, Decreased scar mobility, Decreased skin integrity  Visit Diagnosis: Muscle weakness (generalized)  Other  abnormalities of gait and mobility  History of falling     Problem List Patient Active Problem List   Diagnosis Date Noted   Chronic pain of left knee 05/31/2020   Wound dehiscence, surgical    Status post total left knee replacement 05/28/2020   Primary osteoarthritis of left knee    Polyneuropathy due to secondary diabetes mellitus ([place]) 05/25/2019   Arthritis of right sacroiliac joint 05/25/2019   Cervical disc disorder 05/25/2019   Falls 05/25/2019   General unsteadiness 05/25/2019   Encounter for well woman exam with routine gynecological exam 01/09/2019   Screening for colorectal cancer 01/09/2019   Encounter for screening colonoscopy 12/13/2017   Hyperparathyroidism, primary ([place]) 12/22/2016   Vitamin D deficiency 10/31/2015   Hyperuricemia 05/08/2013   ONYCHOMYCOSIS, TOENAILS 02/22/2009   NEVI, MULTIPLE 02/22/2009   Diabetes mellitus ([place]) 01/25/2008   BACK PAIN WITH RADICULOPATHY 11/09/2007   Hyperlipidemia LDL goal <100 08/26/2007   Morbid obesity ([place]) 08/26/2007   Essential hypertension 08/26/2007   Obstructive sleep apnea 04/20/2007   Teena Irani, PTA/CLT [phone]  Teena Irani 08/05/2020, 4:08 PM  [place], [name], [address] Phone: [phone]   Fax:  [phone]  Name: Pamela Stein MRN: [id number] Date of Birth: 05-21-1955

## 2020-08-07 ENCOUNTER — Other Ambulatory Visit: Payer: Self-pay

## 2020-08-07 ENCOUNTER — Ambulatory Visit (HOSPITAL_COMMUNITY): Payer: Federal, State, Local not specified - PPO | Admitting: Physical Therapy

## 2020-08-07 DIAGNOSIS — R2689 Other abnormalities of gait and mobility: Secondary | ICD-10-CM

## 2020-08-07 DIAGNOSIS — Z9181 History of falling: Secondary | ICD-10-CM | POA: Diagnosis not present

## 2020-08-07 DIAGNOSIS — M6281 Muscle weakness (generalized): Secondary | ICD-10-CM

## 2020-08-07 NOTE — Therapy (Signed)
[place], [name], [address] Phone: [phone]   Fax:  [phone]  Physical Therapy Treatment  Patient Details  Name: Pamela Stein MRN: [id number] Date of Birth: 16-Jul-1955 Referring Provider (PT): Arther Abbott   Encounter Date: 08/07/2020   PT End of Session - 08/07/20 1146     Visit Number 19    Number of Visits 25    Date for PT Re-Evaluation 08/30/20    Authorization Type BCBS (no auth, VL [id number] used)    Authorization - Visit Number 19    Authorization - Number of Visits 42    Progress Note Due on Visit 25    PT Start Time 4401    PT Stop Time 1130    PT Time Calculation (min) 42 min    Activity Tolerance Patient tolerated treatment well;Patient limited by fatigue;No increased pain    Behavior During Therapy WFL for tasks assessed/performed             Past Medical History:  Diagnosis Date   Anxiety    Back pain    Bronchitis    Diabetes mellitus    Dizziness    Heart murmur    History of gout    Hyperlipidemia    Hypertension    Neuropathy    Obesity    Obstructive sleep apnea    CPAP    Past Surgical History:  Procedure Laterality Date   ABDOMINAL HYSTERECTOMY     ADENOIDECTOMY     CATARACT EXTRACTION Left    right 02/2018   COLONOSCOPY  2008   diminutive rectal polyp, s/p removal. polypoid mucosa   COLONOSCOPY WITH PROPOFOL N/A 01/03/2018   Procedure: COLONOSCOPY WITH PROPOFOL;  Surgeon: Daneil Dolin, MD;  [facility];  Service: Endoscopy;  Laterality: N/A;  12:00pm   DILATION AND CURETTAGE OF UTERUS     ENDOMETRIAL ABLATION     EYE SURGERY Bilateral 12/04/2013   cataract   PARATHYROIDECTOMY N/A 12/22/2016   Procedure: PARATHYROIDECTOMY, NECK EXPLORATION;  Surgeon: Jackolyn Confer, MD;  [facility];  Service: General;  Laterality: N/A;   TONSILLECTOMY     TOTAL KNEE ARTHROPLASTY Left 05/28/2020   Procedure: LEFT TOTAL KNEE ARTHROPLASTY;   Surgeon: Carole Civil, MD;  Location: [facility];  Service: Orthopedics;  Laterality: Left;   WOUND EXPLORATION Left 05/31/2020   Procedure: WOUND EXPLORATION AND IRRIGATION LEFT KNEE;  Surgeon: Carole Civil, MD;  Location: [facility];  Service: Orthopedics;  Laterality: Left;  irrigation wound exploration, wound closure    There were no vitals filed for this visit.                      [facility] Adult PT Treatment/Exercise - 08/07/20 0001       Ambulation/Gait   Ambulation/Gait Yes    Ambulation Distance (Feet) 250 Feet    Assistive device Straight cane      Knee/Hip Exercises: Standing   Heel Raises Both;1 set;20 reps    Heel Raises Limitations TR x 20 incline slope    Forward Lunges Left;10 reps;3 sets    Forward Lunges Limitations onto 4" step no UE's working on stability    Lateral Step Up Left;2 sets;10 reps;Hand Hold: 2    Lateral Step Up Limitations slow and controlled isolating quad    Forward Step Up 2 sets;10 reps;Step Height: 4"    Forward Step Up Limitations slow and controlled isolating quad  Wall Squat 10 reps    Wall Squat Limitations 5" holds, cues to keep weigtht even      Knee/Hip Exercises: Seated   Long Arc Quad Left;Strengthening;2 sets;10 reps    Long Arc Quad Limitations 2 sets with contralateral assist for end range and 5" holds - slow eccentric lower                      PT Short Term Goals - 07/15/20 1010       PT SHORT TERM GOAL #1   Title Patient will report at least 50% improvement in overall symptoms and/or function to demonstrate improved functional mobility    Time 3    Period Weeks    Status Achieved    Target Date 07/15/20      PT SHORT TERM GOAL #2   Title Patient will demonstrate 0-120 degrees of left knee ROM    Time 3    Period Weeks    Status On-going    Target Date 07/15/20      PT SHORT TERM GOAL #3   Title Patient will be independent in self management strategies to improve quality of life  and functional outcomes.    Time 3    Period Weeks    Status On-going    Target Date 07/15/20               PT Long Term Goals - 07/26/20 1018       PT LONG TERM GOAL #1   Title Patient will report at least 75% improvement in overall symptoms and/or function to demonstrate improved functional mobility    Time 6    Period Weeks    Status Achieved      PT LONG TERM GOAL #2   Title Patient will improve on FOTO score to meet predicted outcomes to demonstrate improved functional mobility.    Time 6    Period Weeks    Status Achieved      PT LONG TERM GOAL #3   Title Patient will be able to ambulate at least 350 feet in 2MWT with least restrictive assistive device in order to demonstrate improved gait speed for community ambulation.    Baseline 250 feet with RW    Time 6    Period Weeks    Status On-going      PT LONG TERM GOAL #4   Title Patient will be able to ambulate at least 100 feet with cane safely    Time 5    Period Weeks    Status New    Target Date 08/30/20                   Plan - 08/07/20 1149     Clinical Impression Statement Began session with gait training using SPC.  Pt with good cadence and sequencing using SPC with only min cues for step length.  No LOB or instrabilities noted and reported she felt confident using cane.  Continued with strenghtening exercises utilizing 4" step and cues to complete slowly with controlled motion.  Wallslides completed with cues to shift weigtht onto Lt LE as with tendency to put all weight through Rt LE. Instructed to continue current HEP and focus on Lt quad strengthening.    Personal Factors and Comorbidities Age;Fitness;Behavior Pattern;Past/Current Experience;Comorbidity 3+;Time since onset of injury/illness/exacerbation    Comorbidities anxiety, DM, HTN    Examination-Activity Limitations Locomotion Level;Transfers;Squat;Stairs;Stand;Lift;Bend    Examination-Participation Restrictions Cleaning;Occupation;Meal  Prep;Church;Community Activity;Shop;Volunteer;[employer]  Work    Stability/Clinical Decision Making Stable/Uncomplicated    Rehab Potential Good    PT Frequency 2x / week    PT Duration --   5 weeks   PT Treatment/Interventions ADLs/Self Care Home Management;Aquatic Therapy;Cryotherapy;Electrical Stimulation;Iontophoresis 4mg /ml Dexamethasone;Moist Heat;Traction;Ultrasound;DME Instruction;Gait training;Stair training;Functional mobility training;Therapeutic activities;Therapeutic exercise;Balance training;Neuromuscular re-education;Patient/family education;Manual techniques;Manual lymph drainage;Compression bandaging;Dry needling;Energy conservation;Spinal Manipulations;Joint Manipulations;Splinting;Taping;Passive range of motion    PT Next Visit Plan continue with closed kinetic chain strengtheing and independence with gait and basic mobility.    PT Home Exercise Plan HH HEP, hamstring  stretch, HR, mini squat at counter; 6/2: Quad set, SAQ, SLR all direction, bridge every day; LAQ with contral lateral assist    Consulted and Agree with Plan of Care Patient             Patient will benefit from skilled therapeutic intervention in order to improve the following deficits and impairments:  Abnormal gait, Decreased endurance, Decreased activity tolerance, Pain, Decreased balance, Impaired flexibility, Improper body mechanics, Decreased strength, Decreased mobility, Difficulty walking, Decreased scar mobility, Decreased skin integrity  Visit Diagnosis: Muscle weakness (generalized)  Other abnormalities of gait and mobility  History of falling     Problem List Patient Active Problem List   Diagnosis Date Noted   Chronic pain of left knee 05/31/2020   Wound dehiscence, surgical    Status post total left knee replacement 05/28/2020   Primary osteoarthritis of left knee    Polyneuropathy due to secondary diabetes mellitus ([place]) 05/25/2019   Arthritis of right sacroiliac joint 05/25/2019    Cervical disc disorder 05/25/2019   Falls 05/25/2019   General unsteadiness 05/25/2019   Encounter for well woman exam with routine gynecological exam 01/09/2019   Screening for colorectal cancer 01/09/2019   Encounter for screening colonoscopy 12/13/2017   Hyperparathyroidism, primary ([place]) 12/22/2016   Vitamin D deficiency 10/31/2015   Hyperuricemia 05/08/2013   ONYCHOMYCOSIS, TOENAILS 02/22/2009   NEVI, MULTIPLE 02/22/2009   Diabetes mellitus ([place]) 01/25/2008   BACK PAIN WITH RADICULOPATHY 11/09/2007   Hyperlipidemia LDL goal <100 08/26/2007   Morbid obesity ([place]) 08/26/2007   Essential hypertension 08/26/2007   Obstructive sleep apnea 04/20/2007   Teena Irani, PTA/CLT [phone]  Teena Irani 08/07/2020, 12:01 PM  [place], [name], [address] Phone: [phone]   Fax:  [phone]  Name: RAJANEE SCHUELKE MRN: [id number] Date of Birth: 06-17-1955

## 2020-08-08 ENCOUNTER — Telehealth: Payer: Self-pay | Admitting: Orthopedic Surgery

## 2020-08-08 ENCOUNTER — Encounter: Payer: Self-pay | Admitting: Orthopedic Surgery

## 2020-08-08 ENCOUNTER — Ambulatory Visit: Payer: Federal, State, Local not specified - PPO

## 2020-08-08 ENCOUNTER — Ambulatory Visit (INDEPENDENT_AMBULATORY_CARE_PROVIDER_SITE_OTHER): Payer: Federal, State, Local not specified - PPO | Admitting: Orthopedic Surgery

## 2020-08-08 VITALS — Ht 62.5 in | Wt 231.0 lb

## 2020-08-08 DIAGNOSIS — M25562 Pain in left knee: Secondary | ICD-10-CM

## 2020-08-08 DIAGNOSIS — W19XXXA Unspecified fall, initial encounter: Secondary | ICD-10-CM

## 2020-08-08 NOTE — Progress Notes (Signed)
Chief Complaint  Patient presents with   Routine Post Op    S/p TKR 05/28/20 and I&D 05/31/20, pt fell 08/07/20.

## 2020-08-08 NOTE — Telephone Encounter (Signed)
She fell last night on the floor on her knees about 11:55 pm last night and she wanted to let you know about it and  nothing is broken.  Please call her back.

## 2020-08-08 NOTE — Progress Notes (Addendum)
Chief Complaint  Patient presents with   Routine Post Op    S/p TKR 05/28/20 and I&D 05/31/20, pt fell 08/07/20.    Pamela Stein fell in the hospital and split her wound opened and had to have surgery 3 days after initial index procedure on the left knee she developed quadriceps weakness and has been in therapy ever since  She had graduated to a cane but fell today and injured the left knee came in for evaluation  She is now on the walker  She is referred for physical therapy  Not having a lot of pain but we advised her to come in for x-ray  The exam reveals a small area of abrasion over the left knee she has a small flexion contracture and her flexion arc is from 5-125 with a stable knee  Her x-ray shows no implant malfunction  Recommend follow-up in 3 months she should continue strengthening exercises

## 2020-08-09 ENCOUNTER — Encounter (HOSPITAL_COMMUNITY): Payer: Federal, State, Local not specified - PPO | Admitting: Physical Therapy

## 2020-08-14 ENCOUNTER — Telehealth: Payer: Self-pay | Admitting: Orthopedic Surgery

## 2020-08-14 ENCOUNTER — Encounter: Payer: Federal, State, Local not specified - PPO | Admitting: Orthopedic Surgery

## 2020-08-14 ENCOUNTER — Ambulatory Visit (HOSPITAL_COMMUNITY): Payer: Federal, State, Local not specified - PPO | Attending: Orthopedic Surgery | Admitting: Physical Therapy

## 2020-08-14 ENCOUNTER — Other Ambulatory Visit: Payer: Self-pay

## 2020-08-14 DIAGNOSIS — Z9181 History of falling: Secondary | ICD-10-CM | POA: Diagnosis not present

## 2020-08-14 DIAGNOSIS — R2689 Other abnormalities of gait and mobility: Secondary | ICD-10-CM | POA: Diagnosis not present

## 2020-08-14 DIAGNOSIS — M6281 Muscle weakness (generalized): Secondary | ICD-10-CM | POA: Insufficient documentation

## 2020-08-14 NOTE — Telephone Encounter (Signed)
She is requesting Amy to call her back when she can.

## 2020-08-14 NOTE — Therapy (Signed)
[place], [name], [address] Phone: [phone]   Fax:  [phone]  Physical Therapy Treatment  Patient Details  Name: Pamela Stein MRN: [id number] Date of Birth: [birth year] Referring Provider (PT): Arther Abbott   Encounter Date: 08/14/2020   PT End of Session - 08/14/20 1530     Visit Number 20    Number of Visits 25    Date for PT Re-Evaluation 08/30/20    Authorization Type BCBS (no auth, [id number])    Authorization - Visit Number 20    Authorization - Number of Visits 42    Progress Note Due on Visit 25    PT Start Time 1455    PT Stop Time 1535    PT Time Calculation (min) 40 min    Activity Tolerance Patient tolerated treatment well;Patient limited by fatigue;No increased pain    Behavior During Therapy WFL for tasks assessed/performed             Past Medical History:  Diagnosis Date   Anxiety    Back pain    Bronchitis    Diabetes mellitus    Dizziness    Heart murmur    History of gout    Hyperlipidemia    Hypertension    Neuropathy    Obesity    Obstructive sleep apnea    CPAP    Past Surgical History:  Procedure Laterality Date   ABDOMINAL HYSTERECTOMY     ADENOIDECTOMY     CATARACT EXTRACTION Left    right 02/2018   COLONOSCOPY  2008   diminutive rectal polyp, s/p removal. polypoid mucosa   COLONOSCOPY WITH PROPOFOL N/A 01/03/2018   Procedure: COLONOSCOPY WITH PROPOFOL;  Surgeon: Daneil Dolin, MD;  [facility];  Service: Endoscopy;  Laterality: N/A;  12:00pm   DILATION AND CURETTAGE OF UTERUS     ENDOMETRIAL ABLATION     EYE SURGERY Bilateral 12/04/2013   cataract   PARATHYROIDECTOMY N/A 12/22/2016   Procedure: PARATHYROIDECTOMY, NECK EXPLORATION;  Surgeon: Jackolyn Confer, MD;  Location: [facility];  Service: General;  Laterality: N/A;   TONSILLECTOMY     TOTAL KNEE ARTHROPLASTY Left 05/28/2020   Procedure: LEFT TOTAL KNEE ARTHROPLASTY;  Surgeon:  Carole Civil, MD;  Location: [facility];  Service: Orthopedics;  Laterality: Left;   WOUND EXPLORATION Left 05/31/2020   Procedure: WOUND EXPLORATION AND IRRIGATION LEFT KNEE;  Surgeon: Carole Civil, MD;  Location: [facility];  Service: Orthopedics;  Laterality: Left;  irrigation wound exploration, wound closure    There were no vitals filed for this visit.   Subjective Assessment - 08/14/20 1508     Subjective Pt states she fell in her home after standing from a chair Wednesday night.  States she had on her bedroom shoes and somehow advanced her Lt foot on top of her Rt foot and lost her balance as she was reaching for her walker.  States she called her MD the next morning just to make sure everything was okay.  today her Lt knee is tender but does not hurt.  States she has a little carpet burn area on her Lt knee as well.  STates she is eager to continue therapy and get her knees stronger.                               [facility] PT Treatment/Exercise - 08/14/20 0001  Knee/Hip Exercises: Standing   Heel Raises Both;1 set;20 reps    Forward Lunges Left;10 reps;3 sets    Forward Lunges Limitations onto 4" step no UE's working on stability    Lateral Step Up Left;2 sets;10 reps;Hand Hold: 2    Lateral Step Up Limitations slow and controlled isolating quad    Forward Step Up 2 sets;10 reps;Step Height: 4"    Forward Step Up Limitations slow and controlled isolating quad      Knee/Hip Exercises: Seated   Long Arc Quad Left;Strengthening;2 sets;10 reps    Long Arc Quad Limitations 2 sets with contralateral assist for end range and 5" holds - slow eccentric lower    Sit to [employer] 2 sets;5 reps;without UE support   with Lt further back towards body                     PT Short Term Goals - 07/15/20 1010       PT SHORT TERM GOAL #1   Title Patient will report at least 50% improvement in overall symptoms and/or function to demonstrate improved  functional mobility    Time 3    Period Weeks    Status Achieved    Target Date 07/15/20      PT SHORT TERM GOAL #2   Title Patient will demonstrate 0-120 degrees of left knee ROM    Time 3    Period Weeks    Status On-going    Target Date 07/15/20      PT SHORT TERM GOAL #3   Title Patient will be independent in self management strategies to improve quality of life and functional outcomes.    Time 3    Period Weeks    Status On-going    Target Date 07/15/20               PT Long Term Goals - 07/26/20 1018       PT LONG TERM GOAL #1   Title Patient will report at least 75% improvement in overall symptoms and/or function to demonstrate improved functional mobility    Time 6    Period Weeks    Status Achieved      PT LONG TERM GOAL #2   Title Patient will improve on FOTO score to meet predicted outcomes to demonstrate improved functional mobility.    Time 6    Period Weeks    Status Achieved      PT LONG TERM GOAL #3   Title Patient will be able to ambulate at least 350 feet in 2MWT with least restrictive assistive device in order to demonstrate improved gait speed for community ambulation.    Baseline 250 feet with RW    Time 6    Period Weeks    Status On-going      PT LONG TERM GOAL #4   Title Patient will be able to ambulate at least 100 feet with cane safely    Time 5    Period Weeks    Status New    Target Date 08/30/20                   Plan - 08/14/20 1536     Clinical Impression Statement Pt without pain or issues following an accidental trip at home after last visit.  Able to resume all exercises this session but noted improved extension with LAQ and ability tot complete standing with Lt LE further back and no UE assist.  Resumed repeated steps with eccentric focus.  Pt continues to use RW in community and [facility] at home at times.    Discussed causes of fall and feel her bedroom shoes may have been an issue as these are slip ons.  Recommended  either wearing socks with treads or investing in a new pair of bedroom shoes that have backs for safety.    Personal Factors and Comorbidities Age;Fitness;Behavior Pattern;Past/Current Experience;Comorbidity 3+;Time since onset of injury/illness/exacerbation    Comorbidities anxiety, DM, HTN    Examination-Activity Limitations Locomotion Level;Transfers;Squat;Stairs;Stand;Lift;Bend    Examination-Participation Restrictions Cleaning;Occupation;Meal Prep;Church;Community Activity;Shop;Volunteer;Yard Work    Stability/Clinical Decision Making Stable/Uncomplicated    Rehab Potential Good    PT Frequency 2x / week    PT Duration --   5 weeks   PT Treatment/Interventions ADLs/Self Care Home Management;Aquatic Therapy;Cryotherapy;Electrical Stimulation;Iontophoresis 4mg /ml Dexamethasone;Moist Heat;Traction;Ultrasound;DME Instruction;Gait training;Stair training;Functional mobility training;Therapeutic activities;Therapeutic exercise;Balance training;Neuromuscular re-education;Patient/family education;Manual techniques;Manual lymph drainage;Compression bandaging;Dry needling;Energy conservation;Spinal Manipulations;Joint Manipulations;Splinting;Taping;Passive range of motion    PT Next Visit Plan continue with closed kinetic chain strengtheing and independence with gait and basic mobility.  continue to progress to gait with [facility]    PT Home Exercise Plan HH HEP, hamstring  stretch, HR, mini squat at counter; 6/2: Quad set, SAQ, SLR all direction, bridge every day; LAQ with contral lateral assist    Consulted and Agree with Plan of Care Patient             Patient will benefit from skilled therapeutic intervention in order to improve the following deficits and impairments:  Abnormal gait, Decreased endurance, Decreased activity tolerance, Pain, Decreased balance, Impaired flexibility, Improper body mechanics, Decreased strength, Decreased mobility, Difficulty walking, Decreased scar mobility, Decreased skin  integrity  Visit Diagnosis: Muscle weakness (generalized)  Other abnormalities of gait and mobility  History of falling     Problem List Patient Active Problem List   Diagnosis Date Noted   Chronic pain of left knee 05/31/2020   Wound dehiscence, surgical    Status post total left knee replacement 05/28/2020   Primary osteoarthritis of left knee    Polyneuropathy due to secondary diabetes mellitus ([place]) 05/25/2019   Arthritis of right sacroiliac joint 05/25/2019   Cervical disc disorder 05/25/2019   Falls 05/25/2019   General unsteadiness 05/25/2019   Encounter for well woman exam with routine gynecological exam 01/09/2019   Screening for colorectal cancer 01/09/2019   Encounter for screening colonoscopy 12/13/2017   Hyperparathyroidism, primary ([place]) 12/22/2016   Vitamin D deficiency 10/31/2015   Hyperuricemia 05/08/2013   ONYCHOMYCOSIS, TOENAILS 02/22/2009   NEVI, MULTIPLE 02/22/2009   Diabetes mellitus ([place]) 01/25/2008   BACK PAIN WITH RADICULOPATHY 11/09/2007   Hyperlipidemia LDL goal <100 08/26/2007   Morbid obesity ([place]) 08/26/2007   Essential hypertension 08/26/2007   Obstructive sleep apnea 04/20/2007   Teena Irani, PTA/CLT [phone]  Teena Irani 08/14/2020, 3:37 PM  [place], [name], [address] Phone: [phone]   Fax:  [phone]  Name: Pamela Stein MRN: [id number] Date of Birth: 05-Mar-1955

## 2020-08-15 NOTE — Telephone Encounter (Signed)
Needs note out of work until 11/07/20  Provided, this was discussed at last visit  Also she said she has not refused therapy, and it was in her last office note she refused. She wants you to know she is going for therapy and has not refused, I told her will let you know.

## 2020-08-16 ENCOUNTER — Encounter (HOSPITAL_COMMUNITY): Payer: Self-pay | Admitting: Physical Therapy

## 2020-08-16 ENCOUNTER — Other Ambulatory Visit: Payer: Self-pay

## 2020-08-16 ENCOUNTER — Encounter: Payer: Self-pay | Admitting: Orthopedic Surgery

## 2020-08-16 ENCOUNTER — Ambulatory Visit (HOSPITAL_COMMUNITY): Payer: Federal, State, Local not specified - PPO | Admitting: Physical Therapy

## 2020-08-16 DIAGNOSIS — Z9181 History of falling: Secondary | ICD-10-CM

## 2020-08-16 DIAGNOSIS — M6281 Muscle weakness (generalized): Secondary | ICD-10-CM

## 2020-08-16 DIAGNOSIS — R2689 Other abnormalities of gait and mobility: Secondary | ICD-10-CM

## 2020-08-16 NOTE — Therapy (Signed)
[place], [name], [address] Phone: [phone]   Fax:  [phone]  Physical Therapy Treatment  Patient Details  Name: Pamela Stein MRN: [id number] Date of Birth: Dec 30, 1955 Referring Provider (PT): Arther Abbott   Encounter Date: 08/16/2020   PT End of Session - 08/16/20 0836     Visit Number 21    Number of Visits 25    Date for PT Re-Evaluation 08/30/20    Authorization Type BCBS (no auth, VL [id number] used)    Authorization - Visit Number 21    Authorization - Number of Visits 42    Progress Note Due on Visit 25    PT Start Time [phone]   late to checkin   PT Stop Time 0913    PT Time Calculation (min) 38 min    Activity Tolerance Patient tolerated treatment well;Patient limited by fatigue;No increased pain    Behavior During Therapy WFL for tasks assessed/performed             Past Medical History:  Diagnosis Date   Anxiety    Back pain    Bronchitis    Diabetes mellitus    Dizziness    Heart murmur    History of gout    Hyperlipidemia    Hypertension    Neuropathy    Obesity    Obstructive sleep apnea    CPAP    Past Surgical History:  Procedure Laterality Date   ABDOMINAL HYSTERECTOMY     ADENOIDECTOMY     CATARACT EXTRACTION Left    right 02/2018   COLONOSCOPY  2008   diminutive rectal polyp, s/p removal. polypoid mucosa   COLONOSCOPY WITH PROPOFOL N/A 01/03/2018   Procedure: COLONOSCOPY WITH PROPOFOL;  Surgeon: Daneil Dolin, MD;  [facility];  Service: Endoscopy;  Laterality: N/A;  12:00pm   DILATION AND CURETTAGE OF UTERUS     ENDOMETRIAL ABLATION     EYE SURGERY Bilateral 12/04/2013   cataract   PARATHYROIDECTOMY N/A 12/22/2016   Procedure: PARATHYROIDECTOMY, NECK EXPLORATION;  Surgeon: Jackolyn Confer, MD;  [facility]: [facility];  Service: General;  Laterality: N/A;   TONSILLECTOMY     TOTAL KNEE ARTHROPLASTY Left 05/28/2020   Procedure: LEFT TOTAL KNEE  ARTHROPLASTY;  Surgeon: Carole Civil, MD;  Location: [facility];  Service: Orthopedics;  Laterality: Left;   WOUND EXPLORATION Left 05/31/2020   Procedure: WOUND EXPLORATION AND IRRIGATION LEFT KNEE;  Surgeon: Carole Civil, MD;  Location: [facility];  Service: Orthopedics;  Laterality: Left;  irrigation wound exploration, wound closure    There were no vitals filed for this visit.   Subjective Assessment - 08/16/20 0839     Subjective States she has been trying to do her exercises but its not like it was prior to her recent fall. States she has about 1/10 dullness of pain in her knee.                [facility] PT Assessment - 08/16/20 0001       Assessment   Medical Diagnosis L TKA    Referring Provider (PT) Arther Abbott    Onset Date/Surgical Date 05/28/20    Next MD Visit 08/14/20                           [facility] Adult PT Treatment/Exercise - 08/16/20 0001       Ambulation/Gait   Ambulation/Gait Yes  Gait Comments in // bars with 1 hadn assist then no hand assist both forwards and backwards walking focu on taking large steps- then with cane on open ground - 5 minutes      Knee/Hip Exercises: Standing   Lateral Step Up Left;2 sets;10 reps;Hand Hold: 2;Step Height: 4"    Lateral Step Up Limitations slow and controlled isolating quad    Forward Step Up 20 reps;Hand Hold: 2;Step Height: 4"   cues to bend knee both forward step ups and downs - 2 sess   Forward Step Up Limitations slow and controlled isolating quad    Rocker Board 2 minutes   x3 sets - slow and controlled - tactile cues for controlled trunk motion - UE assist for balance     Knee/Hip Exercises: Seated   Sit to Sand 2 sets;10 reps;without UE support   slow and controlled with yellow weighted ball -                     PT Short Term Goals - 07/15/20 1010       PT SHORT TERM GOAL #1   Title Patient will report at least 50% improvement in overall symptoms and/or function to  demonstrate improved functional mobility    Time 3    Period Weeks    Status Achieved    Target Date 07/15/20      PT SHORT TERM GOAL #2   Title Patient will demonstrate 0-120 degrees of left knee ROM    Time 3    Period Weeks    Status On-going    Target Date 07/15/20      PT SHORT TERM GOAL #3   Title Patient will be independent in self management strategies to improve quality of life and functional outcomes.    Time 3    Period Weeks    Status On-going    Target Date 07/15/20               PT Long Term Goals - 07/26/20 1018       PT LONG TERM GOAL #1   Title Patient will report at least 75% improvement in overall symptoms and/or function to demonstrate improved functional mobility    Time 6    Period Weeks    Status Achieved      PT LONG TERM GOAL #2   Title Patient will improve on FOTO score to meet predicted outcomes to demonstrate improved functional mobility.    Time 6    Period Weeks    Status Achieved      PT LONG TERM GOAL #3   Title Patient will be able to ambulate at least 350 feet in 2MWT with least restrictive assistive device in order to demonstrate improved gait speed for community ambulation.    Baseline 250 feet with RW    Time 6    Period Weeks    Status On-going      PT LONG TERM GOAL #4   Title Patient will be able to ambulate at least 100 feet with cane safely    Time 5    Period Weeks    Status New    Target Date 08/30/20                   Plan - 08/16/20 0909     Clinical Impression Statement Patient tolerated gait training well today. Able to take large controlled steps with  no upper extremity support in parallel bars. Fatigue  noted. Able to practice walking with cane without lose of balance and with good form in open gym. Educated patient in practicing walking in hallway with cane at home at least 1 minute at a time for 15 reps at day to improve overall strength and confidence with walking with a cane. Instructed patient  to not wear bedroom shoes and to wear regular shoes while practicing. Will follow up with use of cane next session.    Personal Factors and Comorbidities Age;Fitness;Behavior Pattern;Past/Current Experience;Comorbidity 3+;Time since onset of injury/illness/exacerbation    Comorbidities anxiety, DM, HTN    Examination-Activity Limitations Locomotion Level;Transfers;Squat;Stairs;Stand;Lift;Bend    Examination-Participation Restrictions Cleaning;Occupation;Meal Prep;Church;Community Activity;Shop;Volunteer;Yard Work    Stability/Clinical Decision Making Stable/Uncomplicated    Rehab Potential Good    PT Frequency 2x / week    PT Duration --   5 weeks   PT Treatment/Interventions ADLs/Self Care Home Management;Aquatic Therapy;Cryotherapy;Electrical Stimulation;Iontophoresis 4mg /ml Dexamethasone;Moist Heat;Traction;Ultrasound;DME Instruction;Gait training;Stair training;Functional mobility training;Therapeutic activities;Therapeutic exercise;Balance training;Neuromuscular re-education;Patient/family education;Manual techniques;Manual lymph drainage;Compression bandaging;Dry needling;Energy conservation;Spinal Manipulations;Joint Manipulations;Splinting;Taping;Passive range of motion    PT Next Visit Plan continue with closed kinetic chain strengtheing and independence with gait and basic mobility.  continue to progress to gait with [facility]    PT Home Exercise Plan HH HEP, hamstring  stretch, HR, mini squat at counter; 6/2: Quad set, SAQ, SLR all direction, bridge every day; LAQ with contral lateral assist; walking with cane    Consulted and Agree with Plan of Care Patient             Patient will benefit from skilled therapeutic intervention in order to improve the following deficits and impairments:  Abnormal gait, Decreased endurance, Decreased activity tolerance, Pain, Decreased balance, Impaired flexibility, Improper body mechanics, Decreased strength, Decreased mobility, Difficulty walking, Decreased  scar mobility, Decreased skin integrity  Visit Diagnosis: Muscle weakness (generalized)  Other abnormalities of gait and mobility  History of falling     Problem List Patient Active Problem List   Diagnosis Date Noted   Chronic pain of left knee 05/31/2020   Wound dehiscence, surgical    Status post total left knee replacement 05/28/2020   Primary osteoarthritis of left knee    Polyneuropathy due to secondary diabetes mellitus ([place]) 05/25/2019   Arthritis of right sacroiliac joint 05/25/2019   Cervical disc disorder 05/25/2019   Falls 05/25/2019   General unsteadiness 05/25/2019   Encounter for well woman exam with routine gynecological exam 01/09/2019   Screening for colorectal cancer 01/09/2019   Encounter for screening colonoscopy 12/13/2017   Hyperparathyroidism, primary ([place]) 12/22/2016   Vitamin D deficiency 10/31/2015   Hyperuricemia 05/08/2013   ONYCHOMYCOSIS, TOENAILS 02/22/2009   NEVI, MULTIPLE 02/22/2009   Diabetes mellitus ([place]) 01/25/2008   BACK PAIN WITH RADICULOPATHY 11/09/2007   Hyperlipidemia LDL goal <100 08/26/2007   Morbid obesity ([place]) 08/26/2007   Essential hypertension 08/26/2007   Obstructive sleep apnea 04/20/2007   9:13 AM, 08/16/20 Jerene Pitch, DPT Physical Therapy with [facility]  [phone] office   [place], [name], [address] Phone: [phone]   Fax:  [phone]  Name: Pamela Stein MRN: [id number] Date of Birth: [birth year]

## 2020-08-21 ENCOUNTER — Telehealth: Payer: Self-pay | Admitting: Orthopedic Surgery

## 2020-08-21 ENCOUNTER — Other Ambulatory Visit: Payer: Self-pay

## 2020-08-21 ENCOUNTER — Encounter (HOSPITAL_COMMUNITY): Payer: Self-pay

## 2020-08-21 ENCOUNTER — Ambulatory Visit (HOSPITAL_COMMUNITY): Payer: Federal, State, Local not specified - PPO

## 2020-08-21 ENCOUNTER — Encounter: Payer: Self-pay | Admitting: Orthopedic Surgery

## 2020-08-21 DIAGNOSIS — M6281 Muscle weakness (generalized): Secondary | ICD-10-CM | POA: Diagnosis not present

## 2020-08-21 DIAGNOSIS — Z9181 History of falling: Secondary | ICD-10-CM | POA: Diagnosis not present

## 2020-08-21 DIAGNOSIS — R2689 Other abnormalities of gait and mobility: Secondary | ICD-10-CM | POA: Diagnosis not present

## 2020-08-21 NOTE — Telephone Encounter (Signed)
Yes, that is appropriate. Thanks

## 2020-08-21 NOTE — Therapy (Signed)
[place], [name], [address] Phone: [phone]   Fax:  [phone]  Physical Therapy Treatment  Patient Details  Name: Pamela Stein MRN: [id number] Date of Birth: [birth year] Referring Provider (PT): Arther Abbott   Encounter Date: 08/21/2020   PT End of Session - 08/21/20 0839     Visit Number 22    Number of Visits 25    Date for PT Re-Evaluation 08/30/20    Authorization Type BCBS (no auth, [id number])    Authorization - Visit Number 74    Authorization - Number of Visits 42    Progress Note Due on Visit 25    PT Start Time 0835    PT Stop Time 0915    PT Time Calculation (min) 40 min    Activity Tolerance Patient tolerated treatment well;Patient limited by fatigue;No increased pain    Behavior During Therapy WFL for tasks assessed/performed             Past Medical History:  Diagnosis Date   Anxiety    Back pain    Bronchitis    Diabetes mellitus    Dizziness    Heart murmur    History of gout    Hyperlipidemia    Hypertension    Neuropathy    Obesity    Obstructive sleep apnea    CPAP    Past Surgical History:  Procedure Laterality Date   ABDOMINAL HYSTERECTOMY     ADENOIDECTOMY     CATARACT EXTRACTION Left    right 02/2018   COLONOSCOPY  2008   diminutive rectal polyp, s/p removal. polypoid mucosa   COLONOSCOPY WITH PROPOFOL N/A 01/03/2018   Procedure: COLONOSCOPY WITH PROPOFOL;  Surgeon: Daneil Dolin, MD;  [facility];  Service: Endoscopy;  Laterality: N/A;  12:00pm   DILATION AND CURETTAGE OF UTERUS     ENDOMETRIAL ABLATION     EYE SURGERY Bilateral 12/04/2013   cataract   PARATHYROIDECTOMY N/A 12/22/2016   Procedure: PARATHYROIDECTOMY, NECK EXPLORATION;  Surgeon: Jackolyn Confer, MD;  Location: [facility];  Service: General;  Laterality: N/A;   TONSILLECTOMY     TOTAL KNEE ARTHROPLASTY Left 05/28/2020   Procedure: LEFT TOTAL KNEE ARTHROPLASTY;   Surgeon: Carole Civil, MD;  Location: [facility];  Service: Orthopedics;  Laterality: Left;   WOUND EXPLORATION Left 05/31/2020   Procedure: WOUND EXPLORATION AND IRRIGATION LEFT KNEE;  Surgeon: Carole Civil, MD;  Location: [facility];  Service: Orthopedics;  Laterality: Left;  irrigation wound exploration, wound closure    There were no vitals filed for this visit.   Subjective Assessment - 08/21/20 0838     Subjective States she has been practicing use of cane in hallway at home.  No reoprts of recent falls.  No reports of pain today.    Pertinent History HTN, DB    Patient Stated Goals to be able to get her left leg stronger so she doesn't have to use the walker.    Currently in Pain? No/denies                               [facility] Adult PT Treatment/Exercise - 08/21/20 0001       Ambulation/Gait   Ambulation/Gait Yes    Ambulation Distance (Feet) 250 Feet    Assistive device Straight cane    Gait Comments in // bars with no HHA  Knee/Hip Exercises: Standing   Heel Raises Both;1 set;20 reps    Lateral Step Up Left;2 sets;10 reps;Hand Hold: 2;Step Height: 4"    Lateral Step Up Limitations slow and controlled isolating quad    Forward Step Up 20 reps;Hand Hold: 2;Step Height: 4"   cueing for knee flexion forward step up   Forward Step Up Limitations slow and controlled isolating quad    Step Down Left;Hand Hold: 2;Step Height: 4";2 sets;10 reps    SLS 3x 30" wtih 1 finger; cueing to reduce knee locking    SLS with Vectors 3x5" with HHA    Gait Training SPC 250 ft                      PT Short Term Goals - 07/15/20 1010       PT SHORT TERM GOAL #1   Title Patient will report at least 50% improvement in overall symptoms and/or function to demonstrate improved functional mobility    Time 3    Period Weeks    Status Achieved    Target Date 07/15/20      PT SHORT TERM GOAL #2   Title Patient will demonstrate 0-120 degrees of left  knee ROM    Time 3    Period Weeks    Status On-going    Target Date 07/15/20      PT SHORT TERM GOAL #3   Title Patient will be independent in self management strategies to improve quality of life and functional outcomes.    Time 3    Period Weeks    Status On-going    Target Date 07/15/20               PT Long Term Goals - 07/26/20 1018       PT LONG TERM GOAL #1   Title Patient will report at least 75% improvement in overall symptoms and/or function to demonstrate improved functional mobility    Time 6    Period Weeks    Status Achieved      PT LONG TERM GOAL #2   Title Patient will improve on FOTO score to meet predicted outcomes to demonstrate improved functional mobility.    Time 6    Period Weeks    Status Achieved      PT LONG TERM GOAL #3   Title Patient will be able to ambulate at least 350 feet in 2MWT with least restrictive assistive device in order to demonstrate improved gait speed for community ambulation.    Baseline 250 feet with RW    Time 6    Period Weeks    Status On-going      PT LONG TERM GOAL #4   Title Patient will be able to ambulate at least 100 feet with cane safely    Time 5    Period Weeks    Status New    Target Date 08/30/20                   Plan - 08/21/20 0918     Clinical Impression Statement Pt able to demonstrate good sequence with SPC, no LOB though demonstrates slow cadence and 1 episode of knee buckling during gait training with min guard for safety.  Pt continues to present with quad and gluteal weakness.  Cueing to reduce locking knee during SLS based activities and to bend knee during step up training.  Pt was limited by fatigue with activities, required a couple  of seated rest breaks.    Personal Factors and Comorbidities Age;Fitness;Behavior Pattern;Past/Current Experience;Comorbidity 3+;Time since onset of injury/illness/exacerbation    Comorbidities anxiety, DM, HTN    Examination-Activity Limitations  Locomotion Level;Transfers;Squat;Stairs;Stand;Lift;Bend    Examination-Participation Restrictions Cleaning;Occupation;Meal Prep;Church;Community Activity;Shop;Volunteer;Yard Work    Stability/Clinical Decision Making Stable/Uncomplicated    Designer, jewellery Low    Rehab Potential Good    PT Frequency 2x / week    PT Duration --   5 weeks   PT Treatment/Interventions ADLs/Self Care Home Management;Aquatic Therapy;Cryotherapy;Electrical Stimulation;Iontophoresis 4mg /ml Dexamethasone;Moist Heat;Traction;Ultrasound;DME Instruction;Gait training;Stair training;Functional mobility training;Therapeutic activities;Therapeutic exercise;Balance training;Neuromuscular re-education;Patient/family education;Manual techniques;Manual lymph drainage;Compression bandaging;Dry needling;Energy conservation;Spinal Manipulations;Joint Manipulations;Splinting;Taping;Passive range of motion    PT Next Visit Plan continue with closed kinetic chain strengtheing and independence with gait and basic mobility.  continue to progress to gait with [facility]    PT Home Exercise Plan HH HEP, hamstring  stretch, HR, mini squat at counter; 6/2: Quad set, SAQ, SLR all direction, bridge every day; LAQ with contral lateral assist; walking with cane    Consulted and Agree with Plan of Care Patient             Patient will benefit from skilled therapeutic intervention in order to improve the following deficits and impairments:  Abnormal gait, Decreased endurance, Decreased activity tolerance, Pain, Decreased balance, Impaired flexibility, Improper body mechanics, Decreased strength, Decreased mobility, Difficulty walking, Decreased scar mobility, Decreased skin integrity  Visit Diagnosis: Muscle weakness (generalized)  Other abnormalities of gait and mobility  History of falling     Problem List Patient Active Problem List   Diagnosis Date Noted   Chronic pain of left knee 05/31/2020   Wound dehiscence, surgical     Status post total left knee replacement 05/28/2020   Primary osteoarthritis of left knee    Polyneuropathy due to secondary diabetes mellitus ([place]) 05/25/2019   Arthritis of right sacroiliac joint 05/25/2019   Cervical disc disorder 05/25/2019   Falls 05/25/2019   General unsteadiness 05/25/2019   Encounter for well woman exam with routine gynecological exam 01/09/2019   Screening for colorectal cancer 01/09/2019   Encounter for screening colonoscopy 12/13/2017   Hyperparathyroidism, primary ([place]) 12/22/2016   Vitamin D deficiency 10/31/2015   Hyperuricemia 05/08/2013   ONYCHOMYCOSIS, TOENAILS 02/22/2009   NEVI, MULTIPLE 02/22/2009   Diabetes mellitus ([place]) 01/25/2008   BACK PAIN WITH RADICULOPATHY 11/09/2007   Hyperlipidemia LDL goal <100 08/26/2007   Morbid obesity ([place]) 08/26/2007   Essential hypertension 08/26/2007   Obstructive sleep apnea 04/20/2007   Ihor Austin, LPTA/CLT; CBIS [phone]  Aldona Lento 08/21/2020, 9:27 AM  [place], [name], [address] Phone: [phone]   Fax:  [phone]  Name: Pamela Stein MRN: [id number] Date of Birth: 1955-03-12

## 2020-08-21 NOTE — Telephone Encounter (Signed)
Done. Note issued.

## 2020-08-21 NOTE — Telephone Encounter (Signed)
Done

## 2020-08-21 NOTE — Telephone Encounter (Signed)
Patient was called back on 08/08/20, and given appointment with Dr Aline Brochure. Appointment completed as scheduled 08/08/20.

## 2020-08-21 NOTE — Telephone Encounter (Signed)
Patient came to office relaying that her work note which we had updated to be effective through 11/07/20, estimated return to work 11/08/20; states employer now requests to include the work "incapacitated."  Please advise.

## 2020-08-23 ENCOUNTER — Encounter (HOSPITAL_COMMUNITY): Payer: Self-pay

## 2020-08-23 ENCOUNTER — Other Ambulatory Visit: Payer: Self-pay

## 2020-08-23 ENCOUNTER — Ambulatory Visit (HOSPITAL_COMMUNITY): Payer: Federal, State, Local not specified - PPO

## 2020-08-23 DIAGNOSIS — R2689 Other abnormalities of gait and mobility: Secondary | ICD-10-CM | POA: Diagnosis not present

## 2020-08-23 DIAGNOSIS — Z9181 History of falling: Secondary | ICD-10-CM | POA: Diagnosis not present

## 2020-08-23 DIAGNOSIS — M6281 Muscle weakness (generalized): Secondary | ICD-10-CM

## 2020-08-23 NOTE — Therapy (Signed)
[place], [name], [address] Phone: [phone]   Fax:  [phone]  Physical Therapy Treatment  Patient Details  Name: DESHAE DICKISON MRN: [id number] Date of Birth: 17-May-1955 Referring Provider (PT): Arther Abbott   Encounter Date: 08/23/2020   PT End of Session - 08/23/20 0847     Visit Number 23    Number of Visits 25    Date for PT Re-Evaluation 08/30/20    Authorization Type BCBS (no auth, VL [id number] used)    Authorization - Visit Number 23    Authorization - Number of Visits 42    Progress Note Due on Visit 25    PT Start Time [phone]   late arrival   PT Stop Time 0915    PT Time Calculation (min) 32 min    Activity Tolerance Patient tolerated treatment well;Patient limited by fatigue;No increased pain    Behavior During Therapy WFL for tasks assessed/performed             Past Medical History:  Diagnosis Date   Anxiety    Back pain    Bronchitis    Diabetes mellitus    Dizziness    Heart murmur    History of gout    Hyperlipidemia    Hypertension    Neuropathy    Obesity    Obstructive sleep apnea    CPAP    Past Surgical History:  Procedure Laterality Date   ABDOMINAL HYSTERECTOMY     ADENOIDECTOMY     CATARACT EXTRACTION Left    right 02/2018   COLONOSCOPY  2008   diminutive rectal polyp, s/p removal. polypoid mucosa   COLONOSCOPY WITH PROPOFOL N/A 01/03/2018   Procedure: COLONOSCOPY WITH PROPOFOL;  Surgeon: Daneil Dolin, MD;  [facility];  Service: Endoscopy;  Laterality: N/A;  12:00pm   DILATION AND CURETTAGE OF UTERUS     ENDOMETRIAL ABLATION     EYE SURGERY Bilateral 12/04/2013   cataract   PARATHYROIDECTOMY N/A 12/22/2016   Procedure: PARATHYROIDECTOMY, NECK EXPLORATION;  Surgeon: Jackolyn Confer, MD;  [facility];  Service: General;  Laterality: N/A;   TONSILLECTOMY     TOTAL KNEE ARTHROPLASTY Left 05/28/2020   Procedure: LEFT TOTAL KNEE  ARTHROPLASTY;  Surgeon: Carole Civil, MD;  Location: [facility];  Service: Orthopedics;  Laterality: Left;   WOUND EXPLORATION Left 05/31/2020   Procedure: WOUND EXPLORATION AND IRRIGATION LEFT KNEE;  Surgeon: Carole Civil, MD;  Location: [facility];  Service: Orthopedics;  Laterality: Left;  irrigation wound exploration, wound closure    There were no vitals filed for this visit.   Subjective Assessment - 08/23/20 0846     Subjective Pt stated she overslept this morning.  Pt reports her job is requiring additional paperwork and needs a note every 30 days for RTW.    Pertinent History HTN, DB    Patient Stated Goals to be able to get her left leg stronger so she doesn't have to use the walker.    Currently in Pain? No/denies                [facility] PT Assessment - 08/23/20 0001       Assessment   Medical Diagnosis L TKA    Referring Provider (PT) Arther Abbott    Onset Date/Surgical Date 05/28/20    Next MD Visit 9/29      Precautions   Precautions Fall  [place] Adult PT Treatment/Exercise - 08/23/20 0001       Ambulation/Gait   Ambulation/Gait Yes    Ambulation Distance (Feet) --   through session   Assistive device Straight cane    Gait Comments through session      Knee/Hip Exercises: Standing   Heel Raises Both;1 set;20 reps    Lateral Step Up Left;2 sets;10 reps;Hand Hold: 2;Step Height: 4"    Lateral Step Up Limitations slow and controlled isolating quad    Forward Step Up 2 sets;10 reps;Hand Hold: 1;Step Height: 4"    Forward Step Up Limitations slow and controlled isolating quad    Wall Squat 10 reps    Wall Squat Limitations 5" holds, cues to keep weigtht even                      PT Short Term Goals - 07/15/20 1010       PT SHORT TERM GOAL #1   Title Patient will report at least 50% improvement in overall symptoms and/or function to demonstrate improved functional mobility    Time 3     Period Weeks    Status Achieved    Target Date 07/15/20      PT SHORT TERM GOAL #2   Title Patient will demonstrate 0-120 degrees of left knee ROM    Time 3    Period Weeks    Status On-going    Target Date 07/15/20      PT SHORT TERM GOAL #3   Title Patient will be independent in self management strategies to improve quality of life and functional outcomes.    Time 3    Period Weeks    Status On-going    Target Date 07/15/20               PT Long Term Goals - 07/26/20 1018       PT LONG TERM GOAL #1   Title Patient will report at least 75% improvement in overall symptoms and/or function to demonstrate improved functional mobility    Time 6    Period Weeks    Status Achieved      PT LONG TERM GOAL #2   Title Patient will improve on FOTO score to meet predicted outcomes to demonstrate improved functional mobility.    Time 6    Period Weeks    Status Achieved      PT LONG TERM GOAL #3   Title Patient will be able to ambulate at least 350 feet in 2MWT with least restrictive assistive device in order to demonstrate improved gait speed for community ambulation.    Baseline 250 feet with RW    Time 6    Period Weeks    Status On-going      PT LONG TERM GOAL #4   Title Patient will be able to ambulate at least 100 feet with cane safely    Time 5    Period Weeks    Status New    Target Date 08/30/20                   Plan - 08/23/20 0854     Clinical Impression Statement Session focus on isolation quadricep and gluteal strengthening.  Pt presents with improved mechanics during step up training, less cueing required for mechanics and to reduce locking knee.  Continues to show quad and gluteal weakness noted by increased UE support required with 4in step height.  No reoprts of  pain through session, was limited by fatigue.    Personal Factors and Comorbidities Age;Fitness;Behavior Pattern;Past/Current Experience;Comorbidity 3+;Time since onset of  injury/illness/exacerbation    Comorbidities anxiety, DM, HTN    Examination-Activity Limitations Locomotion Level;Transfers;Squat;Stairs;Stand;Lift;Bend    Examination-Participation Restrictions Cleaning;Occupation;Meal Prep;Church;Community Activity;Shop;Volunteer;Yard Work    Stability/Clinical Decision Making Stable/Uncomplicated    Designer, jewellery Low    Rehab Potential Good    PT Frequency 2x / week    PT Duration --   5 weeks   PT Treatment/Interventions ADLs/Self Care Home Management;Aquatic Therapy;Cryotherapy;Electrical Stimulation;Iontophoresis 4mg /ml Dexamethasone;Moist Heat;Traction;Ultrasound;DME Instruction;Gait training;Stair training;Functional mobility training;Therapeutic activities;Therapeutic exercise;Balance training;Neuromuscular re-education;Patient/family education;Manual techniques;Manual lymph drainage;Compression bandaging;Dry needling;Energy conservation;Spinal Manipulations;Joint Manipulations;Splinting;Taping;Passive range of motion    PT Next Visit Plan continue with closed kinetic chain strengtheing and independence with gait and basic mobility.  continue to progress to gait with [facility]    PT Home Exercise Plan HH HEP, hamstring  stretch, HR, mini squat at counter; 6/2: Quad set, SAQ, SLR all direction, bridge every day; LAQ with contral lateral assist; walking with cane    Consulted and Agree with Plan of Care Patient             Patient will benefit from skilled therapeutic intervention in order to improve the following deficits and impairments:  Abnormal gait, Decreased endurance, Decreased activity tolerance, Pain, Decreased balance, Impaired flexibility, Improper body mechanics, Decreased strength, Decreased mobility, Difficulty walking, Decreased scar mobility, Decreased skin integrity  Visit Diagnosis: Muscle weakness (generalized)  Other abnormalities of gait and mobility  History of falling     Problem List Patient Active Problem List    Diagnosis Date Noted   Chronic pain of left knee 05/31/2020   Wound dehiscence, surgical    Status post total left knee replacement 05/28/2020   Primary osteoarthritis of left knee    Polyneuropathy due to secondary diabetes mellitus ([place]) 05/25/2019   Arthritis of right sacroiliac joint 05/25/2019   Cervical disc disorder 05/25/2019   Falls 05/25/2019   General unsteadiness 05/25/2019   Encounter for well woman exam with routine gynecological exam 01/09/2019   Screening for colorectal cancer 01/09/2019   Encounter for screening colonoscopy 12/13/2017   Hyperparathyroidism, primary ([place]) 12/22/2016   Vitamin D deficiency 10/31/2015   Hyperuricemia 05/08/2013   ONYCHOMYCOSIS, TOENAILS 02/22/2009   NEVI, MULTIPLE 02/22/2009   Diabetes mellitus ([place]) 01/25/2008   BACK PAIN WITH RADICULOPATHY 11/09/2007   Hyperlipidemia LDL goal <100 08/26/2007   Morbid obesity ([place]) 08/26/2007   Essential hypertension 08/26/2007   Obstructive sleep apnea 04/20/2007   Ihor Austin, LPTA/CLT; CBIS [phone]  Aldona Lento 08/23/2020, 5:31 PM  [place], [name], [address] Phone: [phone]   Fax:  [phone]  Name: RANEISHA BRESS MRN: [id number] Date of Birth: [birth year]

## 2020-08-28 ENCOUNTER — Ambulatory Visit (HOSPITAL_COMMUNITY): Payer: Federal, State, Local not specified - PPO

## 2020-08-28 ENCOUNTER — Other Ambulatory Visit: Payer: Self-pay

## 2020-08-28 DIAGNOSIS — M6281 Muscle weakness (generalized): Secondary | ICD-10-CM

## 2020-08-28 DIAGNOSIS — R2689 Other abnormalities of gait and mobility: Secondary | ICD-10-CM | POA: Diagnosis not present

## 2020-08-28 DIAGNOSIS — Z9181 History of falling: Secondary | ICD-10-CM

## 2020-08-28 NOTE — Therapy (Signed)
Pamela Stein, [name], [address] Phone: [phone]   Fax:  [phone]  Physical Therapy Treatment  Patient Details  Name: Pamela Stein MRN: [id number] Date of Birth: Apr 29, 1955 Referring Provider (PT): Pamela Stein   Encounter Date: 08/28/2020   PT End of Session - 08/28/20 0839     Visit Number 24    Number of Visits 25    Date for PT Re-Evaluation 08/30/20    Authorization Type BCBS (no auth, [id number])    Authorization - Visit Number 24    Authorization - Number of Visits 42    Progress Note Due on Visit 25    PT Start Time [phone]    PT Stop Time 0915    PT Time Calculation (min) 42 min    Activity Tolerance Patient tolerated treatment well;Patient limited by fatigue;No increased pain    Behavior During Therapy WFL for tasks assessed/performed             Past Medical History:  Diagnosis Date   Anxiety    Back pain    Bronchitis    Diabetes mellitus    Dizziness    Heart murmur    History of gout    Hyperlipidemia    Hypertension    Neuropathy    Obesity    Obstructive sleep apnea    CPAP    Past Surgical History:  Procedure Laterality Date   ABDOMINAL HYSTERECTOMY     ADENOIDECTOMY     CATARACT EXTRACTION Left    right 02/2018   COLONOSCOPY  2008   diminutive rectal polyp, s/p removal. polypoid mucosa   COLONOSCOPY WITH PROPOFOL N/A 01/03/2018   Procedure: COLONOSCOPY WITH PROPOFOL;  Surgeon: Daneil Dolin, MD;  [facility];  Service: Endoscopy;  Laterality: N/A;  12:00pm   DILATION AND CURETTAGE OF UTERUS     ENDOMETRIAL ABLATION     EYE SURGERY Bilateral 12/04/2013   cataract   PARATHYROIDECTOMY N/A 12/22/2016   Procedure: PARATHYROIDECTOMY, NECK EXPLORATION;  Surgeon: Jackolyn Confer, MD;  Location: [facility];  Service: General;  Laterality: N/A;   TONSILLECTOMY     TOTAL KNEE ARTHROPLASTY Left 05/28/2020   Procedure: LEFT TOTAL KNEE ARTHROPLASTY;   Surgeon: Carole Civil, MD;  Location: [facility];  Service: Orthopedics;  Laterality: Left;   WOUND EXPLORATION Left 05/31/2020   Procedure: WOUND EXPLORATION AND IRRIGATION LEFT KNEE;  Surgeon: Carole Civil, MD;  Location: [facility];  Service: Orthopedics;  Laterality: Left;  irrigation wound exploration, wound closure    There were no vitals filed for this visit.   Subjective Assessment - 08/28/20 0838     Subjective Feeling good today, no reports of pain.  Has been walking with cane at home, confidence slowly improving.    Pertinent History HTN, DB    Patient Stated Goals to be able to get her left leg stronger so she doesn't have to use the walker.    Currently in Pain? No/denies                               [facility] Adult PT Treatment/Exercise - 08/28/20 0001       Ambulation/Gait   Ambulation/Gait Yes    Assistive device None    Gait Comments 3 min inside // bars cueing for heel/toe mechanics intermittent HHA      Knee/Hip Exercises: Machines for Strengthening  Cybex Knee Flexion 5Pl 2x 10    Cybex Leg Press 3Pl 2x 10; 4Pl x 10      Knee/Hip Exercises: Standing   Heel Raises Both;1 set;20 reps    Forward Step Up Left;2 sets;10 reps;Hand Hold: 1;Step Height: 6"    Forward Step Up Limitations slow and controlled isolating quad    Step Down Left;Hand Hold: 2;2 sets;10 reps;Step Height: 6"    Wall Squat 10 reps    Wall Squat Limitations 5" holds, cues to keep weigtht even    Gait Training 3 min inside // bars without AD, cueing for heel to toe and to increase cadence; ambulated wiht SPC through session      Knee/Hip Exercises: Seated   Long Arc Quad Left;2 sets;10 reps    Long Arc Quad Weight 8 lbs.   7.5 then 10                     PT Short Term Goals - 07/15/20 1010       PT SHORT TERM GOAL #1   Title Patient will report at least 50% improvement in overall symptoms and/or function to demonstrate improved functional mobility     Time 3    Period Weeks    Status Achieved    Target Date 07/15/20      PT SHORT TERM GOAL #2   Title Patient will demonstrate 0-120 degrees of left knee ROM    Time 3    Period Weeks    Status On-going    Target Date 07/15/20      PT SHORT TERM GOAL #3   Title Patient will be independent in self management strategies to improve quality of life and functional outcomes.    Time 3    Period Weeks    Status On-going    Target Date 07/15/20               PT Long Term Goals - 07/26/20 1018       PT LONG TERM GOAL #1   Title Patient will report at least 75% improvement in overall symptoms and/or function to demonstrate improved functional mobility    Time 6    Period Weeks    Status Achieved      PT LONG TERM GOAL #2   Title Patient will improve on FOTO score to meet predicted outcomes to demonstrate improved functional mobility.    Time 6    Period Weeks    Status Achieved      PT LONG TERM GOAL #3   Title Patient will be able to ambulate at least 350 feet in 2MWT with least restrictive assistive device in order to demonstrate improved gait speed for community ambulation.    Baseline 250 feet with RW    Time 6    Period Weeks    Status On-going      PT LONG TERM GOAL #4   Title Patient will be able to ambulate at least 100 feet with cane safely    Time 5    Period Weeks    Status New    Target Date 08/30/20                   Plan - 08/28/20 0908     Clinical Impression Statement Session focus with functional strengthening and isolation of quadriceps and gluteal muscualture.  Presents with improved quad strengthening with ability to increase step height, still required UE support.  Gait training wiht  LRAD and no AD, presents with unsteadiness and decreased cadence.  Encouraged to continue with SPC indoors and RW long duration and uneven terrain.  No reports of pain through session, was limited by fatigue.    Personal Factors and Comorbidities  Age;Fitness;Behavior Pattern;Past/Current Experience;Comorbidity 3+;Time since onset of injury/illness/exacerbation    Comorbidities anxiety, DM, HTN    Examination-Activity Limitations Locomotion Level;Transfers;Squat;Stairs;Stand;Lift;Bend    Examination-Participation Restrictions Cleaning;Occupation;Meal Prep;Church;Community Activity;Shop;Volunteer;Yard Work    Stability/Clinical Decision Making Stable/Uncomplicated    Designer, jewellery Low    Rehab Potential Good    PT Frequency 2x / week    PT Duration --   5 weeks   PT Treatment/Interventions ADLs/Self Care Home Management;Aquatic Therapy;Cryotherapy;Electrical Stimulation;Iontophoresis 4mg /ml Dexamethasone;Moist Heat;Traction;Ultrasound;DME Instruction;Gait training;Stair training;Functional mobility training;Therapeutic activities;Therapeutic exercise;Balance training;Neuromuscular re-education;Patient/family education;Manual techniques;Manual lymph drainage;Compression bandaging;Dry needling;Energy conservation;Spinal Manipulations;Joint Manipulations;Splinting;Taping;Passive range of motion    PT Next Visit Plan Review goals next session.  continue with closed kinetic chain strengtheing and independence with gait and basic mobility.  continue to progress to gait with [facility]    PT Home Exercise Plan HH HEP, hamstring  stretch, HR, mini squat at counter; 6/2: Quad set, SAQ, SLR all direction, bridge every day; LAQ with contral lateral assist; walking with cane    Consulted and Agree with Plan of Care Patient             Patient will benefit from skilled therapeutic intervention in order to improve the following deficits and impairments:  Abnormal gait, Decreased endurance, Decreased activity tolerance, Pain, Decreased balance, Impaired flexibility, Improper body mechanics, Decreased strength, Decreased mobility, Difficulty walking, Decreased scar mobility, Decreased skin integrity  Visit Diagnosis: Muscle weakness  (generalized)  Other abnormalities of gait and mobility  History of falling     Problem List Patient Active Problem List   Diagnosis Date Noted   Chronic pain of left knee 05/31/2020   Wound dehiscence, surgical    Status post total left knee replacement 05/28/2020   Primary osteoarthritis of left knee    Polyneuropathy due to secondary diabetes mellitus ([place]) 05/25/2019   Arthritis of right sacroiliac joint 05/25/2019   Cervical disc disorder 05/25/2019   Falls 05/25/2019   General unsteadiness 05/25/2019   Encounter for well woman exam with routine gynecological exam 01/09/2019   Screening for colorectal cancer 01/09/2019   Encounter for screening colonoscopy 12/13/2017   Hyperparathyroidism, primary ([place]) 12/22/2016   Vitamin D deficiency 10/31/2015   Hyperuricemia 05/08/2013   ONYCHOMYCOSIS, TOENAILS 02/22/2009   NEVI, MULTIPLE 02/22/2009   Diabetes mellitus ([place]) 01/25/2008   BACK PAIN WITH RADICULOPATHY 11/09/2007   Hyperlipidemia LDL goal <100 08/26/2007   Morbid obesity ([place]) 08/26/2007   Essential hypertension 08/26/2007   Obstructive sleep apnea 04/20/2007   Ihor Austin, LPTA/CLT; CBIS [phone]  Aldona Lento 08/28/2020, 10:53 AM  [place], [name], [address] Phone: [phone]   Fax:  [phone]  Name: Pamela Stein MRN: [id number] Date of Birth: 05-03-1955

## 2020-08-30 ENCOUNTER — Other Ambulatory Visit: Payer: Self-pay

## 2020-08-30 ENCOUNTER — Encounter (HOSPITAL_COMMUNITY): Payer: Self-pay

## 2020-08-30 ENCOUNTER — Ambulatory Visit (HOSPITAL_COMMUNITY): Payer: Federal, State, Local not specified - PPO

## 2020-08-30 DIAGNOSIS — Z9181 History of falling: Secondary | ICD-10-CM | POA: Diagnosis not present

## 2020-08-30 DIAGNOSIS — R2689 Other abnormalities of gait and mobility: Secondary | ICD-10-CM | POA: Diagnosis not present

## 2020-08-30 DIAGNOSIS — M6281 Muscle weakness (generalized): Secondary | ICD-10-CM | POA: Diagnosis not present

## 2020-08-30 NOTE — Therapy (Signed)
[name], [name], [address] Phone: [phone]   Fax:  [phone]  Physical Therapy Treatment and Recertification  Patient Details  Name: Pamela Stein MRN: [id number] Date of Birth: 1955-03-29 Referring Provider (PT): Arther Abbott   Encounter Date: 08/30/2020   PT End of Session - 08/30/20 0913     Visit Number 25    Number of Visits 31    Date for PT Re-Evaluation 10/11/20    Authorization Type BCBS (no auth, [id number] used)    Authorization - Visit Number 25    Authorization - Number of Visits 42    Progress Note Due on Visit 25    PT Start Time 0900    PT Stop Time 0945    PT Time Calculation (min) 45 min    Activity Tolerance Patient tolerated treatment well;Patient limited by fatigue;No increased pain    Behavior During Therapy WFL for tasks assessed/performed             Past Medical History:  Diagnosis Date   Anxiety    Back pain    Bronchitis    Diabetes mellitus    Dizziness    Heart murmur    History of gout    Hyperlipidemia    Hypertension    Neuropathy    Obesity    Obstructive sleep apnea    CPAP    Past Surgical History:  Procedure Laterality Date   ABDOMINAL HYSTERECTOMY     ADENOIDECTOMY     CATARACT EXTRACTION Left    right 02/2018   COLONOSCOPY  2008   diminutive rectal polyp, s/p removal. polypoid mucosa   COLONOSCOPY WITH PROPOFOL N/A 01/03/2018   Procedure: COLONOSCOPY WITH PROPOFOL;  Surgeon: Daneil Dolin, MD;  [facility];  Service: Endoscopy;  Laterality: N/A;  12:00pm   DILATION AND CURETTAGE OF UTERUS     ENDOMETRIAL ABLATION     EYE SURGERY Bilateral 12/04/2013   cataract   PARATHYROIDECTOMY N/A 12/22/2016   Procedure: PARATHYROIDECTOMY, NECK EXPLORATION;  Surgeon: Jackolyn Confer, MD;  [facility];  Service: General;  Laterality: N/A;   TONSILLECTOMY     TOTAL KNEE ARTHROPLASTY Left 05/28/2020   Procedure: LEFT TOTAL KNEE  ARTHROPLASTY;  Surgeon: Carole Civil, MD;  Location: [facility];  Service: Orthopedics;  Laterality: Left;   WOUND EXPLORATION Left 05/31/2020   Procedure: WOUND EXPLORATION AND IRRIGATION LEFT KNEE;  Surgeon: Carole Civil, MD;  Location: [facility];  Service: Orthopedics;  Laterality: Left;  irrigation wound exploration, wound closure    There were no vitals filed for this visit.   Subjective Assessment - 08/30/20 0912     Subjective No pain, and no falls since last incident in June. Continuing to ambulate with RW and notes instances of knee buckling at home when standing at kitchen counter    Pertinent History HTN, DB    Patient Stated Goals to be able to get her left leg stronger so she doesn't have to use the walker.                [facility] PT Assessment - 08/30/20 0001       Assessment   Medical Diagnosis L TKA    Referring Provider (PT) Arther Abbott      AROM   Left Knee Extension 2   lacking   Left Knee Flexion 122      Strength   Left Knee Flexion 4+/5  Left Knee Extension 4/5      Ambulation/Gait   Ambulation/Gait Yes    Ambulation/Gait Assistance 6: Modified independent (Device/Increase time)    Ambulation Distance (Feet) 240 Feet    Assistive device Rolling walker    Gait Pattern Step-through pattern    Ambulation Surface Level;Indoor    Gait Comments 2MWT      Standardized Balance Assessment   Standardized Balance Assessment Berg Balance Test      Berg Balance Test   Sit to Stand Able to stand without using hands and stabilize independently    Standing Unsupported Able to stand safely 2 minutes    Sitting with Back Unsupported but Feet Supported on Floor or Stool Able to sit safely and securely 2 minutes    Stand to Sit Sits safely with minimal use of hands    Transfers Able to transfer safely, minor use of hands    Standing Unsupported with Eyes Closed Able to stand 10 seconds safely    Standing Unsupported with Feet Together Able to place  feet together independently and stand 1 minute safely    From Standing, Reach Forward with Outstretched Arm Can reach confidently >25 cm (10")    From Standing Position, Pick up Object from Floor Able to pick up shoe, needs supervision    From Standing Position, Turn to Look Behind Over each Shoulder Looks behind from both sides and weight shifts well    Turn 360 Degrees Able to turn 360 degrees safely but slowly    Standing Unsupported, Alternately Place Feet on Step/Stool Able to complete >2 steps/needs minimal assist    Standing Unsupported, One Foot in [employer] balance while stepping or standing    Standing on One Leg Tries to lift leg/unable to hold 3 seconds but remains standing independently    Total Score 43                           [facility] Adult PT Treatment/Exercise - 08/30/20 0001       Knee/Hip Exercises: Standing   SLS lift-offs from base cabinet for LLE SLS 2x10 2 sec hold                    PT Education - 08/30/20 1030     Education Details discussion regarding assessment findings and Oceanographer Test interpretation    Person(s) Educated Patient    Methods Explanation    Comprehension Verbalized understanding              PT Short Term Goals - 08/30/20 1035       PT SHORT TERM GOAL #1   Title Patient will report at least 50% improvement in overall symptoms and/or function to demonstrate improved functional mobility    Time 3    Period Weeks    Status Achieved    Target Date 07/15/20      PT SHORT TERM GOAL #2   Title Patient will demonstrate 0-120 degrees of left knee ROM    Baseline 0-122    Time 3    Period Weeks    Status Achieved    Target Date 07/15/20      PT SHORT TERM GOAL #3   Title Patient will be independent in self management strategies to improve quality of life and functional outcomes.    Time 3    Period Weeks    Status Achieved    Target Date 07/15/20  PT SHORT TERM GOAL #4   Title Demo  decreased risk for falls as evidenced by score of 50/56 Berg Balance Test    Baseline 43/56    Time 3    Period Weeks    Status New    Target Date 09/20/20               PT Long Term Goals - 08/30/20 1049       PT LONG TERM GOAL #1   Title Patient will report at least 75% improvement in overall symptoms and/or function to demonstrate improved functional mobility    Time 6    Period Weeks    Status Achieved      PT LONG TERM GOAL #2   Title Patient will improve on FOTO score to meet predicted outcomes to demonstrate improved functional mobility.    Time 6    Period Weeks    Status Achieved      PT LONG TERM GOAL #3   Title Patient will be able to ambulate at least 350 feet in 2MWT with least restrictive assistive device in order to demonstrate improved gait speed for community ambulation.    Baseline 250 feet with RW    Time 6    Period Weeks    Status On-going      PT LONG TERM GOAL #4   Title Patient will be able to ambulate at least 100 feet with cane safely    Time 5    Period Weeks    Status New      PT LONG TERM GOAL #5   Title Decrease risk for falls as evidenced by score 54/56 Berg Balance Test    Baseline 43/56    Time 6    Period Weeks    Status New    Target Date 10/11/20                   Plan - 08/30/20 1032     Clinical Impression Statement Progressing with POC details but continues to exhibit LLE deficits especially with quadriceps activation and single limb support requiring continued use of RW for ambulation. Demonstrates increased risk for falls as evidenced by score of 43/56 Berg Balance Test with increased limitations evident with LLE biased movements, loading response, single limb stance, and limitations with narrow BOS. Recommend continued sessions 1x/wk to progress HEP development. Pt encouraged to attend local senior center most days of the week to use strength/aerobic equipment    Personal Factors and Comorbidities  Age;Fitness;Behavior Pattern;Past/Current Experience;Comorbidity 3+;Time since onset of injury/illness/exacerbation    Comorbidities anxiety, DM, HTN    Examination-Activity Limitations Locomotion Level;Transfers;Squat;Stairs;Stand;Lift;Bend    Examination-Participation Restrictions Cleaning;Occupation;Meal Prep;Church;Community Activity;Shop;Volunteer;Yard Work    Stability/Clinical Decision Making Stable/Uncomplicated    Rehab Potential Good    PT Frequency 1x / week    PT Duration 6 weeks   5 weeks   PT Treatment/Interventions ADLs/Self Care Home Management;Aquatic Therapy;Cryotherapy;Electrical Stimulation;Iontophoresis '4mg'$ /ml Dexamethasone;Moist Heat;Traction;Ultrasound;DME Instruction;Gait training;Stair training;Functional mobility training;Therapeutic activities;Therapeutic exercise;Balance training;Neuromuscular re-education;Patient/family education;Manual techniques;Manual lymph drainage;Compression bandaging;Dry needling;Energy conservation;Spinal Manipulations;Joint Manipulations;Splinting;Taping;Passive range of motion    PT Next Visit Plan progressing HEP    PT Home Exercise Plan HH HEP, hamstring  stretch, HR, mini squat at counter; 6/2: Quad set, SAQ, SLR all direction, bridge every day; LAQ with contral lateral assist; walking with cane; Single leg lift-offs from kitchen sink for SLS    Consulted and Agree with Plan of Care Patient  Patient will benefit from skilled therapeutic intervention in order to improve the following deficits and impairments:  Abnormal gait, Decreased endurance, Decreased activity tolerance, Pain, Decreased balance, Impaired flexibility, Improper body mechanics, Decreased strength, Decreased mobility, Difficulty walking, Decreased scar mobility, Decreased skin integrity  Visit Diagnosis: Muscle weakness (generalized)  Other abnormalities of gait and mobility  History of falling     Problem List Patient Active Problem List    Diagnosis Date Noted   Chronic pain of left knee 05/31/2020   Wound dehiscence, surgical    Status post total left knee replacement 05/28/2020   Primary osteoarthritis of left knee    Polyneuropathy due to secondary diabetes mellitus ([place]) 05/25/2019   Arthritis of right sacroiliac joint 05/25/2019   Cervical disc disorder 05/25/2019   Falls 05/25/2019   General unsteadiness 05/25/2019   Encounter for well woman exam with routine gynecological exam 01/09/2019   Screening for colorectal cancer 01/09/2019   Encounter for screening colonoscopy 12/13/2017   Hyperparathyroidism, primary ([place]) 12/22/2016   Vitamin D deficiency 10/31/2015   Hyperuricemia 05/08/2013   ONYCHOMYCOSIS, TOENAILS 02/22/2009   NEVI, MULTIPLE 02/22/2009   Diabetes mellitus ([place]) 01/25/2008   BACK PAIN WITH RADICULOPATHY 11/09/2007   Hyperlipidemia LDL goal <100 08/26/2007   Morbid obesity ([place]) 08/26/2007   Essential hypertension 08/26/2007   Obstructive sleep apnea 04/20/2007   10:57 AM, 08/30/20 M. Sherlyn Lees, PT, DPT Physical Therapist- [place] Office Number: [phone]   [place] [address] [place], [name], [address] Phone: [phone]   Fax:  [phone]  Name: PURITY BENET MRN: [id number] Date of Birth: December 08, 1955

## 2020-09-03 ENCOUNTER — Other Ambulatory Visit: Payer: Self-pay | Admitting: Orthopedic Surgery

## 2020-09-03 ENCOUNTER — Other Ambulatory Visit: Payer: Self-pay

## 2020-09-03 ENCOUNTER — Encounter (HOSPITAL_COMMUNITY): Payer: Self-pay | Admitting: Physical Therapy

## 2020-09-03 ENCOUNTER — Ambulatory Visit (HOSPITAL_COMMUNITY): Payer: Federal, State, Local not specified - PPO | Admitting: Physical Therapy

## 2020-09-03 DIAGNOSIS — M6281 Muscle weakness (generalized): Secondary | ICD-10-CM

## 2020-09-03 DIAGNOSIS — Z9181 History of falling: Secondary | ICD-10-CM | POA: Diagnosis not present

## 2020-09-03 DIAGNOSIS — R2689 Other abnormalities of gait and mobility: Secondary | ICD-10-CM | POA: Diagnosis not present

## 2020-09-03 NOTE — Therapy (Signed)
[place], [name], [address] Phone: [phone]   Fax:  [phone]  Physical Therapy Treatment  Patient Details  Name: Pamela Stein MRN: [id number] Date of Birth: 1955-06-02 Referring Provider (PT): Arther Abbott   Encounter Date: 09/03/2020   PT End of Session - 09/03/20 1001     Visit Number 26    Number of Visits 31    Date for PT Re-Evaluation 10/11/20    Authorization Type BCBS (no auth, [id number])    Authorization - Visit Number 26    Authorization - Number of Visits 42    Progress Note Due on Visit 31    PT Start Time 1002    PT Stop Time 1040    PT Time Calculation (min) 38 min    Activity Tolerance Patient tolerated treatment well;Patient limited by fatigue;No increased pain    Behavior During Therapy WFL for tasks assessed/performed             Past Medical History:  Diagnosis Date   Anxiety    Back pain    Bronchitis    Diabetes mellitus    Dizziness    Heart murmur    History of gout    Hyperlipidemia    Hypertension    Neuropathy    Obesity    Obstructive sleep apnea    CPAP    Past Surgical History:  Procedure Laterality Date   ABDOMINAL HYSTERECTOMY     ADENOIDECTOMY     CATARACT EXTRACTION Left    right 02/2018   COLONOSCOPY  2008   diminutive rectal polyp, s/p removal. polypoid mucosa   COLONOSCOPY WITH PROPOFOL N/A 01/03/2018   Procedure: COLONOSCOPY WITH PROPOFOL;  Surgeon: Daneil Dolin, MD;  [facility];  Service: Endoscopy;  Laterality: N/A;  12:00pm   DILATION AND CURETTAGE OF UTERUS     ENDOMETRIAL ABLATION     EYE SURGERY Bilateral 12/04/2013   cataract   PARATHYROIDECTOMY N/A 12/22/2016   Procedure: PARATHYROIDECTOMY, NECK EXPLORATION;  Surgeon: Jackolyn Confer, MD;  Location: [facility];  Service: General;  Laterality: N/A;   TONSILLECTOMY     TOTAL KNEE ARTHROPLASTY Left 05/28/2020   Procedure: LEFT TOTAL KNEE ARTHROPLASTY;   Surgeon: Carole Civil, MD;  Location: [facility];  Service: Orthopedics;  Laterality: Left;   WOUND EXPLORATION Left 05/31/2020   Procedure: WOUND EXPLORATION AND IRRIGATION LEFT KNEE;  Surgeon: Carole Civil, MD;  Location: [facility];  Service: Orthopedics;  Laterality: Left;  irrigation wound exploration, wound closure    There were no vitals filed for this visit.   Subjective Assessment - 09/03/20 1007     Subjective Reports no pain but she has been practicing walking with her cane.    Pertinent History HTN, DB    Patient Stated Goals to be able to get her left leg stronger so she doesn't have to use the walker.    Currently in Pain? No/denies                [facility] PT Assessment - 09/03/20 0001       Assessment   Medical Diagnosis L TKA    Referring Provider (PT) Arther Abbott                           [facility] Adult PT Treatment/Exercise - 09/03/20 0001       Knee/Hip Exercises: Seated   Long Arc  Quad --   18 minutes - verbal and tactiles cues to keep hip in neutral - tends to ER - frequent verbal cues - slow lower   Sit to Sand --   right leg elevated on foam -- focus on left WB - 20 minutes                     PT Short Term Goals - 08/30/20 1035       PT SHORT TERM GOAL #1   Title Patient will report at least 50% improvement in overall symptoms and/or function to demonstrate improved functional mobility    Time 3    Period Weeks    Status Achieved    Target Date 07/15/20      PT SHORT TERM GOAL #2   Title Patient will demonstrate 0-120 degrees of left knee ROM    Baseline 0-122    Time 3    Period Weeks    Status Achieved    Target Date 07/15/20      PT SHORT TERM GOAL #3   Title Patient will be independent in self management strategies to improve quality of life and functional outcomes.    Time 3    Period Weeks    Status Achieved    Target Date 07/15/20      PT SHORT TERM GOAL #4   Title Demo decreased risk for  falls as evidenced by score of 50/56 Berg Balance Test    Baseline 43/56    Time 3    Period Weeks    Status New    Target Date 09/20/20               PT Long Term Goals - 08/30/20 1049       PT LONG TERM GOAL #1   Title Patient will report at least 75% improvement in overall symptoms and/or function to demonstrate improved functional mobility    Time 6    Period Weeks    Status Achieved      PT LONG TERM GOAL #2   Title Patient will improve on FOTO score to meet predicted outcomes to demonstrate improved functional mobility.    Time 6    Period Weeks    Status Achieved      PT LONG TERM GOAL #3   Title Patient will be able to ambulate at least 350 feet in 2MWT with least restrictive assistive device in order to demonstrate improved gait speed for community ambulation.    Baseline 250 feet with RW    Time 6    Period Weeks    Status On-going      PT LONG TERM GOAL #4   Title Patient will be able to ambulate at least 100 feet with cane safely    Time 5    Period Weeks    Status New      PT LONG TERM GOAL #5   Title Decrease risk for falls as evidenced by score 54/56 Berg Balance Test    Baseline 43/56    Time 6    Period Weeks    Status New    Target Date 10/11/20                   Plan - 09/03/20 1002     Clinical Impression Statement Session focused on increased use of left LE. With LAQ patient tends to ER and use adductors/abductors to extend/lift her knee. Verbal and frequent uses for form  and posture. Wrote out all cues on LAQ form for HEP adherence. Fatigue with this exercise noted. Focused on sit to stand with left leg as patient continues to primarily use her right leg with all transitions. Fatigue noted with all exercises. Will try total gym next session as she continues have poor carryover with sit to stands after practice.    Personal Factors and Comorbidities Age;Fitness;Behavior Pattern;Past/Current Experience;Comorbidity 3+;Time since onset  of injury/illness/exacerbation    Comorbidities anxiety, DM, HTN    Examination-Activity Limitations Locomotion Level;Transfers;Squat;Stairs;Stand;Lift;Bend    Examination-Participation Restrictions Cleaning;Occupation;Meal Prep;Church;Community Activity;Shop;Volunteer;Yard Work    Stability/Clinical Decision Making Stable/Uncomplicated    Rehab Potential Good    PT Frequency 1x / week    PT Duration 6 weeks   5 weeks   PT Treatment/Interventions ADLs/Self Care Home Management;Aquatic Therapy;Cryotherapy;Electrical Stimulation;Iontophoresis '4mg'$ /ml Dexamethasone;Moist Heat;Traction;Ultrasound;DME Instruction;Gait training;Stair training;Functional mobility training;Therapeutic activities;Therapeutic exercise;Balance training;Neuromuscular re-education;Patient/family education;Manual techniques;Manual lymph drainage;Compression bandaging;Dry needling;Energy conservation;Spinal Manipulations;Joint Manipulations;Splinting;Taping;Passive range of motion    PT Next Visit Plan Total gym single leg press    PT Home Exercise Plan HH HEP, hamstring  stretch, HR, mini squat at counter; 6/2: Quad set, SAQ, SLR all direction, bridge every day; LAQ with contral lateral assist; walking with cane; Single leg lift-offs from kitchen sink for SLS    Consulted and Agree with Plan of Care Patient             Patient will benefit from skilled therapeutic intervention in order to improve the following deficits and impairments:  Abnormal gait, Decreased endurance, Decreased activity tolerance, Pain, Decreased balance, Impaired flexibility, Improper body mechanics, Decreased strength, Decreased mobility, Difficulty walking, Decreased scar mobility, Decreased skin integrity  Visit Diagnosis: Muscle weakness (generalized)  Other abnormalities of gait and mobility  History of falling     Problem List Patient Active Problem List   Diagnosis Date Noted   Chronic pain of left knee 05/31/2020   Wound dehiscence,  surgical    Status post total left knee replacement 05/28/2020   Primary osteoarthritis of left knee    Polyneuropathy due to secondary diabetes mellitus ([place]) 05/25/2019   Arthritis of right sacroiliac joint 05/25/2019   Cervical disc disorder 05/25/2019   Falls 05/25/2019   General unsteadiness 05/25/2019   Encounter for well woman exam with routine gynecological exam 01/09/2019   Screening for colorectal cancer 01/09/2019   Encounter for screening colonoscopy 12/13/2017   Hyperparathyroidism, primary ([place]) 12/22/2016   Vitamin D deficiency 10/31/2015   Hyperuricemia 05/08/2013   ONYCHOMYCOSIS, TOENAILS 02/22/2009   NEVI, MULTIPLE 02/22/2009   Diabetes mellitus ([place]) 01/25/2008   BACK PAIN WITH RADICULOPATHY 11/09/2007   Hyperlipidemia LDL goal <100 08/26/2007   Morbid obesity ([place]) 08/26/2007   Essential hypertension 08/26/2007   Obstructive sleep apnea 04/20/2007   10:42 AM, 09/03/20 Jerene Pitch, DPT Physical Therapy with [facility]  [phone] office   [place], [name], [address] Phone: [phone]   Fax:  [phone]  Name: Pamela Stein MRN: [id number] Date of Birth: 1955-08-04

## 2020-09-04 ENCOUNTER — Other Ambulatory Visit: Payer: Self-pay | Admitting: Orthopedic Surgery

## 2020-09-04 MED ORDER — MELOXICAM 7.5 MG PO TABS
7.5000 mg | ORAL_TABLET | Freq: Every day | ORAL | 5 refills | Status: DC
Start: 2020-09-04 — End: 2020-11-21

## 2020-09-04 NOTE — Progress Notes (Signed)
Meds ordered this encounter  Medications   meloxicam (MOBIC) 7.5 MG tablet    Sig: Take 1 tablet (7.5 mg total) by mouth daily.    Dispense:  30 tablet    Refill:  5    

## 2020-09-11 ENCOUNTER — Other Ambulatory Visit: Payer: Self-pay

## 2020-09-11 ENCOUNTER — Ambulatory Visit (HOSPITAL_COMMUNITY): Payer: Federal, State, Local not specified - PPO | Attending: Orthopedic Surgery

## 2020-09-11 ENCOUNTER — Encounter (HOSPITAL_COMMUNITY): Payer: Self-pay

## 2020-09-11 DIAGNOSIS — G8929 Other chronic pain: Secondary | ICD-10-CM | POA: Insufficient documentation

## 2020-09-11 DIAGNOSIS — M25562 Pain in left knee: Secondary | ICD-10-CM | POA: Insufficient documentation

## 2020-09-11 DIAGNOSIS — Z9181 History of falling: Secondary | ICD-10-CM | POA: Insufficient documentation

## 2020-09-11 DIAGNOSIS — M6281 Muscle weakness (generalized): Secondary | ICD-10-CM | POA: Diagnosis not present

## 2020-09-11 DIAGNOSIS — R2689 Other abnormalities of gait and mobility: Secondary | ICD-10-CM | POA: Diagnosis not present

## 2020-09-11 NOTE — Therapy (Signed)
[place], [name], [address] Phone: [phone]   Fax:  [phone]  Physical Therapy Treatment  Patient Details  Name: Pamela Stein MRN: [id number] Date of Birth: Apr 24, 1955 Referring Provider (PT): Arther Abbott   Encounter Date: 09/11/2020   PT End of Session - 09/11/20 1353     Visit Number 27    Number of Visits 31    Date for PT Re-Evaluation 10/11/20    Authorization Type BCBS (no auth, VL [id number] used)    Authorization - Visit Number 27    Authorization - Number of Visits 42    Progress Note Due on Visit 31    PT Start Time 1345    PT Stop Time 1430    PT Time Calculation (min) 45 min    Activity Tolerance Patient tolerated treatment well;Patient limited by fatigue;No increased pain    Behavior During Therapy WFL for tasks assessed/performed             Past Medical History:  Diagnosis Date   Anxiety    Back pain    Bronchitis    Diabetes mellitus    Dizziness    Heart murmur    History of gout    Hyperlipidemia    Hypertension    Neuropathy    Obesity    Obstructive sleep apnea    CPAP    Past Surgical History:  Procedure Laterality Date   ABDOMINAL HYSTERECTOMY     ADENOIDECTOMY     CATARACT EXTRACTION Left    right 02/2018   COLONOSCOPY  2008   diminutive rectal polyp, s/p removal. polypoid mucosa   COLONOSCOPY WITH PROPOFOL N/A 01/03/2018   Procedure: COLONOSCOPY WITH PROPOFOL;  Surgeon: Daneil Dolin, MD;  [facility];  Service: Endoscopy;  Laterality: N/A;  12:00pm   DILATION AND CURETTAGE OF UTERUS     ENDOMETRIAL ABLATION     EYE SURGERY Bilateral 12/04/2013   cataract   PARATHYROIDECTOMY N/A 12/22/2016   Procedure: PARATHYROIDECTOMY, NECK EXPLORATION;  Surgeon: Jackolyn Confer, MD;  Location: [facility];  Service: General;  Laterality: N/A;   TONSILLECTOMY     TOTAL KNEE ARTHROPLASTY Left 05/28/2020   Procedure: LEFT TOTAL KNEE ARTHROPLASTY;  Surgeon:  Carole Civil, MD;  Location: [facility];  Service: Orthopedics;  Laterality: Left;   WOUND EXPLORATION Left 05/31/2020   Procedure: WOUND EXPLORATION AND IRRIGATION LEFT KNEE;  Surgeon: Carole Civil, MD;  Location: [facility];  Service: Orthopedics;  Laterality: Left;  irrigation wound exploration, wound closure    There were no vitals filed for this visit.   Subjective Assessment - 09/11/20 1353     Subjective "Doing my exercises several times a day"    Pertinent History HTN, DB    Patient Stated Goals to be able to get her left leg stronger so she doesn't have to use the walker.                [facility] PT Assessment - 09/11/20 0001       Assessment   Medical Diagnosis L TKA    Referring Provider (PT) Arther Abbott                           [facility] Adult PT Treatment/Exercise - 09/11/20 0001       Knee/Hip Exercises: Aerobic   Nustep level 3 x 5 min for dynamic warm-up  Knee/Hip Exercises: Machines for Strengthening   Cybex Knee Flexion 5Pl 3x 10      Knee/Hip Exercises: Standing   Functional Squat 3 sets;10 reps    Functional Squat Limitations heels elevated    Other Standing Knee Exercises alternating march 2x10 with 4 lbs dumbell      Knee/Hip Exercises: Seated   Long Arc Quad Strengthening;Left;3 sets;10 reps    Long Arc Quad Weight 10 lbs.                      PT Short Term Goals - 08/30/20 1035       PT SHORT TERM GOAL #1   Title Patient will report at least 50% improvement in overall symptoms and/or function to demonstrate improved functional mobility    Time 3    Period Weeks    Status Achieved    Target Date 07/15/20      PT SHORT TERM GOAL #2   Title Patient will demonstrate 0-120 degrees of left knee ROM    Baseline 0-122    Time 3    Period Weeks    Status Achieved    Target Date 07/15/20      PT SHORT TERM GOAL #3   Title Patient will be independent in self management strategies to improve quality of  life and functional outcomes.    Time 3    Period Weeks    Status Achieved    Target Date 07/15/20      PT SHORT TERM GOAL #4   Title Demo decreased risk for falls as evidenced by score of 50/56 Berg Balance Test    Baseline 43/56    Time 3    Period Weeks    Status New    Target Date 09/20/20               PT Long Term Goals - 08/30/20 1049       PT LONG TERM GOAL #1   Title Patient will report at least 75% improvement in overall symptoms and/or function to demonstrate improved functional mobility    Time 6    Period Weeks    Status Achieved      PT LONG TERM GOAL #2   Title Patient will improve on FOTO score to meet predicted outcomes to demonstrate improved functional mobility.    Time 6    Period Weeks    Status Achieved      PT LONG TERM GOAL #3   Title Patient will be able to ambulate at least 350 feet in 2MWT with least restrictive assistive device in order to demonstrate improved gait speed for community ambulation.    Baseline 250 feet with RW    Time 6    Period Weeks    Status On-going      PT LONG TERM GOAL #4   Title Patient will be able to ambulate at least 100 feet with cane safely    Time 5    Period Weeks    Status New      PT LONG TERM GOAL #5   Title Decrease risk for falls as evidenced by score 54/56 Berg Balance Test    Baseline 43/56    Time 6    Period Weeks    Status New    Target Date 10/11/20                   Plan - 09/11/20 1422     Clinical Impression  Statement Tolerating tx sessions well and demonstrating improved left quad strength as evidenced by LAQ with 10 lbs albeit lacking full terminal knee extension in open chain position. Improving eccentric control demonstrated with sit to stand/squat form. Continues to exhibit difficulty with left quadricep control during gait and stair negotiation    Personal Factors and Comorbidities Age;Fitness;Behavior Pattern;Past/Current Experience;Comorbidity 3+;Time since onset of  injury/illness/exacerbation    Comorbidities anxiety, DM, HTN    Examination-Activity Limitations Locomotion Level;Transfers;Squat;Stairs;Stand;Lift;Bend    Examination-Participation Restrictions Cleaning;Occupation;Meal Prep;Church;Community Activity;Shop;Volunteer;Yard Work    Stability/Clinical Decision Making Stable/Uncomplicated    Rehab Potential Good    PT Frequency 1x / week    PT Duration 6 weeks   5 weeks   PT Treatment/Interventions ADLs/Self Care Home Management;Aquatic Therapy;Cryotherapy;Electrical Stimulation;Iontophoresis '4mg'$ /ml Dexamethasone;Moist Heat;Traction;Ultrasound;DME Instruction;Gait training;Stair training;Functional mobility training;Therapeutic activities;Therapeutic exercise;Balance training;Neuromuscular re-education;Patient/family education;Manual techniques;Manual lymph drainage;Compression bandaging;Dry needling;Energy conservation;Spinal Manipulations;Joint Manipulations;Splinting;Taping;Passive range of motion    PT Next Visit Plan Total gym single leg press    PT Home Exercise Plan HH HEP, hamstring  stretch, HR, mini squat at counter; 6/2: Quad set, SAQ, SLR all direction, bridge every day; LAQ with contral lateral assist; walking with cane; Single leg lift-offs from kitchen sink for SLS    Consulted and Agree with Plan of Care Patient             Patient will benefit from skilled therapeutic intervention in order to improve the following deficits and impairments:  Abnormal gait, Decreased endurance, Decreased activity tolerance, Pain, Decreased balance, Impaired flexibility, Improper body mechanics, Decreased strength, Decreased mobility, Difficulty walking, Decreased scar mobility, Decreased skin integrity  Visit Diagnosis: Chronic pain of left knee     Problem List Patient Active Problem List   Diagnosis Date Noted   Chronic pain of left knee 05/31/2020   Wound dehiscence, surgical    Status post total left knee replacement 05/28/2020    Primary osteoarthritis of left knee    Polyneuropathy due to secondary diabetes mellitus ([place]) 05/25/2019   Arthritis of right sacroiliac joint 05/25/2019   Cervical disc disorder 05/25/2019   Falls 05/25/2019   General unsteadiness 05/25/2019   Encounter for well woman exam with routine gynecological exam 01/09/2019   Screening for colorectal cancer 01/09/2019   Encounter for screening colonoscopy 12/13/2017   Hyperparathyroidism, primary ([place]) 12/22/2016   Vitamin D deficiency 10/31/2015   Hyperuricemia 05/08/2013   ONYCHOMYCOSIS, TOENAILS 02/22/2009   NEVI, MULTIPLE 02/22/2009   Diabetes mellitus ([place]) 01/25/2008   BACK PAIN WITH RADICULOPATHY 11/09/2007   Hyperlipidemia LDL goal <100 08/26/2007   Morbid obesity ([place]) 08/26/2007   Essential hypertension 08/26/2007   Obstructive sleep apnea 04/20/2007    Pamela Stein 09/11/2020, 2:35 PM  [place], [name], [address] Phone: [phone]   Fax:  [phone]  Name: Pamela Stein MRN: [id number] Date of Birth: [birth year]

## 2020-09-17 ENCOUNTER — Encounter: Payer: Self-pay | Admitting: Orthopedic Surgery

## 2020-09-17 ENCOUNTER — Telehealth: Payer: Self-pay | Admitting: Orthopedic Surgery

## 2020-09-17 NOTE — Telephone Encounter (Signed)
Spoke with patient 09/13/20 regarding request for updated out of work letter. Aware will call when ready for pick up. Called patient; left message letter is ready.

## 2020-09-19 ENCOUNTER — Other Ambulatory Visit: Payer: Self-pay

## 2020-09-19 ENCOUNTER — Ambulatory Visit (HOSPITAL_COMMUNITY): Payer: Federal, State, Local not specified - PPO

## 2020-09-19 DIAGNOSIS — G8929 Other chronic pain: Secondary | ICD-10-CM | POA: Diagnosis not present

## 2020-09-19 DIAGNOSIS — R2689 Other abnormalities of gait and mobility: Secondary | ICD-10-CM

## 2020-09-19 DIAGNOSIS — M25562 Pain in left knee: Secondary | ICD-10-CM | POA: Diagnosis not present

## 2020-09-19 DIAGNOSIS — Z9181 History of falling: Secondary | ICD-10-CM | POA: Diagnosis not present

## 2020-09-19 DIAGNOSIS — M6281 Muscle weakness (generalized): Secondary | ICD-10-CM

## 2020-09-19 NOTE — Therapy (Signed)
[place], [name], [address] Phone: [phone]   Fax:  [phone]  Physical Therapy Treatment  Patient Details  Name: Pamela Stein MRN: [id number] Date of Birth: 11/18/1955 Referring Provider (PT): Arther Abbott   Encounter Date: 09/19/2020   PT End of Session - 09/19/20 1000     Visit Number 28    Number of Visits 31    Date for PT Re-Evaluation 10/11/20    Authorization Type BCBS (no auth, [id number] used)    Authorization - Visit Number 28    Authorization - Number of Visits 42    Progress Note Due on Visit 31    PT Start Time 0953    PT Stop Time 1031    PT Time Calculation (min) 38 min    Activity Tolerance Patient tolerated treatment well;Patient limited by fatigue;No increased pain    Behavior During Therapy WFL for tasks assessed/performed             Past Medical History:  Diagnosis Date   Anxiety    Back pain    Bronchitis    Diabetes mellitus    Dizziness    Heart murmur    History of gout    Hyperlipidemia    Hypertension    Neuropathy    Obesity    Obstructive sleep apnea    CPAP    Past Surgical History:  Procedure Laterality Date   ABDOMINAL HYSTERECTOMY     ADENOIDECTOMY     CATARACT EXTRACTION Left    right 02/2018   COLONOSCOPY  2008   diminutive rectal polyp, s/p removal. polypoid mucosa   COLONOSCOPY WITH PROPOFOL N/A 01/03/2018   Procedure: COLONOSCOPY WITH PROPOFOL;  Surgeon: Daneil Dolin, MD;  [facility];  Service: Endoscopy;  Laterality: N/A;  12:00pm   DILATION AND CURETTAGE OF UTERUS     ENDOMETRIAL ABLATION     EYE SURGERY Bilateral 12/04/2013   cataract   PARATHYROIDECTOMY N/A 12/22/2016   Procedure: PARATHYROIDECTOMY, NECK EXPLORATION;  Surgeon: Jackolyn Confer, MD;  [facility]: [facility];  Service: General;  Laterality: N/A;   TONSILLECTOMY     TOTAL KNEE ARTHROPLASTY Left 05/28/2020   Procedure: LEFT TOTAL KNEE ARTHROPLASTY;   Surgeon: Carole Civil, MD;  Location: [facility];  Service: Orthopedics;  Laterality: Left;   WOUND EXPLORATION Left 05/31/2020   Procedure: WOUND EXPLORATION AND IRRIGATION LEFT KNEE;  Surgeon: Carole Civil, MD;  Location: [facility];  Service: Orthopedics;  Laterality: Left;  irrigation wound exploration, wound closure    There were no vitals filed for this visit.   Subjective Assessment - 09/19/20 0959     Subjective Pt reports she has been [facility] 30 min to exercise    Pertinent History HTN, DB    Patient Stated Goals to be able to get her left leg stronger so she doesn't have to use the walker.                [facility] PT Assessment - 09/19/20 0001       Assessment   Medical Diagnosis L TKA    Referring Provider (PT) Arther Abbott                           [facility] Adult PT Treatment/Exercise - 09/19/20 0001       Knee/Hip Exercises: Aerobic   Nustep level 3 x 5 min  for dynamic warm-up      Knee/Hip Exercises: Machines for Strengthening   Cybex Knee Flexion 5Pl 3x 10      Knee/Hip Exercises: Standing   Gait Training tandem stance 3x15 sec left/right    Other Standing Knee Exercises alternating march 2x10 with 8 lbs unilateral carry    Other Standing Knee Exercises sidestepping 4x20 ft      Knee/Hip Exercises: Seated   Long Arc Quad Strengthening;Left;3 sets;10 reps    Long Arc Quad Weight 10 lbs.    Sit to Sand 3 sets;10 reps   8 lbs goblet squat                     PT Short Term Goals - 08/30/20 1035       PT SHORT TERM GOAL #1   Title Patient will report at least 50% improvement in overall symptoms and/or function to demonstrate improved functional mobility    Time 3    Period Weeks    Status Achieved    Target Date 07/15/20      PT SHORT TERM GOAL #2   Title Patient will demonstrate 0-120 degrees of left knee ROM    Baseline 0-122    Time 3    Period Weeks    Status Achieved    Target Date  07/15/20      PT SHORT TERM GOAL #3   Title Patient will be independent in self management strategies to improve quality of life and functional outcomes.    Time 3    Period Weeks    Status Achieved    Target Date 07/15/20      PT SHORT TERM GOAL #4   Title Demo decreased risk for falls as evidenced by score of 50/56 Berg Balance Test    Baseline 43/56    Time 3    Period Weeks    Status New    Target Date 09/20/20               PT Long Term Goals - 08/30/20 1049       PT LONG TERM GOAL #1   Title Patient will report at least 75% improvement in overall symptoms and/or function to demonstrate improved functional mobility    Time 6    Period Weeks    Status Achieved      PT LONG TERM GOAL #2   Title Patient will improve on FOTO score to meet predicted outcomes to demonstrate improved functional mobility.    Time 6    Period Weeks    Status Achieved      PT LONG TERM GOAL #3   Title Patient will be able to ambulate at least 350 feet in 2MWT with least restrictive assistive device in order to demonstrate improved gait speed for community ambulation.    Baseline 250 feet with RW    Time 6    Period Weeks    Status On-going      PT LONG TERM GOAL #4   Title Patient will be able to ambulate at least 100 feet with cane safely    Time 5    Period Weeks    Status New      PT LONG TERM GOAL #5   Title Decrease risk for falls as evidenced by score 54/56 Berg Balance Test    Baseline 43/56    Time 6    Period Weeks    Status New    Target Date 10/11/20  Plan - 09/19/20 1024     Clinical Impression Statement Progressing very well with POC details and has been able to resume ambulation with cane. Increased resistance and repetition with exercises without adverse effects. Demonstrates onset of LLE fatigue with continued activity and subsequent decrease in LLE stance stability in gait noted. Continued sessions to refine HEP and prepare for D/C  to home and fitness-facility program for long-term maintenance.    Personal Factors and Comorbidities Age;Fitness;Behavior Pattern;Past/Current Experience;Comorbidity 3+;Time since onset of injury/illness/exacerbation    Comorbidities anxiety, DM, HTN    Examination-Activity Limitations Locomotion Level;Transfers;Squat;Stairs;Stand;Lift;Bend    Examination-Participation Restrictions Cleaning;Occupation;Meal Prep;Church;Community Activity;Shop;Volunteer;Yard Work    Stability/Clinical Decision Making Stable/Uncomplicated    Rehab Potential Good    PT Frequency 1x / week    PT Duration 6 weeks   5 weeks   PT Treatment/Interventions ADLs/Self Care Home Management;Aquatic Therapy;Cryotherapy;Electrical Stimulation;Iontophoresis '4mg'$ /ml Dexamethasone;Moist Heat;Traction;Ultrasound;DME Instruction;Gait training;Stair training;Functional mobility training;Therapeutic activities;Therapeutic exercise;Balance training;Neuromuscular re-education;Patient/family education;Manual techniques;Manual lymph drainage;Compression bandaging;Dry needling;Energy conservation;Spinal Manipulations;Joint Manipulations;Splinting;Taping;Passive range of motion    PT Next Visit Plan Total gym single leg press    PT Home Exercise Plan HH HEP, hamstring  stretch, HR, mini squat at counter; 6/2: Quad set, SAQ, SLR all direction, bridge every day; LAQ with contral lateral assist; walking with cane; Single leg lift-offs from kitchen sink for SLS    Consulted and Agree with Plan of Care Patient             Patient will benefit from skilled therapeutic intervention in order to improve the following deficits and impairments:  Abnormal gait, Decreased endurance, Decreased activity tolerance, Pain, Decreased balance, Impaired flexibility, Improper body mechanics, Decreased strength, Decreased mobility, Difficulty walking, Decreased scar mobility, Decreased skin integrity  Visit Diagnosis: Chronic pain of left knee  Muscle weakness  (generalized)  Other abnormalities of gait and mobility  History of falling     Problem List Patient Active Problem List   Diagnosis Date Noted   Chronic pain of left knee 05/31/2020   Wound dehiscence, surgical    Status post total left knee replacement 05/28/2020   Primary osteoarthritis of left knee    Polyneuropathy due to secondary diabetes mellitus ([place]) 05/25/2019   Arthritis of right sacroiliac joint 05/25/2019   Cervical disc disorder 05/25/2019   Falls 05/25/2019   General unsteadiness 05/25/2019   Encounter for well woman exam with routine gynecological exam 01/09/2019   Screening for colorectal cancer 01/09/2019   Encounter for screening colonoscopy 12/13/2017   Hyperparathyroidism, primary ([place]) 12/22/2016   Vitamin D deficiency 10/31/2015   Hyperuricemia 05/08/2013   ONYCHOMYCOSIS, TOENAILS 02/22/2009   NEVI, MULTIPLE 02/22/2009   Diabetes mellitus ([place]) 01/25/2008   BACK PAIN WITH RADICULOPATHY 11/09/2007   Hyperlipidemia LDL goal <100 08/26/2007   Morbid obesity ([place]) 08/26/2007   Essential hypertension 08/26/2007   Obstructive sleep apnea 04/20/2007    Toniann Fail 09/19/2020, 10:36 AM  [place] [facility] [place], [name], [address] Phone: [phone]   Fax:  [phone]  Name: Pamela Stein MRN: [id number] Date of Birth: 03/28/1955

## 2020-09-24 ENCOUNTER — Other Ambulatory Visit: Payer: Self-pay

## 2020-09-24 ENCOUNTER — Ambulatory Visit (HOSPITAL_COMMUNITY): Payer: Federal, State, Local not specified - PPO

## 2020-09-24 ENCOUNTER — Encounter (HOSPITAL_COMMUNITY): Payer: Self-pay

## 2020-09-24 DIAGNOSIS — Z9181 History of falling: Secondary | ICD-10-CM | POA: Diagnosis not present

## 2020-09-24 DIAGNOSIS — M25562 Pain in left knee: Secondary | ICD-10-CM | POA: Diagnosis not present

## 2020-09-24 DIAGNOSIS — G8929 Other chronic pain: Secondary | ICD-10-CM | POA: Diagnosis not present

## 2020-09-24 DIAGNOSIS — M6281 Muscle weakness (generalized): Secondary | ICD-10-CM

## 2020-09-24 DIAGNOSIS — R2689 Other abnormalities of gait and mobility: Secondary | ICD-10-CM | POA: Diagnosis not present

## 2020-09-24 NOTE — Therapy (Signed)
[place], [name], [address] Phone: [phone]   Fax:  [phone]  Physical Therapy Treatment  Patient Details  Name: Pamela Stein MRN: [id number] Date of Birth: 06/05/1955 Referring Provider (PT): Arther Abbott   Encounter Date: 09/24/2020   PT End of Session - 09/24/20 1143     Visit Number 29    Number of Visits 31    Date for PT Re-Evaluation 10/11/20    Authorization Type BCBS (no auth, VL [id number] used)    Authorization - Visit Number 29    Authorization - Number of Visits 42    Progress Note Due on Visit 31    PT Start Time 1136   late arrival   PT Stop Time 1216    PT Time Calculation (min) 40 min    Equipment Utilized During Treatment --   SBA during balance activities   Activity Tolerance Patient tolerated treatment well;Patient limited by fatigue;No increased pain    Behavior During Therapy WFL for tasks assessed/performed             Past Medical History:  Diagnosis Date   Anxiety    Back pain    Bronchitis    Diabetes mellitus    Dizziness    Heart murmur    History of gout    Hyperlipidemia    Hypertension    Neuropathy    Obesity    Obstructive sleep apnea    CPAP    Past Surgical History:  Procedure Laterality Date   ABDOMINAL HYSTERECTOMY     ADENOIDECTOMY     CATARACT EXTRACTION Left    right 02/2018   COLONOSCOPY  2008   diminutive rectal polyp, s/p removal. polypoid mucosa   COLONOSCOPY WITH PROPOFOL N/A 01/03/2018   Procedure: COLONOSCOPY WITH PROPOFOL;  Surgeon: Daneil Dolin, MD;  [facility];  Service: Endoscopy;  Laterality: N/A;  12:00pm   DILATION AND CURETTAGE OF UTERUS     ENDOMETRIAL ABLATION     EYE SURGERY Bilateral 12/04/2013   cataract   PARATHYROIDECTOMY N/A 12/22/2016   Procedure: PARATHYROIDECTOMY, NECK EXPLORATION;  Surgeon: Jackolyn Confer, MD;  [facility];  Service: General;  Laterality: N/A;   TONSILLECTOMY      TOTAL KNEE ARTHROPLASTY Left 05/28/2020   Procedure: LEFT TOTAL KNEE ARTHROPLASTY;  Surgeon: Carole Civil, MD;  Location: [facility];  Service: Orthopedics;  Laterality: Left;   WOUND EXPLORATION Left 05/31/2020   Procedure: WOUND EXPLORATION AND IRRIGATION LEFT KNEE;  Surgeon: Carole Civil, MD;  Location: [facility];  Service: Orthopedics;  Laterality: Left;  irrigation wound exploration, wound closure    There were no vitals filed for this visit.   Subjective Assessment - 09/24/20 1141     Subjective Reports she did a lot of walking at birthday party over weekend, stated she was tired at entrance of therapy.  Stated she has been walking down hallway some without using SPC.  Reports she has purchased ankle weights for home use.    Pertinent History HTN, DB    Patient Stated Goals to be able to get her left leg stronger so she doesn't have to use the walker.    Currently in Pain? No/denies                               [facility] Adult PT Treatment/Exercise - 09/24/20 0001  Knee/Hip Exercises: Machines for Strengthening   Cybex Leg Press Lt only 2Pl 2x 10    Other Machine Power tower 24 degrees 2x 10      Knee/Hip Exercises: Standing   Gait Training 40f no AD, 1 LOB required min A for safety    Other Standing Knee Exercises alternating march 2x10 with 8 lbs unilateral carry; tandem stance 3x 30"    Other Standing Knee Exercises sidestepping 4x20 ft      Knee/Hip Exercises: Seated   Long Arc Quad Strengthening;2 sets;10 reps    Long Arc Quad Weight 5 lbs.    Long [employer] 5" holds, lacking extension    Sit to [employer]10 reps;without UE support   eccentric control                     PT Short Term Goals - 08/30/20 1035       PT SHORT TERM GOAL #1   Title Patient will report at least 50% improvement in overall symptoms and/or function to demonstrate improved functional mobility    Time 3    Period Weeks    Status Achieved     Target Date 07/15/20      PT SHORT TERM GOAL #2   Title Patient will demonstrate 0-120 degrees of left knee ROM    Baseline 0-122    Time 3    Period Weeks    Status Achieved    Target Date 07/15/20      PT SHORT TERM GOAL #3   Title Patient will be independent in self management strategies to improve quality of life and functional outcomes.    Time 3    Period Weeks    Status Achieved    Target Date 07/15/20      PT SHORT TERM GOAL #4   Title Demo decreased risk for falls as evidenced by score of 50/56 Berg Balance Test    Baseline 43/56    Time 3    Period Weeks    Status New    Target Date 09/20/20               PT Long Term Goals - 08/30/20 1049       PT LONG TERM GOAL #1   Title Patient will report at least 75% improvement in overall symptoms and/or function to demonstrate improved functional mobility    Time 6    Period Weeks    Status Achieved      PT LONG TERM GOAL #2   Title Patient will improve on FOTO score to meet predicted outcomes to demonstrate improved functional mobility.    Time 6    Period Weeks    Status Achieved      PT LONG TERM GOAL #3   Title Patient will be able to ambulate at least 350 feet in 2MWT with least restrictive assistive device in order to demonstrate improved gait speed for community ambulation.    Baseline 250 feet with RW    Time 6    Period Weeks    Status On-going      PT LONG TERM GOAL #4   Title Patient will be able to ambulate at least 100 feet with cane safely    Time 5    Period Weeks    Status New      PT LONG TERM GOAL #5   Title Decrease risk for falls as evidenced by score 54/56 [employer]  Baseline 43/56    Time 6    Period Weeks    Status New    Target Date 10/11/20                   Plan - 09/24/20 1205     Clinical Impression Statement Pt continues to exhibit weakness and unsteadiness upon fatigue.  Reduced weight during LAQ due to inability to extend terminal extension.   Added power tower and leg press for LE strengthening wiht good control.  Pt with 1 LOB during balance activities required min A for safety to reduce risk of fall.  Encouraged pt to continue wiht SPC for safety.    Personal Factors and Comorbidities Age;Fitness;Behavior Pattern;Past/Current Experience;Comorbidity 3+;Time since onset of injury/illness/exacerbation    Comorbidities anxiety, DM, HTN    Examination-Activity Limitations Locomotion Level;Transfers;Squat;Stairs;Stand;Lift;Bend    Examination-Participation Restrictions Cleaning;Occupation;Meal Prep;Church;Community Activity;Shop;Volunteer;Yard Work    Stability/Clinical Decision Making Stable/Uncomplicated    Designer, jewellery Low    Rehab Potential Good    PT Frequency 1x / week    PT Duration 6 weeks   5 weeks   PT Treatment/Interventions ADLs/Self Care Home Management;Aquatic Therapy;Cryotherapy;Electrical Stimulation;Iontophoresis '4mg'$ /ml Dexamethasone;Moist Heat;Traction;Ultrasound;DME Instruction;Gait training;Stair training;Functional mobility training;Therapeutic activities;Therapeutic exercise;Balance training;Neuromuscular re-education;Patient/family education;Manual techniques;Manual lymph drainage;Compression bandaging;Dry needling;Energy conservation;Spinal Manipulations;Joint Manipulations;Splinting;Taping;Passive range of motion    PT Next Visit Plan Total gym single leg press, progress quad/ gluteal strengthening and balalnce    PT Home Exercise Plan HH HEP, hamstring  stretch, HR, mini squat at counter; 6/2: Quad set, SAQ, SLR all direction, bridge every day; LAQ with contral lateral assist; walking with cane; Single leg lift-offs from kitchen sink for SLS    Consulted and Agree with Plan of Care Patient             Patient will benefit from skilled therapeutic intervention in order to improve the following deficits and impairments:  Abnormal gait, Decreased endurance, Decreased activity tolerance, Pain, Decreased  balance, Impaired flexibility, Improper body mechanics, Decreased strength, Decreased mobility, Difficulty walking, Decreased scar mobility, Decreased skin integrity  Visit Diagnosis: Chronic pain of left knee  Muscle weakness (generalized)  Other abnormalities of gait and mobility  History of falling     Problem List Patient Active Problem List   Diagnosis Date Noted   Chronic pain of left knee 05/31/2020   Wound dehiscence, surgical    Status post total left knee replacement 05/28/2020   Primary osteoarthritis of left knee    Polyneuropathy due to secondary diabetes mellitus ([place]) 05/25/2019   Arthritis of right sacroiliac joint 05/25/2019   Cervical disc disorder 05/25/2019   Falls 05/25/2019   General unsteadiness 05/25/2019   Encounter for well woman exam with routine gynecological exam 01/09/2019   Screening for colorectal cancer 01/09/2019   Encounter for screening colonoscopy 12/13/2017   Hyperparathyroidism, primary ([place]) 12/22/2016   Vitamin D deficiency 10/31/2015   Hyperuricemia 05/08/2013   ONYCHOMYCOSIS, TOENAILS 02/22/2009   NEVI, MULTIPLE 02/22/2009   Diabetes mellitus ([place]) 01/25/2008   BACK PAIN WITH RADICULOPATHY 11/09/2007   Hyperlipidemia LDL goal <100 08/26/2007   Morbid obesity ([place]) 08/26/2007   Essential hypertension 08/26/2007   Obstructive sleep apnea 04/20/2007   Ihor Austin, LPTA/CLT; CBIS [phone]  Aldona Lento 09/24/2020, 1:06 PM  [place], [name], [address] Phone: [phone]   Fax:  [phone]  Name: DANIELYS SHUMARD MRN: [id number] Date of Birth: [birth year]

## 2020-10-01 ENCOUNTER — Ambulatory Visit (HOSPITAL_COMMUNITY): Payer: Federal, State, Local not specified - PPO | Admitting: Physical Therapy

## 2020-10-08 ENCOUNTER — Ambulatory Visit (INDEPENDENT_AMBULATORY_CARE_PROVIDER_SITE_OTHER): Payer: Federal, State, Local not specified - PPO | Admitting: Adult Health

## 2020-10-08 ENCOUNTER — Other Ambulatory Visit (HOSPITAL_COMMUNITY)
Admission: RE | Admit: 2020-10-08 | Discharge: 2020-10-08 | Disposition: A | Discharge status: Home / Self Care | Payer: Federal, State, Local not specified - PPO | Source: Ambulatory Visit | Attending: Adult Health | Admitting: Adult Health

## 2020-10-08 ENCOUNTER — Encounter: Payer: Self-pay | Admitting: Adult Health

## 2020-10-08 ENCOUNTER — Other Ambulatory Visit: Payer: Self-pay

## 2020-10-08 VITALS — BP 140/82 | HR 73 | Ht 62.25 in | Wt 229.0 lb

## 2020-10-08 DIAGNOSIS — Z01419 Encounter for gynecological examination (general) (routine) without abnormal findings: Secondary | ICD-10-CM | POA: Insufficient documentation

## 2020-10-08 DIAGNOSIS — Z1211 Encounter for screening for malignant neoplasm of colon: Secondary | ICD-10-CM

## 2020-10-08 DIAGNOSIS — Z90711 Acquired absence of uterus with remaining cervical stump: Secondary | ICD-10-CM

## 2020-10-08 DIAGNOSIS — Z780 Asymptomatic menopausal state: Secondary | ICD-10-CM | POA: Diagnosis not present

## 2020-10-08 DIAGNOSIS — J209 Acute bronchitis, unspecified: Secondary | ICD-10-CM | POA: Diagnosis not present

## 2020-10-08 DIAGNOSIS — E119 Type 2 diabetes mellitus without complications: Secondary | ICD-10-CM | POA: Diagnosis not present

## 2020-10-08 DIAGNOSIS — I10 Essential (primary) hypertension: Secondary | ICD-10-CM | POA: Diagnosis not present

## 2020-10-08 LAB — HEMOCCULT GUIAC POC 1CARD (OFFICE): Fecal Occult Blood, POC: NEGATIVE

## 2020-10-08 NOTE — Progress Notes (Signed)
Patient ID: Pamela Stein, female   DOB: 1956/01/29, 65 y.o.   MRN: [id number] History of Present Illness: Pamela Stein is a 65 year old black female,single, sp [facility] in for a well woman gyn exam and pap. She had left knee replacement in April and is using a cane. PCP is Dr Legrand Rams.   Current Medications, Allergies, Past Medical History, Past Surgical History, Family History and Social History were reviewed in [employer] record.     Review of Systems: Patient denies any headaches, hearing loss, fatigue, blurred vision, shortness of breath, chest pain, abdominal pain, problems with bowel movements, urination, or intercourse.(Not active). No joint pain or mood swings.     Physical Exam:BP 140/82 (BP Location: Left Arm, Patient Position: Sitting, Cuff Size: Large)   Pulse 73   Ht 5' 2.25" (1.581 m)   Wt 229 lb (103.9 kg)   BMI 41.55 kg/m   General:  Well developed, well nourished, no acute distress Skin:  Warm and dry Neck:  Midline trachea, normal thyroid, good ROM, no lymphadenopathy,no carotid bruits heard Lungs; Clear to auscultation bilaterally Breast:  No dominant palpable mass, retraction, or nipple discharge Cardiovascular: Regular rate and rhythm Abdomen:  Soft, non tender, no hepatosplenomegaly Pelvic:  External genitalia is normal in appearance, no lesions.  The vagina is normal in appearance. Urethra has no lesions or masses. The cervix is smooth, pap with HR HPV genotyping performed.  Uterus is absent.  No adnexal masses or tenderness noted.Bladder is non tender, no masses felt. Rectal: Good sphincter tone, no polyps, or hemorrhoids felt.  Hemoccult negative. Extremities/musculoskeletal:  No swelling or varicosities noted, no clubbing or cyanosis Psych:  No mood changes, alert and cooperative,seems happy AA is 0  Fall risk is moderate Depression screen [facility] 2/9 10/08/2020 01/09/2019 05/06/2016  Decreased Interest 1 0 2  Down, Depressed, Hopeless 1 0 1   PHQ - 2 Score 2 0 3  Altered sleeping 1 - 0  Tired, decreased energy 1 - 2  Change in appetite 2 - 1  Feeling bad or failure about yourself  1 - 1  Trouble concentrating 0 - 0  Moving slowly or fidgety/restless 0 - 0  Suicidal thoughts 0 - 0  PHQ-9 Score 7 - 7  Difficult doing work/chores - - Not difficult at all  Some recent data might be hidden   She is on lexapro.  GAD 7 : Generalized Anxiety Score 10/08/2020  Nervous, Anxious, on Edge 0  Control/stop worrying 1  Worry too much - different things 1  Trouble relaxing 1  Restless 0  Easily annoyed or irritable 0  Afraid - awful might happen 1  Total GAD 7 Score 4    Upstream - 10/08/20 1055       Pregnancy Intention Screening   Does the patient want to become pregnant in the next year? N/A    Does the patient's partner want to become pregnant in the next year? N/A    Would the patient like to discuss contraceptive options today? N/A      Contraception Wrap Up   Current Method Female Sterilization   hyst   End Method Female Sterilization   hyst   Contraception Counseling Provided No            Examination chaperoned by Levy Pupa LPN     Impression and Plan: 1. Encounter for gynecological examination with Papanicolaou smear of cervix Pap sent Physical in 2 years Mammogram yearly Colonoscopy per [facility]  with PCP  2. Encounter for screening fecal occult blood testing  3. S/P abdominal supracervical subtotal hysterectomy   4. Postmenopausal Dexa scheduled for 10/17/20 at [facility] at 9 am

## 2020-10-08 NOTE — Addendum Note (Signed)
Addended by: Linton Rump on: 10/08/2020 12:03 PM   Modules accepted: Orders

## 2020-10-09 ENCOUNTER — Other Ambulatory Visit (HOSPITAL_COMMUNITY): Payer: Self-pay | Admitting: Gerontology

## 2020-10-09 ENCOUNTER — Ambulatory Visit (HOSPITAL_COMMUNITY): Payer: Federal, State, Local not specified - PPO | Admitting: Physical Therapy

## 2020-10-09 ENCOUNTER — Ambulatory Visit (HOSPITAL_COMMUNITY)
Admission: RE | Admit: 2020-10-09 | Discharge: 2020-10-09 | Disposition: A | Discharge status: Home / Self Care | Payer: Federal, State, Local not specified - PPO | Source: Ambulatory Visit | Attending: Gerontology | Admitting: Gerontology

## 2020-10-09 DIAGNOSIS — R2689 Other abnormalities of gait and mobility: Secondary | ICD-10-CM | POA: Diagnosis not present

## 2020-10-09 DIAGNOSIS — M6281 Muscle weakness (generalized): Secondary | ICD-10-CM | POA: Diagnosis not present

## 2020-10-09 DIAGNOSIS — M25562 Pain in left knee: Secondary | ICD-10-CM

## 2020-10-09 DIAGNOSIS — G8929 Other chronic pain: Secondary | ICD-10-CM | POA: Diagnosis not present

## 2020-10-09 DIAGNOSIS — R059 Cough, unspecified: Secondary | ICD-10-CM

## 2020-10-09 DIAGNOSIS — Z9181 History of falling: Secondary | ICD-10-CM | POA: Diagnosis not present

## 2020-10-09 IMAGING — DX DG CHEST 2V
2 series · 2 of 2 positions shown · non-contrast
Comparison: Comparison made with [DATE].

CLINICAL DATA: Cough in a 65-year-old female.

EXAM:
CHEST - 2 VIEW

[chest pa]
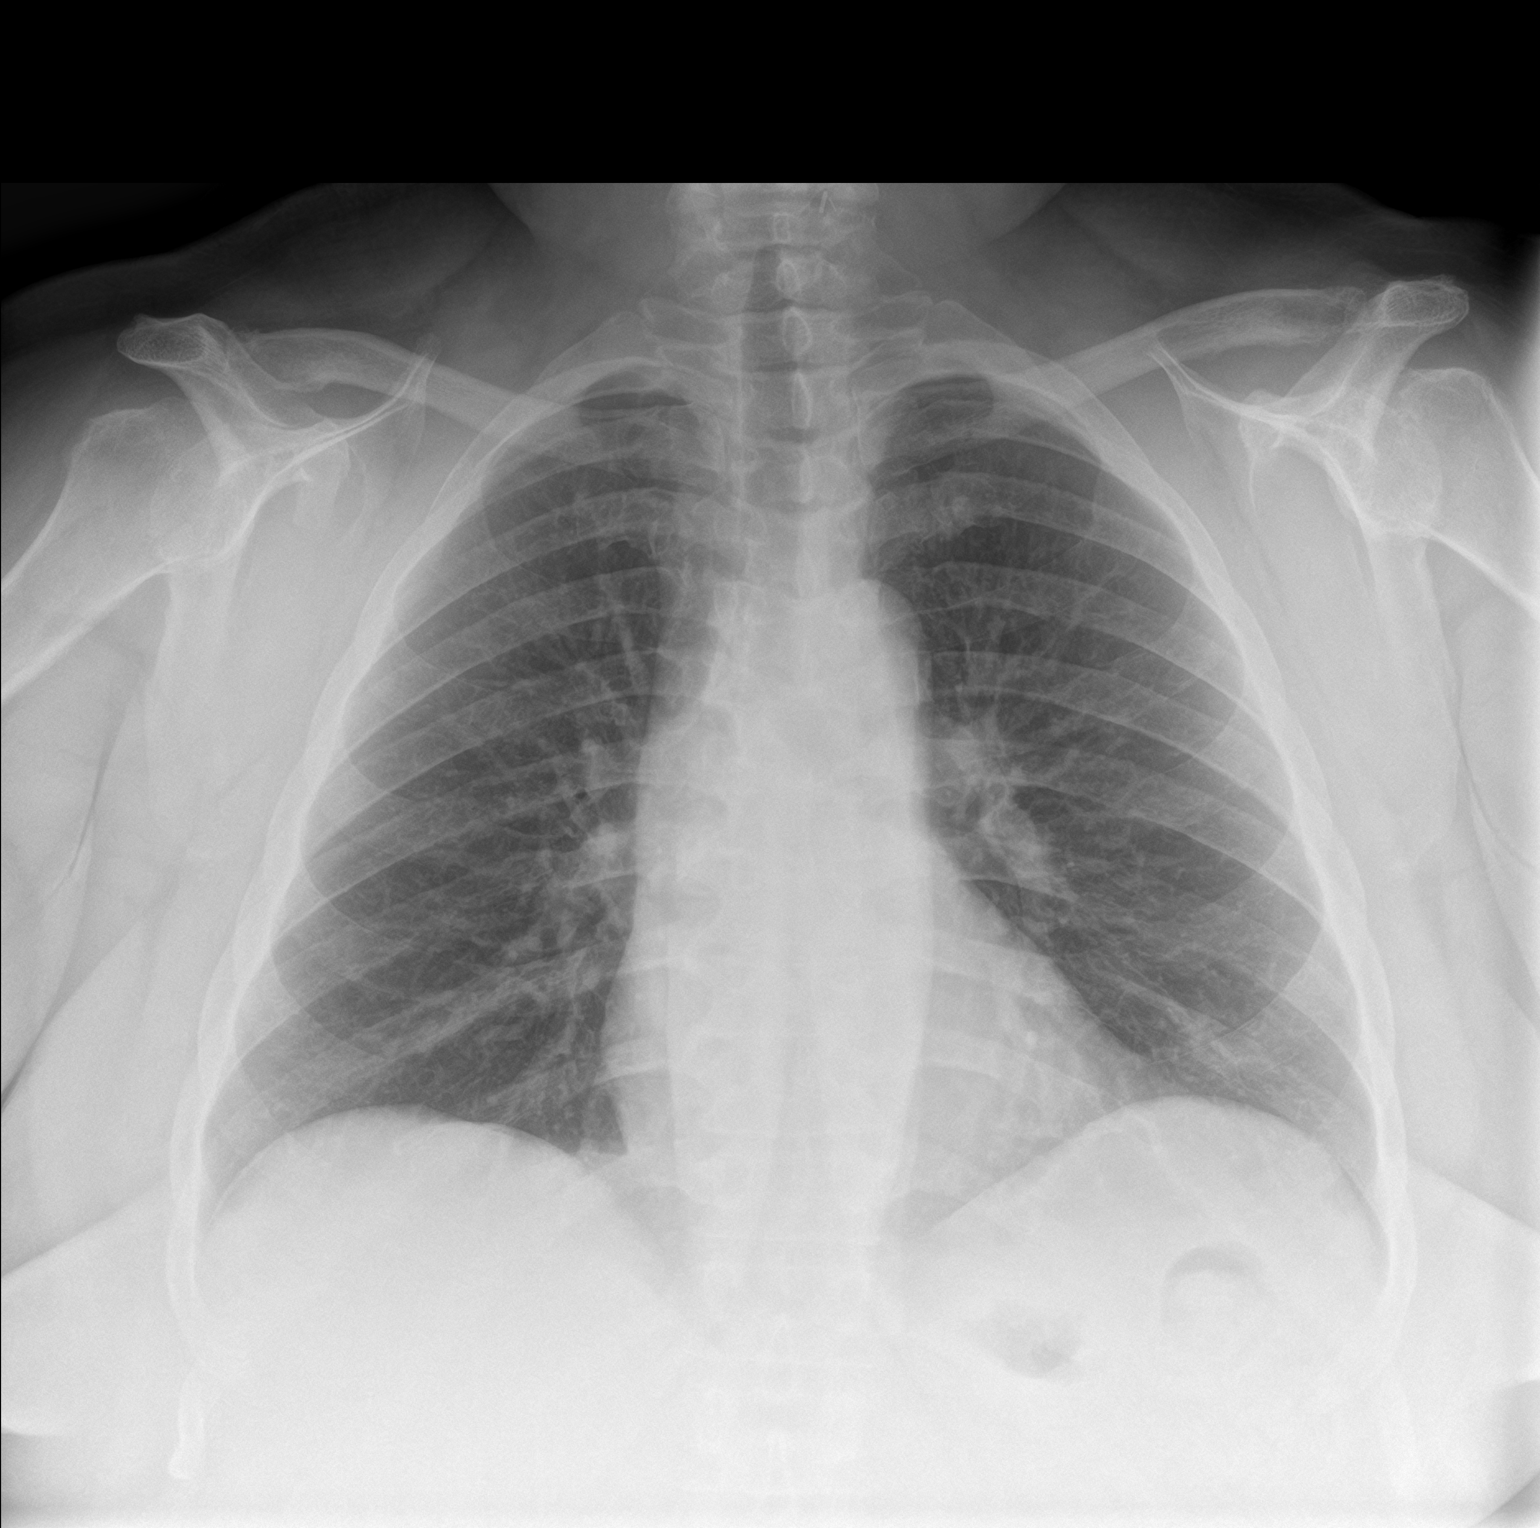

[chest lat]
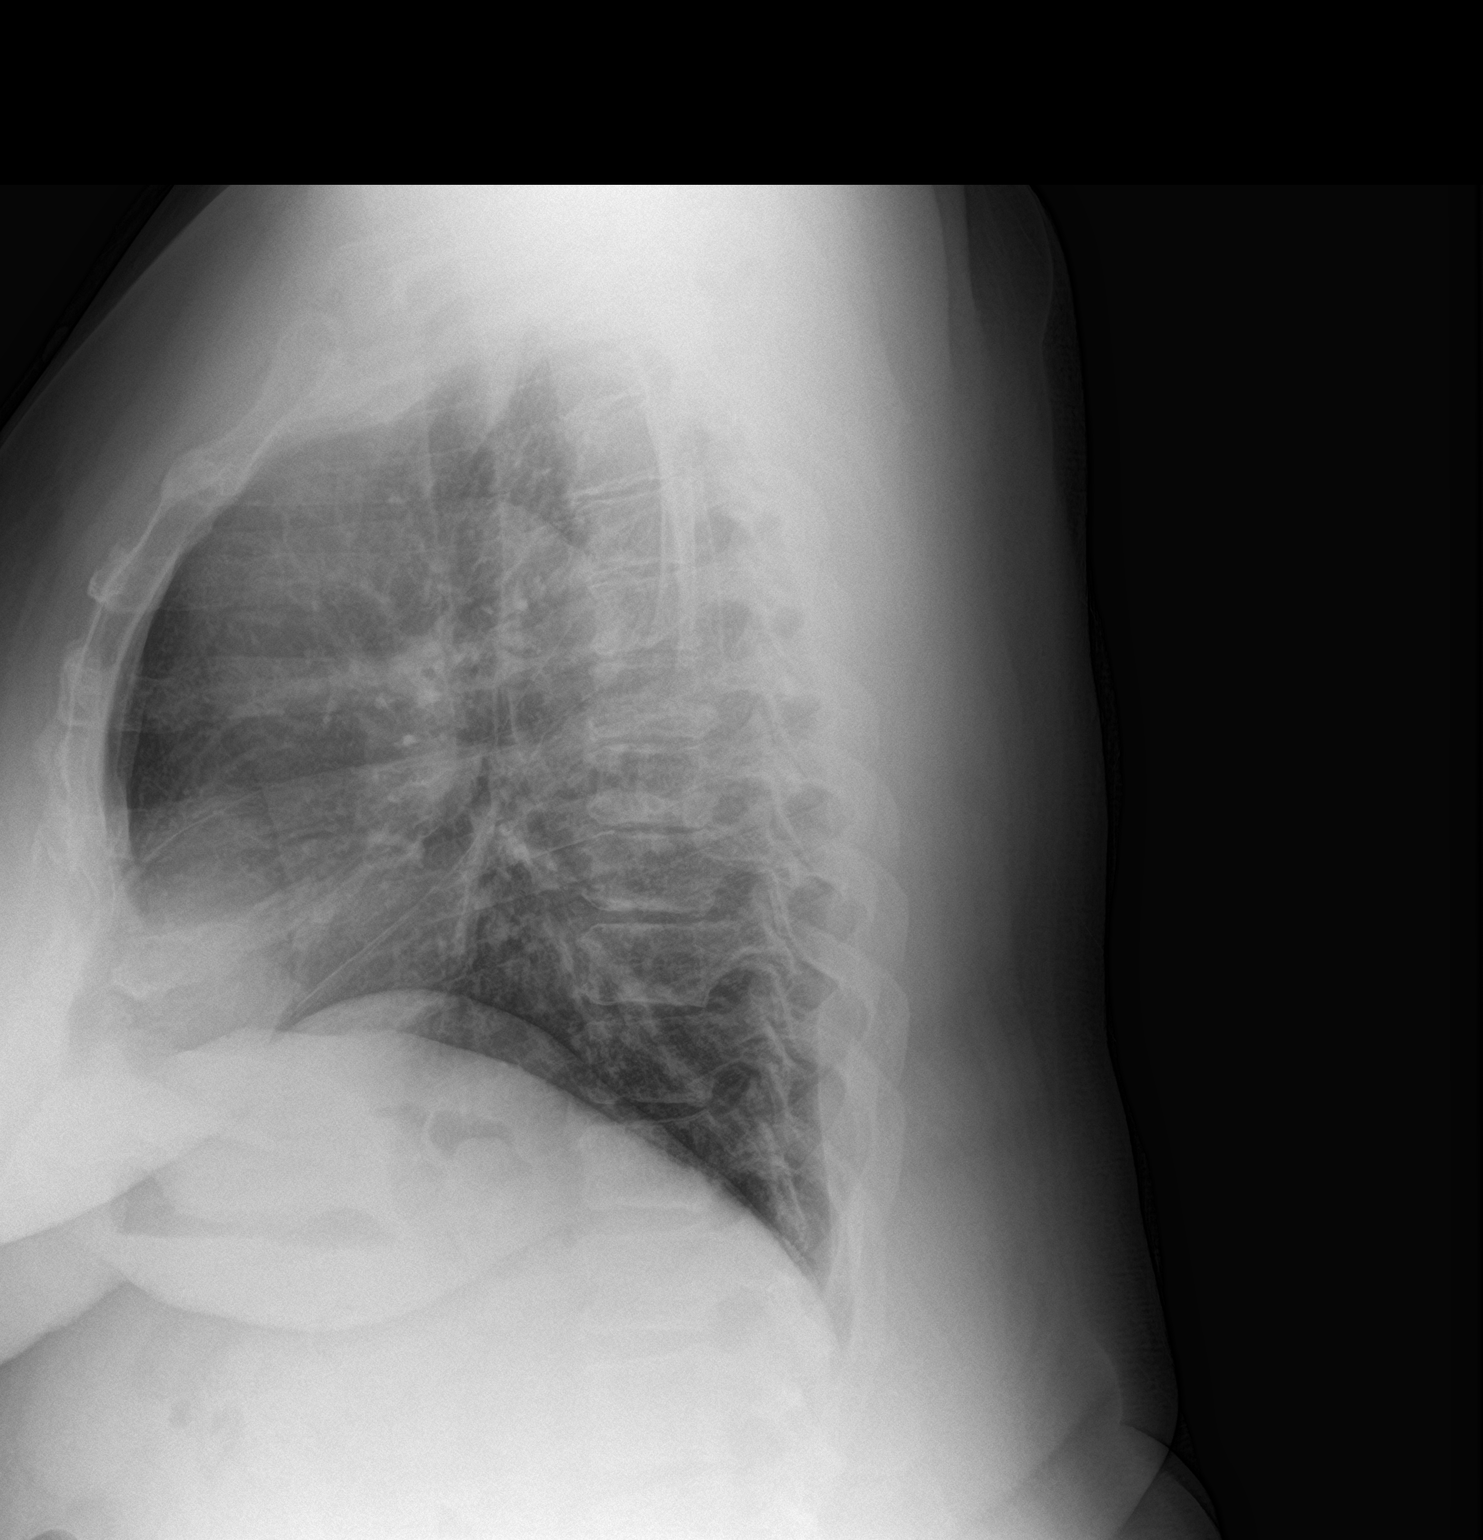

[2 of 2 positions shown; findings below may reference images not displayed]

FINDINGS: Trachea midline.

Cardiomediastinal contours and hilar structures are stable.

No sign of lobar consolidation. No sign of pleural effusion. Subtle
linear opacity at the LEFT lung base.

On limited assessment there is no acute skeletal process
IMPRESSION: Subtle linear opacity at the LEFT lung base may represent
atelectasis or developing infection. No consolidation or effusion.

## 2020-10-09 NOTE — Therapy (Addendum)
[facility] [address] [place], [name], [address] Phone: [phone]   Fax:  [phone]  Physical Therapy Treatment  and Discharge Note  Patient Details  Name: Pamela Stein MRN: [id number] Date of Birth: 24-Dec-1955 Referring Provider (PT): Arther Abbott  PHYSICAL THERAPY DISCHARGE SUMMARY  Visits from Start of Care: 30  Current functional level related to goals / functional outcomes: See below  Remaining deficits: See below   Education / Equipment: See below  Patient agrees to discharge. Patient goals were partially met. Patient is being discharged due to being pleased with the current functional level.  7:53 AM, 10/15/20 Jerene Pitch, DPT Physical Therapy with [facility]  [phone] office   Encounter Date: 10/09/2020   PT End of Session - 10/09/20 1201     Visit Number 30    Number of Visits 31    Date for PT Re-Evaluation 10/11/20    Authorization Type BC[employer] (no auth, [id number])    Authorization - Visit Number 30    Authorization - Number of Visits 42    Progress Note Due on Visit 31    PT Start Time 1008    PT Stop Time 1045    PT Time Calculation (min) 37 min    Equipment Utilized During Treatment --   SBA during balance activities   Activity Tolerance Patient tolerated treatment well;Patient limited by fatigue;No increased pain    Behavior During Therapy WFL for tasks assessed/performed             Past Medical History:  Diagnosis Date   Anxiety    Back pain    Bronchitis    Diabetes mellitus    Dizziness    Heart murmur    History of gout    Hyperlipidemia    Hypertension    Neuropathy    Obesity    Obstructive sleep apnea    CPAP    Past Surgical History:  Procedure Laterality Date   ABDOMINAL HYSTERECTOMY     [facility]   ADENOIDECTOMY     CATARACT EXTRACTION Left    right 02/2018   COLONOSCOPY  2008   diminutive rectal polyp, s/p removal. polypoid  mucosa   COLONOSCOPY WITH PROPOFOL N/A 01/03/2018   Procedure: COLONOSCOPY WITH PROPOFOL;  Surgeon: Daneil Dolin, MD;  Location: [facility];  Service: Endoscopy;  Laterality: N/A;  12:00pm   DILATION AND CURETTAGE OF UTERUS     ENDOMETRIAL ABLATION     EYE SURGERY Bilateral 12/04/2013   cataract   PARATHYROIDECTOMY N/A 12/22/2016   Procedure: PARATHYROIDECTOMY, NECK EXPLORATION;  Surgeon: Jackolyn Confer, MD;  Location: [facility];  Service: General;  Laterality: N/A;   TONSILLECTOMY     TOTAL KNEE ARTHROPLASTY Left 05/28/2020   Procedure: LEFT TOTAL KNEE ARTHROPLASTY;  Surgeon: Carole Civil, MD;  Location: [facility];  Service: Orthopedics;  Laterality: Left;   WOUND EXPLORATION Left 05/31/2020   Procedure: WOUND EXPLORATION AND IRRIGATION LEFT KNEE;  Surgeon: Carole Civil, MD;  Location: [facility];  Service: Orthopedics;  Laterality: Left;  irrigation wound exploration, wound closure    There were no vitals filed for this visit.   Subjective Assessment - 10/09/20 1013     Subjective Pt states she has been going to the [facility] and has been getting on the nustep for 30 minutes. STates she had stopped doing her HEP as she didn't know she needed to continue. Pt reports no pain in her  knee but feels sore and heavy all the time.    Currently in Pain? No/denies                [facility] PT Assessment - 10/09/20 1017       Assessment   Medical Diagnosis L TKA    Referring Provider (PT) Arther Abbott    Next MD Visit 11/07/2020      Observation/Other Assessments   Focus on Therapeutic Outcomes (FOTO)  64% functional status   was 52% functional status on 6/17     AROM   Left Knee Extension 2   was lacking 2 degrees   Left Knee Flexion 124   was 122 degrees     Strength   Left Knee Flexion 5/5   was 4+/5   Left Knee Extension 4+/5   was 4/5     Ambulation/Gait   Ambulation/Gait Yes    Ambulation/Gait Assistance 6: Modified independent (Device/Increase time)     Gait Pattern Step-through pattern    Gait Comments 2MWT      Standardized Balance Assessment   Standardized Balance Assessment Berg Balance Test      Berg Balance Test   Sit to Stand Able to stand without using hands and stabilize independently    Standing Unsupported Able to stand safely 2 minutes    Sitting with Back Unsupported but Feet Supported on Floor or Stool Able to sit safely and securely 2 minutes    Stand to Sit Sits safely with minimal use of hands    Transfers Able to transfer safely, minor use of hands    Standing Unsupported with Eyes Closed Able to stand 10 seconds safely    Standing Unsupported with Feet Together Able to place feet together independently and stand 1 minute safely    From Standing, Reach Forward with Outstretched Arm Can reach confidently >25 cm (10")    From Standing Position, Pick up Object from Floor Able to pick up shoe safely and easily    From Standing Position, Turn to Look Behind Over each Shoulder Looks behind from both sides and weight shifts well    Turn 360 Degrees Able to turn 360 degrees safely one side only in 4 seconds or less    Standing Unsupported, Alternately Place Feet on Step/Stool Able to stand independently and safely and complete 8 steps in 20 seconds    Standing Unsupported, One Foot in Front Able to take small step independently and hold 30 seconds    Standing on One Leg Able to lift leg independently and hold 5-10 seconds    Total Score 52    Berg comment: was 73                           [facility] Adult PT Treatment/Exercise - 10/09/20 1017       Ambulation/Gait   Ambulation Distance (Feet) 296 Feet   was 240 with RW   Assistive device None                      PT Short Term Goals - 10/09/20 1033       PT SHORT TERM GOAL #1   Title Patient will report at least 50% improvement in overall symptoms and/or function to demonstrate improved functional mobility    Time 3    Period Weeks    Status  Achieved    Target Date 07/15/20      PT SHORT  TERM GOAL #2   Title Patient will demonstrate 0-120 degrees of left knee ROM    Baseline 0-122    Time 3    Period Weeks    Status Achieved    Target Date 07/15/20      PT SHORT TERM GOAL #3   Title Patient will be independent in self management strategies to improve quality of life and functional outcomes.    Time 3    Period Weeks    Status Achieved    Target Date 07/15/20      PT SHORT TERM GOAL #4   Title Demo decreased risk for falls as evidenced by score of 50/56 Berg Balance Test    Baseline 43/56    Time 3    Period Weeks    Status Achieved    Target Date 09/20/20               PT Long Term Goals - 10/09/20 1033       PT LONG TERM GOAL #1   Title Patient will report at least 75% improvement in overall symptoms and/or function to demonstrate improved functional mobility    Time 6    Period Weeks    Status Achieved      PT LONG TERM GOAL #2   Title Patient will improve on FOTO score to meet predicted outcomes to demonstrate improved functional mobility.    Time 6    Period Weeks    Status Achieved      PT LONG TERM GOAL #3   Title Patient will be able to ambulate at least 350 feet in 2MWT with least restrictive assistive device in order to demonstrate improved gait speed for community ambulation.    Baseline 250 feet with RW    8/31:296 feet    Time 6    Period Weeks    Status Not Met      PT LONG TERM GOAL #4   Title Patient will be able to ambulate at least 100 feet with cane safely    Time 5    Period Weeks    Status Achieved      PT LONG TERM GOAL #5   Title Decrease risk for falls as evidenced by score 54/56 Berg Balance Test    Baseline 43/56  8/31: 52/56    Time 6    Period Weeks    Status Not Met                   Plan - 10/09/20 1159     Clinical Impression Statement Pt has completed 30 PT visits for Lt TKA completed on 05/28/2020.  Pt has made excellent progress with increase  in strength and ROM 2-124.  Pt has met all short term goals and 3/5 long term goals.  Pt is now ambulating community distances using a SPC and has improved functional outcome measure to 64%.  Pt is eager to get back to work and has resumed participation at the [employer].  Pt instructed to continue her HEP for increasing her LE strength and to progress these at the center as well.  Pt is agreeable to discharge at this time.    Personal Factors and Comorbidities Age;Fitness;Behavior Pattern;Past/Current Experience;Comorbidity 3+;Time since onset of injury/illness/exacerbation    Comorbidities anxiety, DM, HTN    Examination-Activity Limitations Locomotion Level;Transfers;Squat;Stairs;Stand;Lift;Bend    Examination-Participation Restrictions Cleaning;Occupation;Meal Prep;Church;Community Activity;Shop;Volunteer;Yard Work    Stability/Clinical Decision Making Stable/Uncomplicated    Rehab Potential Good  PT Frequency 1x / week    PT Duration 6 weeks   5 weeks   PT Treatment/Interventions ADLs/Self Care Home Management;Aquatic Therapy;Cryotherapy;Electrical Stimulation;Iontophoresis 30m/ml Dexamethasone;Moist Heat;Traction;Ultrasound;DME Instruction;Gait training;Stair training;Functional mobility training;Therapeutic activities;Therapeutic exercise;Balance training;Neuromuscular re-education;Patient/family education;Manual techniques;Manual lymph drainage;Compression bandaging;Dry needling;Energy conservation;Spinal Manipulations;Joint Manipulations;Splinting;Taping;Passive range of motion    PT Next Visit Plan discharge to HEP.    PT Home Exercise Plan HH HEP, hamstring  stretch, HR, mini squat at counter; 6/2: Quad set, SAQ, SLR all direction, bridge every day; LAQ with contral lateral assist; walking with cane; Single leg lift-offs from kitchen sink for SLS    Consulted and Agree with Plan of Care Patient             Patient will benefit from skilled therapeutic intervention in order to  improve the following deficits and impairments:  Abnormal gait, Decreased endurance, Decreased activity tolerance, Pain, Decreased balance, Impaired flexibility, Improper body mechanics, Decreased strength, Decreased mobility, Difficulty walking, Decreased scar mobility, Decreased skin integrity  Visit Diagnosis: Chronic pain of left knee  Muscle weakness (generalized)  Other abnormalities of gait and mobility     Problem List Patient Active Problem List   Diagnosis Date Noted   S/P abdominal supracervical subtotal hysterectomy 10/08/2020   Encounter for screening fecal occult blood testing 10/08/2020   Encounter for gynecological examination with Papanicolaou smear of cervix 10/08/2020   Chronic pain of left knee 05/31/2020   Wound dehiscence, surgical    Status post total left knee replacement 05/28/2020   Primary osteoarthritis of left knee    Polyneuropathy due to secondary diabetes mellitus ([place] 05/25/2019   Arthritis of right sacroiliac joint 05/25/2019   Cervical disc disorder 05/25/2019   Falls 05/25/2019   General unsteadiness 05/25/2019   Encounter for well woman exam with routine gynecological exam 01/09/2019   Screening for colorectal cancer 01/09/2019   Encounter for screening colonoscopy 12/13/2017   Hyperparathyroidism, primary ([place] 12/22/2016   Vitamin D deficiency 10/31/2015   Hyperuricemia 05/08/2013   ONYCHOMYCOSIS, TOENAILS 02/22/2009   NEVI, MULTIPLE 02/22/2009   Diabetes mellitus ([place] 01/25/2008   BACK PAIN WITH RADICULOPATHY 11/09/2007   Hyperlipidemia LDL goal <100 08/26/2007   Morbid obesity ([place] 08/26/2007   Essential hypertension 08/26/2007   Obstructive sleep apnea 04/20/2007   ATeena Irani PTA/CLT [phone] FTeena Irani8/31/2022, 12:03 PM  [facility] [address]Phone: [phone]  Fax:  [phone] Name: Pamela LANIGANMRN: [id number] of  Birth: [birth year]

## 2020-10-10 LAB — CYTOLOGY - PAP
Comment: NEGATIVE
Diagnosis: NEGATIVE
High risk HPV: NEGATIVE

## 2020-10-16 ENCOUNTER — Encounter (HOSPITAL_COMMUNITY): Payer: Federal, State, Local not specified - PPO | Admitting: Physical Therapy

## 2020-10-17 ENCOUNTER — Inpatient Hospital Stay (HOSPITAL_COMMUNITY): Admission: RE | Admit: 2020-10-17 | Payer: Federal, State, Local not specified - PPO | Source: Ambulatory Visit

## 2020-10-21 ENCOUNTER — Encounter: Payer: Self-pay | Admitting: Orthopedic Surgery

## 2020-11-07 ENCOUNTER — Encounter: Payer: Self-pay | Admitting: Orthopedic Surgery

## 2020-11-07 ENCOUNTER — Ambulatory Visit: Payer: Federal, State, Local not specified - PPO

## 2020-11-07 ENCOUNTER — Ambulatory Visit (INDEPENDENT_AMBULATORY_CARE_PROVIDER_SITE_OTHER): Payer: Federal, State, Local not specified - PPO | Admitting: Orthopedic Surgery

## 2020-11-07 ENCOUNTER — Other Ambulatory Visit: Payer: Self-pay

## 2020-11-07 VITALS — BP 169/90 | HR 79 | Ht 62.5 in | Wt 224.0 lb

## 2020-11-07 DIAGNOSIS — Z96652 Presence of left artificial knee joint: Secondary | ICD-10-CM

## 2020-11-07 DIAGNOSIS — M1712 Unilateral primary osteoarthritis, left knee: Secondary | ICD-10-CM

## 2020-11-07 DIAGNOSIS — M25462 Effusion, left knee: Secondary | ICD-10-CM | POA: Diagnosis not present

## 2020-11-07 NOTE — Progress Notes (Signed)
Chief Complaint  Patient presents with   Post-op Follow-up    05/28/20 left total knee and 4/22 wound closure; states pain and feels warm "heat" at times    65 year old female status post left total knee on May 28, 2020.  She fell in the hospital opened up her wound was taken back for irrigation and wound closure.  Her postop course was marked by extensor lag from quadriceps weakness after the second surgery  She went to physical therapy and has progressively but slowly improved her extension  She presents today with pain heat and swelling in the left knee  Examination reveals that she walks with a cane she uses that sparingly  Her active range of motion is 10 to 125 degrees passive range of motion 0 to 125 degrees.  She has a mild extensor lag.  She is very stable in extension has a trace positive drawer and flexion  She has an effusion  X-ray  We will aspirated the knee and sent for appropriate labs and then order appropriate blood work as well  Follow-up in 2 weeks  Left knee was aspirated 20 cc of fluid 10 was blood and 10 was amber-colored.  Sent for culture and sent for fluid analysis  CBC sed rate C-reactive protein also ordered

## 2020-11-08 LAB — CBC WITH DIFFERENTIAL/PLATELET
Absolute Monocytes: 732 cells/uL (ref 200–950)
Basophils Absolute: 57 cells/uL (ref 0–200)
Basophils Relative: 0.6 %
Eosinophils Absolute: 181 cells/uL (ref 15–500)
Eosinophils Relative: 1.9 %
HCT: 42.6 % (ref 35.0–45.0)
Hemoglobin: 12.8 g/dL (ref 11.7–15.5)
Lymphs Abs: 3097 cells/uL (ref 850–3900)
MCH: 20.9 pg — ABNORMAL LOW (ref 27.0–33.0)
MCHC: 30 g/dL — ABNORMAL LOW (ref 32.0–36.0)
MCV: 69.6 fL — ABNORMAL LOW (ref 80.0–100.0)
MPV: 10.7 fL (ref 7.5–12.5)
Monocytes Relative: 7.7 %
Neutro Abs: 5434 cells/uL (ref 1500–7800)
Neutrophils Relative %: 57.2 %
Platelets: 350 10*3/uL (ref 140–400)
RBC: 6.12 10*6/uL — ABNORMAL HIGH (ref 3.80–5.10)
RDW: 18.3 % — ABNORMAL HIGH (ref 11.0–15.0)
Total Lymphocyte: 32.6 %
WBC: 9.5 10*3/uL (ref 3.8–10.8)

## 2020-11-08 LAB — CBC MORPHOLOGY

## 2020-11-08 LAB — C-REACTIVE PROTEIN: CRP: 1.7 mg/L (ref <8.0)

## 2020-11-08 LAB — SEDIMENTATION RATE: Sed Rate: 11 mm/h (ref 0–30)

## 2020-11-12 ENCOUNTER — Telehealth: Payer: Self-pay | Admitting: Orthopedic Surgery

## 2020-11-12 LAB — SYNOVIAL FLUID ANALYSIS, COMPLETE
Basophils, %: 0 %
Eosinophils-Synovial: 0 % (ref 0–2)
Lymphocytes-Synovial Fld: 75 % — ABNORMAL HIGH (ref 0–74)
Monocyte/Macrophage: 19 % (ref 0–69)
Neutrophil, Synovial: 6 % (ref 0–24)
Synoviocytes, %: 0 % (ref 0–15)
WBC, Synovial: 1061 cells/uL — ABNORMAL HIGH (ref <150)

## 2020-11-12 LAB — WOUND CULTURE
GRAM STAIN:: NONE SEEN
MICRO NUMBER:: 12440208
RESULT:: NO GROWTH
SPECIMEN QUALITY:: ADEQUATE

## 2020-11-12 NOTE — Telephone Encounter (Signed)
Patient called, aware of appointment scheduled for 11/21/20 for follow up and review of results; however, asking if Amy can please call her.

## 2020-11-13 ENCOUNTER — Telehealth: Payer: Self-pay | Admitting: Orthopedic Surgery

## 2020-11-13 NOTE — Telephone Encounter (Signed)
Call back from patient per Amy's call and message to her regarding labs. Patient voices understanding.  States her knee is about the same - said still warm to touch and some swelling. Aware of next scheduled visit on 11/21/20. Please advise.

## 2020-11-13 NOTE — Telephone Encounter (Signed)
Left message told her labs good she can call back if she has questions.

## 2020-11-18 DIAGNOSIS — E119 Type 2 diabetes mellitus without complications: Secondary | ICD-10-CM | POA: Diagnosis not present

## 2020-11-18 DIAGNOSIS — I10 Essential (primary) hypertension: Secondary | ICD-10-CM | POA: Diagnosis not present

## 2020-11-21 ENCOUNTER — Encounter: Payer: Self-pay | Admitting: Orthopedic Surgery

## 2020-11-21 ENCOUNTER — Ambulatory Visit (INDEPENDENT_AMBULATORY_CARE_PROVIDER_SITE_OTHER): Payer: Federal, State, Local not specified - PPO | Admitting: Orthopedic Surgery

## 2020-11-21 ENCOUNTER — Other Ambulatory Visit: Payer: Self-pay

## 2020-11-21 DIAGNOSIS — M25362 Other instability, left knee: Secondary | ICD-10-CM

## 2020-11-21 DIAGNOSIS — M25562 Pain in left knee: Secondary | ICD-10-CM

## 2020-11-21 DIAGNOSIS — Z96652 Presence of left artificial knee joint: Secondary | ICD-10-CM | POA: Diagnosis not present

## 2020-11-21 DIAGNOSIS — M25462 Effusion, left knee: Secondary | ICD-10-CM

## 2020-11-21 MED ORDER — MELOXICAM 7.5 MG PO TABS: 7.5000 mg | ORAL_TABLET | Freq: Two times a day (BID) | ORAL | 0 refills | Status: DC

## 2020-11-21 NOTE — Progress Notes (Signed)
FOLLOW UP   Encounter Diagnoses  Name Primary?   Status post total left knee replacement 05/28/20 Yes   Effusion, left knee    Acute pain of left knee    Other instability, left knee      Chief Complaint  Patient presents with   Post-op Follow-up    05/28/20 left TKR     Laboratory work was performed to rule out for septic knee status post total knee.  Lab results as follows:  Her white count was 9 her synovial fluid was white cells 1000 neutrophils 6 no crystals fluid culture negative  Sed rate and C-reactive protein were normal  C-reactive protein 1.7 sed rate was 11   Meds ordered this encounter  Medications   meloxicam (MOBIC) 7.5 MG tablet    Sig: Take 1 tablet (7.5 mg total) by mouth 2 (two) times daily.    Dispense:  60 tablet    Refill:  0

## 2020-11-25 ENCOUNTER — Telehealth: Payer: Self-pay | Admitting: Radiology

## 2020-11-25 NOTE — Telephone Encounter (Signed)
Left message for her, to advise ice rest and let us know if she needs to be seen sooner, if she has any increased pain  To you FYI

## 2020-11-25 NOTE — Telephone Encounter (Signed)
  Patient stated on VM that she fell at 725 am on Sunday morning and "fell on her boo boo".  She asked that we let Dwyne Hasegawa know.  She did not bruise or hurt her leg in any way she said.  VM 1036am today.

## 2020-11-27 DIAGNOSIS — E785 Hyperlipidemia, unspecified: Secondary | ICD-10-CM | POA: Diagnosis not present

## 2020-11-27 DIAGNOSIS — I10 Essential (primary) hypertension: Secondary | ICD-10-CM | POA: Diagnosis not present

## 2020-11-27 DIAGNOSIS — E11641 Type 2 diabetes mellitus with hypoglycemia with coma: Secondary | ICD-10-CM | POA: Diagnosis not present

## 2020-11-27 DIAGNOSIS — E1165 Type 2 diabetes mellitus with hyperglycemia: Secondary | ICD-10-CM | POA: Diagnosis not present

## 2020-11-27 DIAGNOSIS — Z0001 Encounter for general adult medical examination with abnormal findings: Secondary | ICD-10-CM | POA: Diagnosis not present

## 2020-11-27 DIAGNOSIS — Z23 Encounter for immunization: Secondary | ICD-10-CM | POA: Diagnosis not present

## 2020-12-09 ENCOUNTER — Encounter: Payer: Self-pay | Admitting: Orthopedic Surgery

## 2020-12-18 ENCOUNTER — Other Ambulatory Visit: Payer: Self-pay | Admitting: Orthopedic Surgery

## 2020-12-18 DIAGNOSIS — Z96652 Presence of left artificial knee joint: Secondary | ICD-10-CM

## 2020-12-18 DIAGNOSIS — M25462 Effusion, left knee: Secondary | ICD-10-CM

## 2020-12-18 DIAGNOSIS — M25562 Pain in left knee: Secondary | ICD-10-CM

## 2020-12-19 ENCOUNTER — Telehealth: Payer: Self-pay | Admitting: Orthopedic Surgery

## 2020-12-19 ENCOUNTER — Encounter: Payer: Self-pay | Admitting: Orthopedic Surgery

## 2020-12-19 ENCOUNTER — Other Ambulatory Visit: Payer: Self-pay

## 2020-12-19 ENCOUNTER — Ambulatory Visit (INDEPENDENT_AMBULATORY_CARE_PROVIDER_SITE_OTHER): Payer: Federal, State, Local not specified - PPO | Admitting: Orthopedic Surgery

## 2020-12-19 VITALS — BP 147/90 | HR 90 | Ht 62.5 in | Wt 224.0 lb

## 2020-12-19 DIAGNOSIS — Z96652 Presence of left artificial knee joint: Secondary | ICD-10-CM

## 2020-12-19 NOTE — Patient Instructions (Signed)
RTW  NOV 21 3RD SHIFT   PARK AT BACK DOOR CANE  30 LB LIFT RESTRICTION

## 2020-12-19 NOTE — Progress Notes (Signed)
Chief Complaint  Patient presents with   Post-op Follow-up    Left knee replacement 05/28/20 states feels stiff with weather changes    Pamela Stein is status post left total knee in April 4255 complicated by fall in the hospital which opened up the wound and required reoperation for I&D and closure  She experienced extensive postop weakness of the quadricep muscle which required excessive physical therapy  She presented to [place] with an effusion had an aspiration which was culture negative and showed no infectious markers her blood work was normal  Examination shows that the effusion has a resolved.  She has some mild swelling but is very minimal.  She is walking with a cane has good stability to the knee has a good range of motion  She would like to return to work with a 30 pound lifting restriction and the ability to park near the dock she wants to use her cane and she is going to get a over-the-counter knee sleeve I will see her in April for her follow-up 9-year

## 2020-12-19 NOTE — Telephone Encounter (Signed)
Work note issued per today's visit with Dr Aline Brochure accordingly

## 2020-12-19 NOTE — Telephone Encounter (Signed)
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 Routing to our Engineer, building services to advise regarding release of information due to privacy policy.

## 2020-12-20 NOTE — Telephone Encounter (Signed)
Called patient back; relayed; understands, and can come back in to sign release accordingly. Michela Pitcher will let us know Monday when her direct manager is back as to what is needed.

## 2021-01-17 ENCOUNTER — Other Ambulatory Visit: Payer: Self-pay | Admitting: Orthopedic Surgery

## 2021-01-17 DIAGNOSIS — Z96652 Presence of left artificial knee joint: Secondary | ICD-10-CM

## 2021-01-17 DIAGNOSIS — M25562 Pain in left knee: Secondary | ICD-10-CM

## 2021-01-17 DIAGNOSIS — M25462 Effusion, left knee: Secondary | ICD-10-CM

## 2021-02-23 ENCOUNTER — Encounter: Payer: Self-pay | Admitting: Emergency Medicine

## 2021-02-23 ENCOUNTER — Other Ambulatory Visit: Payer: Self-pay

## 2021-02-23 ENCOUNTER — Ambulatory Visit
Admission: EM | Admit: 2021-02-23 | Discharge: 2021-02-23 | Disposition: A | Discharge status: Home / Self Care | Payer: Federal, State, Local not specified - PPO | Attending: Family Medicine | Admitting: Family Medicine

## 2021-02-23 DIAGNOSIS — J22 Unspecified acute lower respiratory infection: Secondary | ICD-10-CM | POA: Diagnosis not present

## 2021-02-23 MED ORDER — PREDNISONE 20 MG PO TABS: 40.0000 mg | ORAL_TABLET | Freq: Every day | ORAL | 0 refills | Status: DC

## 2021-02-23 MED ORDER — DOXYCYCLINE HYCLATE 100 MG PO CAPS: 100.0000 mg | ORAL_CAPSULE | Freq: Two times a day (BID) | ORAL | 0 refills | Status: DC

## 2021-02-23 MED ORDER — PROMETHAZINE-DM 6.25-15 MG/5ML PO SYRP: 5.0000 mL | ORAL_SOLUTION | Freq: Four times a day (QID) | ORAL | 0 refills | Status: DC | PRN

## 2021-02-23 NOTE — ED Provider Notes (Signed)
[facility]    CSN: [id number] Arrival date & time: 02/23/21  1033      History   Chief Complaint Chief Complaint  Patient presents with   Cough    HPI Pamela Stein is a 66 y.o. female.   Patient resenting today with over 1 week history of cough productive of thick sputum, sinus pain and pressure, thick nasal congestion, sinus headache, wheezing, chest tightness, shortness of breath that feels like when she has had pneumonia in the past.  She denies known fever, chest pain, abdominal pain, nausea vomiting or diarrhea.  She is so far been taking Mucinex, cough syrup with minimal relief.  History of bronchitis and pneumonia.  Has an inhaler at home but has not needed to use it since onset of symptoms.  No known sick contacts recently.   Past Medical History:  Diagnosis Date   Anxiety    Back pain    Bronchitis    Diabetes mellitus    Dizziness    Heart murmur    History of gout    Hyperlipidemia    Hypertension    Neuropathy    Obesity    Obstructive sleep apnea    CPAP    Patient Active Problem List   Diagnosis Date Noted   S/P abdominal supracervical subtotal hysterectomy 10/08/2020   Encounter for screening fecal occult blood testing 10/08/2020   Encounter for gynecological examination with Papanicolaou smear of cervix 10/08/2020   Chronic pain of left knee 05/31/2020   Wound dehiscence, surgical    Status post total left knee replacement 05/28/2020   Primary osteoarthritis of left knee    Polyneuropathy due to secondary diabetes mellitus ([place]) 05/25/2019   Arthritis of right sacroiliac joint 05/25/2019   Cervical disc disorder 05/25/2019   Falls 05/25/2019   General unsteadiness 05/25/2019   Encounter for well woman exam with routine gynecological exam 01/09/2019   Screening for colorectal cancer 01/09/2019   Encounter for screening colonoscopy 12/13/2017   Hyperparathyroidism, primary ([place]) 12/22/2016   Vitamin D deficiency 10/31/2015    Hyperuricemia 05/08/2013   ONYCHOMYCOSIS, TOENAILS 02/22/2009   NEVI, MULTIPLE 02/22/2009   Diabetes mellitus ([place]) 01/25/2008   BACK PAIN WITH RADICULOPATHY 11/09/2007   Hyperlipidemia LDL goal <100 08/26/2007   Morbid obesity ([place]) 08/26/2007   Essential hypertension 08/26/2007   Obstructive sleep apnea 04/20/2007    Past Surgical History:  Procedure Laterality Date   ABDOMINAL HYSTERECTOMY     [facility]   ADENOIDECTOMY     CATARACT EXTRACTION Left    right 02/2018   COLONOSCOPY  2008   diminutive rectal polyp, s/p removal. polypoid mucosa   COLONOSCOPY WITH PROPOFOL N/A 01/03/2018   Procedure: COLONOSCOPY WITH PROPOFOL;  Surgeon: Daneil Dolin, MD;  Location: [facility];  Service: Endoscopy;  Laterality: N/A;  12:00pm   DILATION AND CURETTAGE OF UTERUS     ENDOMETRIAL ABLATION     EYE SURGERY Bilateral 12/04/2013   cataract   PARATHYROIDECTOMY N/A 12/22/2016   Procedure: PARATHYROIDECTOMY, NECK EXPLORATION;  Surgeon: Jackolyn Confer, MD;  Location: [facility];  Service: General;  Laterality: N/A;   TONSILLECTOMY     TOTAL KNEE ARTHROPLASTY Left 05/28/2020   Procedure: LEFT TOTAL KNEE ARTHROPLASTY;  Surgeon: Carole Civil, MD;  Location: [facility];  Service: Orthopedics;  Laterality: Left;   WOUND EXPLORATION Left 05/31/2020   Procedure: WOUND EXPLORATION AND IRRIGATION LEFT KNEE;  Surgeon: Carole Civil, MD;  Location: [facility];  Service: Orthopedics;  Laterality:  Left;  irrigation wound exploration, wound closure   OB History     Gravida  1   Para      Term      Preterm      AB  1   Living         SAB  1   IAB      Ectopic      Multiple      Live Births              Home Medications    Prior to Admission medications   Medication Sig Start Date End Date Taking? Authorizing Provider  albuterol (PROVENTIL HFA;VENTOLIN HFA) 108 (90 Base) MCG/ACT inhaler Inhale 1-2 puffs into the lungs every 6 (six) hours as needed for wheezing or shortness of  breath. 05/20/16  Yes Marney Setting, NP  aspirin 81 MG chewable tablet Chew 1 tablet (81 mg total) by mouth 2 (two) times daily. 05/30/20  Yes Carole Civil, MD  doxycycline (VIBRAMYCIN) 100 MG capsule Take 1 capsule (100 mg total) by mouth 2 (two) times daily. 02/23/21  Yes Volney American, PA-C  furosemide (LASIX) 20 MG tablet Take 20 mg by mouth daily.  09/06/17  Yes [provider]  gabapentin (NEURONTIN) 300 MG capsule Take 1 capsule (300 mg total) by mouth 3 (three) times daily. 10/29/15  Yes Fayrene Helper, MD  losartan (COZAAR) 25 MG tablet Take 25 mg by mouth daily.  11/08/17  Yes [provider]  meloxicam (MOBIC) 7.5 MG tablet TAKE 1 TABLET(7.5 MG) BY MOUTH TWICE DAILY 01/20/21  Yes Carole Civil, MD  metFORMIN (GLUCOPHAGE) 500 MG tablet TAKE 1 TABLET BY MOUTH TWICE DAILY WITH A MEAL Patient taking differently: Take 500 mg by mouth daily with lunch. 11/01/15  Yes Fayrene Helper, MD  pravastatin (PRAVACHOL) 80 MG tablet TAKE 1 TABLET(80 MG) BY MOUTH EVERY EVENING Patient taking differently: Take 80 mg by mouth every evening. 11/06/16  Yes Fayrene Helper, MD  predniSONE (DELTASONE) 20 MG tablet Take 2 tablets (40 mg total) by mouth daily with breakfast. 02/23/21  Yes Volney American, PA-C  promethazine-dextromethorphan (PROMETHAZINE-DM) 6.25-15 MG/5ML syrup Take 5 mLs by mouth 4 (four) times daily as needed. 02/23/21  Yes Volney American, PA-C  spironolactone (ALDACTONE) 25 MG tablet Take 25 mg by mouth daily.  09/06/17  Yes [provider]  Vitamin D, Ergocalciferol, (DRISDOL) 1.25 MG (50000 UNIT) CAPS capsule Take 50,000 Units by mouth every Sunday. 03/26/20  Yes [provider]   Family History Family History  Problem Relation Age of Onset   Cancer Father        throat   Throat cancer Father    Hypertension Father    Diabetes Father    Diabetes Maternal Grandmother    Hypertension Maternal Grandfather     Hypertension Paternal Grandmother    Diabetes Paternal Grandfather    Hypertension Mother    Diabetes Mother    Cancer Paternal Aunt    Colon cancer Neg Hx    Colon polyps Neg Hx     Social History Social History   Tobacco Use   Smoking status: Never   Smokeless tobacco: Never  Vaping Use   Vaping Use: Never used  Substance Use Topics   Alcohol use: No    Alcohol/week: 0.0 standard drinks   Drug use: No     Allergies   Ace inhibitors   Review of Systems Review of Systems Per  HPI  Physical Exam Triage Vital Signs ED Triage Vitals  Enc Vitals Group     BP 02/23/21 1129 139/76     Pulse Rate 02/23/21 1129 85     Resp 02/23/21 1129 18     Temp 02/23/21 1129 99.4 F (37.4 C)     Temp Source 02/23/21 1129 Oral     SpO2 02/23/21 1129 97 %     Weight 02/23/21 1130 220 lb (99.8 kg)     Height 02/23/21 1130 5' 2.5" (1.588 m)     Head Circumference --      Peak Flow --      Pain Score 02/23/21 1130 0     Pain Loc --      Pain Edu? --      Excl. in [place]? --    No data found.  Updated Vital Signs BP 139/76 (BP Location: Right Arm)    Pulse 85    Temp 99.4 F (37.4 C) (Oral)    Resp 18    Ht 5' 2.5" (1.588 m)    Wt 220 lb (99.8 kg)    SpO2 97%    BMI 39.60 kg/m   Visual Acuity Right Eye Distance:   Left Eye Distance:   Bilateral Distance:    Right Eye Near:   Left Eye Near:    Bilateral Near:     Physical Exam Vitals and nursing note reviewed.  Constitutional:      Appearance: Normal appearance.  HENT:     Head: Atraumatic.     Right Ear: Tympanic membrane and external ear normal.     Left Ear: Tympanic membrane and external ear normal.     Nose: Congestion present.     Mouth/Throat:     Mouth: Mucous membranes are moist.     Pharynx: Posterior oropharyngeal erythema present.  Eyes:     Extraocular Movements: Extraocular movements intact.     Conjunctiva/sclera: Conjunctivae normal.  Cardiovascular:     Rate and Rhythm: Normal rate and regular  rhythm.     Heart sounds: Normal heart sounds.  Pulmonary:     Effort: Pulmonary effort is normal.     Breath sounds: Wheezing present. No rales.  Abdominal:     General: Bowel sounds are normal. There is no distension.     Palpations: Abdomen is soft.     Tenderness: There is no abdominal tenderness. There is no guarding.  Musculoskeletal:        General: Normal range of motion.     Cervical back: Normal range of motion and neck supple.  Skin:    General: Skin is warm and dry.  Neurological:     Mental Status: She is alert and oriented to person, place, and time.     Motor: No weakness.     Gait: Gait normal.  Psychiatric:        Mood and Affect: Mood normal.        Thought Content: Thought content normal.   UC Treatments / Results  Labs (all labs ordered are listed, but only abnormal results are displayed) Labs Reviewed - No data to display  EKG  Radiology No results found.  Procedures Procedures (including critical care time)  Medications Ordered in UC Medications - No data to display  Initial Impression / Assessment and Plan / UC Course  I have reviewed the triage vital signs and the nursing notes.  Pertinent labs & imaging results that were available during my care of the patient  were reviewed by me and considered in my medical decision making (see chart for details).     Given duration and worsening course, will cover for bacterial infection with doxycycline in addition to treating wheezing, chest tightness with prednisone, Phenergan DM.  Discussed supportive home care and return precautions.  Final Clinical Impressions(s) / UC Diagnoses   Final diagnoses:  Lower respiratory infection   Discharge Instructions   None    ED Prescriptions     Medication Sig Dispense Auth. Provider   doxycycline (VIBRAMYCIN) 100 MG capsule Take 1 capsule (100 mg total) by mouth 2 (two) times daily. 14 capsule Volney American, [place]   promethazine-dextromethorphan  (PROMETHAZINE-DM) 6.25-15 MG/5ML syrup Take 5 mLs by mouth 4 (four) times daily as needed. 100 mL Volney American, PA-C   predniSONE (DELTASONE) 20 MG tablet Take 2 tablets (40 mg total) by mouth daily with breakfast. 10 tablet Volney American, [place]      PDMP not reviewed this encounter.   Volney American, [place] 02/23/21 1416

## 2021-02-23 NOTE — ED Triage Notes (Addendum)
Pt reports cough, productive cough x1 week. Pt reports sputum was yellow in color but has improved and is now clear. Pt reports has inhaler for as needed but reports hasn't had to use it. No audible wheezing heard in triage. Nad noted. Pt able to speak clear complete sentences.

## 2021-02-27 ENCOUNTER — Ambulatory Visit (HOSPITAL_COMMUNITY)
Admission: RE | Admit: 2021-02-27 | Discharge: 2021-02-27 | Disposition: A | Discharge status: Home / Self Care | Payer: Federal, State, Local not specified - PPO | Source: Ambulatory Visit | Attending: Gerontology | Admitting: Gerontology

## 2021-02-27 ENCOUNTER — Other Ambulatory Visit: Payer: Self-pay

## 2021-02-27 ENCOUNTER — Other Ambulatory Visit (HOSPITAL_COMMUNITY): Payer: Self-pay | Admitting: Gerontology

## 2021-02-27 ENCOUNTER — Other Ambulatory Visit (HOSPITAL_COMMUNITY)
Admission: RE | Admit: 2021-02-27 | Discharge: 2021-02-27 | Disposition: A | Discharge status: Home / Self Care | Payer: Federal, State, Local not specified - PPO | Source: Ambulatory Visit | Attending: Internal Medicine | Admitting: Internal Medicine

## 2021-02-27 DIAGNOSIS — J410 Simple chronic bronchitis: Secondary | ICD-10-CM | POA: Diagnosis not present

## 2021-02-27 DIAGNOSIS — E559 Vitamin D deficiency, unspecified: Secondary | ICD-10-CM | POA: Insufficient documentation

## 2021-02-27 DIAGNOSIS — R059 Cough, unspecified: Secondary | ICD-10-CM

## 2021-02-27 DIAGNOSIS — E1169 Type 2 diabetes mellitus with other specified complication: Secondary | ICD-10-CM | POA: Diagnosis not present

## 2021-02-27 DIAGNOSIS — I10 Essential (primary) hypertension: Secondary | ICD-10-CM | POA: Diagnosis not present

## 2021-02-27 LAB — VITAMIN D 25 HYDROXY (VIT D DEFICIENCY, FRACTURES): Vit D, 25-Hydroxy: 68.84 ng/mL (ref 30–100)

## 2021-02-27 IMAGING — DX DG CHEST 2V
2 series · 2 of 2 positions shown · non-contrast
Comparison: [DATE]

CLINICAL DATA: cough

EXAM:
CHEST - 2 VIEW

[chest pa]
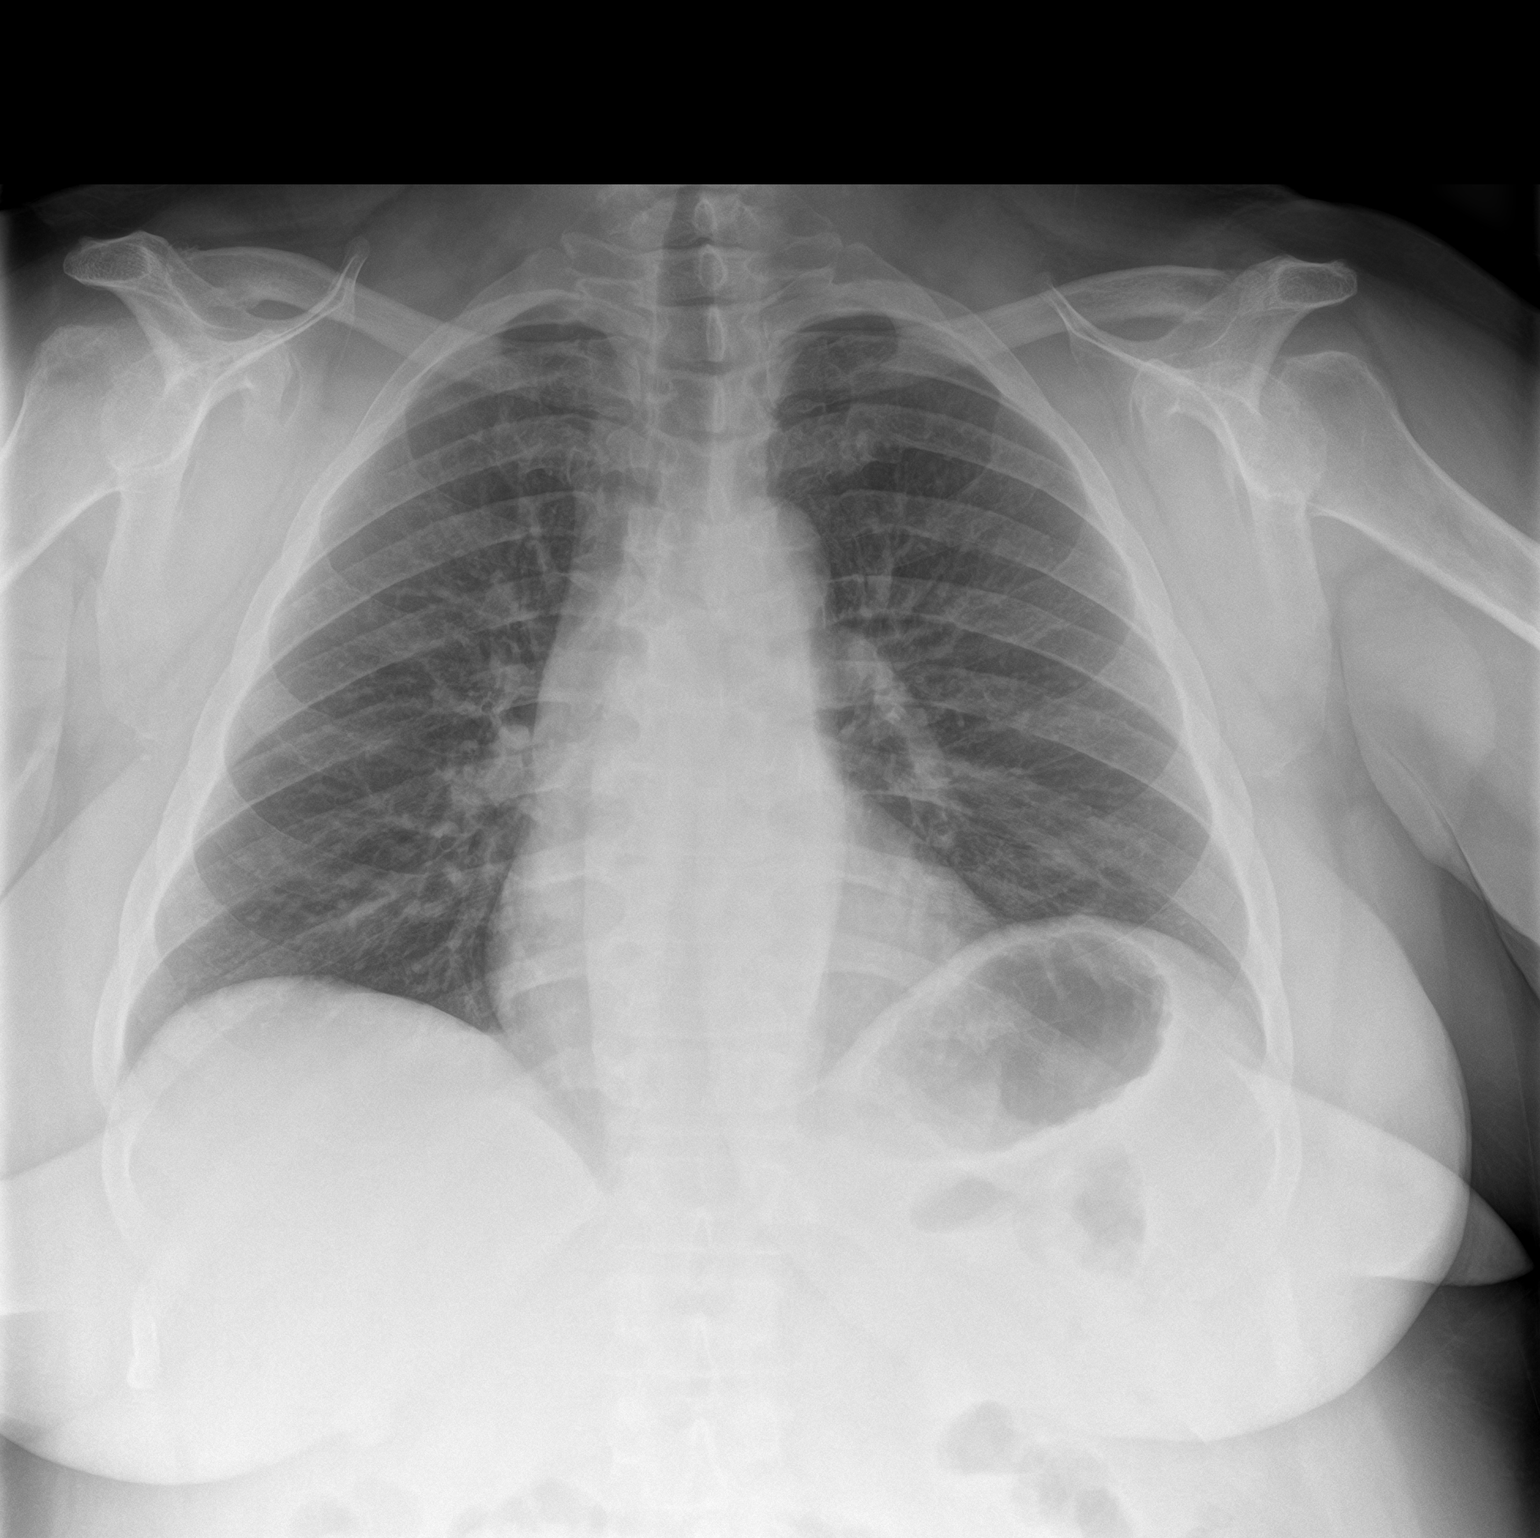

[chest lat]
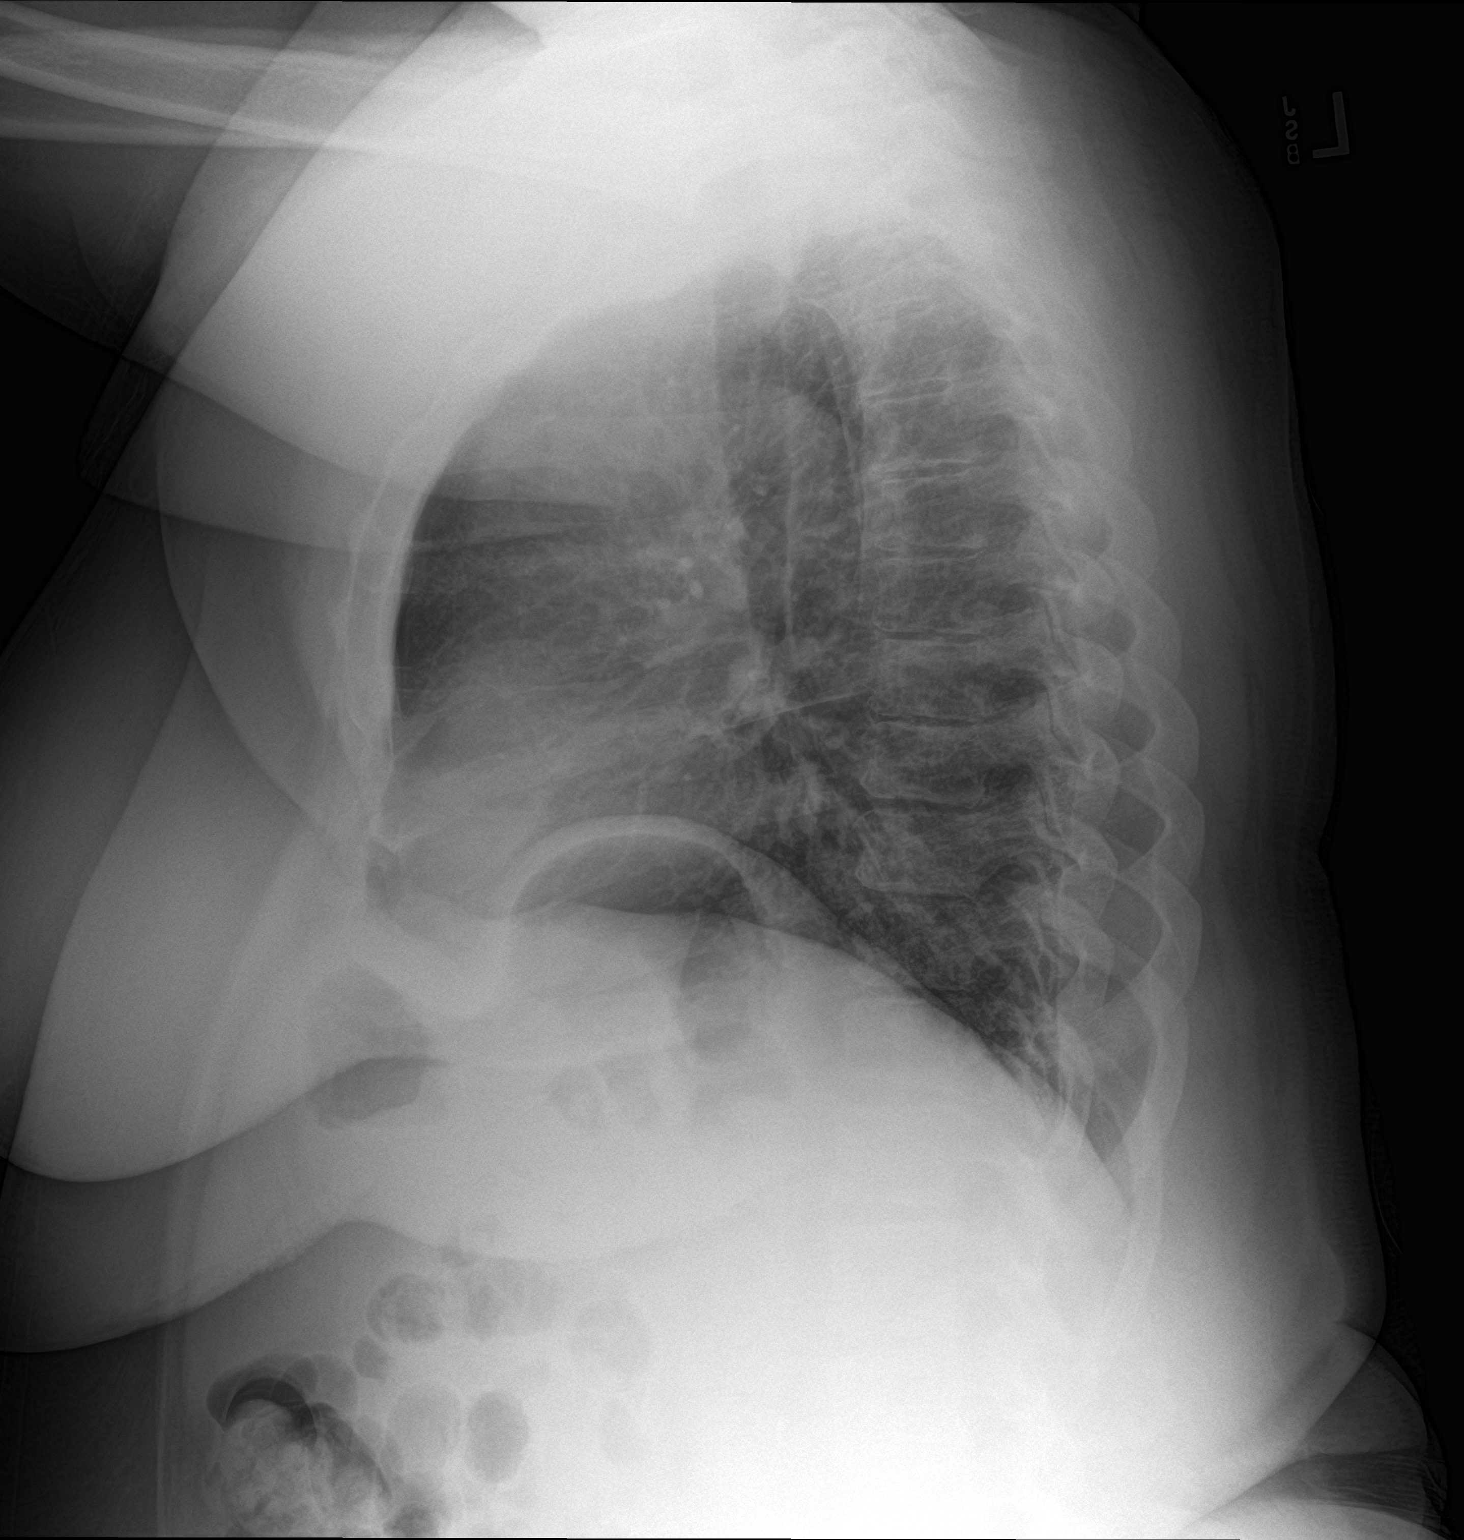

[2 of 2 positions shown; findings below may reference images not displayed]

FINDINGS: The cardiomediastinal silhouette is within normal limits. No pleural
effusion. No pneumothorax. No mass or consolidation. No acute
osseous abnormality.
IMPRESSION: No acute findings in the chest.  No consolidative pneumonia.

## 2021-03-05 ENCOUNTER — Encounter: Payer: Self-pay | Admitting: Orthopedic Surgery

## 2021-04-03 ENCOUNTER — Encounter: Payer: Self-pay | Admitting: Orthopedic Surgery

## 2021-04-16 ENCOUNTER — Ambulatory Visit: Payer: Federal, State, Local not specified - PPO | Admitting: Dermatology

## 2021-04-16 ENCOUNTER — Encounter: Payer: Self-pay | Admitting: Dermatology

## 2021-04-16 ENCOUNTER — Other Ambulatory Visit: Payer: Self-pay

## 2021-04-16 DIAGNOSIS — L820 Inflamed seborrheic keratosis: Secondary | ICD-10-CM

## 2021-04-16 DIAGNOSIS — D485 Neoplasm of uncertain behavior of skin: Secondary | ICD-10-CM

## 2021-04-16 DIAGNOSIS — L821 Other seborrheic keratosis: Secondary | ICD-10-CM | POA: Diagnosis not present

## 2021-04-16 NOTE — Patient Instructions (Signed)

## 2021-04-27 ENCOUNTER — Encounter: Payer: Self-pay | Admitting: Dermatology

## 2021-04-27 NOTE — Progress Notes (Signed)
? ?  New Patient ?  ?Subjective  ?Pamela Stein is a 66 y.o. female who presents for the following: Skin Problem (Lesion on face x years. Lesion on chest x 20 years. ). ? ?Enlarging spots on left chest ? ?Neck, multiple other spots which patient would like checked including on back ?Location:  ?Duration:  ?Quality:  ?Associated Signs/Symptoms: ?Modifying Factors:  ?Severity:  ?Timing: ?Context:  ? ? ?The following portions of the chart were reviewed this encounter and updated as appropriate:  Tobacco  Allergies  Meds  Problems  Med Hx  Surg Hx  Fam Hx   ?  ? ?Objective  ?Well appearing patient in no apparent distress; mood and affect are within normal limits. ?Head - Anterior (Face) ?Multiple 2 mm slightly verrucous dark brown papules ? ?Mid Back ?Noninflamed 6 mm flattopped keratotic brown papule, compatible dermoscopy ? ?Left Breast ?Gray-brown 1 cm nodule, dermoscopy favors irritated keratosis, lesion has history of growth and concerns patient who requests biopsy ? ? ? ? ? ? ?Neck - Anterior ?Gray-brown 5 mm crust ? ? ? ?All skin waist up examined.  Areas beneath undergarments not fully examined. ? ? ?Assessment & Plan  ?Dermatosis papulosa nigra ?Head - Anterior (Face) ? ?Intervention not medically necessary ? ?Seborrheic keratosis ?Mid Back ? ?Leave if stable ? ?Neoplasm of uncertain behavior of skin (2) ?Left Breast ? ?Skin / nail biopsy ?Type of biopsy: tangential   ?Informed consent: discussed and consent obtained   ?Timeout: patient name, date of birth, surgical site, and procedure verified   ?Anesthesia: the lesion was anesthetized in a standard fashion   ?Anesthetic:  1% lidocaine w/ epinephrine 1-100,000 local infiltration ?Instrument used: flexible razor blade   ?Hemostasis achieved with: ferric subsulfate and electrodesiccation   ?Outcome: patient tolerated procedure well   ?Post-procedure details: wound care instructions given   ? ?Specimen 1 - Surgical pathology ?Differential Diagnosis:  r/o isk ?Cautery  ?Check Margins: No ? ?Neck - Anterior ? ?Skin / nail biopsy ?Type of biopsy: tangential   ?Informed consent: discussed and consent obtained   ?Timeout: patient name, date of birth, surgical site, and procedure verified   ?Anesthesia: the lesion was anesthetized in a standard fashion   ?Anesthetic:  1% lidocaine w/ epinephrine 1-100,000 local infiltration ?Instrument used: flexible razor blade   ?Hemostasis achieved with: aluminum chloride and electrodesiccation   ?Outcome: patient tolerated procedure well   ?Post-procedure details: wound care instructions given   ? ?Specimen 2 - Surgical pathology ?Differential Diagnosis: r/o isk ? ?Check Margins: No ? ? ?

## 2021-05-12 ENCOUNTER — Telehealth: Payer: Self-pay

## 2021-05-12 DIAGNOSIS — K080 Exfoliation of teeth due to systemic causes: Secondary | ICD-10-CM | POA: Diagnosis not present

## 2021-05-12 NOTE — Telephone Encounter (Signed)
Pt called and stated that she wants Anderson Malta to call her, she has a few quick questions to ask her. ?

## 2021-05-13 ENCOUNTER — Telehealth: Payer: Self-pay | Admitting: Orthopedic Surgery

## 2021-05-13 NOTE — Telephone Encounter (Signed)
Patient called and states she needs a script for a cane 3 or 4 prong and send it to [employer].  ? ?Please call her back and let her know if this is possible at [phone] ?

## 2021-05-13 NOTE — Telephone Encounter (Signed)
Pamela Stein called asking about facial hair and skin tags. Instructed to call dermatologist, for best advice  ?

## 2021-05-14 ENCOUNTER — Other Ambulatory Visit: Payer: Self-pay

## 2021-05-14 DIAGNOSIS — M25362 Other instability, left knee: Secondary | ICD-10-CM

## 2021-05-14 DIAGNOSIS — M1712 Unilateral primary osteoarthritis, left knee: Secondary | ICD-10-CM

## 2021-05-14 DIAGNOSIS — Z96652 Presence of left artificial knee joint: Secondary | ICD-10-CM

## 2021-05-14 NOTE — Telephone Encounter (Signed)
yes

## 2021-05-14 NOTE — Telephone Encounter (Signed)
Done

## 2021-05-15 DIAGNOSIS — M25362 Other instability, left knee: Secondary | ICD-10-CM | POA: Diagnosis not present

## 2021-05-15 DIAGNOSIS — M1712 Unilateral primary osteoarthritis, left knee: Secondary | ICD-10-CM | POA: Diagnosis not present

## 2021-05-15 DIAGNOSIS — Z96652 Presence of left artificial knee joint: Secondary | ICD-10-CM | POA: Diagnosis not present

## 2021-05-19 ENCOUNTER — Encounter: Payer: Self-pay | Admitting: Orthopedic Surgery

## 2021-05-19 ENCOUNTER — Ambulatory Visit: Payer: Federal, State, Local not specified - PPO | Admitting: Orthopedic Surgery

## 2021-05-19 ENCOUNTER — Ambulatory Visit: Payer: Federal, State, Local not specified - PPO

## 2021-05-19 DIAGNOSIS — Z96652 Presence of left artificial knee joint: Secondary | ICD-10-CM

## 2021-05-19 NOTE — Progress Notes (Signed)
FOLLOW UP  ? ?Encounter Diagnosis  ?Name Primary?  ? Status post total left knee replacement Yes  ? ? ? ?Chief Complaint  ?Patient presents with  ? s/p total knee replacement left  ?  DOS 05/28/20  ? ?66 year old female 1 year status post left total knee complicated by falling in the hospital on 2 occasions with the second occasion causing opening of the wound she had wound exploration irrigation debridement and was discharged but has had persistent weakness in her left quadriceps despite 1 year physical therapy bracing and use of a cane ? ?She still has giving way symptoms in the left knee ? ?She has had a couple of falls but she has not been braced during the fall ? ?Exam shows full range of motion except for a 15 degree extensor lag.  Passive range of motion 0-1 25 knee is stable in all planes ? ?She has some tenderness in the quadriceps insertion ? ?Her x-ray shows perhaps some patella [place] with no loosening ? ?Recommend home exercises, bracing, cane ? ?Patient will need restrictions regarding her return to work to be able to continue her employment which include but are not limited to special accommodations for parking and limitations in the amount of work that she actually does in terms of how difficult it is ? ?X-ray 1 year ? ?Continue brace, continue cane ?

## 2021-05-19 NOTE — Patient Instructions (Signed)
Wear brace for activity  ?

## 2021-05-20 ENCOUNTER — Ambulatory Visit: Payer: Federal, State, Local not specified - PPO | Admitting: Dermatology

## 2021-05-26 DIAGNOSIS — I10 Essential (primary) hypertension: Secondary | ICD-10-CM | POA: Diagnosis not present

## 2021-05-26 DIAGNOSIS — J410 Simple chronic bronchitis: Secondary | ICD-10-CM | POA: Diagnosis not present

## 2021-05-26 DIAGNOSIS — E1169 Type 2 diabetes mellitus with other specified complication: Secondary | ICD-10-CM | POA: Diagnosis not present

## 2021-05-28 ENCOUNTER — Ambulatory Visit (INDEPENDENT_AMBULATORY_CARE_PROVIDER_SITE_OTHER): Payer: Federal, State, Local not specified - PPO | Admitting: Dermatology

## 2021-05-28 ENCOUNTER — Encounter: Payer: Self-pay | Admitting: Dermatology

## 2021-05-28 DIAGNOSIS — L821 Other seborrheic keratosis: Secondary | ICD-10-CM

## 2021-05-28 NOTE — Progress Notes (Signed)
? ?  Follow-Up Visit ?  ?Subjective  ?Pamela Stein is a 66 y.o. female who presents for the following: Follow-up (F/u for isk on left breast and neck. ). ? ?Patient not seen ?Location:  ?Duration:  ?Quality:  ?Associated Signs/Symptoms: ?Modifying Factors:  ?Severity:  ?Timing: ?Context:  ? ?Objective  ?Well appearing patient in no apparent distress; mood and affect are within normal limits. ?Follow-up visit for keratosis but patient left without being seen ? ? ? ?A focused examination was performed including no physical examination today. Relevant physical exam findings are noted in the Assessment and Plan. ? ? ?Assessment & Plan  ? ? ?Seborrheic keratosis ? ?Patient not seen ? ? ? ? ? ?I, Lavonna Monarch, MD, have reviewed all documentation for this visit.  The documentation on 05/28/21 for the exam, diagnosis, procedures, and orders are all accurate and complete. ?

## 2021-06-24 ENCOUNTER — Encounter: Payer: Self-pay | Admitting: Orthopedic Surgery

## 2021-06-24 ENCOUNTER — Telehealth: Payer: Self-pay | Admitting: Orthopedic Surgery

## 2021-06-24 NOTE — Telephone Encounter (Signed)
Patient called for a note for work, as we have been providing; states she will continue to need each month. Aware note will be done today and ready for pick up.  ?

## 2021-06-26 ENCOUNTER — Encounter: Payer: Self-pay | Admitting: Orthopedic Surgery

## 2021-07-11 ENCOUNTER — Ambulatory Visit (HOSPITAL_COMMUNITY)
Admission: RE | Admit: 2021-07-11 | Discharge: 2021-07-11 | Disposition: A | Discharge status: Home / Self Care | Payer: Federal, State, Local not specified - PPO | Source: Ambulatory Visit | Attending: Gerontology | Admitting: Gerontology

## 2021-07-11 ENCOUNTER — Other Ambulatory Visit (HOSPITAL_COMMUNITY): Payer: Self-pay | Admitting: Gerontology

## 2021-07-11 DIAGNOSIS — R059 Cough, unspecified: Secondary | ICD-10-CM | POA: Insufficient documentation

## 2021-07-11 IMAGING — DX DG CHEST 2V
2 series · 2 of 2 positions shown · non-contrast
Comparison: [DATE]

CLINICAL DATA: Cough, occasionally productive, history of
bronchitis

EXAM:
CHEST - 2 VIEW

[chest pa]
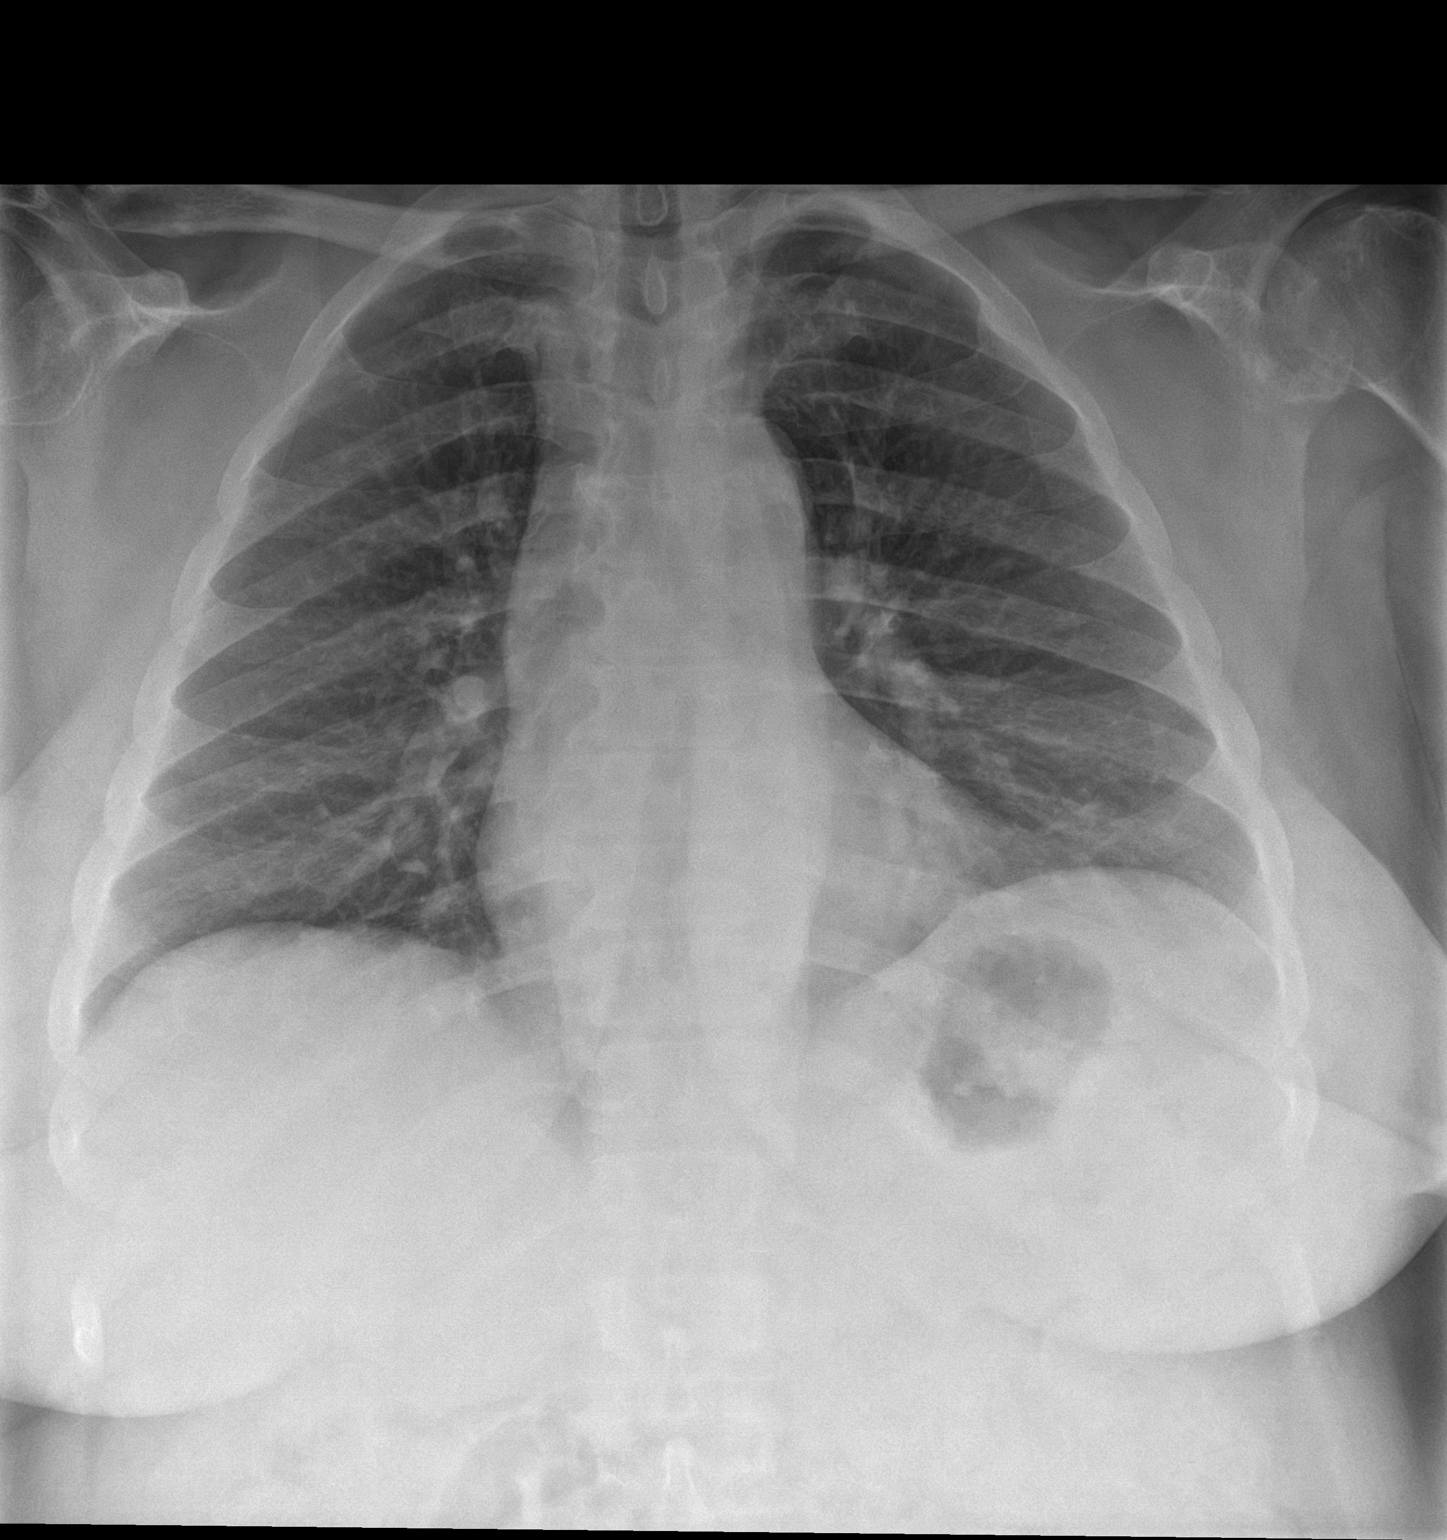

[chest lat]
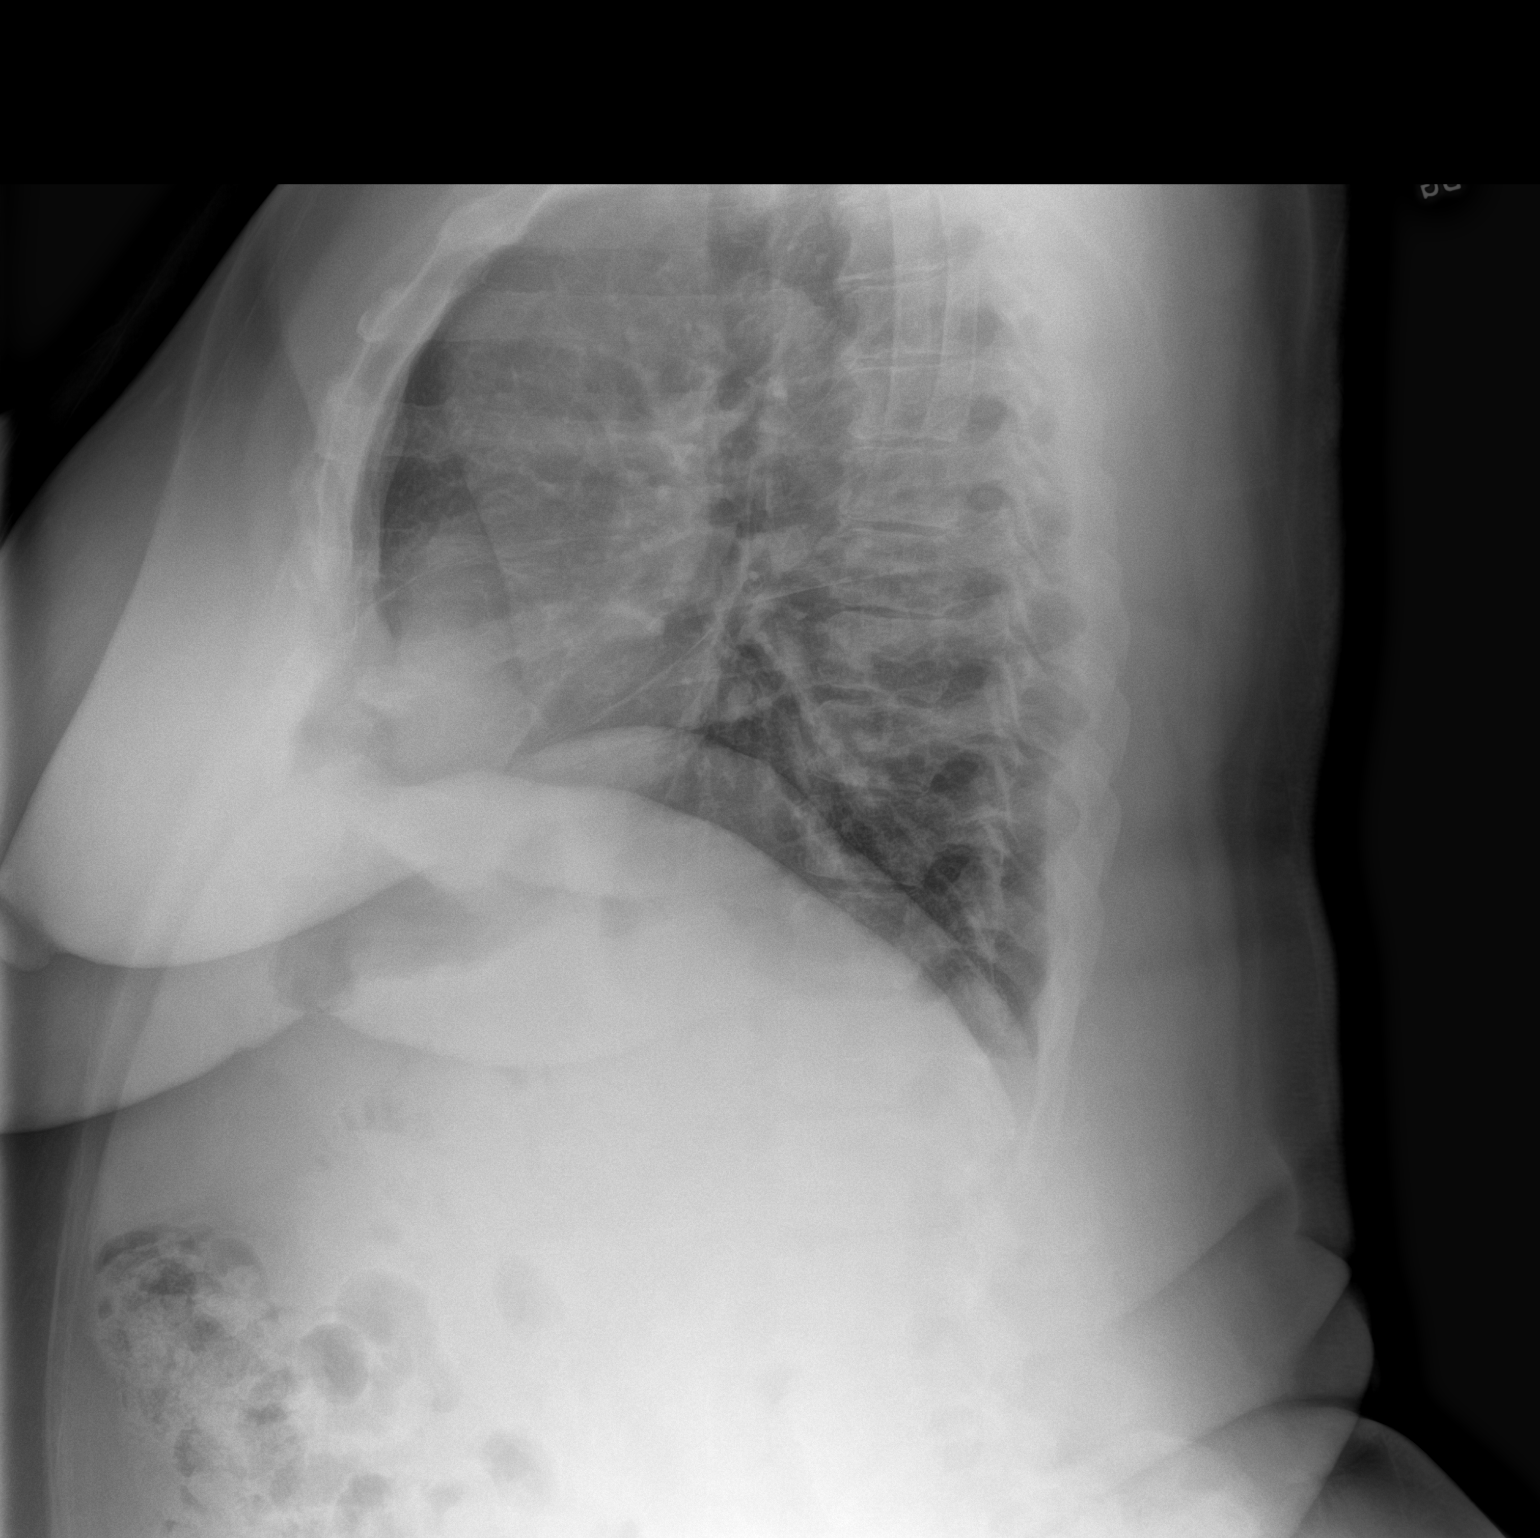

[2 of 2 positions shown; findings below may reference images not displayed]

FINDINGS: The heart size and mediastinal contours are within normal limits.
Both lungs are clear. Disc degenerative disease of the thoracic
spine.
IMPRESSION: No acute abnormality of the lungs.

## 2021-07-30 ENCOUNTER — Encounter: Payer: Self-pay | Admitting: Orthopedic Surgery

## 2021-08-20 ENCOUNTER — Telehealth: Payer: Self-pay

## 2021-08-20 NOTE — Telephone Encounter (Signed)
Left message I called 

## 2021-08-20 NOTE — Telephone Encounter (Signed)
Patient would like for Anderson Malta to call her, pt stated that she has noticed that she is growing a lot of facial hair and wants to know can she take Estrogen to slow it down.

## 2021-08-28 ENCOUNTER — Encounter: Payer: Self-pay | Admitting: Orthopedic Surgery

## 2021-09-08 DIAGNOSIS — I10 Essential (primary) hypertension: Secondary | ICD-10-CM | POA: Diagnosis not present

## 2021-09-08 DIAGNOSIS — E1165 Type 2 diabetes mellitus with hyperglycemia: Secondary | ICD-10-CM | POA: Diagnosis not present

## 2021-09-08 DIAGNOSIS — E785 Hyperlipidemia, unspecified: Secondary | ICD-10-CM | POA: Diagnosis not present

## 2021-09-24 ENCOUNTER — Telehealth: Payer: Self-pay | Admitting: *Deleted

## 2021-09-24 NOTE — Chronic Care Management (AMB) (Signed)
  Care Coordination  Outreach Note  09/24/2021 Name: Pamela Stein MRN: [id number] DOB: [birth year]   Care Coordination Outreach Attempts  An unsuccessful telephone outreach was attempted today to offer the patient information about available care coordination services as a benefit of their health plan.   Follow Up Plan:  Additional outreach attempts will be made to offer the patient care coordination information and services.   Encounter Outcome:  Pt. Request to Call Back due to death in family    [place]: [phone]

## 2021-09-30 DIAGNOSIS — H35033 Hypertensive retinopathy, bilateral: Secondary | ICD-10-CM | POA: Diagnosis not present

## 2021-10-02 NOTE — Chronic Care Management (AMB) (Signed)
  Care Coordination  Outreach Note  10/02/2021 Name: Pamela Stein MRN: [id number] DOB: December 09, 1955   Care Coordination Outreach Attempts  A second unsuccessful outreach was attempted today to offer the patient with information about available care coordination services as a benefit of their health plan.     Follow Up Plan:  Additional outreach attempts will be made to offer the patient care coordination information and services.   Encounter Outcome:  No Answer  [place]  Direct Dial: [phone]

## 2021-10-03 ENCOUNTER — Encounter: Payer: Self-pay | Admitting: Orthopedic Surgery

## 2021-10-07 NOTE — Chronic Care Management (AMB) (Signed)
  Care Coordination  Outreach Note  10/07/2021 Name: CAELIE REMSBURG MRN: [id number] DOB: Aug 06, 1955   Care Coordination Outreach Attempts  A third unsuccessful outreach was attempted today to offer the patient with information about available care coordination services as a benefit of their health plan.   Follow Up Plan:  No further outreach attempts will be made at this time. We have been unable to contact the patient to offer or enroll patient in care coordination services  Encounter Outcome:  No Answer  [place]: [phone]

## 2021-10-15 ENCOUNTER — Telehealth: Payer: Self-pay | Admitting: Radiology

## 2021-10-15 NOTE — Telephone Encounter (Signed)
Patient called LMVM, said she fell backwards out of a chair last night.  She is ok, just wanted to let you know.

## 2021-11-20 ENCOUNTER — Telehealth: Payer: Self-pay | Admitting: Radiology

## 2021-11-20 ENCOUNTER — Encounter: Payer: Self-pay | Admitting: Orthopedic Surgery

## 2021-11-20 NOTE — Telephone Encounter (Signed)
I spoke with patient; letter issued per request.

## 2021-11-20 NOTE — Telephone Encounter (Signed)
Patient called, LM asking for a letter to continue her instructions on what Dr Aline Brochure has told her to do.  Please call her and take care of?  Thanks.

## 2022-01-05 DIAGNOSIS — Z0001 Encounter for general adult medical examination with abnormal findings: Secondary | ICD-10-CM | POA: Diagnosis not present

## 2022-01-05 DIAGNOSIS — I10 Essential (primary) hypertension: Secondary | ICD-10-CM | POA: Diagnosis not present

## 2022-01-05 DIAGNOSIS — E118 Type 2 diabetes mellitus with unspecified complications: Secondary | ICD-10-CM | POA: Diagnosis not present

## 2022-01-05 DIAGNOSIS — Z23 Encounter for immunization: Secondary | ICD-10-CM | POA: Diagnosis not present

## 2022-01-06 DIAGNOSIS — I10 Essential (primary) hypertension: Secondary | ICD-10-CM | POA: Diagnosis not present

## 2022-01-06 DIAGNOSIS — E039 Hypothyroidism, unspecified: Secondary | ICD-10-CM | POA: Diagnosis not present

## 2022-01-06 DIAGNOSIS — E785 Hyperlipidemia, unspecified: Secondary | ICD-10-CM | POA: Diagnosis not present

## 2022-01-06 DIAGNOSIS — E1169 Type 2 diabetes mellitus with other specified complication: Secondary | ICD-10-CM | POA: Diagnosis not present

## 2022-04-09 ENCOUNTER — Encounter: Payer: Self-pay | Admitting: Radiology

## 2022-05-25 ENCOUNTER — Other Ambulatory Visit (INDEPENDENT_AMBULATORY_CARE_PROVIDER_SITE_OTHER): Payer: Federal, State, Local not specified - PPO

## 2022-05-25 ENCOUNTER — Encounter: Payer: Self-pay | Admitting: Orthopedic Surgery

## 2022-05-25 ENCOUNTER — Ambulatory Visit: Payer: Federal, State, Local not specified - PPO | Admitting: Orthopedic Surgery

## 2022-05-25 DIAGNOSIS — Z96652 Presence of left artificial knee joint: Secondary | ICD-10-CM

## 2022-05-25 NOTE — Progress Notes (Signed)
FOLLOW UP   Encounter Diagnosis  Name Primary?   S/P total knee replacement, left 05/28/20 Yes    Chief Complaint  Patient presents with   s/p TKA LEFT    DOS 05/28/20    PROCEDURE:  Procedure(s): LEFT TOTAL KNEE ARTHROPLASTY (Left)    Implants ATTUNE: [employer] attune posterior stabilized fixed-bearing total knee Size 6 femur Size 6 tibia Size 5 poly- Size 35 x 8.5 patella  ANNUAL FOLLOW UP FOR  LEFT  TKA   Chief Complaint  Patient presents with   s/p TKA LEFT    DOS 05/28/20     HPI: The patient is here for the annual  follow-up x-ray for knee replacement. The patient is not complaining of pain weakness instability or stiffness in the repaired knee.   Examination of the LEFT  KNEE  There were no vitals taken for this visit. General the patient is normally groomed in no distress Inspection shows : incision healed nicely without erythema, no tenderness no swelling Range of motion total range of motion is 0-125 Stability the knee is stable anterior to posterior as well as medial to lateral Strength quadriceps strength is normal Skin no erythema around the skin incision Neuro: normal sensation in the operative leg  Gait: ASSISTED BY A CANE    Medical decision-making section X-rays ordered, internal imaging shows (see full dictated report) stable implant with no signs of loosening  Diagnosis  Encounter Diagnosis  Name Primary?   S/P total knee replacement, left 05/28/20 Yes     Plan follow-up 1 year repeat x-rays

## 2022-06-17 ENCOUNTER — Other Ambulatory Visit (HOSPITAL_COMMUNITY): Payer: Self-pay | Admitting: Internal Medicine

## 2022-06-17 DIAGNOSIS — Z1231 Encounter for screening mammogram for malignant neoplasm of breast: Secondary | ICD-10-CM

## 2022-06-25 ENCOUNTER — Ambulatory Visit (HOSPITAL_COMMUNITY)
Admission: RE | Admit: 2022-06-25 | Discharge: 2022-06-25 | Disposition: A | Discharge status: Home / Self Care | Payer: Federal, State, Local not specified - PPO | Source: Ambulatory Visit | Attending: Internal Medicine | Admitting: Internal Medicine

## 2022-06-25 DIAGNOSIS — Z1231 Encounter for screening mammogram for malignant neoplasm of breast: Secondary | ICD-10-CM | POA: Insufficient documentation

## 2022-09-08 ENCOUNTER — Telehealth: Payer: Self-pay | Admitting: Orthopedic Surgery

## 2022-09-08 DIAGNOSIS — M1712 Unilateral primary osteoarthritis, left knee: Secondary | ICD-10-CM

## 2022-09-08 DIAGNOSIS — Z96652 Presence of left artificial knee joint: Secondary | ICD-10-CM

## 2022-09-08 NOTE — Telephone Encounter (Signed)
DR. Romeo Apple    Patient called and wants to know if she can go to physical therapy.  She fell over the weekend, she didn't hurt herself.   Please call her back and advise [phone].

## 2022-09-09 NOTE — Telephone Encounter (Signed)
YES

## 2022-09-09 NOTE — Telephone Encounter (Signed)
Order placed called patient

## 2022-09-22 ENCOUNTER — Ambulatory Visit
Admission: EM | Admit: 2022-09-22 | Discharge: 2022-09-22 | Disposition: A | Discharge status: Home / Self Care | Payer: Federal, State, Local not specified - PPO | Attending: Nurse Practitioner | Admitting: Nurse Practitioner

## 2022-09-22 DIAGNOSIS — Z20822 Contact with and (suspected) exposure to covid-19: Secondary | ICD-10-CM | POA: Insufficient documentation

## 2022-09-22 NOTE — ED Provider Notes (Signed)
[facility]    CSN: [id number] Arrival date & time: 09/22/22  1437      History   Chief Complaint No chief complaint on file.   HPI Pamela Stein is a 67 y.o. female.   The history is provided by the patient.   Patient presents requesting COVID test.  States she was exposed at church 2 days ago.  She denies any symptoms to include fatigue, body aches, fever, chills, headache, nasal congestion, runny nose, cough, abdominal pain, nausea, vomiting, or diarrhea.  Patient states she does not recall if she has ever been vaccinated for COVID.  Past Medical History:  Diagnosis Date   Anxiety    Back pain    Bronchitis    Diabetes mellitus    Dizziness    Heart murmur    History of gout    Hyperlipidemia    Hypertension    Neuropathy    Obesity    Obstructive sleep apnea    CPAP    Patient Active Problem List   Diagnosis Date Noted   S/P abdominal supracervical subtotal hysterectomy 10/08/2020   Encounter for screening fecal occult blood testing 10/08/2020   Encounter for gynecological examination with Papanicolaou smear of cervix 10/08/2020   Chronic pain of left knee 05/31/2020   Wound dehiscence, surgical    Status post total left knee replacement 05/28/2020   Primary osteoarthritis of left knee    Polyneuropathy due to secondary diabetes mellitus (HCC) 05/25/2019   Arthritis of right sacroiliac joint 05/25/2019   Cervical disc disorder 05/25/2019   Falls 05/25/2019   General unsteadiness 05/25/2019   Encounter for well woman exam with routine gynecological exam 01/09/2019   Screening for colorectal cancer 01/09/2019   Encounter for screening colonoscopy 12/13/2017   Hyperparathyroidism, primary (HCC) 12/22/2016   Vitamin D deficiency 10/31/2015   Hyperuricemia 05/08/2013   ONYCHOMYCOSIS, TOENAILS 02/22/2009   NEVI, MULTIPLE 02/22/2009   Diabetes mellitus (HCC) 01/25/2008   BACK PAIN WITH RADICULOPATHY 11/09/2007   Hyperlipidemia LDL goal <100  08/26/2007   Morbid obesity (HCC) 08/26/2007   Essential hypertension 08/26/2007   Obstructive sleep apnea 04/20/2007    Past Surgical History:  Procedure Laterality Date   ABDOMINAL HYSTERECTOMY     [facility]   ADENOIDECTOMY     CATARACT EXTRACTION Left    right 02/2018   COLONOSCOPY  2008   diminutive rectal polyp, s/p removal. polypoid mucosa   COLONOSCOPY WITH PROPOFOL N/A 01/03/2018   Procedure: COLONOSCOPY WITH PROPOFOL;  Surgeon: Corbin Ade, MD;  [facility]: [facility];  Service: Endoscopy;  Laterality: N/A;  12:00pm   DILATION AND CURETTAGE OF UTERUS     ENDOMETRIAL ABLATION     EYE SURGERY Bilateral 12/04/2013   cataract   PARATHYROIDECTOMY N/A 12/22/2016   Procedure: PARATHYROIDECTOMY, NECK EXPLORATION;  Surgeon: Avel Peace, MD;  Location: [facility];  Service: General;  Laterality: N/A;   TONSILLECTOMY     TOTAL KNEE ARTHROPLASTY Left 05/28/2020   Procedure: LEFT TOTAL KNEE ARTHROPLASTY;  Surgeon: Vickki Hearing, MD;  Location: [facility];  Service: Orthopedics;  Laterality: Left;   WOUND EXPLORATION Left 05/31/2020   Procedure: WOUND EXPLORATION AND IRRIGATION LEFT KNEE;  Surgeon: Vickki Hearing, MD;  Location: [facility];  Service: Orthopedics;  Laterality: Left;  irrigation wound exploration, wound closure    OB History     Gravida  1   Para      Term      Preterm  AB  1   Living         SAB  1   IAB      Ectopic      Multiple      Live Births               Home Medications    Prior to Admission medications   Medication Sig Start Date End Date Taking? Authorizing Provider  albuterol (PROVENTIL HFA;VENTOLIN HFA) 108 (90 Base) MCG/ACT inhaler Inhale 1-2 puffs into the lungs every 6 (six) hours as needed for wheezing or shortness of breath. 05/20/16   Coralyn Mark, NP  amLODipine (NORVASC) 10 MG tablet Take 10 mg by mouth daily. 05/06/22   [provider]  aspirin 81 MG chewable tablet Chew 1 tablet (81 mg total) by  mouth 2 (two) times daily. 05/30/20   Vickki Hearing, MD  furosemide (LASIX) 20 MG tablet Take 20 mg by mouth daily.  09/06/17   [provider]  gabapentin (NEURONTIN) 300 MG capsule Take 1 capsule (300 mg total) by mouth 3 (three) times daily. 10/29/15   Kerri Perches, MD  losartan (COZAAR) 50 MG tablet Take 50 mg by mouth daily. 04/09/22   [provider]  meloxicam (MOBIC) 7.5 MG tablet TAKE 1 TABLET(7.5 MG) BY MOUTH TWICE DAILY 01/20/21   Vickki Hearing, MD  metFORMIN (GLUCOPHAGE) 500 MG tablet TAKE 1 TABLET BY MOUTH TWICE DAILY WITH A MEAL Patient taking differently: Take 500 mg by mouth daily with lunch. 11/01/15   Kerri Perches, MD  montelukast (SINGULAIR) 10 MG tablet Take 10 mg by mouth at bedtime. 04/09/22   [provider]  pravastatin (PRAVACHOL) 80 MG tablet TAKE 1 TABLET(80 MG) BY MOUTH EVERY EVENING Patient taking differently: Take 80 mg by mouth every evening. 11/06/16   Kerri Perches, MD  spironolactone (ALDACTONE) 25 MG tablet Take 25 mg by mouth daily.  09/06/17   [provider]  Vitamin D, Ergocalciferol, (DRISDOL) 1.25 MG (50000 UNIT) CAPS capsule Take 50,000 Units by mouth every Sunday. 03/26/20   [provider]    Family History Family History  Problem Relation Age of Onset   Cancer Father        throat   Throat cancer Father    Hypertension Father    Diabetes Father    Diabetes Maternal Grandmother    Hypertension Maternal Grandfather    Hypertension Paternal Grandmother    Diabetes Paternal Grandfather    Hypertension Mother    Diabetes Mother    Cancer Paternal Aunt    Colon cancer Neg Hx    Colon polyps Neg Hx     Social History Social History   Tobacco Use   Smoking status: Never   Smokeless tobacco: Never  Vaping Use   Vaping status: Never Used  Substance Use Topics   Alcohol use: No    Alcohol/week: 0.0 standard drinks of alcohol   Drug use: No     Allergies   Ace  inhibitors   Review of Systems Review of Systems Per HPI  Physical Exam Triage Vital Signs ED Triage Vitals [09/22/22 1548]  Encounter Vitals Group     BP (!) 149/76     Systolic BP Percentile      Diastolic BP Percentile      Pulse Rate 90     Resp 15     Temp 98.2 F (36.8 C)     Temp Source Oral  SpO2 93 %     Weight      Height      Head Circumference      Peak Flow      Pain Score 0     Pain Loc      Pain Education      Exclude from Growth Chart    No data found.  Updated Vital Signs BP (!) 149/76 (BP Location: Right Arm)   Pulse 90   Temp 98.2 F (36.8 C) (Oral)   Resp 15   SpO2 93%   Visual Acuity Right Eye Distance:   Left Eye Distance:   Bilateral Distance:    Right Eye Near:   Left Eye Near:    Bilateral Near:     Physical Exam Vitals and nursing note reviewed.  Constitutional:      General: She is not in acute distress.    Appearance: Normal appearance.  HENT:     Head: Normocephalic.     Right Ear: Tympanic membrane, ear canal and external ear normal.     Left Ear: Tympanic membrane, ear canal and external ear normal.     Nose: Nose normal.     Mouth/Throat:     Mouth: Mucous membranes are moist.  Eyes:     Extraocular Movements: Extraocular movements intact.     Pupils: Pupils are equal, round, and reactive to light.  Cardiovascular:     Rate and Rhythm: Normal rate and regular rhythm.     Pulses: Normal pulses.     Heart sounds: Normal heart sounds.  Pulmonary:     Effort: Pulmonary effort is normal. No respiratory distress.     Breath sounds: Normal breath sounds. No stridor. No wheezing, rhonchi or rales.  Abdominal:     General: Bowel sounds are normal.     Palpations: Abdomen is soft.     Tenderness: There is no abdominal tenderness.  Musculoskeletal:     Cervical back: Normal range of motion.  Lymphadenopathy:     Cervical: No cervical adenopathy.  Skin:    General: Skin is warm and dry.  Neurological:     General:  No focal deficit present.     Mental Status: She is alert and oriented to person, place, and time.  Psychiatric:        Mood and Affect: Mood normal.        Behavior: Behavior normal.      UC Treatments / Results  Labs (all labs ordered are listed, but only abnormal results are displayed) Labs Reviewed  SARS CORONAVIRUS 2 (TAT 6-24 HRS)    EKG   Radiology No results found.  Procedures Procedures (including critical care time)  Medications Ordered in UC Medications - No data to display  Initial Impression / Assessment and Plan / UC Course  I have reviewed the triage vital signs and the nursing notes.  Pertinent labs & imaging results that were available during my care of the patient were reviewed by me and considered in my medical decision making (see chart for details).  Patient presents requesting COVID test after a close exposure.  She currently is asymptomatic.  Patient was advised results will be available via MyChart, she was also advised she will be contacted if the test results are positive.  Patient would like to receive antiviral treatment if her test is positive.  She is able to receive molnupiravir if her test is positive.  Patient is in agreement with this plan of care and verbalizes  understanding.  All questions were answered.  Patient stable for discharge.   Final Clinical Impressions(s) / UC Diagnoses   Final diagnoses:  Exposure to COVID-19 virus     Discharge Instructions      Your results will be available via MyChart on 09/23/2022.  If your results are positive, you will be contacted.  If you have any questions regarding your test, please do not hesitate to call this office. Follow-up as needed.     ED Prescriptions   None    PDMP not reviewed this encounter.   Abran Cantor, NP 09/22/22 [phone]

## 2022-09-22 NOTE — ED Triage Notes (Signed)
Pt c/o being exposed to COVID, requesting a test.

## 2022-09-22 NOTE — Discharge Instructions (Signed)
Your results will be available via MyChart on 09/23/2022.  If your results are positive, you will be contacted.  If you have any questions regarding your test, please do not hesitate to call this office. Follow-up as needed.

## 2022-10-07 ENCOUNTER — Other Ambulatory Visit: Payer: Self-pay

## 2022-10-07 ENCOUNTER — Ambulatory Visit (HOSPITAL_COMMUNITY): Payer: Federal, State, Local not specified - PPO | Attending: Orthopedic Surgery

## 2022-10-07 DIAGNOSIS — R2689 Other abnormalities of gait and mobility: Secondary | ICD-10-CM | POA: Diagnosis present

## 2022-10-07 DIAGNOSIS — R262 Difficulty in walking, not elsewhere classified: Secondary | ICD-10-CM | POA: Insufficient documentation

## 2022-10-07 DIAGNOSIS — M1712 Unilateral primary osteoarthritis, left knee: Secondary | ICD-10-CM | POA: Diagnosis not present

## 2022-10-07 DIAGNOSIS — Z96652 Presence of left artificial knee joint: Secondary | ICD-10-CM | POA: Insufficient documentation

## 2022-10-07 DIAGNOSIS — G8929 Other chronic pain: Secondary | ICD-10-CM | POA: Insufficient documentation

## 2022-10-07 DIAGNOSIS — M6281 Muscle weakness (generalized): Secondary | ICD-10-CM | POA: Insufficient documentation

## 2022-10-07 DIAGNOSIS — M25662 Stiffness of left knee, not elsewhere classified: Secondary | ICD-10-CM | POA: Insufficient documentation

## 2022-10-07 DIAGNOSIS — R29898 Other symptoms and signs involving the musculoskeletal system: Secondary | ICD-10-CM | POA: Insufficient documentation

## 2022-10-07 DIAGNOSIS — M25562 Pain in left knee: Secondary | ICD-10-CM | POA: Diagnosis present

## 2022-10-07 NOTE — Therapy (Signed)
OUTPATIENT PHYSICAL THERAPY LOWER EXTREMITY EVALUATION   Patient Name: Pamela Stein MRN: [id number] DOB:1955/03/27, 67 y.o., female Today's Date: 10/07/2022  END OF SESSION:  PT End of Session - 10/07/22 1046     Visit Number 1    Number of Visits 8    Date for PT Re-Evaluation 11/04/22    Authorization Type [employer]    PT Start Time 1035    PT Stop Time 1115    PT Time Calculation (min) 40 min    Activity Tolerance Patient tolerated treatment well    Behavior During Therapy WFL for tasks assessed/performed             Past Medical History:  Diagnosis Date   Anxiety    Back pain    Bronchitis    Diabetes mellitus    Dizziness    Heart murmur    History of gout    Hyperlipidemia    Hypertension    Neuropathy    Obesity    Obstructive sleep apnea    CPAP   Past Surgical History:  Procedure Laterality Date   ABDOMINAL HYST[facility]CTOMY     [facility]   ADENOIDECTOMY     CATARACT EXTRACTION Left    right 02/2018   COLONOSCOPY  2008   diminutive rectal polyp, s/p removal. polypoid mucosa   COLONOSCOPY WITH PROPOFOL N/A 01/03/2018   Procedure: COLONOSCOPY WITH PROPOFOL;  Surgeon: Corbin Ade, MD;  [facility];  Service: Endoscopy;  Laterality: N/A;  12:00pm   DILATION AND CURETTAGE OF UTERUS     ENDOMETRIAL ABLATION     EYE SURGERY Bilateral 12/04/2013   cataract   PARATHYROIDECTOMY N/A 12/22/2016   Procedure: PARATHYROIDECTOMY, NECK EXPLORATION;  Surgeon: Avel Peace, MD;  Location: [facility];  Service: General;  Laterality: N/A;   TONSILLECTOMY     TOTAL KNEE ARTHROPLASTY Left 05/28/2020   Procedure: LEFT TOTAL KNEE ARTHROPLASTY;  Surgeon: Vickki Hearing, MD;  Location: [facility];  Service: Orthopedics;  Laterality: Left;   WOUND EXPLORATION Left 05/31/2020   Procedure: WOUND EXPLORATION AND IRRIGATION LEFT KNEE;  Surgeon: Vickki Hearing, MD;  Location: [facility];  Service: Orthopedics;  Laterality: Left;  irrigation wound exploration, wound  closure   Patient Active Problem List   Diagnosis Date Noted   S/P abdominal supracervical subtotal hysterectomy 10/08/2020   Encounter for screening fecal occult blood testing 10/08/2020   Encounter for gynecological examination with Papanicolaou smear of cervix 10/08/2020   Chronic pain of left knee 05/31/2020   Wound dehiscence, surgical    Status post total left knee replacement 05/28/2020   Primary osteoarthritis of left knee    Polyneuropathy due to secondary diabetes mellitus (HCC) 05/25/2019   Arthritis of right sacroiliac joint 05/25/2019   Cervical disc disorder 05/25/2019   Falls 05/25/2019   General unsteadiness 05/25/2019   Encounter for well woman exam with routine gynecological exam 01/09/2019   Screening for colorectal cancer 01/09/2019   Encounter for screening colonoscopy 12/13/2017   Hyperparathyroidism, primary (HCC) 12/22/2016   Vitamin D deficiency 10/31/2015   Hyperuricemia 05/08/2013   ONYCHOMYCOSIS, TOENAILS 02/22/2009   NEVI, MULTIPLE 02/22/2009   Diabetes mellitus (HCC) 01/25/2008   BACK PAIN WITH RADICULOPATHY 11/09/2007   Hyperlipidemia LDL goal <100 08/26/2007   Morbid obesity (HCC) 08/26/2007   Essential hypertension 08/26/2007   Obstructive sleep apnea 04/20/2007    PCP: [facility] Gully  REFERRING PROVIDER: Vickki Hearing, MD  REFERRING DIAG: [phone] (ICD-10-CM) - Status post total left knee  replacement M17.12 (ICD-10-CM) - Primary osteoarthritis of left knee  THERAPY DIAG:  Difficulty in walking, not elsewhere classified  Stiffness of left knee, not elsewhere classified  Weakness of left leg  Rationale for Evaluation and Treatment: Rehabilitation  ONSET DATE: 05/2020  SUBJECTIVE:   SUBJECTIVE STATEMENT: History of left TKA in 2022 per Dr Romeo Apple.  Pamela Stein in late July off a step; walks in today with QC; sometimes that left knee will give way; retired; reports walks with QC at baseline; wall walks at home; reports some numbness left  lateral knee to foot  PERTINENT HISTORY: S/p 05/2020 PAIN:  Are you having pain?no pain  PRECAUTIONS: Fall    WEIGHT BEARING RESTRICTIONS: No  FALLS:  Has patient fallen in last 6 months? Yes. Number of falls 4    OCCUPATION: retired  PLOF: Independent;   PATIENT GOALS: get rid of my cane; walk without my cane  NEXT MD VISIT: as needed; sees Romeo Apple yearly  OBJECTIVE:   DIAGNOSTIC FINDINGS: X-ray report left knee   Status post left knee   X-ray shows no fracture dislocation loosening or malalignment   There may be some patella Baha   Impression possible patella Baha normal appearing implant in terms of no loosening and no malalignment  PATIENT SURVEYS:  FOTO 50  COGNITION: Overall cognitive status: Within functional limits for tasks assessed     SENSATION: WFL  EDEMA:  None noted   POSTURE: rounded shoulders and forward head  PALPATION: No tenderness noted left knee  LOWER EXTREMITY ROM:  Active ROM Right eval Left eval  Hip flexion    Hip extension    Hip abduction    Hip adduction    Hip internal rotation    Hip external rotation    Knee flexion    Knee extension    Ankle dorsiflexion    Ankle plantarflexion    Ankle inversion    Ankle eversion    Lumbar flexion 80% available    Lumbar extension 15% available     (Blank rows = not tested)  LOWER EXTREMITY MMT:  MMT Right eval Left eval  Hip flexion 5 4+  Hip extension    Hip abduction    Hip adduction    Hip internal rotation    Hip external rotation    Knee flexion    Knee extension 5 4+  Ankle dorsiflexion 5 4  Ankle plantarflexion    Ankle inversion    Ankle eversion     (Blank rows = not tested)    FUNCTIONAL TESTS:  5 times sit to stand: 24.73 sec no UE assist  GAIT: Distance walked: 50 ft in clinic Assistive device utilized: Quad cane Pamela base (adjusted at eval to appropriate height) Level of assistance: Modified independence Comments: slight antalgic  gait   TODAY'S TREATMENT:  DATE: 10/07/2022 physical therapy evaluation and HEP instruction    PATIENT EDUCATION:  Education details: Patient educated on exam findings, POC, scope of PT, HEP, and what to expect next visit. Person educated: Patient Education method: Explanation, Demonstration, and Handouts Education comprehension: verbalized understanding, returned demonstration, verbal cues required, and tactile cues required  HOME EXERCISE PROGRAM: Access Code: [email or web address] URL: [email or web address] Date: 10/07/2022 Prepared by: [facility]  Exercises - Standing Lumbar Extension with Counter  - 5 x daily - 7 x weekly - 1 sets - 5-10 reps - Prone Press Up  - 2 x daily - 7 x weekly - 1 sets - 10 reps - Standing Single Leg Stance with Counter Support  - 2 x daily - 7 x weekly - 1 sets - 3 reps - 10 sec hold - Sit to Stand  - 2 x daily - 7 x weekly - 1 sets - 10 reps  ASSESSMENT:  CLINICAL IMPRESSION: Patient is a 67 y.o. female who was seen today for physical therapy evaluation and treatment for Z96.652 (ICD-10-CM) - Status post total left knee replacement M17.12 (ICD-10-CM) - Primary osteoarthritis of left knee. Patient demonstrates muscle weakness, reduced ROM, and fascial restrictions which are likely contributing to symptoms of pain and are negatively impacting patient ability to perform ADLs and functional mobility tasks. Patient will benefit from skilled physical therapy services to address these deficits to reduce pain and improve level of function with ADLs and functional mobility tasks.   OBJECTIVE IMPAIRMENTS: Abnormal gait, decreased activity tolerance, decreased balance, decreased endurance, decreased knowledge of condition, decreased mobility, difficulty walking, decreased strength, increased fascial restrictions, and impaired perceived  functional ability.   ACTIVITY LIMITATIONS: carrying, lifting, bending, sitting, standing, squatting, sleeping, and locomotion level  PARTICIPATION LIMITATIONS: meal prep, cleaning, laundry, shopping, community activity, and yard work  [facility] POTENTIAL: Good  CLINICAL DECISION MAKING: Stable/uncomplicated  EVALUATION COMPLEXITY: Low   GOALS: Goals reviewed with patient? No  SHORT TERM GOALS: Target date: 10/21/2022 patient will be independent with initial HEP  Baseline: Goal status: INITIAL  2.  Patient will self report 30% improvement to improve tolerance for functional activity  Baseline:  Goal status: INITIAL  LONG TERM GOALS: Target date: 11/04/2022  Patient will be independent in self management strategies to improve quality of life and functional outcomes.  Baseline:  Goal status: INITIAL  2.  Patient will self report 50% improvement to improve tolerance for functional activity  Baseline:  Goal status: INITIAL  3.  Patient will increase left leg MMTs to 5/5 without pain to promote return to ambulation community distances with minimal deviation.  Baseline: see above Goal status: INITIAL  4.  Patient will remain free of falls Baseline:  Goal status: INITIAL  5.  Patient will improve 5 times sit to stand score from 24.73 sec to 18 sec to demonstrate improved functional mobility and increased lower extremity strength.   Baseline:  Goal status: INITIAL  6.  Patient will ambulate x 224 ft without AD and minimal gait deviation to improve safety with ambulating household distances. Baseline: ambulates with QC Goal status: INITIAL   PLAN:  PT FREQUENCY: 2x/week  PT DURATION: 4 weeks  PLANNED INTERVENTIONS: Therapeutic exercises, Therapeutic activity, Neuromuscular re-education, Balance training, Gait training, Patient/Family education, Joint manipulation, Joint mobilization, Stair training, Orthotic/Fit training, DME instructions, Aquatic Therapy, Dry  Needling, Electrical stimulation, Spinal manipulation, Spinal mobilization, Cryotherapy, Moist heat, Compression bandaging, scar mobilization, Splintting, Taping, Traction, Ultrasound, Ionotophoresis 4mg /ml Dexamethasone, and Manual therapy   PLAN  FOR NEXT SESSION: Review HEP and goals; question lumbar involvement; gait; lower extremity strengthening, balance   1:22 PM, 10/07/22 Pamela Stein Pamela Stein MPT [facility]  [phone] Ph:[phone]

## 2022-10-09 ENCOUNTER — Ambulatory Visit (HOSPITAL_COMMUNITY): Payer: Federal, State, Local not specified - PPO

## 2022-10-09 ENCOUNTER — Encounter (HOSPITAL_COMMUNITY): Payer: Self-pay

## 2022-10-09 DIAGNOSIS — G8929 Other chronic pain: Secondary | ICD-10-CM

## 2022-10-09 DIAGNOSIS — R262 Difficulty in walking, not elsewhere classified: Secondary | ICD-10-CM

## 2022-10-09 DIAGNOSIS — M25662 Stiffness of left knee, not elsewhere classified: Secondary | ICD-10-CM

## 2022-10-09 DIAGNOSIS — M6281 Muscle weakness (generalized): Secondary | ICD-10-CM

## 2022-10-09 DIAGNOSIS — R2689 Other abnormalities of gait and mobility: Secondary | ICD-10-CM

## 2022-10-09 DIAGNOSIS — R29898 Other symptoms and signs involving the musculoskeletal system: Secondary | ICD-10-CM

## 2022-10-09 NOTE — Therapy (Addendum)
OUTPATIENT PHYSICAL THERAPY LOWER EXTREMITY TREATMENT   Patient Name: Pamela Stein MRN: [id number] DOB:10-11-1955, 67 y.o., female Today's Date: 10/09/2022  END OF SESSION:  PT End of Session - 10/09/22 1301     Visit Number 2    Number of Visits 8    Date for PT Re-Evaluation 11/04/22    Authorization Type BCBS    PT Start Time 1301    PT Stop Time 1342    PT Time Calculation (min) 41 min    Activity Tolerance Patient tolerated treatment well    Behavior During Therapy WFL for tasks assessed/performed             Past Medical History:  Diagnosis Date   Anxiety    Back pain    Bronchitis    Diabetes mellitus    Dizziness    Heart murmur    History of gout    Hyperlipidemia    Hypertension    Neuropathy    Obesity    Obstructive sleep apnea    CPAP   Past Surgical History:  Procedure Laterality Date   ABDOMINAL HYSTERECTOMY     [facility]   ADENOIDECTOMY     CATARACT EXTRACTION Left    right 02/2018   COLONOSCOPY  2008   diminutive rectal polyp, s/p removal. polypoid mucosa   COLONOSCOPY WITH PROPOFOL N/A 01/03/2018   Procedure: COLONOSCOPY WITH PROPOFOL;  Surgeon: Corbin Ade, MD;  Location: [facility];  Service: Endoscopy;  Laterality: N/A;  12:00pm   DILATION AND CURETTAGE OF UTERUS     ENDOMETRIAL ABLATION     EYE SURGERY Bilateral 12/04/2013   cataract   PARATHYROIDECTOMY N/A 12/22/2016   Procedure: PARATHYROIDECTOMY, NECK EXPLORATION;  Surgeon: Avel Peace, MD;  Location: [facility];  Service: General;  Laterality: N/A;   TONSILLECTOMY     TOTAL KNEE ARTHROPLASTY Left 05/28/2020   Procedure: LEFT TOTAL KNEE ARTHROPLASTY;  Surgeon: Vickki Hearing, MD;  Location: [facility];  Service: Orthopedics;  Laterality: Left;   WOUND EXPLORATION Left 05/31/2020   Procedure: WOUND EXPLORATION AND IRRIGATION LEFT KNEE;  Surgeon: Vickki Hearing, MD;  Location: [facility];  Service: Orthopedics;  Laterality: Left;  irrigation wound exploration, wound  closure   Patient Active Problem List   Diagnosis Date Noted   S/P abdominal supracervical subtotal hysterectomy 10/08/2020   Encounter for screening fecal occult blood testing 10/08/2020   Encounter for gynecological examination with Papanicolaou smear of cervix 10/08/2020   Chronic pain of left knee 05/31/2020   Wound dehiscence, surgical    Status post total left knee replacement 05/28/2020   Primary osteoarthritis of left knee    Polyneuropathy due to secondary diabetes mellitus (HCC) 05/25/2019   Arthritis of right sacroiliac joint 05/25/2019   Cervical disc disorder 05/25/2019   Falls 05/25/2019   General unsteadiness 05/25/2019   Encounter for well woman exam with routine gynecological exam 01/09/2019   Screening for colorectal cancer 01/09/2019   Encounter for screening colonoscopy 12/13/2017   Hyperparathyroidism, primary (HCC) 12/22/2016   Vitamin D deficiency 10/31/2015   Hyperuricemia 05/08/2013   ONYCHOMYCOSIS, TOENAILS 02/22/2009   NEVI, MULTIPLE 02/22/2009   Diabetes mellitus (HCC) 01/25/2008   BACK PAIN WITH RADICULOPATHY 11/09/2007   Hyperlipidemia LDL goal <100 08/26/2007   Morbid obesity (HCC) 08/26/2007   Essential hypertension 08/26/2007   Obstructive sleep apnea 04/20/2007    PCP: Avon Gully  REFERRING PROVIDER: Vickki Hearing, MD  REFERRING DIAG: [phone] (ICD-10-CM) - Status post total left knee  replacement M17.12 (ICD-10-CM) - Primary osteoarthritis of left knee  THERAPY DIAG:  Difficulty in walking, not elsewhere classified  Stiffness of left knee, not elsewhere classified  Weakness of left leg  Chronic pain of left knee  Muscle weakness (generalized)  Other abnormalities of gait and mobility  Rationale for Evaluation and Treatment: Rehabilitation  ONSET DATE: 05/2020  SUBJECTIVE:   SUBJECTIVE STATEMENT: 10/09/22:  Pt reports no pain, does c/o stiffness.  Has rubbed cream on knee that helped this morning.    Eval:  History of  left TKA in 2022 per Dr Romeo Apple.  Larey Seat in late July off a step; walks in today with QC; sometimes that left knee will give way; retired; reports walks with QC at baseline; wall walks at home; reports some numbness left lateral knee to foot  PERTINENT HISTORY: S/p 05/2020 PAIN:  Are you having pain?no pain  PRECAUTIONS: Fall    WEIGHT BEARING RESTRICTIONS: No  FALLS:  Has patient fallen in last 6 months? Yes. Number of falls 4    OCCUPATION: retired  PLOF: Independent;   PATIENT GOALS: get rid of my cane; walk without my cane  NEXT MD VISIT: as needed; sees Romeo Apple yearly  OBJECTIVE:   DIAGNOSTIC FINDINGS: X-ray report left knee   Status post left knee   X-ray shows no fracture dislocation loosening or malalignment   There may be some patella Baha   Impression possible patella Baha normal appearing implant in terms of no loosening and no malalignment  PATIENT SURVEYS:  FOTO 50  COGNITION: Overall cognitive status: Within functional limits for tasks assessed     SENSATION: WFL  EDEMA:  None noted   POSTURE: rounded shoulders and forward head  PALPATION: No tenderness noted left knee  LOWER EXTREMITY ROM:  Active ROM Right eval Left eval  Hip flexion    Hip extension    Hip abduction    Hip adduction    Hip internal rotation    Hip external rotation    Knee flexion    Knee extension    Ankle dorsiflexion    Ankle plantarflexion    Ankle inversion    Ankle eversion    Lumbar flexion 80% available    Lumbar extension 15% available     (Blank rows = not tested)  LOWER EXTREMITY MMT:  MMT Right eval Left eval Right 10/09/22 Left 10/09/22  Hip flexion 5 4+    Hip extension   3+ 3/5  Hip abduction   3+ 3/5  Hip adduction      Hip internal rotation      Hip external rotation      Knee flexion      Knee extension 5 4+    Ankle dorsiflexion 5 4    Ankle plantarflexion      Ankle inversion      Ankle eversion       (Blank rows = not  tested)    FUNCTIONAL TESTS:  5 times sit to stand: 24.73 sec no UE assist  GAIT: Distance walked: 50 ft in clinic Assistive device utilized: Quad cane small base (adjusted at eval to appropriate height) Level of assistance: Modified independence Comments: slight antalgic gait   TODAY'S TREATMENT:  DATE:  10/09/22: Reviewed goals Educated importance of HEP compliance for maximal benefits MMT Prone: press up 5x 10 (reports of decreased numbness following reps)  Hip extension Sidelying: abduction 10 Supine: bridge 10x 5" Seated: STS 10x eccentric control Standing: Lumbar extension 10x  Heel raise 10x  SLS Lt unable without finger assistance; Rt 2" max  10/07/2022 physical therapy evaluation and HEP instruction    PATIENT EDUCATION:  Education details: Patient educated on exam findings, POC, scope of PT, HEP, and what to expect next visit. Person educated: Patient Education method: Explanation, Demonstration, and Handouts Education comprehension: verbalized understanding, returned demonstration, verbal cues required, and tactile cues required  HOME EXERCISE PROGRAM: Access Code: [email or web address] URL: [email or web address] Date: 10/07/2022 Prepared by: [facility] - [facility]  Exercises - Standing Lumbar Extension with Counter  - 5 x daily - 7 x weekly - 1 sets - 5-10 reps - Prone Press Up  - 2 x daily - 7 x weekly - 1 sets - 10 reps - Standing Single Leg Stance with Counter Support  - 2 x daily - 7 x weekly - 1 sets - 3 reps - 10 sec hold - Sit to Stand  - 2 x daily - 7 x weekly - 1 sets - 10 reps  10/09/22: - Prone Hip Extension  - 2 x daily - 7 x weekly - 1 sets - 10 reps - 5" hold - Sidelying Hip Abduction  - 2 x daily - 7 x weekly - 1 sets - 10 reps - 5" hold - Supine Bridge  - 2 x daily - 7 x weekly - 1 sets - 10 reps - 5"  hold  ASSESSMENT:  CLINICAL IMPRESSION: 10/09/22:  Reviewed goals and educated importance of HEP compliance, pt stated she has began exercises though did require cueing when reviewing exercises.  Pt presents with hip instability noted with SLS.  Further testing of hip MMT revealed significant weakness of hip mm.  Added exercises to address weakness.  Pt with positive results with extension based exercises and reports of decreased numbness ending at knee (was at foot beginning of session).  Eval:  Patient is a 67 y.o. female who was seen today for physical therapy evaluation and treatment for Z96.652 (ICD-10-CM) - Status post total left knee replacement M17.12 (ICD-10-CM) - Primary osteoarthritis of left knee. Patient demonstrates muscle weakness, reduced ROM, and fascial restrictions which are likely contributing to symptoms of pain and are negatively impacting patient ability to perform ADLs and functional mobility tasks. Patient will benefit from skilled physical therapy services to address these deficits to reduce pain and improve level of function with ADLs and functional mobility tasks.   OBJECTIVE IMPAIRMENTS: Abnormal gait, decreased activity tolerance, decreased balance, decreased endurance, decreased knowledge of condition, decreased mobility, difficulty walking, decreased strength, increased fascial restrictions, and impaired perceived functional ability.   ACTIVITY LIMITATIONS: carrying, lifting, bending, sitting, standing, squatting, sleeping, and locomotion level  PARTICIPATION LIMITATIONS: meal prep, cleaning, laundry, shopping, community activity, and yard work  [facility] POTENTIAL: Good  CLINICAL DECISION MAKING: Stable/uncomplicated  EVALUATION COMPLEXITY: Low   GOALS: Goals reviewed with patient? No  SHORT TERM GOALS: Target date: 10/21/2022 patient will be independent with initial HEP  Baseline: Goal status: IN PROGRESS  2.  Patient will self report 30% improvement to  improve tolerance for functional activity  Baseline:  Goal status: IN PROGRESS  LONG TERM GOALS: Target date: 11/04/2022  Patient will be independent in self management strategies to improve quality of life and functional  outcomes.  Baseline:  Goal status: IN PROGRESS  2.  Patient will self report 50% improvement to improve tolerance for functional activity  Baseline:  Goal status: IN PROGRESS  3.  Patient will increase left leg MMTs to 5/5 without pain to promote return to ambulation community distances with minimal deviation.  Baseline: see above Goal status: IN PROGRESS  4.  Patient will remain free of falls Baseline:  Goal status: IN PROGRESS  5.  Patient will improve 5 times sit to stand score from 24.73 sec to 18 sec to demonstrate improved functional mobility and increased lower extremity strength.   Baseline:  Goal status: IN PROGRESS  6.  Patient will ambulate x 224 ft without AD and minimal gait deviation to improve safety with ambulating household distances. Baseline: ambulates with QC Goal status: IN PROGRESS   PLAN:  PT FREQUENCY: 2x/week  PT DURATION: 4 weeks  PLANNED INTERVENTIONS: Therapeutic exercises, Therapeutic activity, Neuromuscular re-education, Balance training, Gait training, Patient/Family education, Joint manipulation, Joint mobilization, Stair training, Orthotic/Fit training, DME instructions, Aquatic Therapy, Dry Needling, Electrical stimulation, Spinal manipulation, Spinal mobilization, Cryotherapy, Moist heat, Compression bandaging, scar mobilization, Splintting, Taping, Traction, Ultrasound, Ionotophoresis 4mg /ml Dexamethasone, and Manual therapy   PLAN FOR NEXT SESSION: question lumbar involvement; gait; lower extremity strengthening, balance  Becky Sax, LPTA/CLT; CBIS [phone]  Juel Burrow, PTA 10/09/2022, 2:52 PM  2:52 PM, 10/09/22

## 2022-10-14 ENCOUNTER — Ambulatory Visit (HOSPITAL_COMMUNITY): Payer: Federal, State, Local not specified - PPO | Attending: Orthopedic Surgery

## 2022-10-14 DIAGNOSIS — R29898 Other symptoms and signs involving the musculoskeletal system: Secondary | ICD-10-CM | POA: Insufficient documentation

## 2022-10-14 DIAGNOSIS — M25562 Pain in left knee: Secondary | ICD-10-CM | POA: Diagnosis present

## 2022-10-14 DIAGNOSIS — M25662 Stiffness of left knee, not elsewhere classified: Secondary | ICD-10-CM | POA: Insufficient documentation

## 2022-10-14 DIAGNOSIS — R2689 Other abnormalities of gait and mobility: Secondary | ICD-10-CM | POA: Diagnosis present

## 2022-10-14 DIAGNOSIS — R262 Difficulty in walking, not elsewhere classified: Secondary | ICD-10-CM | POA: Diagnosis present

## 2022-10-14 DIAGNOSIS — G8929 Other chronic pain: Secondary | ICD-10-CM | POA: Insufficient documentation

## 2022-10-14 DIAGNOSIS — M6281 Muscle weakness (generalized): Secondary | ICD-10-CM | POA: Diagnosis present

## 2022-10-14 NOTE — Therapy (Signed)
OUTPATIENT PHYSICAL THERAPY LOWER EXTREMITY TREATMENT   Patient Name: Pamela Stein MRN: [id number] DOB:04-03-1955, 67 y.o., female Today's Date: 10/14/2022  END OF SESSION:  PT End of Session - 10/14/22 1406     Visit Number 3    Number of Visits 8    Date for PT Re-Evaluation 11/04/22    Authorization Type BCBS    PT Start Time 1350    PT Stop Time 1430    PT Time Calculation (min) 40 min    Activity Tolerance Patient tolerated treatment well    Behavior During Therapy WFL for tasks assessed/performed            Past Medical History:  Diagnosis Date   Anxiety    Back pain    Bronchitis    Diabetes mellitus    Dizziness    Heart murmur    History of gout    Hyperlipidemia    Hypertension    Neuropathy    Obesity    Obstructive sleep apnea    CPAP   Past Surgical History:  Procedure Laterality Date   ABDOMINAL HYSTERECTOMY     [facility]   ADENOIDECTOMY     CATARACT EXTRACTION Left    right 02/2018   COLONOSCOPY  2008   diminutive rectal polyp, s/p removal. polypoid mucosa   COLONOSCOPY WITH PROPOFOL N/A 01/03/2018   Procedure: COLONOSCOPY WITH PROPOFOL;  Surgeon: Corbin Ade, MD;  [facility]: [facility];  Service: Endoscopy;  Laterality: N/A;  12:00pm   DILATION AND CURETTAGE OF UTERUS     ENDOMETRIAL ABLATION     EYE SURGERY Bilateral 12/04/2013   cataract   PARATHYROIDECTOMY N/A 12/22/2016   Procedure: PARATHYROIDECTOMY, NECK EXPLORATION;  Surgeon: Avel Peace, MD;  Location: [facility];  Service: General;  Laterality: N/A;   TONSILLECTOMY     TOTAL KNEE ARTHROPLASTY Left 05/28/2020   Procedure: LEFT TOTAL KNEE ARTHROPLASTY;  Surgeon: Vickki Hearing, MD;  Location: [facility];  Service: Orthopedics;  Laterality: Left;   WOUND EXPLORATION Left 05/31/2020   Procedure: WOUND EXPLORATION AND IRRIGATION LEFT KNEE;  Surgeon: Vickki Hearing, MD;  Location: [facility];  Service: Orthopedics;  Laterality: Left;  irrigation wound exploration, wound closure    Patient Active Problem List   Diagnosis Date Noted   S/P abdominal supracervical subtotal hysterectomy 10/08/2020   Encounter for screening fecal occult blood testing 10/08/2020   Encounter for gynecological examination with Papanicolaou smear of cervix 10/08/2020   Chronic pain of left knee 05/31/2020   Wound dehiscence, surgical    Status post total left knee replacement 05/28/2020   Primary osteoarthritis of left knee    Polyneuropathy due to secondary diabetes mellitus (HCC) 05/25/2019   Arthritis of right sacroiliac joint 05/25/2019   Cervical disc disorder 05/25/2019   Falls 05/25/2019   General unsteadiness 05/25/2019   Encounter for well woman exam with routine gynecological exam 01/09/2019   Screening for colorectal cancer 01/09/2019   Encounter for screening colonoscopy 12/13/2017   Hyperparathyroidism, primary (HCC) 12/22/2016   Vitamin D deficiency 10/31/2015   Hyperuricemia 05/08/2013   ONYCHOMYCOSIS, TOENAILS 02/22/2009   NEVI, MULTIPLE 02/22/2009   Diabetes mellitus (HCC) 01/25/2008   BACK PAIN WITH RADICULOPATHY 11/09/2007   Hyperlipidemia LDL goal <100 08/26/2007   Morbid obesity (HCC) 08/26/2007   Essential hypertension 08/26/2007   Obstructive sleep apnea 04/20/2007    PCP: Avon Gully  REFERRING PROVIDER: Vickki Hearing, MD  REFERRING DIAG: [phone] (ICD-10-CM) - Status post total left knee replacement  M17.12 (ICD-10-CM) - Primary osteoarthritis of left knee  THERAPY DIAG:  Difficulty in walking, not elsewhere classified  Stiffness of left knee, not elsewhere classified  Chronic pain of left knee  Other abnormalities of gait and mobility  Muscle weakness (generalized)  Rationale for Evaluation and Treatment: Rehabilitation  ONSET DATE: 05/2020  SUBJECTIVE:   SUBJECTIVE STATEMENT: 9/4//24: Patient still reports of stiffness on the knee. Patient still reports of unsteadiness. Denies any pain. Patient reports that she has a hard time  feeling the ground with her L feet. Patient reports that it happened since the surgery but she also reports that she has neuropathy and diabetes.  Eval:  History of left TKA in 2022 per Dr Romeo Apple.  Larey Seat in late July off a step; walks in today with QC; sometimes that left knee will give way; retired; reports walks with QC at baseline; wall walks at home; reports some numbness left lateral knee to foot  PERTINENT HISTORY: S/p 05/2020 PAIN:  Are you having pain?no pain  PRECAUTIONS: Fall    WEIGHT BEARING RESTRICTIONS: No  FALLS:  Has patient fallen in last 6 months? Yes. Number of falls 4    OCCUPATION: retired  PLOF: Independent;   PATIENT GOALS: get rid of my cane; walk without my cane  NEXT MD VISIT: as needed; sees Romeo Apple yearly  OBJECTIVE:   DIAGNOSTIC FINDINGS: X-ray report left knee   Status post left knee   X-ray shows no fracture dislocation loosening or malalignment   There may be some patella Baha   Impression possible patella Baha normal appearing implant in terms of no loosening and no malalignment  PATIENT SURVEYS:  FOTO 50  COGNITION: Overall cognitive status: Within functional limits for tasks assessed     SENSATION: WFL  EDEMA:  None noted   POSTURE: rounded shoulders and forward head  PALPATION: No tenderness noted left knee  LOWER EXTREMITY ROM:  Active ROM Right eval Left eval  Hip flexion    Hip extension    Hip abduction    Hip adduction    Hip internal rotation    Hip external rotation    Knee flexion    Knee extension    Ankle dorsiflexion    Ankle plantarflexion    Ankle inversion    Ankle eversion    Lumbar flexion 80% available    Lumbar extension 15% available     (Blank rows = not tested)  LOWER EXTREMITY MMT:  MMT Right eval Left eval Right 10/09/22 Left 10/09/22  Hip flexion 5 4+    Hip extension   3+ 3/5  Hip abduction   3+ 3/5  Hip adduction      Hip internal rotation      Hip external rotation       Knee flexion      Knee extension 5 4+    Ankle dorsiflexion 5 4    Ankle plantarflexion      Ankle inversion      Ankle eversion       (Blank rows = not tested)    FUNCTIONAL TESTS:  5 times sit to stand: 24.73 sec no UE assist  GAIT: Distance walked: 50 ft in clinic Assistive device utilized: Quad cane small base (adjusted at eval to appropriate height) Level of assistance: Modified independence Comments: slight antalgic gait   TODAY'S TREATMENT:  DATE:  10/14/22 Prone self L quads stretch with a strap x 30" x 3 Seated L hamstring stretch x 30" x 3 Seated hip adduction squeeze x 3" x 10 x 2 R Knee drives x 10 x 3 followed by a lunge on a 12 inch box x 30" on the last rep of each set R Lunges with lat pull on knee, RTB x 6" x 10 x 2 R standing TKE x 3" x 12 Gastrocnemius slant board stretch x 30" x 3  10/09/22: Reviewed goals Educated importance of HEP compliance for maximal benefits MMT Prone: press up 5x 10 (reports of decreased numbness following reps)  Hip extension Sidelying: abduction 10 Supine: bridge 10x 5" Seated: STS 10x eccentric control Standing: Lumbar extension 10x  Heel raise 10x  SLS Lt unable without finger assistance; Rt 2" max  10/07/2022 physical therapy evaluation and HEP instruction    PATIENT EDUCATION:  Education details: Updated HEP Person educated: Patient Education method: Explanation, Demonstration, and Handouts Education comprehension: verbalized understanding, returned demonstration, verbal cues required, and tactile cues required  HOME EXERCISE PROGRAM: Access Code: [email or web address] URL: [email or web address]  10/14/22 - Seated Hamstring Stretch  - 1-2 x daily - 7 x weekly - 3 reps - 30 hold - Seated Hip Adduction Isometrics with Ball  - 1-2 x daily - 7 x weekly - 2 sets - 10 reps - 6  hold  10/09/22: - Prone Hip Extension  - 2 x daily - 7 x weekly - 1 sets - 10 reps - 5" hold - Sidelying Hip Abduction  - 2 x daily - 7 x weekly - 1 sets - 10 reps - 5" hold - Supine Bridge  - 2 x daily - 7 x weekly - 1 sets - 10 reps - 5" hold  Date: 10/07/2022 Prepared by: [facility] - [facility]  Exercises - Standing Lumbar Extension with Counter  - 5 x daily - 7 x weekly - 1 sets - 5-10 reps - Prone Press Up  - 2 x daily - 7 x weekly - 1 sets - 10 reps - Standing Single Leg Stance with Counter Support  - 2 x daily - 7 x weekly - 1 sets - 3 reps - 10 sec hold - Sit to Stand  - 2 x daily - 7 x weekly - 1 sets - 10 reps  ASSESSMENT:  CLINICAL IMPRESSION: 10/14/22:  Interventions today were geared towards LE strengthening (to reduce excessive ER and hyperextension). Tolerated all activities with slight to mild difficulty due to weakness. Demonstrated appropriate levels of fatigue. Rest periods provided. Provided mod amount of cueing to ensure correct execution of activity with fair carry-over. To date, skilled PT is required to address the impairments and improve function.   Eval:  Patient is a 67 y.o. female who was seen today for physical therapy evaluation and treatment for Z96.652 (ICD-10-CM) - Status post total left knee replacement M17.12 (ICD-10-CM) - Primary osteoarthritis of left knee. Patient demonstrates muscle weakness, reduced ROM, and fascial restrictions which are likely contributing to symptoms of pain and are negatively impacting patient ability to perform ADLs and functional mobility tasks. Patient will benefit from skilled physical therapy services to address these deficits to reduce pain and improve level of function with ADLs and functional mobility tasks.   OBJECTIVE IMPAIRMENTS: Abnormal gait, decreased activity tolerance, decreased balance, decreased endurance, decreased knowledge of condition, decreased mobility, difficulty walking, decreased strength, increased fascial restrictions,  and impaired perceived functional ability.   ACTIVITY LIMITATIONS: carrying,  lifting, bending, sitting, standing, squatting, sleeping, and locomotion level  PARTICIPATION LIMITATIONS: meal prep, cleaning, laundry, shopping, community activity, and yard work  [facility] POTENTIAL: Good  CLINICAL DECISION MAKING: Stable/uncomplicated  EVALUATION COMPLEXITY: Low   GOALS: Goals reviewed with patient? No  SHORT TERM GOALS: Target date: 10/21/2022 patient will be independent with initial HEP  Baseline: Goal status: IN PROGRESS  2.  Patient will self report 30% improvement to improve tolerance for functional activity  Baseline:  Goal status: IN PROGRESS  LONG TERM GOALS: Target date: 11/04/2022  Patient will be independent in self management strategies to improve quality of life and functional outcomes.  Baseline:  Goal status: IN PROGRESS  2.  Patient will self report 50% improvement to improve tolerance for functional activity  Baseline:  Goal status: IN PROGRESS  3.  Patient will increase left leg MMTs to 5/5 without pain to promote return to ambulation community distances with minimal deviation.  Baseline: see above Goal status: IN PROGRESS  4.  Patient will remain free of falls Baseline:  Goal status: IN PROGRESS  5.  Patient will improve 5 times sit to stand score from 24.73 sec to 18 sec to demonstrate improved functional mobility and increased lower extremity strength.   Baseline:  Goal status: IN PROGRESS  6.  Patient will ambulate x 224 ft without AD and minimal gait deviation to improve safety with ambulating household distances. Baseline: ambulates with QC Goal status: IN PROGRESS   PLAN:  PT FREQUENCY: 2x/week  PT DURATION: 4 weeks  PLANNED INTERVENTIONS: Therapeutic exercises, Therapeutic activity, Neuromuscular re-education, Balance training, Gait training, Patient/Family education, Joint manipulation, Joint mobilization, Stair training, Orthotic/Fit  training, DME instructions, Aquatic Therapy, Dry Needling, Electrical stimulation, Spinal manipulation, Spinal mobilization, Cryotherapy, Moist heat, Compression bandaging, scar mobilization, Splintting, Taping, Traction, Ultrasound, Ionotophoresis 4mg /ml Dexamethasone, and Manual therapy   PLAN FOR NEXT SESSION: Continue POC and may progress as tolerated with emphasis on gait; lower extremity strengthening, balance  Iantha Fallen L. Verl Whitmore, PT, DPT, OCS Board-Certified Clinical Specialist in Orthopedic PT PT Compact Privilege # (): [id number] T 10/14/2022, 2:07 PM

## 2022-10-15 ENCOUNTER — Ambulatory Visit (HOSPITAL_COMMUNITY): Payer: Federal, State, Local not specified - PPO

## 2022-10-15 DIAGNOSIS — R29898 Other symptoms and signs involving the musculoskeletal system: Secondary | ICD-10-CM

## 2022-10-15 DIAGNOSIS — M6281 Muscle weakness (generalized): Secondary | ICD-10-CM

## 2022-10-15 DIAGNOSIS — R262 Difficulty in walking, not elsewhere classified: Secondary | ICD-10-CM | POA: Diagnosis not present

## 2022-10-15 DIAGNOSIS — G8929 Other chronic pain: Secondary | ICD-10-CM

## 2022-10-15 DIAGNOSIS — M25662 Stiffness of left knee, not elsewhere classified: Secondary | ICD-10-CM

## 2022-10-15 DIAGNOSIS — R2689 Other abnormalities of gait and mobility: Secondary | ICD-10-CM

## 2022-10-15 NOTE — Therapy (Signed)
OUTPATIENT PHYSICAL THERAPY LOWER EXTREMITY TREATMENT   Patient Name: Pamela Stein MRN: [id number] DOB:Apr 12, 1955, 67 y.o., female Today's Date: 10/15/2022  END OF SESSION:  PT End of Session - 10/15/22 0904     Visit Number 4    Number of Visits 8    Date for PT Re-Evaluation 11/04/22    Authorization Type [employer]    PT Start Time 0903    PT Stop Time 0943    PT Time Calculation (min) 40 min    Activity Tolerance Patient tolerated treatment well    Behavior During Therapy [facility] for tasks assessed/performed            Past Medical History:  Diagnosis Date   Anxiety    Back pain    Bronchitis    Diabetes mellitus    Dizziness    Heart murmur    History of gout    Hyperlipidemia    Hypertension    Neuropathy    Obesity    Obstructive sleep apnea    CPAP   Past Surgical History:  Procedure Laterality Date   ABDOMINAL HYSTERECTOMY     [facility]   ADENOIDECTOMY     CATARACT EXTRACTION Left    right 02/2018   COLONOSCOPY  2008   diminutive rectal polyp, s/p removal. polypoid mucosa   COLONOSCOPY WITH PROPOFOL N/A 01/03/2018   Procedure: COLONOSCOPY WITH PROPOFOL;  Surgeon: Corbin Ade, MD;  Location: [facility];  Service: Endoscopy;  Laterality: N/A;  12:00pm   DILATION AND CURETTAGE OF UTERUS     ENDOMETRIAL ABLATION     EYE SURGERY Bilateral 12/04/2013   cataract   PARATHYROIDECTOMY N/A 12/22/2016   Procedure: PARATHYROIDECTOMY, NECK EXPLORATION;  Surgeon: Avel Peace, MD;  Location: [facility];  Service: General;  Laterality: N/A;   TONSILLECTOMY     TOTAL KNEE ARTHROPLASTY Left 05/28/2020   Procedure: LEFT TOTAL KNEE ARTHROPLASTY;  Surgeon: Vickki Hearing, MD;  Location: [facility];  Service: Orthopedics;  Laterality: Left;   WOUND EXPLORATION Left 05/31/2020   Procedure: WOUND EXPLORATION AND IRRIGATION LEFT KNEE;  Surgeon: Vickki Hearing, MD;  Location: [facility];  Service: Orthopedics;  Laterality: Left;  irrigation wound exploration, wound closure    Patient Active Problem List   Diagnosis Date Noted   S/P abdominal supracervical subtotal hysterectomy 10/08/2020   Encounter for screening fecal occult blood testing 10/08/2020   Encounter for gynecological examination with Papanicolaou smear of cervix 10/08/2020   Chronic pain of left knee 05/31/2020   Wound dehiscence, surgical    Status post total left knee replacement 05/28/2020   Primary osteoarthritis of left knee    Polyneuropathy due to secondary diabetes mellitus (HCC) 05/25/2019   Arthritis of right sacroiliac joint 05/25/2019   Cervical disc disorder 05/25/2019   Falls 05/25/2019   General unsteadiness 05/25/2019   Encounter for well woman exam with routine gynecological exam 01/09/2019   Screening for colorectal cancer 01/09/2019   Encounter for screening colonoscopy 12/13/2017   Hyperparathyroidism, primary (HCC) 12/22/2016   Vitamin D deficiency 10/31/2015   Hyperuricemia 05/08/2013   ONYCHOMYCOSIS, TOENAILS 02/22/2009   NEVI, MULTIPLE 02/22/2009   Diabetes mellitus (HCC) 01/25/2008   BACK PAIN WITH RADICULOPATHY 11/09/2007   Hyperlipidemia LDL goal <100 08/26/2007   Morbid obesity (HCC) 08/26/2007   Essential hypertension 08/26/2007   Obstructive sleep apnea 04/20/2007    PCP: Avon Gully  REFERRING PROVIDER: Vickki Hearing, MD  REFERRING DIAG: [phone] (ICD-10-CM) - Status post total left knee replacement  M17.12 (ICD-10-CM) - Primary osteoarthritis of left knee  THERAPY DIAG:  Difficulty in walking, not elsewhere classified  Stiffness of left knee, not elsewhere classified  Chronic pain of left knee  Other abnormalities of gait and mobility  Weakness of left leg  Muscle weakness (generalized)  Rationale for Evaluation and Treatment: Rehabilitation  ONSET DATE: 05/2020  SUBJECTIVE:   SUBJECTIVE STATEMENT: No pain complaint today; reports stiffness only; wearing a knee compression brace today  Eval:  History of left TKA in 2022 per Dr  Romeo Apple.  Larey Seat in late July off a step; walks in today with QC; sometimes that left knee will give way; retired; reports walks with QC at baseline; wall walks at home; reports some numbness left lateral knee to foot  PERTINENT HISTORY: S/p 05/2020 PAIN:  Are you having pain?no pain  PRECAUTIONS: Fall    WEIGHT BEARING RESTRICTIONS: No  FALLS:  Has patient fallen in last 6 months? Yes. Number of falls 4    OCCUPATION: retired  PLOF: Independent;   PATIENT GOALS: get rid of my cane; walk without my cane  NEXT MD VISIT: as needed; sees Romeo Apple yearly  OBJECTIVE:   DIAGNOSTIC FINDINGS: X-ray report left knee   Status post left knee   X-ray shows no fracture dislocation loosening or malalignment   There may be some patella Baha   Impression possible patella Baha normal appearing implant in terms of no loosening and no malalignment  PATIENT SURVEYS:  FOTO 50  COGNITION: Overall cognitive status: Within functional limits for tasks assessed     SENSATION: WFL  EDEMA:  None noted   POSTURE: rounded shoulders and forward head  PALPATION: No tenderness noted left knee  LOWER EXTREMITY ROM:  Active ROM Right eval Left eval  Hip flexion    Hip extension    Hip abduction    Hip adduction    Hip internal rotation    Hip external rotation    Knee flexion    Knee extension    Ankle dorsiflexion    Ankle plantarflexion    Ankle inversion    Ankle eversion    Lumbar flexion 80% available    Lumbar extension 15% available     (Blank rows = not tested)  LOWER EXTREMITY MMT:  MMT Right eval Left eval Right 10/09/22 Left 10/09/22  Hip flexion 5 4+    Hip extension   3+ 3/5  Hip abduction   3+ 3/5  Hip adduction      Hip internal rotation      Hip external rotation      Knee flexion      Knee extension 5 4+    Ankle dorsiflexion 5 4    Ankle plantarflexion      Ankle inversion      Ankle eversion       (Blank rows = not tested)    FUNCTIONAL  TESTS:  5 times sit to stand: 24.73 sec no UE assist  GAIT: Distance walked: 50 ft in clinic Assistive device utilized: Quad cane small base (adjusted at eval to appropriate height) Level of assistance: Modified independence Comments: slight antalgic gait   TODAY'S TREATMENT:  DATE:  10/15/22 Nustep seat 8 level 2 dynamic warm up x 5'  Sitting: Hamstring stretch left 3 x 20"  Standing: Lumbar extension x 10 over bar Heel raises 2 x 10 Toe raises 2 x 10 Slant board 5 x 20" 12" step left knee drives for flexion x 2' Hip vectors 3" hold x 5 each    10/14/22 Prone self L quads stretch with a strap x 30" x 3 Seated L hamstring stretch x 30" x 3 Seated hip adduction squeeze x 3" x 10 x 2 R Knee drives x 10 x 3 followed by a lunge on a 12 inch box x 30" on the last rep of each set R Lunges with lat pull on knee, RTB x 6" x 10 x 2 R standing TKE x 3" x 12 Gastrocnemius slant board stretch x 30" x 3  10/09/22: Reviewed goals Educated importance of HEP compliance for maximal benefits MMT Prone: press up 5x 10 (reports of decreased numbness following reps)  Hip extension Sidelying: abduction 10 Supine: bridge 10x 5" Seated: STS 10x eccentric control Standing: Lumbar extension 10x  Heel raise 10x  SLS Lt unable without finger assistance; Rt 2" max  10/07/2022 physical therapy evaluation and HEP instruction    PATIENT EDUCATION:  Education details: Updated HEP Person educated: Patient Education method: Explanation, Demonstration, and Handouts Education comprehension: verbalized understanding, returned demonstration, verbal cues required, and tactile cues required  HOME EXERCISE PROGRAM: Access Code: [email or web address] URL: [email or web address]  10/15/22 hip vectors  10/14/22 - Seated Hamstring Stretch  - 1-2 x daily - 7 x weekly - 3 reps - 30  hold - Seated Hip Adduction Isometrics with Ball  - 1-2 x daily - 7 x weekly - 2 sets - 10 reps - 6 hold  10/09/22: - Prone Hip Extension  - 2 x daily - 7 x weekly - 1 sets - 10 reps - 5" hold - Sidelying Hip Abduction  - 2 x daily - 7 x weekly - 1 sets - 10 reps - 5" hold - Supine Bridge  - 2 x daily - 7 x weekly - 1 sets - 10 reps - 5" hold  Date: 10/07/2022 Prepared by: [facility] - [facility]  Exercises - Standing Lumbar Extension with Counter  - 5 x daily - 7 x weekly - 1 sets - 5-10 reps - Prone Press Up  - 2 x daily - 7 x weekly - 1 sets - 10 reps - Standing Single Leg Stance with Counter Support  - 2 x daily - 7 x weekly - 1 sets - 3 reps - 10 sec hold - Sit to Stand  - 2 x daily - 7 x weekly - 1 sets - 10 reps  ASSESSMENT:  CLINICAL IMPRESSION: 10/15/22 treatment today with continued focus on left leg mobility  and strength.  She continues with noted decreased control left leg.  Hyperextension of left knee.  Needs cues with knee drive for flexion to avoid external rotation of the left hip.  Added hip vectors for core/hip and glute strength and balance and adde to HEP.  Needed cues for maintaining good posturing with vectors.  Appropriate fatigue after treatment today.  To date, skilled PT is required to address the impairments and improve function.   Eval:  Patient is a 67 y.o. female who was seen today for physical therapy evaluation and treatment for Z96.652 (ICD-10-CM) - Status post total left knee replacement M17.12 (ICD-10-CM) - Primary osteoarthritis of left knee. Patient demonstrates  muscle weakness, reduced ROM, and fascial restrictions which are likely contributing to symptoms of pain and are negatively impacting patient ability to perform ADLs and functional mobility tasks. Patient will benefit from skilled physical therapy services to address these deficits to reduce pain and improve level of function with ADLs and functional mobility tasks.   OBJECTIVE IMPAIRMENTS: Abnormal gait,  decreased activity tolerance, decreased balance, decreased endurance, decreased knowledge of condition, decreased mobility, difficulty walking, decreased strength, increased fascial restrictions, and impaired perceived functional ability.   ACTIVITY LIMITATIONS: carrying, lifting, bending, sitting, standing, squatting, sleeping, and locomotion level  PARTICIPATION LIMITATIONS: meal prep, cleaning, laundry, shopping, community activity, and yard work  [facility] POTENTIAL: Good  CLINICAL DECISION MAKING: Stable/uncomplicated  EVALUATION COMPLEXITY: Low   GOALS: Goals reviewed with patient? No  SHORT TERM GOALS: Target date: 10/21/2022 patient will be independent with initial HEP  Baseline: Goal status: IN PROGRESS  2.  Patient will self report 30% improvement to improve tolerance for functional activity  Baseline:  Goal status: IN PROGRESS  LONG TERM GOALS: Target date: 11/04/2022  Patient will be independent in self management strategies to improve quality of life and functional outcomes.  Baseline:  Goal status: IN PROGRESS  2.  Patient will self report 50% improvement to improve tolerance for functional activity  Baseline:  Goal status: IN PROGRESS  3.  Patient will increase left leg MMTs to 5/5 without pain to promote return to ambulation community distances with minimal deviation.  Baseline: see above Goal status: IN PROGRESS  4.  Patient will remain free of falls Baseline:  Goal status: IN PROGRESS  5.  Patient will improve 5 times sit to stand score from 24.73 sec to 18 sec to demonstrate improved functional mobility and increased lower extremity strength.   Baseline:  Goal status: IN PROGRESS  6.  Patient will ambulate x 224 ft without AD and minimal gait deviation to improve safety with ambulating household distances. Baseline: ambulates with QC Goal status: IN PROGRESS   PLAN:  PT FREQUENCY: 2x/week  PT DURATION: 4 weeks  PLANNED INTERVENTIONS:  Therapeutic exercises, Therapeutic activity, Neuromuscular re-education, Balance training, Gait training, Patient/Family education, Joint manipulation, Joint mobilization, Stair training, Orthotic/Fit training, DME instructions, Aquatic Therapy, Dry Needling, Electrical stimulation, Spinal manipulation, Spinal mobilization, Cryotherapy, Moist heat, Compression bandaging, scar mobilization, Splintting, Taping, Traction, Ultrasound, Ionotophoresis 4mg /ml Dexamethasone, and Manual therapy   PLAN FOR NEXT SESSION: Continue POC and may progress as tolerated with emphasis on gait; lower extremity strengthening, balance  9:40 AM, 10/15/22 Orian Amberg Small Ivon Oelkers MPT [facility] [place] [phone] Ph:[phone]

## 2022-10-20 ENCOUNTER — Ambulatory Visit (HOSPITAL_COMMUNITY): Payer: Federal, State, Local not specified - PPO

## 2022-10-20 ENCOUNTER — Encounter (HOSPITAL_COMMUNITY): Payer: Self-pay

## 2022-10-20 ENCOUNTER — Encounter (HOSPITAL_COMMUNITY): Payer: Federal, State, Local not specified - PPO | Admitting: Physical Therapy

## 2022-10-20 DIAGNOSIS — M25662 Stiffness of left knee, not elsewhere classified: Secondary | ICD-10-CM

## 2022-10-20 DIAGNOSIS — M6281 Muscle weakness (generalized): Secondary | ICD-10-CM

## 2022-10-20 DIAGNOSIS — R262 Difficulty in walking, not elsewhere classified: Secondary | ICD-10-CM

## 2022-10-20 DIAGNOSIS — R2689 Other abnormalities of gait and mobility: Secondary | ICD-10-CM

## 2022-10-20 DIAGNOSIS — R29898 Other symptoms and signs involving the musculoskeletal system: Secondary | ICD-10-CM

## 2022-10-20 DIAGNOSIS — G8929 Other chronic pain: Secondary | ICD-10-CM

## 2022-10-20 NOTE — Therapy (Signed)
OUTPATIENT PHYSICAL THERAPY LOWER EXTREMITY TREATMENT   Patient Name: Pamela Stein MRN: [id number] DOB:07/13/1955, 67 y.o., female Today's Date: 10/20/2022  END OF SESSION:  PT End of Session - 10/20/22 0932     Visit Number 5    Number of Visits 8    Date for PT Re-Evaluation 11/04/22    Authorization Type BCBS    PT Start Time [phone]    PT Stop Time 1010    PT Time Calculation (min) 45 min    Activity Tolerance Patient tolerated treatment well    Behavior During Therapy WFL for tasks assessed/performed             Past Medical History:  Diagnosis Date   Anxiety    Back pain    Bronchitis    Diabetes mellitus    Dizziness    Heart murmur    History of gout    Hyperlipidemia    Hypertension    Neuropathy    Obesity    Obstructive sleep apnea    CPAP   Past Surgical History:  Procedure Laterality Date   ABDOMINAL HYSTERECTOMY     [facility]   ADENOIDECTOMY     CATARACT EXTRACTION Left    right 02/2018   COLONOSCOPY  2008   diminutive rectal polyp, s/p removal. polypoid mucosa   COLONOSCOPY WITH PROPOFOL N/A 01/03/2018   Procedure: COLONOSCOPY WITH PROPOFOL;  Surgeon: Corbin Ade, MD;  Location: [facility];  Service: Endoscopy;  Laterality: N/A;  12:00pm   DILATION AND CURETTAGE OF UTERUS     ENDOMETRIAL ABLATION     EYE SURGERY Bilateral 12/04/2013   cataract   PARATHYROIDECTOMY N/A 12/22/2016   Procedure: PARATHYROIDECTOMY, NECK EXPLORATION;  Surgeon: Avel Peace, MD;  Location: [facility];  Service: General;  Laterality: N/A;   TONSILLECTOMY     TOTAL KNEE ARTHROPLASTY Left 05/28/2020   Procedure: LEFT TOTAL KNEE ARTHROPLASTY;  Surgeon: Vickki Hearing, MD;  Location: [facility];  Service: Orthopedics;  Laterality: Left;   WOUND EXPLORATION Left 05/31/2020   Procedure: WOUND EXPLORATION AND IRRIGATION LEFT KNEE;  Surgeon: Vickki Hearing, MD;  Location: [facility];  Service: Orthopedics;  Laterality: Left;  irrigation wound exploration, wound  closure   Patient Active Problem List   Diagnosis Date Noted   S/P abdominal supracervical subtotal hysterectomy 10/08/2020   Encounter for screening fecal occult blood testing 10/08/2020   Encounter for gynecological examination with Papanicolaou smear of cervix 10/08/2020   Chronic pain of left knee 05/31/2020   Wound dehiscence, surgical    Status post total left knee replacement 05/28/2020   Primary osteoarthritis of left knee    Polyneuropathy due to secondary diabetes mellitus (HCC) 05/25/2019   Arthritis of right sacroiliac joint 05/25/2019   Cervical disc disorder 05/25/2019   Falls 05/25/2019   General unsteadiness 05/25/2019   Encounter for well woman exam with routine gynecological exam 01/09/2019   Screening for colorectal cancer 01/09/2019   Encounter for screening colonoscopy 12/13/2017   Hyperparathyroidism, primary (HCC) 12/22/2016   Vitamin D deficiency 10/31/2015   Hyperuricemia 05/08/2013   ONYCHOMYCOSIS, TOENAILS 02/22/2009   NEVI, MULTIPLE 02/22/2009   Diabetes mellitus (HCC) 01/25/2008   BACK PAIN WITH RADICULOPATHY 11/09/2007   Hyperlipidemia LDL goal <100 08/26/2007   Morbid obesity (HCC) 08/26/2007   Essential hypertension 08/26/2007   Obstructive sleep apnea 04/20/2007    PCP: Avon Gully  REFERRING PROVIDER: Vickki Hearing, MD  REFERRING DIAG: [phone] (ICD-10-CM) - Status post total left knee  replacement M17.12 (ICD-10-CM) - Primary osteoarthritis of left knee  THERAPY DIAG:  Difficulty in walking, not elsewhere classified  Stiffness of left knee, not elsewhere classified  Chronic pain of left knee  Other abnormalities of gait and mobility  Weakness of left leg  Muscle weakness (generalized)  Rationale for Evaluation and Treatment: Rehabilitation  ONSET DATE: 05/2020  SUBJECTIVE:   SUBJECTIVE STATEMENT: No reports of pain.  Stated compliance with HEP.  Stated balance is most difficulty.    Eval:  History of left TKA in 2022  per Dr Romeo Apple.  Larey Seat in late July off a step; walks in today with QC; sometimes that left knee will give way; retired; reports walks with QC at baseline; wall walks at home; reports some numbness left lateral knee to foot  PERTINENT HISTORY: S/p 05/2020 PAIN:  Are you having pain?no pain  PRECAUTIONS: Fall    WEIGHT BEARING RESTRICTIONS: No  FALLS:  Has patient fallen in last 6 months? Yes. Number of falls 4    OCCUPATION: retired  PLOF: Independent;   PATIENT GOALS: get rid of my cane; walk without my cane  NEXT MD VISIT: as needed; sees Romeo Apple yearly  OBJECTIVE:   DIAGNOSTIC FINDINGS: X-ray report left knee   Status post left knee   X-ray shows no fracture dislocation loosening or malalignment   There may be some patella Baha   Impression possible patella Baha normal appearing implant in terms of no loosening and no malalignment  PATIENT SURVEYS:  FOTO 50  COGNITION: Overall cognitive status: Within functional limits for tasks assessed     SENSATION: WFL  EDEMA:  None noted   POSTURE: rounded shoulders and forward head  PALPATION: No tenderness noted left knee  LOWER EXTREMITY ROM:  Active ROM Right eval Left eval  Hip flexion    Hip extension    Hip abduction    Hip adduction    Hip internal rotation    Hip external rotation    Knee flexion    Knee extension    Ankle dorsiflexion    Ankle plantarflexion    Ankle inversion    Ankle eversion    Lumbar flexion 80% available    Lumbar extension 15% available     (Blank rows = not tested)  LOWER EXTREMITY MMT:  MMT Right eval Left eval Right 10/09/22 Left 10/09/22  Hip flexion 5 4+    Hip extension   3+ 3/5  Hip abduction   3+ 3/5  Hip adduction      Hip internal rotation      Hip external rotation      Knee flexion      Knee extension 5 4+    Ankle dorsiflexion 5 4    Ankle plantarflexion      Ankle inversion      Ankle eversion       (Blank rows = not  tested)    FUNCTIONAL TESTS:  5 times sit to stand: 24.73 sec no UE assist  GAIT: Distance walked: 50 ft in clinic Assistive device utilized: Quad cane small base (adjusted at eval to appropriate height) Level of assistance: Modified independence Comments: slight antalgic gait   TODAY'S TREATMENT:  DATE:  10/20/22 Nustep seat 8 level 2 dynamic warm up x 5'  10 STS controlled eccentric control with no HHA Heel raise incline slope 2x 10 Toe raises decline slope 2x 10 Squat 10x front of chair Vector stance 3x 5" with 1 HHA Sidestep with GTB around thigh- cueing to reduce Lt LE ER 2RT inside // bars minimal HHA Forward step up 4in 10x with 2 HHA- cueing to reduce hyperextension and mechanics  AROM 0-120 degrees   10/15/22 Nustep seat 8 level 2 dynamic warm up x 5'  Sitting: Hamstring stretch left 3 x 20"  Standing: Lumbar extension x 10 over bar Heel raises 2 x 10 Toe raises 2 x 10 Slant board 5 x 20" 12" step left knee drives for flexion x 2' Hip vectors 3" hold x 5 each    10/14/22 Prone self L quads stretch with a strap x 30" x 3 Seated L hamstring stretch x 30" x 3 Seated hip adduction squeeze x 3" x 10 x 2 R Knee drives x 10 x 3 followed by a lunge on a 12 inch box x 30" on the last rep of each set R Lunges with lat pull on knee, RTB x 6" x 10 x 2 R standing TKE x 3" x 12 Gastrocnemius slant board stretch x 30" x 3  10/09/22: Reviewed goals Educated importance of HEP compliance for maximal benefits MMT Prone: press up 5x 10 (reports of decreased numbness following reps)  Hip extension Sidelying: abduction 10 Supine: bridge 10x 5" Seated: STS 10x eccentric control Standing: Lumbar extension 10x  Heel raise 10x  SLS Lt unable without finger assistance; Rt 2" max  10/07/2022 physical therapy evaluation and HEP instruction     PATIENT EDUCATION:  Education details: Updated HEP Person educated: Patient Education method: Explanation, Demonstration, and Handouts Education comprehension: verbalized understanding, returned demonstration, verbal cues required, and tactile cues required  HOME EXERCISE PROGRAM: Access Code: [email or web address] URL: [email or web address]  10/15/22 hip vectors  10/14/22 - Seated Hamstring Stretch  - 1-2 x daily - 7 x weekly - 3 reps - 30 hold - Seated Hip Adduction Isometrics with Ball  - 1-2 x daily - 7 x weekly - 2 sets - 10 reps - 6 hold  10/09/22: - Prone Hip Extension  - 2 x daily - 7 x weekly - 1 sets - 10 reps - 5" hold - Sidelying Hip Abduction  - 2 x daily - 7 x weekly - 1 sets - 10 reps - 5" hold - Supine Bridge  - 2 x daily - 7 x weekly - 1 sets - 10 reps - 5" hold  Date: 10/07/2022 Prepared by: [facility]  Exercises - Standing Lumbar Extension with Counter  - 5 x daily - 7 x weekly - 1 sets - 5-10 reps - Prone Press Up  - 2 x daily - 7 x weekly - 1 sets - 10 reps - Standing Single Leg Stance with Counter Support  - 2 x daily - 7 x weekly - 1 sets - 3 reps - 10 sec hold - Sit to Stand  - 2 x daily - 7 x weekly - 1 sets - 10 reps  ASSESSMENT:  CLINICAL IMPRESSION: 10/20/22:  Session focus with hip stability and functional strengthening.  Pt presents with hip and knee weakness, cueing required to avoid external rotation and hyperextension of Lt knee/hip through session.  Added sidestep with resistance for gluteal strengthening.  Pt tolerated well towards session with  no reports of pain, was limited by fatigue with rest breaks required through session.  Eval:  Patient is a 67 y.o. female who was seen today for physical therapy evaluation and treatment for Z96.652 (ICD-10-CM) - Status post total left knee replacement M17.12 (ICD-10-CM) - Primary osteoarthritis of left knee. Patient demonstrates muscle weakness, reduced ROM, and fascial restrictions which are likely  contributing to symptoms of pain and are negatively impacting patient ability to perform ADLs and functional mobility tasks. Patient will benefit from skilled physical therapy services to address these deficits to reduce pain and improve level of function with ADLs and functional mobility tasks.   OBJECTIVE IMPAIRMENTS: Abnormal gait, decreased activity tolerance, decreased balance, decreased endurance, decreased knowledge of condition, decreased mobility, difficulty walking, decreased strength, increased fascial restrictions, and impaired perceived functional ability.   ACTIVITY LIMITATIONS: carrying, lifting, bending, sitting, standing, squatting, sleeping, and locomotion level  PARTICIPATION LIMITATIONS: meal prep, cleaning, laundry, shopping, community activity, and yard work  [facility] POTENTIAL: Good  CLINICAL DECISION MAKING: Stable/uncomplicated  EVALUATION COMPLEXITY: Low   GOALS: Goals reviewed with patient? No  SHORT TERM GOALS: Target date: 10/21/2022 patient will be independent with initial HEP  Baseline: Goal status: IN PROGRESS  2.  Patient will self report 30% improvement to improve tolerance for functional activity  Baseline:  Goal status: IN PROGRESS  LONG TERM GOALS: Target date: 11/04/2022  Patient will be independent in self management strategies to improve quality of life and functional outcomes.  Baseline:  Goal status: IN PROGRESS  2.  Patient will self report 50% improvement to improve tolerance for functional activity  Baseline:  Goal status: IN PROGRESS  3.  Patient will increase left leg MMTs to 5/5 without pain to promote return to ambulation community distances with minimal deviation.  Baseline: see above Goal status: IN PROGRESS  4.  Patient will remain free of falls Baseline:  Goal status: IN PROGRESS  5.  Patient will improve 5 times sit to stand score from 24.73 sec to 18 sec to demonstrate improved functional mobility and increased  lower extremity strength.   Baseline:  Goal status: IN PROGRESS  6.  Patient will ambulate x 224 ft without AD and minimal gait deviation to improve safety with ambulating household distances. Baseline: ambulates with QC Goal status: IN PROGRESS   PLAN:  PT FREQUENCY: 2x/week  PT DURATION: 4 weeks  PLANNED INTERVENTIONS: Therapeutic exercises, Therapeutic activity, Neuromuscular re-education, Balance training, Gait training, Patient/Family education, Joint manipulation, Joint mobilization, Stair training, Orthotic/Fit training, DME instructions, Aquatic Therapy, Dry Needling, Electrical stimulation, Spinal manipulation, Spinal mobilization, Cryotherapy, Moist heat, Compression bandaging, scar mobilization, Splintting, Taping, Traction, Ultrasound, Ionotophoresis 4mg /ml Dexamethasone, and Manual therapy   PLAN FOR NEXT SESSION: Continue POC and may progress as tolerated with emphasis on gait; lower extremity strengthening, balance   Becky Sax, LPTA/CLT; CBIS [phone]  Juel Burrow, PTA 10/20/2022, 10:21 AM  10:21 AM, 10/20/22

## 2022-10-22 ENCOUNTER — Ambulatory Visit (HOSPITAL_COMMUNITY): Payer: Federal, State, Local not specified - PPO | Admitting: Physical Therapy

## 2022-10-22 DIAGNOSIS — M25662 Stiffness of left knee, not elsewhere classified: Secondary | ICD-10-CM

## 2022-10-22 DIAGNOSIS — R29898 Other symptoms and signs involving the musculoskeletal system: Secondary | ICD-10-CM

## 2022-10-22 DIAGNOSIS — R262 Difficulty in walking, not elsewhere classified: Secondary | ICD-10-CM

## 2022-10-22 DIAGNOSIS — R2689 Other abnormalities of gait and mobility: Secondary | ICD-10-CM

## 2022-10-22 DIAGNOSIS — M6281 Muscle weakness (generalized): Secondary | ICD-10-CM

## 2022-10-22 DIAGNOSIS — G8929 Other chronic pain: Secondary | ICD-10-CM

## 2022-10-22 NOTE — Therapy (Signed)
OUTPATIENT PHYSICAL THERAPY LOWER EXTREMITY TREATMENT   Patient Name: Pamela Stein MRN: [id number] DOB:1955-09-28, 67 y.o., female Today's Date: 10/22/2022  END OF SESSION:  PT End of Session - 10/22/22 1002     Visit Number 6    Number of Visits 8    Date for PT Re-Evaluation 11/04/22    Authorization Type BCBS    PT Start Time 1000    PT Stop Time 1030    PT Time Calculation (min) 30 min    Activity Tolerance Patient tolerated treatment well    Behavior During Therapy WFL for tasks assessed/performed             Past Medical History:  Diagnosis Date   Anxiety    Back pain    Bronchitis    Diabetes mellitus    Dizziness    Heart murmur    History of gout    Hyperlipidemia    Hypertension    Neuropathy    Obesity    Obstructive sleep apnea    CPAP   Past Surgical History:  Procedure Laterality Date   ABDOMINAL HYSTERECTOMY     [facility]   ADENOIDECTOMY     CATARACT EXTRACTION Left    right 02/2018   COLONOSCOPY  2008   diminutive rectal polyp, s/p removal. polypoid mucosa   COLONOSCOPY WITH PROPOFOL N/A 01/03/2018   Procedure: COLONOSCOPY WITH PROPOFOL;  Surgeon: Corbin Ade, MD;  Location: [facility];  Service: Endoscopy;  Laterality: N/A;  12:00pm   DILATION AND CURETTAGE OF UTERUS     ENDOMETRIAL ABLATION     EYE SURGERY Bilateral 12/04/2013   cataract   PARATHYROIDECTOMY N/A 12/22/2016   Procedure: PARATHYROIDECTOMY, NECK EXPLORATION;  Surgeon: Avel Peace, MD;  Location: [facility];  Service: General;  Laterality: N/A;   TONSILLECTOMY     TOTAL KNEE ARTHROPLASTY Left 05/28/2020   Procedure: LEFT TOTAL KNEE ARTHROPLASTY;  Surgeon: Vickki Hearing, MD;  Location: [facility];  Service: Orthopedics;  Laterality: Left;   WOUND EXPLORATION Left 05/31/2020   Procedure: WOUND EXPLORATION AND IRRIGATION LEFT KNEE;  Surgeon: Vickki Hearing, MD;  Location: [facility];  Service: Orthopedics;  Laterality: Left;  irrigation wound exploration, wound  closure   Patient Active Problem List   Diagnosis Date Noted   S/P abdominal supracervical subtotal hysterectomy 10/08/2020   Encounter for screening fecal occult blood testing 10/08/2020   Encounter for gynecological examination with Papanicolaou smear of cervix 10/08/2020   Chronic pain of left knee 05/31/2020   Wound dehiscence, surgical    Status post total left knee replacement 05/28/2020   Primary osteoarthritis of left knee    Polyneuropathy due to secondary diabetes mellitus (HCC) 05/25/2019   Arthritis of right sacroiliac joint 05/25/2019   Cervical disc disorder 05/25/2019   Falls 05/25/2019   General unsteadiness 05/25/2019   Encounter for well woman exam with routine gynecological exam 01/09/2019   Screening for colorectal cancer 01/09/2019   Encounter for screening colonoscopy 12/13/2017   Hyperparathyroidism, primary (HCC) 12/22/2016   Vitamin D deficiency 10/31/2015   Hyperuricemia 05/08/2013   ONYCHOMYCOSIS, TOENAILS 02/22/2009   NEVI, MULTIPLE 02/22/2009   Diabetes mellitus (HCC) 01/25/2008   BACK PAIN WITH RADICULOPATHY 11/09/2007   Hyperlipidemia LDL goal <100 08/26/2007   Morbid obesity (HCC) 08/26/2007   Essential hypertension 08/26/2007   Obstructive sleep apnea 04/20/2007    PCP: Avon Gully  REFERRING PROVIDER: Vickki Hearing, MD  REFERRING DIAG: [phone] (ICD-10-CM) - Status post total left knee  replacement M17.12 (ICD-10-CM) - Primary osteoarthritis of left knee  THERAPY DIAG:  Difficulty in walking, not elsewhere classified  Stiffness of left knee, not elsewhere classified  Chronic pain of left knee  Other abnormalities of gait and mobility  Weakness of left leg  Muscle weakness (generalized)  Rationale for Evaluation and Treatment: Rehabilitation  ONSET DATE: 05/2020  SUBJECTIVE:   SUBJECTIVE STATEMENT: No reports of pain.  Stated compliance with HEP but she is not doing them everyday.   Eval:  History of left TKA in 2022  per Dr Romeo Apple.  Larey Seat in late July off a step; walks in today with QC; sometimes that left knee will give way; retired; reports walks with QC at baseline; wall walks at home; reports some numbness left lateral knee to foot  PERTINENT HISTORY: S/p 05/2020 PAIN:  Are you having pain?no pain  PRECAUTIONS: Fall    WEIGHT BEARING RESTRICTIONS: No  FALLS:  Has patient fallen in last 6 months? Yes. Number of falls 4    OCCUPATION: retired  PLOF: Independent;   PATIENT GOALS: get rid of my cane; walk without my cane  NEXT MD VISIT: as needed; sees Romeo Apple yearly  OBJECTIVE:   DIAGNOSTIC FINDINGS: X-ray report left knee   Status post left knee   X-ray shows no fracture dislocation loosening or malalignment   There may be some patella Baha   Impression possible patella Baha normal appearing implant in terms of no loosening and no malalignment  PATIENT SURVEYS:  FOTO 50  COGNITION: Overall cognitive status: Within functional limits for tasks assessed     SENSATION: WFL  EDEMA:  None noted   POSTURE: rounded shoulders and forward head  PALPATION: No tenderness noted left knee  LOWER EXTREMITY ROM:  Active ROM Right eval Left eval  Hip flexion    Hip extension    Hip abduction    Hip adduction    Hip internal rotation    Hip external rotation    Knee flexion    Knee extension    Ankle dorsiflexion    Ankle plantarflexion    Ankle inversion    Ankle eversion    Lumbar flexion 80% available    Lumbar extension 15% available     (Blank rows = not tested)  LOWER EXTREMITY MMT:  MMT Right eval Left eval Right 10/09/22 Left 10/09/22  Hip flexion 5 4+    Hip extension   3+ 3/5  Hip abduction   3+ 3/5  Hip adduction      Hip internal rotation      Hip external rotation      Knee flexion      Knee extension 5 4+    Ankle dorsiflexion 5 4    Ankle plantarflexion      Ankle inversion      Ankle eversion       (Blank rows = not  tested)    FUNCTIONAL TESTS:  5 times sit to stand: 24.73 sec no UE assist  GAIT: Distance walked: 50 ft in clinic Assistive device utilized: Quad cane small base (adjusted at eval to appropriate height) Level of assistance: Modified independence Comments: slight antalgic gait   TODAY'S TREATMENT:  DATE:  10/22/22 Sit to stand x 15 Squat x 15  Marching x 5 Knee flexion 3# x 15  Step up " x 10 B  Leg press 3pl x 10 Tandem stance with head turns x 5 B    10/20/22 Nustep seat 8 level 2 dynamic warm up x 5'  10 STS controlled eccentric control with no HHA Heel raise incline slope 2x 10 Toe raises decline slope 2x 10 Squat 10x front of chair Vector stance 3x 5" with 1 HHA Sidestep with GTB around thigh- cueing to reduce Lt LE ER 2RT inside // bars minimal HHA Forward step up 4in 10x with 2 HHA- cueing to reduce hyperextension and mechanics  AROM 0-120 degrees   10/15/22 Nustep seat 8 level 2 dynamic warm up x 5'  Sitting: Hamstring stretch left 3 x 20"  Standing: Lumbar extension x 10 over bar Heel raises 2 x 10 Toe raises 2 x 10 Slant board 5 x 20" 12" step left knee drives for flexion x 2' Hip vectors 3" hold x 5 each  PATIENT EDUCATION:  Education details: Updated HEP Person educated: Patient Education method: Explanation, Demonstration, and Handouts Education comprehension: verbalized understanding, returned demonstration, verbal cues required, and tactile cues required  HOME EXERCISE PROGRAM: Access Code: [email or web address] URL: [email or web address]  10/15/22 hip vectors  10/14/22 - Seated Hamstring Stretch  - 1-2 x daily - 7 x weekly - 3 reps - 30 hold - Seated Hip Adduction Isometrics with Ball  - 1-2 x daily - 7 x weekly - 2 sets - 10 reps - 6 hold  10/09/22: - Prone Hip Extension  - 2 x daily - 7 x weekly - 1 sets - 10 reps -  5" hold - Sidelying Hip Abduction  - 2 x daily - 7 x weekly - 1 sets - 10 reps - 5" hold - Supine Bridge  - 2 x daily - 7 x weekly - 1 sets - 10 reps - 5" hold  Date: 10/07/2022 Prepared by: [facility]  Exercises - Standing Lumbar Extension with Counter  - 5 x daily - 7 x weekly - 1 sets - 5-10 reps - Prone Press Up  - 2 x daily - 7 x weekly - 1 sets - 10 reps - Standing Single Leg Stance with Counter Support  - 2 x daily - 7 x weekly - 1 sets - 3 reps - 10 sec hold - Sit to Stand  - 2 x daily - 7 x weekly - 1 sets - 10 reps  ASSESSMENT:  CLINICAL IMPRESSION: Pt late for appointment.  Added tandem stance and marching to work on balance.  Pt continues to hyperextend knees and has difficulty not doing so.  Pt tolerated well towards session with no reports of pain, was limited by fatigue with rest breaks required through session.  Eval:  Patient is a 67 y.o. female who was seen today for physical therapy evaluation and treatment for Z96.652 (ICD-10-CM) - Status post total left knee replacement M17.12 (ICD-10-CM) - Primary osteoarthritis of left knee. Patient demonstrates muscle weakness, reduced ROM, and fascial restrictions which are likely contributing to symptoms of pain and are negatively impacting patient ability to perform ADLs and functional mobility tasks. Patient will benefit from skilled physical therapy services to address these deficits to reduce pain and improve level of function with ADLs and functional mobility tasks.   OBJECTIVE IMPAIRMENTS: Abnormal gait, decreased activity tolerance, decreased balance, decreased endurance, decreased knowledge of condition, decreased mobility,  difficulty walking, decreased strength, increased fascial restrictions, and impaired perceived functional ability.   ACTIVITY LIMITATIONS: carrying, lifting, bending, sitting, standing, squatting, sleeping, and locomotion level  PARTICIPATION LIMITATIONS: meal prep, cleaning, laundry, shopping, community  activity, and yard work  [facility] POTENTIAL: Good  CLINICAL DECISION MAKING: Stable/uncomplicated  EVALUATION COMPLEXITY: Low   GOALS: Goals reviewed with patient? No  SHORT TERM GOALS: Target date: 10/21/2022 patient will be independent with initial HEP  Baseline: Goal status: IN PROGRESS  2.  Patient will self report 30% improvement to improve tolerance for functional activity  Baseline:  Goal status: IN PROGRESS  LONG TERM GOALS: Target date: 11/04/2022  Patient will be independent in self management strategies to improve quality of life and functional outcomes.  Baseline:  Goal status: IN PROGRESS  2.  Patient will self report 50% improvement to improve tolerance for functional activity  Baseline:  Goal status: IN PROGRESS  3.  Patient will increase left leg MMTs to 5/5 without pain to promote return to ambulation community distances with minimal deviation.  Baseline: see above Goal status: IN PROGRESS  4.  Patient will remain free of falls Baseline:  Goal status: IN PROGRESS  5.  Patient will improve 5 times sit to stand score from 24.73 sec to 18 sec to demonstrate improved functional mobility and increased lower extremity strength.   Baseline:  Goal status: IN PROGRESS  6.  Patient will ambulate x 224 ft without AD and minimal gait deviation to improve safety with ambulating household distances. Baseline: ambulates with QC Goal status: IN PROGRESS   PLAN:  PT FREQUENCY: 2x/week  PT DURATION: 4 weeks  PLANNED INTERVENTIONS: Therapeutic exercises, Therapeutic activity, Neuromuscular re-education, Balance training, Gait training, Patient/Family education, Joint manipulation, Joint mobilization, Stair training, Orthotic/Fit training, DME instructions, Aquatic Therapy, Dry Needling, Electrical stimulation, Spinal manipulation, Spinal mobilization, Cryotherapy, Moist heat, Compression bandaging, scar mobilization, Splintting, Taping, Traction, Ultrasound,  Ionotophoresis 4mg /ml Dexamethasone, and Manual therapy   PLAN FOR NEXT SESSION: Continue POC and may progress as tolerated with emphasis on gait; lower extremity strengthening, balance  Virgina Organ, PT CLT [phone]  10/22/2022, 10:35 AM

## 2022-10-26 ENCOUNTER — Ambulatory Visit (HOSPITAL_COMMUNITY): Payer: Federal, State, Local not specified - PPO | Admitting: Physical Therapy

## 2022-10-26 DIAGNOSIS — M25662 Stiffness of left knee, not elsewhere classified: Secondary | ICD-10-CM

## 2022-10-26 DIAGNOSIS — R262 Difficulty in walking, not elsewhere classified: Secondary | ICD-10-CM | POA: Diagnosis not present

## 2022-10-26 DIAGNOSIS — G8929 Other chronic pain: Secondary | ICD-10-CM

## 2022-10-26 NOTE — Therapy (Signed)
OUTPATIENT PHYSICAL THERAPY LOWER EXTREMITY TREATMENT   Patient Name: Pamela Stein MRN: [id number] DOB:05/02/1955, 67 y.o., female Today's Date: 10/26/2022  END OF SESSION:  PT End of Session - 10/26/22 1351     Visit Number 7    Number of Visits 8    Date for PT Re-Evaluation 11/04/22    Authorization Type [employer]    PT Start Time 1350    PT Stop Time 1436    PT Time Calculation (min) 46 min    Activity Tolerance Patient tolerated treatment well    Behavior During Therapy WFL for tasks assessed/performed             Past Medical History:  Diagnosis Date   Anxiety    Back pain    Bronchitis    Diabetes mellitus    Dizziness    Heart murmur    History of gout    Hyperlipidemia    Hypertension    Neuropathy    Obesity    Obstructive sleep apnea    CPAP   Past Surgical History:  Procedure Laterality Date   ABDOMINAL HYSTERECTOMY     [facility]   ADENOIDECTOMY     CATARACT EXTRACTION Left    right 02/2018   COLONOSCOPY  2008   diminutive rectal polyp, s/p removal. polypoid mucosa   COLONOSCOPY WITH PROPOFOL N/A 01/03/2018   Procedure: COLONOSCOPY WITH PROPOFOL;  Surgeon: Corbin Ade, MD;  Location: [facility];  Service: Endoscopy;  Laterality: N/A;  12:00pm   DILATION AND CURETTAGE OF UTERUS     ENDOMETRIAL ABLATION     EYE SURGERY Bilateral 12/04/2013   cataract   PARATHYROIDECTOMY N/A 12/22/2016   Procedure: PARATHYROIDECTOMY, NECK EXPLORATION;  Surgeon: Avel Peace, MD;  Location: [facility];  Service: General;  Laterality: N/A;   TONSILLECTOMY     TOTAL KNEE ARTHROPLASTY Left 05/28/2020   Procedure: LEFT TOTAL KNEE ARTHROPLASTY;  Surgeon: Vickki Hearing, MD;  Location: [facility];  Service: Orthopedics;  Laterality: Left;   WOUND EXPLORATION Left 05/31/2020   Procedure: WOUND EXPLORATION AND IRRIGATION LEFT KNEE;  Surgeon: Vickki Hearing, MD;  Location: [facility];  Service: Orthopedics;  Laterality: Left;  irrigation wound exploration, wound  closure   Patient Active Problem List   Diagnosis Date Noted   S/P abdominal supracervical subtotal hysterectomy 10/08/2020   Encounter for screening fecal occult blood testing 10/08/2020   Encounter for gynecological examination with Papanicolaou smear of cervix 10/08/2020   Chronic pain of left knee 05/31/2020   Wound dehiscence, surgical    Status post total left knee replacement 05/28/2020   Primary osteoarthritis of left knee    Polyneuropathy due to secondary diabetes mellitus (HCC) 05/25/2019   Arthritis of right sacroiliac joint 05/25/2019   Cervical disc disorder 05/25/2019   Falls 05/25/2019   General unsteadiness 05/25/2019   Encounter for well woman exam with routine gynecological exam 01/09/2019   Screening for colorectal cancer 01/09/2019   Encounter for screening colonoscopy 12/13/2017   Hyperparathyroidism, primary (HCC) 12/22/2016   Vitamin D deficiency 10/31/2015   Hyperuricemia 05/08/2013   ONYCHOMYCOSIS, TOENAILS 02/22/2009   NEVI, MULTIPLE 02/22/2009   Diabetes mellitus (HCC) 01/25/2008   BACK PAIN WITH RADICULOPATHY 11/09/2007   Hyperlipidemia LDL goal <100 08/26/2007   Morbid obesity (HCC) 08/26/2007   Essential hypertension 08/26/2007   Obstructive sleep apnea 04/20/2007    PCP: Avon Gully  REFERRING PROVIDER: Vickki Hearing, MD  REFERRING DIAG: [phone] (ICD-10-CM) - Status post total left knee  replacement M17.12 (ICD-10-CM) - Primary osteoarthritis of left knee  THERAPY DIAG:  Difficulty in walking, not elsewhere classified  Stiffness of left knee, not elsewhere classified  Chronic pain of left knee  Rationale for Evaluation and Treatment: Rehabilitation  ONSET DATE: 05/2020  SUBJECTIVE:   SUBJECTIVE STATEMENT: No reports of pain.  Stated compliance with HEP but she is not doing them everyday.   Eval:  History of left TKA in 2022 per Dr Romeo Apple.  Larey Seat in late July off a step; walks in today with QC; sometimes that left knee will  give way; retired; reports walks with QC at baseline; wall walks at home; reports some numbness left lateral knee to foot  PERTINENT HISTORY: S/p 05/2020 PAIN:  Are you having pain?no pain  PRECAUTIONS: Fall    WEIGHT BEARING RESTRICTIONS: No  FALLS:  Has patient fallen in last 6 months? Yes. Number of falls 4    OCCUPATION: retired  PLOF: Independent;   PATIENT GOALS: get rid of my cane; walk without my cane  NEXT MD VISIT: as needed; sees Romeo Apple yearly  OBJECTIVE:   DIAGNOSTIC FINDINGS: X-ray report left knee   Status post left knee   X-ray shows no fracture dislocation loosening or malalignment   There may be some patella Baha   Impression possible patella Baha normal appearing implant in terms of no loosening and no malalignment  PATIENT SURVEYS:  FOTO 50  COGNITION: Overall cognitive status: Within functional limits for tasks assessed     SENSATION: WFL  EDEMA:  None noted   POSTURE: rounded shoulders and forward head  PALPATION: No tenderness noted left knee  LOWER EXTREMITY ROM:  Active ROM Right eval Left eval  Hip flexion    Hip extension    Hip abduction    Hip adduction    Hip internal rotation    Hip external rotation    Knee flexion    Knee extension    Ankle dorsiflexion    Ankle plantarflexion    Ankle inversion    Ankle eversion    Lumbar flexion 80% available    Lumbar extension 15% available     (Blank rows = not tested)  LOWER EXTREMITY MMT:  MMT Right eval Left eval Right 10/09/22 Left 10/09/22  Hip flexion 5 4+    Hip extension   3+ 3/5  Hip abduction   3+ 3/5  Hip adduction      Hip internal rotation      Hip external rotation      Knee flexion      Knee extension 5 4+    Ankle dorsiflexion 5 4    Ankle plantarflexion      Ankle inversion      Ankle eversion       (Blank rows = not tested)    FUNCTIONAL TESTS:  5 times sit to stand: 24.73 sec no UE assist  GAIT: Distance walked: 50 ft in  clinic Assistive device utilized: Quad cane small base (adjusted at eval to appropriate height) Level of assistance: Modified independence Comments: slight antalgic gait   TODAY'S TREATMENT:  DATE:  10/26/22 Sit to stand 2x10 Marching 2x10 Forward step up with 1 HHA 4" 2X10 each LE Lateral step up with 1 HHA 4" 2X10 each LE Leg press 3pl 2x10  10/22/22 Sit to stand x 15 Squat x 15  Marching x 5 Knee flexion 3# x 15  Step up " x 10 B  Leg press 3pl x 10 Tandem stance with head turns x 5 B    10/20/22 Nustep seat 8 level 2 dynamic warm up x 5' 0 STS controlled eccentric control with no HHA Heel raise incline slope 2x 10 Toe raises decline slope 2x 10 Squat 10x front of chair Vector stance 3x 5" with 1 HHA Sidestep with GTB around thigh- cueing to reduce Lt LE ER 2RT inside // bars minimal HHA Forward step up 4in 10x with 2 HHA- cueing to reduce hyperextension and mechanics  AROM 0-120 degrees   10/15/22 Nustep seat 8 level 2 dynamic warm up x 5' Sitting: Hamstring stretch left 3 x 20" Standing: Lumbar extension x 10 over bar Heel raises 2 x 10 Toe raises 2 x 10 Slant board 5 x 20" 12" step left knee drives for flexion x 2' Hip vectors 3" hold x 5 each  PATIENT EDUCATION:  Education details: Updated HEP Person educated: Patient Education method: Explanation, Demonstration, and Handouts Education comprehension: verbalized understanding, returned demonstration, verbal cues required, and tactile cues required  HOME EXERCISE PROGRAM: Access Code: [email or web address] URL: [email or web address]  10/15/22 hip vectors  10/14/22 - Seated Hamstring Stretch  - 1-2 x daily - 7 x weekly - 3 reps - 30 hold - Seated Hip Adduction Isometrics with Ball  - 1-2 x daily - 7 x weekly - 2 sets - 10 reps - 6 hold  10/09/22: - Prone Hip Extension  - 2 x daily - 7  x weekly - 1 sets - 10 reps - 5" hold - Sidelying Hip Abduction  - 2 x daily - 7 x weekly - 1 sets - 10 reps - 5" hold - Supine Bridge  - 2 x daily - 7 x weekly - 1 sets - 10 reps - 5" hold  Date: 10/07/2022 Prepared by: [facility]  Exercises - Standing Lumbar Extension with Counter  - 5 x daily - 7 x weekly - 1 sets - 5-10 reps - Prone Press Up  - 2 x daily - 7 x weekly - 1 sets - 10 reps - Standing Single Leg Stance with Counter Support  - 2 x daily - 7 x weekly - 1 sets - 3 reps - 10 sec hold - Sit to Stand  - 2 x daily - 7 x weekly - 1 sets - 10 reps  ASSESSMENT:  CLINICAL IMPRESSION: Pt tearful beginning of session due to busy weekend/loss of friends.  Continued with focus on improving LE strength and stability . Pt continues to hyperextend knees and has difficulty not doing so despite cues.  Relies heavily on UE's otherwise unable to to control, especially with Lt LE.   No pain reported during or at end of session today.  Several rest breaks required during session today. PT will continue to benefit from skilled therapy.  Eval:  Patient is a 67 y.o. female who was seen today for physical therapy evaluation and treatment for Z96.652 (ICD-10-CM) - Status post total left knee replacement M17.12 (ICD-10-CM) - Primary osteoarthritis of left knee. Patient demonstrates muscle weakness, reduced ROM, and fascial restrictions which are likely contributing to symptoms of  pain and are negatively impacting patient ability to perform ADLs and functional mobility tasks. Patient will benefit from skilled physical therapy services to address these deficits to reduce pain and improve level of function with ADLs and functional mobility tasks.   OBJECTIVE IMPAIRMENTS: Abnormal gait, decreased activity tolerance, decreased balance, decreased endurance, decreased knowledge of condition, decreased mobility, difficulty walking, decreased strength, increased fascial restrictions, and impaired perceived functional  ability.   ACTIVITY LIMITATIONS: carrying, lifting, bending, sitting, standing, squatting, sleeping, and locomotion level  PARTICIPATION LIMITATIONS: meal prep, cleaning, laundry, shopping, community activity, and yard work  [facility] POTENTIAL: Good  CLINICAL DECISION MAKING: Stable/uncomplicated  EVALUATION COMPLEXITY: Low   GOALS: Goals reviewed with patient? No  SHORT TERM GOALS: Target date: 10/21/2022 patient will be independent with initial HEP  Baseline: Goal status: IN PROGRESS  2.  Patient will self report 30% improvement to improve tolerance for functional activity  Baseline:  Goal status: IN PROGRESS  LONG TERM GOALS: Target date: 11/04/2022  Patient will be independent in self management strategies to improve quality of life and functional outcomes.  Baseline:  Goal status: IN PROGRESS  2.  Patient will self report 50% improvement to improve tolerance for functional activity  Baseline:  Goal status: IN PROGRESS  3.  Patient will increase left leg MMTs to 5/5 without pain to promote return to ambulation community distances with minimal deviation.  Baseline: see above Goal status: IN PROGRESS  4.  Patient will remain free of falls Baseline:  Goal status: IN PROGRESS  5.  Patient will improve 5 times sit to stand score from 24.73 sec to 18 sec to demonstrate improved functional mobility and increased lower extremity strength.   Baseline:  Goal status: IN PROGRESS  6.  Patient will ambulate x 224 ft without AD and minimal gait deviation to improve safety with ambulating household distances. Baseline: ambulates with QC Goal status: IN PROGRESS   PLAN:  PT FREQUENCY: 2x/week  PT DURATION: 4 weeks  PLANNED INTERVENTIONS: Therapeutic exercises, Therapeutic activity, Neuromuscular re-education, Balance training, Gait training, Patient/Family education, Joint manipulation, Joint mobilization, Stair training, Orthotic/Fit training, DME instructions,  Aquatic Therapy, Dry Needling, Electrical stimulation, Spinal manipulation, Spinal mobilization, Cryotherapy, Moist heat, Compression bandaging, scar mobilization, Splintting, Taping, Traction, Ultrasound, Ionotophoresis 4mg /ml Dexamethasone, and Manual therapy   PLAN FOR NEXT SESSION: Continue POC and may progress as tolerated with emphasis on gait; lower extremity strengthening, balance  Lurena Nida, PTA/CLT [facility] Ph: [phone] 10/26/2022, 4:07 PM

## 2022-10-28 ENCOUNTER — Ambulatory Visit (HOSPITAL_COMMUNITY): Payer: Federal, State, Local not specified - PPO

## 2022-10-28 DIAGNOSIS — M25662 Stiffness of left knee, not elsewhere classified: Secondary | ICD-10-CM

## 2022-10-28 DIAGNOSIS — R2689 Other abnormalities of gait and mobility: Secondary | ICD-10-CM

## 2022-10-28 DIAGNOSIS — R29898 Other symptoms and signs involving the musculoskeletal system: Secondary | ICD-10-CM

## 2022-10-28 DIAGNOSIS — G8929 Other chronic pain: Secondary | ICD-10-CM

## 2022-10-28 DIAGNOSIS — R262 Difficulty in walking, not elsewhere classified: Secondary | ICD-10-CM | POA: Diagnosis not present

## 2022-10-28 NOTE — Therapy (Signed)
OUTPATIENT PHYSICAL THERAPY LOWER EXTREMITY TREATMENT/PROGRESS NOTE Progress Note Reporting Period 10/07/2022 to 10/28/2022  See note below for Objective Data and Assessment of Progress/Goals.       Patient Name: Pamela Stein MRN: [id number] DOB:14-Apr-1955, 67 y.o., female Today's Date: 10/28/2022  END OF SESSION:  PT End of Session - 10/28/22 1306     Visit Number 8    Number of Visits 16    Date for PT Re-Evaluation 11/25/22    Authorization Type [employer]    PT Start Time 1304    PT Stop Time 1344    PT Time Calculation (min) 40 min    Activity Tolerance Patient tolerated treatment well    Behavior During Therapy WFL for tasks assessed/performed             Past Medical History:  Diagnosis Date   Anxiety    Back pain    Bronchitis    Diabetes mellitus    Dizziness    Heart murmur    History of gout    Hyperlipidemia    Hypertension    Neuropathy    Obesity    Obstructive sleep apnea    CPAP   Past Surgical History:  Procedure Laterality Date   ABDOMINAL HYSTERECTOMY     [facility]   ADENOIDECTOMY     CATARACT EXTRACTION Left    right 02/2018   COLONOSCOPY  2008   diminutive rectal polyp, s/p removal. polypoid mucosa   COLONOSCOPY WITH PROPOFOL N/A 01/03/2018   Procedure: COLONOSCOPY WITH PROPOFOL;  Surgeon: Corbin Ade, MD;  Location: [facility];  Service: Endoscopy;  Laterality: N/A;  12:00pm   DILATION AND CURETTAGE OF UTERUS     ENDOMETRIAL ABLATION     EYE SURGERY Bilateral 12/04/2013   cataract   PARATHYROIDECTOMY N/A 12/22/2016   Procedure: PARATHYROIDECTOMY, NECK EXPLORATION;  Surgeon: Avel Peace, MD;  Location: [facility];  Service: General;  Laterality: N/A;   TONSILLECTOMY     TOTAL KNEE ARTHROPLASTY Left 05/28/2020   Procedure: LEFT TOTAL KNEE ARTHROPLASTY;  Surgeon: Vickki Hearing, MD;  Location: [facility];  Service: Orthopedics;  Laterality: Left;   WOUND EXPLORATION Left 05/31/2020   Procedure: WOUND EXPLORATION AND IRRIGATION  LEFT KNEE;  Surgeon: Vickki Hearing, MD;  Location: [facility];  Service: Orthopedics;  Laterality: Left;  irrigation wound exploration, wound closure   Patient Active Problem List   Diagnosis Date Noted   S/P abdominal supracervical subtotal hysterectomy 10/08/2020   Encounter for screening fecal occult blood testing 10/08/2020   Encounter for gynecological examination with Papanicolaou smear of cervix 10/08/2020   Chronic pain of left knee 05/31/2020   Wound dehiscence, surgical    Status post total left knee replacement 05/28/2020   Primary osteoarthritis of left knee    Polyneuropathy due to secondary diabetes mellitus (HCC) 05/25/2019   Arthritis of right sacroiliac joint 05/25/2019   Cervical disc disorder 05/25/2019   Falls 05/25/2019   General unsteadiness 05/25/2019   Encounter for well woman exam with routine gynecological exam 01/09/2019   Screening for colorectal cancer 01/09/2019   Encounter for screening colonoscopy 12/13/2017   Hyperparathyroidism, primary (HCC) 12/22/2016   Vitamin D deficiency 10/31/2015   Hyperuricemia 05/08/2013   ONYCHOMYCOSIS, TOENAILS 02/22/2009   NEVI, MULTIPLE 02/22/2009   Diabetes mellitus (HCC) 01/25/2008   BACK PAIN WITH RADICULOPATHY 11/09/2007   Hyperlipidemia LDL goal <100 08/26/2007   Morbid obesity (HCC) 08/26/2007   Essential hypertension 08/26/2007   Obstructive sleep apnea 04/20/2007  PCP: Avon Gully  REFERRING PROVIDER: Vickki Hearing, MD  REFERRING DIAG: [phone] (ICD-10-CM) - Status post total left knee replacement M17.12 (ICD-10-CM) - Primary osteoarthritis of left knee  THERAPY DIAG:  Difficulty in walking, not elsewhere classified - Plan: PT plan of care cert/re-cert  Stiffness of left knee, not elsewhere classified - Plan: PT plan of care cert/re-cert  Chronic pain of left knee - Plan: PT plan of care cert/re-cert  Other abnormalities of gait and mobility - Plan: PT plan of care cert/re-cert  Weakness  of left leg - Plan: PT plan of care cert/re-cert  Rationale for Evaluation and Treatment: Rehabilitation  ONSET DATE: 05/2020  SUBJECTIVE:   SUBJECTIVE STATEMENT: No reports of pain.  Continued leg weakness; no falls; feels "about the same" since starting therapy   Eval:  History of left TKA in 2022 per Dr Romeo Apple.  Larey Seat in late July off a step; walks in today with QC; sometimes that left knee will give way; retired; reports walks with QC at baseline; wall walks at home; reports some numbness left lateral knee to foot  PERTINENT HISTORY: S/p 05/2020 PAIN:  Are you having pain?no pain  PRECAUTIONS: Fall    WEIGHT BEARING RESTRICTIONS: No  FALLS:  Has patient fallen in last 6 months? Yes. Number of falls 4    OCCUPATION: retired  PLOF: Independent;   PATIENT GOALS: get rid of my cane; walk without my cane  NEXT MD VISIT: as needed; sees Romeo Apple yearly  OBJECTIVE:   DIAGNOSTIC FINDINGS: X-ray report left knee   Status post left knee   X-ray shows no fracture dislocation loosening or malalignment   There may be some patella Baha   Impression possible patella Baha normal appearing implant in terms of no loosening and no malalignment  PATIENT SURVEYS:  FOTO 50  COGNITION: Overall cognitive status: Within functional limits for tasks assessed     SENSATION: WFL  EDEMA:  None noted   POSTURE: rounded shoulders and forward head  PALPATION: No tenderness noted left knee  LOWER EXTREMITY ROM:  Active ROM Right eval Left eval  Hip flexion    Hip extension    Hip abduction    Hip adduction    Hip internal rotation    Hip external rotation    Knee flexion    Knee extension    Ankle dorsiflexion    Ankle plantarflexion    Ankle inversion    Ankle eversion    Lumbar flexion 80% available    Lumbar extension 15% available     (Blank rows = not tested)  LOWER EXTREMITY MMT:  MMT Right eval Left eval Right 10/09/22 Left 10/09/22 Right 10/28/22  Left 10/28/22  Hip flexion 5 4+   5 4+  Hip extension   3+ 3/5 5 5   Hip abduction   3+ 3/5 4+ 4+  Hip adduction        Hip internal rotation        Hip external rotation        Knee flexion        Knee extension 5 4+   5 5  Ankle dorsiflexion 5 4   5  4+  Ankle plantarflexion        Ankle inversion        Ankle eversion         (Blank rows = not tested)    FUNCTIONAL TESTS:  5 times sit to stand: 24.73 sec no UE assist  GAIT: Distance walked: 50  ft in clinic Assistive device utilized: Quad cane small base (adjusted at eval to appropriate height) Level of assistance: Modified independence Comments: slight antalgic gait   TODAY'S TREATMENT:                                                                                                                              DATE:  10/28/22 Progress note Nustep seat 8 x 5' dynamic warm up FOTO 58 MMT's see above 5 x sit to stand 20 sec no UE assist SLS right 5"; left 2"  10/26/22 Sit to stand 2x10 Marching 2x10 Forward step up with 1 HHA 4" 2X10 each LE Lateral step up with 1 HHA 4" 2X10 each LE Leg press 3pl 2x10  10/22/22 Sit to stand x 15 Squat x 15  Marching x 5 Knee flexion 3# x 15  Step up " x 10 B  Leg press 3pl x 10 Tandem stance with head turns x 5 B    10/20/22 Nustep seat 8 level 2 dynamic warm up x 5' 0 STS controlled eccentric control with no HHA Heel raise incline slope 2x 10 Toe raises decline slope 2x 10 Squat 10x front of chair Vector stance 3x 5" with 1 HHA Sidestep with GTB around thigh- cueing to reduce Lt LE ER 2RT inside // bars minimal HHA Forward step up 4in 10x with 2 HHA- cueing to reduce hyperextension and mechanics  AROM 0-120 degrees   10/15/22 Nustep seat 8 level 2 dynamic warm up x 5' Sitting: Hamstring stretch left 3 x 20" Standing: Lumbar extension x 10 over bar Heel raises 2 x 10 Toe raises 2 x 10 Slant board 5 x 20" 12" step left knee drives for flexion x 2' Hip vectors 3" hold  x 5 each  PATIENT EDUCATION:  Education details: Updated HEP Person educated: Patient Education method: Explanation, Demonstration, and Handouts Education comprehension: verbalized understanding, returned demonstration, verbal cues required, and tactile cues required  HOME EXERCISE PROGRAM: Access Code: [email or web address] URL: [email or web address]  10/28/22 SLS  10/15/22 hip vectors  10/14/22 - Seated Hamstring Stretch  - 1-2 x daily - 7 x weekly - 3 reps - 30 hold - Seated Hip Adduction Isometrics with Ball  - 1-2 x daily - 7 x weekly - 2 sets - 10 reps - 6 hold  10/09/22: - Prone Hip Extension  - 2 x daily - 7 x weekly - 1 sets - 10 reps - 5" hold - Sidelying Hip Abduction  - 2 x daily - 7 x weekly - 1 sets - 10 reps - 5" hold - Supine Bridge  - 2 x daily - 7 x weekly - 1 sets - 10 reps - 5" hold  Date: 10/07/2022 Prepared by: [facility]  Exercises - Standing Lumbar Extension with Counter  - 5 x daily - 7 x weekly - 1 sets - 5-10 reps - Prone Press Up  - 2 x daily - 7  x weekly - 1 sets - 10 reps - Standing Single Leg Stance with Counter Support  - 2 x daily - 7 x weekly - 1 sets - 3 reps - 10 sec hold - Sit to Stand  - 2 x daily - 7 x weekly - 1 sets - 10 reps  ASSESSMENT:  CLINICAL IMPRESSION: Progress note today.good progress with strength noted; good progress with sit to stand test.  Discussed AFO but patient has active dorsiflexion so will hold off for now; she does have some uncontrolled hyperextension of left knee during stance phase of gait.  Patient will benefit from continued skilled therapy services to address partially met and unmet goals.    Eval:  Patient is a 67 y.o. female who was seen today for physical therapy evaluation and treatment for Z96.652 (ICD-10-CM) - Status post total left knee replacement M17.12 (ICD-10-CM) - Primary osteoarthritis of left knee. Patient demonstrates muscle weakness, reduced ROM, and fascial restrictions which are likely contributing  to symptoms of pain and are negatively impacting patient ability to perform ADLs and functional mobility tasks. Patient will benefit from skilled physical therapy services to address these deficits to reduce pain and improve level of function with ADLs and functional mobility tasks.   OBJECTIVE IMPAIRMENTS: Abnormal gait, decreased activity tolerance, decreased balance, decreased endurance, decreased knowledge of condition, decreased mobility, difficulty walking, decreased strength, increased fascial restrictions, and impaired perceived functional ability.   ACTIVITY LIMITATIONS: carrying, lifting, bending, sitting, standing, squatting, sleeping, and locomotion level  PARTICIPATION LIMITATIONS: meal prep, cleaning, laundry, shopping, community activity, and yard work  [facility] POTENTIAL: Good  CLINICAL DECISION MAKING: Stable/uncomplicated  EVALUATION COMPLEXITY: Low   GOALS: Goals reviewed with patient? No  SHORT TERM GOALS: Target date: 10/21/2022 patient will be independent with initial HEP  Baseline: Goal status: met  2.  Patient will self report 30% improvement to improve tolerance for functional activity  Baseline:  Goal status: IN PROGRESS  LONG TERM GOALS: Target date: 11/04/2022  Patient will be independent in self management strategies to improve quality of life and functional outcomes.  Baseline:  Goal status: IN PROGRESS  2.  Patient will self report 50% improvement to improve tolerance for functional activity  Baseline:  Goal status: IN PROGRESS  3.  Patient will increase left leg MMTs to 5/5 without pain to promote return to ambulation community distances with minimal deviation.  Baseline: see above Goal status: IN PROGRESS  4.  Patient will remain free of falls Baseline: 10/28/22 no new falls Goal status: IN PROGRESS  5.  Patient will improve 5 times sit to stand score from 24.73 sec to 18 sec to demonstrate improved functional mobility and increased  lower extremity strength.   Baseline: 10/28/22 20 sec Goal status: IN PROGRESS  6.  Patient will ambulate x 224 ft without AD and minimal gait deviation to improve safety with ambulating household distances. Baseline: ambulates with QC Goal status: IN PROGRESS  7. Patient will be able to stand SLS x 10" each to demonstrate improved functional balance  Baseline: 5" on right and 2" on left  Goal status: in progress   PLAN:  PT FREQUENCY: 2x/week  PT DURATION: 4 weeks  PLANNED INTERVENTIONS: Therapeutic exercises, Therapeutic activity, Neuromuscular re-education, Balance training, Gait training, Patient/Family education, Joint manipulation, Joint mobilization, Stair training, Orthotic/Fit training, DME instructions, Aquatic Therapy, Dry Needling, Electrical stimulation, Spinal manipulation, Spinal mobilization, Cryotherapy, Moist heat, Compression bandaging, scar mobilization, Splintting, Taping, Traction, Ultrasound, Ionotophoresis 4mg /ml Dexamethasone, and Manual therapy  PLAN FOR NEXT SESSION: Continue POC and may progress as tolerated with emphasis on gait; lower extremity strengthening, balance  1:44 PM, 10/28/22 Rossanna Spitzley Small Geordan Xu MPT [facility] [place] [phone] [phone]

## 2022-11-02 ENCOUNTER — Ambulatory Visit (HOSPITAL_COMMUNITY): Payer: Federal, State, Local not specified - PPO

## 2022-11-02 DIAGNOSIS — R29898 Other symptoms and signs involving the musculoskeletal system: Secondary | ICD-10-CM

## 2022-11-02 DIAGNOSIS — R262 Difficulty in walking, not elsewhere classified: Secondary | ICD-10-CM

## 2022-11-02 DIAGNOSIS — M25662 Stiffness of left knee, not elsewhere classified: Secondary | ICD-10-CM

## 2022-11-02 DIAGNOSIS — G8929 Other chronic pain: Secondary | ICD-10-CM

## 2022-11-02 DIAGNOSIS — M6281 Muscle weakness (generalized): Secondary | ICD-10-CM

## 2022-11-02 DIAGNOSIS — R2689 Other abnormalities of gait and mobility: Secondary | ICD-10-CM

## 2022-11-02 NOTE — Therapy (Signed)
OUTPATIENT PHYSICAL THERAPY LOWER EXTREMITY TREATMENT     Patient Name: Pamela Stein MRN: [id number] DOB:May 19, 1955, 67 y.o., female Today's Date: 11/02/2022  END OF SESSION:  PT End of Session - 11/02/22 1101     Visit Number 9    Number of Visits 16    Date for PT Re-Evaluation 11/25/22    Authorization Type [employer]    PT Start Time 1102    PT Stop Time 1142    PT Time Calculation (min) 40 min    Activity Tolerance Patient tolerated treatment well    Behavior During Therapy WFL for tasks assessed/performed             Past Medical History:  Diagnosis Date   Anxiety    Back pain    Bronchitis    Diabetes mellitus    Dizziness    Heart murmur    History of gout    Hyperlipidemia    Hypertension    Neuropathy    Obesity    Obstructive sleep apnea    CPAP   Past Surgical History:  Procedure Laterality Date   ABDOMINAL HYSTERECTOMY     [facility]   ADENOIDECTOMY     CATARACT EXTRACTION Left    right 02/2018   COLONOSCOPY  2008   diminutive rectal polyp, s/p removal. polypoid mucosa   COLONOSCOPY WITH PROPOFOL N/A 01/03/2018   Procedure: COLONOSCOPY WITH PROPOFOL;  Surgeon: Corbin Ade, MD;  Location: [facility];  Service: Endoscopy;  Laterality: N/A;  12:00pm   DILATION AND CURETTAGE OF UTERUS     ENDOMETRIAL ABLATION     EYE SURGERY Bilateral 12/04/2013   cataract   PARATHYROIDECTOMY N/A 12/22/2016   Procedure: PARATHYROIDECTOMY, NECK EXPLORATION;  Surgeon: Avel Peace, MD;  Location: [facility];  Service: General;  Laterality: N/A;   TONSILLECTOMY     TOTAL KNEE ARTHROPLASTY Left 05/28/2020   Procedure: LEFT TOTAL KNEE ARTHROPLASTY;  Surgeon: Vickki Hearing, MD;  Location: [facility];  Service: Orthopedics;  Laterality: Left;   WOUND EXPLORATION Left 05/31/2020   Procedure: WOUND EXPLORATION AND IRRIGATION LEFT KNEE;  Surgeon: Vickki Hearing, MD;  Location: [facility];  Service: Orthopedics;  Laterality: Left;  irrigation wound exploration, wound  closure   Patient Active Problem List   Diagnosis Date Noted   S/P abdominal supracervical subtotal hysterectomy 10/08/2020   Encounter for screening fecal occult blood testing 10/08/2020   Encounter for gynecological examination with Papanicolaou smear of cervix 10/08/2020   Chronic pain of left knee 05/31/2020   Wound dehiscence, surgical    Status post total left knee replacement 05/28/2020   Primary osteoarthritis of left knee    Polyneuropathy due to secondary diabetes mellitus (HCC) 05/25/2019   Arthritis of right sacroiliac joint 05/25/2019   Cervical disc disorder 05/25/2019   Falls 05/25/2019   General unsteadiness 05/25/2019   Encounter for well woman exam with routine gynecological exam 01/09/2019   Screening for colorectal cancer 01/09/2019   Encounter for screening colonoscopy 12/13/2017   Hyperparathyroidism, primary (HCC) 12/22/2016   Vitamin D deficiency 10/31/2015   Hyperuricemia 05/08/2013   ONYCHOMYCOSIS, TOENAILS 02/22/2009   NEVI, MULTIPLE 02/22/2009   Diabetes mellitus (HCC) 01/25/2008   BACK PAIN WITH RADICULOPATHY 11/09/2007   Hyperlipidemia LDL goal <100 08/26/2007   Morbid obesity (HCC) 08/26/2007   Essential hypertension 08/26/2007   Obstructive sleep apnea 04/20/2007    PCP: Avon Gully  REFERRING PROVIDER: Vickki Hearing, MD  REFERRING DIAG: [phone] (ICD-10-CM) - Status post total  left knee replacement M17.12 (ICD-10-CM) - Primary osteoarthritis of left knee  THERAPY DIAG:  Difficulty in walking, not elsewhere classified  Stiffness of left knee, not elsewhere classified  Chronic pain of left knee  Other abnormalities of gait and mobility  Weakness of left leg  Muscle weakness (generalized)  Rationale for Evaluation and Treatment: Rehabilitation  ONSET DATE: 05/2020  SUBJECTIVE:   SUBJECTIVE STATEMENT: Patient reports she had a fall yesterday; she tripped and fell on her carport; she was able to get up on her own and states  she is sore on right hip and right elbow but otherwise ok.  4-5/10 soreness right hip  Eval:  History of left TKA in 2022 per Dr Romeo Apple.  Larey Seat in late July off a step; walks in today with QC; sometimes that left knee will give way; retired; reports walks with QC at baseline; wall walks at home; reports some numbness left lateral knee to foot  PERTINENT HISTORY: S/p 05/2020 PAIN:  Are you having pain?no pain  PRECAUTIONS: Fall    WEIGHT BEARING RESTRICTIONS: No  FALLS:  Has patient fallen in last 6 months? Yes. Number of falls 4    OCCUPATION: retired  PLOF: Independent;   PATIENT GOALS: get rid of my cane; walk without my cane  NEXT MD VISIT: as needed; sees Romeo Apple yearly  OBJECTIVE:   DIAGNOSTIC FINDINGS: X-ray report left knee   Status post left knee   X-ray shows no fracture dislocation loosening or malalignment   There may be some patella Baha   Impression possible patella Baha normal appearing implant in terms of no loosening and no malalignment  PATIENT SURVEYS:  FOTO 50  COGNITION: Overall cognitive status: Within functional limits for tasks assessed     SENSATION: WFL  EDEMA:  None noted   POSTURE: rounded shoulders and forward head  PALPATION: No tenderness noted left knee  LOWER EXTREMITY ROM:  Active ROM Right eval Left eval  Hip flexion    Hip extension    Hip abduction    Hip adduction    Hip internal rotation    Hip external rotation    Knee flexion    Knee extension    Ankle dorsiflexion    Ankle plantarflexion    Ankle inversion    Ankle eversion    Lumbar flexion 80% available    Lumbar extension 15% available     (Blank rows = not tested)  LOWER EXTREMITY MMT:  MMT Right eval Left eval Right 10/09/22 Left 10/09/22 Right 10/28/22 Left 10/28/22  Hip flexion 5 4+   5 4+  Hip extension   3+ 3/5 5 5   Hip abduction   3+ 3/5 4+ 4+  Hip adduction        Hip internal rotation        Hip external rotation        Knee  flexion        Knee extension 5 4+   5 5  Ankle dorsiflexion 5 4   5  4+  Ankle plantarflexion        Ankle inversion        Ankle eversion         (Blank rows = not tested)    FUNCTIONAL TESTS:  5 times sit to stand: 24.73 sec no UE assist  GAIT: Distance walked: 50 ft in clinic Assistive device utilized: Quad cane small base (adjusted at eval to appropriate height) Level of assistance: Modified independence Comments: slight antalgic gait  TODAY'S TREATMENT:                                                                                                                              DATE:  11/02/22 Nustep seat 8 x 5' dynamic warm up Instructions on ordering AFO (ok per message from Dr. Romeo Apple) Standing: Heel raises x 20 Hip vectors 2" hold x 5 each with 1 UE assist Sit to stand 2 x 10 4" Step ups with 1 HHA  x 10 each 4" lateral step ups with 2 HHA x 10 each   10/28/22 Progress note Nustep seat 8 x 5' dynamic warm up FOTO 58 MMT's see above 5 x sit to stand 20 sec no UE assist SLS right 5"; left 2"  10/26/22 Sit to stand 2x10 Marching 2x10 Forward step up with 1 HHA 4" 2X10 each LE Lateral step up with 1 HHA 4" 2X10 each LE Leg press 3pl 2x10  10/22/22 Sit to stand x 15 Squat x 15  Marching x 5 Knee flexion 3# x 15  Step up " x 10 B  Leg press 3pl x 10 Tandem stance with head turns x 5 B    10/20/22 Nustep seat 8 level 2 dynamic warm up x 5' 0 STS controlled eccentric control with no HHA Heel raise incline slope 2x 10 Toe raises decline slope 2x 10 Squat 10x front of chair Vector stance 3x 5" with 1 HHA Sidestep with GTB around thigh- cueing to reduce Lt LE ER 2RT inside // bars minimal HHA Forward step up 4in 10x with 2 HHA- cueing to reduce hyperextension and mechanics  AROM 0-120 degrees   10/15/22 Nustep seat 8 level 2 dynamic warm up x 5' Sitting: Hamstring stretch left 3 x 20" Standing: Lumbar extension x 10 over bar Heel raises 2 x 10 Toe  raises 2 x 10 Slant board 5 x 20" 12" step left knee drives for flexion x 2' Hip vectors 3" hold x 5 each  PATIENT EDUCATION:  Education details: Updated HEP Person educated: Patient Education method: Explanation, Demonstration, and Handouts Education comprehension: verbalized understanding, returned demonstration, verbal cues required, and tactile cues required  HOME EXERCISE PROGRAM: Access Code: [email or web address] URL: [email or web address]  10/28/22 SLS  10/15/22 hip vectors  10/14/22 - Seated Hamstring Stretch  - 1-2 x daily - 7 x weekly - 3 reps - 30 hold - Seated Hip Adduction Isometrics with Ball  - 1-2 x daily - 7 x weekly - 2 sets - 10 reps - 6 hold  10/09/22: - Prone Hip Extension  - 2 x daily - 7 x weekly - 1 sets - 10 reps - 5" hold - Sidelying Hip Abduction  - 2 x daily - 7 x weekly - 1 sets - 10 reps - 5" hold - Supine Bridge  - 2 x daily - 7 x weekly - 1 sets - 10 reps - 5" hold  Date: 10/07/2022 Prepared by: [facility] -  Rehab  Exercises - Standing Lumbar Extension with Counter  - 5 x daily - 7 x weekly - 1 sets - 5-10 reps - Prone Press Up  - 2 x daily - 7 x weekly - 1 sets - 10 reps - Standing Single Leg Stance with Counter Support  - 2 x daily - 7 x weekly - 1 sets - 3 reps - 10 sec hold - Sit to Stand  - 2 x daily - 7 x weekly - 1 sets - 10 reps  ASSESSMENT:  CLINICAL IMPRESSION: Since patient had a fall over the weekend; sent a message to Dr. Romeo Apple asking if he ok with patient purchasing an AFO and he is agreeable.  She has active dorsiflexion left foot but decreased control left foot and knee.  Printed out a picture of appropriate AFO for patient to order.  Continues with left knee "snapping back" into hyperextension with step ups; gait; sit to stand.    Patient will benefit from continued skilled therapy services to address partially met and unmet goals; address deficits and promote optimal function.     Eval:  Patient is a 67 y.o. female who was seen today  for physical therapy evaluation and treatment for Z96.652 (ICD-10-CM) - Status post total left knee replacement M17.12 (ICD-10-CM) - Primary osteoarthritis of left knee. Patient demonstrates muscle weakness, reduced ROM, and fascial restrictions which are likely contributing to symptoms of pain and are negatively impacting patient ability to perform ADLs and functional mobility tasks. Patient will benefit from skilled physical therapy services to address these deficits to reduce pain and improve level of function with ADLs and functional mobility tasks.   OBJECTIVE IMPAIRMENTS: Abnormal gait, decreased activity tolerance, decreased balance, decreased endurance, decreased knowledge of condition, decreased mobility, difficulty walking, decreased strength, increased fascial restrictions, and impaired perceived functional ability.   ACTIVITY LIMITATIONS: carrying, lifting, bending, sitting, standing, squatting, sleeping, and locomotion level  PARTICIPATION LIMITATIONS: meal prep, cleaning, laundry, shopping, community activity, and yard work  [facility] POTENTIAL: Good  CLINICAL DECISION MAKING: Stable/uncomplicated  EVALUATION COMPLEXITY: Low   GOALS: Goals reviewed with patient? No  SHORT TERM GOALS: Target date: 10/21/2022 patient will be independent with initial HEP  Baseline: Goal status: met  2.  Patient will self report 30% improvement to improve tolerance for functional activity  Baseline:  Goal status: IN PROGRESS  LONG TERM GOALS: Target date: 11/04/2022  Patient will be independent in self management strategies to improve quality of life and functional outcomes.  Baseline:  Goal status: IN PROGRESS  2.  Patient will self report 50% improvement to improve tolerance for functional activity  Baseline:  Goal status: IN PROGRESS  3.  Patient will increase left leg MMTs to 5/5 without pain to promote return to ambulation community distances with minimal deviation.  Baseline:  see above Goal status: IN PROGRESS  4.  Patient will remain free of falls Baseline: 10/28/22 no new falls Goal status: IN PROGRESS  5.  Patient will improve 5 times sit to stand score from 24.73 sec to 18 sec to demonstrate improved functional mobility and increased lower extremity strength.   Baseline: 10/28/22 20 sec Goal status: IN PROGRESS  6.  Patient will ambulate x 224 ft without AD and minimal gait deviation to improve safety with ambulating household distances. Baseline: ambulates with QC Goal status: IN PROGRESS  7. Patient will be able to stand SLS x 10" each to demonstrate improved functional balance  Baseline: 5" on right and 2" on  left  Goal status: in progress   PLAN:  PT FREQUENCY: 2x/week  PT DURATION: 4 weeks  PLANNED INTERVENTIONS: Therapeutic exercises, Therapeutic activity, Neuromuscular re-education, Balance training, Gait training, Patient/Family education, Joint manipulation, Joint mobilization, Stair training, Orthotic/Fit training, DME instructions, Aquatic Therapy, Dry Needling, Electrical stimulation, Spinal manipulation, Spinal mobilization, Cryotherapy, Moist heat, Compression bandaging, scar mobilization, Splintting, Taping, Traction, Ultrasound, Ionotophoresis 4mg /ml Dexamethasone, and Manual therapy   PLAN FOR NEXT SESSION: Continue POC and may progress as tolerated with emphasis on gait; lower extremity strengthening, balance; follow up with patient ordering AFO for left foot  11:20 AM, 11/02/22 Dominie Benedick Small Zoua Caporaso MPT [facility] [place] [phone] [phone]

## 2022-11-04 ENCOUNTER — Encounter (HOSPITAL_COMMUNITY): Payer: Federal, State, Local not specified - PPO

## 2022-11-10 ENCOUNTER — Ambulatory Visit (HOSPITAL_COMMUNITY): Payer: Federal, State, Local not specified - PPO | Attending: Orthopedic Surgery

## 2022-11-10 DIAGNOSIS — G8929 Other chronic pain: Secondary | ICD-10-CM | POA: Insufficient documentation

## 2022-11-10 DIAGNOSIS — R29898 Other symptoms and signs involving the musculoskeletal system: Secondary | ICD-10-CM | POA: Insufficient documentation

## 2022-11-10 DIAGNOSIS — M6281 Muscle weakness (generalized): Secondary | ICD-10-CM | POA: Insufficient documentation

## 2022-11-10 DIAGNOSIS — M25662 Stiffness of left knee, not elsewhere classified: Secondary | ICD-10-CM | POA: Diagnosis present

## 2022-11-10 DIAGNOSIS — M25562 Pain in left knee: Secondary | ICD-10-CM | POA: Diagnosis present

## 2022-11-10 DIAGNOSIS — R262 Difficulty in walking, not elsewhere classified: Secondary | ICD-10-CM | POA: Diagnosis present

## 2022-11-10 DIAGNOSIS — R2689 Other abnormalities of gait and mobility: Secondary | ICD-10-CM | POA: Diagnosis present

## 2022-11-10 NOTE — Therapy (Signed)
OUTPATIENT PHYSICAL THERAPY LOWER EXTREMITY TREATMENT     Patient Name: Pamela Stein MRN: [id number] DOB:01-Sep-1955, 67 y.o., female Today's Date: 11/10/2022  END OF SESSION:  PT End of Session - 11/10/22 1314     Visit Number 10    Number of Visits 16    Date for PT Re-Evaluation 11/25/22    Authorization Type [employer]    PT Start Time 0110   late check in   PT Stop Time 0145    PT Time Calculation (min) 35 min    Activity Tolerance Patient tolerated treatment well    Behavior During Therapy WFL for tasks assessed/performed             Past Medical History:  Diagnosis Date   Anxiety    Back pain    Bronchitis    Diabetes mellitus    Dizziness    Heart murmur    History of gout    Hyperlipidemia    Hypertension    Neuropathy    Obesity    Obstructive sleep apnea    CPAP   Past Surgical History:  Procedure Laterality Date   ABDOMINAL HYSTERECTOMY     [facility]   ADENOIDECTOMY     CATARACT EXTRACTION Left    right 02/2018   COLONOSCOPY  2008   diminutive rectal polyp, s/p removal. polypoid mucosa   COLONOSCOPY WITH PROPOFOL N/A 01/03/2018   Procedure: COLONOSCOPY WITH PROPOFOL;  Surgeon: Corbin Ade, MD;  Location: [facility];  Service: Endoscopy;  Laterality: N/A;  12:00pm   DILATION AND CURETTAGE OF UTERUS     ENDOMETRIAL ABLATION     EYE SURGERY Bilateral 12/04/2013   cataract   PARATHYROIDECTOMY N/A 12/22/2016   Procedure: PARATHYROIDECTOMY, NECK EXPLORATION;  Surgeon: Avel Peace, MD;  Location: [facility];  Service: General;  Laterality: N/A;   TONSILLECTOMY     TOTAL KNEE ARTHROPLASTY Left 05/28/2020   Procedure: LEFT TOTAL KNEE ARTHROPLASTY;  Surgeon: Vickki Hearing, MD;  Location: [facility];  Service: Orthopedics;  Laterality: Left;   WOUND EXPLORATION Left 05/31/2020   Procedure: WOUND EXPLORATION AND IRRIGATION LEFT KNEE;  Surgeon: Vickki Hearing, MD;  Location: [facility];  Service: Orthopedics;  Laterality: Left;  irrigation wound  exploration, wound closure   Patient Active Problem List   Diagnosis Date Noted   S/P abdominal supracervical subtotal hysterectomy 10/08/2020   Encounter for screening fecal occult blood testing 10/08/2020   Encounter for gynecological examination with Papanicolaou smear of cervix 10/08/2020   Chronic pain of left knee 05/31/2020   Wound dehiscence, surgical    Status post total left knee replacement 05/28/2020   Primary osteoarthritis of left knee    Polyneuropathy due to secondary diabetes mellitus (HCC) 05/25/2019   Arthritis of right sacroiliac joint (HCC) 05/25/2019   Cervical disc disorder 05/25/2019   Falls 05/25/2019   General unsteadiness 05/25/2019   Encounter for well woman exam with routine gynecological exam 01/09/2019   Screening for colorectal cancer 01/09/2019   Encounter for screening colonoscopy 12/13/2017   Hyperparathyroidism, primary (HCC) 12/22/2016   Vitamin D deficiency 10/31/2015   Hyperuricemia 05/08/2013   ONYCHOMYCOSIS, TOENAILS 02/22/2009   NEVI, MULTIPLE 02/22/2009   Diabetes mellitus (HCC) 01/25/2008   BACK PAIN WITH RADICULOPATHY 11/09/2007   Hyperlipidemia LDL goal <100 08/26/2007   Morbid obesity (HCC) 08/26/2007   Essential hypertension 08/26/2007   Obstructive sleep apnea 04/20/2007    PCP: Avon Gully  REFERRING PROVIDER: Vickki Hearing, MD  REFERRING DIAG: [phone] (  ICD-10-CM) - Status post total left knee replacement M17.12 (ICD-10-CM) - Primary osteoarthritis of left knee  THERAPY DIAG:  Difficulty in walking, not elsewhere classified  Stiffness of left knee, not elsewhere classified  Chronic pain of left knee  Other abnormalities of gait and mobility  Weakness of left leg  Muscle weakness (generalized)  Rationale for Evaluation and Treatment: Rehabilitation  ONSET DATE: 05/2020  SUBJECTIVE:   SUBJECTIVE STATEMENT: Still sore from her fall; right hip soreness 8/10; has not yet purchased an AFO; was sick last  week  Eval:  History of left TKA in 2022 per Dr Romeo Apple.  Larey Seat in late July off a step; walks in today with QC; sometimes that left knee will give way; retired; reports walks with QC at baseline; wall walks at home; reports some numbness left lateral knee to foot  PERTINENT HISTORY: S/p 05/2020 PAIN:  Are you having pain?no pain  PRECAUTIONS: Fall    WEIGHT BEARING RESTRICTIONS: No  FALLS:  Has patient fallen in last 6 months? Yes. Number of falls 4    OCCUPATION: retired  PLOF: Independent;   PATIENT GOALS: get rid of my cane; walk without my cane  NEXT MD VISIT: as needed; sees Romeo Apple yearly  OBJECTIVE:   DIAGNOSTIC FINDINGS: X-ray report left knee   Status post left knee   X-ray shows no fracture dislocation loosening or malalignment   There may be some patella Baha   Impression possible patella Baha normal appearing implant in terms of no loosening and no malalignment  PATIENT SURVEYS:  FOTO 50  COGNITION: Overall cognitive status: Within functional limits for tasks assessed     SENSATION: WFL  EDEMA:  None noted   POSTURE: rounded shoulders and forward head  PALPATION: No tenderness noted left knee  LOWER EXTREMITY ROM:  Active ROM Right eval Left eval  Hip flexion    Hip extension    Hip abduction    Hip adduction    Hip internal rotation    Hip external rotation    Knee flexion    Knee extension    Ankle dorsiflexion    Ankle plantarflexion    Ankle inversion    Ankle eversion    Lumbar flexion 80% available    Lumbar extension 15% available     (Blank rows = not tested)  LOWER EXTREMITY MMT:  MMT Right eval Left eval Right 10/09/22 Left 10/09/22 Right 10/28/22 Left 10/28/22  Hip flexion 5 4+   5 4+  Hip extension   3+ 3/5 5 5   Hip abduction   3+ 3/5 4+ 4+  Hip adduction        Hip internal rotation        Hip external rotation        Knee flexion        Knee extension 5 4+   5 5  Ankle dorsiflexion 5 4   5  4+   Ankle plantarflexion        Ankle inversion        Ankle eversion         (Blank rows = not tested)    FUNCTIONAL TESTS:  5 times sit to stand: 24.73 sec no UE assist  GAIT: Distance walked: 50 ft in clinic Assistive device utilized: Quad cane small base (adjusted at eval to appropriate height) Level of assistance: Modified independence Comments: slight antalgic gait   TODAY'S TREATMENT:  DATE:  11/10/22 Heel raises on incline x 20 Toe raises on decline x 20 4" box step ups x 10 each with theraband around left knee to decrease knee hyper extension 4" lateral step ups x 10 each with theraband around left knee to decrease knee hyper extension Marching 2 x 10 with 1 UE assist Sit to stand x 10 without UE assist Nustep seat 8 x 5' lower extremity conditioning level 4     11/02/22 Nustep seat 8 x 5' dynamic warm up Instructions on ordering AFO (ok per message from Dr. Romeo Apple) Standing: Heel raises x 20 Hip vectors 2" hold x 5 each with 1 UE assist Sit to stand 2 x 10 4" Step ups with 1 HHA  x 10 each 4" lateral step ups with 2 HHA x 10 each   10/28/22 Progress note Nustep seat 8 x 5' dynamic warm up FOTO 58 MMT's see above 5 x sit to stand 20 sec no UE assist SLS right 5"; left 2"  10/26/22 Sit to stand 2x10 Marching 2x10 Forward step up with 1 HHA 4" 2X10 each LE Lateral step up with 1 HHA 4" 2X10 each LE Leg press 3pl 2x10  10/22/22 Sit to stand x 15 Squat x 15  Marching x 5 Knee flexion 3# x 15  Step up " x 10 B  Leg press 3pl x 10 Tandem stance with head turns x 5 B    10/20/22 Nustep seat 8 level 2 dynamic warm up x 5' 0 STS controlled eccentric control with no HHA Heel raise incline slope 2x 10 Toe raises decline slope 2x 10 Squat 10x front of chair Vector stance 3x 5" with 1 HHA Sidestep with GTB around thigh- cueing to  reduce Lt LE ER 2RT inside // bars minimal HHA Forward step up 4in 10x with 2 HHA- cueing to reduce hyperextension and mechanics  AROM 0-120 degrees   10/15/22 Nustep seat 8 level 2 dynamic warm up x 5' Sitting: Hamstring stretch left 3 x 20" Standing: Lumbar extension x 10 over bar Heel raises 2 x 10 Toe raises 2 x 10 Slant board 5 x 20" 12" step left knee drives for flexion x 2' Hip vectors 3" hold x 5 each  PATIENT EDUCATION:  Education details: Updated HEP Person educated: Patient Education method: Explanation, Demonstration, and Handouts Education comprehension: verbalized understanding, returned demonstration, verbal cues required, and tactile cues required  HOME EXERCISE PROGRAM: Access Code: [email or web address] URL: [email or web address]  10/28/22 SLS  10/15/22 hip vectors  10/14/22 - Seated Hamstring Stretch  - 1-2 x daily - 7 x weekly - 3 reps - 30 hold - Seated Hip Adduction Isometrics with Ball  - 1-2 x daily - 7 x weekly - 2 sets - 10 reps - 6 hold  10/09/22: - Prone Hip Extension  - 2 x daily - 7 x weekly - 1 sets - 10 reps - 5" hold - Sidelying Hip Abduction  - 2 x daily - 7 x weekly - 1 sets - 10 reps - 5" hold - Supine Bridge  - 2 x daily - 7 x weekly - 1 sets - 10 reps - 5" hold  Date: 10/07/2022 Prepared by: [facility]  Exercises - Standing Lumbar Extension with Counter  - 5 x daily - 7 x weekly - 1 sets - 5-10 reps - Prone Press Up  - 2 x daily - 7 x weekly - 1 sets - 10 reps - Standing Single Leg Stance with  Counter Support  - 2 x daily - 7 x weekly - 1 sets - 3 reps - 10 sec hold - Sit to Stand  - 2 x daily - 7 x weekly - 1 sets - 10 reps  ASSESSMENT:  CLINICAL IMPRESSION: Late check in today; patient has not yet purchased an AFO.  Continues with decreased control with left knee extension; knee "snaps back" during stance phase".  Used a theraband to assist with control of left knee with step ups with some improvement with control noted.  Encouraged  patient to buy AFO.  Increased Nustep to level 4 today for increased conditioning with good challenge. Patient will benefit from continued skilled therapy services to address partially met and unmet goals; address deficits and promote optimal function.     Eval:  Patient is a 67 y.o. female who was seen today for physical therapy evaluation and treatment for Z96.652 (ICD-10-CM) - Status post total left knee replacement M17.12 (ICD-10-CM) - Primary osteoarthritis of left knee. Patient demonstrates muscle weakness, reduced ROM, and fascial restrictions which are likely contributing to symptoms of pain and are negatively impacting patient ability to perform ADLs and functional mobility tasks. Patient will benefit from skilled physical therapy services to address these deficits to reduce pain and improve level of function with ADLs and functional mobility tasks.   OBJECTIVE IMPAIRMENTS: Abnormal gait, decreased activity tolerance, decreased balance, decreased endurance, decreased knowledge of condition, decreased mobility, difficulty walking, decreased strength, increased fascial restrictions, and impaired perceived functional ability.   ACTIVITY LIMITATIONS: carrying, lifting, bending, sitting, standing, squatting, sleeping, and locomotion level  PARTICIPATION LIMITATIONS: meal prep, cleaning, laundry, shopping, community activity, and yard work  [facility] POTENTIAL: Good  CLINICAL DECISION MAKING: Stable/uncomplicated  EVALUATION COMPLEXITY: Low   GOALS: Goals reviewed with patient? No  SHORT TERM GOALS: Target date: 10/21/2022 patient will be independent with initial HEP  Baseline: Goal status: met  2.  Patient will self report 30% improvement to improve tolerance for functional activity  Baseline:  Goal status: IN PROGRESS  LONG TERM GOALS: Target date: 11/04/2022  Patient will be independent in self management strategies to improve quality of life and functional outcomes.  Baseline:   Goal status: IN PROGRESS  2.  Patient will self report 50% improvement to improve tolerance for functional activity  Baseline:  Goal status: IN PROGRESS  3.  Patient will increase left leg MMTs to 5/5 without pain to promote return to ambulation community distances with minimal deviation.  Baseline: see above Goal status: IN PROGRESS  4.  Patient will remain free of falls Baseline: 10/28/22 no new falls Goal status: IN PROGRESS  5.  Patient will improve 5 times sit to stand score from 24.73 sec to 18 sec to demonstrate improved functional mobility and increased lower extremity strength.   Baseline: 10/28/22 20 sec Goal status: IN PROGRESS  6.  Patient will ambulate x 224 ft without AD and minimal gait deviation to improve safety with ambulating household distances. Baseline: ambulates with QC Goal status: IN PROGRESS  7. Patient will be able to stand SLS x 10" each to demonstrate improved functional balance  Baseline: 5" on right and 2" on left  Goal status: in progress   PLAN:  PT FREQUENCY: 2x/week  PT DURATION: 4 weeks  PLANNED INTERVENTIONS: Therapeutic exercises, Therapeutic activity, Neuromuscular re-education, Balance training, Gait training, Patient/Family education, Joint manipulation, Joint mobilization, Stair training, Orthotic/Fit training, DME instructions, Aquatic Therapy, Dry Needling, Electrical stimulation, Spinal manipulation, Spinal mobilization, Cryotherapy, Moist heat,  Compression bandaging, scar mobilization, Splintting, Taping, Traction, Ultrasound, Ionotophoresis 4mg /ml Dexamethasone, and Manual therapy   PLAN FOR NEXT SESSION: Continue POC and may progress as tolerated with emphasis on gait; lower extremity strengthening, balance; follow up with patient ordering AFO for left foot  1:44 PM, 11/10/22 Cordon Gassett Small Aizza Santiago MPT [facility] [place] [phone] Ph:[phone]

## 2022-11-12 ENCOUNTER — Ambulatory Visit (HOSPITAL_COMMUNITY): Payer: Federal, State, Local not specified - PPO

## 2022-11-12 ENCOUNTER — Encounter (HOSPITAL_COMMUNITY): Payer: Self-pay

## 2022-11-12 DIAGNOSIS — M25662 Stiffness of left knee, not elsewhere classified: Secondary | ICD-10-CM

## 2022-11-12 DIAGNOSIS — M6281 Muscle weakness (generalized): Secondary | ICD-10-CM

## 2022-11-12 DIAGNOSIS — G8929 Other chronic pain: Secondary | ICD-10-CM

## 2022-11-12 DIAGNOSIS — R262 Difficulty in walking, not elsewhere classified: Secondary | ICD-10-CM

## 2022-11-12 DIAGNOSIS — R2689 Other abnormalities of gait and mobility: Secondary | ICD-10-CM

## 2022-11-12 DIAGNOSIS — R29898 Other symptoms and signs involving the musculoskeletal system: Secondary | ICD-10-CM

## 2022-11-12 NOTE — Therapy (Signed)
OUTPATIENT PHYSICAL THERAPY LOWER EXTREMITY TREATMENT     Patient Name: Pamela Stein MRN: [id number] DOB:1955/06/27, 67 y.o., female Today's Date: 11/12/2022  END OF SESSION:  PT End of Session - 11/12/22 1347     Visit Number 11    Number of Visits 19    Date for PT Re-Evaluation 11/25/22    Authorization Type [employer]    PT Start Time 1305    PT Stop Time 1349    PT Time Calculation (min) 44 min    Activity Tolerance Patient tolerated treatment well    Behavior During Therapy WFL for tasks assessed/performed              Past Medical History:  Diagnosis Date   Anxiety    Back pain    Bronchitis    Diabetes mellitus    Dizziness    Heart murmur    History of gout    Hyperlipidemia    Hypertension    Neuropathy    Obesity    Obstructive sleep apnea    CPAP   Past Surgical History:  Procedure Laterality Date   ABDOMINAL HYSTERECTOMY     [facility]   ADENOIDECTOMY     CATARACT EXTRACTION Left    right 02/2018   COLONOSCOPY  2008   diminutive rectal polyp, s/p removal. polypoid mucosa   COLONOSCOPY WITH PROPOFOL N/A 01/03/2018   Procedure: COLONOSCOPY WITH PROPOFOL;  Surgeon: Corbin Ade, MD;  Location: [facility];  Service: Endoscopy;  Laterality: N/A;  12:00pm   DILATION AND CURETTAGE OF UTERUS     ENDOMETRIAL ABLATION     EYE SURGERY Bilateral 12/04/2013   cataract   PARATHYROIDECTOMY N/A 12/22/2016   Procedure: PARATHYROIDECTOMY, NECK EXPLORATION;  Surgeon: Avel Peace, MD;  Location: [facility];  Service: General;  Laterality: N/A;   TONSILLECTOMY     TOTAL KNEE ARTHROPLASTY Left 05/28/2020   Procedure: LEFT TOTAL KNEE ARTHROPLASTY;  Surgeon: Vickki Hearing, MD;  Location: [facility];  Service: Orthopedics;  Laterality: Left;   WOUND EXPLORATION Left 05/31/2020   Procedure: WOUND EXPLORATION AND IRRIGATION LEFT KNEE;  Surgeon: Vickki Hearing, MD;  Location: [facility];  Service: Orthopedics;  Laterality: Left;  irrigation wound exploration,  wound closure   Patient Active Problem List   Diagnosis Date Noted   S/P abdominal supracervical subtotal hysterectomy 10/08/2020   Encounter for screening fecal occult blood testing 10/08/2020   Encounter for gynecological examination with Papanicolaou smear of cervix 10/08/2020   Chronic pain of left knee 05/31/2020   Wound dehiscence, surgical    Status post total left knee replacement 05/28/2020   Primary osteoarthritis of left knee    Polyneuropathy due to secondary diabetes mellitus (HCC) 05/25/2019   Arthritis of right sacroiliac joint (HCC) 05/25/2019   Cervical disc disorder 05/25/2019   Falls 05/25/2019   General unsteadiness 05/25/2019   Encounter for well woman exam with routine gynecological exam 01/09/2019   Screening for colorectal cancer 01/09/2019   Encounter for screening colonoscopy 12/13/2017   Hyperparathyroidism, primary (HCC) 12/22/2016   Vitamin D deficiency 10/31/2015   Hyperuricemia 05/08/2013   ONYCHOMYCOSIS, TOENAILS 02/22/2009   Benign neoplasm of skin 02/22/2009   Diabetes mellitus (HCC) 01/25/2008   BACK PAIN WITH RADICULOPATHY 11/09/2007   Hyperlipidemia LDL goal <100 08/26/2007   Morbid obesity (HCC) 08/26/2007   Essential hypertension 08/26/2007   Obstructive sleep apnea 04/20/2007    PCP: Avon Gully  REFERRING PROVIDER: Vickki Hearing, MD  REFERRING DIAG: [phone] (ICD-10-CM) -  Status post total left knee replacement M17.12 (ICD-10-CM) - Primary osteoarthritis of left knee  THERAPY DIAG:  Difficulty in walking, not elsewhere classified  Stiffness of left knee, not elsewhere classified  Chronic pain of left knee  Other abnormalities of gait and mobility  Weakness of left leg  Muscle weakness (generalized)  Rationale for Evaluation and Treatment: Rehabilitation  ONSET DATE: 05/2020  SUBJECTIVE:   SUBJECTIVE STATEMENT: Hasn't order AFO yet.  No reoprts of pain today.    Eval:  History of left TKA in 2022 per Dr  Romeo Apple.  Larey Seat in late July off a step; walks in today with QC; sometimes that left knee will give way; retired; reports walks with QC at baseline; wall walks at home; reports some numbness left lateral knee to foot  PERTINENT HISTORY: S/p 05/2020 PAIN:  Are you having pain?no pain  PRECAUTIONS: Fall    WEIGHT BEARING RESTRICTIONS: No  FALLS:  Has patient fallen in last 6 months? Yes. Number of falls 4    OCCUPATION: retired  PLOF: Independent;   PATIENT GOALS: get rid of my cane; walk without my cane  NEXT MD VISIT: as needed; sees Romeo Apple yearly  OBJECTIVE:   DIAGNOSTIC FINDINGS: X-ray report left knee   Status post left knee   X-ray shows no fracture dislocation loosening or malalignment   There may be some patella Baha   Impression possible patella Baha normal appearing implant in terms of no loosening and no malalignment  PATIENT SURVEYS:  FOTO 50  COGNITION: Overall cognitive status: Within functional limits for tasks assessed     SENSATION: WFL  EDEMA:  None noted   POSTURE: rounded shoulders and forward head  PALPATION: No tenderness noted left knee  LOWER EXTREMITY ROM:  Active ROM Right eval Left eval  Hip flexion    Hip extension    Hip abduction    Hip adduction    Hip internal rotation    Hip external rotation    Knee flexion    Knee extension    Ankle dorsiflexion    Ankle plantarflexion    Ankle inversion    Ankle eversion    Lumbar flexion 80% available    Lumbar extension 15% available     (Blank rows = not tested)  LOWER EXTREMITY MMT:  MMT Right eval Left eval Right 10/09/22 Left 10/09/22 Right 10/28/22 Left 10/28/22  Hip flexion 5 4+   5 4+  Hip extension   3+ 3/5 5 5   Hip abduction   3+ 3/5 4+ 4+  Hip adduction        Hip internal rotation        Hip external rotation        Knee flexion        Knee extension 5 4+   5 5  Ankle dorsiflexion 5 4   5  4+  Ankle plantarflexion        Ankle inversion         Ankle eversion         (Blank rows = not tested)    FUNCTIONAL TESTS:  5 times sit to stand: 24.73 sec no UE assist  GAIT: Distance walked: 50 ft in clinic Assistive device utilized: Quad cane small base (adjusted at eval to appropriate height) Level of assistance: Modified independence Comments: slight antalgic gait   TODAY'S TREATMENT:  DATE:  11/12/22 Heel raises on incline x 20 Toe raises on decline x 20 10 STS controlled eccentric control with no HHA Squats front of chair, 4 sets 5 reps; cueing for mechanics 4" box step ups x 10 each with theraband around left knee to decrease knee hyper extension 4" lateral step ups x 10 each with theraband around left knee to decrease knee hyper extension 4" step down 10x   11/10/22 Heel raises on incline x 20 Toe raises on decline x 20 4" box step ups x 10 each with theraband around left knee to decrease knee hyper extension 4" lateral step ups x 10 each with theraband around left knee to decrease knee hyper extension Marching 2 x 10 with 1 UE assist Sit to stand x 10 without UE assist Nustep seat 8 x 5' lower extremity conditioning level 4     11/02/22 Nustep seat 8 x 5' dynamic warm up Instructions on ordering AFO (ok per message from Dr. Romeo Apple) Standing: Heel raises x 20 Hip vectors 2" hold x 5 each with 1 UE assist Sit to stand 2 x 10 4" Step ups with 1 HHA  x 10 each 4" lateral step ups with 2 HHA x 10 each   10/28/22 Progress note Nustep seat 8 x 5' dynamic warm up FOTO 58 MMT's see above 5 x sit to stand 20 sec no UE assist SLS right 5"; left 2"  10/26/22 Sit to stand 2x10 Marching 2x10 Forward step up with 1 HHA 4" 2X10 each LE Lateral step up with 1 HHA 4" 2X10 each LE Leg press 3pl 2x10  10/22/22 Sit to stand x 15 Squat x 15  Marching x 5 Knee flexion 3# x 15  Step up " x 10  B  Leg press 3pl x 10 Tandem stance with head turns x 5 B    10/20/22 Nustep seat 8 level 2 dynamic warm up x 5' 0 STS controlled eccentric control with no HHA Heel raise incline slope 2x 10 Toe raises decline slope 2x 10 Squat 10x front of chair Vector stance 3x 5" with 1 HHA Sidestep with GTB around thigh- cueing to reduce Lt LE ER 2RT inside // bars minimal HHA Forward step up 4in 10x with 2 HHA- cueing to reduce hyperextension and mechanics  AROM 0-120 degrees   10/15/22 Nustep seat 8 level 2 dynamic warm up x 5' Sitting: Hamstring stretch left 3 x 20" Standing: Lumbar extension x 10 over bar Heel raises 2 x 10 Toe raises 2 x 10 Slant board 5 x 20" 12" step left knee drives for flexion x 2' Hip vectors 3" hold x 5 each  PATIENT EDUCATION:  Education details: Updated HEP Person educated: Patient Education method: Explanation, Demonstration, and Handouts Education comprehension: verbalized understanding, returned demonstration, verbal cues required, and tactile cues required  HOME EXERCISE PROGRAM: Access Code: [email or web address] URL: [email or web address]  10/28/22 SLS  10/15/22 hip vectors  10/14/22 - Seated Hamstring Stretch  - 1-2 x daily - 7 x weekly - 3 reps - 30 hold - Seated Hip Adduction Isometrics with Ball  - 1-2 x daily - 7 x weekly - 2 sets - 10 reps - 6 hold  10/09/22: - Prone Hip Extension  - 2 x daily - 7 x weekly - 1 sets - 10 reps - 5" hold - Sidelying Hip Abduction  - 2 x daily - 7 x weekly - 1 sets - 10 reps - 5" hold - Supine Bridge  -  2 x daily - 7 x weekly - 1 sets - 10 reps - 5" hold  Date: 10/07/2022 Prepared by: [facility]  Exercises - Standing Lumbar Extension with Counter  - 5 x daily - 7 x weekly - 1 sets - 5-10 reps - Prone Press Up  - 2 x daily - 7 x weekly - 1 sets - 10 reps - Standing Single Leg Stance with Counter Support  - 2 x daily - 7 x weekly - 1 sets - 3 reps - 10 sec hold - Sit to Stand  - 2 x daily - 7 x weekly - 1 sets  - 10 reps  ASSESSMENT:  CLINICAL IMPRESSION: Pt educated purpose of AFO and given list of local places for fitting and purchase.  Session focus with functional strengthening.  Pt continues to have decreased control with knee hyperextension, knee "snaps back" during stance phase.  Multimodal cueing for control with continued difficulty noted.  No reports of pain, was limited by fatigue required intermittent seated rest breaks.   Eval:  Patient is a 67 y.o. female who was seen today for physical therapy evaluation and treatment for Z96.652 (ICD-10-CM) - Status post total left knee replacement M17.12 (ICD-10-CM) - Primary osteoarthritis of left knee. Patient demonstrates muscle weakness, reduced ROM, and fascial restrictions which are likely contributing to symptoms of pain and are negatively impacting patient ability to perform ADLs and functional mobility tasks. Patient will benefit from skilled physical therapy services to address these deficits to reduce pain and improve level of function with ADLs and functional mobility tasks.   OBJECTIVE IMPAIRMENTS: Abnormal gait, decreased activity tolerance, decreased balance, decreased endurance, decreased knowledge of condition, decreased mobility, difficulty walking, decreased strength, increased fascial restrictions, and impaired perceived functional ability.   ACTIVITY LIMITATIONS: carrying, lifting, bending, sitting, standing, squatting, sleeping, and locomotion level  PARTICIPATION LIMITATIONS: meal prep, cleaning, laundry, shopping, community activity, and yard work  [facility] POTENTIAL: Good  CLINICAL DECISION MAKING: Stable/uncomplicated  EVALUATION COMPLEXITY: Low   GOALS: Goals reviewed with patient? No  SHORT TERM GOALS: Target date: 10/21/2022 patient will be independent with initial HEP  Baseline: Goal status: met  2.  Patient will self report 30% improvement to improve tolerance for functional activity  Baseline:  Goal status: IN  PROGRESS  LONG TERM GOALS: Target date: 11/04/2022  Patient will be independent in self management strategies to improve quality of life and functional outcomes.  Baseline:  Goal status: IN PROGRESS  2.  Patient will self report 50% improvement to improve tolerance for functional activity  Baseline:  Goal status: IN PROGRESS  3.  Patient will increase left leg MMTs to 5/5 without pain to promote return to ambulation community distances with minimal deviation.  Baseline: see above Goal status: IN PROGRESS  4.  Patient will remain free of falls Baseline: 10/28/22 no new falls Goal status: IN PROGRESS  5.  Patient will improve 5 times sit to stand score from 24.73 sec to 18 sec to demonstrate improved functional mobility and increased lower extremity strength.   Baseline: 10/28/22 20 sec Goal status: IN PROGRESS  6.  Patient will ambulate x 224 ft without AD and minimal gait deviation to improve safety with ambulating household distances. Baseline: ambulates with QC Goal status: IN PROGRESS  7. Patient will be able to stand SLS x 10" each to demonstrate improved functional balance  Baseline: 5" on right and 2" on left  Goal status: in progress   PLAN:  PT FREQUENCY:  2x/week  PT DURATION: 4 weeks  PLANNED INTERVENTIONS: Therapeutic exercises, Therapeutic activity, Neuromuscular re-education, Balance training, Gait training, Patient/Family education, Joint manipulation, Joint mobilization, Stair training, Orthotic/Fit training, DME instructions, Aquatic Therapy, Dry Needling, Electrical stimulation, Spinal manipulation, Spinal mobilization, Cryotherapy, Moist heat, Compression bandaging, scar mobilization, Splintting, Taping, Traction, Ultrasound, Ionotophoresis 4mg /ml Dexamethasone, and Manual therapy   PLAN FOR NEXT SESSION: Continue POC and may progress as tolerated with emphasis on gait; lower extremity strengthening, balance; follow up with patient ordering AFO for left  foot  Becky Sax, LPTA/CLT; CBIS [phone]   Juel Burrow, PTA 11/12/2022, 3:06 PM  3:06 PM, 11/12/22

## 2022-11-16 ENCOUNTER — Encounter (HOSPITAL_COMMUNITY): Payer: Self-pay

## 2022-11-16 ENCOUNTER — Ambulatory Visit (HOSPITAL_COMMUNITY): Payer: Federal, State, Local not specified - PPO

## 2022-11-16 DIAGNOSIS — G8929 Other chronic pain: Secondary | ICD-10-CM

## 2022-11-16 DIAGNOSIS — R29898 Other symptoms and signs involving the musculoskeletal system: Secondary | ICD-10-CM

## 2022-11-16 DIAGNOSIS — M6281 Muscle weakness (generalized): Secondary | ICD-10-CM

## 2022-11-16 DIAGNOSIS — R262 Difficulty in walking, not elsewhere classified: Secondary | ICD-10-CM

## 2022-11-16 DIAGNOSIS — M25662 Stiffness of left knee, not elsewhere classified: Secondary | ICD-10-CM

## 2022-11-16 DIAGNOSIS — R2689 Other abnormalities of gait and mobility: Secondary | ICD-10-CM

## 2022-11-16 NOTE — Therapy (Signed)
OUTPATIENT PHYSICAL THERAPY LOWER EXTREMITY TREATMENT     Patient Name: Pamela Stein MRN: [id number] DOB:12-28-1955, 67 y.o., female Today's Date: 11/16/2022  END OF SESSION:  PT End of Session - 11/16/22 1038     Visit Number 12    Number of Visits 19    Date for PT Re-Evaluation 11/25/22    Authorization Type [employer]    PT Start Time 1038   Late sign in   PT Stop Time 1118    PT Time Calculation (min) 40 min    Activity Tolerance Patient tolerated treatment well    Behavior During Therapy WFL for tasks assessed/performed              Past Medical History:  Diagnosis Date   Anxiety    Back pain    Bronchitis    Diabetes mellitus    Dizziness    Heart murmur    History of gout    Hyperlipidemia    Hypertension    Neuropathy    Obesity    Obstructive sleep apnea    CPAP   Past Surgical History:  Procedure Laterality Date   ABDOMINAL HYSTERECTOMY     [facility]   ADENOIDECTOMY     CATARACT EXTRACTION Left    right 02/2018   COLONOSCOPY  2008   diminutive rectal polyp, s/p removal. polypoid mucosa   COLONOSCOPY WITH PROPOFOL N/A 01/03/2018   Procedure: COLONOSCOPY WITH PROPOFOL;  Surgeon: Corbin Ade, MD;  Location: [facility];  Service: Endoscopy;  Laterality: N/A;  12:00pm   DILATION AND CURETTAGE OF UTERUS     ENDOMETRIAL ABLATION     EYE SURGERY Bilateral 12/04/2013   cataract   PARATHYROIDECTOMY N/A 12/22/2016   Procedure: PARATHYROIDECTOMY, NECK EXPLORATION;  Surgeon: Avel Peace, MD;  Location: [facility];  Service: General;  Laterality: N/A;   TONSILLECTOMY     TOTAL KNEE ARTHROPLASTY Left 05/28/2020   Procedure: LEFT TOTAL KNEE ARTHROPLASTY;  Surgeon: Vickki Hearing, MD;  Location: [facility];  Service: Orthopedics;  Laterality: Left;   WOUND EXPLORATION Left 05/31/2020   Procedure: WOUND EXPLORATION AND IRRIGATION LEFT KNEE;  Surgeon: Vickki Hearing, MD;  Location: [facility];  Service: Orthopedics;  Laterality: Left;  irrigation wound  exploration, wound closure   Patient Active Problem List   Diagnosis Date Noted   S/P abdominal supracervical subtotal hysterectomy 10/08/2020   Encounter for screening fecal occult blood testing 10/08/2020   Encounter for gynecological examination with Papanicolaou smear of cervix 10/08/2020   Chronic pain of left knee 05/31/2020   Wound dehiscence, surgical    Status post total left knee replacement 05/28/2020   Primary osteoarthritis of left knee    Polyneuropathy due to secondary diabetes mellitus (HCC) 05/25/2019   Arthritis of right sacroiliac joint (HCC) 05/25/2019   Cervical disc disorder 05/25/2019   Falls 05/25/2019   General unsteadiness 05/25/2019   Encounter for well woman exam with routine gynecological exam 01/09/2019   Screening for colorectal cancer 01/09/2019   Encounter for screening colonoscopy 12/13/2017   Hyperparathyroidism, primary (HCC) 12/22/2016   Vitamin D deficiency 10/31/2015   Hyperuricemia 05/08/2013   ONYCHOMYCOSIS, TOENAILS 02/22/2009   Benign neoplasm of skin 02/22/2009   Diabetes mellitus (HCC) 01/25/2008   BACK PAIN WITH RADICULOPATHY 11/09/2007   Hyperlipidemia LDL goal <100 08/26/2007   Morbid obesity (HCC) 08/26/2007   Essential hypertension 08/26/2007   Obstructive sleep apnea 04/20/2007    PCP: Avon Gully  REFERRING PROVIDER: Vickki Hearing, MD  REFERRING DIAG: W09.811 (ICD-10-CM) - Status post total left knee replacement M17.12 (ICD-10-CM) - Primary osteoarthritis of left knee  THERAPY DIAG:  Difficulty in walking, not elsewhere classified  Stiffness of left knee, not elsewhere classified  Chronic pain of left knee  Other abnormalities of gait and mobility  Weakness of left leg  Muscle weakness (generalized)  Rationale for Evaluation and Treatment: Rehabilitation  ONSET DATE: 05/2020  SUBJECTIVE:   SUBJECTIVE STATEMENT: Pt arrived tearful.  Reports she fell asleep and fell off the couch over weekend.  Reports  her right hip is a little sore.  Was able to get up by herself, did take a moment to get started.  Stated she has not ordered AFO yet.  Reports a decreased slapping on foot during gait.   Eval:  History of left TKA in 2022 per Dr Romeo Apple.  Larey Seat in late July off a step; walks in today with QC; sometimes that left knee will give way; retired; reports walks with QC at baseline; wall walks at home; reports some numbness left lateral knee to foot  PERTINENT HISTORY: S/p 05/2020 PAIN:  Are you having pain?no pain  PRECAUTIONS: Fall    WEIGHT BEARING RESTRICTIONS: No  FALLS:  Has patient fallen in last 6 months? Yes. Number of falls 4    OCCUPATION: retired  PLOF: Independent;   PATIENT GOALS: get rid of my cane; walk without my cane  NEXT MD VISIT: as needed; sees Romeo Apple yearly  OBJECTIVE:   DIAGNOSTIC FINDINGS: X-ray report left knee   Status post left knee   X-ray shows no fracture dislocation loosening or malalignment   There may be some patella Baha   Impression possible patella Baha normal appearing implant in terms of no loosening and no malalignment  PATIENT SURVEYS:  FOTO 50  COGNITION: Overall cognitive status: Within functional limits for tasks assessed     SENSATION: WFL  EDEMA:  None noted   POSTURE: rounded shoulders and forward head  PALPATION: No tenderness noted left knee  LOWER EXTREMITY ROM:  Active ROM Right eval Left eval  Hip flexion    Hip extension    Hip abduction    Hip adduction    Hip internal rotation    Hip external rotation    Knee flexion    Knee extension    Ankle dorsiflexion    Ankle plantarflexion    Ankle inversion    Ankle eversion    Lumbar flexion 80% available    Lumbar extension 15% available     (Blank rows = not tested)  LOWER EXTREMITY MMT:  MMT Right eval Left eval Right 10/09/22 Left 10/09/22 Right 10/28/22 Left 10/28/22  Hip flexion 5 4+   5 4+  Hip extension   3+ 3/5 5 5   Hip abduction    3+ 3/5 4+ 4+  Hip adduction        Hip internal rotation        Hip external rotation        Knee flexion        Knee extension 5 4+   5 5  Ankle dorsiflexion 5 4   5  4+  Ankle plantarflexion        Ankle inversion        Ankle eversion         (Blank rows = not tested)    FUNCTIONAL TESTS:  5 times sit to stand: 24.73 sec no UE assist  GAIT: Distance walked: 50 ft in clinic  Assistive device utilized: Counselling psychologist (adjusted at eval to appropriate height) Level of assistance: Modified independence Comments: slight antalgic gait   TODAY'S TREATMENT:                                                                                                                              DATE:  10 STS controlled eccentric control with no HHA Squats front of chair, 2 setsx 10 reps; cueing for mechanics Heel raises on incline x 20 Toe raises on decline x 20  11/12/22 Heel raises on incline x 20 Toe raises on decline x 20 10 STS controlled eccentric control with no HHA Squats front of chair, 4 sets 5 reps; cueing for mechanics 4" box step ups x 10 each with theraband around left knee to decrease knee hyper extension 4" lateral step ups x 10 each with theraband around left knee to decrease knee hyper extension 4" step down 10x   11/10/22 Heel raises on incline x 20 Toe raises on decline x 20 4" box step ups x 10 each with theraband around left knee to decrease knee hyper extension 4" lateral step ups x 10 each with theraband around left knee to decrease knee hyper extension Marching 2 x 10 with 1 UE assist Sit to stand x 10 without UE assist Nustep seat 8 x 5' lower extremity conditioning level 4     11/02/22 Nustep seat 8 x 5' dynamic warm up Instructions on ordering AFO (ok per message from Dr. Romeo Apple) Standing: Heel raises x 20 Hip vectors 2" hold x 5 each with 1 UE assist Sit to stand 2 x 10 4" Step ups with 1 HHA  x 10 each 4" lateral step ups with 2 HHA x 10  each   10/28/22 Progress note Nustep seat 8 x 5' dynamic warm up FOTO 58 MMT's see above 5 x sit to stand 20 sec no UE assist SLS right 5"; left 2"  10/26/22 Sit to stand 2x10 Marching 2x10 Forward step up with 1 HHA 4" 2X10 each LE Lateral step up with 1 HHA 4" 2X10 each LE Leg press 3pl 2x10  10/22/22 Sit to stand x 15 Squat x 15  Marching x 5 Knee flexion 3# x 15  Step up " x 10 B  Leg press 3pl x 10 Tandem stance with head turns x 5 B    10/20/22 Nustep seat 8 level 2 dynamic warm up x 5' 0 STS controlled eccentric control with no HHA Heel raise incline slope 2x 10 Toe raises decline slope 2x 10 Squat 10x front of chair Vector stance 3x 5" with 1 HHA Sidestep with GTB around thigh- cueing to reduce Lt LE ER 2RT inside // bars minimal HHA Forward step up 4in 10x with 2 HHA- cueing to reduce hyperextension and mechanics  AROM 0-120 degrees   10/15/22 Nustep seat 8 level 2 dynamic warm up x 5' Sitting: Hamstring stretch left 3  x 20" Standing: Lumbar extension x 10 over bar Heel raises 2 x 10 Toe raises 2 x 10 Slant board 5 x 20" 12" step left knee drives for flexion x 2' Hip vectors 3" hold x 5 each  PATIENT EDUCATION:  Education details: Updated HEP Person educated: Patient Education method: Explanation, Demonstration, and Handouts Education comprehension: verbalized understanding, returned demonstration, verbal cues required, and tactile cues required  HOME EXERCISE PROGRAM: Access Code: [email or web address] URL: [email or web address]  10/28/22 SLS  10/15/22 hip vectors  10/14/22 - Seated Hamstring Stretch  - 1-2 x daily - 7 x weekly - 3 reps - 30 hold - Seated Hip Adduction Isometrics with Ball  - 1-2 x daily - 7 x weekly - 2 sets - 10 reps - 6 hold  10/09/22: - Prone Hip Extension  - 2 x daily - 7 x weekly - 1 sets - 10 reps - 5" hold - Sidelying Hip Abduction  - 2 x daily - 7 x weekly - 1 sets - 10 reps - 5" hold - Supine Bridge  - 2 x daily - 7 x  weekly - 1 sets - 10 reps - 5" hold  Date: 10/07/2022 Prepared by: [facility]  Exercises - Standing Lumbar Extension with Counter  - 5 x daily - 7 x weekly - 1 sets - 5-10 reps - Prone Press Up  - 2 x daily - 7 x weekly - 1 sets - 10 reps - Standing Single Leg Stance with Counter Support  - 2 x daily - 7 x weekly - 1 sets - 3 reps - 10 sec hold - Sit to Stand  - 2 x daily - 7 x weekly - 1 sets - 10 reps  ASSESSMENT:  CLINICAL IMPRESSION: Session focus with LE functional strengthening.  Pt presents with improved motor control though does require cueing for awareness of knee alignment.  No reports of pain through session, was limited by fatigue required intermittent short seated rest breaks this session.    Eval:  Patient is a 67 y.o. female who was seen today for physical therapy evaluation and treatment for Z96.652 (ICD-10-CM) - Status post total left knee replacement M17.12 (ICD-10-CM) - Primary osteoarthritis of left knee. Patient demonstrates muscle weakness, reduced ROM, and fascial restrictions which are likely contributing to symptoms of pain and are negatively impacting patient ability to perform ADLs and functional mobility tasks. Patient will benefit from skilled physical therapy services to address these deficits to reduce pain and improve level of function with ADLs and functional mobility tasks.   OBJECTIVE IMPAIRMENTS: Abnormal gait, decreased activity tolerance, decreased balance, decreased endurance, decreased knowledge of condition, decreased mobility, difficulty walking, decreased strength, increased fascial restrictions, and impaired perceived functional ability.   ACTIVITY LIMITATIONS: carrying, lifting, bending, sitting, standing, squatting, sleeping, and locomotion level  PARTICIPATION LIMITATIONS: meal prep, cleaning, laundry, shopping, community activity, and yard work  [facility] POTENTIAL: Good  CLINICAL DECISION MAKING: Stable/uncomplicated  EVALUATION COMPLEXITY:  Low   GOALS: Goals reviewed with patient? No  SHORT TERM GOALS: Target date: 10/21/2022 patient will be independent with initial HEP  Baseline: Goal status: met  2.  Patient will self report 30% improvement to improve tolerance for functional activity  Baseline:  Goal status: IN PROGRESS  LONG TERM GOALS: Target date: 11/04/2022  Patient will be independent in self management strategies to improve quality of life and functional outcomes.  Baseline:  Goal status: IN PROGRESS  2.  Patient will self report 50% improvement to improve  tolerance for functional activity  Baseline:  Goal status: IN PROGRESS  3.  Patient will increase left leg MMTs to 5/5 without pain to promote return to ambulation community distances with minimal deviation.  Baseline: see above Goal status: IN PROGRESS  4.  Patient will remain free of falls Baseline: 10/28/22 no new falls Goal status: IN PROGRESS  5.  Patient will improve 5 times sit to stand score from 24.73 sec to 18 sec to demonstrate improved functional mobility and increased lower extremity strength.   Baseline: 10/28/22 20 sec Goal status: IN PROGRESS  6.  Patient will ambulate x 224 ft without AD and minimal gait deviation to improve safety with ambulating household distances. Baseline: ambulates with QC Goal status: IN PROGRESS  7. Patient will be able to stand SLS x 10" each to demonstrate improved functional balance  Baseline: 5" on right and 2" on left  Goal status: in progress   PLAN:  PT FREQUENCY: 2x/week  PT DURATION: 4 weeks  PLANNED INTERVENTIONS: Therapeutic exercises, Therapeutic activity, Neuromuscular re-education, Balance training, Gait training, Patient/Family education, Joint manipulation, Joint mobilization, Stair training, Orthotic/Fit training, DME instructions, Aquatic Therapy, Dry Needling, Electrical stimulation, Spinal manipulation, Spinal mobilization, Cryotherapy, Moist heat, Compression bandaging,  scar mobilization, Splintting, Taping, Traction, Ultrasound, Ionotophoresis 4mg /ml Dexamethasone, and Manual therapy   PLAN FOR NEXT SESSION: Continue POC and may progress as tolerated with emphasis on gait; lower extremity strengthening, balance; follow up with patient ordering AFO for left foot  Becky Sax, LPTA/CLT; CBIS [phone]   Juel Burrow, PTA 11/16/2022, 12:29 PM  12:29 PM, 11/16/22

## 2022-11-18 ENCOUNTER — Ambulatory Visit (HOSPITAL_COMMUNITY): Payer: Federal, State, Local not specified - PPO

## 2022-11-18 ENCOUNTER — Encounter (HOSPITAL_COMMUNITY): Payer: Self-pay

## 2022-11-18 DIAGNOSIS — R2689 Other abnormalities of gait and mobility: Secondary | ICD-10-CM

## 2022-11-18 DIAGNOSIS — R262 Difficulty in walking, not elsewhere classified: Secondary | ICD-10-CM | POA: Diagnosis not present

## 2022-11-18 DIAGNOSIS — M6281 Muscle weakness (generalized): Secondary | ICD-10-CM

## 2022-11-18 DIAGNOSIS — R29898 Other symptoms and signs involving the musculoskeletal system: Secondary | ICD-10-CM

## 2022-11-18 DIAGNOSIS — G8929 Other chronic pain: Secondary | ICD-10-CM

## 2022-11-18 DIAGNOSIS — M25662 Stiffness of left knee, not elsewhere classified: Secondary | ICD-10-CM

## 2022-11-18 NOTE — Therapy (Signed)
OUTPATIENT PHYSICAL THERAPY LOWER EXTREMITY TREATMENT     Patient Name: Pamela Stein MRN: [id number] DOB:1955/10/16, 67 y.o., female Today's Date: 11/18/2022  END OF SESSION:  PT End of Session - 11/18/22 1121     Visit Number 13    Number of Visits 19    Date for PT Re-Evaluation 11/25/22    Authorization Type [employer]    PT Start Time 1121    PT Stop Time 1203    PT Time Calculation (min) 42 min    Activity Tolerance Patient tolerated treatment well;Patient limited by fatigue    Behavior During Therapy WFL for tasks assessed/performed              Past Medical History:  Diagnosis Date   Anxiety    Back pain    Bronchitis    Diabetes mellitus    Dizziness    Heart murmur    History of gout    Hyperlipidemia    Hypertension    Neuropathy    Obesity    Obstructive sleep apnea    CPAP   Past Surgical History:  Procedure Laterality Date   ABDOMINAL HYSTERECTOMY     [facility]   ADENOIDECTOMY     CATARACT EXTRACTION Left    right 02/2018   COLONOSCOPY  2008   diminutive rectal polyp, s/p removal. polypoid mucosa   COLONOSCOPY WITH PROPOFOL N/A 01/03/2018   Procedure: COLONOSCOPY WITH PROPOFOL;  Surgeon: Corbin Ade, MD;  Location: [facility];  Service: Endoscopy;  Laterality: N/A;  12:00pm   DILATION AND CURETTAGE OF UTERUS     ENDOMETRIAL ABLATION     EYE SURGERY Bilateral 12/04/2013   cataract   PARATHYROIDECTOMY N/A 12/22/2016   Procedure: PARATHYROIDECTOMY, NECK EXPLORATION;  Surgeon: Avel Peace, MD;  Location: [facility];  Service: General;  Laterality: N/A;   TONSILLECTOMY     TOTAL KNEE ARTHROPLASTY Left 05/28/2020   Procedure: LEFT TOTAL KNEE ARTHROPLASTY;  Surgeon: Vickki Hearing, MD;  Location: [facility];  Service: Orthopedics;  Laterality: Left;   WOUND EXPLORATION Left 05/31/2020   Procedure: WOUND EXPLORATION AND IRRIGATION LEFT KNEE;  Surgeon: Vickki Hearing, MD;  Location: [facility];  Service: Orthopedics;  Laterality: Left;   irrigation wound exploration, wound closure   Patient Active Problem List   Diagnosis Date Noted   S/P abdominal supracervical subtotal hysterectomy 10/08/2020   Encounter for screening fecal occult blood testing 10/08/2020   Encounter for gynecological examination with Papanicolaou smear of cervix 10/08/2020   Chronic pain of left knee 05/31/2020   Wound dehiscence, surgical    Status post total left knee replacement 05/28/2020   Primary osteoarthritis of left knee    Polyneuropathy due to secondary diabetes mellitus (HCC) 05/25/2019   Arthritis of right sacroiliac joint (HCC) 05/25/2019   Cervical disc disorder 05/25/2019   Falls 05/25/2019   General unsteadiness 05/25/2019   Encounter for well woman exam with routine gynecological exam 01/09/2019   Screening for colorectal cancer 01/09/2019   Encounter for screening colonoscopy 12/13/2017   Hyperparathyroidism, primary (HCC) 12/22/2016   Vitamin D deficiency 10/31/2015   Hyperuricemia 05/08/2013   ONYCHOMYCOSIS, TOENAILS 02/22/2009   Benign neoplasm of skin 02/22/2009   Diabetes mellitus (HCC) 01/25/2008   BACK PAIN WITH RADICULOPATHY 11/09/2007   Hyperlipidemia LDL goal <100 08/26/2007   Morbid obesity (HCC) 08/26/2007   Essential hypertension 08/26/2007   Obstructive sleep apnea 04/20/2007    PCP: Avon Gully  REFERRING PROVIDER: Vickki Hearing, MD  REFERRING  DIAG: C62.376 (ICD-10-CM) - Status post total left knee replacement M17.12 (ICD-10-CM) - Primary osteoarthritis of left knee  THERAPY DIAG:  Difficulty in walking, not elsewhere classified  Stiffness of left knee, not elsewhere classified  Chronic pain of left knee  Other abnormalities of gait and mobility  Weakness of left leg  Muscle weakness (generalized)  Rationale for Evaluation and Treatment: Rehabilitation  ONSET DATE: 05/2020  SUBJECTIVE:   SUBJECTIVE STATEMENT: Pt arrived tearful.  Reports she fell asleep and fell off the couch over  weekend.  Reports her right hip is a little sore.  Was able to get up by herself, did take a moment to get started.  Stated she has not ordered AFO yet.  Reports a decreased slapping on foot during gait.   Eval:  History of left TKA in 2022 per Dr Romeo Apple.  Larey Seat in late July off a step; walks in today with QC; sometimes that left knee will give way; retired; reports walks with QC at baseline; wall walks at home; reports some numbness left lateral knee to foot  PERTINENT HISTORY: S/p 05/2020 PAIN:  Are you having pain?no pain  PRECAUTIONS: Fall    WEIGHT BEARING RESTRICTIONS: No  FALLS:  Has patient fallen in last 6 months? Yes. Number of falls 4    OCCUPATION: retired  PLOF: Independent;   PATIENT GOALS: get rid of my cane; walk without my cane  NEXT MD VISIT: as needed; sees Romeo Apple yearly  OBJECTIVE:   DIAGNOSTIC FINDINGS: X-ray report left knee   Status post left knee   X-ray shows no fracture dislocation loosening or malalignment   There may be some patella Baha   Impression possible patella Baha normal appearing implant in terms of no loosening and no malalignment  PATIENT SURVEYS:  FOTO 50  COGNITION: Overall cognitive status: Within functional limits for tasks assessed     SENSATION: WFL  EDEMA:  None noted   POSTURE: rounded shoulders and forward head  PALPATION: No tenderness noted left knee  LOWER EXTREMITY ROM:  Active ROM Right eval Left eval  Hip flexion    Hip extension    Hip abduction    Hip adduction    Hip internal rotation    Hip external rotation    Knee flexion    Knee extension    Ankle dorsiflexion    Ankle plantarflexion    Ankle inversion    Ankle eversion    Lumbar flexion 80% available    Lumbar extension 15% available     (Blank rows = not tested)  LOWER EXTREMITY MMT:  MMT Right eval Left eval Right 10/09/22 Left 10/09/22 Right 10/28/22 Left 10/28/22  Hip flexion 5 4+   5 4+  Hip extension   3+ 3/5 5 5    Hip abduction   3+ 3/5 4+ 4+  Hip adduction        Hip internal rotation        Hip external rotation        Knee flexion        Knee extension 5 4+   5 5  Ankle dorsiflexion 5 4   5  4+  Ankle plantarflexion        Ankle inversion        Ankle eversion         (Blank rows = not tested)    FUNCTIONAL TESTS:  5 times sit to stand: 24.73 sec no UE assist  GAIT: Distance walked: 50 ft in clinic Assistive  device utilized: [employer] base (adjusted at eval to appropriate height) Level of assistance: Modified independence Comments: slight antalgic gait   TODAY'S TREATMENT:                                                                                                                              DATE:  11/18/22 Squat front of chair 2x 10 reps  4" lateral step ups x 10 each with theraband around left knee to decrease knee hyper extension Leg press 4 then 5Pl 2x 10 Hamstring machine 5 Pl 2x 10 Discussed AFO- pt has information but hasn't called 4" knee drive 2 Y78 with 1 HHA, cueing for hyperextension SLS 3" max BLE   11/16/22 10 STS controlled eccentric control with no HHA Squats front of chair, 2 setsx 10 reps; cueing for mechanics Heel raises on incline x 20 Toe raises on decline x 20  11/12/22 Heel raises on incline x 20 Toe raises on decline x 20 10 STS controlled eccentric control with no HHA Squats front of chair, 4 sets 5 reps; cueing for mechanics 4" box step ups x 10 each with theraband around left knee to decrease knee hyper extension 4" lateral step ups x 10 each with theraband around left knee to decrease knee hyper extension 4" step down 10x   11/10/22 Heel raises on incline x 20 Toe raises on decline x 20 4" box step ups x 10 each with theraband around left knee to decrease knee hyper extension 4" lateral step ups x 10 each with theraband around left knee to decrease knee hyper extension Marching 2 x 10 with 1 UE assist Sit to stand x 10 without UE  assist Nustep seat 8 x 5' lower extremity conditioning level 4     11/02/22 Nustep seat 8 x 5' dynamic warm up Instructions on ordering AFO (ok per message from Dr. Romeo Apple) Standing: Heel raises x 20 Hip vectors 2" hold x 5 each with 1 UE assist Sit to stand 2 x 10 4" Step ups with 1 HHA  x 10 each 4" lateral step ups with 2 HHA x 10 each   10/28/22 Progress note Nustep seat 8 x 5' dynamic warm up FOTO 58 MMT's see above 5 x sit to stand 20 sec no UE assist SLS right 5"; left 2"  10/26/22 Sit to stand 2x10 Marching 2x10 Forward step up with 1 HHA 4" 2X10 each LE Lateral step up with 1 HHA 4" 2X10 each LE Leg press 3pl 2x10  10/22/22 Sit to stand x 15 Squat x 15  Marching x 5 Knee flexion 3# x 15  Step up " x 10 B  Leg press 3pl x 10 Tandem stance with head turns x 5 B    10/20/22 Nustep seat 8 level 2 dynamic warm up x 5' 0 STS controlled eccentric control with no HHA Heel raise incline slope 2x 10 Toe raises decline slope 2x 10 Squat  10x front of chair Vector stance 3x 5" with 1 HHA Sidestep with GTB around thigh- cueing to reduce Lt LE ER 2RT inside // bars minimal HHA Forward step up 4in 10x with 2 HHA- cueing to reduce hyperextension and mechanics  AROM 0-120 degrees   10/15/22 Nustep seat 8 level 2 dynamic warm up x 5' Sitting: Hamstring stretch left 3 x 20" Standing: Lumbar extension x 10 over bar Heel raises 2 x 10 Toe raises 2 x 10 Slant board 5 x 20" 12" step left knee drives for flexion x 2' Hip vectors 3" hold x 5 each  PATIENT EDUCATION:  Education details: Updated HEP Person educated: Patient Education method: Explanation, Demonstration, and Handouts Education comprehension: verbalized understanding, returned demonstration, verbal cues required, and tactile cues required  HOME EXERCISE PROGRAM: Access Code: [email or web address] URL: [email or web address] 11/[email or web address]    10/28/22 SLS  10/15/22 hip vectors  10/14/22 - Seated Hamstring  Stretch  - 1-2 x daily - 7 x weekly - 3 reps - 30 hold - Seated Hip Adduction Isometrics with Ball  - 1-2 x daily - 7 x weekly - 2 sets - 10 reps - 6 hold  10/09/22: - Prone Hip Extension  - 2 x daily - 7 x weekly - 1 sets - 10 reps - 5" hold - Sidelying Hip Abduction  - 2 x daily - 7 x weekly - 1 sets - 10 reps - 5" hold - Supine Bridge  - 2 x daily - 7 x weekly - 1 sets - 10 reps - 5" hold  Date: 10/07/2022 Prepared by: [facility]  Exercises - Standing Lumbar Extension with Counter  - 5 x daily - 7 x weekly - 1 sets - 5-10 reps - Prone Press Up  - 2 x daily - 7 x weekly - 1 sets - 10 reps - Standing Single Leg Stance with Counter Support  - 2 x daily - 7 x weekly - 1 sets - 3 reps - 10 sec hold - Sit to Stand  - 2 x daily - 7 x weekly - 1 sets - 10 reps  ASSESSMENT:  CLINICAL IMPRESSION: Continued session focus with LE functional strengthening.  Pt continues to have difficulty with Lt knee locking with minimal changes.  Pt requires seated rest breaks due to fatigue.  Educated on benefits of beginning walking program to address activity tolerance.  Discussed benefits of AFO, encouraged to call and set up apt for fitting.  Eval:  Patient is a 67 y.o. female who was seen today for physical therapy evaluation and treatment for Z96.652 (ICD-10-CM) - Status post total left knee replacement M17.12 (ICD-10-CM) - Primary osteoarthritis of left knee. Patient demonstrates muscle weakness, reduced ROM, and fascial restrictions which are likely contributing to symptoms of pain and are negatively impacting patient ability to perform ADLs and functional mobility tasks. Patient will benefit from skilled physical therapy services to address these deficits to reduce pain and improve level of function with ADLs and functional mobility tasks.   OBJECTIVE IMPAIRMENTS: Abnormal gait, decreased activity tolerance, decreased balance, decreased endurance, decreased knowledge of condition, decreased mobility,  difficulty walking, decreased strength, increased fascial restrictions, and impaired perceived functional ability.   ACTIVITY LIMITATIONS: carrying, lifting, bending, sitting, standing, squatting, sleeping, and locomotion level  PARTICIPATION LIMITATIONS: meal prep, cleaning, laundry, shopping, community activity, and yard work  [facility] POTENTIAL: Good  CLINICAL DECISION MAKING: Stable/uncomplicated  EVALUATION COMPLEXITY: Low   GOALS: Goals reviewed with patient? No  SHORT TERM GOALS:  Target date: 10/21/2022 patient will be independent with initial HEP  Baseline: Goal status: met  2.  Patient will self report 30% improvement to improve tolerance for functional activity  Baseline:  Goal status: IN PROGRESS  LONG TERM GOALS: Target date: 11/04/2022  Patient will be independent in self management strategies to improve quality of life and functional outcomes.  Baseline:  Goal status: IN PROGRESS  2.  Patient will self report 50% improvement to improve tolerance for functional activity  Baseline:  Goal status: IN PROGRESS  3.  Patient will increase left leg MMTs to 5/5 without pain to promote return to ambulation community distances with minimal deviation.  Baseline: see above Goal status: IN PROGRESS  4.  Patient will remain free of falls Baseline: 10/28/22 no new falls Goal status: IN PROGRESS  5.  Patient will improve 5 times sit to stand score from 24.73 sec to 18 sec to demonstrate improved functional mobility and increased lower extremity strength.   Baseline: 10/28/22 20 sec Goal status: IN PROGRESS  6.  Patient will ambulate x 224 ft without AD and minimal gait deviation to improve safety with ambulating household distances. Baseline: ambulates with QC Goal status: IN PROGRESS  7. Patient will be able to stand SLS x 10" each to demonstrate improved functional balance  Baseline: 5" on right and 2" on left  Goal status: in progress   PLAN:  PT FREQUENCY:  2x/week  PT DURATION: 4 weeks  PLANNED INTERVENTIONS: Therapeutic exercises, Therapeutic activity, Neuromuscular re-education, Balance training, Gait training, Patient/Family education, Joint manipulation, Joint mobilization, Stair training, Orthotic/Fit training, DME instructions, Aquatic Therapy, Dry Needling, Electrical stimulation, Spinal manipulation, Spinal mobilization, Cryotherapy, Moist heat, Compression bandaging, scar mobilization, Splintting, Taping, Traction, Ultrasound, Ionotophoresis 4mg /ml Dexamethasone, and Manual therapy   PLAN FOR NEXT SESSION: Review goals next session. follow up with patient ordering AFO for left foot  Becky Sax, LPTA/CLT; CBIS [phone]   Juel Burrow, PTA 11/18/2022, 12:13 PM  12:13 PM, 11/18/22

## 2022-11-23 ENCOUNTER — Ambulatory Visit (HOSPITAL_COMMUNITY): Payer: Federal, State, Local not specified - PPO

## 2022-11-23 DIAGNOSIS — M25662 Stiffness of left knee, not elsewhere classified: Secondary | ICD-10-CM

## 2022-11-23 DIAGNOSIS — M6281 Muscle weakness (generalized): Secondary | ICD-10-CM

## 2022-11-23 DIAGNOSIS — R262 Difficulty in walking, not elsewhere classified: Secondary | ICD-10-CM | POA: Diagnosis not present

## 2022-11-23 DIAGNOSIS — R29898 Other symptoms and signs involving the musculoskeletal system: Secondary | ICD-10-CM

## 2022-11-23 DIAGNOSIS — R2689 Other abnormalities of gait and mobility: Secondary | ICD-10-CM

## 2022-11-23 DIAGNOSIS — G8929 Other chronic pain: Secondary | ICD-10-CM

## 2022-11-23 NOTE — Therapy (Signed)
OUTPATIENT PHYSICAL THERAPY LOWER EXTREMITY TREATMENT/PROGRESS NOTE/DISCHARGE NOTE Progress Note Reporting Period 10/07/2022 to 11/23/2022  See note below for Objective Data and Assessment of Progress/Goals.  PHYSICAL THERAPY DISCHARGE SUMMARY  Visits from Start of Care: 14  Current functional level related to goals / functional outcomes: See below   Remaining deficits: See below   Education / Equipment: HEP   Patient agrees to discharge. Patient goals were partially met. Patient is being discharged due to  progress plateau.         Patient Name: SERAYA JOBST MRN: [id number] DOB:1955-03-24, 67 y.o., female Today's Date: 11/23/2022  END OF SESSION:  PT End of Session - 11/23/22 1353     Visit Number 14    Number of Visits 19    Date for PT Re-Evaluation 11/25/22    Authorization Type [employer]    PT Start Time 1350    PT Stop Time 1420    PT Time Calculation (min) 30 min    Activity Tolerance Patient tolerated treatment well;Patient limited by fatigue    Behavior During Therapy WFL for tasks assessed/performed              Past Medical History:  Diagnosis Date   Anxiety    Back pain    Bronchitis    Diabetes mellitus    Dizziness    Heart murmur    History of gout    Hyperlipidemia    Hypertension    Neuropathy    Obesity    Obstructive sleep apnea    CPAP   Past Surgical History:  Procedure Laterality Date   ABDOMINAL HYSTERECTOMY     [facility]   ADENOIDECTOMY     CATARACT EXTRACTION Left    right 02/2018   COLONOSCOPY  2008   diminutive rectal polyp, s/p removal. polypoid mucosa   COLONOSCOPY WITH PROPOFOL N/A 01/03/2018   Procedure: COLONOSCOPY WITH PROPOFOL;  Surgeon: Corbin Ade, MD;  Location: [facility];  Service: Endoscopy;  Laterality: N/A;  12:00pm   DILATION AND CURETTAGE OF UTERUS     ENDOMETRIAL ABLATION     EYE SURGERY Bilateral 12/04/2013   cataract   PARATHYROIDECTOMY N/A 12/22/2016   Procedure: PARATHYROIDECTOMY, NECK  EXPLORATION;  Surgeon: Avel Peace, MD;  Location: [facility];  Service: General;  Laterality: N/A;   TONSILLECTOMY     TOTAL KNEE ARTHROPLASTY Left 05/28/2020   Procedure: LEFT TOTAL KNEE ARTHROPLASTY;  Surgeon: Vickki Hearing, MD;  Location: [facility];  Service: Orthopedics;  Laterality: Left;   WOUND EXPLORATION Left 05/31/2020   Procedure: WOUND EXPLORATION AND IRRIGATION LEFT KNEE;  Surgeon: Vickki Hearing, MD;  Location: [facility];  Service: Orthopedics;  Laterality: Left;  irrigation wound exploration, wound closure   Patient Active Problem List   Diagnosis Date Noted   S/P abdominal supracervical subtotal hysterectomy 10/08/2020   Encounter for screening fecal occult blood testing 10/08/2020   Encounter for gynecological examination with Papanicolaou smear of cervix 10/08/2020   Chronic pain of left knee 05/31/2020   Wound dehiscence, surgical    Status post total left knee replacement 05/28/2020   Primary osteoarthritis of left knee    Polyneuropathy due to secondary diabetes mellitus (HCC) 05/25/2019   Arthritis of right sacroiliac joint (HCC) 05/25/2019   Cervical disc disorder 05/25/2019   Falls 05/25/2019   General unsteadiness 05/25/2019   Encounter for well woman exam with routine gynecological exam 01/09/2019   Screening for colorectal cancer 01/09/2019   Encounter for screening colonoscopy 12/13/2017  Hyperparathyroidism, primary (HCC) 12/22/2016   Vitamin D deficiency 10/31/2015   Hyperuricemia 05/08/2013   ONYCHOMYCOSIS, TOENAILS 02/22/2009   Benign neoplasm of skin 02/22/2009   Diabetes mellitus (HCC) 01/25/2008   BACK PAIN WITH RADICULOPATHY 11/09/2007   Hyperlipidemia LDL goal <100 08/26/2007   Morbid obesity (HCC) 08/26/2007   Essential hypertension 08/26/2007   Obstructive sleep apnea 04/20/2007    PCP: Avon Gully  REFERRING PROVIDER: Vickki Hearing, MD  REFERRING DIAG: [phone] (ICD-10-CM) - Status post total left knee replacement M17.12  (ICD-10-CM) - Primary osteoarthritis of left knee  THERAPY DIAG:  Difficulty in walking, not elsewhere classified  Stiffness of left knee, not elsewhere classified  Chronic pain of left knee  Other abnormalities of gait and mobility  Weakness of left leg  Muscle weakness (generalized)  Rationale for Evaluation and Treatment: Rehabilitation  ONSET DATE: 05/2020  SUBJECTIVE:   SUBJECTIVE STATEMENT: Patient upset today; her aunt passed away and will be traveling up for funeral to the [place] area; has not ordered AFO.  Maybe "50%" stronger/better  Eval:  History of left TKA in 2022 per Dr Romeo Apple.  Larey Seat in late July off a step; walks in today with QC; sometimes that left knee will give way; retired; reports walks with QC at baseline; wall walks at home; reports some numbness left lateral knee to foot  PERTINENT HISTORY: S/p 05/2020 PAIN:  Are you having pain?no pain  PRECAUTIONS: Fall    WEIGHT BEARING RESTRICTIONS: No  FALLS:  Has patient fallen in last 6 months? Yes. Number of falls 4    OCCUPATION: retired  PLOF: Independent;   PATIENT GOALS: get rid of my cane; walk without my cane  NEXT MD VISIT: as needed; sees Romeo Apple yearly  OBJECTIVE:   DIAGNOSTIC FINDINGS: X-ray report left knee   Status post left knee   X-ray shows no fracture dislocation loosening or malalignment   There may be some patella Baha   Impression possible patella Baha normal appearing implant in terms of no loosening and no malalignment  PATIENT SURVEYS:  FOTO 50  COGNITION: Overall cognitive status: Within functional limits for tasks assessed     SENSATION: WFL  EDEMA:  None noted   POSTURE: rounded shoulders and forward head  PALPATION: No tenderness noted left knee  LOWER EXTREMITY ROM:  Active ROM Right eval Left eval  Hip flexion    Hip extension    Hip abduction    Hip adduction    Hip internal rotation    Hip external rotation    Knee flexion     Knee extension    Ankle dorsiflexion    Ankle plantarflexion    Ankle inversion    Ankle eversion    Lumbar flexion 80% available    Lumbar extension 15% available     (Blank rows = not tested)  LOWER EXTREMITY MMT:  MMT Right eval Left eval Right 10/09/22 Left 10/09/22 Right 10/28/22 Left 10/28/22  Hip flexion 5 4+   5 4+  Hip extension   3+ 3/5 5 5   Hip abduction   3+ 3/5 4+ 4+  Hip adduction        Hip internal rotation        Hip external rotation        Knee flexion        Knee extension 5 4+   5 5  Ankle dorsiflexion 5 4   5  4+  Ankle plantarflexion        Ankle inversion  Ankle eversion         (Blank rows = not tested)    FUNCTIONAL TESTS:  5 times sit to stand: 24.73 sec no UE assist  GAIT: Distance walked: 50 ft in clinic Assistive device utilized: Quad cane small base (adjusted at eval to appropriate height) Level of assistance: Modified independence Comments: slight antalgic gait   TODAY'S TREATMENT:                                                                                                                              DATE:  11/23/22 Nustep seat 8 x 5' dynamic warm up Progress note FOTO 54 MMT's see above 5 x sit to stand 19.63 sec no UE assist SLS right 6 "; left 8 "     11/18/22 Squat front of chair 2x 10 reps  4" lateral step ups x 10 each with theraband around left knee to decrease knee hyper extension Leg press 4 then 5Pl 2x 10 Hamstring machine 5 Pl 2x 10 Discussed AFO- pt has information but hasn't called 4" knee drive 2 Z61 with 1 HHA, cueing for hyperextension SLS 3" max BLE   11/16/22 10 STS controlled eccentric control with no HHA Squats front of chair, 2 setsx 10 reps; cueing for mechanics Heel raises on incline x 20 Toe raises on decline x 20  11/12/22 Heel raises on incline x 20 Toe raises on decline x 20 10 STS controlled eccentric control with no HHA Squats front of chair, 4 sets 5 reps; cueing for  mechanics 4" box step ups x 10 each with theraband around left knee to decrease knee hyper extension 4" lateral step ups x 10 each with theraband around left knee to decrease knee hyper extension 4" step down 10x   11/10/22 Heel raises on incline x 20 Toe raises on decline x 20 4" box step ups x 10 each with theraband around left knee to decrease knee hyper extension 4" lateral step ups x 10 each with theraband around left knee to decrease knee hyper extension Marching 2 x 10 with 1 UE assist Sit to stand x 10 without UE assist Nustep seat 8 x 5' lower extremity conditioning level 4     11/02/22 Nustep seat 8 x 5' dynamic warm up Instructions on ordering AFO (ok per message from Dr. Romeo Apple) Standing: Heel raises x 20 Hip vectors 2" hold x 5 each with 1 UE assist Sit to stand 2 x 10 4" Step ups with 1 HHA  x 10 each 4" lateral step ups with 2 HHA x 10 each   10/28/22 Progress note Nustep seat 8 x 5' dynamic warm up FOTO 58 MMT's see above 5 x sit to stand 20 sec no UE assist SLS right 5"; left 2"  10/26/22 Sit to stand 2x10 Marching 2x10 Forward step up with 1 HHA 4" 2X10 each LE Lateral step up with 1 HHA 4" 2X10 each LE Leg press  3pl 2x10  10/22/22 Sit to stand x 15 Squat x 15  Marching x 5 Knee flexion 3# x 15  Step up " x 10 B  Leg press 3pl x 10 Tandem stance with head turns x 5 B    10/20/22 Nustep seat 8 level 2 dynamic warm up x 5' 0 STS controlled eccentric control with no HHA Heel raise incline slope 2x 10 Toe raises decline slope 2x 10 Squat 10x front of chair Vector stance 3x 5" with 1 HHA Sidestep with GTB around thigh- cueing to reduce Lt LE ER 2RT inside // bars minimal HHA Forward step up 4in 10x with 2 HHA- cueing to reduce hyperextension and mechanics  AROM 0-120 degrees   10/15/22 Nustep seat 8 level 2 dynamic warm up x 5' Sitting: Hamstring stretch left 3 x 20" Standing: Lumbar extension x 10 over bar Heel raises 2 x 10 Toe raises  2 x 10 Slant board 5 x 20" 12" step left knee drives for flexion x 2' Hip vectors 3" hold x 5 each  PATIENT EDUCATION:  Education details: Updated HEP Person educated: Patient Education method: Explanation, Demonstration, and Handouts Education comprehension: verbalized understanding, returned demonstration, verbal cues required, and tactile cues required  HOME EXERCISE PROGRAM: Access Code: [email or web address] URL: [email or web address] 11/18/22    10/28/22 SLS  10/15/22 hip vectors  10/14/22 - Seated Hamstring Stretch  - 1-2 x daily - 7 x weekly - 3 reps - 30 hold - Seated Hip Adduction Isometrics with Ball  - 1-2 x daily - 7 x weekly - 2 sets - 10 reps - 6 hold  10/09/22: - Prone Hip Extension  - 2 x daily - 7 x weekly - 1 sets - 10 reps - 5" hold - Sidelying Hip Abduction  - 2 x daily - 7 x weekly - 1 sets - 10 reps - 5" hold - Supine Bridge  - 2 x daily - 7 x weekly - 1 sets - 10 reps - 5" hold  Date: 10/07/2022 Prepared by: [facility]  Exercises - Standing Lumbar Extension with Counter  - 5 x daily - 7 x weekly - 1 sets - 5-10 reps - Prone Press Up  - 2 x daily - 7 x weekly - 1 sets - 10 reps - Standing Single Leg Stance with Counter Support  - 2 x daily - 7 x weekly - 1 sets - 3 reps - 10 sec hold - Sit to Stand  - 2 x daily - 7 x weekly - 1 sets - 10 reps  ASSESSMENT:  CLINICAL IMPRESSION: Progress note today; discharge secondary to progress plateau.   Eval:  Patient is a 67 y.o. female who was seen today for physical therapy evaluation and treatment for Z96.652 (ICD-10-CM) - Status post total left knee replacement M17.12 (ICD-10-CM) - Primary osteoarthritis of left knee. Patient demonstrates muscle weakness, reduced ROM, and fascial restrictions which are likely contributing to symptoms of pain and are negatively impacting patient ability to perform ADLs and functional mobility tasks. Patient will benefit from skilled physical therapy services to address these  deficits to reduce pain and improve level of function with ADLs and functional mobility tasks.   OBJECTIVE IMPAIRMENTS: Abnormal gait, decreased activity tolerance, decreased balance, decreased endurance, decreased knowledge of condition, decreased mobility, difficulty walking, decreased strength, increased fascial restrictions, and impaired perceived functional ability.   ACTIVITY LIMITATIONS: carrying, lifting, bending, sitting, standing, squatting, sleeping, and locomotion level  PARTICIPATION LIMITATIONS: meal prep, cleaning, laundry,  shopping, community activity, and yard work  [facility] POTENTIAL: Good  CLINICAL DECISION MAKING: Stable/uncomplicated  EVALUATION COMPLEXITY: Low   GOALS: Goals reviewed with patient? No  SHORT TERM GOALS: Target date: 10/21/2022 patient will be independent with initial HEP  Baseline: Goal status: met  2.  Patient will self report 30% improvement to improve tolerance for functional activity  Baseline:  Goal status: met  LONG TERM GOALS: Target date: 11/04/2022  Patient will be independent in self management strategies to improve quality of life and functional outcomes.  Baseline:  Goal status: IN PROGRESS  2.  Patient will self report 50% improvement to improve tolerance for functional activity  Baseline:  Goal status: met  3.  Patient will increase left leg MMTs to 5/5 without pain to promote return to ambulation community distances with minimal deviation.  Baseline: see above Goal status: IN PROGRESS  4.  Patient will remain free of falls Baseline: 10/28/22  Goal status: IN PROGRESS  5.  Patient will improve 5 times sit to stand score from 24.73 sec to 18 sec to demonstrate improved functional mobility and increased lower extremity strength.   Baseline: 10/28/22 20 sec Goal status: IN PROGRESS  6.  Patient will ambulate x 224 ft without AD and minimal gait deviation to improve safety with ambulating household  distances. Baseline: ambulates with QC Goal status: IN PROGRESS  7. Patient will be able to stand SLS x 10" each to demonstrate improved functional balance  Baseline: 5" on right and 2" on left; right 6"; left 8"  Goal status: in progress   PLAN:  PT FREQUENCY: 2x/week  PT DURATION: 4 weeks  PLANNED INTERVENTIONS: Therapeutic exercises, Therapeutic activity, Neuromuscular re-education, Balance training, Gait training, Patient/Family education, Joint manipulation, Joint mobilization, Stair training, Orthotic/Fit training, DME instructions, Aquatic Therapy, Dry Needling, Electrical stimulation, Spinal manipulation, Spinal mobilization, Cryotherapy, Moist heat, Compression bandaging, scar mobilization, Splintting, Taping, Traction, Ultrasound, Ionotophoresis 4mg /ml Dexamethasone, and Manual therapy   PLAN FOR NEXT SESSION: discharge  2:23 PM, 11/23/22 Heather Streeper Small Kristofor Michalowski MPT [facility] [place] [phone] Ph:[phone]

## 2022-12-05 ENCOUNTER — Ambulatory Visit
Admission: EM | Admit: 2022-12-05 | Discharge: 2022-12-05 | Disposition: A | Discharge status: Home / Self Care | Payer: Federal, State, Local not specified - PPO | Attending: Internal Medicine | Admitting: Internal Medicine

## 2022-12-05 DIAGNOSIS — U071 COVID-19: Secondary | ICD-10-CM

## 2022-12-05 MED ORDER — MOLNUPIRAVIR EUA 200MG CAPSULE: 4.0000 | ORAL_CAPSULE | Freq: Two times a day (BID) | ORAL | 0 refills | Status: AC

## 2022-12-05 NOTE — ED Provider Notes (Signed)
[facility]    CSN: [id number] Arrival date & time: 12/05/22  1326      History   Chief Complaint Chief Complaint  Patient presents with   Covid Positive    HPI Pamela Stein is a 67 y.o. female.   Pamela Stein is a 67 y.o. female presenting for evaluation after she tested positive for COVID-19 today. She was recently around her care aid who became symptomatic and tested positive for COVID-19 yesterday.  She is asymptomatic currently and without cough, nasal congestion, fever, chills, dizziness, fatigue, body aches, or headache.  She would like antiviral to prevent severe disease.     Past Medical History:  Diagnosis Date   Anxiety    Back pain    Bronchitis    Diabetes mellitus    Dizziness    Heart murmur    History of gout    Hyperlipidemia    Hypertension    Neuropathy    Obesity    Obstructive sleep apnea    CPAP    Patient Active Problem List   Diagnosis Date Noted   S/P abdominal supracervical subtotal hysterectomy 10/08/2020   Encounter for screening fecal occult blood testing 10/08/2020   Encounter for gynecological examination with Papanicolaou smear of cervix 10/08/2020   Chronic pain of left knee 05/31/2020   Wound dehiscence, surgical    Status post total left knee replacement 05/28/2020   Primary osteoarthritis of left knee    Polyneuropathy due to secondary diabetes mellitus (HCC) 05/25/2019   Arthritis of right sacroiliac joint (HCC) 05/25/2019   Cervical disc disorder 05/25/2019   Falls 05/25/2019   General unsteadiness 05/25/2019   Encounter for well woman exam with routine gynecological exam 01/09/2019   Screening for colorectal cancer 01/09/2019   Encounter for screening colonoscopy 12/13/2017   Hyperparathyroidism, primary (HCC) 12/22/2016   Vitamin D deficiency 10/31/2015   Hyperuricemia 05/08/2013   ONYCHOMYCOSIS, TOENAILS 02/22/2009   Benign neoplasm of skin 02/22/2009   Diabetes mellitus (HCC) 01/25/2008    BACK PAIN WITH RADICULOPATHY 11/09/2007   Hyperlipidemia LDL goal <100 08/26/2007   Morbid obesity (HCC) 08/26/2007   Essential hypertension 08/26/2007   Obstructive sleep apnea 04/20/2007    Past Surgical History:  Procedure Laterality Date   ABDOMINAL HYSTERECTOMY     [facility]   ADENOIDECTOMY     CATARACT EXTRACTION Left    right 02/2018   COLONOSCOPY  2008   diminutive rectal polyp, s/p removal. polypoid mucosa   COLONOSCOPY WITH PROPOFOL N/A 01/03/2018   Procedure: COLONOSCOPY WITH PROPOFOL;  Surgeon: Corbin Ade, MD;  [facility]: [facility];  Service: Endoscopy;  Laterality: N/A;  12:00pm   DILATION AND CURETTAGE OF UTERUS     ENDOMETRIAL ABLATION     EYE SURGERY Bilateral 12/04/2013   cataract   PARATHYROIDECTOMY N/A 12/22/2016   Procedure: PARATHYROIDECTOMY, NECK EXPLORATION;  Surgeon: Avel Peace, MD;  Location: [facility];  Service: General;  Laterality: N/A;   TONSILLECTOMY     TOTAL KNEE ARTHROPLASTY Left 05/28/2020   Procedure: LEFT TOTAL KNEE ARTHROPLASTY;  Surgeon: Vickki Hearing, MD;  Location: [facility];  Service: Orthopedics;  Laterality: Left;   WOUND EXPLORATION Left 05/31/2020   Procedure: WOUND EXPLORATION AND IRRIGATION LEFT KNEE;  Surgeon: Vickki Hearing, MD;  Location: [facility];  Service: Orthopedics;  Laterality: Left;  irrigation wound exploration, wound closure    OB History     Gravida  1   Para      Term  Preterm      AB  1   Living         SAB  1   IAB      Ectopic      Multiple      Live Births               Home Medications    Prior to Admission medications   Medication Sig Start Date End Date Taking? Authorizing Provider  molnupiravir EUA (LAGEVRIO) 200 mg CAPS capsule Take 4 capsules (800 mg total) by mouth 2 (two) times daily for 5 days. 12/05/22 12/10/22 Yes Loa Idler, Donavan Burnet, FNP  albuterol (PROVENTIL HFA;VENTOLIN HFA) 108 (90 Base) MCG/ACT inhaler Inhale 1-2 puffs into the lungs every 6 (six)  hours as needed for wheezing or shortness of breath. 05/20/16   Coralyn Mark, NP  amLODipine (NORVASC) 10 MG tablet Take 10 mg by mouth daily. 05/06/22   [provider]  aspirin 81 MG chewable tablet Chew 1 tablet (81 mg total) by mouth 2 (two) times daily. 05/30/20   Vickki Hearing, MD  furosemide (LASIX) 20 MG tablet Take 20 mg by mouth daily.  09/06/17   [provider]  gabapentin (NEURONTIN) 300 MG capsule Take 1 capsule (300 mg total) by mouth 3 (three) times daily. 10/29/15   Kerri Perches, MD  losartan (COZAAR) 50 MG tablet Take 50 mg by mouth daily. 04/09/22   [provider]  meloxicam (MOBIC) 7.5 MG tablet TAKE 1 TABLET(7.5 MG) BY MOUTH TWICE DAILY 01/20/21   Vickki Hearing, MD  metFORMIN (GLUCOPHAGE) 500 MG tablet TAKE 1 TABLET BY MOUTH TWICE DAILY WITH A MEAL Patient taking differently: Take 500 mg by mouth daily with lunch. 11/01/15   Kerri Perches, MD  montelukast (SINGULAIR) 10 MG tablet Take 10 mg by mouth at bedtime. 04/09/22   [provider]  pravastatin (PRAVACHOL) 80 MG tablet TAKE 1 TABLET(80 MG) BY MOUTH EVERY EVENING Patient taking differently: Take 80 mg by mouth every evening. 11/06/16   Kerri Perches, MD  spironolactone (ALDACTONE) 25 MG tablet Take 25 mg by mouth daily.  09/06/17   [provider]  Vitamin D, Ergocalciferol, (DRISDOL) 1.25 MG (50000 UNIT) CAPS capsule Take 50,000 Units by mouth every Sunday. 03/26/20   [provider]    Family History Family History  Problem Relation Age of Onset   Cancer Father        throat   Throat cancer Father    Hypertension Father    Diabetes Father    Diabetes Maternal Grandmother    Hypertension Maternal Grandfather    Hypertension Paternal Grandmother    Diabetes Paternal Grandfather    Hypertension Mother    Diabetes Mother    Cancer Paternal Aunt    Colon cancer Neg Hx    Colon polyps Neg Hx     Social History Social History    Tobacco Use   Smoking status: Never   Smokeless tobacco: Never  Vaping Use   Vaping status: Never Used  Substance Use Topics   Alcohol use: No    Alcohol/week: 0.0 standard drinks of alcohol   Drug use: No     Allergies   Ace inhibitors   Review of Systems Review of Systems Per HPI  Physical Exam Triage Vital Signs ED Triage Vitals  Encounter Vitals Group     BP 12/05/22 1406 (!) 150/80     Systolic BP Percentile --  Diastolic BP Percentile --      Pulse Rate 12/05/22 1406 (!) 103     Resp 12/05/22 1406 16     Temp 12/05/22 1406 97.6 F (36.4 C)     Temp Source 12/05/22 1405 Oral     SpO2 12/05/22 1406 95 %     Weight --      Height --      Head Circumference --      Peak Flow --      Pain Score 12/05/22 1407 0     Pain Loc --      Pain Education --      Exclude from Growth Chart --    No data found.  Updated Vital Signs BP (!) 150/80 (BP Location: Right Arm)   Pulse (!) 103   Temp 97.6 F (36.4 C) (Oral)   Resp 16   SpO2 95%   Visual Acuity Right Eye Distance:   Left Eye Distance:   Bilateral Distance:    Right Eye Near:   Left Eye Near:    Bilateral Near:     Physical Exam Vitals and nursing note reviewed.  Constitutional:      Appearance: She is not ill-appearing or toxic-appearing.  HENT:     Head: Normocephalic and atraumatic.     Right Ear: Hearing and external ear normal.     Left Ear: Hearing and external ear normal.     Nose: Nose normal.     Mouth/Throat:     Lips: Pink.  Eyes:     General: Lids are normal. Vision grossly intact. Gaze aligned appropriately.     Extraocular Movements: Extraocular movements intact.     Conjunctiva/sclera: Conjunctivae normal.  Cardiovascular:     Rate and Rhythm: Normal rate and regular rhythm.     Heart sounds: Normal heart sounds, S1 normal and S2 normal.  Pulmonary:     Effort: Pulmonary effort is normal. No respiratory distress.     Breath sounds: Normal breath sounds and air entry.   Musculoskeletal:     Cervical back: Neck supple.  Skin:    General: Skin is warm and dry.     Capillary Refill: Capillary refill takes less than 2 seconds.     Findings: No rash.  Neurological:     General: No focal deficit present.     Mental Status: She is alert and oriented to person, place, and time. Mental status is at baseline.     Cranial Nerves: No dysarthria or facial asymmetry.  Psychiatric:        Mood and Affect: Mood normal.        Speech: Speech normal.        Behavior: Behavior normal.        Thought Content: Thought content normal.        Judgment: Judgment normal.      UC Treatments / Results  Labs (all labs ordered are listed, but only abnormal results are displayed) Labs Reviewed - No data to display  EKG   Radiology No results found.  Procedures Procedures (including critical care time)  Medications Ordered in UC Medications - No data to display  Initial Impression / Assessment and Plan / UC Course  I have reviewed the triage vital signs and the nursing notes.  Pertinent labs & imaging results that were available during my care of the patient were reviewed by me and considered in my medical decision making (see chart for details).   1.  COVID-19  Molnupiravir as prescribed. She is not a candidate for Paxlovid as I reviewed her most recent BMP showing reduced renal function in 2022.  There are no up-to-date recent BMPs on file to see if this has improved. Advised to wash hands frequently, CDC guidelines discussed regarding masking.  She is agreeable with plan.  Counseled patient on potential for adverse effects with medications prescribed/recommended today, strict ER and return-to-clinic precautions discussed, patient verbalized understanding.    Final Clinical Impressions(s) / UC Diagnoses   Final diagnoses:  COVID-19     Discharge Instructions      You have COVID-19 viral illness. Wear a mask for 5 days of symptoms. You may return to  public within those 5 days as long as you do not have a fever. Wash hands frequently.  Take molnupiravir as prescribed. Start medicine tonight.   If you develop any new or worsening symptoms or if your symptoms do not start to improve, please return here or follow-up with your primary care provider. If your symptoms are severe, please go to the emergency room.     ED Prescriptions     Medication Sig Dispense Auth. Provider   molnupiravir EUA (LAGEVRIO) 200 mg CAPS capsule Take 4 capsules (800 mg total) by mouth 2 (two) times daily for 5 days. 40 capsule Carlisle Beers, FNP      PDMP not reviewed this encounter.   Carlisle Beers, [place] 12/05/22 1446

## 2022-12-05 NOTE — Discharge Instructions (Signed)
You have COVID-19 viral illness. Wear a mask for 5 days of symptoms. You may return to public within those 5 days as long as you do not have a fever. Wash hands frequently.  Take molnupiravir as prescribed. Start medicine tonight.   If you develop any new or worsening symptoms or if your symptoms do not start to improve, please return here or follow-up with your primary care provider. If your symptoms are severe, please go to the emergency room.

## 2022-12-05 NOTE — ED Triage Notes (Signed)
Pt reports she tested positive for Covid today after an exposure from her care aide.    Denies sxs

## 2022-12-08 ENCOUNTER — Telehealth: Payer: Self-pay

## 2022-12-08 NOTE — Telephone Encounter (Signed)
Pt called with questions about medications, stating that she was told she would be okay go to out by Wednesday of this week but that pharmacy gave her 40 capsules, and she had only been taking 2 capsules a day I inform patient she is actually supposed to take 4-200 mg capsules totaling in 800 mg twice a day 800 mg in the morning and 800 mg at night. Pt verbalized understanding.

## 2022-12-29 ENCOUNTER — Other Ambulatory Visit (HOSPITAL_COMMUNITY): Payer: Self-pay | Admitting: Gerontology

## 2022-12-29 ENCOUNTER — Other Ambulatory Visit (HOSPITAL_COMMUNITY): Payer: Self-pay

## 2022-12-29 DIAGNOSIS — R937 Abnormal findings on diagnostic imaging of other parts of musculoskeletal system: Secondary | ICD-10-CM

## 2022-12-29 DIAGNOSIS — Z780 Asymptomatic menopausal state: Secondary | ICD-10-CM

## 2023-01-04 ENCOUNTER — Other Ambulatory Visit (HOSPITAL_COMMUNITY)
Admission: RE | Admit: 2023-01-04 | Discharge: 2023-01-04 | Disposition: A | Discharge status: Home / Self Care | Payer: Federal, State, Local not specified - PPO | Source: Ambulatory Visit | Attending: Gerontology | Admitting: Gerontology

## 2023-01-04 ENCOUNTER — Ambulatory Visit (HOSPITAL_COMMUNITY)
Admission: RE | Admit: 2023-01-04 | Discharge: 2023-01-04 | Disposition: A | Discharge status: Home / Self Care | Payer: Federal, State, Local not specified - PPO | Source: Ambulatory Visit | Attending: Gerontology | Admitting: Gerontology

## 2023-01-04 DIAGNOSIS — Z780 Asymptomatic menopausal state: Secondary | ICD-10-CM | POA: Diagnosis present

## 2023-01-04 DIAGNOSIS — Z13820 Encounter for screening for osteoporosis: Secondary | ICD-10-CM | POA: Diagnosis present

## 2023-01-04 DIAGNOSIS — E785 Hyperlipidemia, unspecified: Secondary | ICD-10-CM | POA: Diagnosis not present

## 2023-01-04 DIAGNOSIS — E119 Type 2 diabetes mellitus without complications: Secondary | ICD-10-CM | POA: Diagnosis not present

## 2023-01-04 DIAGNOSIS — I10 Essential (primary) hypertension: Secondary | ICD-10-CM | POA: Insufficient documentation

## 2023-01-04 DIAGNOSIS — M85851 Other specified disorders of bone density and structure, right thigh: Secondary | ICD-10-CM | POA: Insufficient documentation

## 2023-01-04 LAB — HEMOGLOBIN A1C
Hgb A1c MFr Bld: 8 % — ABNORMAL HIGH (ref 4.8–5.6)
Mean Plasma Glucose: 182.9 mg/dL

## 2023-01-04 LAB — HEPATIC FUNCTION PANEL
ALT: 18 U/L (ref 0–44)
AST: 18 U/L (ref 15–41)
Albumin: 4.3 g/dL (ref 3.5–5.0)
Alkaline Phosphatase: 93 U/L (ref 38–126)
Bilirubin, Direct: 0.1 mg/dL (ref 0.0–0.2)
Indirect Bilirubin: 0.5 mg/dL (ref 0.3–0.9)
Total Bilirubin: 0.6 mg/dL (ref <1.2)
Total Protein: 8.8 g/dL — ABNORMAL HIGH (ref 6.5–8.1)

## 2023-01-04 LAB — CBC WITH DIFFERENTIAL/PLATELET
Abs Immature Granulocytes: 0.05 10*3/uL (ref 0.00–0.07)
Basophils Absolute: 0.1 10*3/uL (ref 0.0–0.1)
Basophils Relative: 1 %
Eosinophils Absolute: 0.2 10*3/uL (ref 0.0–0.5)
Eosinophils Relative: 2 %
HCT: 43.8 % (ref 36.0–46.0)
Hemoglobin: 13.4 g/dL (ref 12.0–15.0)
Immature Granulocytes: 1 %
Lymphocytes Relative: 33 %
Lymphs Abs: 2.9 10*3/uL (ref 0.7–4.0)
MCH: 21.4 pg — ABNORMAL LOW (ref 26.0–34.0)
MCHC: 30.6 g/dL (ref 30.0–36.0)
MCV: 70 fL — ABNORMAL LOW (ref 80.0–100.0)
Monocytes Absolute: 0.7 10*3/uL (ref 0.1–1.0)
Monocytes Relative: 8 %
Neutro Abs: 5 10*3/uL (ref 1.7–7.7)
Neutrophils Relative %: 55 %
Platelets: 349 10*3/uL (ref 150–400)
RBC: 6.26 MIL/uL — ABNORMAL HIGH (ref 3.87–5.11)
RDW: 18.9 % — ABNORMAL HIGH (ref 11.5–15.5)
WBC: 9 10*3/uL (ref 4.0–10.5)
nRBC: 0 % (ref 0.0–0.2)

## 2023-01-04 LAB — BASIC METABOLIC PANEL
Anion gap: 9 (ref 5–15)
BUN: 23 mg/dL (ref 8–23)
CO2: 24 mmol/L (ref 22–32)
Calcium: 10.6 mg/dL — ABNORMAL HIGH (ref 8.9–10.3)
Chloride: 102 mmol/L (ref 98–111)
Creatinine, Ser: 1.29 mg/dL — ABNORMAL HIGH (ref 0.44–1.00)
GFR, Estimated: 45 mL/min — ABNORMAL LOW (ref >60)
Glucose, Bld: 151 mg/dL — ABNORMAL HIGH (ref 70–99)
Potassium: 4.7 mmol/L (ref 3.5–5.1)
Sodium: 135 mmol/L (ref 135–145)

## 2023-01-04 LAB — LIPID PANEL
Cholesterol: 206 mg/dL — ABNORMAL HIGH (ref 0–200)
HDL: 44 mg/dL (ref >40)
LDL Cholesterol: 132 mg/dL — ABNORMAL HIGH (ref 0–99)
Total CHOL/HDL Ratio: 4.7 {ratio}
Triglycerides: 150 mg/dL — ABNORMAL HIGH (ref <150)
VLDL: 30 mg/dL (ref 0–40)

## 2023-01-05 LAB — MICROALBUMIN / CREATININE URINE RATIO
Creatinine, Urine: 158.7 mg/dL
Microalb Creat Ratio: 57 mg/g{creat} — ABNORMAL HIGH (ref 0–29)
Microalb, Ur: 90.2 ug/mL — ABNORMAL HIGH

## 2023-05-12 ENCOUNTER — Other Ambulatory Visit (HOSPITAL_COMMUNITY): Payer: Self-pay | Admitting: Internal Medicine

## 2023-05-12 DIAGNOSIS — Z1231 Encounter for screening mammogram for malignant neoplasm of breast: Secondary | ICD-10-CM

## 2023-05-18 ENCOUNTER — Encounter: Payer: Self-pay | Admitting: Emergency Medicine

## 2023-05-18 ENCOUNTER — Ambulatory Visit (INDEPENDENT_AMBULATORY_CARE_PROVIDER_SITE_OTHER)

## 2023-05-18 ENCOUNTER — Ambulatory Visit
Admission: EM | Admit: 2023-05-18 | Discharge: 2023-05-18 | Disposition: A | Discharge status: Home / Self Care | Attending: Nurse Practitioner | Admitting: Nurse Practitioner

## 2023-05-18 DIAGNOSIS — R051 Acute cough: Secondary | ICD-10-CM

## 2023-05-18 DIAGNOSIS — J209 Acute bronchitis, unspecified: Secondary | ICD-10-CM | POA: Diagnosis not present

## 2023-05-18 MED ORDER — BENZONATATE 100 MG PO CAPS: 100.0000 mg | ORAL_CAPSULE | Freq: Three times a day (TID) | ORAL | 0 refills | Status: AC | PRN

## 2023-05-18 MED ORDER — IPRATROPIUM-ALBUTEROL 0.5-2.5 (3) MG/3ML IN SOLN
3.0000 mL | Freq: Once | RESPIRATORY_TRACT | Status: AC
  Administered 2023-05-18: 3 mL via RESPIRATORY_TRACT

## 2023-05-18 MED ORDER — ALBUTEROL SULFATE HFA 108 (90 BASE) MCG/ACT IN AERS: 1.0000 | INHALATION_SPRAY | Freq: Four times a day (QID) | RESPIRATORY_TRACT | 0 refills | Status: AC | PRN

## 2023-05-18 NOTE — ED Provider Notes (Signed)
 [facility]    CSN: [id number] Arrival date & time: 05/18/23  1002      History   Chief Complaint No chief complaint on file.   HPI Pamela Stein is a 68 y.o. female.   Patient presents today with 5 day history of congested cough, shortness of breath and wheezing that is worse at nighttime when she lays down, chest pain when she coughs, stuffy nose, sore throat, sinus pressure and headache, bilateral intermittent ear pain, decreased appetite, loss of taste/smell, and fatigue.  No fever, body aches or chills, abdominal pain, nausea/vomiting, or diarrhea.  Has taken over-the-counter Mucinex and diabetic [place] with minimal temporary improvement.  Also used albuterol last night which did not really seem to help with symptoms.  She reports history of pneumonia and bronchitis in the past.  Patient is concerned for RSV.     Past Medical History:  Diagnosis Date   Anxiety    Back pain    Bronchitis    Diabetes mellitus    Dizziness    Heart murmur    History of gout    Hyperlipidemia    Hypertension    Neuropathy    Obesity    Obstructive sleep apnea    CPAP    Patient Active Problem List   Diagnosis Date Noted   S/P abdominal supracervical subtotal hysterectomy 10/08/2020   Encounter for screening fecal occult blood testing 10/08/2020   Encounter for gynecological examination with Papanicolaou smear of cervix 10/08/2020   Chronic pain of left knee 05/31/2020   Wound dehiscence, surgical    Status post total left knee replacement 05/28/2020   Primary osteoarthritis of left knee    Polyneuropathy due to secondary diabetes mellitus (HCC) 05/25/2019   Arthritis of right sacroiliac joint (HCC) 05/25/2019   Cervical disc disorder 05/25/2019   Falls 05/25/2019   General unsteadiness 05/25/2019   Encounter for well woman exam with routine gynecological exam 01/09/2019   Screening for colorectal cancer 01/09/2019   Encounter for screening colonoscopy 12/13/2017    Hyperparathyroidism, primary (HCC) 12/22/2016   Vitamin D deficiency 10/31/2015   Hyperuricemia 05/08/2013   ONYCHOMYCOSIS, TOENAILS 02/22/2009   Benign neoplasm of skin 02/22/2009   Diabetes mellitus (HCC) 01/25/2008   BACK PAIN WITH RADICULOPATHY 11/09/2007   Hyperlipidemia LDL goal <100 08/26/2007   Morbid obesity (HCC) 08/26/2007   Essential hypertension 08/26/2007   Obstructive sleep apnea 04/20/2007    Past Surgical History:  Procedure Laterality Date   ABDOMINAL HYSTERECTOMY     [facility]   ADENOIDECTOMY     CATARACT EXTRACTION Left    right 02/2018   COLONOSCOPY  2008   diminutive rectal polyp, s/p removal. polypoid mucosa   COLONOSCOPY WITH PROPOFOL N/A 01/03/2018   Procedure: COLONOSCOPY WITH PROPOFOL;  Surgeon: Corbin Ade, MD;  [facility]: [facility];  Service: Endoscopy;  Laterality: N/A;  12:00pm   DILATION AND CURETTAGE OF UTERUS     ENDOMETRIAL ABLATION     EYE SURGERY Bilateral 12/04/2013   cataract   PARATHYROIDECTOMY N/A 12/22/2016   Procedure: PARATHYROIDECTOMY, NECK EXPLORATION;  Surgeon: Avel Peace, MD;  Location: [facility];  Service: General;  Laterality: N/A;   TONSILLECTOMY     TOTAL KNEE ARTHROPLASTY Left 05/28/2020   Procedure: LEFT TOTAL KNEE ARTHROPLASTY;  Surgeon: Vickki Hearing, MD;  Location: [facility];  Service: Orthopedics;  Laterality: Left;   WOUND EXPLORATION Left 05/31/2020   Procedure: WOUND EXPLORATION AND IRRIGATION LEFT KNEE;  Surgeon: Vickki Hearing, MD;  Location: [facility];  Service: Orthopedics;  Laterality: Left;  irrigation wound exploration, wound closure    OB History     Gravida  1   Para      Term      Preterm      AB  1   Living         SAB  1   IAB      Ectopic      Multiple      Live Births               Home Medications    Prior to Admission medications   Medication Sig Start Date End Date Taking? Authorizing Provider  benzonatate (TESSALON) 100 MG capsule Take 1 capsule (100 mg  total) by mouth 3 (three) times daily as needed for cough. Do not take with alcohol or while operating or driving heavy machinery 0/4/54  Yes Cathlean Marseilles A, NP  albuterol (VENTOLIN HFA) 108 (90 Base) MCG/ACT inhaler Inhale 1-2 puffs into the lungs every 6 (six) hours as needed for wheezing or shortness of breath. 05/18/23   Valentino Nose, NP  amLODipine (NORVASC) 10 MG tablet Take 10 mg by mouth daily. 05/06/22   [provider]  aspirin 81 MG chewable tablet Chew 1 tablet (81 mg total) by mouth 2 (two) times daily. 05/30/20   Vickki Hearing, MD  gabapentin (NEURONTIN) 300 MG capsule Take 1 capsule (300 mg total) by mouth 3 (three) times daily. 10/29/15   Kerri Perches, MD  losartan (COZAAR) 50 MG tablet Take 50 mg by mouth daily. 04/09/22   [provider]  meloxicam (MOBIC) 7.5 MG tablet TAKE 1 TABLET(7.5 MG) BY MOUTH TWICE DAILY 01/20/21   Vickki Hearing, MD  metFORMIN (GLUCOPHAGE) 500 MG tablet TAKE 1 TABLET BY MOUTH TWICE DAILY WITH A MEAL Patient taking differently: Take 500 mg by mouth daily with lunch. 11/01/15   Kerri Perches, MD  montelukast (SINGULAIR) 10 MG tablet Take 10 mg by mouth at bedtime. 04/09/22   [provider]  spironolactone (ALDACTONE) 25 MG tablet Take 25 mg by mouth daily.  09/06/17   [provider]  Vitamin D, Ergocalciferol, (DRISDOL) 1.25 MG (50000 UNIT) CAPS capsule Take 50,000 Units by mouth every Sunday. 03/26/20   [provider]    Family History Family History  Problem Relation Age of Onset   Cancer Father        throat   Throat cancer Father    Hypertension Father    Diabetes Father    Diabetes Maternal Grandmother    Hypertension Maternal Grandfather    Hypertension Paternal Grandmother    Diabetes Paternal Grandfather    Hypertension Mother    Diabetes Mother    Cancer Paternal Aunt    Colon cancer Neg Hx    Colon polyps Neg Hx     Social History Social History   Tobacco  Use   Smoking status: Never   Smokeless tobacco: Never  Vaping Use   Vaping status: Never Used  Substance Use Topics   Alcohol use: No    Alcohol/week: 0.0 standard drinks of alcohol   Drug use: No     Allergies   Ace inhibitors   Review of Systems Review of Systems Per HPI  Physical Exam Triage Vital Signs ED Triage Vitals  Encounter Vitals Group     BP 05/18/23 1013 128/74     Systolic BP Percentile --  Diastolic BP Percentile --      Pulse Rate 05/18/23 1013 84     Resp 05/18/23 1013 18     Temp 05/18/23 1013 97.7 F (36.5 C)     Temp Source 05/18/23 1013 Oral     SpO2 05/18/23 1013 98 %     Weight --      Height --      Head Circumference --      Peak Flow --      Pain Score 05/18/23 1015 0     Pain Loc --      Pain Education --      Exclude from Growth Chart --    No data found.  Updated Vital Signs BP 128/74 (BP Location: Right Arm)   Pulse 84   Temp 97.7 F (36.5 C) (Oral)   Resp 18   SpO2 (S) 98% Comment: after douneb  Visual Acuity Right Eye Distance:   Left Eye Distance:   Bilateral Distance:    Right Eye Near:   Left Eye Near:    Bilateral Near:     Physical Exam Vitals and nursing note reviewed.  Constitutional:      General: She is not in acute distress.    Appearance: Normal appearance. She is not ill-appearing or toxic-appearing.  HENT:     Head: Normocephalic and atraumatic.     Right Ear: Tympanic membrane, ear canal and external ear normal.     Left Ear: Tympanic membrane, ear canal and external ear normal.     Nose: No congestion or rhinorrhea.     Mouth/Throat:     Mouth: Mucous membranes are moist.     Pharynx: Oropharynx is clear. Posterior oropharyngeal erythema present. No oropharyngeal exudate.  Eyes:     General: No scleral icterus.    Extraocular Movements: Extraocular movements intact.  Cardiovascular:     Rate and Rhythm: Normal rate and regular rhythm.  Pulmonary:     Effort: Pulmonary effort is normal.  No respiratory distress.     Breath sounds: Wheezing present. No rhonchi or rales.  Musculoskeletal:     Cervical back: Normal range of motion and neck supple.  Lymphadenopathy:     Cervical: No cervical adenopathy.  Skin:    General: Skin is warm and dry.     Coloration: Skin is not jaundiced or pale.     Findings: No erythema or rash.  Neurological:     Mental Status: She is alert and oriented to person, place, and time.  Psychiatric:        Behavior: Behavior is cooperative.      UC Treatments / Results  Labs (all labs ordered are listed, but only abnormal results are displayed) Labs Reviewed - No data to display  EKG   Radiology DG Chest 2 View Result Date: 05/18/2023 CLINICAL DATA:  Cough for 5 days. EXAM: CHEST - 2 VIEW COMPARISON:  July 11, 2021. FINDINGS: The heart size and mediastinal contours are within normal limits. Both lungs are clear. The visualized skeletal structures are unremarkable. IMPRESSION: No active cardiopulmonary disease. Electronically Signed   By: Lupita Raider M.D.   On: 05/18/2023 11:25    Procedures Procedures (including critical care time)  Medications Ordered in UC Medications  ipratropium-albuterol (DUONEB) 0.5-2.5 (3) MG/3ML nebulizer solution 3 mL (3 mLs Nebulization Given 05/18/23 1048)    Initial Impression / Assessment and Plan / UC Course  I have reviewed the triage vital signs and the nursing notes.  Pertinent labs & imaging results that were available during my care of the patient were reviewed by me and considered in my medical decision making (see chart for details).   Patient is well-appearing, normotensive, afebrile, not tachycardic, not tachypneic, oxygenating well on room air.    1. Acute cough 2. Acute bronchitis, unspecified organism Vitals and examination are reassuring today Suspect viral etiology, however given length of symptoms and shared patient decision making, viral testing deferred  Chest xray pending at time  of discharge, upon my independent review there was no acute cardiopulmonary process Duoneb was given in urgent care which improved symptoms subjectively and wheezing fully resolved after treatment Start scheduled bronchodilator x 48 hours, then as needed Other supportive care discussed including continue diabetic testing and over-the-counter guaifenesin Cough suppressant medication prescribed for the patient today Return and ER precautions discussed  Update: Chest x-ray is negative for acute cardiopulmonary process.  No change to treatment plan at this time notified via MyChart.  The patient was given the opportunity to ask questions.  All questions answered to their satisfaction.  The patient is in agreement to this plan.   Final Clinical Impressions(s) / UC Diagnoses   Final diagnoses:  Acute cough  Acute bronchitis, unspecified organism     Discharge Instructions      You have a viral upper respiratory infection that is causing inflammation in your lungs called bronchitis.  We gave you a breathing treatment today to help open up your airway.  Continue the albuterol inhaler at home every 6 hours scheduled for the next 2 days, then use as needed.    Symptoms should improve over the next week to 10 days.  If you develop chest pain or shortness of breath, go to the emergency room.  I will contact you later today if the chest x-ray shows pneumonia.  Some things that can make you feel better are: - Increased rest - Increasing fluid with water/sugar free electrolytes - Acetaminophen as needed for fever/pain - Salt water gargling, chloraseptic spray and throat lozenges for sore throat - OTC guaifenesin (Mucinex) 600 mg twice daily for congestion - Saline sinus flushes or a neti pot - Humidifying the air -Tessalon Perles every 8 hours as needed for dry cough      ED Prescriptions     Medication Sig Dispense Auth. Provider   albuterol (VENTOLIN HFA) 108 (90 Base) MCG/ACT inhaler  Inhale 1-2 puffs into the lungs every 6 (six) hours as needed for wheezing or shortness of breath. 1 each Valentino Nose, NP   benzonatate (TESSALON) 100 MG capsule Take 1 capsule (100 mg total) by mouth 3 (three) times daily as needed for cough. Do not take with alcohol or while operating or driving heavy machinery 21 capsule Valentino Nose, NP      PDMP not reviewed this encounter.   Valentino Nose, NP 05/18/23 1143

## 2023-05-18 NOTE — ED Triage Notes (Signed)
 Nasal congestion, wheezing, ringing in ears and cough since Thursday.  Has taken mucinex with some relief.

## 2023-05-18 NOTE — Discharge Instructions (Signed)
 You have a viral upper respiratory infection that is causing inflammation in your lungs called bronchitis.  We gave you a breathing treatment today to help open up your airway.  Continue the albuterol inhaler at home every 6 hours scheduled for the next 2 days, then use as needed.    Symptoms should improve over the next week to 10 days.  If you develop chest pain or shortness of breath, go to the emergency room.  I will contact you later today if the chest x-ray shows pneumonia.  Some things that can make you feel better are: - Increased rest - Increasing fluid with water/sugar free electrolytes - Acetaminophen as needed for fever/pain - Salt water gargling, chloraseptic spray and throat lozenges for sore throat - OTC guaifenesin (Mucinex) 600 mg twice daily for congestion - Saline sinus flushes or a neti pot - Humidifying the air -Tessalon Perles every 8 hours as needed for dry cough

## 2023-05-24 ENCOUNTER — Ambulatory Visit: Payer: Federal, State, Local not specified - PPO | Admitting: Orthopedic Surgery

## 2023-05-31 ENCOUNTER — Ambulatory Visit: Admitting: Orthopedic Surgery

## 2023-05-31 ENCOUNTER — Other Ambulatory Visit (INDEPENDENT_AMBULATORY_CARE_PROVIDER_SITE_OTHER): Payer: Self-pay

## 2023-05-31 DIAGNOSIS — Z96652 Presence of left artificial knee joint: Secondary | ICD-10-CM | POA: Diagnosis not present

## 2023-05-31 DIAGNOSIS — M1712 Unilateral primary osteoarthritis, left knee: Secondary | ICD-10-CM

## 2023-05-31 DIAGNOSIS — R2681 Unsteadiness on feet: Secondary | ICD-10-CM | POA: Diagnosis not present

## 2023-05-31 NOTE — Progress Notes (Signed)
 Chief Complaint  Patient presents with   Post-op Follow-up    Left knee replacement     68 year old female 3 years after left total knee with attune fixed-bearing posterior stabilized knee.  Patient's pain has been completely relieved but she says she has difficulty with balance walking.  She says she is just not walking like she wants to and cannot walk without her cane  We did Romberg test and within 3 to 4 seconds she lost balance  She thinks her left leg is shorter than the right so we did a block test and she needs 1/4 inch heel lift on the left  Her knee is absolutely stable in flexion and extension she has excellent range of motion 0 to 120 degrees  We did a angled test and there was no flexion instability  Encounter Diagnoses  Name Primary?   S/P total knee replacement, left 05/28/20 Yes   Unilateral primary osteoarthritis, left knee    Unsteady gait    DG Knee AP/LAT W/Sunrise Left Result Date: 05/31/2023 X-ray report Chief complaint LEFT TKA 2022ATTUNE PS FB Images 3 Reading: SIZING LOOKS APPROPRIATE ALINGNMENT -3 TO 3+ WEAR NONE NOTICEABLE Impression: NORMAL POST OP KNEE 3 YRS OUT    Assessment and plan patient seems to have a balance issue recommend balance training with therapy  Recommend heel lift for the heel/leg length discrepancy  X-ray knee again in a year

## 2023-05-31 NOTE — Progress Notes (Signed)
   There were no vitals taken for this visit.  There is no height or weight on file to calculate BMI.  Chief Complaint  Patient presents with   Post-op Follow-up    Left knee replacement     Encounter Diagnosis  Name Primary?   S/P total knee replacement, left 05/28/20 Yes    DOI/DOS/ Date: 05/28/20  No pain but can't walk like she wants to has problems with walking

## 2023-05-31 NOTE — Patient Instructions (Signed)
 Physical therapy has been ordered for you at [facility]. They should call you to schedule, [phone] is the phone number to call, if you want to call to schedule.

## 2023-06-21 ENCOUNTER — Encounter (HOSPITAL_COMMUNITY): Payer: Self-pay

## 2023-06-28 ENCOUNTER — Ambulatory Visit (HOSPITAL_COMMUNITY)

## 2023-06-29 ENCOUNTER — Ambulatory Visit (HOSPITAL_COMMUNITY)

## 2023-06-30 ENCOUNTER — Ambulatory Visit (HOSPITAL_COMMUNITY)
Admission: RE | Admit: 2023-06-30 | Discharge: 2023-06-30 | Disposition: A | Discharge status: Home / Self Care | Source: Ambulatory Visit | Attending: Internal Medicine | Admitting: Internal Medicine

## 2023-06-30 DIAGNOSIS — Z1231 Encounter for screening mammogram for malignant neoplasm of breast: Secondary | ICD-10-CM | POA: Insufficient documentation

## 2023-07-16 ENCOUNTER — Other Ambulatory Visit (HOSPITAL_COMMUNITY)
Admission: RE | Admit: 2023-07-16 | Discharge: 2023-07-16 | Disposition: A | Discharge status: Home / Self Care | Source: Ambulatory Visit | Attending: Internal Medicine | Admitting: Internal Medicine

## 2023-07-16 DIAGNOSIS — E118 Type 2 diabetes mellitus with unspecified complications: Secondary | ICD-10-CM | POA: Insufficient documentation

## 2023-07-16 DIAGNOSIS — Z0001 Encounter for general adult medical examination with abnormal findings: Secondary | ICD-10-CM | POA: Diagnosis present

## 2023-07-16 DIAGNOSIS — E785 Hyperlipidemia, unspecified: Secondary | ICD-10-CM | POA: Insufficient documentation

## 2023-07-16 DIAGNOSIS — I10 Essential (primary) hypertension: Secondary | ICD-10-CM | POA: Insufficient documentation

## 2023-07-16 LAB — BASIC METABOLIC PANEL WITH GFR
Anion gap: 9 (ref 5–15)
BUN: 16 mg/dL (ref 8–23)
CO2: 21 mmol/L — ABNORMAL LOW (ref 22–32)
Calcium: 10 mg/dL (ref 8.9–10.3)
Chloride: 106 mmol/L (ref 98–111)
Creatinine, Ser: 1.11 mg/dL — ABNORMAL HIGH (ref 0.44–1.00)
GFR, Estimated: 54 mL/min — ABNORMAL LOW (ref >60)
Glucose, Bld: 255 mg/dL — ABNORMAL HIGH (ref 70–99)
Potassium: 4 mmol/L (ref 3.5–5.1)
Sodium: 136 mmol/L (ref 135–145)

## 2023-07-16 LAB — HEMOGLOBIN A1C
Hgb A1c MFr Bld: 7.7 % — ABNORMAL HIGH (ref 4.8–5.6)
Mean Plasma Glucose: 174.29 mg/dL

## 2023-08-06 ENCOUNTER — Other Ambulatory Visit: Payer: Self-pay

## 2023-08-06 ENCOUNTER — Ambulatory Visit (HOSPITAL_COMMUNITY): Attending: Orthopedic Surgery

## 2023-08-06 ENCOUNTER — Encounter (HOSPITAL_COMMUNITY): Payer: Self-pay

## 2023-08-06 DIAGNOSIS — M1712 Unilateral primary osteoarthritis, left knee: Secondary | ICD-10-CM | POA: Diagnosis not present

## 2023-08-06 DIAGNOSIS — R2681 Unsteadiness on feet: Secondary | ICD-10-CM | POA: Diagnosis not present

## 2023-08-06 DIAGNOSIS — Z96652 Presence of left artificial knee joint: Secondary | ICD-10-CM | POA: Insufficient documentation

## 2023-08-06 DIAGNOSIS — M25662 Stiffness of left knee, not elsewhere classified: Secondary | ICD-10-CM | POA: Insufficient documentation

## 2023-08-06 DIAGNOSIS — R262 Difficulty in walking, not elsewhere classified: Secondary | ICD-10-CM | POA: Diagnosis present

## 2023-08-06 DIAGNOSIS — R2689 Other abnormalities of gait and mobility: Secondary | ICD-10-CM | POA: Diagnosis present

## 2023-08-06 NOTE — Therapy (Signed)
 OUTPATIENT PHYSICAL THERAPY LOWER EXTREMITY EVALUATION   Patient Name: Pamela Stein MRN: [id number] DOB:1955/02/23, 68 y.o., female Today's Date: 08/06/2023  END OF SESSION:  PT End of Session - 08/06/23 1340     Visit Number 1    Date for PT Re-Evaluation 09/10/23    Authorization Type [employer] PPO    Authorization Time Period seeking auth    Authorization - Visit Number 0    Progress Note Due on Visit 10    PT Start Time 1301    PT Stop Time 1340    PT Time Calculation (min) 39 min    Activity Tolerance Patient tolerated treatment well    Behavior During Therapy WFL for tasks assessed/performed          Past Medical History:  Diagnosis Date   Anxiety    Back pain    Bronchitis    Diabetes mellitus    Dizziness    Heart murmur    History of gout    Hyperlipidemia    Hypertension    Neuropathy    Obesity    Obstructive sleep apnea    CPAP   Past Surgical History:  Procedure Laterality Date   ABDOMINAL HYSTERECTOMY     [facility]   ADENOIDECTOMY     CATARACT EXTRACTION Left    right 02/2018   COLONOSCOPY  2008   diminutive rectal polyp, s/p removal. polypoid mucosa   COLONOSCOPY WITH PROPOFOL  N/A 01/03/2018   Procedure: COLONOSCOPY WITH PROPOFOL ;  Surgeon: Shaaron Lamar HERO, MD;  Location: [facility];  Service: Endoscopy;  Laterality: N/A;  12:00pm   DILATION AND CURETTAGE OF UTERUS     ENDOMETRIAL ABLATION     EYE SURGERY Bilateral 12/04/2013   cataract   PARATHYROIDECTOMY N/A 12/22/2016   Procedure: PARATHYROIDECTOMY, NECK EXPLORATION;  Surgeon: Lily Boas, MD;  Location: [facility];  Service: General;  Laterality: N/A;   TONSILLECTOMY     TOTAL KNEE ARTHROPLASTY Left 05/28/2020   Procedure: LEFT TOTAL KNEE ARTHROPLASTY;  Surgeon: Margrette Taft BRAVO, MD;  Location: [facility];  Service: Orthopedics;  Laterality: Left;   WOUND EXPLORATION Left 05/31/2020   Procedure: WOUND EXPLORATION AND IRRIGATION LEFT KNEE;  Surgeon: Margrette Taft BRAVO, MD;   Location: [facility];  Service: Orthopedics;  Laterality: Left;  irrigation wound exploration, wound closure   Patient Active Problem List   Diagnosis Date Noted   S/P abdominal supracervical subtotal hysterectomy 10/08/2020   Encounter for screening fecal occult blood testing 10/08/2020   Encounter for gynecological examination with Papanicolaou smear of cervix 10/08/2020   Chronic pain of left knee 05/31/2020   Wound dehiscence, surgical    Status post total left knee replacement 05/28/2020   Primary osteoarthritis of left knee    Polyneuropathy due to secondary diabetes mellitus (HCC) 05/25/2019   Arthritis of right sacroiliac joint (HCC) 05/25/2019   Cervical disc disorder 05/25/2019   Falls 05/25/2019   General unsteadiness 05/25/2019   Encounter for well woman exam with routine gynecological exam 01/09/2019   Screening for colorectal cancer 01/09/2019   Encounter for screening colonoscopy 12/13/2017   Hyperparathyroidism, primary (HCC) 12/22/2016   Vitamin D  deficiency 10/31/2015   Hyperuricemia 05/08/2013   ONYCHOMYCOSIS, TOENAILS 02/22/2009   Benign neoplasm of skin 02/22/2009   Diabetes mellitus (HCC) 01/25/2008   BACK PAIN WITH RADICULOPATHY 11/09/2007   Hyperlipidemia LDL goal <100 08/26/2007   Morbid obesity (HCC) 08/26/2007   Essential hypertension 08/26/2007   Obstructive sleep apnea 04/20/2007    PCP:  Carlette Benita Area, MD   REFERRING PROVIDER: Margrette Taft BRAVO, MD  REFERRING DIAG: [phone] (ICD-10-CM) - S/P total knee replacement, left M17.12 (ICD-10-CM) - Unilateral primary osteoarthritis, left knee R26.81 (ICD-10-CM) - Unsteady gait  THERAPY DIAG:  Difficulty in walking, not elsewhere classified  Stiffness of left knee, not elsewhere classified  Other abnormalities of gait and mobility  Rationale for Evaluation and Treatment: Rehabilitation  ONSET DATE: L TKA 2022  SUBJECTIVE:   SUBJECTIVE STATEMENT: Pt states she feel her being mature in  years has caused continued problems with left knee. Pt states walking continued to be really really slow. Pt states she has been using quad cane since surgery. Pt states the knee is not painful, just not cooperative. Pt states she can feel  PERTINENT HISTORY: Diabetes, controlled by meds HTN, medicated PAIN:  Are you having pain? No  PRECAUTIONS: Fall  RED FLAGS: None   WEIGHT BEARING RESTRICTIONS: No  FALLS:  Has patient fallen in last 6 months? 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  DIAGNOSTIC FINDINGS: X-ray report  Chief complaint LEFT TKA 2022ATTUNE PS FB  Images 3  Reading: SIZING LOOKS APPROPRIATE  ALINGNMENT -3 TO 3+ WEAR NONE NOTICEABLE   Impression: NORMAL POST OP KNEE 3 YRS OUT   PATIENT SURVEYS:  LEFS : 37/80   COGNITION: Overall cognitive status: Within functional limits for tasks assessed     SENSATION: WFL, pt states she has some neuropathy in LLE but not as bad as in head   PALPATION: No tenderness to palpation  LOWER EXTREMITY ROM:  Active ROM Right eval Left eval  Hip flexion    Hip extension    Hip abduction    Hip adduction    Hip internal rotation    Hip external rotation    Knee flexion 120 118, no pain  Knee extension    Ankle dorsiflexion    Ankle plantarflexion    Ankle inversion    Ankle eversion     (Blank rows = not tested)  LOWER EXTREMITY MMT:  MMT Right eval Left eval  Hip flexion 4 3+  Hip extension 4 3+  Hip abduction 4 3  Hip adduction 4 3  Hip  internal rotation    Hip external rotation    Knee flexion 4 3+  Knee extension 4 3+  Ankle dorsiflexion 4 3  Ankle plantarflexion    Ankle inversion    Ankle eversion     (Blank rows = not tested)   FUNCTIONAL TESTS:  5 times sit to stand: 23.61 seconds 2 minute walk test: 188 feet  GAIT: Distance walked: 280 feet Assistive device utilized: Quad cane small base Level of assistance: Modified independence Comments: pt demonstrates decreased left foot clearance about every 4th step, decreased left step length especially coming to the end of .  TREATMENT DATE:  08/06/2023   Evaluation: -ROM measured, Strength assessed, HEP prescribed, pt educated on prognosis, findings, and importance of HEP compliance if given.   PATIENT EDUCATION:  Education details: Pt was educated on findings of PT evaluation, prognosis, frequency of therapy visits and rationale, attendance policy, and HEP if given.   Person educated: Patient Education method: Explanation, Verbal cues, and Handouts Education comprehension: verbalized understanding, verbal cues required, and needs further education  HOME EXERCISE PROGRAM: Access Code: [email or web address] URL: [email or web address] Date: 08/06/2023 Prepared by: Lang Ada  Exercises - Supine Bridge  - 1 x daily - 7 x weekly - 3 sets - 10 reps - Sit to Stand with Arms Crossed  - 1 x daily - 7 x weekly - 3 sets - 10 reps  ASSESSMENT:  CLINICAL IMPRESSION: Patient is a 68 y.o. female who was seen today for physical therapy evaluation and treatment for Z96.652 (ICD-10-CM) - S/P total knee replacement, left M17.12 (ICD-10-CM) - Unilateral primary osteoarthritis, left knee R26.81 (ICD-10-CM) - Unsteady gait.   Patient demonstrates decreased LE strength, decreased functional mobility, and impaired balance. Patient also  demonstrates difficulty with ambulation during today's session with decreased endurance, stride length and velocity noted. Patient also demonstrates WFL ROM of left knee compared to right. Patient requires increased time for STS transfer and ambulation. General LE strengthening and endurance training indicated due to left knee instability reported and decreased strength observed throughout LLE. Patient would benefit from skilled physical therapy for increased endurance with ambulation, increased LLE strength, and balance for improved gait quality, return to higher level of function with ADLs, and progress towards therapy goals.   OBJECTIVE IMPAIRMENTS: Abnormal gait, decreased activity tolerance, decreased balance, decreased endurance, decreased mobility, difficulty walking, decreased ROM, decreased strength, and obesity.   ACTIVITY LIMITATIONS: carrying, lifting, bending, standing, squatting, stairs, transfers, bed mobility, bathing, and locomotion level  PARTICIPATION LIMITATIONS: meal prep, cleaning, laundry, shopping, community activity, and yard work  PERSONAL FACTORS: Age, Fitness, Time since onset of injury/illness/exacerbation, and 1-2 comorbidities: HTN, Diabetes are also affecting patient's functional outcome.   REHAB POTENTIAL: Fair chronic in nature  CLINICAL DECISION MAKING: Stable/uncomplicated  EVALUATION COMPLEXITY: Low   GOALS: Goals reviewed with patient? No  SHORT TERM GOALS: Target date: 08/20/23  Patient will demonstrate evidence of independence with individualized HEP and will report compliance for at least 3 days per week for optimized progression towards remaining therapy goals. Baseline:  Goal status: INITIAL  2.  Patient will report a decrease knee buckling episodes to less than 5 times per week for improved functional mobility and decrease fall risk.  Baseline: several times Goal status: INITIAL     LONG TERM GOALS: Target date: 09/10/23  Pt will  demonstrate a an increase of at least 9 points on the LEFS for improved performance of community ambulation and ADL. Baseline: see objective Goal status: INITIAL  2.  Pt will improve 2 MWT by 50 feet in order to demonstrate improved functional ambulatory capacity in community setting.  Baseline:  Goal status: INITIAL   4.  Pt will demonstrate at least 4/5 MMT for L lower extremity for increased strength during ADL and community ambulation. Baseline:  Goal status: INITIAL  5.  Pt will improve 5TSTS by at least 2.3 seconds in order to improve functional strength during functional transfers. Baseline:  Goal status: INITIAL    PLAN:  PT FREQUENCY: 2x/week  PT DURATION: other: 5 weeks  PLANNED INTERVENTIONS: 97110-Therapeutic exercises, 97530- Therapeutic activity, [id number]- Neuromuscular re-education, 97535-  Self Care, [id number]- Manual therapy, [phone]- Gait training, Patient/Family education, Balance training, Stair training, Joint mobilization, and DME instructions  PLAN FOR NEXT SESSION: review goals and HEP, progress LE, assess balance formally, progress endurance training   Lang Ada, PT, DPT [facility] Office: [phone] 2:32 PM, 08/06/23   Managed Medicaid Authorization Request  Visit Dx Codes: M73.7; F74.337; R26.89  Functional Tool Score: [employer] PPO   For all possible CPT codes, reference the Planned Interventions line above.     Check all conditions that are expected to impact treatment: {Conditions expected to impact treatment:Diabetes mellitus, Complications related to surgery, and Unknown   If treatment provided at initial evaluation, no treatment charged due to lack of authorization.

## 2023-08-11 ENCOUNTER — Ambulatory Visit (HOSPITAL_COMMUNITY): Attending: Orthopedic Surgery

## 2023-08-11 DIAGNOSIS — M25662 Stiffness of left knee, not elsewhere classified: Secondary | ICD-10-CM | POA: Diagnosis present

## 2023-08-11 DIAGNOSIS — G8929 Other chronic pain: Secondary | ICD-10-CM | POA: Insufficient documentation

## 2023-08-11 DIAGNOSIS — M25562 Pain in left knee: Secondary | ICD-10-CM | POA: Insufficient documentation

## 2023-08-11 DIAGNOSIS — R262 Difficulty in walking, not elsewhere classified: Secondary | ICD-10-CM | POA: Insufficient documentation

## 2023-08-11 DIAGNOSIS — R2689 Other abnormalities of gait and mobility: Secondary | ICD-10-CM | POA: Diagnosis present

## 2023-08-11 NOTE — Therapy (Signed)
 OUTPATIENT PHYSICAL THERAPY LOWER EXTREMITY EVALUATION   Patient Name: Pamela Stein MRN: [id number] DOB:Jun 20, 1955, 68 y.o., female Today's Date: 08/11/2023  END OF SESSION:  PT End of Session - 08/11/23 1440     Visit Number 2    Number of Visits 10    Date for PT Re-Evaluation 09/10/23    Authorization Type BC[employer] PPO    Authorization Time Period seeking auth    Progress Note Due on Visit 10    PT Start Time 1435    PT Stop Time 1515    PT Time Calculation (min) 40 min    Activity Tolerance Patient tolerated treatment well    Behavior During Therapy WFL for tasks assessed/performed           Past Medical History:  Diagnosis Date   Anxiety    Back pain    Bronchitis    Diabetes mellitus    Dizziness    Heart murmur    History of gout    Hyperlipidemia    Hypertension    Neuropathy    Obesity    Obstructive sleep apnea    CPAP   Past Surgical History:  Procedure Laterality Date   ABDOMINAL HYSTERECTOMY     [facility]   ADENOIDECTOMY     CATARACT EXTRACTION Left    right 02/2018   COLONOSCOPY  2008   diminutive rectal polyp, s/p removal. polypoid mucosa   COLONOSCOPY WITH PROPOFOL  N/A 01/03/2018   Procedure: COLONOSCOPY WITH PROPOFOL ;  Surgeon: Shaaron Lamar HERO, MD;  [facility];  Service: Endoscopy;  Laterality: N/A;  12:00pm   DILATION AND CURETTAGE OF UTERUS     ENDOMETRIAL ABLATION     EYE SURGERY Bilateral 12/04/2013   cataract   PARATHYROIDECTOMY N/A 12/22/2016   Procedure: PARATHYROIDECTOMY, NECK EXPLORATION;  Surgeon: Lily Boas, MD;  Location: [facility];  Service: General;  Laterality: N/A;   TONSILLECTOMY     TOTAL KNEE ARTHROPLASTY Left 05/28/2020   Procedure: LEFT TOTAL KNEE ARTHROPLASTY;  Surgeon: Margrette Taft BRAVO, MD;  Location: [facility];  Service: Orthopedics;  Laterality: Left;   WOUND EXPLORATION Left 05/31/2020   Procedure: WOUND EXPLORATION AND IRRIGATION LEFT KNEE;  Surgeon: Margrette Taft BRAVO, MD;  Location: [facility];  Service: Orthopedics;  Laterality: Left;  irrigation wound exploration, wound closure   Patient Active Problem List   Diagnosis Date Noted   S/P abdominal supracervical subtotal hysterectomy 10/08/2020   Encounter for screening fecal occult blood testing 10/08/2020   Encounter for gynecological examination with Papanicolaou smear of cervix 10/08/2020   Chronic pain of left knee 05/31/2020   Wound dehiscence, surgical    Status post total left knee replacement 05/28/2020   Primary osteoarthritis of left knee    Polyneuropathy due to secondary diabetes mellitus (HCC) 05/25/2019   Arthritis of right sacroiliac joint (HCC) 05/25/2019   Cervical disc disorder 05/25/2019   Falls 05/25/2019   General unsteadiness 05/25/2019   Encounter for well woman exam with routine gynecological exam 01/09/2019   Screening for colorectal cancer 01/09/2019   Encounter for screening colonoscopy 12/13/2017   Hyperparathyroidism, primary (HCC) 12/22/2016   Vitamin D  deficiency 10/31/2015   Hyperuricemia 05/08/2013   ONYCHOMYCOSIS, TOENAILS 02/22/2009   Benign neoplasm of skin 02/22/2009   Diabetes mellitus (HCC) 01/25/2008   BACK PAIN WITH RADICULOPATHY 11/09/2007   Hyperlipidemia LDL goal <100 08/26/2007   Morbid obesity (HCC) 08/26/2007   Essential hypertension 08/26/2007   Obstructive sleep apnea 04/20/2007    PCP:  Carlette Benita Area, MD   REFERRING PROVIDER: Margrette Taft BRAVO, MD  REFERRING DIAG: [phone] (ICD-10-CM) - S/P total knee replacement, left M17.12 (ICD-10-CM) - Unilateral primary osteoarthritis, left knee R26.81 (ICD-10-CM) - Unsteady gait  THERAPY DIAG:  Difficulty in walking, not elsewhere classified  Stiffness of left knee, not elsewhere classified  Other abnormalities of gait and mobility  Rationale for Evaluation and Treatment: Rehabilitation  ONSET DATE: L TKA 2022  SUBJECTIVE:   SUBJECTIVE STATEMENT: Patient was out of town through the weekend to sing at  a cousins funeral; has not had time to do her exercises.  Feels like her left leg is shorter than her right.    PERTINENT HISTORY: Diabetes, controlled by meds HTN, medicated PAIN:  Are you having pain? No  PRECAUTIONS: Fall  RED FLAGS: None   WEIGHT BEARING RESTRICTIONS: No  FALLS:  Has patient fallen in last 6 months? 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  DIAGNOSTIC FINDINGS: X-ray report  Chief complaint LEFT TKA 2022ATTUNE PS FB  Images 3  Reading: SIZING LOOKS APPROPRIATE  ALINGNMENT -3 TO 3+ WEAR NONE NOTICEABLE   Impression: NORMAL POST OP KNEE 3 YRS OUT   PATIENT SURVEYS:  LEFS : 37/80   COGNITION: Overall cognitive status: Within functional limits for tasks assessed     SENSATION: WFL, pt states she has some neuropathy in LLE but not as bad as in head   PALPATION: No tenderness to palpation  LOWER EXTREMITY ROM:  Active ROM Right eval Left eval  Hip flexion    Hip extension    Hip abduction    Hip adduction    Hip internal rotation    Hip external rotation    Knee flexion 120 118, no pain  Knee extension    Ankle dorsiflexion    Ankle plantarflexion    Ankle inversion    Ankle eversion     (Blank rows = not tested)  LOWER EXTREMITY MMT:  MMT Right eval Left eval  Hip flexion 4 3+  Hip extension 4 3+  Hip abduction 4 3  Hip adduction 4 3  Hip internal rotation    Hip external rotation    Knee flexion 4 3+  Knee extension 4 3+  Ankle dorsiflexion 4 3  Ankle  plantarflexion    Ankle inversion    Ankle eversion     (Blank rows = not tested)   FUNCTIONAL TESTS:  5 times sit to stand: 23.61 seconds 2 minute walk test: 188 feet  GAIT: Distance walked: 280 feet Assistive device utilized: Quad cane small base Level of assistance: Modified independence Comments: pt demonstrates decreased left foot clearance about every 4th step, decreased left step length especially coming to the end of .  TREATMENT DATE:  08/11/23 Nustep seat 8 x 5' dynamic warm up Review of HEP and goals Discussion of use of AFO (she states has one does not wear it) Sit to stand 2 x 10 no UE assist SLS 4 on left and 8 on right Tandem stance x 30 each side with intermittent hand assist Supine: Bridge 3 hold 2 x 10 SLR x 10    08/06/2023   Evaluation: -ROM measured, Strength assessed, HEP prescribed, pt educated on prognosis, findings, and importance of HEP compliance if given.   PATIENT EDUCATION:  Education details: Pt was educated on findings of PT evaluation, prognosis, frequency of therapy visits and rationale, attendance policy, and HEP if given.   Person educated: Patient Education method: Explanation, Verbal cues, and Handouts Education comprehension: verbalized understanding, verbal cues required, and needs further education  HOME EXERCISE PROGRAM: 08/11/23 - tandem stance balance; try not to hold on  - 2 x daily - 7 x weekly - 1 sets - 5 reps - 30 sec hold - Clamshell  - 2 x daily - 7 x weekly - 2 sets - 10 reps  Access Code: [email or web address] URL: [email or web address] Date: 08/06/2023 Prepared by: Lang Ada  Exercises - Supine Bridge  - 1 x daily - 7 x weekly - 3 sets - 10 reps - Sit to Stand with Arms Crossed  - 1 x daily - 7 x weekly - 3 sets - 10 reps  ASSESSMENT:  CLINICAL IMPRESSION: Today's session  starts with review of HEP and goals; patient verbalizes agreement with set rehab goals.  Needs cues for controlled descent with sit to stand initial set but improved second set.  Decreased balance noted by SLS testing today; left less time than right.  Noted left knee hyperextension with weight bearing activity; standing and walking. Patient tends to adduct left hip with SLR so added clam to address weak hip abductors. Updated HEP.  Patient continues with drop foot slap foot gait left; discussed with her possibly asking her MD for a nerve conduction test.    Patient will benefit from continued skilled therapy services to address deficits and promote return to optimal function.      Eval:Patient is a 68 y.o. female who was seen today for physical therapy evaluation and treatment for Z96.652 (ICD-10-CM) - S/P total knee replacement, left M17.12 (ICD-10-CM) - Unilateral primary osteoarthritis, left knee R26.81 (ICD-10-CM) - Unsteady gait.   Patient demonstrates decreased LE strength, decreased functional mobility, and impaired balance. Patient also demonstrates difficulty with ambulation during today's session with decreased endurance, stride length and velocity noted. Patient also demonstrates WFL ROM of left knee compared to right. Patient requires increased time for STS transfer and ambulation. General LE strengthening and endurance training indicated due to left knee instability reported and decreased strength observed throughout LLE. Patient would benefit from skilled physical therapy for increased endurance with ambulation, increased LLE strength, and balance for improved gait quality, return to higher level of function with ADLs, and progress towards therapy goals.   OBJECTIVE IMPAIRMENTS: Abnormal gait, decreased activity tolerance, decreased balance, decreased endurance, decreased mobility, difficulty walking, decreased ROM, decreased strength, and obesity.   ACTIVITY LIMITATIONS: carrying,  lifting, bending, standing, squatting, stairs, transfers, bed mobility, bathing, and locomotion level  PARTICIPATION LIMITATIONS: meal prep, cleaning, laundry, shopping, community activity, and yard work  PERSONAL FACTORS: Age, Fitness, Time since onset of injury/illness/exacerbation, and 1-2 comorbidities: HTN, Diabetes are also affecting patient's functional outcome.   REHAB POTENTIAL: Fair chronic in nature  CLINICAL DECISION MAKING: Stable/uncomplicated  EVALUATION COMPLEXITY: Low   GOALS: Goals reviewed with patient? No  SHORT TERM GOALS: Target date: 08/20/23  Patient will demonstrate evidence of independence with individualized HEP and will report compliance for at least 3 days per week for optimized progression towards remaining therapy goals. Baseline:  Goal status: IN PROGRESS  2.  Patient will report a decrease knee buckling episodes to less than 5 times per week for improved functional mobility and decrease fall risk.  Baseline: several times Goal status: IN PROGRESS     LONG TERM GOALS: Target date: 09/10/23  Pt will demonstrate a an increase of at least 9 points on the LEFS for improved performance of community ambulation and ADL. Baseline: see objective Goal status: IN PROGRESS  2.  Pt will improve 2 MWT by 50 feet in order to demonstrate improved functional ambulatory capacity in community setting.  Baseline:  Goal status: IN PROGRESS   4.  Pt will demonstrate at least 4/5 MMT for L lower extremity for increased strength during ADL and community ambulation. Baseline:  Goal status: IN PROGRESS  5.  Pt will improve 5TSTS by at least 2.3 seconds in order to improve functional strength during functional transfers. Baseline:  Goal status: IN PROGRESS    PLAN:  PT FREQUENCY: 2x/week  PT DURATION: other: 5 weeks  PLANNED INTERVENTIONS: 97110-Therapeutic exercises, 97530- Therapeutic activity, 97112- Neuromuscular re-education, [phone]- Self Care, [id number]-  Manual therapy, [phone]- Gait training, Patient/Family education, Balance training, Stair training, Joint mobilization, and DME instructions  PLAN FOR NEXT SESSION:  progress LE, assess balance formally, progress endurance training   3:15 PM, 08/11/23 Pamela Stein MPT [facility] [place] [phone] Ph:[phone]    Managed Medicaid Authorization Request  Visit Dx Codes: R26.2; F74.337; R26.89  Functional Tool Score: BCBS/[employer]ERAL EMP PPO   For all possible CPT codes, reference the Planned Interventions line above.     Check all conditions that are expected to impact treatment: {Conditions expected to impact treatment:Diabetes mellitus, Complications related to surgery, and Unknown   If treatment provided at initial evaluation, no treatment charged due to lack of authorization.

## 2023-08-17 ENCOUNTER — Ambulatory Visit (HOSPITAL_COMMUNITY)

## 2023-08-17 ENCOUNTER — Encounter (HOSPITAL_COMMUNITY): Payer: Self-pay

## 2023-08-17 DIAGNOSIS — R262 Difficulty in walking, not elsewhere classified: Secondary | ICD-10-CM

## 2023-08-17 DIAGNOSIS — R2689 Other abnormalities of gait and mobility: Secondary | ICD-10-CM

## 2023-08-17 DIAGNOSIS — M25662 Stiffness of left knee, not elsewhere classified: Secondary | ICD-10-CM

## 2023-08-17 NOTE — Therapy (Signed)
 OUTPATIENT PHYSICAL THERAPY LOWER EXTREMITY TREATMENT    Patient Name: Pamela Stein MRN: [id number] DOB:1955-10-28, 68 y.o., female Today's Date: 08/17/2023  END OF SESSION:  PT End of Session - 08/17/23 1349     Visit Number 3    Number of Visits 10    Date for PT Re-Evaluation 09/10/23    Authorization Type BCBS/F[employer]ER[employer] PPO    Authorization Time Period seeking auth    Progress Note Due on Visit 10    PT Start Time 1350    PT Stop Time 1430    PT Time Calculation (min) 40 min    Activity Tolerance Patient tolerated treatment well    Behavior During Therapy WFL for tasks assessed/performed           Past Medical History:  Diagnosis Date   Anxiety    Back pain    Bronchitis    Diabetes mellitus    Dizziness    Heart murmur    History of gout    Hyperlipidemia    Hypertension    Neuropathy    Obesity    Obstructive sleep apnea    CPAP   Past Surgical History:  Procedure Laterality Date   ABDOMINAL HYSTERECTOMY     [facility]   ADENOIDECTOMY     CATARACT EXTRACTION Left    right 02/2018   COLONOSCOPY  2008   diminutive rectal polyp, s/p removal. polypoid mucosa   COLONOSCOPY WITH PROPOFOL  N/A 01/03/2018   Procedure: COLONOSCOPY WITH PROPOFOL ;  Surgeon: Shaaron Lamar HERO, MD;  [facility];  Service: Endoscopy;  Laterality: N/A;  12:00pm   DILATION AND CURETTAGE OF UTERUS     ENDOMETRIAL ABLATION     EYE SURGERY Bilateral 12/04/2013   cataract   PARATHYROIDECTOMY N/A 12/22/2016   Procedure: PARATHYROIDECTOMY, NECK EXPLORATION;  Surgeon: Lily Boas, MD;  Location: [facility];  Service: General;  Laterality: N/A;   TONSILLECTOMY     TOTAL KNEE ARTHROPLASTY Left 05/28/2020   Procedure: LEFT TOTAL KNEE ARTHROPLASTY;  Surgeon: Margrette Taft BRAVO, MD;  Location: [facility];  Service: Orthopedics;  Laterality: Left;   WOUND EXPLORATION Left 05/31/2020   Procedure: WOUND EXPLORATION AND IRRIGATION LEFT KNEE;  Surgeon: Margrette Taft BRAVO, MD;  Location: [facility];  Service: Orthopedics;  Laterality: Left;  irrigation wound exploration, wound closure   Patient Active Problem List   Diagnosis Date Noted   S/P abdominal supracervical subtotal hysterectomy 10/08/2020   Encounter for screening fecal occult blood testing 10/08/2020   Encounter for gynecological examination with Papanicolaou smear of cervix 10/08/2020   Chronic pain of left knee 05/31/2020   Wound dehiscence, surgical    Status post total left knee replacement 05/28/2020   Primary osteoarthritis of left knee    Polyneuropathy due to secondary diabetes mellitus (HCC) 05/25/2019   Arthritis of right sacroiliac joint (HCC) 05/25/2019   Cervical disc disorder 05/25/2019   Falls 05/25/2019   General unsteadiness 05/25/2019   Encounter for well woman exam with routine gynecological exam 01/09/2019   Screening for colorectal cancer 01/09/2019   Encounter for screening colonoscopy 12/13/2017   Hyperparathyroidism, primary (HCC) 12/22/2016   Vitamin D  deficiency 10/31/2015   Hyperuricemia 05/08/2013   ONYCHOMYCOSIS, TOENAILS 02/22/2009   Benign neoplasm of skin 02/22/2009   Diabetes mellitus (HCC) 01/25/2008   BACK PAIN WITH RADICULOPATHY 11/09/2007   Hyperlipidemia LDL goal <100 08/26/2007   Morbid obesity (HCC) 08/26/2007   Essential hypertension 08/26/2007   Obstructive sleep apnea 04/20/2007  PCP: Carlette Benita Area, MD   REFERRING PROVIDER: Margrette Taft BRAVO, MD  REFERRING DIAG: [phone] (ICD-10-CM) - S/P total knee replacement, left M17.12 (ICD-10-CM) - Unilateral primary osteoarthritis, left knee R26.81 (ICD-10-CM) - Unsteady gait  THERAPY DIAG:  Difficulty in walking, not elsewhere classified  Stiffness of left knee, not elsewhere classified  Other abnormalities of gait and mobility  Rationale for Evaluation and Treatment: Rehabilitation  ONSET DATE: L TKA 2022  SUBJECTIVE:   SUBJECTIVE STATEMENT: Pt reports she is feeling okay today. Little to no  pain. Reports she did HEP last week but hasn't done anything since Thursday.   Patient was out of town through the weekend to sing at a cousins funeral; has not had time to do her exercises.  Feels like her left leg is shorter than her right.    PERTINENT HISTORY: Diabetes, controlled by meds HTN, medicated PAIN:  Are you having pain? No  PRECAUTIONS: Fall  RED FLAGS: None   WEIGHT BEARING RESTRICTIONS: No  FALLS:  Has patient fallen in last 6 months? 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  DIAGNOSTIC FINDINGS: X-ray report  Chief complaint LEFT TKA 2022ATTUNE PS FB  Images 3  Reading: SIZING LOOKS APPROPRIATE  ALINGNMENT -3 TO 3+ WEAR NONE NOTICEABLE   Impression: NORMAL POST OP KNEE 3 YRS OUT   PATIENT SURVEYS:  LEFS : 37/80   COGNITION: Overall cognitive status: Within functional limits for tasks assessed     SENSATION: WFL, pt states she has some neuropathy in LLE but not as bad as in head   PALPATION: No tenderness to palpation  LOWER EXTREMITY ROM:  Active ROM Right eval Left eval  Hip flexion    Hip extension    Hip abduction    Hip adduction    Hip internal rotation    Hip external rotation    Knee flexion 120 118, no pain  Knee extension    Ankle dorsiflexion    Ankle plantarflexion    Ankle inversion    Ankle eversion     (Blank rows = not tested)  LOWER EXTREMITY MMT:  MMT Right eval Left eval  Hip flexion 4 3+  Hip extension 4 3+  Hip abduction 4 3  Hip adduction 4 3   Hip internal rotation    Hip external rotation    Knee flexion 4 3+  Knee extension 4 3+  Ankle dorsiflexion 4 3  Ankle plantarflexion    Ankle inversion    Ankle eversion     (Blank rows = not tested)   FUNCTIONAL TESTS:  5 times sit to stand: 23.61 seconds 2 minute walk test: 188 feet  GAIT: Distance walked: 280 feet Assistive device utilized: Quad cane small base Level of assistance: Modified independence Comments: pt demonstrates decreased left foot clearance about every 4th step, decreased left step length especially coming to the end of .  TREATMENT DATE:  08/17/23: Recumbent bike, seat 9, 5' Supine:  Bridges, 2x10  SL bridge, 15x LLE, 10x RLE Supine hip adduction, 5 holds, 2x10 Hip abduction, RTB, 2x10 Clamshells, RTB, 2x10 Seated LAQ, 10x each LE, quick conc, slow ecc Ambulation with SPC, 50 ft, CGA Heel raises, // bars, 2x10 Side Stepping, in // bars, 10 ftx6   08/11/23 Nustep seat 8 x 5' dynamic warm up Review of HEP and goals Discussion of use of AFO (she states has one does not wear it) Sit to stand 2 x 10 no UE assist SLS 4 on left and 8 on right Tandem stance x 30 each side with intermittent hand assist Supine: Bridge 3 hold 2 x 10 SLR x 10   08/06/2023   Evaluation: -ROM measured, Strength assessed, HEP prescribed, pt educated on prognosis, findings, and importance of HEP compliance if given.   PATIENT EDUCATION:  Education details: Pt was educated on findings of PT evaluation, prognosis, frequency of therapy visits and rationale, attendance policy, and HEP if given.   Person educated: Patient Education method: Explanation, Verbal cues, and Handouts Education comprehension: verbalized understanding, verbal cues required, and needs further education  HOME EXERCISE PROGRAM: 08/11/23 - tandem stance balance; try  not to hold on  - 2 x daily - 7 x weekly - 1 sets - 5 reps - 30 sec hold - Clamshell  - 2 x daily - 7 x weekly - 2 sets - 10 reps  Access Code: [email or web address] URL: [email or web address] Date: 08/06/2023 Prepared by: Lang Ada  Exercises - Supine Bridge  - 1 x daily - 7 x weekly - 3 sets - 10 reps - Sit to Stand with Arms Crossed  - 1 x daily - 7 x weekly - 3 sets - 10 reps  ASSESSMENT:  CLINICAL IMPRESSION: Patient tolerated session well. Began with general warm up of tissues on recumbent bike. Followed with LE strengthening in supine position. Progressed bridges to single leg. Patient tolerated well. Added resistance to clamshells with pt tolerating well. Ambulated with SPC, requiring CGA for imbalance throughout. Ended with standing LE strengthening, pt needing standing rest breaks throughout.  Patient will benefit from continued skilled therapy services to address deficits and promote return to optimal function.   Eval:Patient is a 68 y.o. female who was seen today for physical therapy evaluation and treatment for Z96.652 (ICD-10-CM) - S/P total knee replacement, left M17.12 (ICD-10-CM) - Unilateral primary osteoarthritis, left knee R26.81 (ICD-10-CM) - Unsteady gait.   Patient demonstrates decreased LE strength, decreased functional mobility, and impaired balance. Patient also demonstrates difficulty with ambulation during today's session with decreased endurance, stride length and velocity noted. Patient also demonstrates WFL ROM of left knee compared to right. Patient requires increased time for STS transfer and ambulation. General LE strengthening and endurance training indicated due to left knee instability reported and decreased strength observed throughout LLE. Patient would benefit from skilled physical therapy for increased endurance with ambulation, increased LLE strength, and balance for improved gait quality, return to higher level of function with ADLs, and progress  towards therapy goals.   OBJECTIVE IMPAIRMENTS: Abnormal gait, decreased activity tolerance, decreased balance, decreased endurance, decreased mobility, difficulty walking, decreased ROM, decreased strength, and obesity.   ACTIVITY LIMITATIONS: carrying, lifting, bending, standing, squatting, stairs, transfers, bed mobility, bathing, and locomotion level  PARTICIPATION LIMITATIONS: meal prep, cleaning, laundry, shopping, community activity, and yard work  PERSONAL FACTORS: Age, Fitness, Time since onset of injury/illness/exacerbation, and 1-2 comorbidities: HTN, Diabetes are also affecting  patient's functional outcome.   REHAB POTENTIAL: Fair chronic in nature  CLINICAL DECISION MAKING: Stable/uncomplicated  EVALUATION COMPLEXITY: Low   GOALS: Goals reviewed with patient? No  SHORT TERM GOALS: Target date: 08/20/23  Patient will demonstrate evidence of independence with individualized HEP and will report compliance for at least 3 days per week for optimized progression towards remaining therapy goals. Baseline:  Goal status: IN PROGRESS  2.  Patient will report a decrease knee buckling episodes to less than 5 times per week for improved functional mobility and decrease fall risk.  Baseline: several times Goal status: IN PROGRESS     LONG TERM GOALS: Target date: 09/10/23  Pt will demonstrate a an increase of at least 9 points on the LEFS for improved performance of community ambulation and ADL. Baseline: see objective Goal status: IN PROGRESS  2.  Pt will improve 2 MWT by 50 feet in order to demonstrate improved functional ambulatory capacity in community setting.  Baseline:  Goal status: IN PROGRESS   4.  Pt will demonstrate at least 4/5 MMT for L lower extremity for increased strength during ADL and community ambulation. Baseline:  Goal status: IN PROGRESS  5.  Pt will improve 5TSTS by at least 2.3 seconds in order to improve functional strength during functional  transfers. Baseline:  Goal status: IN PROGRESS    PLAN:  PT FREQUENCY: 2x/week  PT DURATION: other: 5 weeks  PLANNED INTERVENTIONS: 97110-Therapeutic exercises, 97530- Therapeutic activity, 97112- Neuromuscular re-education, 97535- Self Care, [id number]- Manual therapy, [phone]- Gait training, Patient/Family education, Balance training, Stair training, Joint mobilization, and DME instructions  PLAN FOR NEXT SESSION:  progress LE, assess balance formally, progress endurance training   4:05 PM, 08/17/23 Melysa Schroyer Powell-Butler, PT, DPT [facility] [place]

## 2023-08-26 ENCOUNTER — Encounter (HOSPITAL_COMMUNITY): Payer: Self-pay

## 2023-08-26 ENCOUNTER — Ambulatory Visit (HOSPITAL_COMMUNITY)

## 2023-08-26 DIAGNOSIS — M25662 Stiffness of left knee, not elsewhere classified: Secondary | ICD-10-CM

## 2023-08-26 DIAGNOSIS — R262 Difficulty in walking, not elsewhere classified: Secondary | ICD-10-CM

## 2023-08-26 DIAGNOSIS — R2689 Other abnormalities of gait and mobility: Secondary | ICD-10-CM

## 2023-08-26 NOTE — Therapy (Signed)
 OUTPATIENT PHYSICAL THERAPY LOWER EXTREMITY TREATMENT    Patient Name: Pamela Stein MRN: [id number] DOB:1955-12-10, 68 y.o., female Today's Date: 08/26/2023  END OF SESSION:  PT End of Session - 08/26/23 0937     Visit Number 4    Number of Visits 10    Date for PT Re-Evaluation 09/10/23    Authorization Type [employer] P[employer]    Authorization Time Period no auth required    Progress Note Due on Visit 10    PT Start Time 0940   Late sign in   PT Stop Time 1015    PT Time Calculation (min) 35 min    Equipment Utilized During Treatment Gait belt    Activity Tolerance Patient tolerated treatment well    Behavior During Therapy WFL for tasks assessed/performed           Past Medical History:  Diagnosis Date   Anxiety    Back pain    Bronchitis    Diabetes mellitus    Dizziness    Heart murmur    History of gout    Hyperlipidemia    Hypertension    Neuropathy    Obesity    Obstructive sleep apnea    CPAP   Past Surgical History:  Procedure Laterality Date   ABDOMINAL HYSTERECTOMY     [facility]   ADENOIDECTOMY     CATARACT EXTRACTION Left    right 02/2018   COLONOSCOPY  2008   diminutive rectal polyp, s/p removal. polypoid mucosa   COLONOSCOPY WITH PROPOFOL  N/A 01/03/2018   Procedure: COLONOSCOPY WITH PROPOFOL ;  Surgeon: Shaaron Lamar HERO, MD;  Location: [facility];  Service: Endoscopy;  Laterality: N/A;  12:00pm   DILATION AND CURETTAGE OF UTERUS     ENDOMETRIAL ABLATION     EYE SURGERY Bilateral 12/04/2013   cataract   PARATHYROIDECTOMY N/A 12/22/2016   Procedure: PARATHYROIDECTOMY, NECK EXPLORATION;  Surgeon: Lily Boas, MD;  Location: [facility];  Service: General;  Laterality: N/A;   TONSILLECTOMY     TOTAL KNEE ARTHROPLASTY Left 05/28/2020   Procedure: LEFT TOTAL KNEE ARTHROPLASTY;  Surgeon: Margrette Taft BRAVO, MD;  Location: [facility];  Service: Orthopedics;  Laterality: Left;   WOUND EXPLORATION Left 05/31/2020   Procedure: WOUND EXPLORATION AND  IRRIGATION LEFT KNEE;  Surgeon: Margrette Taft BRAVO, MD;  Location: [facility];  Service: Orthopedics;  Laterality: Left;  irrigation wound exploration, wound closure   Patient Active Problem List   Diagnosis Date Noted   S/P abdominal supracervical subtotal hysterectomy 10/08/2020   Encounter for screening fecal occult blood testing 10/08/2020   Encounter for gynecological examination with Papanicolaou smear of cervix 10/08/2020   Chronic pain of left knee 05/31/2020   Wound dehiscence, surgical    Status post total left knee replacement 05/28/2020   Primary osteoarthritis of left knee    Polyneuropathy due to secondary diabetes mellitus (HCC) 05/25/2019   Arthritis of right sacroiliac joint (HCC) 05/25/2019   Cervical disc disorder 05/25/2019   Falls 05/25/2019   General unsteadiness 05/25/2019   Encounter for well woman exam with routine gynecological exam 01/09/2019   Screening for colorectal cancer 01/09/2019   Encounter for screening colonoscopy 12/13/2017   Hyperparathyroidism, primary (HCC) 12/22/2016   Vitamin D  deficiency 10/31/2015   Hyperuricemia 05/08/2013   ONYCHOMYCOSIS, TOENAILS 02/22/2009   Benign neoplasm of skin 02/22/2009   Diabetes mellitus (HCC) 01/25/2008   BACK PAIN WITH RADICULOPATHY 11/09/2007   Hyperlipidemia LDL goal <100 08/26/2007   Morbid obesity (HCC) 08/26/2007  Essential hypertension 08/26/2007   Obstructive sleep apnea 04/20/2007    PCP: Carlette Benita Area, MD   REFERRING PROVIDER: Margrette Taft BRAVO, MD  REFERRING DIAG: [phone] (ICD-10-CM) - S/P total knee replacement, left M17.12 (ICD-10-CM) - Unilateral primary osteoarthritis, left knee R26.81 (ICD-10-CM) - Unsteady gait  THERAPY DIAG:  Difficulty in walking, not elsewhere classified  Stiffness of left knee, not elsewhere classified  Other abnormalities of gait and mobility  Rationale for Evaluation and Treatment: Rehabilitation  ONSET DATE: L TKA 2022  SUBJECTIVE:    SUBJECTIVE STATEMENT: Reports cousin in hospital and has not complete any exercise all week.  Feels some sadness/depression.  No reports of pain today.  Feels she has one leg longer when walking.   PERTINENT HISTORY: Diabetes, controlled by meds HTN, medicated PAIN:  Are you having pain? No  PRECAUTIONS: Fall  RED FLAGS: None   WEIGHT BEARING RESTRICTIONS: No  FALLS:  Has patient fallen in last 6 months? 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  DIAGNOSTIC FINDINGS: X-ray report  Chief complaint LEFT TKA 2022ATTUNE PS FB  Images 3  Reading: SIZING LOOKS APPROPRIATE  ALINGNMENT -3 TO 3+ WEAR NONE NOTICEABLE   Impression: NORMAL POST OP KNEE 3 YRS OUT   PATIENT SURVEYS:  LEFS : 37/80   COGNITION: Overall cognitive status: Within functional limits for tasks assessed     SENSATION: WFL, pt states she has some neuropathy in LLE but not as bad as in head   PALPATION: No tenderness to palpation  LOWER EXTREMITY ROM:  Active ROM Right eval Left eval  Hip flexion    Hip extension    Hip abduction    Hip adduction    Hip internal rotation    Hip external rotation    Knee flexion 120 118, no pain  Knee extension    Ankle dorsiflexion    Ankle plantarflexion    Ankle inversion    Ankle eversion     (Blank rows = not tested)  LOWER EXTREMITY MMT:  MMT Right eval Left eval  Hip flexion 4 3+  Hip extension 4 3+  Hip abduction 4 3  Hip adduction 4 3  Hip internal rotation    Hip external  rotation    Knee flexion 4 3+  Knee extension 4 3+  Ankle dorsiflexion 4 3  Ankle plantarflexion    Ankle inversion    Ankle eversion     (Blank rows = not tested)   FUNCTIONAL TESTS:  5 times sit to stand: 23.61 seconds 2 minute walk test: 188 feet  GAIT: Distance walked: 280 feet Assistive device utilized: Quad cane small base Level of assistance: Modified independence Comments: pt demonstrates decreased left foot clearance about every 4th step, decreased left step length especially coming to the end of .  TREATMENT DATE:  08/26/23: Nustep [place] UE/LE x 5 L3 resistance BERG  1. SITTING TO STANDING  4 able to stand without using hands and stabilize independently  2. STANDING UNSUPPORTED  4 able to stand safely for 2 minutes  If a subject is able to stand 2 minutes unsupported, score full points for sitting unsupported. Proceed to item #4  3. SITTING WITH BACK UNSUPPORTED BUT FEET SUPPORTED ON FLOOR OR ON A STOOL 4 able to sit safely and securely for 2 minutes  4. STANDING TO SITTING 4 sits safely with minimal use of hands  5. TRANSFERS 4 able to transfer safely with minor use of hands  6. STANDING UNSUPPORTED WITH EYES CLOSED  3 able to stand 10 seconds with supervision  7. STANDING UNSUPPORTED WITH FEET TOGETHER  3 able to place feet together independently and stand 1 minute with supervision  8. REACHING FORWARD WITH OUTSTRETCHED ARM WHILE STANDING 2 can reach forward 5 cm (2 inches  9. PICK UP OBJECT FROM THE FLOOR FROM A STANDING POSITION  3 able to pick up slipper but needs supervision  10. TURNING TO LOOK BEHIND OVER LEFT AND RIGHT SHOULDERS WHILE STANDING  3 looks behind one side only other side shows less weight shift  11. TURN 360 DEGREES 1 needs close supervision or verbal cuing  12. PLACE ALTERNATE FOOT ON STEP OR  STOOL WHILE STANDING UNSUPPORTED  0 needs assistance to keep from falling/unable to try  13. STANDING UNSUPPORTED ONE FOOT IN FRONT 1 needs help to step but can hold 15 seconds  14. STANDING ON ONE LEG 0 unable to try of needs assist to prevent fall   TOTAL SCORE  36/56 21-40 = walking with assistance   Marching 10x 3 (required HHA for safety) SLS 3 max BLE  Rockerboard lateral weight shifting then Df/Pf 1' each Sidestep with RTB around thigh 2RT inside // bars with minimal HHA        08/17/23: Recumbent bike, seat 9, 5' Supine:  Bridges, 2x10  SL bridge, 15x LLE, 10x RLE Supine hip adduction, 5 holds, 2x10 Hip abduction, RTB, 2x10 Clamshells, RTB, 2x10 Seated LAQ, 10x each LE, quick conc, slow ecc Ambulation with SPC, 50 ft, CGA Heel raises, // bars, 2x10 Side Stepping, in // bars, 10 ftx6   08/11/23 Nustep seat 8 x 5' dynamic warm up Review of HEP and goals Discussion of use of AFO (she states has one does not wear it) Sit to stand 2 x 10 no UE assist SLS 4 on left and 8 on right Tandem stance x 30 each side with intermittent hand assist Supine: Bridge 3 hold 2 x 10 SLR x 10   08/06/2023   Evaluation: -ROM measured, Strength assessed, HEP prescribed, pt educated on prognosis, findings, and importance of HEP compliance if given.   PATIENT EDUCATION:  Education details: Pt was educated on findings of PT evaluation, prognosis, frequency of therapy visits and rationale, attendance policy, and HEP if given.   Person educated: Patient Education method: Explanation, Verbal cues, and Handouts Education comprehension: verbalized understanding, verbal cues required, and needs further education  HOME EXERCISE PROGRAM: 08/11/23 - tandem stance balance; try not to hold on  - 2 x daily - 7 x weekly - 1 sets - 5 reps - 30 sec hold - Clamshell  - 2 x daily - 7 x weekly - 2 sets - 10 reps  Access Code: [email or web address] URL: [email or web address] Date:  08/06/2023 Prepared by: Lang Ada  Exercises -  Supine Bridge  - 1 x daily - 7 x weekly - 3 sets - 10 reps - Sit to Stand with Arms Crossed  - 1 x daily - 7 x weekly - 3 sets - 10 reps  ASSESSMENT:  CLINICAL IMPRESSION: Pt with LOB required assistance for fall prevention several times this session.  BERG complete for balance assessment with score 36/56 indicating pt as high risk for falls.  Pt ambulates with QC, difficult to put all prongs down at once, cueing to improve awareness.  Balance activities complete with intermittent HHA required and min A for safety.  Increased difficulty with NBOS and SLS based activities.  No reports of pain through session, was limited by appropriate fatiuge levels required occasional seated rest break.   Eval:Patient is a 68 y.o. female who was seen today for physical therapy evaluation and treatment for Z96.652 (ICD-10-CM) - S/P total knee replacement, left M17.12 (ICD-10-CM) - Unilateral primary osteoarthritis, left knee R26.81 (ICD-10-CM) - Unsteady gait.   Patient demonstrates decreased LE strength, decreased functional mobility, and impaired balance. Patient also demonstrates difficulty with ambulation during today's session with decreased endurance, stride length and velocity noted. Patient also demonstrates WFL ROM of left knee compared to right. Patient requires increased time for STS transfer and ambulation. General LE strengthening and endurance training indicated due to left knee instability reported and decreased strength observed throughout LLE. Patient would benefit from skilled physical therapy for increased endurance with ambulation, increased LLE strength, and balance for improved gait quality, return to higher level of function with ADLs, and progress towards therapy goals.   OBJECTIVE IMPAIRMENTS: Abnormal gait, decreased activity tolerance, decreased balance, decreased endurance, decreased mobility, difficulty walking, decreased ROM, decreased  strength, and obesity.   ACTIVITY LIMITATIONS: carrying, lifting, bending, standing, squatting, stairs, transfers, bed mobility, bathing, and locomotion level  PARTICIPATION LIMITATIONS: meal prep, cleaning, laundry, shopping, community activity, and yard work  PERSONAL FACTORS: Age, Fitness, Time since onset of injury/illness/exacerbation, and 1-2 comorbidities: HTN, Diabetes are also affecting patient's functional outcome.   REHAB POTENTIAL: Fair chronic in nature  CLINICAL DECISION MAKING: Stable/uncomplicated  EVALUATION COMPLEXITY: Low   GOALS: Goals reviewed with patient? No  SHORT TERM GOALS: Target date: 08/20/23  Patient will demonstrate evidence of independence with individualized HEP and will report compliance for at least 3 days per week for optimized progression towards remaining therapy goals. Baseline:  Goal status: IN PROGRESS  2.  Patient will report a decrease knee buckling episodes to less than 5 times per week for improved functional mobility and decrease fall risk.  Baseline: several times Goal status: IN PROGRESS     LONG TERM GOALS: Target date: 09/10/23  Pt will demonstrate a an increase of at least 9 points on the LEFS for improved performance of community ambulation and ADL. Baseline: see objective Goal status: IN PROGRESS  2.  Pt will improve 2 MWT by 50 feet in order to demonstrate improved functional ambulatory capacity in community setting.  Baseline:  Goal status: IN PROGRESS   4.  Pt will demonstrate at least 4/5 MMT for L lower extremity for increased strength during ADL and community ambulation. Baseline:  Goal status: IN PROGRESS  5.  Pt will improve 5TSTS by at least 2.3 seconds in order to improve functional strength during functional transfers. Baseline:  Goal status: IN PROGRESS    PLAN:  PT FREQUENCY: 2x/week  PT DURATION: other: 5 weeks  PLANNED INTERVENTIONS: 97110-Therapeutic exercises, 97530- Therapeutic activity,  [id number]- Neuromuscular re-education, 97535- Self  Care, [id number]- Manual therapy, [phone]- Gait training, Patient/Family education, Balance training, Stair training, Joint mobilization, and DME instructions  PLAN FOR NEXT SESSION:  progress LE, balance, progress endurance training  Augustin Mclean, LPTA/CLT; CBIS [phone]  4:24 PM, 08/26/23

## 2023-09-01 ENCOUNTER — Encounter (HOSPITAL_COMMUNITY): Payer: Self-pay

## 2023-09-01 ENCOUNTER — Ambulatory Visit (HOSPITAL_COMMUNITY)

## 2023-09-01 DIAGNOSIS — R262 Difficulty in walking, not elsewhere classified: Secondary | ICD-10-CM | POA: Diagnosis not present

## 2023-09-01 DIAGNOSIS — M25662 Stiffness of left knee, not elsewhere classified: Secondary | ICD-10-CM

## 2023-09-01 DIAGNOSIS — R2689 Other abnormalities of gait and mobility: Secondary | ICD-10-CM

## 2023-09-01 NOTE — Therapy (Signed)
 OUTPATIENT PHYSICAL THERAPY LOWER EXTREMITY TREATMENT    Patient Name: Pamela Stein MRN: [id number] DOB:1955/05/12, 68 y.o., female Today's Date: 09/01/2023  END OF SESSION:  PT End of Session - 09/01/23 1204     Visit Number 5    Number of Visits 10    Date for PT Re-Evaluation 09/10/23    Authorization Type [employer] P[employer]    Authorization Time Period no auth required    Progress Note Due on Visit 10    PT Start Time 1202    PT Stop Time 1230    PT Time Calculation (min) 28 min    Equipment Utilized During Treatment Gait belt    Activity Tolerance Patient tolerated treatment well    Behavior During Therapy WFL for tasks assessed/performed           Past Medical History:  Diagnosis Date   Anxiety    Back pain    Bronchitis    Diabetes mellitus    Dizziness    Heart murmur    History of gout    Hyperlipidemia    Hypertension    Neuropathy    Obesity    Obstructive sleep apnea    CPAP   Past Surgical History:  Procedure Laterality Date   ABDOMINAL HYSTERECTOMY     [facility]   ADENOIDECTOMY     CATARACT EXTRACTION Left    right 02/2018   COLONOSCOPY  2008   diminutive rectal polyp, s/p removal. polypoid mucosa   COLONOSCOPY WITH PROPOFOL  N/A 01/03/2018   Procedure: COLONOSCOPY WITH PROPOFOL ;  Surgeon: Shaaron Lamar HERO, MD;  Location: [facility];  Service: Endoscopy;  Laterality: N/A;  12:00pm   DILATION AND CURETTAGE OF UTERUS     ENDOMETRIAL ABLATION     EYE SURGERY Bilateral 12/04/2013   cataract   PARATHYROIDECTOMY N/A 12/22/2016   Procedure: PARATHYROIDECTOMY, NECK EXPLORATION;  Surgeon: Lily Boas, MD;  Location: [facility];  Service: General;  Laterality: N/A;   TONSILLECTOMY     TOTAL KNEE ARTHROPLASTY Left 05/28/2020   Procedure: LEFT TOTAL KNEE ARTHROPLASTY;  Surgeon: Margrette Taft BRAVO, MD;  Location: [facility];  Service: Orthopedics;  Laterality: Left;   WOUND EXPLORATION Left 05/31/2020   Procedure: WOUND EXPLORATION AND IRRIGATION LEFT  KNEE;  Surgeon: Margrette Taft BRAVO, MD;  Location: [facility];  Service: Orthopedics;  Laterality: Left;  irrigation wound exploration, wound closure   Patient Active Problem List   Diagnosis Date Noted   S/P abdominal supracervical subtotal hysterectomy 10/08/2020   Encounter for screening fecal occult blood testing 10/08/2020   Encounter for gynecological examination with Papanicolaou smear of cervix 10/08/2020   Chronic pain of left knee 05/31/2020   Wound dehiscence, surgical    Status post total left knee replacement 05/28/2020   Primary osteoarthritis of left knee    Polyneuropathy due to secondary diabetes mellitus (HCC) 05/25/2019   Arthritis of right sacroiliac joint (HCC) 05/25/2019   Cervical disc disorder 05/25/2019   Falls 05/25/2019   General unsteadiness 05/25/2019   Encounter for well woman exam with routine gynecological exam 01/09/2019   Screening for colorectal cancer 01/09/2019   Encounter for screening colonoscopy 12/13/2017   Hyperparathyroidism, primary (HCC) 12/22/2016   Vitamin D  deficiency 10/31/2015   Hyperuricemia 05/08/2013   ONYCHOMYCOSIS, TOENAILS 02/22/2009   Benign neoplasm of skin 02/22/2009   Diabetes mellitus (HCC) 01/25/2008   BACK PAIN WITH RADICULOPATHY 11/09/2007   Hyperlipidemia LDL goal <100 08/26/2007   Morbid obesity (HCC) 08/26/2007   Essential hypertension  08/26/2007   Obstructive sleep apnea 04/20/2007    PCP: Carlette Benita Area, MD   REFERRING PROVIDER: Margrette Taft BRAVO, MD  REFERRING DIAG: [phone] (ICD-10-CM) - S/P total knee replacement, left M17.12 (ICD-10-CM) - Unilateral primary osteoarthritis, left knee R26.81 (ICD-10-CM) - Unsteady gait  THERAPY DIAG:  Difficulty in walking, not elsewhere classified  Stiffness of left knee, not elsewhere classified  Other abnormalities of gait and mobility  Rationale for Evaluation and Treatment: Rehabilitation  ONSET DATE: L TKA 2022  SUBJECTIVE:   SUBJECTIVE  STATEMENT: Session limited due to pt late arrival. Reports no falls since last visit.    PERTINENT HISTORY: Diabetes, controlled by meds HTN, medicated PAIN:  Are you having pain? No  PRECAUTIONS: Fall  RED FLAGS: None   WEIGHT BEARING RESTRICTIONS: No  FALLS:  Has patient fallen in last 6 months? 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  DIAGNOSTIC FINDINGS: X-ray report  Chief complaint LEFT TKA 2022ATTUNE PS FB  Images 3  Reading: SIZING LOOKS APPROPRIATE  ALINGNMENT -3 TO 3+ WEAR NONE NOTICEABLE   Impression: NORMAL POST OP KNEE 3 YRS OUT   PATIENT SURVEYS:  LEFS : 37/80   COGNITION: Overall cognitive status: Within functional limits for tasks assessed     SENSATION: WFL, pt states she has some neuropathy in LLE but not as bad as in head   PALPATION: No tenderness to palpation  LOWER EXTREMITY ROM:  Active ROM Right eval Left eval  Hip flexion    Hip extension    Hip abduction    Hip adduction    Hip internal rotation    Hip external rotation    Knee flexion 120 118, no pain  Knee extension    Ankle dorsiflexion    Ankle plantarflexion    Ankle inversion    Ankle eversion     (Blank rows = not tested)  LOWER EXTREMITY MMT:  MMT Right eval Left eval  Hip flexion 4 3+  Hip extension 4 3+  Hip abduction 4 3  Hip adduction 4 3  Hip internal rotation    Hip external rotation    Knee flexion 4 3+  Knee extension 4 3+  Ankle dorsiflexion 4 3  Ankle plantarflexion    Ankle  inversion    Ankle eversion     (Blank rows = not tested)   FUNCTIONAL TESTS:  5 times sit to stand: 23.61 seconds 2 minute walk test: 188 feet  GAIT: Distance walked: 280 feet Assistive device utilized: Quad cane small base Level of assistance: Modified independence Comments: pt demonstrates decreased left foot clearance about every 4th step, decreased left step length especially coming to the end of .  TREATMENT DATE:  09/01/23: STS, at // bars, no UE support  10x feet on foam Forward foot taps, 4 inch step, 10x ea LE, no UE support  6 inch step, 20 taps ea LE, finger support bilat--> no UE support Cone taps, flexion and abduction, BUE-->one UE support, 10x each LE Side Steps, 10 ft line x 3, CGA Backward Ambulation, 10 ft, pt demo NBOS    08/26/23: [place] UE/LE x 5 L3 resistance BERG  1. SITTING TO STANDING  4 able to stand without using hands and stabilize independently  2. STANDING UNSUPPORTED  4 able to stand safely for 2 minutes  If a subject is able to stand 2 minutes unsupported, score full points for sitting unsupported. Proceed to item #4  3. SITTING WITH BACK UNSUPPORTED BUT FEET SUPPORTED ON FLOOR OR ON A STOOL 4 able to sit safely and securely for 2 minutes  4. STANDING TO SITTING 4 sits safely with minimal use of hands  5. TRANSFERS 4 able to transfer safely with minor use of hands  6. STANDING UNSUPPORTED WITH EYES CLOSED  3 able to stand 10 seconds with supervision  7. STANDING UNSUPPORTED WITH FEET TOGETHER  3 able to place feet together independently and stand 1 minute with supervision  8. REACHING FORWARD WITH OUTSTRETCHED ARM WHILE STANDING 2 can reach forward 5 cm (2 inches  9. PICK UP OBJECT FROM THE FLOOR FROM A STANDING POSITION  3 able to pick up slipper but needs supervision  10. TURNING TO LOOK  BEHIND OVER LEFT AND RIGHT SHOULDERS WHILE STANDING  3 looks behind one side only other side shows less weight shift  11. TURN 360 DEGREES 1 needs close supervision or verbal cuing  12. PLACE ALTERNATE FOOT ON STEP OR STOOL WHILE STANDING UNSUPPORTED  0 needs assistance to keep from falling/unable to try  13. STANDING UNSUPPORTED ONE FOOT IN FRONT 1 needs help to step but can hold 15 seconds  14. STANDING ON ONE LEG 0 unable to try of needs assist to prevent fall   TOTAL SCORE  36/56 21-40 = walking with assistance   Marching 10x 3 (required HHA for safety) SLS 3 max BLE  Rockerboard lateral weight shifting then Df/Pf 1' each Sidestep with RTB around thigh 2RT inside // bars with minimal HHA   08/17/23: Recumbent bike, seat 9, 5' Supine:  Bridges, 2x10  SL bridge, 15x LLE, 10x RLE Supine hip adduction, 5 holds, 2x10 Hip abduction, RTB, 2x10 Clamshells, RTB, 2x10 Seated LAQ, 10x each LE, quick conc, slow ecc Ambulation with SPC, 50 ft, CGA Heel raises, // bars, 2x10 Side Stepping, in // bars, 10 ftx6   PATIENT EDUCATION:  Education details: Pt was educated on findings of PT evaluation, prognosis, frequency of therapy visits and rationale, attendance policy, and HEP if given.   Person educated: Patient Education method: Explanation, Verbal cues, and Handouts Education comprehension: verbalized understanding, verbal cues required, and needs further education  HOME EXERCISE PROGRAM: 08/11/23 - tandem stance balance; try not to hold on  - 2 x daily - 7 x weekly - 1 sets - 5 reps - 30 sec hold - Clamshell  - 2 x daily - 7 x weekly - 2 sets - 10 reps  Access Code: [email or web address] URL: [email or web address] Date: 08/06/2023 Prepared by: Lang Ada  Exercises - Supine Bridge  - 1 x daily - 7 x weekly - 3 sets - 10 reps - Sit to Stand with Arms Crossed  - 1 x  daily - 7 x weekly - 3 sets - 10 reps  ASSESSMENT:  CLINICAL IMPRESSION: Pt with LOB in lobby after  stand. Reports because she was trying to move fast because late arrival. Patient overall unsteady during gait. Entire session focused on challenging static and dynamic balance. Patient required UE support throughout and CGA. Most difficulty with single leg balance activities. Mild improvements noted with verbal cueing for inc muscle engagement in stabilizing LE. Patient also limited due to LE weakness/fatigue, L>R, during cone activity. NBOS noted during backwards ambulation, cueing given for wider BOS for safety. Patient will benefit from continued skilled physical therapy in order to address strength, endurance, and balance to decrease risk of fall.     Eval:Patient is a 68 y.o. female who was seen today for physical therapy evaluation and treatment for Z96.652 (ICD-10-CM) - S/P total knee replacement, left M17.12 (ICD-10-CM) - Unilateral primary osteoarthritis, left knee R26.81 (ICD-10-CM) - Unsteady gait.  Patient demonstrates decreased LE strength, decreased functional mobility, and impaired balance. Patient also demonstrates difficulty with ambulation during today's session with decreased endurance, stride length and velocity noted. Patient also demonstrates WFL ROM of left knee compared to right. Patient requires increased time for STS transfer and ambulation. General LE strengthening and endurance training indicated due to left knee instability reported and decreased strength observed throughout LLE. Patient would benefit from skilled physical therapy for increased endurance with ambulation, increased LLE strength, and balance for improved gait quality, return to higher level of function with ADLs, and progress towards therapy goals.   OBJECTIVE IMPAIRMENTS: Abnormal gait, decreased activity tolerance, decreased balance, decreased endurance, decreased mobility, difficulty walking, decreased ROM, decreased strength, and obesity.   ACTIVITY LIMITATIONS: carrying, lifting, bending, standing,  squatting, stairs, transfers, bed mobility, bathing, and locomotion level  PARTICIPATION LIMITATIONS: meal prep, cleaning, laundry, shopping, community activity, and yard work  PERSONAL FACTORS: Age, Fitness, Time since onset of injury/illness/exacerbation, and 1-2 comorbidities: HTN, Diabetes are also affecting patient's functional outcome.   REHAB POTENTIAL: Fair chronic in nature  CLINICAL DECISION MAKING: Stable/uncomplicated  EVALUATION COMPLEXITY: Low   GOALS: Goals reviewed with patient? No  SHORT TERM GOALS: Target date: 08/20/23  Patient will demonstrate evidence of independence with individualized HEP and will report compliance for at least 3 days per week for optimized progression towards remaining therapy goals. Baseline:  Goal status: IN PROGRESS  2.  Patient will report a decrease knee buckling episodes to less than 5 times per week for improved functional mobility and decrease fall risk.  Baseline: several times Goal status: IN PROGRESS     LONG TERM GOALS: Target date: 09/10/23  Pt will demonstrate a an increase of at least 9 points on the LEFS for improved performance of community ambulation and ADL. Baseline: see objective Goal status: IN PROGRESS  2.  Pt will improve 2 MWT by 50 feet in order to demonstrate improved functional ambulatory capacity in community setting.  Baseline:  Goal status: IN PROGRESS   4.  Pt will demonstrate at least 4/5 MMT for L lower extremity for increased strength during ADL and community ambulation. Baseline:  Goal status: IN PROGRESS  5.  Pt will improve 5TSTS by at least 2.3 seconds in order to improve functional strength during functional transfers. Baseline:  Goal status: IN PROGRESS    PLAN:  PT FREQUENCY: 2x/week  PT DURATION: other: 5 weeks  PLANNED INTERVENTIONS: 97110-Therapeutic exercises, 97530- Therapeutic activity, [id number]- Neuromuscular re-education, 97535- Self Care, [id number]- Manual therapy, [phone]- Gait  training, Patient/Family  education, Location manager, Stair training, Joint mobilization, and DME instructions  PLAN FOR NEXT SESSION:  progress LE, balance, progress endurance training    12:54 PM, 09/01/23 Rosaria Settler, PT, DPT [facility] [place]

## 2023-09-02 ENCOUNTER — Encounter (HOSPITAL_COMMUNITY): Payer: Self-pay

## 2023-09-02 ENCOUNTER — Ambulatory Visit (HOSPITAL_COMMUNITY)

## 2023-09-02 DIAGNOSIS — R2689 Other abnormalities of gait and mobility: Secondary | ICD-10-CM

## 2023-09-02 DIAGNOSIS — R262 Difficulty in walking, not elsewhere classified: Secondary | ICD-10-CM | POA: Diagnosis not present

## 2023-09-02 DIAGNOSIS — M25662 Stiffness of left knee, not elsewhere classified: Secondary | ICD-10-CM

## 2023-09-02 NOTE — Therapy (Signed)
 OUTPATIENT PHYSICAL THERAPY LOWER EXTREMITY TREATMENT    Patient Name: PASCALE MAVES MRN: [id number] DOB:[birth year], 68 y.o., female Today's Date: 09/02/2023  END OF SESSION:  PT End of Session - 09/02/23 1200     Visit Number 6    Number of Visits 10    Date for PT Re-Evaluation 09/10/23    Authorization Type [employer] P[employer]    Authorization Time Period no auth required    Progress Note Due on Visit 10    PT Start Time 1157    PT Stop Time 1232    PT Time Calculation (min) 35 min    Equipment Utilized During Treatment Gait belt    Activity Tolerance Patient tolerated treatment well    Behavior During Therapy WFL for tasks assessed/performed           Past Medical History:  Diagnosis Date   Anxiety    Back pain    Bronchitis    Diabetes mellitus    Dizziness    Heart murmur    History of gout    Hyperlipidemia    Hypertension    Neuropathy    Obesity    Obstructive sleep apnea    CPAP   Past Surgical History:  Procedure Laterality Date   ABDOMINAL HYSTERECTOMY     [facility]   ADENOIDECTOMY     CATARACT EXTRACTION Left    right 02/2018   COLONOSCOPY  2008   diminutive rectal polyp, s/p removal. polypoid mucosa   COLONOSCOPY WITH PROPOFOL  N/A 01/03/2018   Procedure: COLONOSCOPY WITH PROPOFOL ;  Surgeon: Shaaron Lamar HERO, MD;  Location: [facility];  Service: Endoscopy;  Laterality: N/A;  12:00pm   DILATION AND CURETTAGE OF UTERUS     ENDOMETRIAL ABLATION     EYE SURGERY Bilateral 12/04/2013   cataract   PARATHYROIDECTOMY N/A 12/22/2016   Procedure: PARATHYROIDECTOMY, NECK EXPLORATION;  Surgeon: Lily Boas, MD;  Location: [facility];  Service: General;  Laterality: N/A;   TONSILLECTOMY     TOTAL KNEE ARTHROPLASTY Left 05/28/2020   Procedure: LEFT TOTAL KNEE ARTHROPLASTY;  Surgeon: Margrette Taft BRAVO, MD;  Location: [facility];  Service: Orthopedics;  Laterality: Left;   WOUND EXPLORATION Left 05/31/2020   Procedure: WOUND EXPLORATION AND IRRIGATION LEFT  KNEE;  Surgeon: Margrette Taft BRAVO, MD;  Location: [facility];  Service: Orthopedics;  Laterality: Left;  irrigation wound exploration, wound closure   Patient Active Problem List   Diagnosis Date Noted   S/P abdominal supracervical subtotal hysterectomy 10/08/2020   Encounter for screening fecal occult blood testing 10/08/2020   Encounter for gynecological examination with Papanicolaou smear of cervix 10/08/2020   Chronic pain of left knee 05/31/2020   Wound dehiscence, surgical    Status post total left knee replacement 05/28/2020   Primary osteoarthritis of left knee    Polyneuropathy due to secondary diabetes mellitus (HCC) 05/25/2019   Arthritis of right sacroiliac joint (HCC) 05/25/2019   Cervical disc disorder 05/25/2019   Falls 05/25/2019   General unsteadiness 05/25/2019   Encounter for well woman exam with routine gynecological exam 01/09/2019   Screening for colorectal cancer 01/09/2019   Encounter for screening colonoscopy 12/13/2017   Hyperparathyroidism, primary (HCC) 12/22/2016   Vitamin D  deficiency 10/31/2015   Hyperuricemia 05/08/2013   ONYCHOMYCOSIS, TOENAILS 02/22/2009   Benign neoplasm of skin 02/22/2009   Diabetes mellitus (HCC) 01/25/2008   BACK PAIN WITH RADICULOPATHY 11/09/2007   Hyperlipidemia LDL goal <100 08/26/2007   Morbid obesity (HCC) 08/26/2007   Essential hypertension  08/26/2007   Obstructive sleep apnea 04/20/2007    PCP: Carlette Benita Area, MD   REFERRING PROVIDER: Margrette Taft BRAVO, MD  REFERRING DIAG: [phone] (ICD-10-CM) - S/P total knee replacement, left M17.12 (ICD-10-CM) - Unilateral primary osteoarthritis, left knee R26.81 (ICD-10-CM) - Unsteady gait  THERAPY DIAG:  Difficulty in walking, not elsewhere classified  Stiffness of left knee, not elsewhere classified  Other abnormalities of gait and mobility  Rationale for Evaluation and Treatment: Rehabilitation  ONSET DATE: L TKA 2022  SUBJECTIVE:   SUBJECTIVE  STATEMENT: Session limited due to pt late arrival. Reports she felt fine after yesterday's session.    PERTINENT HISTORY: Diabetes, controlled by meds HTN, medicated PAIN:  Are you having pain? No  PRECAUTIONS: Fall  RED FLAGS: None   WEIGHT BEARING RESTRICTIONS: No  FALLS:  Has patient fallen in last 6 months? 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  DIAGNOSTIC FINDINGS: X-ray report  Chief complaint LEFT TKA 2022ATTUNE PS FB  Images 3  Reading: SIZING LOOKS APPROPRIATE  ALINGNMENT -3 TO 3+ WEAR NONE NOTICEABLE   Impression: NORMAL POST OP KNEE 3 YRS OUT   PATIENT SURVEYS:  LEFS : 37/80   COGNITION: Overall cognitive status: Within functional limits for tasks assessed     SENSATION: WFL, pt states she has some neuropathy in LLE but not as bad as in head   PALPATION: No tenderness to palpation  LOWER EXTREMITY ROM:  Active ROM Right eval Left eval  Hip flexion    Hip extension    Hip abduction    Hip adduction    Hip internal rotation    Hip external rotation    Knee flexion 120 118, no pain  Knee extension    Ankle dorsiflexion    Ankle plantarflexion    Ankle inversion    Ankle eversion     (Blank rows = not tested)  LOWER EXTREMITY MMT:  MMT Right eval Left eval  Hip flexion 4 3+  Hip extension 4 3+  Hip abduction 4 3  Hip adduction 4 3  Hip internal rotation    Hip external rotation    Knee flexion 4 3+  Knee extension 4 3+  Ankle dorsiflexion 4 3  Ankle plantarflexion     Ankle inversion    Ankle eversion     (Blank rows = not tested)   FUNCTIONAL TESTS:  5 times sit to stand: 23.61 seconds 2 minute walk test: 188 feet  GAIT: Distance walked: 280 feet Assistive device utilized: Quad cane small base Level of assistance: Modified independence Comments: pt demonstrates decreased left foot clearance about every 4th step, decreased left step length especially coming to the end of .  TREATMENT DATE:  09/02/23:  Heel raises on incline, at // bars, 30x Toe Raises on decline, at // bars, 30x Marching in // bars, 3 lb. AW, 2x10 ea LE Forward ambulation with R/L head turns, 20 ftx2, one mod-severe LOB, Max A to recover  Vertical head turns, 20 ftx2 Static balance w/ horizontal head turns, ball/cone balance, in // bars, keep eye gaze on ball, 10 passes from R/L hands  Prog to feet on foam, 10 passes  Prog tandem stance, on foot on foam and other on 8 inch step, 10x w/ each LE leading Side Steps, 3 lb. AW, in // bars, 6x   09/01/23: STS, at // bars, no UE support  10x feet on foam Forward foot taps, 4 inch step, 10x ea LE, no UE support  6 inch step, 20 taps ea LE, finger support bilat--> no UE support Cone taps, flexion and abduction, BUE-->one UE support, 10x each LE Side Steps, 10 ft line x 3, CGA Backward Ambulation, 10 ft, pt demo NBOS    08/26/23: [place] UE/LE x 5 L3 resistance BERG  1. SITTING TO STANDING  4 able to stand without using hands and stabilize independently  2. STANDING UNSUPPORTED  4 able to stand safely for 2 minutes  If a subject is able to stand 2 minutes unsupported, score full points for sitting unsupported. Proceed to item #4  3. SITTING WITH BACK UNSUPPORTED BUT FEET SUPPORTED ON FLOOR OR ON A STOOL 4 able to sit safely and securely for 2 minutes  4. STANDING TO SITTING 4 sits  safely with minimal use of hands  5. TRANSFERS 4 able to transfer safely with minor use of hands  6. STANDING UNSUPPORTED WITH EYES CLOSED  3 able to stand 10 seconds with supervision  7. STANDING UNSUPPORTED WITH FEET TOGETHER  3 able to place feet together independently and stand 1 minute with supervision  8. REACHING FORWARD WITH OUTSTRETCHED ARM WHILE STANDING 2 can reach forward 5 cm (2 inches  9. PICK UP OBJECT FROM THE FLOOR FROM A STANDING POSITION  3 able to pick up slipper but needs supervision  10. TURNING TO LOOK BEHIND OVER LEFT AND RIGHT SHOULDERS WHILE STANDING  3 looks behind one side only other side shows less weight shift  11. TURN 360 DEGREES 1 needs close supervision or verbal cuing  12. PLACE ALTERNATE FOOT ON STEP OR STOOL WHILE STANDING UNSUPPORTED  0 needs assistance to keep from falling/unable to try  13. STANDING UNSUPPORTED ONE FOOT IN FRONT 1 needs help to step but can hold 15 seconds  14. STANDING ON ONE LEG 0 unable to try of needs assist to prevent fall   TOTAL SCORE  36/56 21-40 = walking with assistance   Marching 10x 3 (required HHA for safety) SLS 3 max BLE  Rockerboard lateral weight shifting then Df/Pf 1' each Sidestep with RTB around thigh 2RT inside // bars with minimal HHA    PATIENT EDUCATION:  Education details: Pt was educated on findings of PT evaluation, prognosis, frequency of therapy visits and rationale, attendance policy, and HEP if given.   Person educated: Patient Education method: Explanation, Verbal cues, and Handouts Education comprehension: verbalized understanding, verbal cues required, and needs further education  HOME EXERCISE PROGRAM: 08/11/23 - tandem stance balance; try not to hold on  - 2 x daily - 7 x weekly - 1 sets - 5 reps - 30 sec hold - Clamshell  - 2 x daily - 7 x  weekly - 2 sets - 10 reps  Access Code: [email or web address] URL: [email or web address] Date: 08/06/2023 Prepared by: Lang Ada  Exercises - Supine Bridge  - 1 x daily - 7 x weekly - 3 sets - 10 reps - Sit to Stand with Arms Crossed  - 1 x daily - 7 x weekly - 3 sets - 10 reps  ASSESSMENT:  CLINICAL IMPRESSION: Patient tolerated session well. Focused on dynamic balance this date. Patient demonstrates most difficulty with forward ambulation with head turns. Patient demo one instance of moderate-severe LOB, requiring max A to recover. Consistent cueing required for head turns. Followed with static standing with head turns in parallel bars. Progressing to tandem stance on foam and elevated surfaces while turning head and balancing object in hands. CGA required throughout with pt intermittently requiring self UE support to maintain balance. Verbal cueing required during side steps for lifting rather than dragging LLE. Patient will benefit from continued skilled physical therapy in order to address strength, endurance, and balance to decrease risk of fall.      Eval:Patient is a 68 y.o. female who was seen today for physical therapy evaluation and treatment for Z96.652 (ICD-10-CM) - S/P total knee replacement, left M17.12 (ICD-10-CM) - Unilateral primary osteoarthritis, left knee R26.81 (ICD-10-CM) - Unsteady gait.  Patient demonstrates decreased LE strength, decreased functional mobility, and impaired balance. Patient also demonstrates difficulty with ambulation during today's session with decreased endurance, stride length and velocity noted. Patient also demonstrates WFL ROM of left knee compared to right. Patient requires increased time for STS transfer and ambulation. General LE strengthening and endurance training indicated due to left knee instability reported and decreased strength observed throughout LLE. Patient would benefit from skilled physical therapy for increased endurance with ambulation, increased LLE strength, and balance for improved gait quality, return to higher level of function with ADLs, and progress  towards therapy goals.   OBJECTIVE IMPAIRMENTS: Abnormal gait, decreased activity tolerance, decreased balance, decreased endurance, decreased mobility, difficulty walking, decreased ROM, decreased strength, and obesity.   ACTIVITY LIMITATIONS: carrying, lifting, bending, standing, squatting, stairs, transfers, bed mobility, bathing, and locomotion level  PARTICIPATION LIMITATIONS: meal prep, cleaning, laundry, shopping, community activity, and yard work  PERSONAL FACTORS: Age, Fitness, Time since onset of injury/illness/exacerbation, and 1-2 comorbidities: HTN, Diabetes are also affecting patient's functional outcome.   REHAB POTENTIAL: Fair chronic in nature  CLINICAL DECISION MAKING: Stable/uncomplicated  EVALUATION COMPLEXITY: Low   GOALS: Goals reviewed with patient? No  SHORT TERM GOALS: Target date: 08/20/23  Patient will demonstrate evidence of independence with individualized HEP and will report compliance for at least 3 days per week for optimized progression towards remaining therapy goals. Baseline:  Goal status: IN PROGRESS  2.  Patient will report a decrease knee buckling episodes to less than 5 times per week for improved functional mobility and decrease fall risk.  Baseline: several times Goal status: IN PROGRESS     LONG TERM GOALS: Target date: 09/10/23  Pt will demonstrate a an increase of at least 9 points on the LEFS for improved performance of community ambulation and ADL. Baseline: see objective Goal status: IN PROGRESS  2.  Pt will improve 2 MWT by 50 feet in order to demonstrate improved functional ambulatory capacity in community setting.  Baseline:  Goal status: IN PROGRESS   4.  Pt will demonstrate at least 4/5 MMT for L lower extremity for increased strength during ADL and community ambulation. Baseline:  Goal status: IN PROGRESS  5.  Pt  will improve 5TSTS by at least 2.3 seconds in order to improve functional strength during functional  transfers. Baseline:  Goal status: IN PROGRESS    PLAN:  PT FREQUENCY: 2x/week  PT DURATION: other: 5 weeks  PLANNED INTERVENTIONS: 97110-Therapeutic exercises, 97530- Therapeutic activity, 97112- Neuromuscular re-education, 97535- Self Care, [id number]- Manual therapy, [phone]- Gait training, Patient/Family education, Balance training, Stair training, Joint mobilization, and DME instructions  PLAN FOR NEXT SESSION:  progress LE, balance, progress endurance training    12:34 PM, 09/02/23 Rosaria Settler, PT, DPT [facility] [place]

## 2023-09-07 ENCOUNTER — Ambulatory Visit (HOSPITAL_COMMUNITY): Admitting: Physical Therapy

## 2023-09-07 DIAGNOSIS — R2689 Other abnormalities of gait and mobility: Secondary | ICD-10-CM

## 2023-09-07 DIAGNOSIS — M25662 Stiffness of left knee, not elsewhere classified: Secondary | ICD-10-CM

## 2023-09-07 DIAGNOSIS — R262 Difficulty in walking, not elsewhere classified: Secondary | ICD-10-CM

## 2023-09-07 NOTE — Therapy (Signed)
 OUTPATIENT PHYSICAL THERAPY LOWER EXTREMITY TREATMENT    Patient Name: Pamela Stein MRN: [id number] DOB:12/18/1955, 68 y.o., female Today's Date: 09/07/2023  END OF SESSION:  PT End of Session - 09/07/23 1201     Visit Number 7    Number of Visits 10    Date for PT Re-Evaluation 09/10/23    Authorization Type [employer] P[employer]    Authorization Time Period no auth required    Progress Note Due on Visit 10    PT Start Time 1158    PT Stop Time 1230    PT Time Calculation (min) 32 min    Equipment Utilized During Treatment Gait belt    Activity Tolerance Patient tolerated treatment well    Behavior During Therapy WFL for tasks assessed/performed           Past Medical History:  Diagnosis Date   Anxiety    Back pain    Bronchitis    Diabetes mellitus    Dizziness    Heart murmur    History of gout    Hyperlipidemia    Hypertension    Neuropathy    Obesity    Obstructive sleep apnea    CPAP   Past Surgical History:  Procedure Laterality Date   ABDOMINAL HYSTERECTOMY     [facility]   ADENOIDECTOMY     CATARACT EXTRACTION Left    right 02/2018   COLONOSCOPY  2008   diminutive rectal polyp, s/p removal. polypoid mucosa   COLONOSCOPY WITH PROPOFOL  N/A 01/03/2018   Procedure: COLONOSCOPY WITH PROPOFOL ;  Surgeon: Shaaron Lamar HERO, MD;  Location: [facility];  Service: Endoscopy;  Laterality: N/A;  12:00pm   DILATION AND CURETTAGE OF UTERUS     ENDOMETRIAL ABLATION     EYE SURGERY Bilateral 12/04/2013   cataract   PARATHYROIDECTOMY N/A 12/22/2016   Procedure: PARATHYROIDECTOMY, NECK EXPLORATION;  Surgeon: Lily Boas, MD;  Location: [facility];  Service: General;  Laterality: N/A;   TONSILLECTOMY     TOTAL KNEE ARTHROPLASTY Left 05/28/2020   Procedure: LEFT TOTAL KNEE ARTHROPLASTY;  Surgeon: Margrette Taft BRAVO, MD;  Location: [facility];  Service: Orthopedics;  Laterality: Left;   WOUND EXPLORATION Left 05/31/2020   Procedure: WOUND EXPLORATION AND IRRIGATION LEFT  KNEE;  Surgeon: Margrette Taft BRAVO, MD;  Location: [facility];  Service: Orthopedics;  Laterality: Left;  irrigation wound exploration, wound closure   Patient Active Problem List   Diagnosis Date Noted   S/P abdominal supracervical subtotal hysterectomy 10/08/2020   Encounter for screening fecal occult blood testing 10/08/2020   Encounter for gynecological examination with Papanicolaou smear of cervix 10/08/2020   Chronic pain of left knee 05/31/2020   Wound dehiscence, surgical    Status post total left knee replacement 05/28/2020   Primary osteoarthritis of left knee    Polyneuropathy due to secondary diabetes mellitus (HCC) 05/25/2019   Arthritis of right sacroiliac joint (HCC) 05/25/2019   Cervical disc disorder 05/25/2019   Falls 05/25/2019   General unsteadiness 05/25/2019   Encounter for well woman exam with routine gynecological exam 01/09/2019   Screening for colorectal cancer 01/09/2019   Encounter for screening colonoscopy 12/13/2017   Hyperparathyroidism, primary (HCC) 12/22/2016   Vitamin D  deficiency 10/31/2015   Hyperuricemia 05/08/2013   ONYCHOMYCOSIS, TOENAILS 02/22/2009   Benign neoplasm of skin 02/22/2009   Diabetes mellitus (HCC) 01/25/2008   BACK PAIN WITH RADICULOPATHY 11/09/2007   Hyperlipidemia LDL goal <100 08/26/2007   Morbid obesity (HCC) 08/26/2007   Essential hypertension  08/26/2007   Obstructive sleep apnea 04/20/2007    PCP: Carlette Benita Area, MD   REFERRING PROVIDER: Margrette Taft BRAVO, MD  REFERRING DIAG: [phone] (ICD-10-CM) - S/P total knee replacement, left M17.12 (ICD-10-CM) - Unilateral primary osteoarthritis, left knee R26.81 (ICD-10-CM) - Unsteady gait  THERAPY DIAG:  Difficulty in walking, not elsewhere classified  Stiffness of left knee, not elsewhere classified  Other abnormalities of gait and mobility  Rationale for Evaluation and Treatment: Rehabilitation  ONSET DATE: L TKA 2022  SUBJECTIVE:   SUBJECTIVE  STATEMENT: Session limited due to pt late arrival. No pain or issues other than her ankle being a little sore today.  No known cause, possibly the exercises.  Reports mid compliance with HEP.    PERTINENT HISTORY: Diabetes, controlled by meds HTN, medicated PAIN:  Are you having pain? No  PRECAUTIONS: Fall  RED FLAGS: None   WEIGHT BEARING RESTRICTIONS: No  FALLS:  Has patient fallen in last 6 months? 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  DIAGNOSTIC FINDINGS: X-ray report  Chief complaint LEFT TKA 2022ATTUNE PS FB  Images 3  Reading: SIZING LOOKS APPROPRIATE  ALINGNMENT -3 TO 3+ WEAR NONE NOTICEABLE   Impression: NORMAL POST OP KNEE 3 YRS OUT   PATIENT SURVEYS:  LEFS : 37/80   COGNITION: Overall cognitive status: Within functional limits for tasks assessed     SENSATION: WFL, pt states she has some neuropathy in LLE but not as bad as in head   PALPATION: No tenderness to palpation  LOWER EXTREMITY ROM:  Active ROM Right eval Left eval  Hip flexion    Hip extension    Hip abduction    Hip adduction    Hip internal rotation    Hip external rotation    Knee flexion 120 118, no pain  Knee extension    Ankle dorsiflexion    Ankle plantarflexion    Ankle inversion    Ankle eversion     (Blank rows = not tested)  LOWER EXTREMITY MMT:  MMT Right eval Left eval  Hip flexion 4 3+  Hip extension 4 3+  Hip abduction 4 3  Hip adduction 4 3  Hip internal rotation    Hip external rotation     Knee flexion 4 3+  Knee extension 4 3+  Ankle dorsiflexion 4 3  Ankle plantarflexion    Ankle inversion    Ankle eversion     (Blank rows = not tested)   FUNCTIONAL TESTS:  5 times sit to stand: 23.61 seconds 2 minute walk test: 188 feet  GAIT: Distance walked: 280 feet Assistive device utilized: Quad cane small base Level of assistance: Modified independence Comments: pt demonstrates decreased left foot clearance about every 4th step, decreased left step length especially coming to the end of .  TREATMENT DATE:  09/07/23:  In parallel bars:  limited HHA Heel raises on incline 30x Toe Raises on decline 30x Marching 3# AW, 2x10 ea LE Hip abduction 3# AW 2X10 ea LE Side stepping with 3# AW 6RT Static balance on foam in // cone rotation 2RT to each side tandem stance, modified on foam with 1#cane UE flexion 10X each LE leading Sit to stands no UE assist 10X  09/02/23:  Heel raises on incline, at // bars, 30x Toe Raises on decline, at // bars, 30x Marching in // bars, 3 lb. AW, 2x10 ea LE Forward ambulation with R/L head turns, 20 ftx2, one mod-severe LOB, Max A to recover           Vertical head turns, 20 ftx2 Static balance w/ horizontal head turns, ball/cone balance, in // bars, keep eye gaze on ball, 10 passes from R/L hands           Prog to feet on foam, 10 passes           Prog tandem stance, on foot on foam and other on 8 inch step, 10x w/ each LE leading Side Steps, 3 lb. AW, in // bars, 6x  09/01/23: STS, at // bars, no UE support  10x feet on foam Forward foot taps, 4 inch step, 10x ea LE, no UE support  6 inch step, 20 taps ea LE, finger support bilat--> no UE support Cone taps, flexion and abduction, BUE-->one UE support, 10x each LE Side Steps, 10 ft line x 3, CGA Backward Ambulation, 10 ft, pt demo NBOS    08/26/23: Nustep  [place] UE/LE x 5 L3 resistance BERG  1. SITTING TO STANDING  4 able to stand without using hands and stabilize independently  2. STANDING UNSUPPORTED  4 able to stand safely for 2 minutes  If a subject is able to stand 2 minutes unsupported, score full points for sitting unsupported. Proceed to item #4  3. SITTING WITH BACK UNSUPPORTED BUT FEET SUPPORTED ON FLOOR OR ON A STOOL 4 able to sit safely and securely for 2 minutes  4. STANDING TO SITTING 4 sits safely with minimal use of hands  5. TRANSFERS 4 able to transfer safely with minor use of hands  6. STANDING UNSUPPORTED WITH EYES CLOSED  3 able to stand 10 seconds with supervision  7. STANDING UNSUPPORTED WITH FEET TOGETHER  3 able to place feet together independently and stand 1 minute with supervision  8. REACHING FORWARD WITH OUTSTRETCHED ARM WHILE STANDING 2 can reach forward 5 cm (2 inches  9. PICK UP OBJECT FROM THE FLOOR FROM A STANDING POSITION  3 able to pick up slipper but needs supervision  10. TURNING TO LOOK BEHIND OVER LEFT AND RIGHT SHOULDERS WHILE STANDING  3 looks behind one side only other side shows less weight shift  11. TURN 360 DEGREES 1 needs close supervision or verbal cuing  12. PLACE ALTERNATE FOOT ON STEP OR STOOL WHILE STANDING UNSUPPORTED  0 needs assistance to keep from falling/unable to try  13. STANDING UNSUPPORTED ONE FOOT IN FRONT 1 needs help to step but can hold 15 seconds  14. STANDING ON ONE LEG 0 unable to try of needs assist to prevent fall   TOTAL SCORE  36/56 21-40 = walking with assistance   Marching 10x 3 (required HHA for safety) SLS 3 max BLE  Rockerboard lateral weight shifting then Df/Pf 1' each Sidestep with RTB around thigh 2RT inside // bars with minimal  HHA    PATIENT EDUCATION:  Education details: Pt was educated on findings of PT evaluation, prognosis, frequency of therapy visits and rationale, attendance policy, and HEP if given.   Person  educated: Patient Education method: Explanation, Verbal cues, and Handouts Education comprehension: verbalized understanding, verbal cues required, and needs further education  HOME EXERCISE PROGRAM: 08/11/23 - tandem stance balance; try not to hold on  - 2 x daily - 7 x weekly - 1 sets - 5 reps - 30 sec hold - Clamshell  - 2 x daily - 7 x weekly - 2 sets - 10 reps  Access Code: [email or web address] URL: [email or web address] Date: 08/06/2023 Prepared by: Lang Ada  Exercises - Supine Bridge  - 1 x daily - 7 x weekly - 3 sets - 10 reps - Sit to Stand with Arms Crossed  - 1 x daily - 7 x weekly - 3 sets - 10 reps  ASSESSMENT:  CLINICAL IMPRESSION: Pt arrived late for session limiting session.  Continued focus on improving LE strength and stability.  Patient with intermittent rest breaks due to fatigue. Required CGA with standing rotation and UE static balance challenges to ensure safely.  More instabilities with rotation as tends to shift weight forward.  Verbal cueing required during side steps to reduce dragging LLE. Patient will benefit from continued skilled physical therapy in order to address strength, endurance, and balance to decrease risk of fall.  Eval:Patient is a 68 y.o. female who was seen today for physical therapy evaluation and treatment for Z96.652 (ICD-10-CM) - S/P total knee replacement, left M17.12 (ICD-10-CM) - Unilateral primary osteoarthritis, left knee R26.81 (ICD-10-CM) - Unsteady gait.  Patient demonstrates decreased LE strength, decreased functional mobility, and impaired balance. Patient also demonstrates difficulty with ambulation during today's session with decreased endurance, stride length and velocity noted. Patient also demonstrates WFL ROM of left knee compared to right. Patient requires increased time for STS transfer and ambulation. General LE strengthening and endurance training indicated due to left knee instability reported and decreased strength  observed throughout LLE. Patient would benefit from skilled physical therapy for increased endurance with ambulation, increased LLE strength, and balance for improved gait quality, return to higher level of function with ADLs, and progress towards therapy goals.   OBJECTIVE IMPAIRMENTS: Abnormal gait, decreased activity tolerance, decreased balance, decreased endurance, decreased mobility, difficulty walking, decreased ROM, decreased strength, and obesity.   ACTIVITY LIMITATIONS: carrying, lifting, bending, standing, squatting, stairs, transfers, bed mobility, bathing, and locomotion level  PARTICIPATION LIMITATIONS: meal prep, cleaning, laundry, shopping, community activity, and yard work  PERSONAL FACTORS: Age, Fitness, Time since onset of injury/illness/exacerbation, and 1-2 comorbidities: HTN, Diabetes are also affecting patient's functional outcome.   REHAB POTENTIAL: Fair chronic in nature  CLINICAL DECISION MAKING: Stable/uncomplicated  EVALUATION COMPLEXITY: Low   GOALS: Goals reviewed with patient? No  SHORT TERM GOALS: Target date: 08/20/23  Patient will demonstrate evidence of independence with individualized HEP and will report compliance for at least 3 days per week for optimized progression towards remaining therapy goals. Baseline:  Goal status: IN PROGRESS  2.  Patient will report a decrease knee buckling episodes to less than 5 times per week for improved functional mobility and decrease fall risk.  Baseline: several times Goal status: IN PROGRESS     LONG TERM GOALS: Target date: 09/10/23  Pt will demonstrate a an increase of at least 9 points on the LEFS for improved performance of community ambulation and ADL. Baseline: see objective Goal status: IN PROGRESS  2.  Pt will improve 2 MWT by 50 feet in order to demonstrate improved functional ambulatory capacity in community setting.  Baseline:  Goal status: IN PROGRESS   4.  Pt will demonstrate at least  4/5 MMT for L lower extremity for increased strength during ADL and community ambulation. Baseline:  Goal status: IN PROGRESS  5.  Pt will improve 5TSTS by at least 2.3 seconds in order to improve functional strength during functional transfers. Baseline:  Goal status: IN PROGRESS    PLAN:  PT FREQUENCY: 2x/week  PT DURATION: other: 5 weeks  PLANNED INTERVENTIONS: 97110-Therapeutic exercises, 97530- Therapeutic activity, 97112- Neuromuscular re-education, 97535- Self Care, [id number]- Manual therapy, [phone]- Gait training, Patient/Family education, Balance training, Stair training, Joint mobilization, and DME instructions  PLAN FOR NEXT SESSION:  progress LE, balance, progress endurance training    12:49 PM, 09/07/23 Rosaria Settler, PT, DPT [facility] [place]

## 2023-09-09 ENCOUNTER — Ambulatory Visit (HOSPITAL_COMMUNITY): Admitting: Physical Therapy

## 2023-09-09 DIAGNOSIS — R262 Difficulty in walking, not elsewhere classified: Secondary | ICD-10-CM

## 2023-09-09 DIAGNOSIS — M25662 Stiffness of left knee, not elsewhere classified: Secondary | ICD-10-CM

## 2023-09-09 DIAGNOSIS — R2689 Other abnormalities of gait and mobility: Secondary | ICD-10-CM

## 2023-09-09 DIAGNOSIS — G8929 Other chronic pain: Secondary | ICD-10-CM

## 2023-09-09 NOTE — Therapy (Signed)
 OUTPATIENT PHYSICAL THERAPY LOWER EXTREMITY TREATMENT/DISCHARGE  PHYSICAL THERAPY DISCHARGE SUMMARY  Visits from Start of Care: 7  Current functional level related to goals / functional outcomes: WFL, was not able to meet goal.   Remaining deficits: Walking speed/endurance   Education / Equipment: Pt educated on importance of consistent exercise and HEP compliance.    Patient agrees to discharge. Patient goals were partially met. Patient is being discharged due to being pleased with the current functional level.  Patient Name: Pamela Stein MRN: [id number] DOB:09-29-1955, 68 y.o., female Today's Date: 09/09/2023  END OF SESSION:  PT End of Session - 09/09/23 1303     Visit Number 8    Number of Visits 10    Date for PT Re-Evaluation 09/10/23    Authorization Type [employer] P[employer]    Authorization Time Period no auth required    Progress Note Due on Visit 10    PT Start Time 1020    PT Stop Time 1055    PT Time Calculation (min) 35 min    Equipment Utilized During Treatment Gait belt    Activity Tolerance Patient tolerated treatment well    Behavior During Therapy WFL for tasks assessed/performed            Past Medical History:  Diagnosis Date   Anxiety    Back pain    Bronchitis    Diabetes mellitus    Dizziness    Heart murmur    History of gout    Hyperlipidemia    Hypertension    Neuropathy    Obesity    Obstructive sleep apnea    CPAP   Past Surgical History:  Procedure Laterality Date   ABDOMINAL HYSTERECTOMY     [facility]   ADENOIDECTOMY     CATARACT EXTRACTION Left    right 02/2018   COLONOSCOPY  2008   diminutive rectal polyp, s/p removal. polypoid mucosa   COLONOSCOPY WITH PROPOFOL  N/A 01/03/2018   Procedure: COLONOSCOPY WITH PROPOFOL ;  Surgeon: Shaaron Lamar HERO, MD;  Location: [facility];  Service: Endoscopy;  Laterality: N/A;  12:00pm   DILATION AND CURETTAGE OF UTERUS     ENDOMETRIAL ABLATION     EYE SURGERY Bilateral  12/04/2013   cataract   PARATHYROIDECTOMY N/A 12/22/2016   Procedure: PARATHYROIDECTOMY, NECK EXPLORATION;  Surgeon: Lily Boas, MD;  Location: [facility];  Service: General;  Laterality: N/A;   TONSILLECTOMY     TOTAL KNEE ARTHROPLASTY Left 05/28/2020   Procedure: LEFT TOTAL KNEE ARTHROPLASTY;  Surgeon: Margrette Taft BRAVO, MD;  Location: [facility];  Service: Orthopedics;  Laterality: Left;   WOUND EXPLORATION Left 05/31/2020   Procedure: WOUND EXPLORATION AND IRRIGATION LEFT KNEE;  Surgeon: Margrette Taft BRAVO, MD;  Location: [facility];  Service: Orthopedics;  Laterality: Left;  irrigation wound exploration, wound closure   Patient Active Problem List   Diagnosis Date Noted   S/P abdominal supracervical subtotal hysterectomy 10/08/2020   Encounter for screening fecal occult blood testing 10/08/2020   Encounter for gynecological examination with Papanicolaou smear of cervix 10/08/2020   Chronic pain of left knee 05/31/2020   Wound dehiscence, surgical    Status post total left knee replacement 05/28/2020   Primary osteoarthritis of left knee    Polyneuropathy due to secondary diabetes mellitus (HCC) 05/25/2019   Arthritis of right sacroiliac joint (HCC) 05/25/2019   Cervical disc disorder 05/25/2019   Falls 05/25/2019   General unsteadiness 05/25/2019   Encounter for well woman exam with routine  gynecological exam 01/09/2019   Screening for colorectal cancer 01/09/2019   Encounter for screening colonoscopy 12/13/2017   Hyperparathyroidism, primary (HCC) 12/22/2016   Vitamin D  deficiency 10/31/2015   Hyperuricemia 05/08/2013   ONYCHOMYCOSIS, TOENAILS 02/22/2009   Benign neoplasm of skin 02/22/2009   Diabetes mellitus (HCC) 01/25/2008   BACK PAIN WITH RADICULOPATHY 11/09/2007   Hyperlipidemia LDL goal <100 08/26/2007   Morbid obesity (HCC) 08/26/2007   Essential hypertension 08/26/2007   Obstructive sleep apnea 04/20/2007    PCP: Carlette Benita Area, MD   REFERRING PROVIDER:  Margrette Taft BRAVO, MD  REFERRING DIAG: [phone] (ICD-10-CM) - S/P total knee replacement, left M17.12 (ICD-10-CM) - Unilateral primary osteoarthritis, left knee R26.81 (ICD-10-CM) - Unsteady gait  THERAPY DIAG:  Difficulty in walking, not elsewhere classified  Stiffness of left knee, not elsewhere classified  Other abnormalities of gait and mobility  Chronic pain of left knee  Rationale for Evaluation and Treatment: Rehabilitation  ONSET DATE: L TKA 2022  SUBJECTIVE:   SUBJECTIVE STATEMENT: Session limited due to pt late arrival. No pain or issues other than her ankle being a little sore today.  No known cause, possibly the exercises.  Reports mid compliance with HEP.    PERTINENT HISTORY: Diabetes, controlled by meds HTN, medicated PAIN:  Are you having pain? No  PRECAUTIONS: Fall  RED FLAGS: None   WEIGHT BEARING RESTRICTIONS: No  FALLS:  Has patient fallen in last 6 months? 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  DIAGNOSTIC FINDINGS: X-ray report  Chief complaint LEFT TKA 2022ATTUNE PS FB  Images 3  Reading: SIZING LOOKS APPROPRIATE  ALINGNMENT -3 TO 3+ WEAR NONE NOTICEABLE   Impression: NORMAL POST OP KNEE 3 YRS OUT   PATIENT SURVEYS:  LEFS : 09/09/23:  55 / 80 = 68.8 % (was 37/80 at evaluation)  COGNITION: Overall cognitive status: Within functional limits for tasks assessed     SENSATION: WFL, pt states she has some neuropathy in LLE but not as bad as in head   PALPATION: No  tenderness to palpation  LOWER EXTREMITY ROM:  Active ROM Right eval Left eval  Hip flexion    Hip extension    Hip abduction    Hip adduction    Hip internal rotation    Hip external rotation    Knee flexion 120 118, no pain  Knee extension    Ankle dorsiflexion    Ankle plantarflexion    Ankle inversion    Ankle eversion     (Blank rows = not tested)  LOWER EXTREMITY MMT:  MMT Right eval Left eval Right 09/09/23 Left 09/09/23  Hip flexion 4 3+ 5 5  Hip extension 4 3+    Hip abduction 4 3 3- tested in SL 4+ tested in seated iso 3- tested in SL 4+ tested in seated iso  Hip adduction 4 3 3- tested in SL 4+ tested in seated iso 3- tested in SL 4+ tested in seated iso  Hip internal rotation      Hip external rotation  Knee flexion 4 3+ 5 5  Knee extension 4 3+ 5 5  Ankle dorsiflexion 4 3    Ankle plantarflexion      Ankle inversion      Ankle eversion       (Blank rows = not tested)   FUNCTIONAL TESTS:  5 times sit to stand: 09/09/23: 16.3 sec (was 23.61 seconds at evaluation) 2 minute walk test: 09/09/23: 214 ft with QC  (was 188 feet at evaluation)  GAIT: Distance walked: 280 feet Assistive device utilized: Quad cane small base Level of assistance: Modified independence Comments: pt demonstrates decreased left foot clearance about every 4th step, decreased left step length especially coming to the end of .                                                                                                                                TREATMENT DATE:  09/09/23 Nustep level 3, 5 minutes warmup Progress note Functional tests:  MMT (see above) 5 times sit to stand: 09/09/23: 16.3 sec (was 23.61 seconds at evaluation) 2 minute walk test: 09/09/23: 214 ft with QC  (was 188 feet at evaluation) LEFS : 09/09/23:  55 / 80 = 68.8 % (was 37/80 at evaluation) HEP review; discharge instructions  09/07/23:  In parallel bars:  limited HHA Heel raises on incline  30x Toe Raises on decline 30x Marching 3# AW, 2x10 ea LE Hip abduction 3# AW 2X10 ea LE Side stepping with 3# AW 6RT Static balance on foam in // cone rotation 2RT to each side tandem stance, modified on foam with 1#cane UE flexion 10X each LE leading Sit to stands no UE assist 10X  09/02/23:  Heel raises on incline, at // bars, 30x Toe Raises on decline, at // bars, 30x Marching in // bars, 3 lb. AW, 2x10 ea LE Forward ambulation with R/L head turns, 20 ftx2, one mod-severe LOB, Max A to recover           Vertical head turns, 20 ftx2 Static balance w/ horizontal head turns, ball/cone balance, in // bars, keep eye gaze on ball, 10 passes from R/L hands           Prog to feet on foam, 10 passes           Prog tandem stance, on foot on foam and other on 8 inch step, 10x w/ each LE leading Side Steps, 3 lb. AW, in // bars, 6x  09/01/23: STS, at // bars, no UE support  10x feet on foam Forward foot taps, 4 inch step, 10x ea LE, no UE support  6 inch step, 20 taps ea LE, finger support bilat--> no UE support Cone taps, flexion and abduction, BUE-->one UE support, 10x each LE Side Steps, 10 ft line x 3, CGA Backward Ambulation, 10 ft, pt demo NBOS    08/26/23: Nustep [place] UE/LE x 5 L3 resistance BERG  1. SITTING TO STANDING  4 able to stand without using hands and stabilize independently  2. STANDING UNSUPPORTED  4 able to stand safely for 2 minutes  If a subject is able to stand 2 minutes unsupported, score full points for sitting unsupported. Proceed to item #4  3. SITTING WITH BACK UNSUPPORTED BUT FEET SUPPORTED ON FLOOR OR ON A STOOL 4 able to sit safely and securely for 2 minutes  4. STANDING TO SITTING 4 sits safely with minimal use of hands  5. TRANSFERS 4 able to transfer safely with minor use of hands  6. STANDING UNSUPPORTED WITH EYES CLOSED  3 able to stand 10 seconds with supervision  7. STANDING UNSUPPORTED WITH FEET TOGETHER  3 able to place feet  together independently and stand 1 minute with supervision  8. REACHING FORWARD WITH OUTSTRETCHED ARM WHILE STANDING 2 can reach forward 5 cm (2 inches  9. PICK UP OBJECT FROM THE FLOOR FROM A STANDING POSITION  3 able to pick up slipper but needs supervision  10. TURNING TO LOOK BEHIND OVER LEFT AND RIGHT SHOULDERS WHILE STANDING  3 looks behind one side only other side shows less weight shift  11. TURN 360 DEGREES 1 needs close supervision or verbal cuing  12. PLACE ALTERNATE FOOT ON STEP OR STOOL WHILE STANDING UNSUPPORTED  0 needs assistance to keep from falling/unable to try  13. STANDING UNSUPPORTED ONE FOOT IN FRONT 1 needs help to step but can hold 15 seconds  14. STANDING ON ONE LEG 0 unable to try of needs assist to prevent fall   TOTAL SCORE  36/56 21-40 = walking with assistance   Marching 10x 3 (required HHA for safety) SLS 3 max BLE  Rockerboard lateral weight shifting then Df/Pf 1' each Sidestep with RTB around thigh 2RT inside // bars with minimal HHA    PATIENT EDUCATION:  Education details: Pt was educated on findings of PT evaluation, prognosis, frequency of therapy visits and rationale, attendance policy, and HEP if given.   Person educated: Patient Education method: Explanation, Verbal cues, and Handouts Education comprehension: verbalized understanding, verbal cues required, and needs further education  HOME EXERCISE PROGRAM: 08/11/23 - tandem stance balance; try not to hold on  - 2 x daily - 7 x weekly - 1 sets - 5 reps - 30 sec hold - Clamshell  - 2 x daily - 7 x weekly - 2 sets - 10 reps  Access Code: [email or web address] URL: [email or web address] Date: 08/06/2023 Prepared by: Lang Ada  Exercises - Supine Bridge  - 1 x daily - 7 x weekly - 3 sets - 10 reps - Sit to Stand with Arms Crossed  - 1 x daily - 7 x weekly - 3 sets - 10 reps  ASSESSMENT:  CLINICAL IMPRESSION: Progress note completed today with all goals met with  exception of 2 minute walk test goal. Pt was only 24 foot short of meeting this goal.  Pt's LE no longer gives way and feels overall stronger and more stable.   Pt verbalized she is ready for discharge and intends on going by the [facility] to begin water  aerobics there.  Pt without any questions regarding her HEP or have any further needs.    Patient is a 68 y.o. female who was seen today for physical therapy evaluation and treatment for Z96.652 (ICD-10-CM) - S/P total knee replacement, left M17.12 (ICD-10-CM) - Unilateral primary osteoarthritis, left knee R26.81 (ICD-10-CM) - Unsteady gait.  Patient demonstrates decreased LE strength, decreased functional mobility, and impaired balance.  Patient also demonstrates difficulty with ambulation during today's session with decreased endurance, stride length and velocity noted. Patient also demonstrates WFL ROM of left knee compared to right. Patient requires increased time for STS transfer and ambulation. General LE strengthening and endurance training indicated due to left knee instability reported and decreased strength observed throughout LLE. Patient would benefit from skilled physical therapy for increased endurance with ambulation, increased LLE strength, and balance for improved gait quality, return to higher level of function with ADLs, and progress towards therapy goals.   OBJECTIVE IMPAIRMENTS: Abnormal gait, decreased activity tolerance, decreased balance, decreased endurance, decreased mobility, difficulty walking, decreased ROM, decreased strength, and obesity.   ACTIVITY LIMITATIONS: carrying, lifting, bending, standing, squatting, stairs, transfers, bed mobility, bathing, and locomotion level  PARTICIPATION LIMITATIONS: meal prep, cleaning, laundry, shopping, community activity, and yard work  PERSONAL FACTORS: Age, Fitness, Time since onset of injury/illness/exacerbation, and 1-2 comorbidities: HTN, Diabetes are also affecting patient's functional  outcome.   REHAB POTENTIAL: Fair chronic in nature  CLINICAL DECISION MAKING: Stable/uncomplicated  EVALUATION COMPLEXITY: Low   GOALS: Goals reviewed with patient? Yes  SHORT TERM GOALS: Target date: 08/20/23  Patient will demonstrate evidence of independence with individualized HEP and will report compliance for at least 3 days per week for optimized progression towards remaining therapy goals. Baseline:  Goal status: MET  2.  Patient will report a decrease knee buckling episodes to less than 5 times per week for improved functional mobility and decrease fall risk.  Baseline: several times Goal status: MET     LONG TERM GOALS: Target date: 09/10/23  Pt will demonstrate a an increase of at least 9 points on the LEFS for improved performance of community ambulation and ADL. Baseline: see objective Goal status: MET  2.  Pt will improve 2 MWT by 50 feet in order to demonstrate improved functional ambulatory capacity in community setting.  Baseline:  Goal status: NOT MET (24 ft shy of goal)   4.  Pt will demonstrate at least 4/5 MMT for L lower extremity for increased strength during ADL and community ambulation. Baseline:  Goal status: MET as tested at evaluation with hip abd/add in seated  5.  Pt will improve 5TSTS by at least 2.3 seconds in order to improve functional strength during functional transfers. Baseline:  Goal status: MET    PLAN:  PT FREQUENCY: 2x/week  PT DURATION: other: 5 weeks  PLANNED INTERVENTIONS: 97110-Therapeutic exercises, 97530- Therapeutic activity, 97112- Neuromuscular re-education, 97535- Self Care, [id number]- Manual therapy, [phone]- Gait training, Patient/Family education, Balance training, Stair training, Joint mobilization, and DME instructions  PLAN FOR NEXT SESSION:  discharge to HEP  1:04 PM, 09/09/23  Greig KATHEE Fuse, PTA/CLT [facility] Ph: [phone]  Lang Ada, PT, DPT [facility] Office: [phone] 10:13 AM, 09/10/23

## 2023-09-13 ENCOUNTER — Encounter (HOSPITAL_COMMUNITY)

## 2023-09-16 ENCOUNTER — Encounter (HOSPITAL_COMMUNITY)

## 2023-09-20 ENCOUNTER — Encounter (HOSPITAL_COMMUNITY)

## 2023-09-23 ENCOUNTER — Encounter (HOSPITAL_COMMUNITY): Admitting: Physical Therapy

## 2024-01-13 ENCOUNTER — Other Ambulatory Visit (HOSPITAL_COMMUNITY)
Admission: RE | Admit: 2024-01-13 | Discharge: 2024-01-13 | Disposition: A | Source: Ambulatory Visit | Attending: Internal Medicine | Admitting: Internal Medicine

## 2024-01-13 LAB — BASIC METABOLIC PANEL WITH GFR
Anion gap: 11 (ref 5–15)
BUN: 21 mg/dL (ref 8–23)
CO2: 26 mmol/L (ref 22–32)
Calcium: 11.4 mg/dL — ABNORMAL HIGH (ref 8.9–10.3)
Chloride: 102 mmol/L (ref 98–111)
Creatinine, Ser: 1.27 mg/dL — ABNORMAL HIGH (ref 0.44–1.00)
GFR, Estimated: 45 mL/min — ABNORMAL LOW (ref >60)
Glucose, Bld: 120 mg/dL — ABNORMAL HIGH (ref 70–99)
Potassium: 4.9 mmol/L (ref 3.5–5.1)
Sodium: 138 mmol/L (ref 135–145)

## 2024-01-13 LAB — CBC WITH DIFFERENTIAL/PLATELET
Abs Immature Granulocytes: 0.07 K/uL (ref 0.00–0.07)
Basophils Absolute: 0.1 K/uL (ref 0.0–0.1)
Basophils Relative: 1 %
Eosinophils Absolute: 0.2 K/uL (ref 0.0–0.5)
Eosinophils Relative: 1 %
HCT: 43.2 % (ref 36.0–46.0)
Hemoglobin: 13.5 g/dL (ref 12.0–15.0)
Immature Granulocytes: 1 %
Lymphocytes Relative: 35 %
Lymphs Abs: 4.3 K/uL — ABNORMAL HIGH (ref 0.7–4.0)
MCH: 21.6 pg — ABNORMAL LOW (ref 26.0–34.0)
MCHC: 31.3 g/dL (ref 30.0–36.0)
MCV: 69.1 fL — ABNORMAL LOW (ref 80.0–100.0)
Monocytes Absolute: 0.9 K/uL (ref 0.1–1.0)
Monocytes Relative: 8 %
Neutro Abs: 6.9 K/uL (ref 1.7–7.7)
Neutrophils Relative %: 54 %
Platelets: 386 K/uL (ref 150–400)
RBC: 6.25 MIL/uL — ABNORMAL HIGH (ref 3.87–5.11)
RDW: 20.9 % — ABNORMAL HIGH (ref 11.5–15.5)
Smear Review: NORMAL
WBC: 12.4 K/uL — ABNORMAL HIGH (ref 4.0–10.5)
nRBC: 0 % (ref 0.0–0.2)

## 2024-01-13 LAB — HEMOGLOBIN A1C
Hgb A1c MFr Bld: 8.1 % — ABNORMAL HIGH (ref 4.8–5.6)
Mean Plasma Glucose: 185.77 mg/dL

## 2024-01-13 LAB — LIPID PANEL
Cholesterol: 319 mg/dL — ABNORMAL HIGH (ref 0–200)
HDL: 44 mg/dL (ref >40)
LDL Cholesterol: 228 mg/dL — ABNORMAL HIGH (ref 0–99)
Total CHOL/HDL Ratio: 7.2 ratio
Triglycerides: 236 mg/dL — ABNORMAL HIGH (ref <150)
VLDL: 47 mg/dL — ABNORMAL HIGH (ref 0–40)

## 2024-01-13 LAB — HEPATIC FUNCTION PANEL
ALT: 17 U/L (ref 0–44)
AST: 16 U/L (ref 15–41)
Albumin: 4.6 g/dL (ref 3.5–5.0)
Alkaline Phosphatase: 129 U/L — ABNORMAL HIGH (ref 38–126)
Bilirubin, Direct: 0.2 mg/dL (ref 0.0–0.2)
Indirect Bilirubin: 0.3 mg/dL (ref 0.3–0.9)
Total Bilirubin: 0.5 mg/dL (ref 0.0–1.2)
Total Protein: 8.6 g/dL — ABNORMAL HIGH (ref 6.5–8.1)

## 2024-01-14 LAB — MICROALBUMIN / CREATININE URINE RATIO
Creatinine, Urine: 140.8 mg/dL
Microalb Creat Ratio: 32 mg/g{creat} — ABNORMAL HIGH (ref 0–29)
Microalb, Ur: 45 ug/mL — ABNORMAL HIGH

## 2024-04-17 ENCOUNTER — Encounter: Admitting: Adult Health

## 2024-05-29 ENCOUNTER — Ambulatory Visit: Admitting: Orthopedic Surgery
